# Patient Record
Sex: Female | Born: 1935 | Race: Black or African American | Hispanic: No | Smoking: Never smoker
Health system: Southern US, Community
[De-identification: ages and names within clinical notes are randomized; demographics above are authoritative.]

## PROBLEM LIST (undated history)

## (undated) DIAGNOSIS — K219 Gastro-esophageal reflux disease without esophagitis: Secondary | ICD-10-CM

## (undated) DIAGNOSIS — M6281 Muscle weakness (generalized): Secondary | ICD-10-CM

## (undated) DIAGNOSIS — I10 Essential (primary) hypertension: Secondary | ICD-10-CM

## (undated) DIAGNOSIS — F419 Anxiety disorder, unspecified: Secondary | ICD-10-CM

## (undated) DIAGNOSIS — M1990 Unspecified osteoarthritis, unspecified site: Secondary | ICD-10-CM

## (undated) DIAGNOSIS — R55 Syncope and collapse: Secondary | ICD-10-CM

## (undated) DIAGNOSIS — F0390 Unspecified dementia without behavioral disturbance: Secondary | ICD-10-CM

## (undated) DIAGNOSIS — I219 Acute myocardial infarction, unspecified: Secondary | ICD-10-CM

## (undated) DIAGNOSIS — L0390 Cellulitis, unspecified: Secondary | ICD-10-CM

## (undated) DIAGNOSIS — E785 Hyperlipidemia, unspecified: Secondary | ICD-10-CM

## (undated) DIAGNOSIS — H409 Unspecified glaucoma: Secondary | ICD-10-CM

## (undated) DIAGNOSIS — M109 Gout, unspecified: Secondary | ICD-10-CM

## (undated) DIAGNOSIS — R269 Unspecified abnormalities of gait and mobility: Secondary | ICD-10-CM

## (undated) DIAGNOSIS — I509 Heart failure, unspecified: Secondary | ICD-10-CM

## (undated) DIAGNOSIS — R1310 Dysphagia, unspecified: Secondary | ICD-10-CM

## (undated) DIAGNOSIS — N189 Chronic kidney disease, unspecified: Secondary | ICD-10-CM

## (undated) HISTORY — PX: APPENDECTOMY: SHX54

## (undated) HISTORY — DX: Unspecified abnormalities of gait and mobility: R26.9

## (undated) HISTORY — DX: Chronic kidney disease, unspecified: N18.9

## (undated) HISTORY — DX: Cellulitis, unspecified: L03.90

## (undated) HISTORY — DX: Dysphagia, unspecified: R13.10

## (undated) HISTORY — DX: Heart failure, unspecified: I50.9

## (undated) HISTORY — PX: TONSILLECTOMY: SUR1361

## (undated) HISTORY — DX: Unspecified glaucoma: H40.9

## (undated) HISTORY — DX: Gastro-esophageal reflux disease without esophagitis: K21.9

## (undated) HISTORY — DX: Hyperlipidemia, unspecified: E78.5

## (undated) HISTORY — DX: Gout, unspecified: M10.9

## (undated) HISTORY — DX: Muscle weakness (generalized): M62.81

## (undated) HISTORY — DX: Anxiety disorder, unspecified: F41.9

## (undated) HISTORY — DX: Unspecified osteoarthritis, unspecified site: M19.90

## (undated) HISTORY — DX: Syncope and collapse: R55

## (undated) HISTORY — DX: Essential (primary) hypertension: I10

## (undated) HISTORY — DX: Unspecified dementia, unspecified severity, without behavioral disturbance, psychotic disturbance, mood disturbance, and anxiety: F03.90

---

## 2001-01-12 ENCOUNTER — Ambulatory Visit (HOSPITAL_COMMUNITY): Admission: RE | Admit: 2001-01-12 | Discharge: 2001-01-12 | Payer: Self-pay | Admitting: Family Medicine

## 2001-01-12 ENCOUNTER — Encounter: Payer: Self-pay | Admitting: Family Medicine

## 2001-03-13 ENCOUNTER — Ambulatory Visit (HOSPITAL_COMMUNITY): Admission: RE | Admit: 2001-03-13 | Discharge: 2001-03-13 | Payer: Self-pay | Admitting: Family Medicine

## 2001-03-13 ENCOUNTER — Encounter: Payer: Self-pay | Admitting: Family Medicine

## 2002-01-22 ENCOUNTER — Ambulatory Visit (HOSPITAL_COMMUNITY): Admission: RE | Admit: 2002-01-22 | Discharge: 2002-01-22 | Payer: Self-pay | Admitting: Family Medicine

## 2002-01-22 ENCOUNTER — Encounter: Payer: Self-pay | Admitting: Family Medicine

## 2002-11-19 ENCOUNTER — Ambulatory Visit (HOSPITAL_COMMUNITY): Admission: RE | Admit: 2002-11-19 | Discharge: 2002-11-19 | Payer: Self-pay | Admitting: Family Medicine

## 2002-11-19 ENCOUNTER — Encounter: Payer: Self-pay | Admitting: Family Medicine

## 2002-12-20 ENCOUNTER — Encounter: Payer: Self-pay | Admitting: Internal Medicine

## 2002-12-21 ENCOUNTER — Inpatient Hospital Stay (HOSPITAL_COMMUNITY): Admission: EM | Admit: 2002-12-21 | Discharge: 2002-12-26 | Payer: Self-pay | Admitting: Emergency Medicine

## 2003-03-12 ENCOUNTER — Inpatient Hospital Stay (HOSPITAL_COMMUNITY): Admission: AD | Admit: 2003-03-12 | Discharge: 2003-03-14 | Payer: Self-pay | Admitting: Internal Medicine

## 2003-03-12 ENCOUNTER — Encounter: Payer: Self-pay | Admitting: Internal Medicine

## 2003-07-14 ENCOUNTER — Ambulatory Visit (HOSPITAL_COMMUNITY): Admission: RE | Admit: 2003-07-14 | Discharge: 2003-07-14 | Payer: Self-pay | Admitting: Family Medicine

## 2003-12-03 ENCOUNTER — Ambulatory Visit (HOSPITAL_COMMUNITY): Admission: RE | Admit: 2003-12-03 | Discharge: 2003-12-03 | Payer: Self-pay | Admitting: Family Medicine

## 2003-12-30 ENCOUNTER — Ambulatory Visit (HOSPITAL_COMMUNITY): Admission: RE | Admit: 2003-12-30 | Discharge: 2003-12-30 | Payer: Self-pay | Admitting: Neurology

## 2004-02-24 ENCOUNTER — Inpatient Hospital Stay (HOSPITAL_COMMUNITY): Admission: EM | Admit: 2004-02-24 | Discharge: 2004-02-27 | Payer: Self-pay | Admitting: Emergency Medicine

## 2004-07-08 ENCOUNTER — Ambulatory Visit: Payer: Self-pay | Admitting: Family Medicine

## 2004-07-15 ENCOUNTER — Ambulatory Visit (HOSPITAL_COMMUNITY): Admission: RE | Admit: 2004-07-15 | Discharge: 2004-07-15 | Payer: Self-pay | Admitting: Family Medicine

## 2004-09-20 ENCOUNTER — Ambulatory Visit: Payer: Self-pay | Admitting: Family Medicine

## 2005-01-10 ENCOUNTER — Ambulatory Visit: Payer: Self-pay | Admitting: Family Medicine

## 2005-01-25 ENCOUNTER — Emergency Department (HOSPITAL_COMMUNITY): Admission: EM | Admit: 2005-01-25 | Discharge: 2005-01-25 | Payer: Self-pay | Admitting: Emergency Medicine

## 2005-02-14 ENCOUNTER — Ambulatory Visit: Payer: Self-pay | Admitting: Family Medicine

## 2005-02-14 ENCOUNTER — Ambulatory Visit (HOSPITAL_COMMUNITY): Admission: RE | Admit: 2005-02-14 | Discharge: 2005-02-14 | Payer: Self-pay | Admitting: Family Medicine

## 2005-02-23 ENCOUNTER — Ambulatory Visit: Payer: Self-pay | Admitting: *Deleted

## 2005-03-02 ENCOUNTER — Ambulatory Visit (HOSPITAL_COMMUNITY): Admission: RE | Admit: 2005-03-02 | Discharge: 2005-03-02 | Payer: Self-pay | Admitting: *Deleted

## 2005-03-02 ENCOUNTER — Ambulatory Visit: Payer: Self-pay | Admitting: Cardiology

## 2005-03-03 ENCOUNTER — Ambulatory Visit: Payer: Self-pay | Admitting: *Deleted

## 2005-03-10 ENCOUNTER — Ambulatory Visit: Payer: Self-pay | Admitting: Family Medicine

## 2005-03-21 ENCOUNTER — Ambulatory Visit: Payer: Self-pay | Admitting: Family Medicine

## 2005-03-28 ENCOUNTER — Ambulatory Visit: Payer: Self-pay | Admitting: *Deleted

## 2005-03-30 ENCOUNTER — Ambulatory Visit: Payer: Self-pay | Admitting: *Deleted

## 2005-03-30 ENCOUNTER — Ambulatory Visit (HOSPITAL_COMMUNITY): Admission: RE | Admit: 2005-03-30 | Discharge: 2005-03-30 | Payer: Self-pay | Admitting: *Deleted

## 2005-04-07 ENCOUNTER — Ambulatory Visit: Payer: Self-pay | Admitting: *Deleted

## 2005-05-20 ENCOUNTER — Ambulatory Visit: Payer: Self-pay | Admitting: Family Medicine

## 2005-06-20 ENCOUNTER — Observation Stay (HOSPITAL_COMMUNITY): Admission: EM | Admit: 2005-06-20 | Discharge: 2005-06-22 | Payer: Self-pay | Admitting: Emergency Medicine

## 2005-06-27 ENCOUNTER — Ambulatory Visit: Payer: Self-pay | Admitting: Family Medicine

## 2005-06-29 ENCOUNTER — Ambulatory Visit: Payer: Self-pay | Admitting: Family Medicine

## 2005-07-04 ENCOUNTER — Ambulatory Visit: Payer: Self-pay | Admitting: Family Medicine

## 2005-07-11 ENCOUNTER — Ambulatory Visit: Payer: Self-pay | Admitting: Family Medicine

## 2005-07-11 ENCOUNTER — Observation Stay (HOSPITAL_COMMUNITY): Admission: EM | Admit: 2005-07-11 | Discharge: 2005-07-13 | Payer: Self-pay | Admitting: Emergency Medicine

## 2005-07-29 ENCOUNTER — Ambulatory Visit: Payer: Self-pay | Admitting: Family Medicine

## 2005-08-30 ENCOUNTER — Ambulatory Visit: Payer: Self-pay | Admitting: Family Medicine

## 2005-09-05 ENCOUNTER — Ambulatory Visit (HOSPITAL_COMMUNITY): Admission: RE | Admit: 2005-09-05 | Discharge: 2005-09-05 | Payer: Self-pay | Admitting: Family Medicine

## 2005-10-06 ENCOUNTER — Encounter (INDEPENDENT_AMBULATORY_CARE_PROVIDER_SITE_OTHER): Payer: Self-pay | Admitting: General Surgery

## 2005-10-06 ENCOUNTER — Ambulatory Visit (HOSPITAL_COMMUNITY): Admission: RE | Admit: 2005-10-06 | Discharge: 2005-10-06 | Payer: Self-pay | Admitting: General Surgery

## 2005-10-22 ENCOUNTER — Ambulatory Visit: Payer: Self-pay | Admitting: *Deleted

## 2005-10-22 ENCOUNTER — Inpatient Hospital Stay (HOSPITAL_COMMUNITY): Admission: EM | Admit: 2005-10-22 | Discharge: 2005-10-25 | Payer: Self-pay | Admitting: Emergency Medicine

## 2005-11-10 ENCOUNTER — Ambulatory Visit: Payer: Self-pay | Admitting: Family Medicine

## 2005-11-14 ENCOUNTER — Ambulatory Visit: Payer: Self-pay | Admitting: Family Medicine

## 2005-11-16 ENCOUNTER — Ambulatory Visit: Payer: Self-pay | Admitting: Family Medicine

## 2005-11-18 ENCOUNTER — Ambulatory Visit: Payer: Self-pay | Admitting: Family Medicine

## 2005-11-21 ENCOUNTER — Ambulatory Visit: Payer: Self-pay | Admitting: Family Medicine

## 2005-11-23 ENCOUNTER — Ambulatory Visit: Payer: Self-pay | Admitting: Family Medicine

## 2005-11-25 ENCOUNTER — Ambulatory Visit: Payer: Self-pay | Admitting: Family Medicine

## 2005-12-05 ENCOUNTER — Ambulatory Visit (HOSPITAL_COMMUNITY): Admission: RE | Admit: 2005-12-05 | Discharge: 2005-12-05 | Payer: Self-pay | Admitting: Nephrology

## 2005-12-13 ENCOUNTER — Ambulatory Visit: Payer: Self-pay | Admitting: Family Medicine

## 2006-01-03 ENCOUNTER — Inpatient Hospital Stay (HOSPITAL_COMMUNITY): Admission: EM | Admit: 2006-01-03 | Discharge: 2006-01-06 | Payer: Self-pay | Admitting: Emergency Medicine

## 2006-01-03 ENCOUNTER — Ambulatory Visit: Payer: Self-pay | Admitting: *Deleted

## 2006-01-24 ENCOUNTER — Ambulatory Visit: Payer: Self-pay | Admitting: Family Medicine

## 2006-03-08 ENCOUNTER — Ambulatory Visit: Payer: Self-pay | Admitting: Family Medicine

## 2006-03-08 ENCOUNTER — Other Ambulatory Visit: Admission: RE | Admit: 2006-03-08 | Discharge: 2006-03-08 | Payer: Self-pay | Admitting: Family Medicine

## 2006-04-26 ENCOUNTER — Ambulatory Visit: Payer: Self-pay | Admitting: Family Medicine

## 2006-06-06 ENCOUNTER — Ambulatory Visit: Payer: Self-pay | Admitting: Family Medicine

## 2006-06-14 ENCOUNTER — Ambulatory Visit: Payer: Self-pay | Admitting: Family Medicine

## 2006-06-28 ENCOUNTER — Ambulatory Visit: Payer: Self-pay | Admitting: Family Medicine

## 2006-09-01 ENCOUNTER — Ambulatory Visit: Payer: Self-pay | Admitting: Family Medicine

## 2006-09-12 ENCOUNTER — Ambulatory Visit: Payer: Self-pay | Admitting: Family Medicine

## 2006-10-06 ENCOUNTER — Emergency Department (HOSPITAL_COMMUNITY): Admission: EM | Admit: 2006-10-06 | Discharge: 2006-10-06 | Payer: Self-pay | Admitting: Emergency Medicine

## 2006-10-18 ENCOUNTER — Ambulatory Visit: Payer: Self-pay | Admitting: Family Medicine

## 2006-11-30 ENCOUNTER — Ambulatory Visit: Payer: Self-pay | Admitting: Family Medicine

## 2006-12-05 ENCOUNTER — Ambulatory Visit (HOSPITAL_COMMUNITY): Admission: RE | Admit: 2006-12-05 | Discharge: 2006-12-05 | Payer: Self-pay | Admitting: Family Medicine

## 2006-12-19 ENCOUNTER — Ambulatory Visit: Payer: Self-pay | Admitting: Family Medicine

## 2007-01-02 ENCOUNTER — Ambulatory Visit: Payer: Self-pay | Admitting: Family Medicine

## 2007-01-30 ENCOUNTER — Ambulatory Visit: Payer: Self-pay | Admitting: Family Medicine

## 2007-01-30 LAB — HM MAMMOGRAPHY

## 2007-03-06 ENCOUNTER — Ambulatory Visit: Payer: Self-pay | Admitting: Family Medicine

## 2007-05-01 ENCOUNTER — Ambulatory Visit: Payer: Self-pay | Admitting: Family Medicine

## 2007-05-07 ENCOUNTER — Ambulatory Visit: Payer: Self-pay | Admitting: Family Medicine

## 2007-05-21 ENCOUNTER — Ambulatory Visit: Payer: Self-pay | Admitting: Family Medicine

## 2007-06-11 ENCOUNTER — Ambulatory Visit: Payer: Self-pay | Admitting: Family Medicine

## 2007-06-27 ENCOUNTER — Ambulatory Visit: Payer: Self-pay | Admitting: Family Medicine

## 2007-08-06 ENCOUNTER — Ambulatory Visit: Payer: Self-pay | Admitting: Family Medicine

## 2007-08-08 ENCOUNTER — Ambulatory Visit: Payer: Self-pay | Admitting: Family Medicine

## 2007-08-08 LAB — CONVERTED CEMR LAB
CO2: 33 meq/L — ABNORMAL HIGH (ref 19–32)
Calcium: 10.1 mg/dL (ref 8.4–10.5)
Creatinine, Ser: 1.03 mg/dL (ref 0.40–1.20)
Glucose, Bld: 80 mg/dL (ref 70–99)

## 2007-08-13 ENCOUNTER — Ambulatory Visit: Payer: Self-pay | Admitting: Cardiology

## 2007-08-15 ENCOUNTER — Emergency Department (HOSPITAL_COMMUNITY): Admission: EM | Admit: 2007-08-15 | Discharge: 2007-08-15 | Payer: Self-pay | Admitting: Emergency Medicine

## 2007-08-29 ENCOUNTER — Ambulatory Visit: Payer: Self-pay | Admitting: Family Medicine

## 2007-08-30 ENCOUNTER — Encounter: Payer: Self-pay | Admitting: Family Medicine

## 2007-10-09 ENCOUNTER — Emergency Department (HOSPITAL_COMMUNITY): Admission: EM | Admit: 2007-10-09 | Discharge: 2007-10-09 | Payer: Self-pay | Admitting: Emergency Medicine

## 2007-10-17 ENCOUNTER — Ambulatory Visit: Payer: Self-pay | Admitting: Family Medicine

## 2007-10-26 ENCOUNTER — Ambulatory Visit: Payer: Self-pay | Admitting: Family Medicine

## 2007-10-26 LAB — CONVERTED CEMR LAB
ALT: 11 units/L (ref 0–35)
Albumin: 4.1 g/dL (ref 3.5–5.2)
Alkaline Phosphatase: 113 units/L (ref 39–117)
Basophils Absolute: 0 10*3/uL (ref 0.0–0.1)
Basophils Relative: 0 % (ref 0–1)
Chloride: 96 meq/L (ref 96–112)
Cholesterol: 207 mg/dL — ABNORMAL HIGH (ref 0–200)
Creatinine, Ser: 0.97 mg/dL (ref 0.40–1.20)
Eosinophils Absolute: 0.2 10*3/uL (ref 0.0–0.7)
HDL: 99 mg/dL (ref >39)
Indirect Bilirubin: 0.2 mg/dL (ref 0.0–0.9)
LDL Cholesterol: 72 mg/dL (ref 0–99)
MCHC: 33.5 g/dL (ref 30.0–36.0)
MCV: 91.8 fL (ref 78.0–100.0)
Monocytes Absolute: 0.7 10*3/uL (ref 0.1–1.0)
Neutro Abs: 2.5 10*3/uL (ref 1.7–7.7)
Neutrophils Relative %: 48 % (ref 43–77)
Potassium: 3.5 meq/L (ref 3.5–5.3)
RDW: 14.6 % (ref 11.5–15.5)
Total Protein: 7 g/dL (ref 6.0–8.3)
Triglycerides: 179 mg/dL — ABNORMAL HIGH (ref <150)

## 2007-11-07 ENCOUNTER — Encounter: Payer: Self-pay | Admitting: Family Medicine

## 2007-11-07 DIAGNOSIS — F03918 Unspecified dementia, unspecified severity, with other behavioral disturbance: Secondary | ICD-10-CM | POA: Insufficient documentation

## 2007-11-07 DIAGNOSIS — F411 Generalized anxiety disorder: Secondary | ICD-10-CM | POA: Insufficient documentation

## 2007-11-07 DIAGNOSIS — I10 Essential (primary) hypertension: Secondary | ICD-10-CM | POA: Insufficient documentation

## 2007-11-07 DIAGNOSIS — M1990 Unspecified osteoarthritis, unspecified site: Secondary | ICD-10-CM | POA: Insufficient documentation

## 2007-11-07 DIAGNOSIS — F0391 Unspecified dementia with behavioral disturbance: Secondary | ICD-10-CM | POA: Insufficient documentation

## 2007-11-07 DIAGNOSIS — E785 Hyperlipidemia, unspecified: Secondary | ICD-10-CM | POA: Insufficient documentation

## 2007-11-09 ENCOUNTER — Ambulatory Visit: Payer: Self-pay | Admitting: Family Medicine

## 2007-11-29 ENCOUNTER — Ambulatory Visit: Payer: Self-pay | Admitting: Family Medicine

## 2007-12-31 ENCOUNTER — Ambulatory Visit: Payer: Self-pay | Admitting: Family Medicine

## 2008-01-30 ENCOUNTER — Ambulatory Visit: Payer: Self-pay | Admitting: Family Medicine

## 2008-02-04 ENCOUNTER — Ambulatory Visit: Payer: Self-pay | Admitting: Family Medicine

## 2008-02-12 ENCOUNTER — Ambulatory Visit: Payer: Self-pay | Admitting: Family Medicine

## 2008-02-12 ENCOUNTER — Ambulatory Visit (HOSPITAL_COMMUNITY): Admission: RE | Admit: 2008-02-12 | Discharge: 2008-02-12 | Payer: Self-pay | Admitting: Family Medicine

## 2008-02-12 LAB — CONVERTED CEMR LAB
Basophils Absolute: 0 10*3/uL (ref 0.0–0.1)
Basophils Relative: 0 % (ref 0–1)
Eosinophils Absolute: 0.2 10*3/uL (ref 0.0–0.7)
Eosinophils Relative: 3 % (ref 0–5)
HCT: 37.9 % (ref 36.0–46.0)
Lymphs Abs: 2.9 10*3/uL (ref 0.7–4.0)
MCHC: 32.2 g/dL (ref 30.0–36.0)
MCV: 89.4 fL (ref 78.0–100.0)
Neutrophils Relative %: 42 % — ABNORMAL LOW (ref 43–77)
Platelets: 244 10*3/uL (ref 150–400)
RDW: 15.7 % — ABNORMAL HIGH (ref 11.5–15.5)
TSH: 1.098 microintl units/mL (ref 0.350–5.50)
WBC: 6.5 10*3/uL (ref 4.0–10.5)

## 2008-03-13 ENCOUNTER — Ambulatory Visit: Payer: Self-pay | Admitting: Family Medicine

## 2008-03-13 LAB — CONVERTED CEMR LAB
ALT: 26 units/L (ref 0–35)
AST: 22 units/L (ref 0–37)
Albumin: 4.4 g/dL (ref 3.5–5.2)
Alkaline Phosphatase: 113 units/L (ref 39–117)
CO2: 29 meq/L (ref 19–32)
Calcium: 9.7 mg/dL (ref 8.4–10.5)
Cholesterol: 238 mg/dL — ABNORMAL HIGH (ref 0–200)
Creatinine, Ser: 1.1 mg/dL (ref 0.40–1.20)
HDL: 122 mg/dL (ref >39)
Sodium: 143 meq/L (ref 135–145)
Total Bilirubin: 0.4 mg/dL (ref 0.3–1.2)
Total CHOL/HDL Ratio: 2
Triglycerides: 119 mg/dL (ref <150)

## 2008-03-27 ENCOUNTER — Telehealth: Payer: Self-pay | Admitting: Family Medicine

## 2008-04-08 ENCOUNTER — Ambulatory Visit: Payer: Self-pay | Admitting: Family Medicine

## 2008-04-21 ENCOUNTER — Encounter: Payer: Self-pay | Admitting: Family Medicine

## 2008-05-13 ENCOUNTER — Encounter: Payer: Self-pay | Admitting: Family Medicine

## 2008-05-14 ENCOUNTER — Encounter: Payer: Self-pay | Admitting: Family Medicine

## 2008-05-15 ENCOUNTER — Ambulatory Visit: Payer: Self-pay | Admitting: Family Medicine

## 2008-05-29 ENCOUNTER — Ambulatory Visit: Payer: Self-pay | Admitting: Family Medicine

## 2008-06-10 ENCOUNTER — Encounter: Payer: Self-pay | Admitting: Family Medicine

## 2008-06-17 ENCOUNTER — Encounter: Payer: Self-pay | Admitting: Family Medicine

## 2008-07-17 ENCOUNTER — Ambulatory Visit: Payer: Self-pay | Admitting: Family Medicine

## 2008-07-17 LAB — CONVERTED CEMR LAB
ALT: 15 units/L (ref 0–35)
AST: 16 units/L (ref 0–37)
Alkaline Phosphatase: 106 units/L (ref 39–117)
Bilirubin, Direct: 0.1 mg/dL (ref 0.0–0.3)
CO2: 22 meq/L (ref 19–32)
Calcium: 9.5 mg/dL (ref 8.4–10.5)
Cholesterol: 237 mg/dL — ABNORMAL HIGH (ref 0–200)
Creatinine, Ser: 1.08 mg/dL (ref 0.40–1.20)
Glucose, Bld: 72 mg/dL (ref 70–99)
HDL: 105 mg/dL (ref >39)
Sodium: 141 meq/L (ref 135–145)
Total Bilirubin: 0.4 mg/dL (ref 0.3–1.2)
Total CHOL/HDL Ratio: 2.3

## 2008-07-22 ENCOUNTER — Encounter: Payer: Self-pay | Admitting: Family Medicine

## 2008-07-31 ENCOUNTER — Ambulatory Visit: Payer: Self-pay | Admitting: Family Medicine

## 2008-08-01 ENCOUNTER — Encounter: Payer: Self-pay | Admitting: Family Medicine

## 2008-08-04 ENCOUNTER — Encounter: Payer: Self-pay | Admitting: Family Medicine

## 2008-08-15 ENCOUNTER — Encounter: Payer: Self-pay | Admitting: Family Medicine

## 2008-08-18 ENCOUNTER — Encounter: Payer: Self-pay | Admitting: Family Medicine

## 2008-08-26 ENCOUNTER — Encounter: Payer: Self-pay | Admitting: Family Medicine

## 2008-09-08 ENCOUNTER — Encounter: Payer: Self-pay | Admitting: Family Medicine

## 2008-09-09 ENCOUNTER — Ambulatory Visit: Payer: Self-pay | Admitting: Family Medicine

## 2008-09-17 ENCOUNTER — Ambulatory Visit: Payer: Self-pay | Admitting: Family Medicine

## 2008-09-17 DIAGNOSIS — M549 Dorsalgia, unspecified: Secondary | ICD-10-CM | POA: Insufficient documentation

## 2008-09-17 LAB — CONVERTED CEMR LAB
ALT: 15 units/L (ref 0–35)
AST: 18 units/L (ref 0–37)
Alkaline Phosphatase: 105 units/L (ref 39–117)
Bilirubin, Direct: 0.1 mg/dL (ref 0.0–0.3)
CO2: 29 meq/L (ref 19–32)
Calcium: 9.2 mg/dL (ref 8.4–10.5)
Cholesterol: 203 mg/dL — ABNORMAL HIGH (ref 0–200)
Creatinine, Ser: 1.66 mg/dL — ABNORMAL HIGH (ref 0.40–1.20)
Glucose, Bld: 76 mg/dL (ref 70–99)
Indirect Bilirubin: 0.3 mg/dL (ref 0.0–0.9)
Total Bilirubin: 0.4 mg/dL (ref 0.3–1.2)

## 2008-10-01 ENCOUNTER — Encounter: Payer: Self-pay | Admitting: Family Medicine

## 2008-10-10 ENCOUNTER — Encounter: Payer: Self-pay | Admitting: Family Medicine

## 2008-10-13 ENCOUNTER — Ambulatory Visit: Payer: Self-pay | Admitting: Family Medicine

## 2008-10-14 ENCOUNTER — Encounter: Payer: Self-pay | Admitting: Family Medicine

## 2008-10-27 ENCOUNTER — Encounter: Payer: Self-pay | Admitting: Family Medicine

## 2008-11-10 ENCOUNTER — Encounter: Payer: Self-pay | Admitting: Family Medicine

## 2008-11-18 ENCOUNTER — Encounter: Payer: Self-pay | Admitting: Family Medicine

## 2008-12-15 ENCOUNTER — Ambulatory Visit: Payer: Self-pay | Admitting: Family Medicine

## 2008-12-16 ENCOUNTER — Encounter: Payer: Self-pay | Admitting: Family Medicine

## 2008-12-17 ENCOUNTER — Ambulatory Visit: Payer: Self-pay | Admitting: Family Medicine

## 2008-12-17 DIAGNOSIS — R5383 Other fatigue: Secondary | ICD-10-CM | POA: Insufficient documentation

## 2008-12-17 DIAGNOSIS — R5381 Other malaise: Secondary | ICD-10-CM

## 2008-12-21 DIAGNOSIS — J3089 Other allergic rhinitis: Secondary | ICD-10-CM | POA: Insufficient documentation

## 2008-12-31 ENCOUNTER — Encounter: Payer: Self-pay | Admitting: Family Medicine

## 2009-01-27 ENCOUNTER — Encounter: Payer: Self-pay | Admitting: Family Medicine

## 2009-01-28 ENCOUNTER — Encounter: Payer: Self-pay | Admitting: Family Medicine

## 2009-02-05 ENCOUNTER — Ambulatory Visit: Payer: Self-pay | Admitting: Family Medicine

## 2009-02-17 ENCOUNTER — Encounter: Payer: Self-pay | Admitting: Family Medicine

## 2009-02-18 ENCOUNTER — Encounter: Payer: Self-pay | Admitting: Family Medicine

## 2009-02-19 ENCOUNTER — Ambulatory Visit: Payer: Self-pay | Admitting: Family Medicine

## 2009-03-12 ENCOUNTER — Ambulatory Visit (HOSPITAL_COMMUNITY): Admission: RE | Admit: 2009-03-12 | Discharge: 2009-03-12 | Payer: Self-pay | Admitting: Family Medicine

## 2009-03-12 ENCOUNTER — Encounter: Payer: Self-pay | Admitting: Family Medicine

## 2009-03-12 ENCOUNTER — Ambulatory Visit: Payer: Self-pay | Admitting: Family Medicine

## 2009-03-25 ENCOUNTER — Ambulatory Visit: Payer: Self-pay | Admitting: Family Medicine

## 2009-04-23 ENCOUNTER — Encounter: Payer: Self-pay | Admitting: Family Medicine

## 2009-04-29 ENCOUNTER — Encounter: Payer: Self-pay | Admitting: Family Medicine

## 2009-05-21 ENCOUNTER — Ambulatory Visit (HOSPITAL_COMMUNITY): Admission: RE | Admit: 2009-05-21 | Discharge: 2009-05-21 | Payer: Self-pay | Admitting: Family Medicine

## 2009-05-21 ENCOUNTER — Ambulatory Visit: Payer: Self-pay | Admitting: Family Medicine

## 2009-05-21 DIAGNOSIS — L84 Corns and callosities: Secondary | ICD-10-CM | POA: Insufficient documentation

## 2009-05-21 LAB — CONVERTED CEMR LAB
Specific Gravity, Urine: 1.028 (ref 1.005–1.030)
pH: 7.5 (ref 5.0–8.0)

## 2009-05-22 LAB — CONVERTED CEMR LAB
Alkaline Phosphatase: 120 units/L — ABNORMAL HIGH (ref 39–117)
BUN: 16 mg/dL (ref 6–23)
Basophils Absolute: 0 10*3/uL (ref 0.0–0.1)
Basophils Relative: 0 % (ref 0–1)
Calcium: 8.9 mg/dL (ref 8.4–10.5)
Chloride: 101 meq/L (ref 96–112)
Creatinine, Ser: 1.2 mg/dL (ref 0.40–1.20)
Eosinophils Absolute: 0.2 10*3/uL (ref 0.0–0.7)
Eosinophils Relative: 3 % (ref 0–5)
HCT: 36.7 % (ref 36.0–46.0)
Indirect Bilirubin: 0.3 mg/dL (ref 0.0–0.9)
LDL Cholesterol: 109 mg/dL — ABNORMAL HIGH (ref 0–99)
Lymphs Abs: 2 10*3/uL (ref 0.7–4.0)
MCHC: 32.2 g/dL (ref 30.0–36.0)
MCV: 92 fL (ref 78.0–100.0)
Platelets: 247 10*3/uL (ref 150–400)
RDW: 14.8 % (ref 11.5–15.5)
Total Bilirubin: 0.4 mg/dL (ref 0.3–1.2)
Total Protein: 7 g/dL (ref 6.0–8.3)
Triglycerides: 132 mg/dL (ref <150)

## 2009-05-27 ENCOUNTER — Telehealth: Payer: Self-pay | Admitting: Family Medicine

## 2009-06-03 ENCOUNTER — Emergency Department (HOSPITAL_COMMUNITY): Admission: EM | Admit: 2009-06-03 | Discharge: 2009-06-03 | Payer: Self-pay | Admitting: Emergency Medicine

## 2009-06-04 ENCOUNTER — Telehealth: Payer: Self-pay | Admitting: Family Medicine

## 2009-06-11 ENCOUNTER — Ambulatory Visit: Payer: Self-pay | Admitting: Family Medicine

## 2009-06-12 ENCOUNTER — Encounter: Payer: Self-pay | Admitting: Family Medicine

## 2009-06-24 ENCOUNTER — Telehealth: Payer: Self-pay | Admitting: Family Medicine

## 2009-06-29 ENCOUNTER — Ambulatory Visit: Payer: Self-pay | Admitting: Family Medicine

## 2009-06-30 ENCOUNTER — Telehealth: Payer: Self-pay | Admitting: Family Medicine

## 2009-07-05 DIAGNOSIS — F418 Other specified anxiety disorders: Secondary | ICD-10-CM | POA: Insufficient documentation

## 2009-07-29 ENCOUNTER — Ambulatory Visit: Payer: Self-pay | Admitting: Family Medicine

## 2009-08-04 ENCOUNTER — Telehealth: Payer: Self-pay | Admitting: Family Medicine

## 2009-08-17 ENCOUNTER — Ambulatory Visit: Payer: Self-pay | Admitting: Family Medicine

## 2009-08-17 DIAGNOSIS — R609 Edema, unspecified: Secondary | ICD-10-CM | POA: Insufficient documentation

## 2009-08-27 ENCOUNTER — Encounter: Payer: Self-pay | Admitting: Family Medicine

## 2009-09-01 LAB — CONVERTED CEMR LAB
ALT: 12 units/L (ref 0–35)
AST: 14 units/L (ref 0–37)
Albumin: 3.9 g/dL (ref 3.5–5.2)
Calcium: 9.7 mg/dL (ref 8.4–10.5)
Cholesterol: 284 mg/dL — ABNORMAL HIGH (ref 0–200)
HDL: 114 mg/dL (ref >39)
Sodium: 143 meq/L (ref 135–145)
Total CHOL/HDL Ratio: 2.5

## 2009-09-11 ENCOUNTER — Telehealth: Payer: Self-pay | Admitting: Family Medicine

## 2009-09-29 ENCOUNTER — Encounter: Payer: Self-pay | Admitting: Family Medicine

## 2009-10-02 ENCOUNTER — Encounter: Payer: Self-pay | Admitting: Family Medicine

## 2009-10-08 ENCOUNTER — Ambulatory Visit: Payer: Self-pay | Admitting: Family Medicine

## 2009-10-12 ENCOUNTER — Encounter: Payer: Self-pay | Admitting: Family Medicine

## 2009-10-15 ENCOUNTER — Telehealth: Payer: Self-pay | Admitting: Physician Assistant

## 2009-10-19 ENCOUNTER — Ambulatory Visit: Payer: Self-pay | Admitting: Family Medicine

## 2009-10-23 ENCOUNTER — Encounter: Payer: Self-pay | Admitting: Family Medicine

## 2009-10-27 ENCOUNTER — Telehealth: Payer: Self-pay | Admitting: Family Medicine

## 2009-11-05 ENCOUNTER — Encounter: Payer: Self-pay | Admitting: Family Medicine

## 2009-11-09 ENCOUNTER — Ambulatory Visit: Payer: Self-pay | Admitting: Family Medicine

## 2009-11-10 ENCOUNTER — Telehealth: Payer: Self-pay | Admitting: Family Medicine

## 2009-11-10 ENCOUNTER — Encounter: Payer: Self-pay | Admitting: Family Medicine

## 2009-11-12 ENCOUNTER — Telehealth: Payer: Self-pay | Admitting: Family Medicine

## 2009-11-26 ENCOUNTER — Inpatient Hospital Stay (HOSPITAL_COMMUNITY)
Admission: EM | Admit: 2009-11-26 | Discharge: 2009-12-01 | Payer: Self-pay | Source: Home / Self Care | Admitting: Emergency Medicine

## 2009-11-26 ENCOUNTER — Telehealth: Payer: Self-pay | Admitting: Family Medicine

## 2009-11-26 ENCOUNTER — Ambulatory Visit: Payer: Self-pay | Admitting: Cardiology

## 2009-11-27 ENCOUNTER — Encounter (INDEPENDENT_AMBULATORY_CARE_PROVIDER_SITE_OTHER): Payer: Self-pay | Admitting: Internal Medicine

## 2009-12-21 ENCOUNTER — Ambulatory Visit: Payer: Self-pay | Admitting: Family Medicine

## 2009-12-22 ENCOUNTER — Encounter: Payer: Self-pay | Admitting: Family Medicine

## 2009-12-29 ENCOUNTER — Encounter: Payer: Self-pay | Admitting: Family Medicine

## 2009-12-31 ENCOUNTER — Telehealth: Payer: Self-pay | Admitting: Family Medicine

## 2010-01-11 ENCOUNTER — Telehealth: Payer: Self-pay | Admitting: Family Medicine

## 2010-01-12 ENCOUNTER — Ambulatory Visit: Payer: Self-pay | Admitting: Family Medicine

## 2010-01-12 DIAGNOSIS — M109 Gout, unspecified: Secondary | ICD-10-CM | POA: Insufficient documentation

## 2010-01-12 HISTORY — DX: Gout, unspecified: M10.9

## 2010-02-02 ENCOUNTER — Encounter: Payer: Self-pay | Admitting: Family Medicine

## 2010-02-11 ENCOUNTER — Encounter: Payer: Self-pay | Admitting: Family Medicine

## 2010-02-25 ENCOUNTER — Encounter: Payer: Self-pay | Admitting: Family Medicine

## 2010-02-26 ENCOUNTER — Telehealth: Payer: Self-pay | Admitting: Family Medicine

## 2010-03-04 ENCOUNTER — Ambulatory Visit: Payer: Self-pay | Admitting: Family Medicine

## 2010-03-04 LAB — CONVERTED CEMR LAB
Calcium: 9.8 mg/dL (ref 8.4–10.5)
Glucose, Bld: 92 mg/dL (ref 70–99)
Sodium: 140 meq/L (ref 135–145)
Uric Acid, Serum: 11.5 mg/dL — ABNORMAL HIGH (ref 2.4–7.0)

## 2010-03-08 ENCOUNTER — Ambulatory Visit: Payer: Self-pay | Admitting: Family Medicine

## 2010-03-10 ENCOUNTER — Ambulatory Visit: Payer: Self-pay | Admitting: Family Medicine

## 2010-03-10 LAB — CONVERTED CEMR LAB
Albumin: 3.9 g/dL (ref 3.5–5.2)
Basophils Relative: 0 % (ref 0–1)
Chloride: 96 meq/L (ref 96–112)
Creatinine, Ser: 2.03 mg/dL — ABNORMAL HIGH (ref 0.40–1.20)
HDL: 70 mg/dL (ref >39)
Hemoglobin: 11.2 g/dL — ABNORMAL LOW (ref 12.0–15.0)
LDL Cholesterol: 108 mg/dL — ABNORMAL HIGH (ref 0–99)
Lymphs Abs: 2 10*3/uL (ref 0.7–4.0)
MCHC: 32.4 g/dL (ref 30.0–36.0)
Monocytes Absolute: 0.5 10*3/uL (ref 0.1–1.0)
Monocytes Relative: 12 % (ref 3–12)
Neutro Abs: 1.2 10*3/uL — ABNORMAL LOW (ref 1.7–7.7)
Potassium: 3.8 meq/L (ref 3.5–5.3)
RBC: 3.95 M/uL (ref 3.87–5.11)
Total Bilirubin: 0.4 mg/dL (ref 0.3–1.2)
Total CHOL/HDL Ratio: 2.9
Total Protein: 6.9 g/dL (ref 6.0–8.3)
VLDL: 24 mg/dL (ref 0–40)
WBC: 4 10*3/uL (ref 4.0–10.5)

## 2010-04-14 LAB — CONVERTED CEMR LAB
CO2: 31 meq/L (ref 19–32)
Chloride: 100 meq/L (ref 96–112)
Potassium: 3.6 meq/L (ref 3.5–5.3)
Sodium: 141 meq/L (ref 135–145)

## 2010-04-22 ENCOUNTER — Ambulatory Visit: Payer: Self-pay | Admitting: Family Medicine

## 2010-05-04 DIAGNOSIS — K219 Gastro-esophageal reflux disease without esophagitis: Secondary | ICD-10-CM | POA: Insufficient documentation

## 2010-05-05 ENCOUNTER — Ambulatory Visit: Payer: Self-pay | Admitting: Family Medicine

## 2010-05-11 ENCOUNTER — Ambulatory Visit: Payer: Self-pay | Admitting: Family Medicine

## 2010-06-11 ENCOUNTER — Ambulatory Visit: Payer: Self-pay | Admitting: Family Medicine

## 2010-06-28 ENCOUNTER — Encounter: Payer: Self-pay | Admitting: Family Medicine

## 2010-07-06 ENCOUNTER — Telehealth: Payer: Self-pay | Admitting: Family Medicine

## 2010-07-06 LAB — CONVERTED CEMR LAB
Albumin: 4.1 g/dL (ref 3.5–5.2)
Alkaline Phosphatase: 109 units/L (ref 39–117)
BUN: 14 mg/dL (ref 6–23)
CO2: 32 meq/L (ref 19–32)
Chloride: 99 meq/L (ref 96–112)
Creatinine, Ser: 1.22 mg/dL — ABNORMAL HIGH (ref 0.40–1.20)
Indirect Bilirubin: 0.4 mg/dL (ref 0.0–0.9)
LDL Cholesterol: 83 mg/dL (ref 0–99)
Potassium: 3.3 meq/L — ABNORMAL LOW (ref 3.5–5.3)
Total Bilirubin: 0.5 mg/dL (ref 0.3–1.2)
VLDL: 17 mg/dL (ref 0–40)

## 2010-07-08 ENCOUNTER — Ambulatory Visit: Payer: Self-pay | Admitting: Family Medicine

## 2010-07-08 DIAGNOSIS — R1310 Dysphagia, unspecified: Secondary | ICD-10-CM | POA: Insufficient documentation

## 2010-07-09 ENCOUNTER — Telehealth (INDEPENDENT_AMBULATORY_CARE_PROVIDER_SITE_OTHER): Payer: Self-pay | Admitting: *Deleted

## 2010-08-03 ENCOUNTER — Ambulatory Visit: Payer: Self-pay | Admitting: Internal Medicine

## 2010-08-04 ENCOUNTER — Encounter: Payer: Self-pay | Admitting: Family Medicine

## 2010-08-19 ENCOUNTER — Ambulatory Visit: Payer: Self-pay | Admitting: Family Medicine

## 2010-09-01 ENCOUNTER — Ambulatory Visit (HOSPITAL_COMMUNITY)
Admission: RE | Admit: 2010-09-01 | Discharge: 2010-09-01 | Payer: Self-pay | Source: Home / Self Care | Attending: Internal Medicine | Admitting: Internal Medicine

## 2010-09-01 ENCOUNTER — Ambulatory Visit: Admit: 2010-09-01 | Payer: Self-pay | Admitting: Internal Medicine

## 2010-09-06 ENCOUNTER — Ambulatory Visit
Admission: RE | Admit: 2010-09-06 | Discharge: 2010-09-06 | Payer: Self-pay | Source: Home / Self Care | Attending: Family Medicine | Admitting: Family Medicine

## 2010-09-07 ENCOUNTER — Encounter: Payer: Self-pay | Admitting: Family Medicine

## 2010-09-08 ENCOUNTER — Encounter: Payer: Self-pay | Admitting: Family Medicine

## 2010-09-19 ENCOUNTER — Encounter: Payer: Self-pay | Admitting: Family Medicine

## 2010-09-23 ENCOUNTER — Encounter: Payer: Self-pay | Admitting: Family Medicine

## 2010-09-27 ENCOUNTER — Encounter: Payer: Self-pay | Admitting: Family Medicine

## 2010-09-27 ENCOUNTER — Telehealth: Payer: Self-pay | Admitting: Family Medicine

## 2010-09-28 NOTE — Assessment & Plan Note (Signed)
Summary: RECERT   Allergies: 1)  ! Asa   Complete Medication List: 1)  Aricept 10 Mg Tabs (Donepezil hcl) .... Take 1 tablet by mouth once a day 2)  Plavix 75 Mg Tabs (Clopidogrel bisulfate) .... Take 1 tablet by mouth once a day 3)  Zetia 10 Mg Tabs (Ezetimibe) .... Take 1 tablet by mouth once a day 4)  Oyster Shell Calcium + D 500-200 Mg-unit Tabs (Calcium carbonate-vitamin d) .... Take 1 tablet by mouth three times a day 5)  Nitroquick 0.4 Mg Subl (Nitroglycerin) .... Uad 6)  Omeprazole 20 Mg Tbec (Omeprazole) .... Take 1 tablet by mouth two times a day 7)  Diazepam 5 Mg Tabs (Diazepam) .... Take 1 tablet by mouth three times a day 8)  Benazepril-hydrochlorothiazide 20-25 Mg Tabs (Benazepril-hydrochlorothiazide) .... Take one tab daily by mouth once daily for bp 9)  Citalopram Hydrobromide 20 Mg Tabs (Citalopram hydrobromide) .... One tab by mouth qd 10)  Lovastatin 40 Mg Tabs (Lovastatin) .... Two tabs po  by mouth at bedtime 11)  Flonase 50 Mcg/act Susp (Fluticasone propionate) .Marland Kitchen.. 1 puff per nostril daily 12)  Lactulose 10 Gm/57ml Soln (Lactulose) .... One tablespoon every four days as needed for constipation 13)  Tessalon Perles 100 Mg Caps (Benzonatate) .... Take 1 tablet by mouth three times a day 14)  Tramadol Hcl 50 Mg Tabs (Tramadol hcl) .... Take 1 tablet by mouth three times a day 15)  Furosemide 20 Mg Tabs (Furosemide) .... One in the morning and aone at 2 pm 16)  Silvadene 1 % Crea (Silver sulfadiazine) .... Apply once daily as directed 17)  Doxycycline Hyclate 100 Mg Tabs (Doxycycline hyclate) .... Take 1 capsule by mouth two times a day 18)  Duoderm  .... Apply 1 patch every 3 days until healed for stage 2 ulcer to buttocks recertification form reviewed and signed

## 2010-09-28 NOTE — Progress Notes (Signed)
Summary: BOTTOM BROKEN OUT  Phone Note Call from Patient   Summary of Call: [name]  CALLED AND SAID THAT [name] HAS A BROKEN PLACE ON HER BOTTOM  IS THERE ANYTHING SHE CAN GET OTC OR SOMETHING THTA COULD BE CALLE IN AT [facility] BACK AT [phone] Initial call taken by: Lind Guest,  October 15, 2009 9:55 AM  Follow-up for Phone Call        ms Resler says it isnt a rash, it is a sore that had a scab Follow-up by: Adella Hare LPN,  October 15, 2009 5:16 PM  Additional Follow-up for Phone Call Additional follow up Details #1::        call pharm & see if they carry Calmoseptine.  It is an OTC cream that should help.   Additional Follow-up by: Esperanza Sheets PA,  October 16, 2009 8:11 AM    Additional Follow-up for Phone Call Additional follow up Details #2::    pharmacy does not carry this any other suggestions? Follow-up by: Adella Hare LPN,  October 16, 2009 10:27 AM  Additional Follow-up for Phone Call Additional follow up Details #3:: Details for Additional Follow-up Action Taken: Need to wash the area daily with mild soap & water.  Then allow the area to dry completely.  Frequent change of positions to avoid pressure on this area.  Apply Silvadene cream & a dressing/bandage to cover after wash the area.  Appt next wk if not improving, & definitely if worsening.  I already E Rx'd the Silvadene & sent it to the pharmacy. Additional Follow-up by: Esperanza Sheets PA,  October 16, 2009 11:10 AM  New/Updated Medications: SILVADENE 1 % CREA (SILVER SULFADIAZINE) apply once daily as directed Prescriptions: SILVADENE 1 % CREA (SILVER SULFADIAZINE) apply once daily as directed  #20 gm x 0   Entered and Authorized by:   Esperanza Sheets PA   Signed by:   Esperanza Sheets PA on 10/16/2009   Method used:   Electronically to        [facility] (retail)       [address]       [place], [place]  [address]       Ph: [phone]   Fax: [phone]   RxID:   [id number]  patient aware and will have aide call for direction

## 2010-09-28 NOTE — Progress Notes (Signed)
Summary: talk with nurse  Phone Note Call from Patient   Summary of Call: please give her a call.  about a oxygen tank.  [phone] Initial call taken by: Rudene Anda,  September 11, 2009 11:55 AM  Follow-up for Phone Call        returned call, left message Follow-up by: Worthy Keeler LPN,  September 14, 2009 3:05 PM  Additional Follow-up for Phone Call Additional follow up Details #1::        caretaker was calling to advise they had gotten everything squared away with the oxygen tank Additional Follow-up by: Worthy Keeler LPN,  September 15, 2009 1:11 PM

## 2010-09-28 NOTE — Medication Information (Signed)
Summary: Tax adviser   Imported By: Lind Guest 02/02/2010 14:45:44  _____________________________________________________________________  External Attachment:    Type:   Image     Comment:   External Document

## 2010-09-28 NOTE — Progress Notes (Signed)
Summary: portable oxygen  Phone Note Call from Patient   Summary of Call: needs a rx for a portable oxygen tank  because when she is walking to phy. ther. she gives out of breath and [facility] said that she would need a rx from you Initial call taken by: Lind Guest,  Dec 31, 2009 1:51 PM  Follow-up for Phone Call        have [facility] send over paperwork that justifies the need tell her thhis is what I have to get done first pls Follow-up by: Syliva Overman MD,  Dec 31, 2009 4:05 PM  Additional Follow-up for Phone Call Additional follow up Details #1::        paulette aware Additional Follow-up by: Adella Hare LPN,  Dec 31, 452 4:23 PM

## 2010-09-28 NOTE — Assessment & Plan Note (Signed)
Summary: office visit   Vital Signs:  Patient profile:   77 year old female Menstrual status:  postmenopausal O2 Sat:      96 % on Room air Pulse rate:   66 / minute Pulse rhythm:   regular BP sitting:   80 / 50  (left arm)  Vitals Entered By: Adella Hare LPN (March 04, 7828 9:33 AM)  O2 Flow:  Room air CC: follow-up visit Is Patient Diabetic? No Pain Assessment Patient in pain? no        CC:  follow-up visit.  History of Present Illness: Reports  that she has been fair, biut is aware that she is getting weaker. Denies recent fever or chills. Denies sinus pressure, nasal congestion , ear pain or sore throat. Denies chest congestion, or cough productive of sputum.she does have a chronic dry cough Denies chest pain, palpitations, PND, orthopnea or leg swelling. Denies abdominal pain, nausea, vomitting, diarrhea or constipation. Denies change in bowel movements or bloody stool. Denies dysuria , frequency, incontinence or hesitancy.  Denies headaches, vertigo, seizures.  Denies  rash, lesions, or itch.     Current Medications (verified): 1)  Aricept 10 Mg  Tabs (Donepezil Hcl) .... Take 1 Tablet By Mouth Once A Day 2)  Plavix 75 Mg  Tabs (Clopidogrel Bisulfate) .... Take 1 Tablet By Mouth Once A Day 3)  Zetia 10 Mg  Tabs (Ezetimibe) .... Take 1 Tablet By Mouth Once A Day 4)  Oyster Shell Calcium + D 500-200 Mg-Unit  Tabs (Calcium Carbonate-Vitamin D) .... Take 1 Tablet By Mouth Three Times A Day 5)  Nitroquick 0.4 Mg  Subl (Nitroglycerin) .... Uad 6)  Omeprazole 20 Mg  Tbec (Omeprazole) .... Take 1 Tablet By Mouth Two Times A Day 7)  Diazepam 5 Mg  Tabs (Diazepam) .... Take 1 Tablet By Mouth Three Times A Day 8)  Benazepril-Hydrochlorothiazide 20-25 Mg Tabs (Benazepril-Hydrochlorothiazide) .... Take One Tab Daily By Mouth Once Daily For Bp 9)  Citalopram Hydrobromide 20 Mg Tabs (Citalopram Hydrobromide) .... One Tab By Mouth Qd 10)  Lovastatin 40 Mg Tabs (Lovastatin)  .... Two Tabs Po  By Mouth At Bedtime 11)  Lactulose 10 Gm/19ml Soln (Lactulose) .... One Tablespoon Every Four Days As Needed For Constipation 12)  Tramadol Hcl 50 Mg Tabs (Tramadol Hcl) .... Take 1 Tablet By Mouth Three Times A Day 13)  Furosemide 40 Mg Tabs (Furosemide) .... One Tab Every Morning 14)  Silvadene 1 % Crea (Silver Sulfadiazine) .... Apply Once Daily As Directed 15)  Duoderm .... Apply 1 Patch Every 3 Days Until Healed For Stage 2 Ulcer To Buttocks 16)  Colcrys 0.6 Mg Tabs (Colchicine) .... Take 1 Tablet By Mouth Once A Day 17)  Potassium Chloride 40 Meq/49ml (20%) Liqd (Potassium Chloride) .Marland Kitchen.. 15 Ml Every Morning  Allergies (verified): 1)  ! Asa  Review of Systems      See HPI General:  Complains of fatigue. Eyes:  Complains of vision loss-both eyes. ENT:  Complains of decreased hearing. MS:  Complains of joint pain, low back pain, mid back pain, muscle weakness, and stiffness; wheelchair dependent. Derm:  Denies itching, lesion(s), and rash. Psych:  Complains of anxiety and depression; denies mental problems, suicidal thoughts/plans, thoughts of violence, and unusual visions or sounds. Endo:  Denies excessive thirst and excessive urination. Heme:  Denies abnormal bruising and bleeding. Allergy:  Complains of seasonal allergies.  Physical Exam  General:  Well-developed,well-nourished,in no acute distress; alert,appropriate and cooperative throughout examination HEENT:  No facial asymmetry,  EOMI, No sinus tenderness, TM's Clear, oropharynx  pink and moist.   Chest: Clear to auscultation bilaterally.  CVS: S1, S2, No murmurs, No S3.   Abd: Soft, Nontender.  ZO:[id number]  ROM spine, hips, shoulders and knees. Pt ambulates with a walker Ext: no edema CNS: CN 2-12 intact, power tone and sensation normal throughout.   Skin:no breakdown noted Psych: Good eye contact, normal affect.  Memory loss, not anxious or depressed appearing.    Impression &  Recommendations:  Problem # 1:  PRESSURE ULCER STAGE II (ICD-707.22) Assessment Deteriorated marked hyperuricemia pt to continue colcrys, not symptomatic   Problem # 2:  DEPRESSION (ICD-311) Assessment: Improved  Her updated medication list for this problem includes:    Diazepam 5 Mg Tabs (Diazepam) .Marland Kitchen... Take 1 tablet by mouth three times a day    Citalopram Hydrobromide 20 Mg Tabs (Citalopram hydrobromide) ..... One tab by mouth qd  Problem # 3:  EDEMA LEG (ICD-782.3) Assessment: Improved  The following medications were removed from the medication list:    Benazepril-hydrochlorothiazide 20-25 Mg Tabs (Benazepril-hydrochlorothiazide) .Marland Kitchen... Take one tab daily by mouth once daily for bp Her updated medication list for this problem includes:    Furosemide 40 Mg Tabs (Furosemide) ..... One tab every morning  Problem # 4:  ANXIETY (ICD-300.00) Assessment: Improved  Her updated medication list for this problem includes:    Diazepam 5 Mg Tabs (Diazepam) .Marland Kitchen... Take 1 tablet by mouth three times a day    Citalopram Hydrobromide 20 Mg Tabs (Citalopram hydrobromide) ..... One tab by mouth qd  Problem # 5:  HYPERTENSION (ICD-401.9) Assessment: Comment Only  The following medications were removed from the medication list:    Benazepril-hydrochlorothiazide 20-25 Mg Tabs (Benazepril-hydrochlorothiazide) .Marland Kitchen... Take one tab daily by mouth once daily for bp Her updated medication list for this problem includes:    Furosemide 40 Mg Tabs (Furosemide) ..... One tab every morning  Orders: T-Basic Metabolic Panel [phone])  BP today: 80/50, overcorrected, pt to stop the antihypertensive , she also c/o intermittent lightheadedness Prior BP: 128/78 (01/12/2010)  Labs Reviewed: K+: 4.0 (08/17/2009) Creat: : 1.11 (08/17/2009)   Chol: 284 (08/17/2009)   HDL: 114 (08/17/2009)   LDL: 134 (08/17/2009)   TG: 179 (08/17/2009)  Problem # 6:  OSTEOARTHRITIS (ICD-715.90) Assessment:  Deteriorated  Her updated medication list for this problem includes:    Tramadol Hcl 50 Mg Tabs (Tramadol hcl) .Marland Kitchen... Take 1 tablet by mouth three times a day pt is wheelchair dependent  Complete Medication List: 1)  Aricept 10 Mg Tabs (Donepezil hcl) .... Take 1 tablet by mouth once a day 2)  Plavix 75 Mg Tabs (Clopidogrel bisulfate) .... Take 1 tablet by mouth once a day 3)  Zetia 10 Mg Tabs (Ezetimibe) .... Take 1 tablet by mouth once a day 4)  Oyster Shell Calcium + D 500-200 Mg-unit Tabs (Calcium carbonate-vitamin d) .... Take 1 tablet by mouth three times a day 5)  Nitroquick 0.4 Mg Subl (Nitroglycerin) .... Uad 6)  Omeprazole 20 Mg Tbec (Omeprazole) .... Take 1 tablet by mouth two times a day 7)  Diazepam 5 Mg Tabs (Diazepam) .... Take 1 tablet by mouth three times a day 8)  Citalopram Hydrobromide 20 Mg Tabs (Citalopram hydrobromide) .... One tab by mouth qd 9)  Lovastatin 40 Mg Tabs (Lovastatin) .... Two tabs po  by mouth at bedtime 10)  Lactulose 10 Gm/61ml Soln (Lactulose) .... One tablespoon every four days as needed for constipation  11)  Tramadol Hcl 50 Mg Tabs (Tramadol hcl) .... Take 1 tablet by mouth three times a day 12)  Furosemide 40 Mg Tabs (Furosemide) .... One tab every morning 13)  Silvadene 1 % Crea (Silver sulfadiazine) .... Apply once daily as directed 14)  Duoderm  .... Apply 1 patch every 3 days until healed for stage 2 ulcer to buttocks 15)  Colcrys 0.6 Mg Tabs (Colchicine) .... Take 1 tablet by mouth once a day 16)  Potassium Chloride 40 Meq/4ml (20%) Liqd (Potassium chloride) .Marland Kitchen.. 15 ml every morning  Other Orders: T-CBC w/Diff ([id number]) T-Lipid Profile [phone]) T-Hepatic Function [phone])  Patient Instructions: 1)  F/u nexrt Wednesday 2)  chem 77  stat next wednesday,lipids and hepatic  and cbc  next wed.Marland Kitchen 3)  Stop the bP med , your pressure is low

## 2010-09-28 NOTE — Letter (Signed)
Summary: consults  consults   Imported By: Lind Guest 01/29/2010 15:53:31  _____________________________________________________________________  External Attachment:    Type:   Image     Comment:   External Document

## 2010-09-28 NOTE — Miscellaneous (Signed)
Summary: Home Care Report  Home Care Report   Imported By: Lind Guest 03/08/2010 14:14:43  _____________________________________________________________________  External Attachment:    Type:   Image     Comment:   External Document

## 2010-09-28 NOTE — Miscellaneous (Signed)
Summary: Home Care Report  Home Care Report   Imported By: Lind Guest 10/12/2009 16:10:47  _____________________________________________________________________  External Attachment:    Type:   Image     Comment:   External Document

## 2010-09-28 NOTE — Letter (Signed)
Summary: misc  misc   Imported By: Lind Guest 01/29/2010 15:56:10  _____________________________________________________________________  External Attachment:    Type:   Image     Comment:   External Document

## 2010-09-28 NOTE — Assessment & Plan Note (Signed)
Summary: office visit   Vital Signs:  Patient profile:   77 year old female Menstrual status:  postmenopausal Height:      56 inches O2 Sat:      97 % Pulse rate:   61 / minute Pulse rhythm:   regular Resp:     16 per minute BP sitting:   128 / 78  (left arm) Cuff size:   large  Vitals Entered By: Everitt Amber LPN (Jan 12, 2010 11:01 AM) CC: hospital follow up   CC:  hospital follow up.  History of Present Illness: Pt recently d/cd from the nursing home following hospitalisation for generalised weakness. She reports having a wonderful experience She particularly seems to have both enjoyed and benefitted from the physical therapy , as well being among people she is familiar with , with increased interaction. She reports a good apetitie, and regular bowel movements. She denies any current head or chest congestion, nasal drainage , or cough productive of sputum. She has chronic urinary incontinence which is unchanged, she denies dysuria ormalodoros urine.     Current Medications (verified): 1)  Aricept 10 Mg  Tabs (Donepezil Hcl) .... Take 1 Tablet By Mouth Once A Day 2)  Plavix 75 Mg  Tabs (Clopidogrel Bisulfate) .... Take 1 Tablet By Mouth Once A Day 3)  Zetia 10 Mg  Tabs (Ezetimibe) .... Take 1 Tablet By Mouth Once A Day 4)  Oyster Shell Calcium + D 500-200 Mg-Unit  Tabs (Calcium Carbonate-Vitamin D) .... Take 1 Tablet By Mouth Three Times A Day 5)  Nitroquick 0.4 Mg  Subl (Nitroglycerin) .... Uad 6)  Omeprazole 20 Mg  Tbec (Omeprazole) .... Take 1 Tablet By Mouth Two Times A Day 7)  Diazepam 5 Mg  Tabs (Diazepam) .... Take 1 Tablet By Mouth Three Times A Day 8)  Benazepril-Hydrochlorothiazide 20-25 Mg Tabs (Benazepril-Hydrochlorothiazide) .... Take One Tab Daily By Mouth Once Daily For Bp 9)  Citalopram Hydrobromide 20 Mg Tabs (Citalopram Hydrobromide) .... One Tab By Mouth Qd 10)  Lovastatin 40 Mg Tabs (Lovastatin) .... Two Tabs Po  By Mouth At Bedtime 11)  Lactulose 10  Gm/72ml Soln (Lactulose) .... One Tablespoon Every Four Days As Needed For Constipation 12)  Tramadol Hcl 50 Mg Tabs (Tramadol Hcl) .... Take 1 Tablet By Mouth Three Times A Day 13)  Furosemide 40 Mg Tabs (Furosemide) .... One Tab Every Morning 14)  Silvadene 1 % Crea (Silver Sulfadiazine) .... Apply Once Daily As Directed 15)  Duoderm .... Apply 1 Patch Every 3 Days Until Healed For Stage 2 Ulcer To Buttocks 16)  Potassium Chloride 40 Meq/3ml (20%) Liqd (Potassium Chloride) 17)  Colcrys 0.6 Mg Tabs (Colchicine) .... Take 1 Tablet By Mouth Once A Day  Allergies (verified): 1)  ! Asa  Review of Systems      See HPI General:  Complains of fatigue and weakness. Eyes:  Complains of blurring and discharge; denies eye pain and red eye; poor vision with chronic excessive tearing. ENT:  Denies nasal congestion, postnasal drainage, sinus pressure, and sore throat. CV:  Denies chest pain or discomfort, palpitations, swelling of feet, and swelling of hands. Resp:  Denies cough and shortness of breath. MS:  Complains of joint pain, low back pain, mid back pain, muscle weakness, and stiffness. Derm:  Denies itching, lesion(s), and rash. Psych:  Complains of anxiety; denies depression. Endo:  Denies excessive thirst and excessive urination. Heme:  Denies abnormal bruising and bleeding. Allergy:  Complains of seasonal allergies; denies  hives or rash and itching eyes.  Physical Exam  General:  Well-developed,well-nourished,in no acute distress; alert,appropriate and cooperative throughout examination HEENT: No facial asymmetry,  EOMI, No sinus tenderness, TM's Clear, oropharynx  pink and moist.   Chest: Clear to auscultation bilaterally.  CVS: S1, S2, No murmurs, No S3.   Abd: Soft, Nontender.  ZO:[id number]  ROM spine, hips, shoulders and knees. Pt ambulates with a walker Ext: no edema CNS: CN 2-12 intact, power tone and sensation normal throughout.   Skin:no breakdown noted Psych: Good eye  contact, normal affect.  Memory loss, not anxious or depressed appearing.    Impression & Recommendations:  Problem # 1:  GOUT, UNSPECIFIED (ICD-274.9) Assessment Comment Only  Her updated medication list for this problem includes:    Colcrys 0.6 Mg Tabs (Colchicine) .Marland Kitchen... Take 1 tablet by mouth once a day, new med and dx astarted in the nursing home  Orders: T-Uric Acid (Blood) ([id number])  Problem # 2:  DEPRESSION (ICD-311) Assessment: Improved  Her updated medication list for this problem includes:    Diazepam 5 Mg Tabs (Diazepam) .Marland Kitchen... Take 1 tablet by mouth three times a day    Citalopram Hydrobromide 20 Mg Tabs (Citalopram hydrobromide) ..... One tab by mouth qd  Problem # 3:  HYPERTENSION (ICD-401.9) Assessment: Unchanged  Her updated medication list for this problem includes:    Benazepril-hydrochlorothiazide 20-25 Mg Tabs (Benazepril-hydrochlorothiazide) .Marland Kitchen... Take one tab daily by mouth once daily for bp    Furosemide 40 Mg Tabs (Furosemide) ..... One tab every morning  BP today: 128/78 Prior BP: 112/64 (10/19/2009)  Labs Reviewed: K+: 4.0 (08/17/2009) Creat: : 1.11 (08/17/2009)   Chol: 284 (08/17/2009)   HDL: 114 (08/17/2009)   LDL: 134 (08/17/2009)   TG: 179 (08/17/2009)  Problem # 4:  ANXIETY (ICD-300.00) Assessment: Improved  Her updated medication list for this problem includes:    Diazepam 5 Mg Tabs (Diazepam) .Marland Kitchen... Take 1 tablet by mouth three times a day    Citalopram Hydrobromide 20 Mg Tabs (Citalopram hydrobromide) ..... One tab by mouth qd  Problem # 5:  BACK PAIN (ICD-724.5) Assessment: Unchanged  Her updated medication list for this problem includes:    Tramadol Hcl 50 Mg Tabs (Tramadol hcl) .Marland Kitchen... Take 1 tablet by mouth three times a day  Complete Medication List: 1)  Aricept 10 Mg Tabs (Donepezil hcl) .... Take 1 tablet by mouth once a day 2)  Plavix 75 Mg Tabs (Clopidogrel bisulfate) .... Take 1 tablet by mouth once a day 3)  Zetia 10 Mg  Tabs (Ezetimibe) .... Take 1 tablet by mouth once a day 4)  Oyster Shell Calcium + D 500-200 Mg-unit Tabs (Calcium carbonate-vitamin d) .... Take 1 tablet by mouth three times a day 5)  Nitroquick 0.4 Mg Subl (Nitroglycerin) .... Uad 6)  Omeprazole 20 Mg Tbec (Omeprazole) .... Take 1 tablet by mouth two times a day 7)  Diazepam 5 Mg Tabs (Diazepam) .... Take 1 tablet by mouth three times a day 8)  Benazepril-hydrochlorothiazide 20-25 Mg Tabs (Benazepril-hydrochlorothiazide) .... Take one tab daily by mouth once daily for bp 9)  Citalopram Hydrobromide 20 Mg Tabs (Citalopram hydrobromide) .... One tab by mouth qd 10)  Lovastatin 40 Mg Tabs (Lovastatin) .... Two tabs po  by mouth at bedtime 11)  Lactulose 10 Gm/51ml Soln (Lactulose) .... One tablespoon every four days as needed for constipation 12)  Tramadol Hcl 50 Mg Tabs (Tramadol hcl) .... Take 1 tablet by mouth three times a day 13)  Furosemide 40 Mg Tabs (Furosemide) .... One tab every morning 14)  Silvadene 1 % Crea (Silver sulfadiazine) .... Apply once daily as directed 15)  Duoderm  .... Apply 1 patch every 3 days until healed for stage 2 ulcer to buttocks 16)  Potassium Chloride 40 Meq/48ml (20%) Liqd (Potassium chloride) 17)  Colcrys 0.6 Mg Tabs (Colchicine) .... Take 1 tablet by mouth once a day 18)  Potassium Chloride 40 Meq/49ml (20%) Liqd (Potassium chloride) .Marland Kitchen.. 15 ml every morning  Other Orders: T-Basic Metabolic Panel [id number])  Patient Instructions: 1)  F/U in 6 weeks. 2)  BMP prior to visit, ICD-9:   fatsing  in 6 weeks 3)  uric acid level 4)  Happy birthday in the next 4 days Prescriptions: FUROSEMIDE 40 MG TABS (FUROSEMIDE) one tab every morning  #30 x 4   Entered by:   Everitt Amber LPN   Authorized by:   Syliva Overman MD   Signed by:   Everitt Amber LPN on 09/81/1914   Method used:   Printed then faxed to ...       [facility]* (retail)       [address]       [place], [place]  [address]       Ph: [phone]       Fax: [phone]   RxID:   [id number] LACTULOSE 10 GM/15ML SOLN (LACTULOSE) one tablespoon every four days as needed for constipation  #62month x 2   Entered by:   Everitt Amber LPN   Authorized by:   Syliva Overman MD   Signed by:   Everitt Amber LPN on 40/34/7425   Method used:   Printed then faxed to ...       [facility]* (retail)       [address]       [place], [place]  [address]       Ph: [phone]       Fax: [phone]   RxID:   [id number] DIAZEPAM 5 MG  TABS (DIAZEPAM) Take 1 tablet by mouth three times a day  #90 x 4   Entered by:   Everitt Amber LPN   Authorized by:   Syliva Overman MD   Signed by:   Everitt Amber LPN on 25/42/7062   Method used:   Printed then faxed to ...       [facility]* (retail)       [address]       [place], [place]  [address]       Ph: [phone]       Fax: [phone]   RxID:   [id number] OMEPRAZOLE 20 MG  TBEC (OMEPRAZOLE) Take 1 tablet by mouth two times a day  #60 Each x 5   Entered by:   Everitt Amber LPN   Authorized by:   Syliva Overman MD   Signed by:   Everitt Amber LPN on 99/37/1696   Method used:   Printed then faxed to ...       [facility]* (retail)       [address]       [place], [place]  [address]       Ph: [phone]       Fax: [phone]   RxID:   [id number]  PLAVIX 75 MG  TABS (CLOPIDOGREL BISULFATE) Take 1 tablet by mouth once a day  #30 Each x 5   Entered by:   Everitt Amber LPN   Authorized by:   Syliva Overman MD   Signed by:   Everitt Amber LPN on 16/05/9603   Method used:   Printed then faxed to ...       [facility]* (retail)       [address]       [place], [place]  [address]       Ph: [phone]       Fax: [phone]   RxID:   [id number] ZETIA 10 MG  TABS (EZETIMIBE) Take 1 tablet by  mouth once a day  #30 x 5   Entered by:   Everitt Amber LPN   Authorized by:   Syliva Overman MD   Signed by:   Everitt Amber LPN on 06/25/2535   Method used:   Printed then faxed to ...       [facility]* (retail)       [address]       [place], [place]  [address]       Ph: [phone]       Fax: [phone]   RxID:   [id number] ARICEPT 10 MG  TABS (DONEPEZIL HCL) Take 1 tablet by mouth once a day  #30 Each x 5   Entered by:   Everitt Amber LPN   Authorized by:   Syliva Overman MD   Signed by:   Everitt Amber LPN on 32/35/5732   Method used:   Printed then faxed to ...       [facility]* (retail)       [address]       [place], [place]  [address]       Ph: [phone]       Fax: [phone]   RxID:   [id number] CITALOPRAM HYDROBROMIDE 20 MG TABS (CITALOPRAM HYDROBROMIDE) one tab by mouth qd  #30 Each x 5   Entered by:   Everitt Amber LPN   Authorized by:   Syliva Overman MD   Signed by:   Everitt Amber LPN on 35/00/9381   Method used:   Printed then faxed to ...       [facility]* (retail)       [address]       [place], [place]  [address]       Ph: [phone]       Fax: [phone]   RxID:   [id number] LOVASTATIN 40 MG TABS (LOVASTATIN) two tabs po  by mouth at bedtime  #60 x 5   Entered by:   Everitt Amber LPN   Authorized by:   Syliva Overman MD   Signed by:   Everitt Amber LPN on 15/40/0867   Method used:   Printed then faxed to ...       [facility]* (retail)       [address]       [place], [place]  [address]       Ph: [phone]       Fax: [phone]   RxID:   [id number] NITROQUICK 0.4 MG  SUBL (NITROGLYCERIN) uad  #  30 x 5   Entered by:   Everitt Amber LPN   Authorized by:   Syliva Overman MD   Signed by:   Everitt Amber LPN on 57/84/6962   Method used:   Printed then faxed to ...       [facility]* (retail)       [address]       [place], [place]  [address]       Ph: [phone]       Fax: [phone]   RxID:   [id number] BENAZEPRIL-HYDROCHLOROTHIAZIDE 20-25 MG TABS (BENAZEPRIL-HYDROCHLOROTHIAZIDE) Take one tab daily by mouth once daily for BP  #30 Each x 5   Entered by:   Everitt Amber LPN   Authorized by:   Syliva Overman MD   Signed by:   Everitt Amber LPN on 95/18/8416   Method used:   Printed then faxed to ...       [facility]* (retail)       [address]       [place], [place]  [address]       Ph: [phone]       Fax: [phone]   RxID:   [id number] POTASSIUM CHLORIDE 40 MEQ/15ML (20%) LIQD (POTASSIUM CHLORIDE) 15 ml every morning  #450 ml x 4   Entered and Authorized by:   Syliva Overman MD   Signed by:   Syliva Overman MD on 01/12/2010   Method used:   Electronically to        [facility]* (retail)       [address]       [place], [place]  [address]       Ph: [phone]       Fax: [phone]   RxID:   [id number]

## 2010-09-28 NOTE — Letter (Signed)
Summary: xray   xray   Imported By: Curtis Sites 05/17/2010 15:01:09  _____________________________________________________________________  External Attachment:    Type:   Image     Comment:   External Document

## 2010-09-28 NOTE — Progress Notes (Signed)
Summary: Dr Patty Sermons appt.  Phone Note From Other Clinic   Summary of Call: patient has an appt. on Dec. 6, 2011 at 10 am, with Dr Karilyn Cota.  Left msg with daughter about appt. Initial call taken by: Curtis Sites,  July 09, 2010 11:46 AM

## 2010-09-28 NOTE — Assessment & Plan Note (Signed)
Summary: office visit   Vital Signs:  Patient profile:   77 year old female Menstrual status:  postmenopausal Height:      56 inches O2 Sat:      98 % Pulse rate:   67 / minute Pulse rhythm:   regular Resp:     16 per minute BP sitting:   120 / 70  (left arm) Cuff size:   large  Vitals Entered By: Everitt Amber LPN (March 10, 2010 9:49 AM) CC: Follow up visit from pressure being too low last week    CC:  Follow up visit from pressure being too low last week .  History of Present Illness: pot is in primarily for re-eval of hypotension and abn labs.She reports improved energy since discontinuing the meds and less lightheadedness. She ddeenies leg swelling or cramps.She has increased her water intake.  Current Medications (verified): 1)  Aricept 10 Mg  Tabs (Donepezil Hcl) .... Take 1 Tablet By Mouth Once A Day 2)  Plavix 75 Mg  Tabs (Clopidogrel Bisulfate) .... Take 1 Tablet By Mouth Once A Day 3)  Zetia 10 Mg  Tabs (Ezetimibe) .... Take 1 Tablet By Mouth Once A Day 4)  Oyster Shell Calcium + D 500-200 Mg-Unit  Tabs (Calcium Carbonate-Vitamin D) .... Take 1 Tablet By Mouth Three Times A Day 5)  Nitroquick 0.4 Mg  Subl (Nitroglycerin) .... Uad 6)  Omeprazole 20 Mg  Tbec (Omeprazole) .... Take 1 Tablet By Mouth Two Times A Day 7)  Diazepam 5 Mg  Tabs (Diazepam) .... Take 1 Tablet By Mouth Three Times A Day 8)  Citalopram Hydrobromide 20 Mg Tabs (Citalopram Hydrobromide) .... One Tab By Mouth Qd 9)  Lovastatin 40 Mg Tabs (Lovastatin) .... Two Tabs Po  By Mouth At Bedtime 10)  Lactulose 10 Gm/71ml Soln (Lactulose) .... One Tablespoon Every Four Days As Needed For Constipation 11)  Tramadol Hcl 50 Mg Tabs (Tramadol Hcl) .... Take 1 Tablet By Mouth Three Times A Day 12)  Furosemide 40 Mg Tabs (Furosemide) .... One Tab Every Morning 13)  Silvadene 1 % Crea (Silver Sulfadiazine) .... Apply Once Daily As Directed 14)  Duoderm .... Apply 1 Patch Every 3 Days Until Healed For Stage 2 Ulcer To  Buttocks 15)  Colcrys 0.6 Mg Tabs (Colchicine) .... Take 1 Tablet By Mouth Once A Day 16)  Potassium Chloride 40 Meq/76ml (20%) Liqd (Potassium Chloride) .Marland Kitchen.. 15 Ml Every Morning  Allergies (verified): 1)  ! Asa  Review of Systems      See HPI Eyes:  Complains of vision loss-both eyes. ENT:  Denies hoarseness, nasal congestion, sinus pressure, and sore throat. CV:  Denies chest pain or discomfort, difficulty breathing while lying down, palpitations, and swelling of feet. Resp:  Denies cough and sputum productive. GI:  Denies abdominal pain, constipation, diarrhea, nausea, and vomiting. GU:  Denies dysuria and urinary frequency. MS:  Complains of joint pain and stiffness. Heme:  Denies abnormal bruising and bleeding. Allergy:  Complains of seasonal allergies.  Physical Exam  General:  Well-developed,well-nourished,in no acute distress; alert,appropriate and cooperative throughout examination HEENT: No facial asymmetry,  EOMI, No sinus tenderness, TM's Clear, oropharynx  pink and moist.   Chest: Clear to auscultation bilaterally.  CVS: S1, S2, No murmurs, No S3.   Abd: Soft, Nontender.  [id number]  ROM spine, hips, shoulders and knees. Pt ambulates with a walker Ext: no edema CNS: CN 2-12 intact, power tone and sensation normal throughout.   Skin:no breakdown noted Psych:  Good eye contact, normal affect.  Memory loss, not anxious or depressed appearing.    Impression & Recommendations:  Problem # 1:  DEPRESSION (ICD-311) Assessment Improved  Her updated medication list for this problem includes:    Diazepam 5 Mg Tabs (Diazepam) .Marland Kitchen... Take 1 tablet by mouth three times a day    Citalopram Hydrobromide 20 Mg Tabs (Citalopram hydrobromide) ..... One tab by mouth qd  Problem # 2:  LEG EDEMA (ICD-782.3) Assessment: Improved  Her updated medication list for this problem includes:    Furosemide 40 Mg Tabs (Furosemide) ..... One tab every morning  Problem # 3:  HYPERTENSION  (ICD-401.9) Assessment: Improved  Her updated medication list for this problem includes:    Furosemide 40 Mg Tabs (Furosemide) ..... One tab every morning  Orders: T-Basic Metabolic Panel [id number])  BP today: 120/70 Prior BP: 80/50 (03/04/2010)  Labs Reviewed: K+: 3.9 (02/25/2010) Creat: : 3.90 (02/25/2010)   Chol: 284 (08/17/2009)   HDL: 114 (08/17/2009)   LDL: 134 (08/17/2009)   TG: 179 (08/17/2009)  Problem # 4:  HYPERLIPIDEMIA (ICD-272.4) Assessment: Comment Only  Her updated medication list for this problem includes:    Zetia 10 Mg Tabs (Ezetimibe) .Marland Kitchen... Take 1 tablet by mouth once a day    Lovastatin 40 Mg Tabs (Lovastatin) .Marland Kitchen..Marland Kitchen Two tabs po  by mouth at bedtime  Labs Reviewed: SGOT: 14 (08/17/2009)   SGPT: 12 (08/17/2009)   HDL:114 (08/17/2009), 88 (34/74/2595)  LDL:134 (08/17/2009), 109 (05/21/2009)  Chol:284 (08/17/2009), 223 (05/21/2009)  Trig:179 (08/17/2009), 132 (05/21/2009)  Complete Medication List: 1)  Aricept 10 Mg Tabs (Donepezil hcl) .... Take 1 tablet by mouth once a day 2)  Plavix 75 Mg Tabs (Clopidogrel bisulfate) .... Take 1 tablet by mouth once a day 3)  Zetia 10 Mg Tabs (Ezetimibe) .... Take 1 tablet by mouth once a day 4)  Oyster Shell Calcium + D 500-200 Mg-unit Tabs (Calcium carbonate-vitamin d) .... Take 1 tablet by mouth three times a day 5)  Nitroquick 0.4 Mg Subl (Nitroglycerin) .... Uad 6)  Omeprazole 20 Mg Tbec (Omeprazole) .... Take 1 tablet by mouth two times a day 7)  Diazepam 5 Mg Tabs (Diazepam) .... Take 1 tablet by mouth three times a day 8)  Citalopram Hydrobromide 20 Mg Tabs (Citalopram hydrobromide) .... One tab by mouth qd 9)  Lovastatin 40 Mg Tabs (Lovastatin) .... Two tabs po  by mouth at bedtime 10)  Lactulose 10 Gm/49ml Soln (Lactulose) .... One tablespoon every four days as needed for constipation 11)  Tramadol Hcl 50 Mg Tabs (Tramadol hcl) .... Take 1 tablet by mouth three times a day 12)  Furosemide 40 Mg Tabs (Furosemide)  .... One tab every morning 13)  Silvadene 1 % Crea (Silver sulfadiazine) .... Apply once daily as directed 14)  Duoderm  .... Apply 1 patch every 3 days until healed for stage 2 ulcer to buttocks 15)  Colcrys 0.6 Mg Tabs (Colchicine) .... Take 1 tablet by mouth once a day 16)  Potassium Chloride 40 Meq/32ml (20%) Liqd (Potassium chloride) .Marland Kitchen.. 15 ml every morning  Patient Instructions: 1)  F/U in 6 weeks 2)  No med changes at this time . 3)  Your blood pressure is great. 4)  BMP prior to visit, ICD-9:  stat today

## 2010-09-28 NOTE — Letter (Signed)
Summary: consults chart 1  consults chart 1   Imported By: Curtis Sites 05/17/2010 14:58:56  _____________________________________________________________________  External Attachment:    Type:   Image     Comment:   External Document

## 2010-09-28 NOTE — Miscellaneous (Signed)
Summary: Home Care Report  Home Care Report   Imported By: Lind Guest 06/15/2010 08:15:14  _____________________________________________________________________  External Attachment:    Type:   Image     Comment:   External Document

## 2010-09-28 NOTE — Miscellaneous (Signed)
Summary: Home Care Report  Home Care Report   Imported By: Lind Guest 12/22/2009 14:52:03  _____________________________________________________________________  External Attachment:    Type:   Image     Comment:   External Document

## 2010-09-28 NOTE — Assessment & Plan Note (Signed)
Summary: office visit   Vital Signs:  Patient profile:   77 year old female Menstrual status:  postmenopausal Height:      56 inches O2 Sat:      94 % Pulse rate:   68 / minute Resp:     156 per minute BP sitting:   112 / 64 Cuff size:   large  Vitals Entered By: Everitt Amber LPN (October 19, 2009 3:01 PM) CC: Follow up chronic problems Is Patient Diabetic? No   CC:  Follow up chronic problems.  History of Present Illness: c/o uncontrolled anxiety. States she gets full easily, denies food sticking, reports bloating and early satiety x 1 month.Family member and caregiver support this also. She is primamrily complaing also of skin breakdown on her buttocks in the past several weeks. She ambulates only with a walker and is in a chair for most of her awke time  Current Medications (verified): 1)  Aricept 10 Mg  Tabs (Donepezil Hcl) .... Take 1 Tablet By Mouth Once A Day 2)  Plavix 75 Mg  Tabs (Clopidogrel Bisulfate) .... Take 1 Tablet By Mouth Once A Day 3)  Zetia 10 Mg  Tabs (Ezetimibe) .... Take 1 Tablet By Mouth Once A Day 4)  Oyster Shell Calcium + D 500-200 Mg-Unit  Tabs (Calcium Carbonate-Vitamin D) .... Take 1 Tablet By Mouth Three Times A Day 5)  Nitroquick 0.4 Mg  Subl (Nitroglycerin) .... Uad 6)  Omeprazole 20 Mg  Tbec (Omeprazole) .... Take 1 Tablet By Mouth Two Times A Day 7)  Diazepam 5 Mg  Tabs (Diazepam) .... Take 1 Tablet By Mouth Three Times A Day 8)  Benazepril-Hydrochlorothiazide 20-25 Mg Tabs (Benazepril-Hydrochlorothiazide) .... Take One Tab Daily By Mouth Once Daily For Bp 9)  Citalopram Hydrobromide 20 Mg Tabs (Citalopram Hydrobromide) .... One Tab By Mouth Qd 10)  Lovastatin 40 Mg Tabs (Lovastatin) .... Two Tabs Po  By Mouth At Bedtime 11)  Flonase 50 Mcg/act Susp (Fluticasone Propionate) .Marland Kitchen.. 1 Puff Per Nostril Daily 12)  Lactulose 10 Gm/55ml Soln (Lactulose) .... One Tablespoon Every Four Days As Needed For Constipation 13)  Tessalon Perles 100 Mg Caps  (Benzonatate) .... Take 1 Tablet By Mouth Three Times A Day 14)  Tramadol Hcl 50 Mg Tabs (Tramadol Hcl) .... Take 1 Tablet By Mouth Three Times A Day 15)  Furosemide 20 Mg Tabs (Furosemide) .... One in The Morning and Aone At 2 Pm 16)  Silvadene 1 % Crea (Silver Sulfadiazine) .... Apply Once Daily As Directed  Allergies (verified): 1)  ! Asa  Review of Systems General:  Complains of fatigue and weakness; denies chills and fever. Eyes:  Complains of vision loss-both eyes; denies eye pain and red eye. ENT:  Complains of postnasal drainage; denies nasal congestion, sinus pressure, and sore throat. CV:  Complains of difficulty breathing while lying down, fainting, shortness of breath with exertion, and swelling of feet; denies chest pain or discomfort. Resp:  Denies cough and sputum productive. GI:  Denies abdominal pain, constipation, and diarrhea. GU:  Complains of incontinence; denies dysuria and urinary frequency. MS:  Complains of joint pain, joint redness, joint swelling, low back pain, mid back pain, muscle weakness, and stiffness. Derm:  Complains of lesion(s); skin broken down on the buttocks. Neuro:  Denies headaches, seizures, and sensation of room spinning. Psych:  Complains of anxiety, depression, and sense of great danger; denies suicidal thoughts/plans, thoughts of violence, and thoughts /plans of harming others; anxiety is increasing with age .  Endo:  Denies cold intolerance, excessive hunger, excessive thirst, excessive urination, heat intolerance, polyuria, and weight change. Heme:  Denies abnormal bruising and bleeding. Allergy:  Complains of itching eyes, seasonal allergies, and sneezing.  Physical Exam  General:  Well-developed,well-nourished,in no acute distress; alert,appropriate and cooperative throughout examination HEENT: No facial asymmetry,  EOMI, No sinus tenderness, TM's Clear, oropharynx  pink and moist.   Chest: Clear to auscultation bilaterally.  CVS: S1,  S2, No murmurs, No S3.   Abd: Soft, Nontender.  ZO:[id number]  ROM spine, hips, shoulders and knees. Pt ambulates with a walker Ext: no edema CNS: CN 2-12 intact, power tone and sensation normal throughout.   Skin:stage 2 ulcers on buttocks  Psych: Good eye contact, normal affect.  Memory loss, not anxious or depressed appearing.    Impression & Recommendations:  Problem # 1:  PRESSURE ULCER STAGE II (ICD-707.22) Assessment New duoderm to be applied every 3 days until healed and attempts to be made torelieve direct presure to the area, ulcers are on the buttocks  Problem # 2:  DEPRESSION (ICD-311) Assessment: Improved  Her updated medication list for this problem includes:    Diazepam 5 Mg Tabs (Diazepam) .Marland Kitchen... Take 1 tablet by mouth three times a day    Citalopram Hydrobromide 20 Mg Tabs (Citalopram hydrobromide) ..... One tab by mouth qd  Problem # 3:  DYSPNEA (ICD-786.05) Assessment: Comment Only pt needs her oxygen supply to be from a local company  Problem # 4:  DEMENTIA (ICD-294.8) Assessment: Unchanged continue aricept 10mg  one daily  Problem # 5:  ANXIETY (ICD-300.00) Assessment: Deteriorated  Her updated medication list for this problem includes:    Diazepam 5 Mg Tabs (Diazepam) .Marland Kitchen... Take 1 tablet by mouth three times a day    Citalopram Hydrobromide 20 Mg Tabs (Citalopram hydrobromide) ..... One tab by mouth qd  Problem # 6:  HYPERTENSION (ICD-401.9) Assessment: Improved  Her updated medication list for this problem includes:    Benazepril-hydrochlorothiazide 20-25 Mg Tabs (Benazepril-hydrochlorothiazide) .Marland Kitchen... Take one tab daily by mouth once daily for bp    Furosemide 20 Mg Tabs (Furosemide) ..... One in the morning and aone at 2 pm  BP today: 112/64 Prior BP: 140/80 (08/17/2009)  Labs Reviewed: K+: 4.0 (08/17/2009) Creat: : 1.11 (08/17/2009)   Chol: 284 (08/17/2009)   HDL: 114 (08/17/2009)   LDL: 134 (08/17/2009)   TG: 179 (08/17/2009)  Problem # 7:   HYPERLIPIDEMIA (ICD-272.4) Assessment: Unchanged  Her updated medication list for this problem includes:    Zetia 10 Mg Tabs (Ezetimibe) .Marland Kitchen... Take 1 tablet by mouth once a day    Lovastatin 40 Mg Tabs (Lovastatin) .Marland Kitchen..Marland Kitchen Two tabs po  by mouth at bedtime  Labs Reviewed: SGOT: 14 (08/17/2009)   SGPT: 12 (08/17/2009)   HDL:114 (08/17/2009), 88 (98/06/9146)  LDL:134 (08/17/2009), 109 (05/21/2009)  Chol:284 (08/17/2009), 223 (05/21/2009)  Trig:179 (08/17/2009), 132 (05/21/2009)  Complete Medication List: 1)  Aricept 10 Mg Tabs (Donepezil hcl) .... Take 1 tablet by mouth once a day 2)  Plavix 75 Mg Tabs (Clopidogrel bisulfate) .... Take 1 tablet by mouth once a day 3)  Zetia 10 Mg Tabs (Ezetimibe) .... Take 1 tablet by mouth once a day 4)  Oyster Shell Calcium + D 500-200 Mg-unit Tabs (Calcium carbonate-vitamin d) .... Take 1 tablet by mouth three times a day 5)  Nitroquick 0.4 Mg Subl (Nitroglycerin) .... Uad 6)  Omeprazole 20 Mg Tbec (Omeprazole) .... Take 1 tablet by mouth two times a day 7)  Diazepam 5 Mg Tabs (Diazepam) .... Take 1 tablet by mouth three times a day 8)  Benazepril-hydrochlorothiazide 20-25 Mg Tabs (Benazepril-hydrochlorothiazide) .... Take one tab daily by mouth once daily for bp 9)  Citalopram Hydrobromide 20 Mg Tabs (Citalopram hydrobromide) .... One tab by mouth qd 10)  Lovastatin 40 Mg Tabs (Lovastatin) .... Two tabs po  by mouth at bedtime 11)  Flonase 50 Mcg/act Susp (Fluticasone propionate) .Marland Kitchen.. 1 puff per nostril daily 12)  Lactulose 10 Gm/27ml Soln (Lactulose) .... One tablespoon every four days as needed for constipation 13)  Tessalon Perles 100 Mg Caps (Benzonatate) .... Take 1 tablet by mouth three times a day 14)  Tramadol Hcl 50 Mg Tabs (Tramadol hcl) .... Take 1 tablet by mouth three times a day 15)  Furosemide 20 Mg Tabs (Furosemide) .... One in the morning and aone at 2 pm 16)  Silvadene 1 % Crea (Silver sulfadiazine) .... Apply once daily as directed 17)   Doxycycline Hyclate 100 Mg Tabs (Doxycycline hyclate) .... Take 1 capsule by mouth two times a day 18)  Duoderm  .... Apply 1 patch every 3 days until healed for stage 2 ulcer to buttocks  Patient Instructions: 1)  F/u in 3 to 4 weeks. 2)  You will start medication for the pressure sore on your buttock and will need dressings applied every 3 days till healed. 3)  You will be referred for tests on your gallbladder. 4)  PLS eat inc amts of food regularly. 5)  we will wensure that your oxygen is supplied from [facility] [employer] and not from any other company per your request. Prescriptions: DUODERM Apply 1 patch every 3 days until healed for stage 2 ulcer to buttocks  #12 x 0   Entered and Authorized by:   Syliva Overman MD   Signed by:   Syliva Overman MD on 10/23/2009   Method used:   Handwritten   RxID:   [id number] DOXYCYCLINE HYCLATE 100 MG TABS (DOXYCYCLINE HYCLATE) Take 1 capsule by mouth two times a day  #14 x 0   Entered and Authorized by:   Syliva Overman MD   Signed by:   Syliva Overman MD on 10/19/2009   Method used:   Electronically to        [facility] (retail)       [address]       [place], [place]  [address]       Ph: [phone]       Fax: [phone]   RxID:   [id number]

## 2010-09-28 NOTE — Progress Notes (Signed)
  Phone Note Other Incoming   Caller: dr sim[pson Summary of Call: pls call the pt and the pharmacy, advise of increased dose of potassium since her potassium is low, stamp and fax in script entered historically Initial call taken by: Syliva Overman MD,  July 06, 2010 7:31 AM  Follow-up for Phone Call        rx sent  Follow-up by: Adella Hare LPN,  July 06, 2010 10:11 AM    New/Updated Medications: POTASSIUM CHLORIDE 20 MEQ/15ML (10%) LIQD (POTASSIUM CHLORIDE) 30 ml. once daily POTASSIUM CHLORIDE 20 MEQ/15ML (10%) LIQD (POTASSIUM CHLORIDE) 30 ml. once daily (dose increase) Prescriptions: POTASSIUM CHLORIDE 20 MEQ/15ML (10%) LIQD (POTASSIUM CHLORIDE) 30 ml. once daily (dose increase)  #9085ml x 4   Entered by:   Adella Hare LPN   Authorized by:   Syliva Overman MD   Signed by:   Adella Hare LPN on 16/05/9603   Method used:   Electronically to        [facility] (retail)       [address]       [place], [place]  [address]       Ph: [phone]       Fax: [phone]   RxID:   [id number] POTASSIUM CHLORIDE 20 MEQ/15ML (10%) LIQD (POTASSIUM CHLORIDE) 30 ml. once daily  #9000 ml x 4   Entered and Authorized by:   Syliva Overman MD   Signed by:   Syliva Overman MD on 07/06/2010   Method used:   Historical   RxID:   [id number]

## 2010-09-28 NOTE — Progress Notes (Signed)
Summary: medicine  Phone Note Call from Patient   Summary of Call: needs her bed sore medicine sent ot [facility] Initial call taken by: Lind Guest,  November 10, 2009 11:22 AM    Prescriptions: SILVADENE 1 % CREA (SILVER SULFADIAZINE) apply once daily as directed  #20 gm x 0   Entered by:   Everitt Amber LPN   Authorized by:   Syliva Overman MD   Signed by:   Everitt Amber LPN on 16/05/9603   Method used:   Electronically to        [facility] (retail)       [address] [place], [place]  [address]       Ph: [phone]       Fax: [phone]   RxID:   [id number]

## 2010-09-28 NOTE — Miscellaneous (Signed)
Summary: Home Care Report  Home Care Report   Imported By: Lind Guest 05/11/2010 10:03:15  _____________________________________________________________________  External Attachment:    Type:   Image     Comment:   External Document

## 2010-09-28 NOTE — Assessment & Plan Note (Signed)
Summary: recert   Allergies: 1)  ! Asa   Complete Medication List: 1)  Aricept 10 Mg Tabs (Donepezil hcl) .... Take 1 tablet by mouth once a day 2)  Plavix 75 Mg Tabs (Clopidogrel bisulfate) .... Take 1 tablet by mouth once a day 3)  Zetia 10 Mg Tabs (Ezetimibe) .... Take 1 tablet by mouth once a day 4)  Oyster Shell Calcium + D 500-200 Mg-unit Tabs (Calcium carbonate-vitamin d) .... Take 1 tablet by mouth three times a day 5)  Nitroquick 0.4 Mg Subl (Nitroglycerin) .... Uad 6)  Omeprazole 20 Mg Tbec (Omeprazole) .... Take 1 tablet by mouth two times a day 7)  Diazepam 5 Mg Tabs (Diazepam) .... Take 1 tablet by mouth three times a day 8)  Benazepril-hydrochlorothiazide 20-25 Mg Tabs (Benazepril-hydrochlorothiazide) .... Take one tab daily by mouth once daily for bp 9)  Citalopram Hydrobromide 20 Mg Tabs (Citalopram hydrobromide) .... One tab by mouth qd 10)  Lovastatin 40 Mg Tabs (Lovastatin) .... Two tabs po  by mouth at bedtime 11)  Flonase 50 Mcg/act Susp (Fluticasone propionate) .Marland Kitchen.. 1 puff per nostril daily 12)  Lactulose 10 Gm/53ml Soln (Lactulose) .... One tablespoon every four days as needed for constipation 13)  Tramadol Hcl 50 Mg Tabs (Tramadol hcl) .... Take 1 tablet by mouth three times a day 14)  Furosemide 40 Mg Tabs (Furosemide) .... One tab every morning 15)  Silvadene 1 % Crea (Silver sulfadiazine) .... Apply once daily as directed 16)  Duoderm  .... Apply 1 patch every 3 days until healed for stage 2 ulcer to buttocks 17)  Potassium Chloride 40 Meq/21ml (20%) Liqd (Potassium chloride) form reviewed and signed off

## 2010-09-28 NOTE — Miscellaneous (Signed)
Summary: Home Care Report  Home Care Report   Imported By: Lind Guest 01/13/2010 08:05:34  _____________________________________________________________________  External Attachment:    Type:   Image     Comment:   External Document

## 2010-09-28 NOTE — Letter (Signed)
Summary: bedside commode  bedside commode   Imported By: Lind Guest 02/11/2010 08:13:07  _____________________________________________________________________  External Attachment:    Type:   Image     Comment:   External Document

## 2010-09-28 NOTE — Medication Information (Signed)
Summary: Tax adviser   Imported By: Lind Guest 11/05/2009 13:28:03  _____________________________________________________________________  External Attachment:    Type:   Image     Comment:   External Document

## 2010-09-28 NOTE — Medication Information (Signed)
Summary: Tax adviser   Imported By: Lind Guest 10/23/2009 13:54:01  _____________________________________________________________________  External Attachment:    Type:   Image     Comment:   External Document

## 2010-09-28 NOTE — Miscellaneous (Signed)
Summary: Home Care Report  Home Care Report   Imported By: Lind Guest 10/02/2009 15:54:34  _____________________________________________________________________  External Attachment:    Type:   Image     Comment:   External Document

## 2010-09-28 NOTE — Letter (Signed)
Summary: phone notes  phone notes   Imported By: Lind Guest 01/29/2010 15:57:04  _____________________________________________________________________  External Attachment:    Type:   Image     Comment:   External Document

## 2010-09-28 NOTE — Letter (Signed)
Summary: labs  labs   Imported By: Lind Guest 01/29/2010 15:55:29  _____________________________________________________________________  External Attachment:    Type:   Image     Comment:   External Document

## 2010-09-28 NOTE — Assessment & Plan Note (Signed)
Summary: flu shot  Nurse Visit   Allergies: 1)  ! Asa  Immunizations Administered:  Influenza Vaccine # 1:    Vaccine Type: Fluvax MCR    Site: right deltoid    Mfr: novartis    Dose: 0.5 ml    Route: IM    Given by: Adella Hare LPN    Exp. Date: 12/2010    Lot #: [id number]    VIS given: 03/23/10 version given May 05, 2010.  Orders Added: 1)  Influenza Vaccine MCR [00025]

## 2010-09-28 NOTE — Miscellaneous (Signed)
Summary: Home Care Report  Home Care Report   Imported By: Lind Guest 12/22/2009 14:51:44  _____________________________________________________________________  External Attachment:    Type:   Image     Comment:   External Document

## 2010-09-28 NOTE — Progress Notes (Signed)
Summary: med  Phone Note Call from Patient   Summary of Call: she is not taking the pot. everyday  and low because of not taking the potas. and does she really need to fill the new potas. pill call back at [phone] Initial call taken by: Lind Guest,  July 06, 2010 11:59 AM  Follow-up for Phone Call        returned call, left message Follow-up by: Adella Hare LPN,  July 06, 2010 1:35 PM  Additional Follow-up for Phone Call Additional follow up Details #1::        ADVISE SHE NEEDS TO TAKE DAILY AS PRESCRIBED, YES SHE NEEDS THE SUPPLEMENT  SINCE HER POTASSIUM IS LOW, AND THIS CAN CAUSE DISTURBANCE IN HEART RATE IF NOT TREATED. Pls CANCEL THE INCREASED DOSE IF ALREADY SENT ,AND ADJUST MED LIST BACK TO OLD DOSE  Additional Follow-up by: Syliva Overman MD,  July 06, 2010 1:53 PM    Additional Follow-up for Phone Call Additional follow up Details #2::    patient aware Follow-up by: Mauricia Area CMA,  July 07, 2010 9:16 AM

## 2010-09-28 NOTE — Letter (Signed)
Summary: office notes  office notes   Imported By: Lind Guest 01/29/2010 15:58:41  _____________________________________________________________________  External Attachment:    Type:   Image     Comment:   External Document

## 2010-09-28 NOTE — Assessment & Plan Note (Signed)
Summary: recert   Allergies: 1)  ! Asa   Complete Medication List: 1)  Aricept 10 Mg Tabs (Donepezil hcl) .... Take 1 tablet by mouth once a day 2)  Plavix 75 Mg Tabs (Clopidogrel bisulfate) .... Take 1 tablet by mouth once a day 3)  Zetia 10 Mg Tabs (Ezetimibe) .... Take 1 tablet by mouth once a day 4)  Oyster Shell Calcium + D 500-200 Mg-unit Tabs (Calcium carbonate-vitamin d) .... Take 1 tablet by mouth three times a day 5)  Nitroquick 0.4 Mg Subl (Nitroglycerin) .... Uad 6)  Omeprazole 20 Mg Tbec (Omeprazole) .... Take 1 tablet by mouth two times a day 7)  Diazepam 5 Mg Tabs (Diazepam) .... Take 1 tablet by mouth three times a day 8)  Citalopram Hydrobromide 20 Mg Tabs (Citalopram hydrobromide) .... One tab by mouth qd 9)  Lovastatin 40 Mg Tabs (Lovastatin) .... Two tabs po  by mouth at bedtime 10)  Lactulose 10 Gm/46ml Soln (Lactulose) .... One tablespoon every four days as needed for constipation 11)  Tramadol Hcl 50 Mg Tabs (Tramadol hcl) .... Take 1 tablet by mouth three times a day 12)  Furosemide 40 Mg Tabs (Furosemide) .... One tab every morning 13)  Silvadene 1 % Crea (Silver sulfadiazine) .... Apply once daily as directed 14)  Duoderm  .... Apply 1 patch every 3 days until healed for stage 2 ulcer to buttocks 15)  Colcrys 0.6 Mg Tabs (Colchicine) .... Take 1 tablet by mouth once a day 16)  Potassium Chloride 40 Meq/52ml (20%) Liqd (Potassium chloride) .Marland Kitchen.. 15 ml every morning  Other Orders: Re-certification of Home Health [phone])

## 2010-09-28 NOTE — Letter (Signed)
Summary: progress notes chart 1  progress notes chart 1   Imported By: Curtis Sites 05/17/2010 15:00:50  _____________________________________________________________________  External Attachment:    Type:   Image     Comment:   External Document

## 2010-09-28 NOTE — Progress Notes (Signed)
  Phone Note Other Incoming   Caller: dr Tycho Cheramie Summary of Call: pls sched ov with me before the end of next week , she also needs to bring the meds she is on i need to sign off a form Initial call taken by: Syliva Overman MD,  Jan 11, 2010 11:19 PM  Follow-up for Phone Call        coming today Follow-up by: Lind Guest,  Jan 12, 2010 9:26 AM

## 2010-09-28 NOTE — Assessment & Plan Note (Signed)
Summary: recert   Allergies: 1)  ! Asa   Complete Medication List: 1)  Aricept 10 Mg Tabs (Donepezil hcl) .... Take 1 tablet by mouth once a day 2)  Plavix 75 Mg Tabs (Clopidogrel bisulfate) .... Take 1 tablet by mouth once a day 3)  Zetia 10 Mg Tabs (Ezetimibe) .... Take 1 tablet by mouth once a day 4)  Oyster Shell Calcium + D 500-200 Mg-unit Tabs (Calcium carbonate-vitamin d) .... Take 1 tablet by mouth three times a day 5)  Nitroquick 0.4 Mg Subl (Nitroglycerin) .... Uad 6)  Omeprazole 20 Mg Tbec (Omeprazole) .... Take 1 tablet by mouth two times a day 7)  Diazepam 5 Mg Tabs (Diazepam) .... Take 1 tablet by mouth three times a day 8)  Benazepril-hydrochlorothiazide 20-25 Mg Tabs (Benazepril-hydrochlorothiazide) .... Take one tab daily by mouth once daily for bp 9)  Citalopram Hydrobromide 20 Mg Tabs (Citalopram hydrobromide) .... One tab by mouth qd 10)  Lovastatin 40 Mg Tabs (Lovastatin) .... Two tabs po  by mouth at bedtime 11)  Flonase 50 Mcg/act Susp (Fluticasone propionate) .Marland Kitchen.. 1 puff per nostril daily 12)  Lactulose 10 Gm/38ml Soln (Lactulose) .... One tablespoon every four days as needed for constipation 13)  Tessalon Perles 100 Mg Caps (Benzonatate) .... Take 1 tablet by mouth three times a day 14)  Tramadol Hcl 50 Mg Tabs (Tramadol hcl) .... Take 1 tablet by mouth three times a day 15)  Furosemide 20 Mg Tabs (Furosemide) .... One in the morning and aone at 2 pm recertification reviewed and signed

## 2010-09-28 NOTE — Assessment & Plan Note (Signed)
Summary: OFFICE VISIT   Vital Signs:  Patient profile:   77 year old female Menstrual status:  postmenopausal O2 Sat:      98 % on Room air Pulse rate:   65 / minute Pulse rhythm:   regular Resp:     16 per minute BP sitting:   116 / 70  (left arm)  Vitals Entered By: Adella Hare LPN (April 22, 2010 9:51 AM)  O2 Flow:  Room air CC: follow-up visit Is Patient Diabetic? No Pain Assessment Patient in pain? no        CC:  follow-up visit.  History of Present Illness: Reports  that she has been  doing well. she feels less fatigued and light headed, and is here for re-evaluation  of her blood pressure on current significantly reduced meds. Denies recent fever or chills. Denies sinus pressure, nasal congestion , ear pain or sore throat. Denies chest congestion, or cough productive of sputum. Denies chest pain, palpitations, PND, orthopnea or leg swelling. Denies abdominal pain, nausea, vomitting, diarrhea or constipation. Denies change in bowel movements or bloody stool. Denies dysuria , frequency, incontinence or hesitancy.  Denies headaches, vertigo, seizures. Denies depression, anxiety or insomnia.Controlled on meds. Denies  rash, lesions, or itch.     Current Medications (verified): 1)  Aricept 10 Mg  Tabs (Donepezil Hcl) .... Take 1 Tablet By Mouth Once A Day 2)  Plavix 75 Mg  Tabs (Clopidogrel Bisulfate) .... Take 1 Tablet By Mouth Once A Day 3)  Zetia 10 Mg  Tabs (Ezetimibe) .... Take 1 Tablet By Mouth Once A Day 4)  Oyster Shell Calcium + D 500-200 Mg-Unit  Tabs (Calcium Carbonate-Vitamin D) .... Take 1 Tablet By Mouth Three Times A Day 5)  Nitroquick 0.4 Mg  Subl (Nitroglycerin) .... Uad 6)  Omeprazole 20 Mg  Tbec (Omeprazole) .... Take 1 Tablet By Mouth Two Times A Day 7)  Diazepam 5 Mg  Tabs (Diazepam) .... Take 1 Tablet By Mouth Three Times A Day 8)  Citalopram Hydrobromide 20 Mg Tabs (Citalopram Hydrobromide) .... One Tab By Mouth Qd 9)  Lovastatin 40 Mg  Tabs (Lovastatin) .... Two Tabs Po  By Mouth At Bedtime 10)  Lactulose 10 Gm/37ml Soln (Lactulose) .... One Tablespoon Every Four Days As Needed For Constipation 11)  Tramadol Hcl 50 Mg Tabs (Tramadol Hcl) .... Take 1 Tablet By Mouth Three Times A Day 12)  Furosemide 40 Mg Tabs (Furosemide) .... One Tab Every Morning 13)  Silvadene 1 % Crea (Silver Sulfadiazine) .... Apply Once Daily As Directed 14)  Duoderm .... Apply 1 Patch Every 3 Days Until Healed For Stage 2 Ulcer To Buttocks 15)  Colcrys 0.6 Mg Tabs (Colchicine) .... Take 1 Tablet By Mouth Once A Day 16)  Potassium Chloride 40 Meq/57ml (20%) Liqd (Potassium Chloride) .Marland Kitchen.. 15 Ml Every Morning  Allergies (verified): 1)  ! Asa  Review of Systems      See HPI Eyes:  Complains of vision loss-both eyes. MS:  Complains of joint pain, low back pain, mid back pain, muscle weakness, and stiffness. Endo:  Denies cold intolerance, excessive thirst, excessive urination, and polyuria. Heme:  Denies abnormal bruising and bleeding. Allergy:  Complains of seasonal allergies and sneezing.  Physical Exam  General:  Well-developed,well-nourished,in no acute distress; alert,appropriate and cooperative throughout examinationPt is wheelchair bound. HEENT: No facial asymmetry,  EOMI, No sinus tenderness, TM's Clear, oropharynx  pink and moist.   Chest: Clear to auscultation bilaterally.  CVS: S1, S2, No  murmurs, No S3.   Abd: Soft, Nontender.  ZO:[id number]  ROM spine, hips, shoulders and knees. Pt ambulates with a walker Ext: no edema CNS: CN 2-12 intact, power tone and sensation normal throughout.   Skin:no breakdown noted Psych: Good eye contact, normal affect.  Memory loss, not anxious or depressed appearing.    Impression & Recommendations:  Problem # 1:  EDEMA LEG (ICD-782.3) Assessment Improved  Her updated medication list for this problem includes:    Furosemide 40 Mg Tabs (Furosemide) ..... One tab every morning  Problem # 2:   DEMENTIA (ICD-294.8) Assessment: Unchanged continue aricept  Problem # 3:  HYPERTENSION (ICD-401.9) Assessment: Unchanged  Her updated medication list for this problem includes:    Furosemide 40 Mg Tabs (Furosemide) ..... One tab every morning  Orders: T-Basic Metabolic Panel [phone])  BP today: 116/70 Prior BP: 120/70 (03/10/2010)  Labs Reviewed: K+: 3.6 (04/13/2010) Creat: : 1.41 (04/13/2010)   Chol: 202 (03/10/2010)   HDL: 70 (03/10/2010)   LDL: 108 (03/10/2010)   TG: 122 (03/10/2010)  Problem # 4:  HYPERLIPIDEMIA (ICD-272.4) Assessment: Improved  Her updated medication list for this problem includes:    Zetia 10 Mg Tabs (Ezetimibe) .Marland Kitchen... Take 1 tablet by mouth once a day    Lovastatin 40 Mg Tabs (Lovastatin) .Marland Kitchen..Marland Kitchen Two tabs po  by mouth at bedtime  Orders: T-Hepatic Function [id number]) T-Lipid Profile [phone])  Labs Reviewed: SGOT: 16 (03/10/2010)   SGPT: 10 (03/10/2010)   HDL:70 (03/10/2010), 114 (84/13/2440)  LDL:108 (03/10/2010), 134 (08/17/2009)  Chol:202 (03/10/2010), 284 (08/17/2009)  Trig:122 (03/10/2010), 179 (08/17/2009)  Problem # 5:  ANXIETY (ICD-300.00) Assessment: Improved  Her updated medication list for this problem includes:    Diazepam 5 Mg Tabs (Diazepam) .Marland Kitchen... Take 1 tablet by mouth three times a day    Citalopram Hydrobromide 20 Mg Tabs (Citalopram hydrobromide) ..... One tab by mouth qd  Problem # 6:  GERD (ICD-530.81) Assessment: Improved  Her updated medication list for this problem includes:    Omeprazole 20 Mg Tbec (Omeprazole) .Marland Kitchen... Take 1 tablet by mouth two times a day  Complete Medication List: 1)  Aricept 10 Mg Tabs (Donepezil hcl) .... Take 1 tablet by mouth once a day 2)  Plavix 75 Mg Tabs (Clopidogrel bisulfate) .... Take 1 tablet by mouth once a day 3)  Zetia 10 Mg Tabs (Ezetimibe) .... Take 1 tablet by mouth once a day 4)  Oyster Shell Calcium + D 500-200 Mg-unit Tabs (Calcium carbonate-vitamin d) .... Take 1  tablet by mouth three times a day 5)  Nitroquick 0.4 Mg Subl (Nitroglycerin) .... Uad 6)  Omeprazole 20 Mg Tbec (Omeprazole) .... Take 1 tablet by mouth two times a day 7)  Diazepam 5 Mg Tabs (Diazepam) .... Take 1 tablet by mouth three times a day 8)  Citalopram Hydrobromide 20 Mg Tabs (Citalopram hydrobromide) .... One tab by mouth qd 9)  Lovastatin 40 Mg Tabs (Lovastatin) .... Two tabs po  by mouth at bedtime 10)  Lactulose 10 Gm/29ml Soln (Lactulose) .... One tablespoon every four days as needed for constipation 11)  Tramadol Hcl 50 Mg Tabs (Tramadol hcl) .... Take 1 tablet by mouth three times a day 12)  Furosemide 40 Mg Tabs (Furosemide) .... One tab every morning 13)  Silvadene 1 % Crea (Silver sulfadiazine) .... Apply once daily as directed 14)  Duoderm  .... Apply 1 patch every 3 days until healed for stage 2 ulcer to buttocks 15)  Colcrys 0.6 Mg Tabs (Colchicine) .Marland KitchenMarland KitchenMarland Kitchen  Take 1 tablet by mouth once a day 16)  Potassium Chloride 40 Meq/93ml (20%) Liqd (Potassium chloride) .Marland Kitchen.. 15 ml every morning  Other Orders: T-Uric Acid (Blood) ([id number])  Patient Instructions: 1)  Please schedule a follow-up appointment in 2 to 2.5 months. 2)  Flu vaccine available mid September 3)  BMP prior to visit, ICD-9: 4)  Hepatic Panel prior to visit, ICD-9: fasting in 3 months 5)  Lipid Panel prior to visit, ICD-9: 6)  Uric acid Prescriptions: COLCRYS 0.6 MG TABS (COLCHICINE) Take 1 tablet by mouth once a day  #30 Each x 3   Entered by:   Adella Hare LPN   Authorized by:   Syliva Overman MD   Signed by:   Adella Hare LPN on 09/81/1914   Method used:   Electronically to        [facility]* (retail)       [address]       [place], [place]  [address]       Ph: [phone]       Fax: [phone]   RxID:   [id number] OYSTER SHELL CALCIUM + D 500-200 MG-UNIT  TABS (CALCIUM CARBONATE-VITAMIN D) Take 1 tablet by mouth three times a day  #90 x 3    Entered by:   Adella Hare LPN   Authorized by:   Syliva Overman MD   Signed by:   Adella Hare LPN on 40/34/7425   Method used:   Electronically to        [facility] (retail)       [address] [place], [place]  [address]       Ph: [phone]       Fax: [phone]   RxID:   [id number]

## 2010-09-28 NOTE — Letter (Signed)
Summary: demographic chart 1  demographic chart 1   Imported By: Curtis Sites 05/17/2010 14:59:28  _____________________________________________________________________  External Attachment:    Type:   Image     Comment:   External Document

## 2010-09-28 NOTE — Letter (Signed)
Summary: requet to stop med for procedure  requet to stop med for procedure   Imported By: Lind Guest 08/05/2010 12:43:26  _____________________________________________________________________  External Attachment:    Type:   Image     Comment:   External Document

## 2010-09-28 NOTE — Progress Notes (Signed)
Summary: [facility]  Phone Note Call from Patient   Summary of Call: jenelle at [facility] about her oxygen  call back at [phone] Initial call taken by: Lind Guest,  October 27, 2009 3:56 PM  Follow-up for Phone Call        called, no answer Follow-up by: Everitt Amber LPN,  October 27, 2009 4:05 PM  Additional Follow-up for Phone Call Additional follow up Details #1::        spoke to them at [facility] and it was already handled. It was regarding the o2 which the patient already has through [facility] Additional Follow-up by: Everitt Amber LPN,  October 28, 2009 1:59 PM

## 2010-09-28 NOTE — Miscellaneous (Signed)
Summary: Home Care Report  Home Care Report   Imported By: Lind Guest 09/29/2009 15:41:37  _____________________________________________________________________  External Attachment:    Type:   Image     Comment:   External Document

## 2010-09-28 NOTE — Miscellaneous (Signed)
Summary: Home Care Report  Home Care Report   Imported By: Lind Guest 06/28/2010 10:20:40  _____________________________________________________________________  External Attachment:    Type:   Image     Comment:   External Document

## 2010-09-28 NOTE — Letter (Signed)
Summary: misc chart 1  misc chart 1   Imported By: Curtis Sites 05/17/2010 15:00:09  _____________________________________________________________________  External Attachment:    Type:   Image     Comment:   External Document

## 2010-09-28 NOTE — Letter (Signed)
Summary: lab chart 1  lab chart 1   Imported By: Curtis Sites 05/17/2010 14:59:49  _____________________________________________________________________  External Attachment:    Type:   Image     Comment:   External Document

## 2010-09-28 NOTE — Letter (Signed)
Summary: phone notes chart 1  phone notes chart 1   Imported By: Curtis Sites 05/17/2010 15:00:30  _____________________________________________________________________  External Attachment:    Type:   Image     Comment:   External Document

## 2010-09-28 NOTE — Miscellaneous (Signed)
Summary: Home Care Report  Home Care Report   Imported By: Lind Guest 11/10/2009 09:25:13  _____________________________________________________________________  External Attachment:    Type:   Image     Comment:   External Document

## 2010-09-28 NOTE — Progress Notes (Signed)
Summary: bed sore  Phone Note Call from Patient   Summary of Call: her bed sore isn't better in crack area needs more doxyicilla called into [facility]  [phone] Initial call taken by: Rudene Anda,  November 12, 2009 10:14 AM  Follow-up for Phone Call        ok to refill doxy per dr Jamicheal Heard Follow-up by: Adella Hare LPN,  November 12, 2009 10:58 AM

## 2010-09-28 NOTE — Assessment & Plan Note (Signed)
Summary: RECERT   Allergies: 1)  ! Asa   Complete Medication List: 1)  Aricept 10 Mg Tabs (Donepezil hcl) .... Take 1 tablet by mouth once a day 2)  Plavix 75 Mg Tabs (Clopidogrel bisulfate) .... Take 1 tablet by mouth once a day 3)  Zetia 10 Mg Tabs (Ezetimibe) .... Take 1 tablet by mouth once a day 4)  Oyster Shell Calcium + D 500-200 Mg-unit Tabs (Calcium carbonate-vitamin d) .... Take 1 tablet by mouth three times a day 5)  Nitroquick 0.4 Mg Subl (Nitroglycerin) .... Uad 6)  Omeprazole 20 Mg Tbec (Omeprazole) .... Take 1 tablet by mouth two times a day 7)  Diazepam 5 Mg Tabs (Diazepam) .... Take 1 tablet by mouth three times a day 8)  Citalopram Hydrobromide 20 Mg Tabs (Citalopram hydrobromide) .... One tab by mouth qd 9)  Lovastatin 40 Mg Tabs (Lovastatin) .... Two tabs po  by mouth at bedtime 10)  Lactulose 10 Gm/89ml Soln (Lactulose) .... One tablespoon every four days as needed for constipation 11)  Tramadol Hcl 50 Mg Tabs (Tramadol hcl) .... Take 1 tablet by mouth three times a day 12)  Furosemide 40 Mg Tabs (Furosemide) .... One tab every morning 13)  Silvadene 1 % Crea (Silver sulfadiazine) .... Apply once daily as directed 14)  Duoderm  .... Apply 1 patch every 3 days until healed for stage 2 ulcer to buttocks 15)  Colcrys 0.6 Mg Tabs (Colchicine) .... Take 1 tablet by mouth once a day 16)  Potassium Chloride 40 Meq/71ml (20%) Liqd (Potassium chloride) .Marland Kitchen.. 15 ml every morning  Other Orders: Re-certification of Home Health [phone])

## 2010-09-28 NOTE — Progress Notes (Signed)
Summary: ED  Phone Note Call from Patient   Summary of Call: just wanted to let dr. Lodema Hong know that pt is at ED.  Initial call taken by: Rudene Anda,  November 26, 2009 10:39 AM  Follow-up for Phone Call        noted Follow-up by: Syliva Overman MD,  November 26, 2009 1:11 PM

## 2010-09-28 NOTE — Assessment & Plan Note (Signed)
Summary: RECERT   Allergies: 1)  ! Asa   Complete Medication List: 1)  Aricept 10 Mg Tabs (Donepezil hcl) .... Take 1 tablet by mouth once a day 2)  Plavix 75 Mg Tabs (Clopidogrel bisulfate) .... Take 1 tablet by mouth once a day 3)  Zetia 10 Mg Tabs (Ezetimibe) .... Take 1 tablet by mouth once a day 4)  Oyster Shell Calcium + D 500-200 Mg-unit Tabs (Calcium carbonate-vitamin d) .... Take 1 tablet by mouth three times a day 5)  Nitroquick 0.4 Mg Subl (Nitroglycerin) .... Uad 6)  Omeprazole 20 Mg Tbec (Omeprazole) .... Take 1 tablet by mouth two times a day 7)  Diazepam 5 Mg Tabs (Diazepam) .... Take 1 tablet by mouth three times a day 8)  Citalopram Hydrobromide 20 Mg Tabs (Citalopram hydrobromide) .... One tab by mouth qd 9)  Lovastatin 40 Mg Tabs (Lovastatin) .... Two tabs po  by mouth at bedtime 10)  Lactulose 10 Gm/15ml Soln (Lactulose) .... One tablespoon every four days as needed for constipation 11)  Tramadol Hcl 50 Mg Tabs (Tramadol hcl) .... Take 1 tablet by mouth three times a day 12)  Furosemide 40 Mg Tabs (Furosemide) .... One tab every morning 13)  Silvadene 1 % Crea (Silver sulfadiazine) .... Apply once daily as directed 14)  Duoderm  .... Apply 1 patch every 3 days until healed for stage 2 ulcer to buttocks 15)  Colcrys 0.6 Mg Tabs (Colchicine) .... Take 1 tablet by mouth once a day 16)  Potassium Chloride 40 Meq/15ml (20%) Liqd (Potassium chloride) .... 15 ml every morning  Other Orders: Re-certification of Home Health ([id number]) 

## 2010-09-28 NOTE — Assessment & Plan Note (Signed)
Summary: office visit   Vital Signs:  Patient profile:   77 year old female Menstrual status:  postmenopausal O2 Sat:      97 % on Room air Pulse rate:   67 / minute Pulse rhythm:   regular BP sitting:   168 / 80  (right arm)  Vitals Entered By: Mauricia Area CMA (July 08, 2010 9:48 AM)  O2 Flow:  Room air CC: Dizzy, nausea and vomiting   CC:  Dizzy and nausea and vomiting.  History of Present Illness: Pt reports that sshe has not been doing very well as far as her swallowing is concerned. She c/o food sticking while trying to swallow ,and increasing difficulty with this.Shealso c/o nausea, but denies any actual emesis. Her bowel movements are for the most partunchanged in the past 6 months. Pt c/odysponea, with minimal activity and at rest, clinically , there is npo evidence of this, and her mob ility is limited due to extrewme arthritis, she is primarily wheelchair bound. She denies any recent febvr or chills. She has perrenial allergies with post nasal drainage and cough. She denies dysira or frequency, she has incontinence.   Current Medications (verified): 1)  Aricept 10 Mg  Tabs (Donepezil Hcl) .... Take 1 Tablet By Mouth Once A Day 2)  Plavix 75 Mg  Tabs (Clopidogrel Bisulfate) .... Take 1 Tablet By Mouth Once A Day 3)  Zetia 10 Mg  Tabs (Ezetimibe) .... Take 1 Tablet By Mouth Once A Day 4)  Oyster Shell Calcium + D 500-200 Mg-Unit  Tabs (Calcium Carbonate-Vitamin D) .... Take 1 Tablet By Mouth Three Times A Day 5)  Nitroquick 0.4 Mg  Subl (Nitroglycerin) .... Uad 6)  Omeprazole 20 Mg  Tbec (Omeprazole) .... Take 1 Tablet By Mouth Two Times A Day 7)  Diazepam 5 Mg  Tabs (Diazepam) .... Take 1 Tablet By Mouth Three Times A Day 8)  Citalopram Hydrobromide 20 Mg Tabs (Citalopram Hydrobromide) .... One Tab By Mouth Qd 9)  Lovastatin 40 Mg Tabs (Lovastatin) .... Two Tabs Po  By Mouth At Bedtime 10)  Lactulose 10 Gm/21ml Soln (Lactulose) .... One Tablespoon Every Four Days As  Needed For Constipation 11)  Tramadol Hcl 50 Mg Tabs (Tramadol Hcl) .... Take 1 Tablet By Mouth Three Times A Day 12)  Furosemide 40 Mg Tabs (Furosemide) .... One Tab Every Morning 13)  Silvadene 1 % Crea (Silver Sulfadiazine) .... Apply Once Daily As Directed 14)  Duoderm .... Apply 1 Patch Every 3 Days Until Healed For Stage 2 Ulcer To Buttocks 15)  Colcrys 0.6 Mg Tabs (Colchicine) .... Take 1 Tablet By Mouth Once A Day 16)  Potassium Chloride 20 Meq/2ml (10%) Liqd (Potassium Chloride) .... 30 Ml. Once Daily (Dose Increase)  Allergies (verified): 1)  ! Asa  Review of Systems      See HPI General:  Complains of fatigue. Eyes:  Denies discharge and red eye. ENT:  Complains of decreased hearing, difficulty swallowing, nasal congestion, and postnasal drainage; denies earache, hoarseness, and ringing in ears. CV:  Complains of chest pain or discomfort, fatigue, shortness of breath with exertion, and swelling of feet; reproducible chest wall pain , in the past 2 weeks, she . Resp:  Complains of cough and shortness of breath; denies sputum productive; 2 week history.Marland Kitchen GI:  Complains of nausea; denies change in bowel habits, constipation, diarrhea, and vomiting. MS:  Complains of joint pain, low back pain, mid back pain, muscle weakness, and stiffness; increased bilaterl knee pain and  back pain. Derm:  Denies itching, lesion(s), and rash. Neuro:  Complains of memory loss, poor balance, and visual disturbances. Psych:  Complains of anxiety; denies easily angered, mental problems, suicidal thoughts/plans, thoughts of violence, and unusual visions or sounds. Endo:  Denies cold intolerance, excessive thirst, excessive urination, and heat intolerance. Heme:  Denies abnormal bruising and bleeding. Allergy:  Complains of itching eyes.  Physical Exam  General:  Well-developed,well-nourished,in no acute distress; alert,appropriate and cooperative throughout examinationPt is wheelchair bound. HEENT:  No facial asymmetry,  EOMI, No sinus tenderness, TM's Clear, oropharynx  pink and moist. Poor dentition  Chest: Clear to auscultation bilaterally.  CVS: S1, S2, No murmurs, No S3.   Abd: Soft, Nontender.  UJ:[id number]  ROM spine, hips, shoulders and knees. Pt ambulates with a walker, but is primarily wheelchair bound Ext: no edema CNS: CN 2-12 intact, power tone and sensation normal throughout.   Skin:no breakdown noted Psych: Good eye contact, normal affect.  Memory loss, not anxious or depressed appearing.    Impression & Recommendations:  Problem # 1:  DYSPHAGIA UNSPECIFIED (ICD-787.20) Assessment Deteriorated  Orders: Gastroenterology Referral (GI)  Problem # 2:  GERD (ICD-530.81) Assessment: Deteriorated  Her updated medication list for this problem includes:    Omeprazole 20 Mg Tbec (Omeprazole) .Marland Kitchen... Take 1 tablet by mouth two times a day  Orders: Medicare Electronic Prescription [phone])  Problem # 3:  ANXIETY (ICD-300.00) Assessment: Improved  Her updated medication list for this problem includes:    Diazepam 5 Mg Tabs (Diazepam) .Marland Kitchen... Take 1 tablet by mouth three times a day    Citalopram Hydrobromide 20 Mg Tabs (Citalopram hydrobromide) ..... One tab by mouth qd  Problem # 4:  HYPERTENSION (ICD-401.9) Assessment: Deteriorated  Her updated medication list for this problem includes:    Furosemide 40 Mg Tabs (Furosemide) ..... One tab every morning    Benazepril-hydrochlorothiazide 20-25 Mg Tabs (Benazepril-hydrochlorothiazide) ..... One half ttablet once daily  Orders: Medicare Electronic Prescription [phone])  BP today: 168/80 Prior BP: 116/70 (04/22/2010)  Labs Reviewed: K+: 3.3 (07/05/2010) Creat: : 1.22 (07/05/2010)   Chol: 198 (07/05/2010)   HDL: 98 (07/05/2010)   LDL: 83 (07/05/2010)   TG: 85 (07/05/2010)  Problem # 5:  HYPERLIPIDEMIA (ICD-272.4) Assessment: Comment Only  Her updated medication list for this problem includes:    Zetia 10 Mg Tabs  (Ezetimibe) .Marland Kitchen... Take 1 tablet by mouth once a day    Lovastatin 40 Mg Tabs (Lovastatin) .Marland Kitchen..Marland Kitchen Two tabs po  by mouth at bedtime Low fat dietdiscussed and encouraged  Labs Reviewed: SGOT: 17 (07/05/2010)   SGPT: 8 (07/05/2010)   HDL:98 (07/05/2010), 70 (03/10/2010)  LDL:83 (07/05/2010), 108 (78/46/9629)  Chol:198 (07/05/2010), 202 (03/10/2010)  Trig:85 (07/05/2010), 122 (03/10/2010)  Problem # 6:  BACK PAIN (ICD-724.5) Assessment: Deteriorated  Her updated medication list for this problem includes:    Tramadol Hcl 50 Mg Tabs (Tramadol hcl) .Marland Kitchen... Take 1 tablet by mouth three times a day  Orders: Ketorolac-Toradol 15mg  (B2841) Depo- Medrol 40mg  (J1030) Admin of Therapeutic Inj  intramuscular or subcutaneous (32440) Admin of Therapeutic Inj  intramuscular or subcutaneous (10272)  Complete Medication List: 1)  Aricept 10 Mg Tabs (Donepezil hcl) .... Take 1 tablet by mouth once a day 2)  Plavix 75 Mg Tabs (Clopidogrel bisulfate) .... Take 1 tablet by mouth once a day 3)  Zetia 10 Mg Tabs (Ezetimibe) .... Take 1 tablet by mouth once a day 4)  Oyster Shell Calcium + D 500-200 Mg-unit Tabs (Calcium carbonate-vitamin d) .Marland KitchenMarland KitchenMarland Kitchen  Take 1 tablet by mouth three times a day 5)  Nitroquick 0.4 Mg Subl (Nitroglycerin) .... Uad 6)  Omeprazole 20 Mg Tbec (Omeprazole) .... Take 1 tablet by mouth two times a day 7)  Diazepam 5 Mg Tabs (Diazepam) .... Take 1 tablet by mouth three times a day 8)  Citalopram Hydrobromide 20 Mg Tabs (Citalopram hydrobromide) .... One tab by mouth qd 9)  Lovastatin 40 Mg Tabs (Lovastatin) .... Two tabs po  by mouth at bedtime 10)  Lactulose 10 Gm/94ml Soln (Lactulose) .... One tablespoon every four days as needed for constipation 11)  Tramadol Hcl 50 Mg Tabs (Tramadol hcl) .... Take 1 tablet by mouth three times a day 12)  Furosemide 40 Mg Tabs (Furosemide) .... One tab every morning 13)  Silvadene 1 % Crea (Silver sulfadiazine) .... Apply once daily as directed 14)  Duoderm   .... Apply 1 patch every 3 days until healed for stage 2 ulcer to buttocks 15)  Colcrys 0.6 Mg Tabs (Colchicine) .... Take 1 tablet by mouth once a day 16)  Potassium Chloride 20 Meq/53ml (10%) Liqd (Potassium chloride) .... 30 ml. once daily (dose increase) 17)  Benazepril-hydrochlorothiazide 20-25 Mg Tabs (Benazepril-hydrochlorothiazide) .... One halfttablet once daily  Patient Instructions: 1)  f/u in 5 to 6 weeks 2)  labs are excellent. 3)  injection today for arthritic pains.  4)  BP is high, pLS sTART benazepril/hctz 20/25 oNE hALF once daily 5)  you will be referred to Dr Renae Fickle about your swallowing. 6)  happy thanksgiving!!!   Medication Administration  Injection # 1:    Medication: Ketorolac-Toradol 15mg     Diagnosis: BACK PAIN (ICD-724.5)    Route: IM    Site: RUOQ gluteus    Exp Date: 06/07/2010    Lot #: [id number]    Mfr: [email or web address]    Comments: 30 mg given    Patient tolerated injection without complications    Given by: Mauricia Area CMA (July 08, 2010 10:56 AM)  Injection # 2:    Medication: Depo- Medrol 40mg     Diagnosis: BACK PAIN (ICD-724.5)    Route: IM    Site: LUOQ gluteus    Exp Date: 08/2011    Lot #: [id number]    Mfr: Pharmacia    Comments: 40 mg gvien    Patient tolerated injection without complications    Given by: Mauricia Area CMA (July 08, 2010 10:57 AM)  Orders Added: 1)  Est. Patient Level IV [60454] 2)  Medicare Electronic Prescription [G8553] 3)  Gastroenterology Referral [GI] 4)  Ketorolac-Toradol 15mg  [J1885] 5)  Depo- Medrol 40mg  [J1030] 6)  Admin of Therapeutic Inj  intramuscular or subcutaneous [96372] 7)  Admin of Therapeutic Inj  intramuscular or subcutaneous [09811]

## 2010-09-28 NOTE — Miscellaneous (Signed)
Summary: Home Care Report  Home Care Report   Imported By: Lind Guest 01/13/2010 08:05:06  _____________________________________________________________________  External Attachment:    Type:   Image     Comment:   External Document

## 2010-09-28 NOTE — Letter (Signed)
Summary: history and physical  history and physical   Imported By: Lind Guest 01/29/2010 15:54:59  _____________________________________________________________________  External Attachment:    Type:   Image     Comment:   External Document

## 2010-09-28 NOTE — Letter (Signed)
Summary: x rays  x rays   Imported By: Lind Guest 01/29/2010 15:57:30  _____________________________________________________________________  External Attachment:    Type:   Image     Comment:   External Document

## 2010-09-28 NOTE — Progress Notes (Signed)
[facility]: [facility]  Phone Note Call from Patient   Summary of Call: kim k[facility] called and said they did her recert  and everything is ok Initial call taken by: Lind Guest,  February 26, 2010 1:29 PM  Follow-up for Phone Call        noted Follow-up by: Syliva Overman MD,  March 02, 2010 8:45 AM

## 2010-09-30 NOTE — Assessment & Plan Note (Signed)
Summary: recert for 1.5.11   Allergies: 1)  ! Asa   Complete Medication List: 1)  Aricept 10 Mg Tabs (Donepezil hcl) .... Take 1 tablet by mouth once a day 2)  Plavix 75 Mg Tabs (Clopidogrel bisulfate) .... Take 1 tablet by mouth once a day 3)  Zetia 10 Mg Tabs (Ezetimibe) .... Take 1 tablet by mouth once a day 4)  Oyster Shell Calcium + D 500-200 Mg-unit Tabs (Calcium carbonate-vitamin d) .... Take 1 tablet by mouth three times a day 5)  Nitroquick 0.4 Mg Subl (Nitroglycerin) .... Uad 6)  Omeprazole 20 Mg Tbec (Omeprazole) .... Take 1 tablet by mouth two times a day 7)  Diazepam 5 Mg Tabs (Diazepam) .... Take 1 tablet by mouth three times a day 8)  Citalopram Hydrobromide 20 Mg Tabs (Citalopram hydrobromide) .... One tab by mouth qd 9)  Lovastatin 40 Mg Tabs (Lovastatin) .... Two tabs po  by mouth at bedtime 10)  Lactulose 10 Gm/42ml Soln (Lactulose) .... One tablespoon every four days as needed for constipation 11)  Tramadol Hcl 50 Mg Tabs (Tramadol hcl) .... Take 1 tablet by mouth three times a day 12)  Furosemide 40 Mg Tabs (Furosemide) .... One tab every morning 13)  Silvadene 1 % Crea (Silver sulfadiazine) .... Apply once daily as directed 14)  Duoderm  .... Apply 1 patch every 3 days until healed for stage 2 ulcer to buttocks 15)  Colcrys 0.6 Mg Tabs (Colchicine) .... Take 1 tablet by mouth once a day 16)  Potassium Chloride 20 Meq/30ml (10%) Liqd (Potassium chloride) .... 30 ml. once daily (dose increase) 17)  Benazepril-hydrochlorothiazide 20-25 Mg Tabs (Benazepril-hydrochlorothiazide) .... One halfttablet once daily  Other Orders: Re-certification of Home Health [phone])   Orders Added: 1)  Re-certification of Home Health [G0179]

## 2010-09-30 NOTE — Letter (Signed)
Summary: fl 2 paper  fl 2 paper   Imported By: Lind Guest 09/08/2010 09:16:10  _____________________________________________________________________  External Attachment:    Type:   Image     Comment:   External Document

## 2010-09-30 NOTE — Letter (Signed)
Summary: CAPP  CAPP   Imported By: Lind Guest 09/23/2010 11:50:10  _____________________________________________________________________  External Attachment:    Type:   Image     Comment:   External Document

## 2010-09-30 NOTE — Letter (Signed)
Summary: recert  recert   Imported By: Lind Guest 09/10/2010 13:28:26  _____________________________________________________________________  External Attachment:    Type:   Image     Comment:   External Document

## 2010-09-30 NOTE — Assessment & Plan Note (Signed)
Summary: F UP   Vital Signs:  Patient profile:   77 year old female Menstrual status:  postmenopausal O2 Sat:      95 % Pulse rate:   54 / minute Pulse rhythm:   regular BP sitting:   120 / 80  Vitals Entered By: Adella Hare LPN (August 19, 2010 10:58 AM) CC: follow-up visit Is Patient Diabetic? No Comments did not bring meds to ov   CC:  follow-up visit.  History of Present Illness: Reports  that she is doing fairly well exce[pt for recent leg swelling. She is here for review of her blood pressure. She denies any adverse side effects from meds currently taking, specifically light headedness, cough  or palpitations. Denies recent fever or chills. Denies sinus pressure, nasal congestion , ear pain or sore throat. Denies chest congestion, or cough productive of sputum. Denies chest pain, palpitations, PND, orthopnea or leg swelling. Denies abdominal pain, nausea, vomitting, diarrhea or constipation. Denies change in bowel movements or bloody stool. Denies dysuria , frequency, incontinence or hesitancy. Denies  joint pain, swelling, or reduced mobility. Denies headaches, vertigo, seizures. Denies depression, anxiety or insomnia.controlled on meds. Denies  rash, lesions, or itch.     Allergies: 1)  ! Asa  Review of Systems      See HPI General:  Complains of fatigue. Eyes:  Complains of vision loss-both eyes. MS:  Complains of joint pain, low back pain, mid back pain, muscle weakness, and stiffness. Neuro:  Complains of poor balance, visual disturbances, and weakness. Endo:  Denies excessive thirst and excessive urination. Heme:  Denies abnormal bruising and bleeding. Allergy:  Denies hives or rash and itching eyes.  Physical Exam  General:  Well-developed,well-nourished,in no acute distress; alert,appropriate and cooperative throughout examination HEENT: No facial asymmetry,  EOMI, No sinus tenderness, TM's Clear, oropharynx  pink and moist.   Chest: Clear to  auscultation bilaterally.  CVS: S1, S2, No murmurs, No S3.   Abd: Soft, Nontender.  WN:[id number]  ROM spine, hips, shoulders and knees.  Ext:2 plus edema.   CNS: CN 2-12 intact, decreased powe in lower ext, tone  and sensation normal throughout.   Skin: Intact, no visible lesions or rashes.  Psych: Good eye contact, normal affect.  Memory loss, not anxious or depressed appearing.    Impression & Recommendations:  Problem # 1:  EDEMA LEG (ICD-782.3) Assessment Deteriorated  Her updated medication list for this problem includes:    Furosemide 40 Mg Tabs (Furosemide) ..... One tab every morning    Benazepril-hydrochlorothiazide 20-25 Mg Tabs (Benazepril-hydrochlorothiazide) ..... One halfttablet once daily  Orders: Furosemide- Lasix Injection (J1940) Admin of Therapeutic Inj  intramuscular or subcutaneous (40347)  Problem # 2:  DEPRESSION (ICD-311) Assessment: Improved  Her updated medication list for this problem includes:    Diazepam 5 Mg Tabs (Diazepam) .Marland Kitchen... Take 1 tablet by mouth three times a day    Citalopram Hydrobromide 20 Mg Tabs (Citalopram hydrobromide) ..... One tab by mouth qd  Problem # 3:  HYPERTENSION (ICD-401.9) Assessment: Improved  Her updated medication list for this problem includes:    Furosemide 40 Mg Tabs (Furosemide) ..... One tab every morning    Benazepril-hydrochlorothiazide 20-25 Mg Tabs (Benazepril-hydrochlorothiazide) ..... One halfttablet once daily  Orders: T-Basic Metabolic Panel ([id number])  BP today: 120/80 Prior BP: 168/80 (07/08/2010)  Labs Reviewed: K+: 3.3 (07/05/2010) Creat: : 1.22 (07/05/2010)   Chol: 198 (07/05/2010)   HDL: 98 (07/05/2010)   LDL: 83 (07/05/2010)   TG: 85 (07/05/2010)  Problem # 4:  HYPERLIPIDEMIA (ICD-272.4) Assessment: Unchanged  Her updated medication list for this problem includes:    Zetia 10 Mg Tabs (Ezetimibe) .Marland Kitchen... Take 1 tablet by mouth once a day    Lovastatin 40 Mg Tabs (Lovastatin) .Marland Kitchen..Marland Kitchen Two  tabs po  by mouth at bedtime  Orders: T-Lipid Profile [id number]) T-Hepatic Function [phone])  Labs Reviewed: SGOT: 17 (07/05/2010)   SGPT: 8 (07/05/2010)   HDL:98 (07/05/2010), 70 (03/10/2010)  LDL:83 (07/05/2010), 108 (29/56/2130)  Chol:198 (07/05/2010), 202 (03/10/2010)  Trig:85 (07/05/2010), 122 (03/10/2010)  Problem # 5:  ANXIETY (ICD-300.00) Assessment: Improved  Her updated medication list for this problem includes:    Diazepam 5 Mg Tabs (Diazepam) .Marland Kitchen... Take 1 tablet by mouth three times a day    Citalopram Hydrobromide 20 Mg Tabs (Citalopram hydrobromide) ..... One tab by mouth qd  Complete Medication List: 1)  Aricept 10 Mg Tabs (Donepezil hcl) .... Take 1 tablet by mouth once a day 2)  Plavix 75 Mg Tabs (Clopidogrel bisulfate) .... Take 1 tablet by mouth once a day 3)  Zetia 10 Mg Tabs (Ezetimibe) .... Take 1 tablet by mouth once a day 4)  Oyster Shell Calcium + D 500-200 Mg-unit Tabs (Calcium carbonate-vitamin d) .... Take 1 tablet by mouth three times a day 5)  Nitroquick 0.4 Mg Subl (Nitroglycerin) .... Uad 6)  Omeprazole 20 Mg Tbec (Omeprazole) .... Take 1 tablet by mouth two times a day 7)  Diazepam 5 Mg Tabs (Diazepam) .... Take 1 tablet by mouth three times a day 8)  Citalopram Hydrobromide 20 Mg Tabs (Citalopram hydrobromide) .... One tab by mouth qd 9)  Lovastatin 40 Mg Tabs (Lovastatin) .... Two tabs po  by mouth at bedtime 10)  Lactulose 10 Gm/52ml Soln (Lactulose) .... One tablespoon every four days as needed for constipation 11)  Tramadol Hcl 50 Mg Tabs (Tramadol hcl) .... Take 1 tablet by mouth three times a day 12)  Furosemide 40 Mg Tabs (Furosemide) .... One tab every morning 13)  Silvadene 1 % Crea (Silver sulfadiazine) .... Apply once daily as directed 14)  Duoderm  .... Apply 1 patch every 3 days until healed for stage 2 ulcer to buttocks 15)  Colcrys 0.6 Mg Tabs (Colchicine) .... Take 1 tablet by mouth once a day 16)  Potassium Chloride 20  Meq/24ml (10%) Liqd (Potassium chloride) .... 30 ml. once daily (dose increase) 17)  Benazepril-hydrochlorothiazide 20-25 Mg Tabs (Benazepril-hydrochlorothiazide) .... One halfttablet once daily  Other Orders: T-TSH ([id number])  Patient Instructions: 1)  f/u in 3.5 months. 2)  bP is 120/80...excellent 3)  Continue current meds 4)  BMP prior to visit, ICD-9: 5)  Hepatic Panel prior to visit, ICD-9: 6)  Lipid Panel prior to visit, ICD-9: 7)  TSH prior to visit, ICD-9:  fastin in 3.5 months 8)  injection today for leg swelling   Medication Administration  Injection # 1:    Medication: Furosemide- Lasix Injection    Diagnosis: EDEMA LEG (ICD-782.3)    Route: IM    Site: RUOQ gluteus    Exp Date: 10/28/2010    Lot #: [id number]    Mfr: hospira    Comments: lasix 10mg  given    Patient tolerated injection without complications    Given by: Adella Hare LPN (August 19, 2010 11:33 AM)  Orders Added: 1)  Est. Patient Level IV [99214] 2)  T-Basic Metabolic Panel [[id number]] 3)  T-Lipid Profile [80061-22930] 4)  T-Hepatic Function [80076-22960] 5)  T-TSH [[id number]] 6)  Furosemide- Lasix  Injection [J1940] 7)  Admin of Therapeutic Inj  intramuscular or subcutaneous [96372]     Medication Administration  Injection # 1:    Medication: Furosemide- Lasix Injection    Diagnosis: EDEMA LEG (ICD-782.3)    Route: IM    Site: RUOQ gluteus    Exp Date: 10/28/2010    Lot #: [id number]    Mfr: hospira    Comments: lasix 10mg  given    Patient tolerated injection without complications    Given by: Adella Hare LPN (August 19, 2010 11:33 AM)  Orders Added: 1)  Est. Patient Level IV [99214] 2)  T-Basic Metabolic Panel [[id number]] 3)  T-Lipid Profile [80061-22930] 4)  T-Hepatic Function [80076-22960] 5)  T-TSH [[id number]] 6)  Furosemide- Lasix Injection [J1940] 7)  Admin of Therapeutic Inj  intramuscular or subcutaneous [08657]

## 2010-10-06 NOTE — Progress Notes (Signed)
Summary: new rx  Phone Note Call from Patient   Summary of Call: pt needs a new rx for blood pressure medicine. [facility] [phone] needs a cough pill needs a new rx for that to Initial call taken by: Rudene Anda,  September 27, 2010 11:19 AM  Follow-up for Phone Call        blood pressure medication sent, patient is also requesting cough med pills Follow-up by: Adella Hare LPN,  September 27, 2010 1:29 PM  Additional Follow-up for Phone Call Additional follow up Details #1::        will send in med Additional Follow-up by: Syliva Overman MD,  September 27, 2010 5:16 PM    Additional Follow-up for Phone Call Additional follow up Details #2::    nmed sent in for cough Follow-up by: Syliva Overman MD,  September 27, 2010 5:19 PM  New/Updated Medications: TESSALON PERLES 100 MG CAPS (BENZONATATE) Take 1 capsule by mouth two times a day as needed for cough Prescriptions: TESSALON PERLES 100 MG CAPS (BENZONATATE) Take 1 capsule by mouth two times a day as needed for cough  #60 x 1   Entered and Authorized by:   Syliva Overman MD   Signed by:   Syliva Overman MD on 09/27/2010   Method used:   Electronically to        [facility]* (retail)       [address] [place], [place]  [address]       Ph: [phone]       Fax: [phone]   RxID:   [id number] BENAZEPRIL-HYDROCHLOROTHIAZIDE 20-25 MG TABS (BENAZEPRIL-HYDROCHLOROTHIAZIDE) ONE hALFTtABLET ONCE dAILy  #15 x 2   Entered by:   Adella Hare LPN   Authorized by:   Syliva Overman MD   Signed by:   Adella Hare LPN on 08/31/7251   Method used:   Electronically to        [facility] (retail)       [address] [place], [place]  [address]       Ph: [phone]       Fax: [phone]   RxID:   [id number]  patient aware

## 2010-10-06 NOTE — Letter (Signed)
Summary: [facility]  [facility] [employer]   Imported By: Lind Guest 09/27/2010 10:11:35  _____________________________________________________________________  External Attachment:    Type:   Image     Comment:   External Document

## 2010-10-13 ENCOUNTER — Encounter: Payer: Self-pay | Admitting: Family Medicine

## 2010-10-18 ENCOUNTER — Telehealth: Payer: Self-pay | Admitting: Family Medicine

## 2010-10-20 NOTE — Letter (Signed)
Summary: [employer]  [facility] [employer]   Imported By: Lind Guest 10/13/2010 09:25:31  _____________________________________________________________________  External Attachment:    Type:   Image     Comment:   External Document

## 2010-10-26 NOTE — Progress Notes (Signed)
Summary: nurse  Phone Note Call from Patient   Summary of Call: pt needs to know if papers are filled out and made copies of [phone] Initial call taken by: Rudene Anda,  October 18, 2010 12:02 PM  Follow-up for Phone Call        I have not seen any papers on this patient Follow-up by: Adella Hare LPN,  October 18, 2010 1:26 PM  Additional Follow-up for Phone Call Additional follow up Details #1::        forms already completed Additional Follow-up by: Syliva Overman MD,  October 20, 2010 12:33 PM

## 2010-10-28 ENCOUNTER — Telehealth: Payer: Self-pay | Admitting: Family Medicine

## 2010-11-01 ENCOUNTER — Telehealth: Payer: Self-pay | Admitting: Family Medicine

## 2010-11-02 ENCOUNTER — Ambulatory Visit: Payer: Self-pay

## 2010-11-04 NOTE — Progress Notes (Signed)
Summary: medicine  Phone Note Call from Patient   Summary of Call: needs tramodol and lovestin send [place] apot Initial call taken by: Lind Guest,  October 28, 2010 1:41 PM    Prescriptions: TRAMADOL HCL 50 MG TABS (TRAMADOL HCL) Take 1 tablet by mouth three times a day  #90 Each x 1   Entered by:   Adella Hare LPN   Authorized by:   Syliva Overman MD   Signed by:   Adella Hare LPN on 04/54/0981   Method used:   Electronically to        [facility] (retail)       [address] [place], [place]  [address]       Ph: [phone]       Fax: [phone]   RxID:   [id number] LOVASTATIN 40 MG TABS (LOVASTATIN) two tabs po  by mouth at bedtime  #60 Each x 1   Entered by:   Adella Hare LPN   Authorized by:   Syliva Overman MD   Signed by:   Adella Hare LPN on 36/64/4034   Method used:   Electronically to        [facility] (retail)       [address] [place], [place]  [address]       Ph: [phone]       Fax: [phone]   RxID:   [id number]

## 2010-11-08 ENCOUNTER — Ambulatory Visit: Payer: Medicare Other

## 2010-11-08 ENCOUNTER — Encounter: Payer: Self-pay | Admitting: Family Medicine

## 2010-11-08 ENCOUNTER — Ambulatory Visit (INDEPENDENT_AMBULATORY_CARE_PROVIDER_SITE_OTHER): Payer: Medicare Other

## 2010-11-08 DIAGNOSIS — M1990 Unspecified osteoarthritis, unspecified site: Secondary | ICD-10-CM

## 2010-11-08 DIAGNOSIS — F411 Generalized anxiety disorder: Secondary | ICD-10-CM

## 2010-11-08 DIAGNOSIS — F068 Other specified mental disorders due to known physiological condition: Secondary | ICD-10-CM

## 2010-11-09 ENCOUNTER — Encounter: Payer: Self-pay | Admitting: Family Medicine

## 2010-11-09 ENCOUNTER — Ambulatory Visit (INDEPENDENT_AMBULATORY_CARE_PROVIDER_SITE_OTHER): Payer: Medicare Other

## 2010-11-09 DIAGNOSIS — R609 Edema, unspecified: Secondary | ICD-10-CM

## 2010-11-09 NOTE — Progress Notes (Addendum)
Summary: legs swollen and cramps in stomach  Phone Note Call from Patient   Summary of Call: both legs are swollen and cramps in stomach wants to come in this no ava. please advise call back at [phone] Initial call taken by: Lind Guest,  November 01, 2010 1:04 PM  Follow-up for Phone Call        nurse visit for lasix  to be admioinistered and chem 7 to be checked Follow-up by: Syliva Overman MD,  November 01, 2010 3:44 PM  Additional Follow-up for Phone Call Additional follow up Details #1::        ms Carico and paulette aware Additional Follow-up by: Adella Hare LPN,  November 01, 2010 3:51 PM     Appended Document: Orders Update    Clinical Lists Changes  Orders: Added new Test order of T-Basic Metabolic Panel [phone]) - Signed

## 2010-11-10 LAB — CONVERTED CEMR LAB
BUN: 35 mg/dL — ABNORMAL HIGH (ref 6–23)
CO2: 29 meq/L (ref 19–32)
Calcium: 9.9 mg/dL (ref 8.4–10.5)
Chloride: 96 meq/L (ref 96–112)
Creatinine, Ser: 1.92 mg/dL — ABNORMAL HIGH (ref 0.40–1.20)
Glucose, Bld: 84 mg/dL (ref 70–99)
Potassium: 3.7 meq/L (ref 3.5–5.3)
Sodium: 140 meq/L (ref 135–145)

## 2010-11-16 NOTE — Miscellaneous (Signed)
Summary: Home Care Report  Home Care Report   Imported By: Lind Guest 11/09/2010 09:12:25  _____________________________________________________________________  External Attachment:    Type:   Image     Comment:   External Document

## 2010-11-16 NOTE — Assessment & Plan Note (Signed)
Summary: recert   Allergies: 1)  ! Asa   Complete Medication List: 1)  Aricept 10 Mg Tabs (Donepezil hcl) .... Take 1 tablet by mouth once a day 2)  Plavix 75 Mg Tabs (Clopidogrel bisulfate) .... Take 1 tablet by mouth once a day 3)  Zetia 10 Mg Tabs (Ezetimibe) .... Take 1 tablet by mouth once a day 4)  Oyster Shell Calcium + D 500-200 Mg-unit Tabs (Calcium carbonate-vitamin d) .... Take 1 tablet by mouth three times a day 5)  Nitroquick 0.4 Mg Subl (Nitroglycerin) .... Uad 6)  Omeprazole 20 Mg Tbec (Omeprazole) .... Take 1 tablet by mouth two times a day 7)  Diazepam 5 Mg Tabs (Diazepam) .... Take 1 tablet by mouth three times a day 8)  Citalopram Hydrobromide 20 Mg Tabs (Citalopram hydrobromide) .... One tab by mouth qd 9)  Lovastatin 40 Mg Tabs (Lovastatin) .... Two tabs po  by mouth at bedtime 10)  Lactulose 10 Gm/51ml Soln (Lactulose) .... One tablespoon every four days as needed for constipation 11)  Tramadol Hcl 50 Mg Tabs (Tramadol hcl) .... Take 1 tablet by mouth three times a day 12)  Furosemide 40 Mg Tabs (Furosemide) .... One tab every morning 13)  Silvadene 1 % Crea (Silver sulfadiazine) .... Apply once daily as directed 14)  Duoderm  .... Apply 1 patch every 3 days until healed for stage 2 ulcer to buttocks 15)  Colcrys 0.6 Mg Tabs (Colchicine) .... Take 1 tablet by mouth once a day 16)  Potassium Chloride 20 Meq/9ml (10%) Liqd (Potassium chloride) .... 30 ml. once daily (dose increase) 17)  Benazepril-hydrochlorothiazide 20-25 Mg Tabs (Benazepril-hydrochlorothiazide) .... One halfttablet once daily 18)  Flonase 50 Mcg/act Susp (Fluticasone propionate) .... One spray each nostril daily 19)  Tessalon Perles 100 Mg Caps (Benzonatate) .... Take 1 capsule by mouth two times a day as needed for cough  Other Orders: Re-certification of Home Health [phone])   Orders Added: 1)  Re-certification of Home Health [G0179]

## 2010-11-16 NOTE — Assessment & Plan Note (Signed)
Summary: innjection  Nurse Visit   Vital Signs:  Patient profile:   77 year old female Menstrual status:  postmenopausal BP sitting:   120 / 68  Vitals Entered By: Everitt Amber LPN (November 09, 2010 10:52 AM) Comments Patient in to get lasix per dr Lodema Hong for leg swelling. 10mg  given    Allergies: 1)  ! Asa  Medication Administration  Injection # 1:    Medication: Furosemide- Lasix Injection    Diagnosis: EDEMA LEG (ICD-782.3)    Route: IM    Site: RUOQ gluteus    Exp Date: 07/2011    Lot #: [id number]     Mfr: hospira    Comments: 10mg  given     Patient tolerated injection without complications    Given by: Everitt Amber LPN (November 09, 2010 10:59 AM)  Orders Added: 1)  Furosemide- Lasix Injection [J1940] 2)  Admin of Therapeutic Inj  intramuscular or subcutaneous [96372]   Medication Administration  Injection # 1:    Medication: Furosemide- Lasix Injection    Diagnosis: EDEMA LEG (ICD-782.3)    Route: IM    Site: RUOQ gluteus    Exp Date: 07/2011    Lot #: [id number]     Mfr: hospira    Comments: 10mg  given     Patient tolerated injection without complications    Given by: Everitt Amber LPN (November 09, 2010 10:59 AM)  Orders Added: 1)  Furosemide- Lasix Injection [J1940] 2)  Admin of Therapeutic Inj  intramuscular or subcutaneous [96372]  Pt examioned, she tolerated injection well 2 plus edema on dorsum of feet

## 2010-11-17 LAB — DIFFERENTIAL
Basophils Absolute: 0 10*3/uL (ref 0.0–0.1)
Basophils Relative: 0 % (ref 0–1)
Basophils Relative: 1 % (ref 0–1)
Basophils Relative: 1 % (ref 0–1)
Eosinophils Absolute: 0.2 10*3/uL (ref 0.0–0.7)
Eosinophils Relative: 3 % (ref 0–5)
Lymphocytes Relative: 34 % (ref 12–46)
Lymphs Abs: 1.8 10*3/uL (ref 0.7–4.0)
Lymphs Abs: 2.6 10*3/uL (ref 0.7–4.0)
Monocytes Absolute: 0.6 10*3/uL (ref 0.1–1.0)
Monocytes Absolute: 0.6 10*3/uL (ref 0.1–1.0)
Monocytes Absolute: 0.8 10*3/uL (ref 0.1–1.0)
Monocytes Relative: 10 % (ref 3–12)
Monocytes Relative: 10 % (ref 3–12)
Neutro Abs: 3.5 10*3/uL (ref 1.7–7.7)
Neutro Abs: 4 10*3/uL (ref 1.7–7.7)
Neutrophils Relative %: 52 % (ref 43–77)
Neutrophils Relative %: 57 % (ref 43–77)

## 2010-11-17 LAB — BASIC METABOLIC PANEL
BUN: 20 mg/dL (ref 6–23)
CO2: 37 mEq/L — ABNORMAL HIGH (ref 19–32)
GFR calc non Af Amer: 32 mL/min — ABNORMAL LOW (ref >60)
Glucose, Bld: 132 mg/dL — ABNORMAL HIGH (ref 70–99)
Potassium: 4 mEq/L (ref 3.5–5.1)
Sodium: 138 mEq/L (ref 135–145)

## 2010-11-17 LAB — CBC
HCT: 31.5 % — ABNORMAL LOW (ref 36.0–46.0)
HCT: 34.8 % — ABNORMAL LOW (ref 36.0–46.0)
Hemoglobin: 10.9 g/dL — ABNORMAL LOW (ref 12.0–15.0)
MCHC: 34.6 g/dL (ref 30.0–36.0)
MCV: 87.9 fL (ref 78.0–100.0)
MCV: 88.6 fL (ref 78.0–100.0)
Platelets: 185 10*3/uL (ref 150–400)
Platelets: 195 10*3/uL (ref 150–400)
Platelets: 205 10*3/uL (ref 150–400)
RBC: 3.67 MIL/uL — ABNORMAL LOW (ref 3.87–5.11)
RDW: 13.8 % (ref 11.5–15.5)
RDW: 13.8 % (ref 11.5–15.5)
WBC: 5.7 10*3/uL (ref 4.0–10.5)
WBC: 6 10*3/uL (ref 4.0–10.5)

## 2010-11-17 LAB — COMPREHENSIVE METABOLIC PANEL
ALT: 16 U/L (ref 0–35)
AST: 27 U/L (ref 0–37)
AST: 32 U/L (ref 0–37)
Albumin: 2.9 g/dL — ABNORMAL LOW (ref 3.5–5.2)
Albumin: 3 g/dL — ABNORMAL LOW (ref 3.5–5.2)
Albumin: 3 g/dL — ABNORMAL LOW (ref 3.5–5.2)
Alkaline Phosphatase: 64 U/L (ref 39–117)
Alkaline Phosphatase: 66 U/L (ref 39–117)
Alkaline Phosphatase: 67 U/L (ref 39–117)
BUN: 17 mg/dL (ref 6–23)
BUN: 21 mg/dL (ref 6–23)
CO2: 36 mEq/L — ABNORMAL HIGH (ref 19–32)
Calcium: 9.2 mg/dL (ref 8.4–10.5)
Chloride: 98 mEq/L (ref 96–112)
Creatinine, Ser: 1.45 mg/dL — ABNORMAL HIGH (ref 0.4–1.2)
Creatinine, Ser: 1.57 mg/dL — ABNORMAL HIGH (ref 0.4–1.2)
Creatinine, Ser: 1.79 mg/dL — ABNORMAL HIGH (ref 0.4–1.2)
GFR calc Af Amer: 38 mL/min — ABNORMAL LOW (ref >60)
GFR calc Af Amer: 42 mL/min — ABNORMAL LOW (ref >60)
GFR calc non Af Amer: 31 mL/min — ABNORMAL LOW (ref >60)
Glucose, Bld: 105 mg/dL — ABNORMAL HIGH (ref 70–99)
Potassium: 3.1 mEq/L — ABNORMAL LOW (ref 3.5–5.1)
Potassium: 3.8 mEq/L (ref 3.5–5.1)
Total Bilirubin: 0.7 mg/dL (ref 0.3–1.2)
Total Protein: 6 g/dL (ref 6.0–8.3)
Total Protein: 6.1 g/dL (ref 6.0–8.3)

## 2010-11-17 LAB — CARDIAC PANEL(CRET KIN+CKTOT+MB+TROPI)
Relative Index: 0.8 (ref 0.0–2.5)
Relative Index: 0.8 (ref 0.0–2.5)
Relative Index: 0.9 (ref 0.0–2.5)
Total CK: 432 U/L — ABNORMAL HIGH (ref 7–177)
Total CK: 537 U/L — ABNORMAL HIGH (ref 7–177)
Troponin I: 0.04 ng/mL (ref 0.00–0.06)
Troponin I: 0.05 ng/mL (ref 0.00–0.06)
Troponin I: 0.05 ng/mL (ref 0.00–0.06)

## 2010-11-17 LAB — T4, FREE: Free T4: 1.19 ng/dL (ref 0.80–1.80)

## 2010-11-19 LAB — COMPREHENSIVE METABOLIC PANEL
ALT: 20 U/L (ref 0–35)
AST: 42 U/L — ABNORMAL HIGH (ref 0–37)
Alkaline Phosphatase: 85 U/L (ref 39–117)
CO2: 32 mEq/L (ref 19–32)
Calcium: 9.3 mg/dL (ref 8.4–10.5)
GFR calc Af Amer: 34 mL/min — ABNORMAL LOW (ref >60)
Potassium: 3.1 mEq/L — ABNORMAL LOW (ref 3.5–5.1)
Sodium: 140 mEq/L (ref 135–145)
Total Protein: 6.9 g/dL (ref 6.0–8.3)

## 2010-11-19 LAB — CBC
MCHC: 34 g/dL (ref 30.0–36.0)
RBC: 3.88 MIL/uL (ref 3.87–5.11)

## 2010-11-19 LAB — T3: T3, Total: 91 ng/dl (ref 80.0–204.0)

## 2010-11-19 LAB — POCT CARDIAC MARKERS
CKMB, poc: 13 ng/mL (ref 1.0–8.0)
Myoglobin, poc: >500 ng/mL (ref 12–200)
Troponin i, poc: <0.05 ng/mL (ref 0.00–0.09)
Troponin i, poc: <0.05 ng/mL (ref 0.00–0.09)

## 2010-11-19 LAB — T4: T4, Total: 10.6 ug/dL (ref 5.0–12.5)

## 2010-11-19 LAB — DIFFERENTIAL
Eosinophils Absolute: 0.1 10*3/uL (ref 0.0–0.7)
Eosinophils Relative: 2 % (ref 0–5)
Lymphs Abs: 1.1 10*3/uL (ref 0.7–4.0)
Monocytes Relative: 9 % (ref 3–12)

## 2010-11-19 LAB — TSH: TSH: 0.44 u[IU]/mL (ref 0.350–4.500)

## 2010-11-29 ENCOUNTER — Other Ambulatory Visit: Payer: Self-pay | Admitting: Family Medicine

## 2010-11-30 ENCOUNTER — Encounter: Payer: Self-pay | Admitting: Family Medicine

## 2010-12-01 ENCOUNTER — Encounter: Payer: Self-pay | Admitting: Family Medicine

## 2010-12-02 ENCOUNTER — Ambulatory Visit: Payer: Self-pay | Admitting: Family Medicine

## 2010-12-02 LAB — CBC
HCT: 36.5 % (ref 36.0–46.0)
MCV: 92.1 fL (ref 78.0–100.0)
Platelets: 191 10*3/uL (ref 150–400)
RBC: 3.97 MIL/uL (ref 3.87–5.11)
WBC: 6.2 10*3/uL (ref 4.0–10.5)

## 2010-12-02 LAB — DIFFERENTIAL
Band Neutrophils: 0 % (ref 0–10)
Blasts: 0 %
Eosinophils Absolute: 0.1 10*3/uL (ref 0.0–0.7)
Eosinophils Relative: 1 % (ref 0–5)
Lymphocytes Relative: 27 % (ref 12–46)
Lymphs Abs: 1.7 10*3/uL (ref 0.7–4.0)
Metamyelocytes Relative: 0 %
Monocytes Absolute: 0.5 10*3/uL (ref 0.1–1.0)
Monocytes Relative: 8 % (ref 3–12)
nRBC: 0 /100 WBC

## 2010-12-02 LAB — POCT CARDIAC MARKERS: Troponin i, poc: <0.05 ng/mL (ref 0.00–0.09)

## 2010-12-02 LAB — BASIC METABOLIC PANEL
Chloride: 98 mEq/L (ref 96–112)
GFR calc Af Amer: 38 mL/min — ABNORMAL LOW (ref >60)
GFR calc non Af Amer: 32 mL/min — ABNORMAL LOW (ref >60)
Potassium: 4.3 mEq/L (ref 3.5–5.1)
Sodium: 138 mEq/L (ref 135–145)

## 2010-12-07 ENCOUNTER — Telehealth: Payer: Self-pay | Admitting: Family Medicine

## 2010-12-07 DIAGNOSIS — I10 Essential (primary) hypertension: Secondary | ICD-10-CM

## 2010-12-07 DIAGNOSIS — R5383 Other fatigue: Secondary | ICD-10-CM

## 2010-12-07 DIAGNOSIS — E785 Hyperlipidemia, unspecified: Secondary | ICD-10-CM

## 2010-12-07 NOTE — Telephone Encounter (Signed)
Tsh, fasting chem 7, lipid and hepatic

## 2010-12-07 NOTE — Telephone Encounter (Signed)
What labs need to be ordered for this patient? 

## 2010-12-08 LAB — HEPATIC FUNCTION PANEL
Bilirubin, Direct: 0.1 mg/dL (ref 0.0–0.3)
Indirect Bilirubin: 0.2 mg/dL (ref 0.0–0.9)
Total Bilirubin: 0.3 mg/dL (ref 0.3–1.2)

## 2010-12-08 LAB — LIPID PANEL
HDL: 82 mg/dL (ref >39)
LDL Cholesterol: 117 mg/dL — ABNORMAL HIGH (ref 0–99)
Total CHOL/HDL Ratio: 2.8 Ratio

## 2010-12-08 LAB — BASIC METABOLIC PANEL
CO2: 31 mEq/L (ref 19–32)
Chloride: 98 mEq/L (ref 96–112)
Glucose, Bld: 86 mg/dL (ref 70–99)
Sodium: 141 mEq/L (ref 135–145)

## 2010-12-08 LAB — TSH: TSH: 0.708 u[IU]/mL (ref 0.350–4.500)

## 2010-12-08 NOTE — Telephone Encounter (Signed)
Lab order sent

## 2010-12-14 ENCOUNTER — Encounter: Payer: Self-pay | Admitting: Family Medicine

## 2010-12-15 ENCOUNTER — Ambulatory Visit (INDEPENDENT_AMBULATORY_CARE_PROVIDER_SITE_OTHER): Payer: Medicare Other | Admitting: Family Medicine

## 2010-12-15 ENCOUNTER — Encounter: Payer: Self-pay | Admitting: Family Medicine

## 2010-12-15 VITALS — BP 140/80 | HR 59 | Resp 16

## 2010-12-15 DIAGNOSIS — I10 Essential (primary) hypertension: Secondary | ICD-10-CM

## 2010-12-15 DIAGNOSIS — E785 Hyperlipidemia, unspecified: Secondary | ICD-10-CM

## 2010-12-15 MED ORDER — LOVASTATIN 40 MG PO TABS: 40.0000 mg | ORAL_TABLET | Freq: Every day | ORAL | Status: DC

## 2010-12-15 NOTE — Patient Instructions (Signed)
F/u in 3 months.  Labs look great. PLS start lovastatin 40mg  one every morning.  Fasting lipids in 3 months and chem7.  pls drink 64 ounces water daily

## 2010-12-19 ENCOUNTER — Encounter: Payer: Self-pay | Admitting: Family Medicine

## 2010-12-19 NOTE — Progress Notes (Signed)
  Subjective:    Patient ID: [name], female    DOB: 1922/07/15, 77 y.o.   MRN: [id number]  HPI The PT is here for follow up and re-evaluation of chronic medical conditions, medication management and review of recent lab and radiology data.  Preventive health is updated, specifically  Cancer screening, Osteoporosis screening and Immunization.   Questions or concerns regarding consultations or procedures which the PT has had in the interim are  addressed. The PT denies any adverse reactions to current medications since the last visit.  There are no new concerns.  She has chronic arthritic pain.    Review of Systems Denies recent fever or chills. Denies sinus pressure, nasal congestion, ear pain or sore throat. Denies chest congestion, productive cough or wheezing. Denies chest pains, palpitations, paroxysmal nocturnal dyspnea, orthopnea and leg swelling Denies abdominal pain, nausea, vomiting,diarrhea or constipation.  Denies rectal bleeding or change in bowel movement. Denies dysuria, frequency, hesitancy or incontinence.  Denies headaches, seizure, numbness, or tingling. Denies uncontrolled depression, anxiety or insomnia. Denies skin break down or rash.        Objective:   Physical Exam Patient alert and oriented and in no Cardiopulmonary distress.  HEENT: No facial asymmetry, EOMI, no sinus tenderness, TM's clear, Oropharynx pink and moist.  Neck supple no adenopathy.  Chest: Clear to auscultation bilaterally.  CVS: S1, S2 no murmurs, no S3.  ABD: Soft non tender. Bowel sounds normal.  Ext: No edema  MS: Decreased  ROM spine, shoulders, hips and knees.  Skin: Intact, no ulcerations or rash noted.  Psych: Good eye contact, normal affect. Memory loss, not anxious or depressed appearing.  CNS: CN 2-12 intact, power, tone and sensation normal throughout.        Assessment & Plan:  1.Hypertension:Controlled, no changes in  medication.  2.Osteoarthritis:deteriorated 3.Hyperlipidemia:Hyperlipidemia:Low fat diet discussed and encouraged. Needs to take medication regularly as prescribed, not at goal 4. Depression and anxiety: stable. 5. Dementia; stable, continue meds as before

## 2011-01-11 ENCOUNTER — Other Ambulatory Visit: Payer: Self-pay | Admitting: Family Medicine

## 2011-01-11 NOTE — Letter (Signed)
August 13, 2007    Milus Mallick. Lodema Hong, M.D.  [address]  [place], [place] [address]   RE:  DONNAH, LEVERT  MRN:  [id number]  /  DOB:  [birth year]   Dear Claris Che:   It was my pleasure seeing Ms. Slappey in the office today at your  request.  As you know, she was previously followed by [facility] Has. Dorethea Clan,  M.D. for a variety of issues.  She experienced chest discomfort on a  number of occasions.  Cardiac catheterization was perfectly normal in  2004.  She had a negative stress nuclear study approximately two years  ago.  Nonetheless, she does have multiple cardiovascular risk factors  most notably hypertension and hyperlipidemia.  She was last seen in the  hospital in February of this year when she was evaluated and discharged  for chest discomfort.  She reports more frequent episodes of late.  She  is quite diffuse as a historian.  She describes a sharp epigastric  discomfort whose temporal course, intensity, and associated aspects are  difficult to devine.  She notes lightheadedness and may be experiencing  falls or syncope with spells.  She has associated dyspnea, but has  pretty much anything associated that one asks of her.   CURRENT CARDIAC MEDICATIONS:  1. KCL 20 mEq daily.  2. Lotrel 5/10 mg daily.  3. Lescol 80 mg daily.  4. Acetamide 10 mg daily.  5. Furosemide 20 mg daily.  6. Clopidogrel 75 mg daily.  7. She previously took Aciphex, but does not know what happened to it.   PHYSICAL EXAMINATION:  GENERAL:  Pleasant, overweight woman seated in a  wheelchair in no acute distress.  VITAL SIGNS:  Weight 183, 7 pounds less than in August of 2006.  Blood  pressure 100/60, heart rate 72 and regular, respirations 18.  NECK:  No jugular venous distention; normal carotid upstrokes without  bruits.  LUNGS:  Clear.  HEART:  Normal first and second heart sounds.  Fourth heart sound  present.  ABDOMEN:  Soft and nontender.  No masses, no organomegaly.  EXTREMITIES:  1+ ankle edema with tenderness.  Distal pulses intact.   IMPRESSION:  The patient is doing fairly well.  Her chest discomfort is  atypical.  Rather than proceed with repeating expensive previous  testing, I would like to try to treat her empirically with Aciphex and  sublingual nitroglycerin.  She will keep track of symptoms and return  for reassessment in one month.  She is advised to minimize salt intake  and to maintain leg elevation when possible.  Vaccinations are up to  date.  Thanks so much for sending this nice lady for reassessment by our  practice.    Sincerely,      Gerrit Friends. Dietrich Pates, MD, [facility]  Electronically Signed    RMR/MedQ  DD: 08/13/2007  DT: 08/14/2007  Job #: [id number]

## 2011-01-14 NOTE — Discharge Summary (Signed)
[name], [name]                         ACCOUNT NO.:  [id number]   MEDICAL RECORD NO.:  [id number]                   PATIENT TYPE:  INP   LOCATION:  3710                                 FACILITY:  [facility]   PHYSICIAN:  Charlton Haws, M.D.                  DATE OF BIRTH:  [birth year]   DATE OF ADMISSION:  12/24/2002  DATE OF DISCHARGE:  12/26/2002                           DISCHARGE SUMMARY - REFERRING   DISCHARGE DIAGNOSES:  1. Chest pain, etiology unclear.     a. Cardiac catheterization at this admission revealing normal left        ventricle and normal coronaries.  2. Urinary tract infection.  3. Treated hypertension.  4. Gastritis.  5. History of colon polyps.  6. History of diverticulosis.  7. Status post appendectomy.  8. Senile dementia.  9. Unclear history of myocardial infarction x2 in the 1980s complicated by     sudden cardiac death.  Subsequent workup with cardiac catheterization in     1988 with normal coronaries and normal left ventricular function.     Further workup with evaluation of chest pain in January 2002 with an     echocardiogram revealed inferior/posterior hypokinesis and an ejection     fraction of 50%.  Records of myocardial infarction x2 in the 1980s     unavailable.   HOSPITAL COURSE:  Please see the consultation note by Dr. Vida Roller  from [facility] on December 23, 2002 for complete details.  Briefly,  this 77 year old female with a history of possible myocardial infarction x2  in the 1980s as noted above and dementia was seen at [facility] for  complaints of chest pain.  She had also had symptoms associated with heart  failure that included bilateral lower extremity edema, palpitations,  shortness of breath; however, these symptoms abated.  She continued to have  symptoms of chest pain that were worrisome for angina.  Of note, upon  admission by Dr. Josefine Class to [facility] on December 20, 2002 she had  multiple  complaints.  It was decided by our team that she should be  transferred to [facility] for further evaluation to include cardiac  catheterization.  Of note, her cardiac enzymes did remain negative for a  myocardial infarction.  She was transferred to [facility] on December 24, 2002.  She underwent cardiac catheterization on April 28 by Dr. Charlies Constable.  The angiogram revealed normal coronaries, normal LV with an EF of  60%.  He reassured the patient and called her primary care physician, Dr.  Lodema Hong.  Dr. Dietrich Pates followed up on the patient on April 29 and felt she  was ready for discharge to home.  He did note that she had a mild anemia  with a hemoglobin of 11 and hematocrit of 31.5.  The patient had apparently  been complaining of dysuria.  A urinalysis  was mildly abnormal with positive  leukocyte esterase.  Her urine culture grew out 70,000 colony-forming units  of E. coli.  She was placed on Bactrim DS for seven days.  At the time of  this dictation, the patient is being readied for discharge to home.  Case  management will be asked to see the patient to help with her discharge  planning.  It is notable that Dr. Josefine Class on the patient's admission note  mentioned that she would be unable to manage independently at home.  Because  of this, case management has been asked to see the patient.   LABORATORY AND ANCILLARY DATA:  On December 26, 2002, white blood cell count  was 7700, hemoglobin 10.8, hematocrit 31.5, MCV 90.9, platelet count  207,000.  INR on admission to [facility] 1.1.  D-dimer on April 23  was 0.43.  On April 29, sodium 139, potassium 4.1, CO2 28, glucose 88, BUN  19, creatinine 1.1.  LFT's unremarkable.  Cardiac enzymes as noted above.  TSH 0.583.  Urinalysis as noted above.  Urine culture as noted above.   Chest x-ray on April 23 from [facility]:  No evidence of acute  disease.   DISCHARGE MEDICATIONS:  1. Protonix 40 mg daily (this  is new this admission and was started at [facility].  This medicine is continued at discharge.  The patient     does have a history of gastritis).  2. Aricept 5 mg q.h.s.  3. Lotrel 5/10 mg daily.  4. Diovan 320 mg daily.  5. Bactrim DS b.i.d. for a total of seven days.  6. Clarinex 5 mg daily.  7. Xalatan 0.005% OD q.h.s.  8. Os-Cal D 500 mg daily.  9. Darvocet p.r.n. pain.  10.      Zetia 10 mg q.h.s.  11.      Valium p.r.n. pain.  12.      Lescol 80 mg q.h.s.   The patient advised to stop taking her aspirin, Lasix, and potassium  supplementation.  Her aspirin was discontinued at discharge from [facility].  Her Lasix and potassium were stopped at [facility].   PAIN MANAGEMENT:  The patient can use Tylenol as needed for groin pain.   WOUND CARE:  She should call our office in [place] for any concerns over  groin swelling, bleeding, or bruising.   ACTIVITY:  No heavy lifting or exertion.  No driving for three days.  She is  to slowly advance as tolerated.   DIET:  Low fat, low sodium.   FOLLOW UP:  Followup is with Dr. Lodema Hong in 1-2 weeks.  She should call for  an appointment.  She will to follow up on her chest pain from this  admission, as well as mild anemia which was normocytic, and to make sure  that her urinary tract infection has resolved with Bactrim therapy.     [name], P.A.                        Charlton Haws, M.D.    SW/MEDQ  D:  12/26/2002  T:  12/26/2002  Job:  [id number]   cc:   Milus Mallick. Lodema Hong, M.D.  [address]  [place], [place] [address]  Fax: [phone]   Vida Roller, M.D.  Fax: [phone]

## 2011-01-14 NOTE — Discharge Summary (Signed)
NAME:  [name], [name]                       ACCOUNT NO.:  [id number]   MEDICAL RECORD NO.:  [id number]                   PATIENT TYPE:  INP   LOCATION:  A218                                 FACILITY:  [facility]   PHYSICIAN:  Vania Rea, M.D.              DATE OF BIRTH:  07-15-1922   DATE OF ADMISSION:  02/24/2004  DATE OF DISCHARGE:  02/27/2004                                 DISCHARGE SUMMARY   PRIMARY CARE PHYSICIAN:  Milus Mallick. Lodema Hong, M.D.   DISCHARGE DIAGNOSES:  1. Congestive heart failure, compensated.  2. Hypertension, controlled.  3. Severe dementia.  4. Chronic obstructive pulmonary disease.  5. Rheumatoid arthritis, quiescent.  6. Anxiety disorder, benzodiazepine dependence.   DISPOSITION:  Discharge to home.   DISCHARGE CONDITION:  Stable.   DISCHARGE MEDICATIONS:  1. Lotrel 5/10 daily.  2. Diazepam 5 mg 3 times daily.  3. Lasix 40 mg daily.  4. Diovan 320 mg daily.  5. Combivent inhaler every 4 hours when necessary.   HOSPITAL COURSE:  Please refer to the history and physical of June 28th.  This is an 77 year old lady who is pleasantly demented and is cared for at  home by her children and sitters, who was brought to the emergency room  because of increasing shortness of breath, agitation, and found to be in  congestive heart failure.  Patient was treated with diaphoresis and had the  benefit of a cardiology consult.  The patient's admission weight was 192.3  pounds.  Her discharge weight was 183.5 pounds.  At the time of discharge,  the patient is no longer in respiratory distress.  She does have some mild  respiratory wheeze, and it seems to be more bronchospastic, for which she  has been Combivent.  Her daughter, who is here at the time of discharge,  considers her to be back at her baseline.  Her needs are very simple.  She  is quiet, demented, and out of touch with what we call reality but very  pleasant to relate to.   PHYSICAL EXAMINATION:  VITAL  SIGNS:  At the time of discharge, her vitals  are temperature 97.3, pulse 62, respirations 20, blood pressure 101/44.  CHEST:  She is not tachypneic, but she does have mild wheezing on her chest.  EXTREMITIES:  Trace of edema.   Her serum chemistry this morning showed a BUN of 34 and a creatinine of 1.6.  Sodium 141, potassium 4.4.  Her CHF is assessed as being now compensated.  She is discharged home in the care of her daughter.  She is to follow up  with her primary care physician, Dr. Syliva Overman.  She has been advised  via her daughter to get a chem-7 on July 6th.  She has been advised to weigh  herself daily and to take an extra dose of Lasix if her weight goes above  185 pounds.  Home healthcare and physical therapy  as an outpatient.     ___________________________________________                                         Vania Rea, M.D.   LC/MEDQ  D:  03/04/2004  T:  03/04/2004  Job:  [id number]

## 2011-01-14 NOTE — H&P (Signed)
NAME:  [name], [name]                       ACCOUNT NO.:  [id number]   MEDICAL RECORD NO.:  [id number]                   PATIENT TYPE:  INP   LOCATION:  A227                                 FACILITY:  [facility]   PHYSICIAN:  Marden Noble, M.D.                DATE OF BIRTH:  1922-03-16   DATE OF ADMISSION:  03/12/2003  DATE OF DISCHARGE:                                HISTORY & PHYSICAL   HISTORY OF PRESENT ILLNESS:  [name] is a 77 year old African-American  female with a history of hypertension, hyperlipidemia, senile dementia who  presents from her primary care physician's office, Dr. Lodema Hong, because of  lower extremity swelling which has been resistant to outpatient management.  The patient has been tried on several diuretics including Lasix and  Zaroxolyn, however without much improvement of her lower extremity swelling,  and resulting in prerenal azotemia.  The patient has had this lower  extremity swelling for approximately two months.  The patient states that it  is a tight feeling in her legs.  She has had a productive cough for the last  3-4 days with pleuritic chest pain.  The patient has had subjective chills,  no nausea, vomiting, regular bowel habits, good appetite, chronic urinary  incontinence which has not changed.   PAST SURGICAL HISTORY:  1. Tonsillectomy.  2. Appendectomy.  3. Catheterization done 11/2002 which showed normal coronary angiography and     a normal left ventricular function of 60-70.   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   PAST MEDICAL HISTORY:  1. Urinary incontinence.  2. Gastritis.  3. Colon polyps.  4. Diverticulosis.  5. Senile dementia.  6. Hypertension.  7. Previous urinary tract infection.   ALLERGIES:  ASPIRIN.   MEDICATIONS:  1. Protonix 40 mg p.o. daily.  2. Aricept 5 mg p.o. daily.  3. Lotrel 5/10 p.o. daily.  4. Diovan 320  mg p.o. daily.  5. Clarinex 5 mg p.o. daily.  6. Xalatan 0.005% in right eye at bedtime.  7. Os-Cal D 500 p.o. daily.  8. Darvocet as needed.  9. Zetia 10 mg p.o. q.h.s.  10.      Valium as needed.  11.      Lescol 80 p.o. daily.  The patient was advised upon discharge from [facility] to stop  taking her aspirin, Lasix and potassium, however because of her chronic  lower extremity swelling, the patient has been placed back on her Lasix by  her primary care physician.   REVIEW OF SYSTEMS:  The patient denies any headache, increase ni blurry  vision, epistaxis, rhinorrhea, odynophagia, dysphagia.  She has a very good  appetite.  No abdominal pain.  No constipation or diarrhea.  All other  systems were reviewed and were negative.   PHYSICAL EXAMINATION:  VITAL SIGNS:  Temperature 96.9, pulse 48,  respirations 20, blood pressure 151/72, weight is 168.  GENERAL:  She is in no acute distress.  Well-developed, well-nourished.  HEENT:  Atraumatic and normocephalic.  Extraocular muscles are intact.  Sclerae anicteric.  Conjunctivae is moist.  Eyelids without abnormalities.  Nares had no discharge or lesions.  Oropharynx was moist.  She is  edentulous.  No lesions in her mouth.  No gross abnormalities of her tongue.  NECK:  Supple, no lymphadenopathy, JVD, thyromegaly.  No carotid bruits.  HEART:  Regular rate and rhythm without murmurs, rubs or gallops.  LUNGS:  Clear to auscultation bilaterally without any wheezes, rales or  rhonchi.  ABDOMEN:  Soft, nontender, nondistended, no rebound or guarding.  EXTREMITIES:  2+ pitting edema bilaterally to her knees, palpable dorsalis  pedis pulse.  MUSCULOSKELETAL:  The patient had some pain with adduction of her shoulders  bilaterally.  She had difficulty lifting them above her shoulder line.  There was no effusion in any of her joints.  The patient had some pain with  palpation of her lower extremities.  There are no cords present.  No  calf  tenderness.  No erythema nor increased warmth.  NEUROLOGIC:  Nonfocal exam.   LABORATORY DATA:  BMP was all within normal limits.  BUN 21, creatinine 1.1.  Sodium 139, potassium 4.2.   IMPRESSION AND PLAN:  1. Bilateral lower extremity swelling with a negative catheterization done a     couple of months ago.  Probably secondary to venous insufficiency.  We     will give the patient intravenous Lasix, do daily weights and follow her     BUN and creatinine.  2. Hypertension.  Continue her outpatient medications except for the Norvasc     that is in the Lotrel because calcium channel blockers can occasionally     cause some fluid retention.  3. Gastritis.  We will continue her Protonix.  4. Senile dementia.  We will continue her Aricept.  5. Hyperlipidemia.  We will continue her Lescol.                                               Marden Noble, M.D.    JD/MEDQ  D:  03/12/2003  T:  03/12/2003  Job:  [id number]

## 2011-01-14 NOTE — Cardiovascular Report (Signed)
[name], [name]                         ACCOUNT NO.:  [id number]   MEDICAL RECORD NO.:  [id number]                   PATIENT TYPE:  INP   LOCATION:  3710                                 FACILITY:  [facility]   PHYSICIAN:  Charlies Constable, M.D.                  DATE OF BIRTH:  1922/01/08   DATE OF PROCEDURE:  12/25/2002  DATE OF DISCHARGE:  12/26/2002                              CARDIAC CATHETERIZATION   CLINICAL HISTORY:  The patient is 77 years old and has a history of a normal  catheterization in 1988.  She also has had hypertension, hyperlipidemia, and  mild dementia.  She recently developed symptoms of chest pain concerning for  ischemia and was seen in consultation by Dr. Dorethea Clan who arranged for her to  come here for evaluation with angiography.   DESCRIPTION OF PROCEDURE:  The procedure was performed via the right femoral  artery using an arterial sheath and 6-French preformed coronary catheters.  A front wall arterial puncture was performed, and Omnipaque contrast was  used.  The patient tolerated the procedure well and left the laboratory in  satisfactory condition.   RESULTS:  1. Left main:  The left main coronary artery was free of significant     disease.  2. Left anterior descending artery:  The left anterior descending artery     gave rise to 2 diagonal branches and 2 septal perforators.  These and the     LAD proper were free of significant disease.  3. Left circumflex artery:  The left circumflex artery gave rise to a     marginal branch, posterolateral, and a posterior descending branch.     These vessels were free of significant disease.  4. Right coronary artery:  The right coronary artery was a nondominant     vessel that supplied 2 right ventricular branches.  These were free of     significant disease.   LEFT VENTRICULOGRAPHY:  The left ventriculogram, performed in the RAO  projection, showed good wall motion with no areas of hypokinesis.  The  estimated  ejection fraction was 60% to 70.   PRESSURES:  1. Aortic pressure was 168/57 with a mean of 107.  2. Left ventricular pressure was 168/19.   CONCLUSIONS:  Normal coronary angiography and left ventricular function.    RECOMMENDATIONS:  Reassurance.  Will plan to keep the patient tonight and  discharge the patient tomorrow with followup with Dr. Lodema Hong in 1 week.                                               Charlies Constable, M.D.    BB/MEDQ  D:  12/25/2002  T:  12/26/2002  Job:  [id number]   cc:   Milus Mallick. Lodema Hong, M.D.  [address]  [address]  [place], [place] [address]  Fax: [phone]   Vida Roller, M.D.  Fax: [phone]   Cardiac Catheterization Lab

## 2011-01-14 NOTE — Discharge Summary (Signed)
[name], [name]             ACCOUNT NO.:  [id number]   MEDICAL RECORD NO.:  [id number]          PATIENT TYPE:  INP   LOCATION:  A217                          FACILITY:  [facility]   PHYSICIAN:  Hanley Hays. Dechurch, M.D.DATE OF BIRTH:  10-26-1921   DATE OF ADMISSION:  10/22/2005  DATE OF DISCHARGE:  02/27/2007LH                                 DISCHARGE SUMMARY   DIAGNOSES:  1.  Left-sided weakness, possible dystonic reaction from Reglan.  2.  History of chest pain.  3.  History of hypertension.  4.  History of congestive heart failure.  5.  History of asthma.  6.  History of gout.  7.  Chronic venous stasis.  8.  Helicobacter pylori gastritis.  9.  Gastroesophageal reflux with recent EGD status post dilatation.   MEDICATIONS:  The patient will resume her usual medications including Lotrel  5/10 daily, Lexapro 10 daily, Aciphex 20 daily, Aricept 10 daily, Os-Cal D  t.i.d., Darvocet-N 100 p.r.n. pain, Plavix 75 mg daily and glaucoma drops.  She will also complete a course of Biaxin and Amoxil for Helicobacter pylori  and was instructed to stop the Reglan.   FOLLOW UP:  She will follow up with Dr. Lodema Hong in 2 weeks and Dr. Katrinka Blazing as  scheduled   HOSPITAL COURSE:  The patient is a pleasant 77 year old African American  female followed by Dr. Syliva Overman with multiple medical problems who  is managed at home with full-time caregivers. She was in her usual state of  health until the day of admission when she apparently awakened and stated  she could not talk and could not walk secondary to left-sided weakness. She  states it seemed like it had been coming on over the course of the last day.  She normally walked short distances in the house using a cane or walker. Her  initial exam revealed 4/5 strength on the left side. She was fluent verbally  and otherwise unremarkable. She had complained of some chest pain as well  and underwent a CT scan of the chest which revealed no  evidence of pulmonary  embolus or other significant findings. Initial CT of the brain revealed  chronic white matter disease and no acute findings. An ultrasound of the  abdomen was completed as well because of complaints of abdominal pain and  chest pain which revealed no significant findings. Because of the concern  regarding whether this represented a stroke she also underwent MRI/MRA which  revealed chronic small vessel ischemic changes in the white matter and pons  and no acute abnormalities. The MRA revealed mild to moderate stenosis of  the right MCA but no evidence of acute stroke, her vertebral arteries were  widely patent as was the carotid bifurcation. The patient had been started  on Reglan several days prior to these symptoms developing and it was thought  that this may represent a dystonic type reaction. The Reglan had been held  and her symptoms resolved totally. She is walking 200 feet with a walker  with physical  therapy, eating her diet as usual and otherwise clinically stable. Her  potassium  did decrease to 3.3 with hydration and was supplemented and is 4.5  on the day of discharge. She is being discharged to home with her usual care  arrangements and the medication regimen as noted above in stable condition.      Hanley Hays Josefine Class, M.D.  Electronically Signed     FED/MEDQ  D:  10/25/2005  T:  10/25/2005  Job:  [id number]   cc:   Milus Mallick. Lodema Hong, M.D.  Fax: [phone]   Dr. Katrinka Blazing

## 2011-01-14 NOTE — Discharge Summary (Signed)
[name], [name]             ACCOUNT NO.:  [id number]   MEDICAL RECORD NO.:  [id number]          PATIENT TYPE:  OBV   LOCATION:  A225                          FACILITY:  [facility]   PHYSICIAN:  Osvaldo Shipper, MD     DATE OF BIRTH:  01/11/1922   DATE OF ADMISSION:  06/20/2005  DATE OF DISCHARGE:  10/25/2006LH                                 DISCHARGE SUMMARY   DISCHARGE DIAGNOSES:  1.  Fall, query secondary to syncopal episode, query secondary to gait      problem.  2.  Lower extremity edema.  3.  Hypertension.  4.  Dementia.   HISTORY:  Please refer to H&P dictated at the time of admission for details  regarding the patient's presenting illness.   BRIEF HOSPITAL COURSE:  Briefly, this is an 77 year old African American  female with a past medical history of dementia, hypertension, lower  extremity edema, who was doing well until last Wednesday, when she  apparently had a fall.  The patient was unable to give me a good history,  but she tells me that she might have passed out.  Since Wednesday, she has  not had any more episodes; however, she was having significant pain in both  her legs and as a result of which the patient had not been ambulating at  home.   In the  ER, the patient appeared to be very depressed and was in tears.  She  was barely able to get up for me from the bed and she was complaining of  significant pain in both her legs.   We obtained x-rays of her lower extremities, knees, legs as well as ankles,  all of which have not shown any kind of fractures or trauma.  We also  obtained venous Doppler which did not reveal any DVTs.  The patient was put  back on Lasix with which her edema has improved and today the patient was  able to ambulate about 200 feet with a walker.  The patient has been feeling  better and better since admission.  Today, she is completely asymptomatic.   CT of head was done which was negative for any acute stroke.  Neurologic  examination was nonfocal.  Cardiac enzymes were checked which were negative.  Telemetry was without any events of arrhythmia.  Her blood pressure has been  stable in the hospital.  Carotid dopplers did not show any significant  disease.   Today, her creatinine has gone up slightly to 1.3.  Review of labs reveal  her creatinine has been as high as 1.6 the previous year.  The patient has  maintained her urine output.  Her other lab parameters are all within normal  limits.  Based on the above, I think the patient is stable to go home.  We  will recommend the patient to have a BMP checked this Friday to make sure  her creatinine and her electrolytes are staying within normal limits.  One  reason for elevated creatinine is probably a higher dose of Lasix that the  patient was started on in the hospital.  DISCHARGE MEDICATIONS:  Lasix 20 mg p.o. once daily. ***Please note changed  dose***.   Otherwise, I am not making any other changes to any of her other medications  at this time.   FOLLOWUP:  1.  Follow up with Dr. Syliva Overman on either June 27, 2005 or the      31st, appointment to be made from here.  2.  A BMP to be checked on this Friday, that is June 24, 2005, by home      health and results to be forwarded to Dr. Anthony Sar office.  3.  Home health visit by RN to be arranged by case manager starting      tomorrow.   DIET:  Low salt.   PHYSICAL ACTIVITY:  Patient to use either a cane or walker and has been  cleared by PT over here.   IMAGING STUDIES:  The patient had multiple imaging studies including x-ray  of the chest, tibiae, fibulae, right and left knees, right and left ankles,  all of which did not show any acute fractures; they did show some  degenerative disease.   The patient also had x-ray of her lumbosacral spine which also did not show  any acute abnormality; however, moderate-to-severe degenerative disk disease  and spondylosis were noted throughout  the lumbar spine.  Degenerative  changes were also seen in bilateral hips.   The patient also had a CT of head which was negative for any acute event.   Chest x-ray did not also show any acute abnormality; it did show some  cardiomegaly.   Echo was not ordered as the patient had a recent echo back in July of this  year.   CONSULTATIONS:  No consultations were done during this hospital stay.   COMMENT:  Her other issues including dementia, high blood pressure, etc,  remained stable.      Osvaldo Shipper, MD  Electronically Signed     GK/MEDQ  D:  06/22/2005  T:  06/22/2005  Job:  [id number]   cc:   Milus Mallick. Lodema Hong, M.D.  Fax: [phone]

## 2011-01-14 NOTE — H&P (Signed)
[name], [name]                         ACCOUNT NO.:  [id number]   MEDICAL RECORD NO.:  [id number]                   PATIENT TYPE:  INP   LOCATION:  A210                                 FACILITY:  [facility]   PHYSICIAN:  Hanley Hays. Dechurch, M.D.           DATE OF BIRTH:  01-14-1922   DATE OF ADMISSION:  12/20/2002  DATE OF DISCHARGE:                                HISTORY & PHYSICAL   HISTORY OF PRESENT ILLNESS:  The patient is a 76 year old African American  female followed by Dr. Josefine Class with a history of coronary artery disease,  reportedly with an MI x 2, followed by [facility] who lives alone  with three hours of nursing per day.  She notes that she stays in the bed  all day.  She is able to get from bed to chair and sits in a wheelchair all  night.  Initially she was complaining of urinary frequency then began to  complain of chest pain which apparently was relieved with one sublingual  nitroglycerin.  She then proceeded to complain of abdominal pain and back  pain and now complaining that she is paralyzed in her right side and  occasionally in her left side.  Overall she says she is unable to walk or  care for herself.  She states she fell today and now is also having back  pain to the point that she can not move or walk though she was able to walk  to the rest room earlier with minimal assistance.  She is also complaining  of constipation apparently now with urinary incontinence.  In addition she  notes left lower extremity  pain that is so severe that she can not walk  unassisted.  In any event it is very clear this patient can not manage  independently at home.  She has a history of dementia.  She is on Aricept  though she is able to provide some information, it is very difficult to  follow.   MEDICATIONS:  1. Aricept 5 daily.  2. Lotrel 5/10 daily.  3. KCl 20 daily.  4. Aspirin 81 daily.  5. Diovan 320 daily.  6. Clarinex 5 daily.  7. Xalatan 0.005% in  the right eye q.h.s.  8. Os-Cal-D 500 daily.  9. Darvocet-N 100 p.r.n.  10.      Zetia 10 mg q.h.s.  11.      Valium 5 mg t.i.d. p.r.n.  12.      Lescol XL 80 mg q.h.s.   ALLERGIES:  She denies.   SOCIAL HISTORY:  She is divorced x 2.  She has one daughter who apparently  lives in [place].  No alcohol or tobacco abuse.   PAST MEDICAL HISTORY:  1. Coronary artery disease.  She had an echo in 2002 which revealed inferior     posterior hypokinesis, EF 50%.  2. Hypertension.  3. Gastritis.  4. Colon polyps.  5. Diverticulosis.  6. Question of renal insufficiency with a BUN of 29, creatinine 1.1 in 2002.  7. She states she has had a cerebrovascular accident though she had  normal     magnetic resonance imaging and no focal deficits.  8. She has a history of appendectomy.   PHYSICAL EXAMINATION:  GENERAL:  The patient keeps her eyes closed.  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  She is quite anxious.  She becomes  tearful at times and very agitated.  She does follow commands.  She is  actually moving about on the bed unassisted but when asked to move in the  bed she states she is unable to because of her paralysis.  VITAL SIGNS:  Her blood pressure is 126/63, temperature 97.1, pulse 60.  NECK:  Supple.  There is no JVD or adenopathy.  HEENT:  The patient is edentulous.  LUNGS:  Diminished but clear bilaterally.  HEART:  Regular, rate and rhythm.  No murmur, gallop or rub.  ABDOMEN:  Obese, soft.  Under the paniculus, there actually is a surgical  scar in the midline.  She has very marked tenderness to palpation of the  right lower quadrant though no mass.  She is very tender to touch and  becomes agitated and pushes way.  EXTREMITIES:  Chronic stasis changes.  She has an asymmetric femur the left  being slightly greater then the right with trace edema at best.  SKIN:  Without rash, lesion or breakdown.  NEUROLOGIC:  She withdraws all extremities.  She moves them all again  when  not being observed.  She can lift her legs off the bed as well as arms.  Her  grasps are equal bilaterally.  Pupils are reactive to light.  Gag and palate  seem normal .  Tongue protrudes midline.   ASSESSMENT/PLAN:  1. Chest pain relieved with nitroglycerin though very atypical in nature.     She states it is in her left and right chest and it is right moving on a     regular basis again helped with nitroglycerin.  Again her history is     somewhat limited secondary to her mental status but certainly she     deserves some further evaluation.  2. Right lower quadrant pain with known diverticulosis, question     diverticulitis.  She states she has been constipated for the last several     days.  Will monitor and consider further treatment evaluation if     indicated.  The belly is soft at this time with bowel sounds.  3. Failure to thrive with limited support system at home.  This patient is     going to need much more support then has been mustered.  She states she     needs help and is willing to proceed with placement.  We will have social     services evaluate and monitor the patient.  4. Hypertension reasonably controlled with her current regimen.                                                 Hanley Hays Josefine Class, M.D.    FED/MEDQ  D:  12/21/2002  T:  12/21/2002  Job:  [id number]

## 2011-01-14 NOTE — Procedures (Signed)
NAME:  [name], [name]                       ACCOUNT NO.:  [id number]   MEDICAL RECORD NO.:  [id number]                   PATIENT TYPE:  INP   LOCATION:  A218                                 FACILITY:  [facility]   PHYSICIAN:  Tensas Bing, M.D.               DATE OF BIRTH:  Oct 15, 1921   DATE OF PROCEDURE:  02/25/2004  DATE OF DISCHARGE:                                  ECHOCARDIOGRAM   CONSULTING PHYSICIAN:  [place] Bing, M.D.   REFERRING PHYSICIAN:  Hanley Hays. Josefine Class, M.D.   CLINICAL DATA:  An 77 year old woman with congestive heart failure, TIA, and  hypertension, possible prior myocardial infarction.   M-mode:  Aorta 2.7, left atrium 3.9, septum 1.4, posterior wall 1.4, LV  diastole 3.2, LV systole 2.1.   1. Technically adequate echocardiographic study.  2. Normal left and right atrial size; normal right ventricular size and     function.  3. Mild aortic valvular sclerosis; mild calcification of the proximal     ascending aorta.  4. Normal mitral valve; mild annular calcification.  5. Normal tricuspid valve; trivial regurgitation.  6. Normal pulmonic valve.  7. Normal left ventricular size; borderline LVH; normal to hyperdynamic     regional and global LV systolic function.  8. Normal IVC.      ___________________________________________                                            [place] Bing, M.D.   RR/MEDQ  D:  02/25/2004  T:  02/25/2004  Job:  [id number]

## 2011-01-14 NOTE — H&P (Signed)
[name], [name] NO.:  [id number]   MEDICAL RECORD NO.:  [id number]          PATIENT TYPE:  EMS   LOCATION:  ED                            FACILITY:  [facility]   PHYSICIAN:  Osvaldo Shipper, MD     DATE OF BIRTH:  [birth year]   DATE OF ADMISSION:  10/22/2005  DATE OF DISCHARGE:  LH                                HISTORY & PHYSICAL   PRIVATE MEDICAL DOCTOR:  Milus Mallick. Lodema Hong, M.D.   ADMITTING DIAGNOSES:  1.  Transient aphasia, inability to move.  2.  Rule out cerebrovascular accident.  3.  Chest pain, rule out pulmonary embolism and acute coronary syndrome.  4.  Possible dystonic reaction to Reglan.  5.  History of hypertension.  6.  History of congestive heart failure.  7.  History of asthma.  8.  History of gout.  9.  History of chronic lower extremity edema.  10. Recent Helicobacter pylori gastritis.   CHIEF COMPLAINT:  Inability to talk, chest pain, shortness of breath since  last night.   HISTORY OF PRESENT ILLNESS:  The patient is an 77 year old African-American  female who lives alone and who has medical problems as outlined above who  was doing well until last night when she started having some cough with  clear expectoration.  This was along with some chest pain which was  described as sharp chest pain, 10/10 intensity in the left side of her chest  with no radiation.  The patient slept through the night through her  symptoms.  This morning when she tried to get up she realized she could not  talk for a few minutes.  She could also not move any of her extremities for  a brief period of time.  Subsequently, she regained some of her strength and  she could make a phone call to her daughter who sent an aide in who got the  patient into the hospital.  The patient currently is denying any chest pain.  She denies any syncopal episodes.  She did mention some history of  lightheadedness.  No visual symptoms described.  No nausea, vomiting.  The  patient  has been having some abdominal pain for some time.  Denies any  urinary complaints.  Denies any urinary incontinence or bowel incontinence.  Denies any seizure-type activity.  In all, the patient is a very poor  historian, unable to give me a good history.   MEDICATIONS AT HOME:  The patient does not know the names of her  medications.  She was recently put on metoclopramide, Robaxin, and  amoxicillin by Dr. Katrinka Blazing for an infection of her stomach, presumably the H.  pylori.  This was started just two or three days ago.   She also takes some eye drops for her glaucoma, possibly Xalatan.   She also takes Lasix 20 mg p.o. daily.   There is a list from another previous summary which suggests that the  patient is on Os-Cal plus D three times a day, Darvocet-N 100 q.4h. p.r.n.,  potassium chloride 40 mEq daily, Diovan 325 mg daily, however, she tells  me  that this has been discontinued, Lotrel 5 or 10 daily, citalopram 10 mg  daily, AcipHex 30 mg daily, Aricept 10 mg daily, Lamisil p.r.n., Clarinex.   ALLERGIES:  The patient's chart support an allergy to ASPIRIN, unknown  reaction.   PAST MEDICAL HISTORY:  History of CHF, however, based on a stress test which  was done back in August of 2006 her EF at that test was recorded at 66%.  She also carries a diagnosis of TIAs in the past, hypertension, asthma,  arthritis, gout, glaucoma.  She denies any surgeries in the past.  She also  has a history of EGD which was done actually on October 06, 2005 by Dr.  Katrinka Blazing and this study showed acute gastritis of the antrum and body of  stomach.  Biopsies were taken and biopsy was reported as positive for H.  pylori.  The patient also has been having some dysphagia for the past month  or so for which she has also been under evaluation by her PMD.   SOCIAL HISTORY:  The patient lives in [place], lives alone.  She gets an  aide on weekdays but no one on weekends.  Her daughter lives close by and  checks  on her periodically.  No smoking history, no alcohol history, no  illicit drug use.  She uses a cane, walker, and wheelchair for ambulation  purposes.   FAMILY HISTORY:  Noncontributory.   REVIEW OF SYSTEMS:  A 10 point review of systems was done which was  unremarkable except as mentioned in the HPI.   PHYSICAL EXAMINATION:  VITAL SIGNS: Temperature 97.7, blood pressure 138/60,  heart rate 63, respiratory rate 16, saturating 100%.  GENERAL: This is an elderly female, anxious but in no distress.  HEENT: There is no pallor or icterus.  Mucous membranes are moist.  No  lesions are seen.  NECK: Soft, supple.  No thyromegaly is appreciated.  CARDIOVASCULAR: S1, S2 is normal, regular.  There is a systolic murmur at  the apex.  No S3, S4 is heard.  No carotid bruits are heard.  LUNGS: Clear to auscultation bilaterally.  ABDOMEN: Nondistended abdomen.  Bowel sounds are present.  There is diffuse,  vague tenderness all over the abdomen, most on the epigastric region.  No  mass or organomegaly appreciated.  EXTREMITIES: Chronic edema.  There is tenderness over the lower extremities.  No clear cut erythema is appreciated.  Pulses are palpable.  NEUROLOGIC: The patient is alert and oriented x3.  No cranial nerve deficits  appreciated at this time.  Motor strength of upper extremities, left side is  at least 4/5 in strength.  Right side is 5/5.  Right lower extremity again  is at least 4-5/5.  Left lower extremity is 4/5.  Plantars are equivocal.  Cerebellar signs could not be attempted because the patient is not  cooperating well.   LABORATORY STUDIES:  Her CBC is completely unremarkable.  CMP shows alkaline  phosphatase 140, other parameters are unremarkable.  Initial set of cardiac  enzymes negative.  BNP is less than 30.   UA shows trace blood, a few squamous epithelial, many bacteria, 7-10 wbc's,  negative for nitrite, leukocytes.   EKG shows sinus rhythm at the rate of 63.  Left axis  deviation is present.  Some nonspecific T-wave changes are noted compared to previous EKG.  No  other new abnormalities appreciated.   Imaging studies: The patient had a chest x-ray done which was unremarkable  for  any acute findings.  The patient also had a CAT scan of the head which  showed no acute intracranial findings and stable compared to prior exams  with chronic microvascular white matter disease.   IMPRESSION:  This is an 77 year old African-American female with medical  problems as outlined before who presents with transient aphasia and  inability to move her upper and lower extremities.  Differential diagnoses  at this time are varied and include possible transient ischemic attack.  Other differential includes acute dystonic reaction because of reglan.  The  patient was noted to have some tremors of her lips as well.  Cerebrovascular  accident is also a possibility.  The patient also gives a history of sharp  pleuritic chest pain of the left side last night.  Possibility for that  includes pulmonary embolus and bronchitis.  Acute coronary syndrome is less  likely.   PLAN:  1.  Neurologically: We will admit the patient to telemetry, get an MRI/MRA.      The patient recently had an MR brain back in January of 2007.  These did      not show any acute findings, chronic small vessel changes were noted.      She also had a carotid Doppler on January 8 which also did not show any      significant stenosis.  We will repeat an MRI and MRA.  We will not      repeat a carotid Doppler at this time.  Since the patient is intolerant      of aspirin, we will put her on Plavix.  Neurologic consultation will be      considered.  Physical therapy will be involved in this patient's care.      Telemetry will be initiated.  2.  Chest pain.  D-dimer will be checked initially to screen for PE.  The      patient's cardiac enzymes will be obtained to rule out for acute      coronary syndrome.   Though she did have a catheterization done back in      2004 which showed normal coronaries.  3.  Hypertension.  We will continue her Lotrel at this time.  We will have      the family bring in her complete medication list.  4.  The patient was recently diagnosed with H. pylori gastritis.  We will      initiate treatment when the patient is ready for discharge.  I will      obviously hold her metoclopramide at this time.   Further management decisions will be based on results of initial testing and  patient's response to treatment.      Osvaldo Shipper, MD  Electronically Signed     GK/MEDQ  D:  10/22/2005  T:  10/22/2005  Job:  [id number]   cc:   Milus Mallick. Lodema Hong, M.D.  Fax: [phone]

## 2011-01-14 NOTE — Procedures (Signed)
NAMERENISHA, COCKRUM NO.:  [id number]   MEDICAL RECORD NO.:  [id number]          PATIENT TYPE:  INP   LOCATION:  A217                          FACILITY:  [facility]   PHYSICIAN:  Vida Roller, M.D.   DATE OF BIRTH:  12-06-1921   DATE OF PROCEDURE:  10/24/2005  DATE OF DISCHARGE:                                  ECHOCARDIOGRAM   REFERRING PHYSICIAN:  Osvaldo Shipper, M.D.   TAPE NUMBER:  [id number].  Tape count [id number].   INDICATIONS:  This is an evaluation for chest pain in a patient with a CVA,  hypertension and congestive heart failure.  Last echocardiogram was done in  July 2006, showing normal LV systolic function.  Today's study is  technically difficult.   M-MODE TRACINGS:  The aorta is 31 mm.   The left atrium is 39 mm.   Septum is 12 mm.   Posterior wall 11 mm.   Left ventricular systolic dimension 27 mm.   Left ventricular diastolic dimension 39 mm.   Two-dimensional echocardiogram and Doppler imaging:  The left ventricle is  normal size.  There is preserved LV systolic function with an estimated  ejection fraction of 60-65%.  There is mild left ventricular hypertrophy  seen.  There is no obvious wall motion abnormalities.   The right ventricle appears to be dilated.  There is normal systolic  function.   Both atria appear to be top normal in size.   The aortic valve is sclerotic and there is no stenosis.  Trivial  insufficiency is seen.   The mitral valve has mild regurgitation.   Tricuspid valve has mild regurgitation.   There is no pericardial effusion.      Vida Roller, M.D.  Electronically Signed     JH/MEDQ  D:  10/24/2005  T:  10/25/2005  Job:  [id number]

## 2011-01-14 NOTE — Discharge Summary (Signed)
NAME:  [name]                       ACCOUNT NO.:  [id number]   MEDICAL RECORD NO.:  [id number]                   PATIENT TYPE:  INP   LOCATION:  A227                                 FACILITY:  [facility]   PHYSICIAN:  Sarita Bottom, M.D.                  DATE OF BIRTH:  Feb 13, 1922   DATE OF ADMISSION:  03/12/2003  DATE OF DISCHARGE:  03/14/2003                                 DISCHARGE SUMMARY   DISCHARGE DIAGNOSES:  1. Bilateral pedal edema, probably secondary to venous insufficiency.  2. Hypertension.  3. Other chronic problems include gastritis with hyperlipidemia and     dementia.   DISCHARGE MEDICATIONS:  Patient is to continue with preadmission  medications:  1. Aricept.  2. Diovan.  3. Protonix.  4. Lasix.  5. Patient will be discharged home on K-Dur 10 mEq daily.  6. Patient advised to hold Lotrel until further reading by her primary M.D.  7. Other discharge medications include Darvocet-N.  8. Zetia.  9. __________ as needed.  10.      Os-Cal.  11.      Xalatan eye drops.  12.      Clarinex 5 mg p.o. daily.   RECOMMENDED FOLLOW UP:  1. Monitor patient's BUN and creatinine on Lasix.  2. Also to monitor patient's serum potassium level on Lasix.   DISCHARGE CONDITION:  Stable, satisfactory.   HISTORY OF PRESENT ILLNESS:  [name] is a 77 year old lady with  a history of hypertension, hyperlipidemia who was admitted on March 12, 2003,  from her primary doctor's office after she presented with persistent pedal  edema, which was not responding to oral diuretics.  Pertinent findings on  admission showed a BP of 151/72, temperature was 96.9, heart rate of 48.  Generally, she was an elderly lady not in any distress.  Chest was clear to  auscultation.  Heart sounds 1 and 2 were normal.  Rhythm was regular.  Extremities had 2+ pedal edema.  Her admission labs showed a BUN of 21 and  creatinine of 1.1, sodium was 139, potassium was 4.2.  Patient was  admitted  with an impression of bilateral pedal edema and hypertension.   HOSPITAL COURSE:  She was treated with IV Lasix.  Her BUN and creatinine  were monitored on this admission.  Her clinical course was remarkable for  reduction of the pedal edema.  Her edema was attributed to possible venous  insufficiency on account of a negative cardiac catheterization, and ejection  fraction was 60%.  Patient had been seen on rounds today; she has no new  complaints, denies any chest pain or palpitations, and wants to be  discharged home.   PHYSICAL EXAMINATION:  VITAL SIGNS:  This morning, BP of 106/61, heart rate  of 59, temperature of 97.0.  CHEST:  Pertinent findings show a clear chest auscultation.  Heart sounds 1  and 2 are normal.  Rhythm is regular.  EXTREMITIES:  Pedal edema is resolved.   LABORATORY DATA:  Her latest labs show a sodium of 138, potassium of 3.7,  BUN is 33, creatinine is 1.4, glucose is 103, her calcium is 9.1.   DISPOSITION:  Patient is stable for discharge and will be discharged home  today.                                               Sarita Bottom, M.D.    DW/MEDQ  D:  03/14/2003  T:  03/15/2003  Job:  [id number]   cc:   Milus Mallick. Lodema Hong, M.D.  [address]  [place], [place] [address]  Fax: [phone]

## 2011-01-14 NOTE — H&P (Signed)
[name], [name]             ACCOUNT NO.:  [id number]   MEDICAL RECORD NO.:  [id number]          PATIENT TYPE:  OBV   LOCATION:  A225                          FACILITY:  [facility]   PHYSICIAN:  Osvaldo Shipper, MD     DATE OF BIRTH:  Apr 23, 1922   DATE OF ADMISSION:  06/20/2005  DATE OF DISCHARGE:  LH                                HISTORY & PHYSICAL   PRIMARY CARE PHYSICIAN:  Milus Mallick. Lodema Hong, M.D.   ADMISSION DIAGNOSES:  1.  ?Syncopal episode with fall.  2.  Dementia.  3.  Hypertension.  4.  Congestive heart failure, slightly decompensated.   CHIEF COMPLAINT:  Fall and pain in both legs.   HISTORY OF PRESENT ILLNESS:  The patient is an 77 year old African-American  female with a questionable past medical history of congestive heart failure  with a recent EF of about 55% done in July of 2006. The patient also has  history of lower extremity edema which is thought to be secondary to CHF,  though this history is not fully clear. She also has a history of high blood  pressure, depression, dementia. Because of her mental problems and her  dementia, her history is not entirely reliable. The patient lives alone at  home. She does have a nursing aid who is there only about three hours a day.   The patient gives history of having a fall on Wednesday. She mentioned that  she passed out and she fell backwards. She does not fully remember what  exactly happened. When she woke up, she was sitting on the floor. She could  not get up. She had to crawl to the door and call for help. As per the  patient ever since that day, the patient has not walked, ambulated, or even  gotten out of her chair. She says that she has been sleeping in her  wheelchair. The patient is very concerned because she lives alone. The  patient did not give any history of lightheadedness, chest pain,  palpitations before or after the episode. She also does not give history of  suggestive of any seizure episodes. The  patient does give history of urinary  incontinence recently but no bowel incontinence.   Currently, the patient is occasionally in tears because of the pain. Apart  from pain in her legs she also gives history of back pain as well. The  patient is unable to fully characterize this pain. She is unable to tell me  if the pain radiates anywhere or not.  She also gives a history of an inner  ear problem.   MEDICATIONS:  1.  Darvocet-N 100 p.r.n.  2.  Os-Cal.  3.  Valium 5 mg t.i.d.  4.  Potassium chloride 40 mEq daily.  5.  Diovan 320 mg daily.  6.  Lotrel 5/10 mg daily.  7.  Citalopram 10 mg daily.  8.  AcipHex daily.  9.  Aricept 10 mg daily.  10. Lamisil.  11. Clarinex.  12. Lasix 40 mg daily.   ALLERGIES:  ASPIRIN unknown reaction.   PAST MEDICAL HISTORY:  1.  Mostly  obtained from previous summaries and it includes rheumatoid      arthritis.  2.  Hypertension.  3.  Congestive heart failure.  4.  History of TIA.  5.  Dyslipidemia.  6.  ?History of MI x2. The patient mentions that she had a MI about two      years ago at [facility], though the e-chart does not suggest      this.  7.  History of anxiety disorder.   PAST SURGICAL HISTORY:  1.  Tonsillectomy.  2.  Appendectomy.   The patient had a recent stress test back in August 2006. The stress test  showed a low risk scan in a patient with no coronary artery disease.  Perfusion was uniformed throughout all myocardial segments. There was no  evidence for ischemia or a scar. EF on this scan was noted to be 66%.   There is a report of a cardiac catheterization from April 2004 which showed  normal coronary angiogram and normal left ventricular function.   SOCIAL HISTORY:  The patient lives mostly by herself. She does have a  nursing aid that visits her once in a while. Before this fall, the patient  was able to ambulate with a cane; however, since the fall, she has not been  able to do so. The patient denies  any smoking history, any alcohol intake or  any illicit drug use.   FAMILY HISTORY:  Noncontributory at this time.   PHYSICAL EXAMINATION:  VITAL SIGNS:  Temperature is normal. Blood pressure  153/82 sitting, heart rate is 62, respiratory rate 18. Orthostatics were  attempted. There was no significant change from lying to sitting; however,  the patient could not stand up because of pain.  GENERAL:  This is a very anxious-appearing elderly woman in mild distress  because of pain.  HEENT:  There is no pallor and no icterus. Moist mucus membranes. No oral  lesions are seen.  LUNGS:  Clear to auscultation bilaterally. No rales, rhonchi, or wheezing  appreciated.  CARDIOVASCULAR:  S1 and S2, regular. No murmurs appreciated.  ABDOMEN:  Soft. There is slight tenderness in the epigastric region. No  organomegaly or mass present. Bowel sounds are present.  EXTREMITIES:  Reveal 2+ edema bilateral lower extremities up to the mid-  thigh. There is tenderness to palpation over her legs just above her ankle  especially on the left side but there is some tenderness bilaterally as  well. There is no restriction in the range of motion of her ankles. Her  range of motion on the knee is somewhat restricted but her joint seems to be  intact. Straight leg raising was negative for any sharp pain. The patient  did have 5/5 strength in her feet. She did have about 4-5/5 strength  proximally in her legs as well.  BACK:  Revealed slight tenderness to palpation in the midline over her  lumbosacral region but no real point of tenderness.  RECTAL: Yellow stool with good rectal tone.   LABORATORY DATA:  Includes CBC which shows white count of 5.7, hemoglobin is  11.3, platelet count 258,000. MCV 90, differential is normal. BNP is  completely unremarkable. LFT's are not available. BNP is less than 30. UA  does not suggest any infection. There is no protein or blood.   IMAGING STUDIES:  CAT scan of the head was  done which showed no evidence for  any acute CVA but did show atrophy and nonspecific white mater changes.  IMPRESSION:  This is an 77 year old African-American female with a past  medical history of hypertension, chronic lower extremity edema, dementia who  presents to the emergency department with complaints of pain in both her  legs after sustaining a fall with a syncopal episode about five days ago.  Differential diagnosis includes trauma to both her legs. Musculoskeletal  pain. Differential for the syncope includes cardiac etiology.  Cerebrovascular accident is unlikely as there are no focal deficits. Cardiac  arrhythmias is also a possibility. Acute coronary syndrome needs also to be  considered. The patient mentioned that she has not been taking her  medication every since the fall, especially the Lasix. The patient lives  alone and her safety is also a big concern at this time.   PLAN:  1.  Syncopal episode:  We will observe the patient in the hospital. Rule her      out for acute coronary syndrome. Carotid Doppler's will be ordered. She      did have a recent echocardiogram. We will have physical therapy evaluate      the patient.   1.  Musculoskeletal:  I will obtain x-rays of both of her legs, knees, as      well as her ankles to rule out any kind of traumatic injury. I will also      get an x-ray of her lumbosacral spine. I do not think at this time that      there is any spinal cord involvement at this time considering the      patient's good strength bilaterally. The reflexes also are equal and not      hyperreflexic. Straight leg raising also does not produce any symptoms.   1.  I will continue her other medications at this time.   Further management and decision will be based on results of initial testing  and the patient's response to treatment.      Osvaldo Shipper, MD  Electronically Signed     GK/MEDQ  D:  06/20/2005  T:  06/20/2005  Job:  [id number]   cc:    Milus Mallick. Lodema Hong, M.D.  Fax: [phone]

## 2011-01-14 NOTE — Procedures (Signed)
[name], NEASE NO.:  [id number]   MEDICAL RECORD NO.:  [id number]          PATIENT TYPE:  REC   LOCATION:  RAD                           FACILITY:  [facility]   PHYSICIAN:  Vida Roller, M.D.   DATE OF BIRTH:  10-30-1921   DATE OF PROCEDURE:  DATE OF DISCHARGE:                                    STRESS TEST   Ms. Bunyan is an 77 year old female with no known coronary disease with  complaints of dyspnea, exertional chest discomfort. She had an  echocardiogram that revealed normal LV function. She had a Holter monitor  that revealed a heart rate from 44 to 90 beats per minute in sinus rhythm  with no significant arrhythmias. Her cardiac risk factors include  hypertension, hyperlipidemia and age.   BASELINE DATA:  Electrocardiogram reveals sinus rhythm at 63 beats per  minute, nonspecific ST abnormalities. Blood pressure is 130/78.   The patient reported palpitations and chest tightness with infusion that  resolved in recovery. Electrocardiogram revealed a transient junctional  tachycardia with infusion with few PVCs noted. No ischemic changes were  noted.   Final images and results are pending M.D. review.      Jae Dire, P.A. [facility]      Vida Roller, M.D.  Electronically Signed    AB/MEDQ  D:  03/30/2005  T:  03/30/2005  Job:  [id number]

## 2011-01-14 NOTE — H&P (Signed)
[name], [name]             ACCOUNT NO.:  [id number]   MEDICAL RECORD NO.:  [id number]          PATIENT TYPE:  INP   LOCATION:  A228                          FACILITY:  [facility]   PHYSICIAN:  Renato Battles, M.D.     DATE OF BIRTH:  [birth year]   DATE OF ADMISSION:  01/03/2006  DATE OF DISCHARGE:  LH                                HISTORY & PHYSICAL   PRIMARY CARE PHYSICIAN:  Milus Mallick. Lodema Hong, M.D.   REASON FOR ADMISSION:  Generalized weakness.   HISTORY OF PRESENT ILLNESS:  The patient is an 77 year old African-American  female who is a very, very poor historian.  I honestly could not figure out  what was the main reason for her presentation, so I will summarize her  complaints through the review of systems.   CONSTITUTIONAL SYMPTOMS:  She denies fever and chills or night sweats.  She  reports lack of appetite and decreased oral intake and moderate decrease in  weight.  GASTROINTESTINAL:  She reports having constipation.  No nausea or  vomiting.  She reports also having dysphagia with solid foods.  She said she  mainly drinks Ensure and whatever soft food she can put her hands on.  She  also reports having just generalized vague abdominal pain that she points in  the sides of her abdomen and lower quadrants.  Then she states that her back  is hurting.  It is very hard to discern where is the pain and what kind of  pain she has.  GENITOURINARY:  She denies any dysuria, hematuria or  retention.  She does report having a lot of polyuria but then she comes back  and states she has been taking medication to get rid of extra fluid she has.  CARDIOPULMONARY:  No cough, no PND or orthopnea.  She does report having  shortness of breath.  She reports having midanterior chest pain with no  radiation, comes at rest, periodic, no relieving factors, and, as stated, no  shortness of breath.  OTHER COMPLAINTS:  She complained of back pain and  lightheadedness.   PAST MEDICAL HISTORY:  1.   GERD.  2.  CHF.  3.  Chronic venous stasis.  4.  Asthma.  5.  Gout.  6.  H. pylori gastritis.  7.  Hypertension.  8.  History of TIA.  9.  Glaucoma.   FAMILY HISTORY:  Noncontributory.   SOCIAL HISTORY:  She lives alone at [place].  There is a nursing aide  that comes to help her on a daily basis.  No tobacco, alcohol or drugs.   ALLERGIES:  ASPIRIN, unknown reactions.   HOME MEDICATIONS:  1.  Clarinex 5 mg p.o. daily.  2.  Zetia 10 mg p.o. q.h.s.  3.  Diazepam 5 mg p.o. t.i.d.  4.  Darvocet 100 one tablet p.o. t.i.d.  5.  Lotrel 5/20 mg daily.  6.  Potassium 20 mEq p.o. daily.  7.  Lescol XL 80 mg p.o. q.h.s.  8.  Oyster Cal-D 500 mg t.i.d.  9.  Citalopram 10 mg p.o. daily.  10. Aricept 10 mg p.o.  daily.  11. Lasix 40 mg q.a.m. and 20 mg q.p.m.  12. Rhinocort.   PHYSICAL EXAMINATION:  GENERAL:  The patient is alert and oriented x3.  VITAL SIGNS:  Temperature 98, heart rate 63, respiratory rate 20, blood  pressure 153/75, O2 saturation 100% on room air.  HEENT:  Head atraumatic, normocephalic.  Pupils equal, round, and reactive  to light.  The patient's extraocular movements intact bilaterally.  NECK:  No lymphadenopathy, no thyromegaly, no JVD.  CHEST:  Clear to auscultation bilaterally.  No wheezing, rales or rhonchi.  CARDIAC:  Regular rate and rhythm, no murmurs.  ABDOMEN:  Soft, diffusely tender with more superficial tenderness than deep  palpation.  Decreased bowel sounds.  EXTREMITIES:  No cyanosis or clubbing, 1+ lower extremity edema.  NEUROLOGIC:  Grossly intact, although the patient reported when nurses stood  her up to walk she could not move her legs at all.   STUDIES:  CBC was unremarkable.  Electrolytes showed elevated BUN at 51,  elevated creatinine at 1.8, otherwise unremarkable.  BNP less than 30.  Urinalysis negative.  Liver functions unremarkable.  Abdominal and pelvic CT  scan negative.  ESR mildly elevated at 33.  Chest x-ray showed some   congestion and left lower lobe atelectasis.   ASSESSMENT AND PLAN:  1.  Dehydration.  The patient will receive some IV fluids and very gently.  2.  Acute renal failure.  Again, this most likely is prerenal.  Will treat      with gentle hydration.  3.  Congestive heart failure.  Currently the patient is dehydrated and we      are going to give her some fluids.  Monitor daily ins and outs and daily      weights.  4.  Weakness.  I honestly cannot find the reason for her generalized      weakness except for, perhaps, being dehydrated.  Head MRI/MRA will be      ordered to rule out any ischemic events or multiple sclerosis.  5.  Chest pain.  Very atypical; however, in this 77 year old with multiple      risk factors, I am going to rule out myocardial infarction, start the      patient on Plavix and admit to telemetry.  6.  Dysphagia.  Check swallow evaluation.  Continue with soft mechanical      diet for the time being.  7.  Disposition:  I really doubt that this patient will be able to go      anywhere but a skilled nursing facility and will consult case management      for placement.      Renato Battles, M.D.  Electronically Signed     SA/MEDQ  D:  01/03/2006  T:  01/03/2006  Job:  [id number]   cc:   Milus Mallick. Lodema Hong, M.D.  Fax: [phone]

## 2011-01-14 NOTE — H&P (Signed)
[name], MORITZ             ACCOUNT NO.:  [id number]   MEDICAL RECORD NO.:  [id number]          PATIENT TYPE:  INP   LOCATION:  A309                          FACILITY:  [facility]   PHYSICIAN:  Lonia Blood, M.D.      DATE OF BIRTH:  01-07-1922   DATE OF ADMISSION:  07/11/2005  DATE OF DISCHARGE:  LH                                HISTORY & PHYSICAL   PRIMARY CARE PHYSICIAN:  Milus Mallick. Lodema Hong, M.D.   PRESENTING COMPLAINT:  Past history of lower extremity edema.   HISTORY OF PRESENT ILLNESS:  The patient is an 78 year old with baseline  history of CHF and probable diastolic dysfunction based on recent 2-D  echocardiogram in July 2006, who is here secondary to past history of lower  extremity edema.  The patient had a fall and was admitted here at the end of  October and was noted to have that lower extremity edema as well which was  presumed to be chronic.  She was sent to her primary care physician after  discharge who noted that her edema is not getting any better and decided to  send her here.  The patient denied any pain but has had some shortness of  breath with the edema.  She has been unable to bend her toes.  No fever.  She noted the edema is getting worse and worse on a daily basis.  When she  was discharged at the end of October, the dose of Lasix was decreased  secondary to increasing creatinine level.  Her recent 2-D echocardiogram was  on March 02, 2005, and showed normal LV function.  There was mild concentric  left ventricular hypertrophy present as well as tricuspid regurgitation.   RECENT MEDICATIONS:  1.  Valium 5 mg p.o. 3 times a day.  2.  Lasix 20 mg daily.  3.  Os-Cal 500 Plus D 1 tablet 3 times a day.  4.  Darvocet-N 100 q.4 h. p.r.n.  5.  KCl 40 mEq daily.  6.  Diovan 325 mg daily.  7.  Lotrel 5/10 mg daily.  8.  Citalopram 10 mg daily.  9.  Aciphex 30 mg daily.  10. Aricept 10 mg daily.  11. Lamisil p.r.n.  12. Clarinex.  13. Lasix as indicated, 20  mg daily.   ALLERGIES:  The patient is allergic to ASPIRIN but not sure what it causes.   PAST MEDICAL HISTORY:  1.  CHF.  2.  Hypertension.  3.  Dementia.  4.  Morbid obesity.  5.  History of TIA.  6.  Anxiety and depression disorder.  7.  Dyslipidemia.  8.  Status post tonsillectomy.  9.  Status post appendectomy.   SOCIAL HISTORY:  She lives alone.  There is a nursing aide who comes to the  house from time to time. She was able to ambulate with a cane.  Denies any  smoking history.  No alcohol intake or illicit drug use.   FAMILY HISTORY:  Noncontributory.   REVIEW OF SYSTEMS:  A 10-point Review of Systems is essentially as in HPI  except for pain  on and off,  mainly in the legs.   PHYSICAL EXAMINATION:  VITAL SIGNS: Temperature 96.8, blood pressure 127/68,  pulse 59, respiratory rate 22, O2 saturation 99% on room air.  GENERAL:  Alert and oriented, morbidly obese woman in no acute distress.  HEENT:  PERRLA.  EOMI.  NECK:  Supple.  No JVD, no lymphadenopathy.  RESPIRATORY: Good air entry bilaterally.  No wheezes or rhonchi.  CARDIOVASCULAR:  The patient has regular rate and rhythm.  ABDOMEN:  Soft, obese, nontender.  Positive bowel sounds.  EXTREMITIES:  Show 4+ pedal edema up to the thigh, mostly pitting.   LABORATORY DATA:  White count 6, hemoglobin 11.8, platelet count 236 with  normal differential.  Sodium 137, potassium 4.1, chloride 100, CO2 30,  glucose 81, BUN 23, creatinine 1.4, calcium 9.3.   ASSESSMENT:  This is an 77 year old female presenting with persistent lower  extremity edema.  From my impression, this may be chronic venous stasis.  The patient may be better off using some form of stocking and keeping her  feet elevated.  However, due to the concern by the family care physician,  who has tried outpatient measures, she wants [place] to try some measures in the  hospital to at least see what they are.  Also rule out other medical causes  which may include  congestive heart failure, especially since she has  diastolic dysfunction.  We will also rule out deep vein thrombosis.  Other  possibilities are elephantitis which is less likely in a patient who is not  [place].   PLAN:  1.  Persistent lower extremity edema.  We will admit the patient mainly for      basic workup as indicated above.  I will increase her dose of Lasix      while monitoring her renal function.  We will put a Foley in place and      elevate her feet.  We will start with some TED hose on the lower      extremities and graduate to more pressure yielding hose to keep pressure      on her feet.  We will elevate the feet while in the hospital.  I will      continue with her home medications.  2.  History of congestive heart failure.  The patient just recently had a 2-      D echocardiogram in July that showed normal left ventricular function.      Her lungs also were clear, so I will not repeat the 2-D echocardiogram.      I will, however, check a BNP.  3.  Hypertension.  Her blood pressure seems to be well controlled; however,      due to creeping creatinine, I will temporarily hold the Diovan.  4.  Anxiety and depression.  Will continue with her citalopram from home.  5.  Gastroesophageal reflux disease.  I will continue with PPI.  Instead of      Aciphex, we will start her on Protonix while in the hospital.  6.  Osteoarthritis.  Will continue with Os-Cal Plus D and the Darvocet      p.r.n.      Lonia Blood, M.D.  Electronically Signed     LG/MEDQ  D:  07/11/2005  T:  07/11/2005  Job:  [id number]   cc:   Milus Mallick. Lodema Hong, M.D.  Fax: [phone]

## 2011-01-14 NOTE — Discharge Summary (Signed)
   [name], [name]                         ACCOUNT NO.:  [id number]   MEDICAL RECORD NO.:  [id number]                   PATIENT TYPE:   LOCATION:                                       FACILITY:   PHYSICIAN:  Hanley Hays. Dechurch, M.D.           DATE OF BIRTH:  February 27, 1922   DATE OF ADMISSION:  12/21/2002  DATE OF DISCHARGE:  12/24/2002                                 DISCHARGE SUMMARY   DIAGNOSES:  1. Chest pain with negative enzymes and no EKG changes.  2. Coronary artery disease by history followed by [facility].  3. Hypertension.  4. Gastritis.  5. Colon polyps.  6. Diverticulosis.  7. Renal insufficiency.  8. History of cerebrovascular accident, though MRI revealed no deficits.  9. History of appendectomy.  10.      Failure-to-thrive with limited support system at home.   DISPOSITION:  The patient transferred to [facility] for cardiac  catheterization.   HOSPITAL COURSE:  Briefly, a 77 year old African-American female followed by  Dr. Lodema Hong with a history of coronary artery disease, apparently with a  history of cardiac arrest many years ago, who had been in her usual  debilitated state and presented to the emergency room with chest pain which  apparently had some relief with nitroglycerin.  The patient continued to  have ongoing chest pain in the hospital.  She was seen in consultation by  cardiology.  It was felt that her ongoing symptoms would warrant  catheterization.  She was discharged in stable condition to [facility] for further disposition.  Please see the discharge summary from  that facility.                                               Hanley Hays Josefine Class, M.D.    FED/MEDQ  D:  02/08/2003  T:  02/08/2003  Job:  [id number]

## 2011-01-14 NOTE — Procedures (Signed)
NAMEDENELDA, AKERLEY NO.:  [id number]   MEDICAL RECORD NO.:  [id number]          PATIENT TYPE:  OUT   LOCATION:  RAD                           FACILITY:  [facility]   PHYSICIAN:  Olga Millers, M.D. LHCDATE OF BIRTH:  1922-06-06   DATE OF PROCEDURE:  03/02/2005  DATE OF DISCHARGE:                                  ECHOCARDIOGRAM   The patient is an 77 year old female with a past medical history of  hypertension.  This study is performed to quantify left ventricular function  and rule out left ventricular hypertrophy.  It is technically adequate.  The  two dimensional measures are as follows:  The left ventricular end-diastolic  dimension is 3.2 cm and the left ventricular and systolic dimension is 2.2  cm.  The posterior wall was 1.1 cm and the septum is 1.4 cm.  The left  atrium is 3.9 cm and the aorta is 2.9 cm.   The overall left ventricular function is normal with an estimated ejection  fraction of 55%.  There are no focal wall motion abnormalities noted.  There  is mild concentric left ventricular hypertrophy, as well as mild proximal  septal thickening.  There is mild left atrial enlargement.  The right atrium  and right ventricle are not well visualized, but appear to be normal.  There  is no pericardial effusion.   The aortic valve is normal and mildly sclerotic.  There is no aortic  stenosis or aortic insufficiency.  There was mild mitral annular  calcification with trace mitral regurgitation.  There was trace tricuspid  regurgitation.  There was no pulmonic insufficiency.   FINAL INTERPRETATION:  1.  Normal left ventricular function.  2.  Mild concentric left ventricular hypertrophy.  3.  Trace mitral and tricuspid regurgitation.       BC/MEDQ  D:  03/02/2005  T:  03/02/2005  Job:  [id number]   cc:   Milus Mallick. Lodema Hong, M.D.  [address]  [place], [place] [address]  Fax: [phone]

## 2011-01-14 NOTE — Consult Note (Signed)
NAME:  [name] [name]                  ACCOUNT NO.:   MEDICAL RECORD NO.:                     PATIENT TYPE:   LOCATION:                                 FACILITY:   PHYSICIAN:  Lionel December, M.D.         DATE OF BIRTH:   DATE OF CONSULTATION:  1922-02-14  DATE OF DISCHARGE:                                 CONSULTATION    REASON FOR CONSULTATION:  Dysphagia.   HISTORY OF PRESENT ILLNESS:  Ms. [name] [name] is an 77 year old female  referral from Dr. Anthony Sar office.  Ms. [name] [name] is complaining of  dysphagia for the last few years.  She is unable to chew her food.  She  is edentulous.  She feels that foods are lodging in her esophagus.  She  did undergo an EGD/ED years ago by Dr. Katrinka Blazing for the same reason.  Her  appetite has remained good and there has been no weight loss.  Her  daughter grinds her food up, so foods will not lodge.  She has a bowel  movement about 3 times a week.  Sometimes she will not go a week and  then will have to take lactulose on a p.r.n. basis.  Her acid reflux is  controlled with omeprazole.   ALLERGIES:  She is allergic to ASPIRIN.   SURGICAL HISTORY:  She has had an appendectomy and a tonsillectomy.   MEDICAL HISTORY:  CHF, hypertension, gout, ulcer to her left heel, high  cholesterol, and acid reflux.   FAMILY HISTORY:  Mother is deceased from diabetes type 2 at age 7.  Her  father deceased from diabetes also.  She has 3 sisters that are deceased  from diabetes.  She has 2 brothers, 1 is alive with diabetes and  Alzheimer's, 1 is deceased (from blocked valve).  She is divorced.  She  is retired from housekeeping.  She does not smoke, drink, or do drugs.  She has one child in good health.   OBJECTIVE:  VITAL SIGNS/GENERAL:  Please note the patient was examined  from the chair.  She is unable to stand, her weight per daughter is 150  pounds:  Her blood pressure is 98/70, her pulse is 60.  Her height is 5  feet 4 inches.  EYES:  Her conjunctivae are  pink.  Her sclerae are anicteric.  Oral  mucosa is moist.  NECK:  Her thyroid is normal.  There is no cervical lymphadenopathy.  LUNGS:  Clear.  ABDOMEN:  Soft.  Bowel sounds are present.  Her abdomen is obese.  There  is no masses felt.  No tenderness.  EXTREMITIES:  There is 2+ edema to her extremities.   ASSESSMENT:  Ms. [name] [name] is 77 year old female with complaints of  dysphagia for the last couple of years.  All foods would lodge if they  are not ground.  Also part of her problems is that she does not have  teeth and has not worn them for many years.  She states her dentures  just do not fit.  RECOMMENDATIONS:  We will schedule an EGD/ED with Dr. Karilyn [name] in the near  future.  The risk and benefits were reviewed with the patient, and the  daughter and they understand the risk and benefits.  Go back to home  medications.  She is on Aricept 10 mg daily, Plavix 75 mg daily (there  is no history of a pulmonary emboli, atrophy, or stent placement. No  history.  She is on Zetia 10 mg a day, calcium with D one a day,  nitroglycerin sublingual as needed, omeprazole 20 mg twice a day,  diazepam 5 mg 3 times a day, citalopram 20 mg a day, lovastatin 40 mg at  night, lactulose 1 tablespoon q.4 days as needed for constipation,  tramadol 50 mg 3 times a day as needed, Lasix 40 mg a day, Silvadene 1%  cream as needed, DuoDERM to ulcer to buttocks, colchicine 0.6 mg daily,  potassium chloride 30 mL a day, benazepril/hydrochlorothiazide 20/25 one  a day, and she is on oxygen 2 liters at night.     ______________________________  Dorene Ar, NP    ______________________________  Lionel December, M.D.     TS/MEDQ  D:  08/03/2010  T:  08/04/2010  Job:  [id number]   cc:   Dr. Lodema Hong

## 2011-01-14 NOTE — Discharge Summary (Signed)
[name], [name]             ACCOUNT NO.:  [id number]   MEDICAL RECORD NO.:  [id number]          PATIENT TYPE:  INP   LOCATION:  A228                          FACILITY:  [facility]   PHYSICIAN:  Mobolaji B. Bakare, M.D.DATE OF BIRTH:  [birth year]   DATE OF ADMISSION:  01/03/2006  DATE OF DISCHARGE:  01/06/2006                                 DISCHARGE SUMMARY   PRIMARY CARE PHYSICIAN:  Milus Mallick. Lodema Hong, M.D.   FINAL DIAGNOSES:  1.  Acute renal failure.  2.  Weakness and deconditioned secondary to #1.  3.  Sinus bradycardia.  4.  Congestive heart failure.  5.  Sigmoid diverticulosis.   PROCEDURES:  1.  A 2-D echocardiogram  done on Jan 05, 2006, showed no abnormality and      was no significant change from echocardiogram , estimated ejection      fraction was 60 to 65%.  2.  Chest x-ray done on Jan 03, 2006, showed cardiomegaly with minimal      vascular congestion, subsegmental lower lobe atelectasis.  3.  CT scan of abdomen and pelvis:  No acute upper abdominal abnormalities,      sigmoid diverticulosis without sigmoid diverticulitis.  4.  MRI brain showed no acute infarct.  There is atrophy with moderate small      vessel disease type changes.  5.  MRA head showed no significant stenosis.  There is approximately 50%      stenosis of the right mid cerebral artery distal to the region of the      anterior temporal branch which is small.  6.  Chest x-ray done on Jan 05, 2006, showed no acute abnormalities.   BRIEF HISTORY:  Please refer to the admission H&P.  In brief, [name] is  a pleasant 77 year old African American female who presented with chief  complaint of generalized weakness.  There was no vascular system that was  attributable to this weakness.  She was noted to have had decreased appetite  and poor p.o. intake, constipation, dysphagia, but no weight loss, back pain  and generalized abdominal pain.  She was also noted to have been dehydrated.  Vitals on  initial admission were temperature 98, heart rate 63, blood  pressure 153/75, O2 saturation 100% on room air.  Lungs and cardiac  examination were unremarkable.  She had diffuse tenderness on deep palpation  in the abdomen without rebound or guarding.   Initial laboratory data showed BUN 51, creatinine 1.8.  BNP was 30.   Initial radiology imaging as noted above.   She was admitted for hydration and further evaluation.   HOSPITAL COURSE:  GENERALIZED WEAKNESS:  This is felt to be secondary to  dehydration and acute renal failure with slightly elevated bilirubin.  She  was started on gentle hydration given her history of CHF.  She indeed  developed mild vascular congestion during the course of hospitalization  secondary to IV fluid. This was discontinued.  She received one dose of IV  Lasix.  Patient responded to this and breathing improved. A 2-D  echocardiogram  showed normal left ventricular function with good  ejection  fraction.  She probably has diastolic dysfunction.  No significant valvular  abnormality.  TSH was 0.754.   She had complained of dysphagia.  Patient was further seen by the speech  therapist for swallowing evaluation.  It was noted that she had reduced oral  intake.  On swallowing evaluation, had soft clinical signs and symptoms of  dysphagia at times with puree.  She otherwise tolerated consistency,  presented without signs and symptoms of distress.  Dysphagia II diet with  thins was recommended with aspiration precautions.   DECONDITIONED:  She was seen by physical therapist.  She as able to perform  independent activity and was independent in all bed mobility.  She only  required minimal assist to stand from sitting position.  She was set up to  have home health PT and OT.  Family was agreeable to having her go back  home.   SINUS BRADYCARDIA:  She was noted to have sinus bradycardia on telemetry.  She was asymptomatic.  This was secondary to Lopressor which  was initiated  secondary to ruling out myocardial infarction.  This was discontinued and  heart rate was in the range of 60 to 65 prior to discharge.   ATYPICAL CHEST PAIN:  She had complained of chest pain on admission. There  was no shortness of breath or orthopnea.  Pain was atypical and felt to be  related to gastroesophageal reflux disease.  She ruled out for myocardial  infarction with negative sets of enzymes.  EKG was normal.  A 2-D  echocardiogram  showed no wall motion abnormalities.   DISCHARGE MEDICATIONS:  1.  Clarinex 5 mg p.o. daily.  2.  Zetia 10 mg p.o. nightly.  3.  Diazepam 5 mg p.o. b.i.d.  4.  Darvocet 100 mg p.o. t.i.d.  5.  Lotrel 5/20 p.o. daily.  6.  Potassium 20 mEq daily.  7.  Lescol SL 80 mg p.o. nightly.  8.  Oyster calcium 500 mg p.o. t.i.d.  9.  Citalopram 10 mg p.o. daily.  10. Aricept 10 mg p.o. daily.  11. Lasix 40 mg p.o. q.a.m. and q.p.m.  12. Lanacort as before.   DISCHARGE LABORATORY DATA:  Sodium 140, potassium 3.7, chloride 108, CO2 27,  glucose 96, BUN 23, creatinine 1.4, calcium 8.4.  Hemoglobin A1c 5.6.  TSH  0.754.  Magnesium 2.5, phosphorus 3.5.  Sed rate 33.  White count 5.9,  hemoglobin 11.9, hematocrit 6.3, platelets 243.      Mobolaji B. Corky Downs, M.D.  Electronically Signed     MBB/MEDQ  D:  02/14/2006  T:  02/14/2006  Job:  [id number]   cc:   Milus Mallick. Lodema Hong, M.D.  Fax: [phone]

## 2011-01-14 NOTE — H&P (Signed)
NAME:  RICK, WARNICK                       ACCOUNT NO.:  [id number]   MEDICAL RECORD NO.:  [id number]                   PATIENT TYPE:  INP   LOCATION:  A218                                 FACILITY:  [facility]   PHYSICIAN:  Melvyn Novas, MD        DATE OF BIRTH:  24-Apr-1922   DATE OF ADMISSION:  02/24/2004  DATE OF DISCHARGE:                                HISTORY & PHYSICAL   HISTORY OF PRESENT ILLNESS:  The patient is an 77 year old black female  patient of Dr. Syliva Overman who lives alone and has 2 daily sitters, who  has been complaining of increasing anxiety and tachypnea today and  tremulousness although the patient specifically denies any increasing  dyspnea. She was noted to have 2 to 3+ pedal edema and ruminating concerning  taking her nerve pill, which she had missed since 3:00 o'clock this  afternoon. She appears somewhat agitated when questioned by me and calms  down when not verbally being asked questions by myself. She denies any  anginal pain. She denies any nausea, vomiting, cough, sputum or hemoptysis.  She is admitted for control of progressive volume overload, possible  ventricular failure and chronic pain.   PAST MEDICAL HISTORY:  Significant for rheumatoid arthritis, hypertension,  congestive heart failure, transischemic attack, hyperlipidemia, chronic  pain, status post myocardial infarction x2 with ejection fraction of 50%,  colonic polyps as well as anxiety.   PAST SURGICAL HISTORY:  Remarkable for appendectomy and tonsillectomy.   SOCIAL HISTORY:  She does not drink. She does not smoke. She lives alone  with sitters.   CURRENT MEDICATIONS:  Darvocet 1 t.i.d., Valium 5 t.i.d., K-Dur 20 meq q.d.,  Diovan 320 mg per day, Lotrel 5/10 mg p.o. q.d.   PHYSICAL EXAMINATION:  VITAL SIGNS:  Temperature 98.8, pulse 80 and regular,  respiratory rate 20. O2 saturation is 98%. Blood pressure is 125/71.  HEENT:  Eyes, pupils are equal, round, and  reactive to light and  accommodation. Extraocular muscles intact. Sclera clear. Conjunctiva pink.  LUNGS:  Diminished breath sounds at the bases. No rales, wheezes, rhonchi  appreciable.  NECK:  No jugular venous distention. No carotid bruits. No thyromegaly or  thyroid bruits.  ABDOMEN:  Protuberant, soft. Bowel sounds are normal active. No guarding,  rebound, masses, or organomegaly.  EXTREMITIES:  2 to 3+ pedal edema. No clubbing or cyanosis.  NEUROLOGIC:  Cranial nerves 2-12 are grossly intact. The patient feels very  anxious when interacting with doctor. Sensory and motor 5 out of 5.  Plantar's are downgoing. The patient has chronic knee and hip pain.   IMPRESSION:  1. Volume overload, progressive, with an ejection fraction of 50%.  2. Anxiety on Valium 5 mg t.i.d.  3. Diminished activity and bedridden at home.  4. Hypertension.  5. Hyperlipidemia.  6. Status post myocardial infarction.  7. Rheumatoid arthritis.   PLAN:  Admit. Fluid restricted. A 2-D echocardiogram to assist right and  left ventricular  function. Control anxiety and possible Valium habituation.  The patient states the desire to go home, therefore, NO CODE and no CPR.  Lasix 40 mg intravenous b.i.d. Serial basic metabolic profile and magnesium  level.     ___________________________________________                                         Melvyn Novas, MD   RMD/MEDQ  D:  02/24/2004  T:  02/24/2004  Job:  [id number]

## 2011-01-14 NOTE — Procedures (Signed)
[name], SCALICI             ACCOUNT NO.:  [id number]   MEDICAL RECORD NO.:  [id number]          PATIENT TYPE:  INP   LOCATION:  A228                          FACILITY:  [facility]   PHYSICIAN:  Vida Roller, M.D.   DATE OF BIRTH:  04-21-1922   DATE OF PROCEDURE:  01/05/2006  DATE OF DISCHARGE:                                  ECHOCARDIOGRAM   REFERRING PHYSICIAN:  Mobolaji B. Corky Downs, M.D.   TAPE NUMBER:  [id number].  Tape count [id number].   INDICATIONS:  This is an 77 year old woman with chest pain and shortness of  breath.  Previous echocardiogram from February of this year shows preserved  LV systolic function, mild LVH and no significant valvular heart disease.  Today's study is technically difficult.   M-MODE TRACINGS:  The aorta is 28 mm.   The left atrium is 42 mm.   The septum is 15 mm.   The posterior wall is 14 mm.   Left ventricular diastolic dimension is 34 mm.  Left ventricular systolic  dimension 24 mm.   Two-dimensional echocardiogram and Doppler imaging:  The left ventricle is  normal size.  There is preserved LV systolic function.  Estimated ejection  fraction 60-65%.  There is moderate concentric left ventricular hypertrophy,  no wall motion abnormalities are seen.   The right ventricle is enlarged with preserved RV systolic function.   There is biatrial enlargement.   The aortic valve is sclerotic with trivial insufficiency.  No stenosis is  seen.   The mitral valve has mild regurgitation.   The tricuspid valve has mild regurgitation.   There is no pericardial effusion.   ASSESSMENT:  No significant change from previous echocardiogram.      Vida Roller, M.D.  Electronically Signed     JH/MEDQ  D:  01/05/2006  T:  01/06/2006  Job:  [id number]

## 2011-01-14 NOTE — H&P (Signed)
[name], [name]             ACCOUNT NO.:  [id number]   MEDICAL RECORD NO.:  [id number]          PATIENT TYPE:  AMB   LOCATION:  DAY                           FACILITY:  [facility]   PHYSICIAN:  Jerolyn Shin C. Katrinka Blazing, M.D.   DATE OF BIRTH:  26-Aug-1922   DATE OF ADMISSION:  DATE OF DISCHARGE:  LH                                HISTORY & PHYSICAL   An 77 year old female with a history of gastroesophageal reflux disease.  The patient is having difficulty with increasing dysphagia.  She has  dysphagia with food and with her medications.  She is symptomatic each time  she eats.  She has had increasing episodes of heartburn.  The patient is  scheduled for upper endoscopy and possible dilatation.   PAST HISTORY:  1.  She has hypertension.  2.  Chronic renal insufficiency.  3.  Congestive heart failure.  4.  Severe degenerative joint disease.  5.  Mild dementia and depression.   MEDICATIONS:  1.  Lexapro 10 mg, one half tablet daily.  2.  Aricept 5 mg daily.  3.  Clarinex 5 mg daily.  4.  Potassium 20 mEq two tablets daily.  5.  Calcium with vitamin D 500 mg three times daily.  6.  Zetia 10 mg daily.  7.  Lescol XL 80 mg daily.  8.  Diazepam 5 mg three times daily.  9.  Lasix 20 mg two daily.  10. Lotrel 5/20 one daily.  11. Protonix 40 mg daily.  12. Darvocet-N 100 one four times daily.   Dictation ended at this point.      Dirk Dress. Katrinka Blazing, M.D.  Electronically Signed     LCS/MEDQ  D:  10/05/2005  T:  10/05/2005  Job:  [id number]

## 2011-01-14 NOTE — Group Therapy Note (Signed)
   NAMEKENEDIE, DIROCCO                         ACCOUNT NO.:  [id number]   MEDICAL RECORD NO.:  [id number]                   PATIENT TYPE:  INP   LOCATION:  A210                                 FACILITY:  [facility]   PHYSICIAN:  Edward L. Juanetta Gosling, M.D.             DATE OF BIRTH:  01-12-1922   DATE OF PROCEDURE:  DATE OF DISCHARGE:                                   PROGRESS NOTE   The rhythm is sinus rhythm with bradycardia grade 50.  There is atrial  enlargement.  Left axis deviation.  Abnormal electrocardiogram.                                               Oneal Deputy. Juanetta Gosling, M.D.    ELH/MEDQ  D:  12/23/2002  T:  12/23/2002  Job:  [id number]

## 2011-01-14 NOTE — Procedures (Signed)
NAMERAYME, BUI             ACCOUNT NO.:  [id number]   MEDICAL RECORD NO.:  [id number]          PATIENT TYPE:  INP   LOCATION:  A217                          FACILITY:  [facility]   PHYSICIAN:  Edward L. Juanetta Gosling, M.D.DATE OF BIRTH:  10/03/1921   DATE OF PROCEDURE:  10/22/2005  DATE OF DISCHARGE:                                EKG INTERPRETATION   The computer read this as sinus rhythm, but there are areas that look like  atrial flutter/fibrillation, and I do not fear that I can definitely rule  out atrial flutter/fibrillation.  So, clinical correlation is suggested.  There is left axis deviation.  There is nonspecific ST-T wave changes.      Edward L. Juanetta Gosling, M.D.  Electronically Signed     ELH/MEDQ  D:  10/24/2005  T:  10/25/2005  Job:  [id number]

## 2011-01-14 NOTE — Consult Note (Signed)
NAMEANALISA, SLEDD NO.:  [id number]   MEDICAL RECORD NO.:  [id number]                   PATIENT TYPE:  INP   LOCATION:  A210                                 FACILITY:  [facility]   PHYSICIAN:  Vida Roller, M.D.                DATE OF BIRTH:  05-Jun-1922   DATE OF CONSULTATION:  12/23/2002  DATE OF DISCHARGE:                                   CONSULTATION   CARDIOLOGY CONSULTATION:   PRIMARY CARE [name] Krupka:  Milus Mallick. Lodema Hong, M.D.   PRIMARY CARDIOLOGIST:  [place] Bing, M.D.   HISTORY OF PRESENT ILLNESS:  [name] is a 77 year old African-American  woman with a history of myocardial infarction x2 one of which was  complicated by cardiac sudden death a number of years ago in the 59s while  in [place], records are not available of this.  She states that she began  having discomfort in her chest which is right-sided, associated with  weakness, and an inability to walk.  There are symptoms associated with  congestive heart failure including bilateral lower extremity edema,  palpitations, shortness of breath; however, over the course of the week  these improved.  She complained of significant severe chest pain Friday  afternoon and described it as sharp, intermittent and presented to the  emergency department and was admitted to the hospital.  [name] is an  extremely difficult historian displaying considerable hyperreligiosity  attributing many of her symptoms to [place] intervention and very hard to pin  down as to specifics regarding her history.  Her past medical history is  complicated and poorly documented, but she appears to have had 2 myocardial  infarctions; however, had a heart catheterization in 1988 in which she has  normal coronaries and normal left ventricular function.  Subsequently she  was evaluated for an episode of chest pain and had an echocardiogram in  January 2002 which revealed inferior/posterior hypokinesis and an  ejection  fraction of about 50%.  She has a history of hypertension, hyperlipidemia,  mild renal insufficiency, diverticulosis, and colonic polyps as well as  gastritis.   SOCIAL HISTORY:  She lives in [place], alone.  She has nursing support at  home.  She has refused placement on numerous occasions.  She is divorced.  She has a daughter who live in [place]; and is quite involved in her  heath care, but does not live in the area.   FAMILY HISTORY:  Her father died in his 54s of coronary disease.  Her mother  died in her 46s of diabetes.  She had 3 siblings who died of complications  of diabetes.  One brother is alive at 73, who has diabetes.  There are 12  total siblings, but those are the only 4 that she knows of the medical  history.   MEDICATIONS ON ADMISSION:  1. Aricept 5 mg a day  2. Lotrel 5 and  10 once a day.  3. Potassium chloride 20 mEq a day.  4. Aspirin 81 mg a day.  5. Diovan 320 mg a day.  6. Clarinex 5 mg a day.  7. Xalatan 0.005% to the right eye.  8. OS-Cal 500 mg a day.  9. Darvocet-N 100 if she needs them.  10.      She takes Zetia 10 mg a day.  11.      Valium 5 mg t.i.d. p.r.n.  12.      Lescol XL 80 mg a day.  13.      Lasix 20 mg a day.   MEDICATIONS IN THE HOSPITAL:  1. She is on Norvasc 5 mg a day.  2. Aspirin 81 mg a day.  3. Lotensin 10 mg a day.  4. Colace 100 mg twice a day.  5. Aricept 5 mg q.h.s.  6. Avapro 300 mg a day.  7. Protonix 40 mg a day.   REVIEW OF SYSTEMS:  Her review of systems is detailed, difficult, and  tangential, but appears to not contribute to the history of present illness.   PHYSICAL EXAMINATION:  GENERAL:  On physical exam she is a well-developed,  well-nourished, African-American woman in no apparent distress.  MENTAL STATUS:  Alert and oriented x4.  VITAL SIGNS:  Her temperature is 98.2, pulse is 64, respirations are 20.  Her blood pressure is 109/44.  She is 156 pounds.  HEENT:  Examination is  unremarkable.  NECK:  Her neck is supple.  There is no jugular venous distention.  There  are no bruits noted.  CHEST:  Hear chest is clear to auscultation.  CARDIAC:  Her heart has a nondisplaced point of maximal impulse with no  lifts or thrills.  First and second heart sounds are normal.  There is no  third or fourth heart sound.  There is a 2/6 diamond-shaped systolic murmur  that radiates towards her carotids.  There is no diastolic murmur noted.  ABDOMEN:  Her abdomen is soft, nontender, normoactive bowel sounds.  GENITOURINARY AND RECTAL:  Exams were deferred.  EXTREMITIES:  Her lower extremities are without clubbing, cyanosis, or  edema.  NEUROLOGIC:  Her neurologic exam is nonfocal.   X-RAY AND LABORATORY DATA:  Her chest x-ray shows no acute disease.  Electrocardiogram reveals sinus rhythm at a rate of 62 with a left axis,  mildly prolonged PR interval at 200 milliseconds, mildly prolonged QRS  duration at 100 milliseconds.  Normal PT duration.  She has inverted T waves  across her anterior precordium with no old EKGs to compare.  Laboratories:  White blood cell count is 5.8 with an H&H of 12 and 36; platelet count of  241.  Sodium 139, potassium 3.7, chloride 103, CO2 29, BUN 20 and creatinine  1.1, her glucose is 88.  Three sets of cardiac enzymes are inconsistent with  acute myocardial infarction.  Her TSH is normal.  D-dimer is normal.  The  urinalysis is unremarkable and the PT and PTT are 30 and 13 with an INR of  1.0.   ASSESSMENT/PLAN:  This is a woman with known coronary disease which has not  been adequately assessed with multiple episodes of chest pain and EKG  changes which are concerning for ischemia. I recommended to her that she  consider a left heart catheterization.  She is uncertain that she wants to  pursue that. She would like to speak with her daughter and pray on the issue and  she will move forward tomorrow morning to make a decision one way or the   other.  Hypertension is reasonably well controlled on the current  medications.   At this point, I am going to defer on the need for anticoagulation therapy.  Will move with aspirin and beta blockers at this point.                                               Vida Roller, M.D.    JH/MEDQ  D:  12/23/2002  T:  12/23/2002  Job:  [id number]

## 2011-01-19 DIAGNOSIS — M109 Gout, unspecified: Secondary | ICD-10-CM

## 2011-01-19 DIAGNOSIS — I509 Heart failure, unspecified: Secondary | ICD-10-CM

## 2011-01-19 DIAGNOSIS — I129 Hypertensive chronic kidney disease with stage 1 through stage 4 chronic kidney disease, or unspecified chronic kidney disease: Secondary | ICD-10-CM

## 2011-01-19 DIAGNOSIS — N189 Chronic kidney disease, unspecified: Secondary | ICD-10-CM

## 2011-01-24 ENCOUNTER — Inpatient Hospital Stay (HOSPITAL_COMMUNITY): Payer: Medicare Other

## 2011-01-24 ENCOUNTER — Inpatient Hospital Stay (HOSPITAL_COMMUNITY)
Admission: EM | Admit: 2011-01-24 | Discharge: 2011-01-27 | DRG: 312 | Disposition: A | Discharge status: Skilled Nursing Facility | Payer: Medicare Other | Attending: Internal Medicine | Admitting: Internal Medicine

## 2011-01-24 ENCOUNTER — Emergency Department (HOSPITAL_COMMUNITY): Payer: Medicare Other

## 2011-01-24 DIAGNOSIS — M109 Gout, unspecified: Secondary | ICD-10-CM | POA: Diagnosis present

## 2011-01-24 DIAGNOSIS — E860 Dehydration: Secondary | ICD-10-CM | POA: Diagnosis present

## 2011-01-24 DIAGNOSIS — R627 Adult failure to thrive: Secondary | ICD-10-CM | POA: Diagnosis present

## 2011-01-24 DIAGNOSIS — R55 Syncope and collapse: Principal | ICD-10-CM | POA: Diagnosis present

## 2011-01-24 DIAGNOSIS — W19XXXA Unspecified fall, initial encounter: Secondary | ICD-10-CM | POA: Diagnosis present

## 2011-01-24 DIAGNOSIS — L89109 Pressure ulcer of unspecified part of back, unspecified stage: Secondary | ICD-10-CM | POA: Diagnosis present

## 2011-01-24 DIAGNOSIS — Q391 Atresia of esophagus with tracheo-esophageal fistula: Secondary | ICD-10-CM

## 2011-01-24 DIAGNOSIS — Z66 Do not resuscitate: Secondary | ICD-10-CM | POA: Diagnosis present

## 2011-01-24 DIAGNOSIS — R5381 Other malaise: Secondary | ICD-10-CM | POA: Diagnosis present

## 2011-01-24 DIAGNOSIS — F0390 Unspecified dementia without behavioral disturbance: Secondary | ICD-10-CM | POA: Diagnosis present

## 2011-01-24 DIAGNOSIS — Q393 Congenital stenosis and stricture of esophagus: Secondary | ICD-10-CM

## 2011-01-24 DIAGNOSIS — L8993 Pressure ulcer of unspecified site, stage 3: Secondary | ICD-10-CM | POA: Diagnosis present

## 2011-01-24 DIAGNOSIS — IMO0002 Reserved for concepts with insufficient information to code with codable children: Secondary | ICD-10-CM | POA: Diagnosis present

## 2011-01-24 DIAGNOSIS — R1310 Dysphagia, unspecified: Secondary | ICD-10-CM | POA: Diagnosis present

## 2011-01-24 DIAGNOSIS — F341 Dysthymic disorder: Secondary | ICD-10-CM | POA: Diagnosis present

## 2011-01-24 LAB — URINE MICROSCOPIC-ADD ON

## 2011-01-24 LAB — CARDIAC PANEL(CRET KIN+CKTOT+MB+TROPI): Total CK: 205 U/L — ABNORMAL HIGH (ref 7–177)

## 2011-01-24 LAB — DIFFERENTIAL
Basophils Absolute: 0 10*3/uL (ref 0.0–0.1)
Eosinophils Relative: 4 % (ref 0–5)
Lymphocytes Relative: 42 % (ref 12–46)
Lymphs Abs: 2.3 10*3/uL (ref 0.7–4.0)
Monocytes Absolute: 0.6 10*3/uL (ref 0.1–1.0)
Monocytes Relative: 10 % (ref 3–12)
Neutro Abs: 2.3 10*3/uL (ref 1.7–7.7)

## 2011-01-24 LAB — CBC
HCT: 35.3 % — ABNORMAL LOW (ref 36.0–46.0)
Hemoglobin: 11.4 g/dL — ABNORMAL LOW (ref 12.0–15.0)
MCH: 28.5 pg (ref 26.0–34.0)
MCHC: 32.3 g/dL (ref 30.0–36.0)
MCV: 88.3 fL (ref 78.0–100.0)

## 2011-01-24 LAB — BASIC METABOLIC PANEL
CO2: 31 mEq/L (ref 19–32)
Calcium: 9.7 mg/dL (ref 8.4–10.5)
Creatinine, Ser: 1.4 mg/dL — ABNORMAL HIGH (ref 0.4–1.2)
GFR calc Af Amer: 43 mL/min — ABNORMAL LOW (ref >60)
GFR calc non Af Amer: 36 mL/min — ABNORMAL LOW (ref >60)

## 2011-01-24 LAB — CK TOTAL AND CKMB (NOT AT ARMC): CK, MB: 2.9 ng/mL (ref 0.3–4.0)

## 2011-01-25 DIAGNOSIS — S62609A Fracture of unspecified phalanx of unspecified finger, initial encounter for closed fracture: Secondary | ICD-10-CM

## 2011-01-25 LAB — BASIC METABOLIC PANEL
Chloride: 101 mEq/L (ref 96–112)
GFR calc Af Amer: 41 mL/min — ABNORMAL LOW (ref >60)
Potassium: 3.6 mEq/L (ref 3.5–5.1)
Sodium: 141 mEq/L (ref 135–145)

## 2011-01-25 LAB — DIFFERENTIAL
Lymphocytes Relative: 48 % — ABNORMAL HIGH (ref 12–46)
Monocytes Absolute: 0.4 10*3/uL (ref 0.1–1.0)
Monocytes Relative: 10 % (ref 3–12)
Neutro Abs: 1.6 10*3/uL — ABNORMAL LOW (ref 1.7–7.7)

## 2011-01-25 LAB — CBC
HCT: 32.5 % — ABNORMAL LOW (ref 36.0–46.0)
Hemoglobin: 10.6 g/dL — ABNORMAL LOW (ref 12.0–15.0)
MCH: 29 pg (ref 26.0–34.0)
MCHC: 32.6 g/dL (ref 30.0–36.0)

## 2011-01-25 LAB — CARDIAC PANEL(CRET KIN+CKTOT+MB+TROPI)
CK, MB: 2.7 ng/mL (ref 0.3–4.0)
Total CK: 170 U/L (ref 7–177)
Troponin I: <0.3 ng/mL (ref <0.30)

## 2011-01-25 LAB — PRO B NATRIURETIC PEPTIDE: Pro B Natriuretic peptide (BNP): 503.4 pg/mL — ABNORMAL HIGH (ref 0–450)

## 2011-01-25 LAB — URINE CULTURE
Colony Count: NO GROWTH
Culture  Setup Time: 201205290054

## 2011-01-25 NOTE — H&P (Signed)
[name], [name]             ACCOUNT NO.:  [id number]  MEDICAL RECORD NO.:  [id number]           PATIENT TYPE:  I  LOCATION:  A335                          FACILITY:  [facility]  PHYSICIAN:  Jeoffrey Massed, MD    DATE OF BIRTH:  Apr 28, 1922  DATE OF ADMISSION:  01/24/2011 DATE OF DISCHARGE:  LH                             HISTORY & PHYSICAL   PRIMARY CARE PRACTITIONER:  Milus Mallick. Lodema Hong, MD  CHIEF COMPLAINT:  Syncope and left hand pain that occurred 3 days ago.  HISTORY OF PRESENT ILLNESS:  The patient is a very pleasant 77 year old female who has a past medical history of CHF with diastolic dysfunction, depression, gout, hypertension, dyslipidemia, who unfortunately has very limited mobility and is just about able to walk from her bed to the bathroom, but otherwise gets around with use of a wheelchair, who lives alone but her daughter checks on her almost on a daily basis, was brought into the hospital today for the above-noted complaints. Apparently last Friday evening the patient sustained a fall following a syncopal episode.  Per the patient this happened late in the evening when it was already dark, the patient felt dizzy and then the last seen she remember was falling down and she apparently tried to be fall at the left hand.  She then passed out.  The down time is unknown.  When she came, she found herself on the floor and was not able to get up as she could not put pressure on a hand.  She lay on the floor the whole night and then was found by her daughter Saturday morning, who called for help and finally put her and cleaned her and put her to bed.  The patient subsequently then started to have pain mostly in the left hand area. The pain has persisted over the past 2 or 3 days.  She also continues to have pain in the hip region as well.  She was then also started to have very poor p.o. intake.  Since the patient lives alone and could not care for herself anymore.  She  then presented to the emergency room for further evaluation.  The patient does not have headaches.  She did not have any chest pain or palpitations prior to passing out.  There is no history of chest pain.  There is no history shortness of breath.  There is no history of nausea, vomiting, or diarrhea as well.  The patient continues to have chronic lower extremity edema bilaterally.  Upon evaluation in the ED, the patient's left hand was x-rayed which is showing fractures involving the fourth and fifth phalanges on the left side.  She is now being admitted to the Hospitalist Service.  ALLERGIES:  ASPIRIN.  PAST MEDICAL HISTORY: 1. Esophageal ring. 2. Chronic peripheral edema. 3. History of gout. 4. History of CHF with diastolic dysfunction. 5. Decondition and debility secondary to chronic arthritis, gout,     advanced age.  Essentially bed-to-wheelchair bound.  Only able to     barely walk from her bed to bathroom. 6. Chronic kidney disease, likely stage II or III. 7. History  of chronic intermittent chronic vertigo. 8. History of chronic dysphagia. 9. Osteoarthritis. 10.Dyslipidemia. 11.Sacral decubitus ulcer, likely stage 2 or 3.  PAST SURGICAL HISTORY:  Tonsillectomy and appendectomy.  MEDICATIONS AT HOME: 1. Benazepril/hydrochlorothiazide 20/25 1 tablet p.o. daily. 2. Tessalon 100 mg 1 capsule p.o. b.i.d. p.r.n. 3. Calcium carbonate with vitamin D 500 - 200 mg unit tablets 1 tablet     three times daily. 4. Celexa 20 mg p.o. daily. 5. Plavix of 75 mg p.o. daily. 6. Colchicine 0.6 mg 1 tablet daily. 7. DuoDerm apply one patch for 3 days to the sacral area. 8. Valium 5 mg two times a day. 9. Aricept 10 mg p.r.n. 10.Zetia 10 mg 1 tablet daily. 11.Fluticasone 50 mcg nasally daily. 12.Lasix 40 mg daily. 13.Lactulose of 15 p.o. every 4 days p.r.n. 14.Mevacor 40 mg 1 tablet p.o. daily. 15.Nitroglycerin 0.4 mg under the tongue p.r.n. 16.Omeprazole 20 mg p.o. twice  daily. 17.Potassium chloride 20 mEq p.o. daily. 18.Ultram 50 mg p.o. three times a day.  FAMILY HISTORY:  Her brothers and sisters had diabetes and hypertension.  SOCIAL HISTORY:  Lives by itself, but daughter checks on her daily.  She is as noted above almost bedridden and barely able to walk from her bed to the bathroom.  To get out of the house she needs a wheelchair.  No toxic habits.  REVIEW OF SYSTEMS:  A detailed review of 12 systems were done and these are negative except for the ones mentioned in the HPI.  PHYSICAL EXAMINATION:  VITAL SIGNS:  Afebrile, heart rate of 72, blood pressure of 116/58, and pulse ox of 97% on room air. GENERAL:  Lying in bed, does not appear to be in distress. HEENT: Atraumatic, normocephalic.  Pupils equally reactive to light and accommodation.  Uvula is midline, NECK:  Supple. CHEST:  Bilaterally clear to auscultation. CARDIOVASCULAR:  Heart sounds are regular.  No murmurs heard. ABDOMEN:  Soft, slightly on the obese side.  It is nontender, nondistended. EXTREMITIES:  Two plus pitting edema bilaterally. NEUROLOGIC:  The patient is awake and alert and has no focal neurological deficit, although she has generalized weakness all over and she is at her baseline. MUSCULOSKELETAL:  Her left hand is currently in a splint.  She has significant tenderness on the dorsum of her fourth and fifth digits with some swelling.  She has intact sensation and she has good capillary refill.  She has pain with both flexion and extension of the involved digits.  She was able to move her wrist without major issues.  LABORATORY DATA: 1. CBC shows a WBC of 5.3, hemoglobin of 11.4, hematocrit of 35.3, and     a platelet count of 206. 2. Troponin is less than 0.30. 3. Urinalysis is negative and a microscopic analysis shows around 3-6     wbcs with few squamous cells and very few bacteria. 4. Chemistry shows sodium of 140, potassium 3.5, chloride of 400,     bicarb of  31, glucose of 85, BUN of 24, creatinine of 1.40, and     calcium of 9.7.  RADIOLOGICAL STUDIES: 1. CT of the head without contrast showed no skull fracture,     intracranial hemorrhage. 2. Portable chest x-ray showed mild cardiomegaly without edema. 3. Tortuous thoracic aorta.  Suspect bilateral chronic rotator cuff     tears based on acromiohumeral distance.  Irregular right humeral     head, probably from spurring, although if the patient has acute     right shoulder  pain then dedicated right shoulder conventional     radiograph would be recommended. 4. X-ray of the left wrist showed no acute wrist fracture. 5. X-ray of the left hand shows transverse fracture at the base of the     fourth and fifth proximal phalanges.  Osteoarthritis.  ASSESSMENT: 1. Syncopal episode. 2. Fracture of her left fourth and fifth digits. 3. Dehydration. 4. Adult failure to thrive syndrome with advanced debility and     deconditioning. 5. Hypertension, currently controlled. 6. Likely stage II to III chronic kidney disease at baseline. 7. Dyslipidemia. 8. Anxiety with depression, currently stable.  PLAN: 1. The patient will be admitted to a telemetry floor. 2. Cardiac enzymes will be cycled.  She will be started on her usual     antihypertensive medications along with Plavix. 3. Further plan will depend as the patient's clinical course evolves. 4. In regards to her fractures of the left fourth and fifth digits, an     Orthopedic consult will be placed, her left hand is currently     placed on a splint by the ED physician. 5. In regards to chronic of lower extremity edema, her Lasix will be     continued. 6. In regards to gout, her colchicine will be continued. 7. In regards to anxiety and depression, her diazepam and Valium will     be continued. 8. We will get physical therapy, speech therapy, and nutritional     service to evaluate as well. 9. Apparently she has had problems with dysphagia in  the past, we put     her on a dysphagia III diet for now and then address diet per     speech therapy. 10.Wound care consultation will be obtained. 11.Code status.  The patient is a do not resuscitate. 12.DVT prophylaxis with Lovenox.  Total time spent 65 minutes.     Jeoffrey Massed, MD     SG/MEDQ  D:  01/24/2011  T:  01/24/2011  Job:  [id number]  cc:   Milus Mallick. Lodema Hong, M.D. Fax: [phone]  Gerrit Friends. Dietrich Pates, MD, [facility] [address] [place], [place] [address]  Electronically Signed by Jeoffrey Massed  on 01/25/2011 64:33:29 PM

## 2011-01-26 LAB — BASIC METABOLIC PANEL
BUN: 34 mg/dL — ABNORMAL HIGH (ref 6–23)
Creatinine, Ser: 1.81 mg/dL — ABNORMAL HIGH (ref 0.4–1.2)
GFR calc non Af Amer: 26 mL/min — ABNORMAL LOW (ref >60)
Glucose, Bld: 91 mg/dL (ref 70–99)
Potassium: 4.2 mEq/L (ref 3.5–5.1)

## 2011-01-26 LAB — PRO B NATRIURETIC PEPTIDE: Pro B Natriuretic peptide (BNP): 246 pg/mL (ref 0–450)

## 2011-01-26 NOTE — Discharge Summary (Signed)
  [name], [name]             ACCOUNT NO.:  [id number]  MEDICAL RECORD NO.:  [id number]           PATIENT TYPE:  I  LOCATION:  A335                          FACILITY:  [facility]  PHYSICIAN:  Wilson Singer, M.D.DATE OF BIRTH:  25-Jun-1922  DATE OF ADMISSION:  01/24/2011 DATE OF DISCHARGE:  LH                         DISCHARGE SUMMARY-REFERRING   ADDENDUM:  Please remove hydrochlorothiazide (HCTZ) 25 mg daily from the discharge medications.     Wilson Singer, M.D.     NCG/MEDQ  D:  01/26/2011  T:  01/26/2011  Job:  [id number]  Electronically Signed by Lilly Cove M.D. on 01/26/2011 12:33:43 PM

## 2011-01-26 NOTE — Discharge Summary (Signed)
[name], [name]             ACCOUNT NO.:  [id number]  MEDICAL RECORD NO.:  [id number]           PATIENT TYPE:  I  LOCATION:  A335                          FACILITY:  [facility]  PHYSICIAN:  Wilson Singer, M.D.DATE OF BIRTH:  [birth year]  DATE OF ADMISSION:  01/24/2011 DATE OF DISCHARGE:  05/30/2012LH                         DISCHARGE SUMMARY-REFERRING   PRIMARY CARE PHYSICIAN:  Dr. Syliva [name]  FINAL DISCHARGE DIAGNOSES: 1. Syncope of unclear origin, cardiac ischemia ruled out, neurological     event ruled out. 2. Left hand fracture involving the bases of the fourth and fifth     proximal phalanges. 3. Mild dementia. 4. Dysphagia secondary to esophageal ring, on dysphagia II with thin     liquid diet. 5. History of diastolic dysfunction stable. 6. Chronic kidney disease stage II to III. 7. Decondition and debility secondary to chronic arthritis, gout, bed     to wheelchair bound. 8. Sacral decubitus ulcer likely stage 2 to 3.MEDICATIONS ON DISCHARGE: 1. Benazepril 20 mg daily. 2. Citalopram 20 mg daily. 3. Diazepam 5 mg t.i.d. p.r.n. 4. Fluticasone nasal spray, 1 spray p.r.n. 5. Furosemide 40 mg daily. 6. Lactulose 15 mL every 4 days p.r.n. 7. Lovastatin 40 mg  nightly.89. Nitroglycerin sublingual 0.4 mg 1 tablet p.r.n. 8.Plavix 75 mg daily. 9.Potassium chloride 20 mEq daily. 10.Tramadol 50 mg t.i.d. 11.Aricept 10 mg daily. 12.Colchicine 0.6 mg daily. 13.Calcium carbonate 500 mg 1 tablet 3 times a day. 14.Omeprazole 20 mg b.i.d.  CONDITION ON DISCHARGE:  Stable.  HISTORY:  This is somewhat pleasantly confused lady of 77 years old who was admitted having had syncopal episode and left hand pain which had occurred 3 days prior to admission.  Please see initial history and physical examination by Dr. Daphane [name].  HOSPITAL PROGRESS:  The patient was admitted and indeed radiological investigation of the left hand showed fracture, transverse fracture of the proximal  phalanges of the fourth and fifth fingers.  The patient was seen by Dr. Romeo [name], orthopedics, who recommended splinting and follow up in 4 weeks' time.  As far as a syncopal episode is concerned, there is no clear etiology.  Serial cardiac enzymes were negative.  There was no evidence of heart failure clinically.  She has been seen by speech language pathology who observed that she was coughing with ice cold drinks and they have recommended a dysphagia II diet with thin liquids. She has also been reviewed by physical therapy who is recommending skilled nursing facility at discharge.  It was felt that she needed moderate to maximal assist to transfer from bed to chair.  She has also been seen by Occupational Therapy and they have been following her.  PHYSICAL EXAMINATION:  VITAL SIGNS:  On physical examination today, temperature 97.5, blood pressure 112/67, pulse 63, saturation 96% on 2 liters oxygen. HEART:  Sounds are present normal. LUNG:  Fields are clear. NEUROLOGICAL:  She is alert and appears to be oriented but her description of her syncopal episode makes me wonder that she may have an element of mild dementia.  Investigations today showed sodium of 140, potassium 4.2, bicarbonate 37, BUN 34, creatinine 1.81.  I think this is her baseline level although she was admitted with a lower creatinine of 1.4.  She will need to be hydrated to some degree a little bit better orally.  Most recent CBC done yesterday shows a hemoglobin of 10.6, white blood cell count 4.4, and platelets 189.  Urine culture is negative.  Chest x-ray shows mild cardiomegaly, but there is no pulmonary edema, and there is no evidence of pneumonia.  CT scan of her head was also done on admission and this shows no skull fracture or intracranial hemorrhage or any other pathology.  DISPOSITION:  The patient is stable from standpoint of needing hospitalization, and I think she can be discharged to a skilled  nursing facility soon.  Obviously, she will need follow up on her renal function and oral fluids will need to be encouraged.  She will require follow up with Dr. Romeo [name] in 4 weeks' time with x-rays and the splint should be kept in place for 4 weeks.  She will need ongoing physical therapy for mobility in the skilled nursing facility.  Her swallowing will need to be reevaluated at a later date.  She has seen Dr. Karilyn [name], gastroenterologist, in the past I believe and a follow up with him might well be appropriate.  She did have a esophageal dilatation in January 2012, and I believe she most likely does have a followup appointment, but this needs to be verified.     Wilson Singer, M.D.     NCG/MEDQ  D:  01/26/2011  T:  01/26/2011  Job:  [id number]  cc:   Milus Mallick. Lodema Hong, M.D. Fax: [phone]  Electronically Signed by [name] M.D. on 01/26/2011 12:35:42 PM

## 2011-01-27 ENCOUNTER — Inpatient Hospital Stay
Admission: RE | Admit: 2011-01-27 | Discharge: 2011-08-04 | Disposition: A | Discharge status: Other Acute Inpatient Hospital | Payer: Medicaid Other | Source: Ambulatory Visit | Attending: Internal Medicine | Admitting: Internal Medicine

## 2011-01-27 DIAGNOSIS — R05 Cough: Secondary | ICD-10-CM

## 2011-01-27 DIAGNOSIS — R609 Edema, unspecified: Principal | ICD-10-CM

## 2011-01-27 DIAGNOSIS — S62609A Fracture of unspecified phalanx of unspecified finger, initial encounter for closed fracture: Secondary | ICD-10-CM

## 2011-01-27 DIAGNOSIS — R059 Cough, unspecified: Secondary | ICD-10-CM

## 2011-01-27 DIAGNOSIS — IMO0001 Reserved for inherently not codable concepts without codable children: Secondary | ICD-10-CM

## 2011-01-27 HISTORY — DX: Acute myocardial infarction, unspecified: I21.9

## 2011-01-27 LAB — BASIC METABOLIC PANEL
BUN: 35 mg/dL — ABNORMAL HIGH (ref 6–23)
CO2: 37 mEq/L — ABNORMAL HIGH (ref 19–32)
Calcium: 7.6 mg/dL — ABNORMAL LOW (ref 8.4–10.5)
Chloride: 96 mEq/L (ref 96–112)
Creatinine, Ser: 2.11 mg/dL — ABNORMAL HIGH (ref 0.4–1.2)
GFR calc Af Amer: 27 mL/min — ABNORMAL LOW (ref >60)

## 2011-01-27 NOTE — Discharge Summary (Signed)
  NAMELASHAWN, BROMWELL             ACCOUNT NO.:  [id number]  MEDICAL RECORD NO.:  [id number]           PATIENT TYPE:  I  LOCATION:  A335                          FACILITY:  [facility]  PHYSICIAN:  Wilson Singer, M.D.DATE OF BIRTH:  05-Jan-1922  DATE OF ADMISSION:  01/24/2011 DATE OF DISCHARGE:  LH                         DISCHARGE SUMMARY-REFERRING   Medications on discharge have been changed and medications on discharge are: 1. Benazepril 20 mg daily. 2. Citalopram 20 mg daily. 3. Diazepam 5 mg t.i.d. p.r.n. 4. Fluticasone nasal spray, 1 spray p.r.n. 5. Furosemide 20 mg daily. 6. Lactulose 15 mL every 4 days p.r.n. 7. Lovastatin 40 mg at bedtime. 8. Nitroglycerin sublingual 0.4 mg 1 tablet p.r.n. 9. Plavix 75 mg daily. 10.Potassium chloride 20 mEq daily. 11.Tramadol 50 mg t.i.d. 12.Aricept 10 mg daily. 13.Colchicine 0.6 mg daily. 14.Calcium carbonate 500 mg 1 tablet t.i.d. 15.Omeprazole 20 mg b.i.d.  This lady was due to be discharged yesterday but she had worsening renal failure and indeed this morning her numbers looked a little bit worse also.  This morning her sodium is 137, potassium is 4.0, bicarbonate 37, BUN 35, creatinine 2.11.  She looks well and hemodynamically she is well.  On physical examination, temperature 98.3, blood pressure 103/63, pulse 56, saturation 97% on room air.  She is eating well and she is clinically stable to be discharged.  I have reduced her furosemide in view of the worsening renal failure and I think that a close check will have to be kept on a renal function.  If the renal function deteriorates, then all diuretics and may be even ACE inhibitor will need to be discontinued and I would suggest a calcium blocker for her hypertension and diastolic heart failure. Again followup needs to be with Dr. Romeo Apple in 4 weeks' time for her fracture of the left hand, phalanges, and also Dr. Karilyn Cota to reevaluate her swallowing.    Wilson Singer,  M.D.    NCG/MEDQ  D:  01/27/2011  T:  01/27/2011  Job:  [id number]  Electronically Signed by Lilly Cove M.D. on 01/27/2011 01:31:31 PM

## 2011-01-31 NOTE — Consult Note (Signed)
  [name], KELSON NO.:  [id number]  MEDICAL RECORD NO.:  [id number]           PATIENT TYPE:  LOCATION:                                 FACILITY:  PHYSICIAN:  Vickki Hearing, M.D.DATE OF BIRTH:  [birth year]  DATE OF CONSULTATION: DATE OF DISCHARGE:                                CONSULTATION   Consult requested by Dr. Jerral Ralph.  History and physical reviewed as dictated incorporated by reference. This is an 77 year old female who came in for syncope.  Has history of peripheral edema, gout, CHF, diastolic dysfunction, arthritis, chronic kidney disease, chronic intermittent vertigo, dysphasia, osteoarthritis, dyslipidemia, sacral ulcer, previous tonsillectomy, and appendectomy. Fell and fractured her left small and ring finger at the proximal phalanx.  Based on the patient's medical condition, she is in no condition for surgery, recommend splinting.  X-rays to follow in 2-3 weeks.  IMPRESSION:  Fracture of small and ring finger, left hand.  PLAN:  For splinting and followup films until healing.     Vickki Hearing, M.D.     SEH/MEDQ  D:  01/25/2011  T:  01/26/2011  Job:  [id number]  Electronically Signed by Fuller Canada M.D. on 01/31/2011 02:41:53 PM

## 2011-02-01 ENCOUNTER — Ambulatory Visit (HOSPITAL_COMMUNITY): Payer: Medicare Other | Attending: Internal Medicine

## 2011-02-01 DIAGNOSIS — M79609 Pain in unspecified limb: Secondary | ICD-10-CM | POA: Insufficient documentation

## 2011-02-01 DIAGNOSIS — M7989 Other specified soft tissue disorders: Secondary | ICD-10-CM | POA: Insufficient documentation

## 2011-02-08 ENCOUNTER — Ambulatory Visit (HOSPITAL_COMMUNITY): Payer: Medicare Other | Attending: Internal Medicine

## 2011-02-08 DIAGNOSIS — M79609 Pain in unspecified limb: Secondary | ICD-10-CM | POA: Insufficient documentation

## 2011-02-17 ENCOUNTER — Encounter: Payer: Self-pay | Admitting: Orthopedic Surgery

## 2011-02-17 ENCOUNTER — Encounter: Payer: Self-pay | Admitting: Family Medicine

## 2011-02-17 ENCOUNTER — Ambulatory Visit (INDEPENDENT_AMBULATORY_CARE_PROVIDER_SITE_OTHER): Payer: Medicare Other | Admitting: Orthopedic Surgery

## 2011-02-17 DIAGNOSIS — S62609A Fracture of unspecified phalanx of unspecified finger, initial encounter for closed fracture: Secondary | ICD-10-CM

## 2011-02-17 NOTE — Progress Notes (Signed)
  Followup visit.  Fractures of the proximal phalanx of #4 digit and #5 digit LEFT hand.  X-rays scheduled for today.  Date of injury May 28.  Initial treatment splinting.  Today, his films 3 views, LEFT hand. There is a proximal phalanx fracture. A #5 and #4 proximal phalanges, LEFT hand. There is significant osteopenia.   minimal angulation on the lateral views.  Impression 4th and 5th digit fractures of the proximal phalanx, LEFT hand  Hand Therapy for 6 weeks  Return in 6 weeks

## 2011-03-01 ENCOUNTER — Ambulatory Visit (INDEPENDENT_AMBULATORY_CARE_PROVIDER_SITE_OTHER): Payer: Medicare Other | Admitting: Internal Medicine

## 2011-03-07 LAB — GLUCOSE, CAPILLARY: Glucose-Capillary: 155 mg/dL — ABNORMAL HIGH (ref 70–99)

## 2011-03-14 ENCOUNTER — Encounter: Payer: Self-pay | Admitting: Family Medicine

## 2011-03-16 ENCOUNTER — Ambulatory Visit (HOSPITAL_COMMUNITY): Payer: Medicare Other | Attending: Internal Medicine

## 2011-03-16 DIAGNOSIS — R0989 Other specified symptoms and signs involving the circulatory and respiratory systems: Secondary | ICD-10-CM | POA: Insufficient documentation

## 2011-03-22 ENCOUNTER — Ambulatory Visit: Payer: Medicare Other | Admitting: Family Medicine

## 2011-04-05 ENCOUNTER — Ambulatory Visit (INDEPENDENT_AMBULATORY_CARE_PROVIDER_SITE_OTHER): Payer: Medicare Other | Admitting: Orthopedic Surgery

## 2011-04-05 DIAGNOSIS — S62609A Fracture of unspecified phalanx of unspecified finger, initial encounter for closed fracture: Secondary | ICD-10-CM

## 2011-04-05 NOTE — Progress Notes (Signed)
Followup visit.  Fractures of the proximal phalanx of #4 digit and #5 digit LEFT hand.  Most of the patient's pain has resolved she can almost make a full fist and her range of motion matches the other 2 digits in the hand  She has been discharged.

## 2011-04-14 ENCOUNTER — Ambulatory Visit (HOSPITAL_COMMUNITY): Payer: Medicare Other

## 2011-04-14 ENCOUNTER — Ambulatory Visit (HOSPITAL_COMMUNITY): Payer: Medicare Other | Attending: Internal Medicine

## 2011-04-14 DIAGNOSIS — R05 Cough: Secondary | ICD-10-CM | POA: Insufficient documentation

## 2011-04-14 DIAGNOSIS — I10 Essential (primary) hypertension: Secondary | ICD-10-CM | POA: Insufficient documentation

## 2011-04-14 DIAGNOSIS — R059 Cough, unspecified: Secondary | ICD-10-CM | POA: Insufficient documentation

## 2011-04-20 LAB — GLUCOSE, CAPILLARY: Glucose-Capillary: 92 mg/dL (ref 70–99)

## 2011-04-22 LAB — GLUCOSE, CAPILLARY: Glucose-Capillary: 104 mg/dL — ABNORMAL HIGH (ref 70–99)

## 2011-04-27 LAB — GLUCOSE, CAPILLARY: Glucose-Capillary: 105 mg/dL — ABNORMAL HIGH (ref 70–99)

## 2011-04-29 LAB — GLUCOSE, CAPILLARY: Glucose-Capillary: 96 mg/dL (ref 70–99)

## 2011-05-06 LAB — GLUCOSE, CAPILLARY: Glucose-Capillary: 94 mg/dL (ref 70–99)

## 2011-05-18 LAB — GLUCOSE, CAPILLARY: Glucose-Capillary: 99 mg/dL (ref 70–99)

## 2011-05-20 LAB — COMPREHENSIVE METABOLIC PANEL
BUN: 12
CO2: 34 — ABNORMAL HIGH
Chloride: 95 — ABNORMAL LOW
Creatinine, Ser: 1.01
GFR calc non Af Amer: 52 — ABNORMAL LOW
Glucose, Bld: 95
Total Bilirubin: 0.7

## 2011-05-20 LAB — B-NATRIURETIC PEPTIDE (CONVERTED LAB): Pro B Natriuretic peptide (BNP): <30

## 2011-05-20 LAB — DIFFERENTIAL
Basophils Absolute: 0.1
Eosinophils Relative: 2
Lymphocytes Relative: 31
Neutro Abs: 3.6
Neutrophils Relative %: 56

## 2011-05-20 LAB — POCT CARDIAC MARKERS
CKMB, poc: 1.8
Myoglobin, poc: 124
Operator id: 264761

## 2011-05-20 LAB — CBC
RBC: 4.19
WBC: 6.6

## 2011-05-20 LAB — GLUCOSE, CAPILLARY: Glucose-Capillary: 92 mg/dL (ref 70–99)

## 2011-05-20 LAB — D-DIMER, QUANTITATIVE: D-Dimer, Quant: 0.26

## 2011-05-27 LAB — GLUCOSE, CAPILLARY: Glucose-Capillary: 101 mg/dL — ABNORMAL HIGH (ref 70–99)

## 2011-05-30 ENCOUNTER — Encounter (INDEPENDENT_AMBULATORY_CARE_PROVIDER_SITE_OTHER): Payer: Self-pay | Admitting: Internal Medicine

## 2011-05-30 ENCOUNTER — Ambulatory Visit (INDEPENDENT_AMBULATORY_CARE_PROVIDER_SITE_OTHER): Payer: Medicare Other | Admitting: Internal Medicine

## 2011-05-30 VITALS — BP 152/80 | HR 60 | Temp 98.5°F | Ht 59.0 in | Wt 176.0 lb

## 2011-05-30 DIAGNOSIS — R1319 Other dysphagia: Secondary | ICD-10-CM

## 2011-05-30 DIAGNOSIS — R1310 Dysphagia, unspecified: Secondary | ICD-10-CM

## 2011-05-30 DIAGNOSIS — R1314 Dysphagia, pharyngoesophageal phase: Secondary | ICD-10-CM

## 2011-05-30 LAB — GLUCOSE, CAPILLARY: Glucose-Capillary: 139 mg/dL — ABNORMAL HIGH (ref 70–99)

## 2011-05-30 NOTE — Progress Notes (Signed)
Subjective:     Patient ID: [name], female   DOB: Apr 22, 1922, 77 y.o.   MRN: [id number]  HPI [name] is a 77 yr old female here today for acid reflux and dysphagia.  She is burning in her throat.  She says her foods are pureed ath the [facility]. Foods are lodging.  She says even liquids are lodging.  She has burning in her stomach when she coughs.   She has had symptoms since May. Daughter says her symptoms are worse.  She is coughing up a clear mucous.  Her appetite is good. No weight loss. She underwent and EGD/ED for dysphagia in January. EGD/ED revealed noncritical ring at the GE junction which was dilated and disrupted with a balloon to 19mm. A 3 cmm sliding hiatal hernia.  His recommendations: If she continues to have dysphagia, would consider barium pill esoghram.    Review of Systems see hpi      No current outpatient prescriptions on file.   Past Surgical History  Procedure Date  . Appendectomy   . Tonsillectomy    Current Outpatient Prescriptions on File Prior to Visit  Medication Sig Dispense Refill  . Acetaminophen (TYLENOL 8 HOUR PO) Take by mouth.        . benzonatate (TESSALON) 100 MG capsule Take 100 mg by mouth. Take 1 capsule by mouth two times a day as needed for cough       . Calcium Carbonate-Vitamin D (OYSTER SHELL CALCIUM/D) 500-200 MG-UNIT TABS Take by mouth. Take one tablet by mouth three times a day       . CITALOPRAM HYDROBROMIDE PO Take 20 mg by mouth daily.        . clopidogrel (PLAVIX) 75 MG tablet Take 75 mg by mouth daily.        . diazepam (VALIUM) 5 MG tablet TAKE (1) TABLET BY MOUTH THREE TIMES DAILY ASNEEDED.  90 tablet  2  . fluticasone (FLONASE) 50 MCG/ACT nasal spray 1 spray by Nasal route daily.        Marland Kitchen lactulose (CHRONULAC) 10 GM/15ML solution TAKE 15 MLs BY MOUTH     EVERY 4 DAYS AS NEEDEDFOR CONSTIPATION.  120 mL  0  . lactulose (CHRONULAC) 10 GM/15ML solution Take 20 g by mouth. Take one tablespoon by mouth every four days as needed  for constipation       . LOTENSIN HCT 20-25 MG per tablet TAKE 1/2 TABLET BY MOUTH ONCE DAILY FOR BLOODPRESSURE.  15 each  2  . lovastatin (MEVACOR) 40 MG tablet Take 1 tablet (40 mg total) by mouth daily.  30 tablet  11   Past Medical History  Diagnosis Date  . Cellulitis   . Dementia   . Anxiety   . Osteoarthritis   . Hyperlipidemia   . Hypertension   . Syncope   . Muscle weakness   . Gait abnormality   . GERD (gastroesophageal reflux disease)   . Gout      Objective:   Physical Exam  Filed Vitals:   05/30/11 1052  BP: 152/80  Pulse: 60  Temp: 98.5 F (36.9 C)  Height: 4\' 11"  (1.499 m)  Weight: 176 lb (79.833 kg)     Please note, patient was examined from wheel chair. She is unable to stand.  Alert and oriented. Skin warm and dry. Oral mucosa is moist. Endentulous Sclera anicteric, conjunctivae is pink. Thyroid not enlarged. No cervical lymphadenopathy. Bilateral wheezes. Heart regular rate and rhythm.  Abdomen is  soft. Bowel sounds are positive  No abdominal masses felt. No tenderness.   Swelling to rt lower extemitiy.(Hx of gout). Patient is alert and oriented.  Daughter and patient are satisfied with her care given.  All questions were answered.    Assessment:  GERD, dysphagia, Her recent EGD/ED revealed a noncritical ring at the GE junction.   Cough: She is coughing up clear mucous. She does have bilateral wheezes. She is presently taking Pessalon Perle for her cough.  Plan:    Barium Pill esophagram.  Will start Protionix 40mg  BID.  Will stop the Zantac BID.  HOB needs to be elevated.  Once I have the Esophogram will discuss with Dr. Karilyn Cota.

## 2011-06-01 LAB — GLUCOSE, CAPILLARY: Glucose-Capillary: 101 mg/dL — ABNORMAL HIGH (ref 70–99)

## 2011-06-06 ENCOUNTER — Ambulatory Visit (HOSPITAL_COMMUNITY)
Admit: 2011-06-06 | Discharge: 2011-06-06 | Disposition: A | Discharge status: Skilled Nursing Facility | Payer: Medicare Other | Source: Skilled Nursing Facility | Attending: Internal Medicine | Admitting: Internal Medicine

## 2011-06-06 DIAGNOSIS — R1319 Other dysphagia: Secondary | ICD-10-CM

## 2011-06-06 DIAGNOSIS — R1310 Dysphagia, unspecified: Secondary | ICD-10-CM | POA: Insufficient documentation

## 2011-06-06 DIAGNOSIS — K225 Diverticulum of esophagus, acquired: Secondary | ICD-10-CM | POA: Insufficient documentation

## 2011-06-08 LAB — GLUCOSE, CAPILLARY: Glucose-Capillary: 92 mg/dL (ref 70–99)

## 2011-06-17 LAB — GLUCOSE, CAPILLARY: Glucose-Capillary: 93 mg/dL (ref 70–99)

## 2011-06-20 LAB — GLUCOSE, CAPILLARY: Glucose-Capillary: 106 mg/dL — ABNORMAL HIGH (ref 70–99)

## 2011-06-24 LAB — GLUCOSE, CAPILLARY: Glucose-Capillary: 93 mg/dL (ref 70–99)

## 2011-06-27 ENCOUNTER — Ambulatory Visit (HOSPITAL_COMMUNITY)
Admit: 2011-06-27 | Discharge: 2011-06-27 | Disposition: A | Discharge status: Skilled Nursing Facility | Payer: Medicare Other | Source: Skilled Nursing Facility | Attending: Internal Medicine | Admitting: Internal Medicine

## 2011-06-27 ENCOUNTER — Ambulatory Visit (HOSPITAL_COMMUNITY): Payer: Medicare Other

## 2011-06-27 DIAGNOSIS — M79609 Pain in unspecified limb: Secondary | ICD-10-CM | POA: Insufficient documentation

## 2011-06-27 LAB — GLUCOSE, CAPILLARY: Glucose-Capillary: 99 mg/dL (ref 70–99)

## 2011-06-30 LAB — GLUCOSE, CAPILLARY: Glucose-Capillary: 110 mg/dL — ABNORMAL HIGH (ref 70–99)

## 2011-07-04 LAB — GLUCOSE, CAPILLARY: Glucose-Capillary: 69 mg/dL — ABNORMAL LOW (ref 70–99)

## 2011-07-06 LAB — GLUCOSE, CAPILLARY: Glucose-Capillary: 88 mg/dL (ref 70–99)

## 2011-07-08 LAB — GLUCOSE, CAPILLARY: Glucose-Capillary: 85 mg/dL (ref 70–99)

## 2011-07-11 LAB — GLUCOSE, CAPILLARY: Glucose-Capillary: 99 mg/dL (ref 70–99)

## 2011-07-13 ENCOUNTER — Telehealth: Payer: Self-pay

## 2011-07-13 NOTE — Telephone Encounter (Signed)
Dr at [facility] needs to know a diagnosis for the plavix and how long she has been on it. [phone] ask for her nurse

## 2011-07-13 NOTE — Telephone Encounter (Signed)
Past h/o stroke on record in 2002, though MRI brain does not state this. Please check with the ca and see if they can give you an idea of duration of med and contact the nurse with the info, thank you

## 2011-07-14 NOTE — Telephone Encounter (Signed)
[facility] said the record shows back to 2009 but hasn't had it filled there since May 2012. Nurse will call back from [facility]

## 2011-07-14 NOTE — Telephone Encounter (Signed)
[facility] aware

## 2011-07-15 LAB — GLUCOSE, CAPILLARY: Glucose-Capillary: 115 mg/dL — ABNORMAL HIGH (ref 70–99)

## 2011-07-20 LAB — GLUCOSE, CAPILLARY

## 2011-07-22 LAB — GLUCOSE, CAPILLARY: Glucose-Capillary: 91 mg/dL (ref 70–99)

## 2011-07-25 LAB — GLUCOSE, CAPILLARY: Glucose-Capillary: 93 mg/dL (ref 70–99)

## 2011-07-27 LAB — GLUCOSE, CAPILLARY: Glucose-Capillary: 156 mg/dL — ABNORMAL HIGH (ref 70–99)

## 2011-07-28 ENCOUNTER — Encounter (HOSPITAL_COMMUNITY): Payer: Medicare Other

## 2011-07-28 ENCOUNTER — Non-Acute Institutional Stay: Payer: Self-pay

## 2011-07-28 ENCOUNTER — Encounter (HOSPITAL_COMMUNITY): Payer: Self-pay | Admitting: Pharmacy Technician

## 2011-07-28 LAB — BASIC METABOLIC PANEL
BUN: 33 mg/dL — ABNORMAL HIGH (ref 6–23)
Creatinine, Ser: 1.55 mg/dL — ABNORMAL HIGH (ref 0.50–1.10)
GFR calc Af Amer: 34 mL/min — ABNORMAL LOW (ref >90)
GFR calc non Af Amer: 29 mL/min — ABNORMAL LOW (ref >90)

## 2011-07-28 LAB — CBC
MCHC: 32.7 g/dL (ref 30.0–36.0)
Platelets: 233 10*3/uL (ref 150–400)
RDW: 14.5 % (ref 11.5–15.5)
WBC: 5.9 10*3/uL (ref 4.0–10.5)

## 2011-07-28 NOTE — Patient Instructions (Addendum)
20 PORSHEA JANOWSKI  07/28/2011   Your procedure is scheduled on:   08/04/2011  Report to [facility] at  1120 AM.  Call this number if you have problems the morning of surgery: [phone]   Remember:   Do not eat food:After Midnight.  May have clear liquids:until Midnight .  Clear liquids include soda, tea, black coffee, apple or grape juice, broth.  Take these medicines the morning of surgery with A SIP OF WATER: lotensin,aricept,protonix   Do not wear jewelry, make-up or nail polish.  Do not wear lotions, powders, or perfumes. You may wear deodorant.  Do not shave 48 hours prior to surgery.  Do not bring valuables to the hospital.  Contacts, dentures or bridgework may not be worn into surgery.  Leave suitcase in the car. After surgery it may be brought to your room.  For patients admitted to the hospital, checkout time is 11:00 AM the day of discharge.   Patients discharged the day of surgery will not be allowed to drive home.  Name and phone number of your driver: family  Special Instructions: N/A   Please read over the following fact sheets that you were given: Pain Booklet, Surgical Site Infection Prevention, Anesthesia Post-op Instructions and Care and Recovery After Surgery

## 2011-08-04 ENCOUNTER — Encounter (HOSPITAL_COMMUNITY): Payer: Self-pay | Admitting: Anesthesiology

## 2011-08-04 ENCOUNTER — Ambulatory Visit (HOSPITAL_COMMUNITY): Payer: Medicare Other | Admitting: Anesthesiology

## 2011-08-04 ENCOUNTER — Inpatient Hospital Stay
Admission: RE | Admit: 2011-08-04 | Discharge: 2011-08-25 | Disposition: A | Discharge status: Other Acute Inpatient Hospital | Payer: Medicaid Other | Source: Ambulatory Visit | Attending: Internal Medicine | Admitting: Internal Medicine

## 2011-08-04 ENCOUNTER — Encounter (HOSPITAL_COMMUNITY)
Admission: RE | Disposition: A | Discharge status: Home / Self Care | Payer: Self-pay | Source: Ambulatory Visit | Attending: Ophthalmology

## 2011-08-04 ENCOUNTER — Encounter (HOSPITAL_COMMUNITY): Payer: Self-pay | Admitting: *Deleted

## 2011-08-04 ENCOUNTER — Ambulatory Visit (HOSPITAL_COMMUNITY)
Admission: RE | Admit: 2011-08-04 | Discharge: 2011-08-04 | Disposition: A | Discharge status: Home / Self Care | Payer: Medicare Other | Source: Ambulatory Visit | Attending: Ophthalmology | Admitting: Ophthalmology

## 2011-08-04 DIAGNOSIS — H2589 Other age-related cataract: Secondary | ICD-10-CM | POA: Insufficient documentation

## 2011-08-04 DIAGNOSIS — Z01812 Encounter for preprocedural laboratory examination: Secondary | ICD-10-CM | POA: Insufficient documentation

## 2011-08-04 DIAGNOSIS — I10 Essential (primary) hypertension: Secondary | ICD-10-CM | POA: Insufficient documentation

## 2011-08-04 DIAGNOSIS — Z79899 Other long term (current) drug therapy: Secondary | ICD-10-CM | POA: Insufficient documentation

## 2011-08-04 DIAGNOSIS — E785 Hyperlipidemia, unspecified: Secondary | ICD-10-CM | POA: Insufficient documentation

## 2011-08-04 HISTORY — PX: CATARACT EXTRACTION W/PHACO: SHX586

## 2011-08-04 SURGERY — PHACOEMULSIFICATION, CATARACT, WITH IOL INSERTION
Anesthesia: Monitor Anesthesia Care | Site: Eye | Laterality: Right | Wound class: Clean

## 2011-08-04 MED ORDER — LIDOCAINE HCL (PF) 1 % IJ SOLN
INTRAMUSCULAR | Status: AC
  Filled 2011-08-04: qty 2

## 2011-08-04 MED ORDER — CYCLOPENTOLATE-PHENYLEPHRINE 0.2-1 % OP SOLN
OPHTHALMIC | Status: AC
  Filled 2011-08-04: qty 2

## 2011-08-04 MED ORDER — BSS IO SOLN
INTRAOCULAR | Status: DC | PRN
Start: 2011-08-04 — End: 2011-08-04
  Administered 2011-08-04: 15 mL via INTRAOCULAR

## 2011-08-04 MED ORDER — LIDOCAINE HCL 3.5 % OP GEL
OPHTHALMIC | Status: AC
  Filled 2011-08-04: qty 5

## 2011-08-04 MED ORDER — SODIUM HYALURONATE 16 MG/ML IO SOLN
INTRAOCULAR | Status: DC | PRN
  Administered 2011-08-04: 0.8 mL via INTRAOCULAR

## 2011-08-04 MED ORDER — MIDAZOLAM HCL 2 MG/2ML IJ SOLN
INTRAMUSCULAR | Status: AC
  Filled 2011-08-04: qty 2

## 2011-08-04 MED ORDER — NEOMYCIN-POLYMYXIN-DEXAMETH 3.5-10000-0.1 OP OINT
TOPICAL_OINTMENT | OPHTHALMIC | Status: AC
  Filled 2011-08-04: qty 3.5

## 2011-08-04 MED ORDER — PHENYLEPHRINE HCL 2.5 % OP SOLN
OPHTHALMIC | Status: AC
  Filled 2011-08-04: qty 2

## 2011-08-04 MED ORDER — TETRACAINE HCL 0.5 % OP SOLN
OPHTHALMIC | Status: AC
  Filled 2011-08-04: qty 2

## 2011-08-04 MED ORDER — LACTATED RINGERS IV SOLN
INTRAVENOUS | Status: DC
  Administered 2011-08-04: 13:00:00 via INTRAVENOUS

## 2011-08-04 MED ORDER — POVIDONE-IODINE 5 % OP SOLN
OPHTHALMIC | Status: DC | PRN
  Administered 2011-08-04: 1 via OPHTHALMIC

## 2011-08-04 MED ORDER — PHENYLEPHRINE HCL 2.5 % OP SOLN
1.0000 [drp] | OPHTHALMIC | Status: AC
  Administered 2011-08-04 (×3): 1 [drp] via OPHTHALMIC

## 2011-08-04 MED ORDER — TETRACAINE HCL 0.5 % OP SOLN
1.0000 [drp] | OPHTHALMIC | Status: AC
  Administered 2011-08-04 (×3): 1 [drp] via OPHTHALMIC

## 2011-08-04 MED ORDER — EPINEPHRINE HCL 1 MG/ML IJ SOLN
INTRAMUSCULAR | Status: AC
  Filled 2011-08-04: qty 1

## 2011-08-04 MED ORDER — MIDAZOLAM HCL 2 MG/2ML IJ SOLN
1.0000 mg | INTRAMUSCULAR | Status: DC | PRN
  Administered 2011-08-04: 2 mg via INTRAVENOUS

## 2011-08-04 MED ORDER — CYCLOPENTOLATE-PHENYLEPHRINE 0.2-1 % OP SOLN
1.0000 [drp] | OPHTHALMIC | Status: AC
  Administered 2011-08-04 (×3): 1 [drp] via OPHTHALMIC

## 2011-08-04 MED ORDER — LIDOCAINE 3.5 % OP GEL OPTIME - NO CHARGE
OPHTHALMIC | Status: DC | PRN
  Administered 2011-08-04: 1 [drp] via OPHTHALMIC

## 2011-08-04 MED ORDER — NEOMYCIN-POLYMYXIN-DEXAMETH 0.1 % OP OINT
TOPICAL_OINTMENT | OPHTHALMIC | Status: DC | PRN
  Administered 2011-08-04: 1 via OPHTHALMIC

## 2011-08-04 MED ORDER — LIDOCAINE HCL (PF) 1 % IJ SOLN
INTRAOCULAR | Status: DC | PRN
  Administered 2011-08-04: 13:00:00 via OPHTHALMIC

## 2011-08-04 MED ORDER — LIDOCAINE HCL 3.5 % OP GEL
1.0000 "application " | Freq: Once | OPHTHALMIC | Status: AC
  Administered 2011-08-04: 1 via OPHTHALMIC

## 2011-08-04 MED ORDER — EPINEPHRINE HCL 1 MG/ML IJ SOLN
INTRAOCULAR | Status: DC | PRN
  Administered 2011-08-04: 13:00:00

## 2011-08-04 SURGICAL SUPPLY — 34 items
CAPSULAR TENSION RING-AMO (OPHTHALMIC RELATED) IMPLANT
CLOTH BEACON ORANGE TIMEOUT ST (SAFETY) ×1 IMPLANT
EYE SHIELD UNIVERSAL CLEAR (GAUZE/BANDAGES/DRESSINGS) ×1 IMPLANT
GLOVE BIO SURGEON STRL SZ 6.5 (GLOVE) IMPLANT
GLOVE BIOGEL PI IND STRL 6.5 (GLOVE) IMPLANT
GLOVE BIOGEL PI IND STRL 7.0 (GLOVE) IMPLANT
GLOVE BIOGEL PI IND STRL 7.5 (GLOVE) IMPLANT
GLOVE BIOGEL PI INDICATOR 6.5 (GLOVE) ×1
GLOVE BIOGEL PI INDICATOR 7.0 (GLOVE)
GLOVE BIOGEL PI INDICATOR 7.5 (GLOVE)
GLOVE ECLIPSE 6.5 STRL STRAW (GLOVE) IMPLANT
GLOVE ECLIPSE 7.0 STRL STRAW (GLOVE) IMPLANT
GLOVE ECLIPSE 7.5 STRL STRAW (GLOVE) IMPLANT
GLOVE EXAM NITRILE LRG STRL (GLOVE) IMPLANT
GLOVE EXAM NITRILE MD LF STRL (GLOVE) ×1 IMPLANT
GLOVE SKINSENSE NS SZ6.5 (GLOVE)
GLOVE SKINSENSE NS SZ7.0 (GLOVE)
GLOVE SKINSENSE NS SZ7.5 (GLOVE) ×1
GLOVE SKINSENSE STRL SZ6.5 (GLOVE) IMPLANT
GLOVE SKINSENSE STRL SZ7.0 (GLOVE) IMPLANT
GLOVE SKINSENSE STRL SZ7.5 (GLOVE) IMPLANT
KIT VITRECTOMY (OPHTHALMIC RELATED) IMPLANT
PAD ARMBOARD 7.5X6 YLW CONV (MISCELLANEOUS) ×1 IMPLANT
PROC W NO LENS (INTRAOCULAR LENS)
PROC W SPEC LENS (INTRAOCULAR LENS)
PROCESS W NO LENS (INTRAOCULAR LENS) IMPLANT
PROCESS W SPEC LENS (INTRAOCULAR LENS) IMPLANT
RING MALYGIN (MISCELLANEOUS) IMPLANT
SIGHTPATH CAT PROC W REG LENS (Ophthalmic Related) ×2 IMPLANT
SYR TB 1ML LL NO SAFETY (SYRINGE) ×1 IMPLANT
TAPE SURG TRANSPORE 1 IN (GAUZE/BANDAGES/DRESSINGS) IMPLANT
TAPE SURGICAL TRANSPORE 1 IN (GAUZE/BANDAGES/DRESSINGS) ×1
VISCOELASTIC ADDITIONAL (OPHTHALMIC RELATED) IMPLANT
WATER STERILE IRR 250ML POUR (IV SOLUTION) ×1 IMPLANT

## 2011-08-04 NOTE — Brief Op Note (Signed)
Pre-Op Dx: Cataract OD Post-Op Dx: Cataract OD Surgeon: Shiesha Jahn Anesthesia: Topical with MAC Implant: Lenstec, Model Softec HD Blood Loss: None Specimen: None Complications: None 

## 2011-08-04 NOTE — Preoperative (Signed)
Beta Blockers   Reason not to administer Beta Blockers:Not Applicable 

## 2011-08-04 NOTE — H&P (Signed)
I have reviewed the H&P, the patient was re-examined, and I have identified no interval changes in medical condition and plan of care since the history and physical of record  

## 2011-08-04 NOTE — Anesthesia Postprocedure Evaluation (Signed)
  Anesthesia Post-op Note  Patient: [name]  Procedure(s) Performed:  CATARACT EXTRACTION PHACO AND INTRAOCULAR LENS PLACEMENT (IOC) - CDE=18.53  Patient Location: PACU and Short Stay  Anesthesia Type: MAC  Level of Consciousness: awake, alert  and oriented  Airway and Oxygen Therapy: Patient Spontanous Breathing  Post-op Pain: none  Post-op Assessment: Post-op Vital signs reviewed, Patient's Cardiovascular Status Stable, Respiratory Function Stable and No signs of Nausea or vomiting  Post-op Vital Signs: Reviewed and stable  Complications: No apparent anesthesia complications

## 2011-08-04 NOTE — Anesthesia Preprocedure Evaluation (Addendum)
Anesthesia Evaluation  Patient identified by MRN, date of birth, ID band Patient awake    Reviewed: Allergy & Precautions, H&P , NPO status , Patient's Chart, lab work & pertinent test results, reviewed documented beta blocker date and time   Airway Mallampati: II      Dental  (+) Edentulous Upper and Edentulous Lower   Pulmonary neg pulmonary ROS,    Pulmonary exam normal       Cardiovascular hypertension, + Past MI Regular Normal    Neuro/Psych PSYCHIATRIC DISORDERS Anxiety Depression    GI/Hepatic GERD-  ,  Endo/Other    Renal/GU      Musculoskeletal   Abdominal   Peds  Hematology   Anesthesia Other Findings   Reproductive/Obstetrics                           Anesthesia Physical Anesthesia Plan  ASA: III  Anesthesia Plan: MAC   Post-op Pain Management:    Induction:   Airway Management Planned: Nasal Cannula  Additional Equipment:   Intra-op Plan:   Post-operative Plan:   Informed Consent: I have reviewed the patients History and Physical, chart, labs and discussed the procedure including the risks, benefits and alternatives for the proposed anesthesia with the patient or authorized representative who has indicated his/her understanding and acceptance.     Plan Discussed with:   Anesthesia Plan Comments:        Anesthesia Quick Evaluation

## 2011-08-04 NOTE — Transfer of Care (Signed)
Immediate Anesthesia Transfer of Care Note  Patient: [name]  Procedure(s) Performed:  CATARACT EXTRACTION PHACO AND INTRAOCULAR LENS PLACEMENT (IOC) - CDE=18.53  Patient Location: PACU and Short Stay  Anesthesia Type: MAC  Level of Consciousness: awake, alert  and oriented  Airway & Oxygen Therapy: Patient Spontanous Breathing  Post-op Assessment: Report given to PACU RN  Post vital signs: Reviewed and stable  Complications: No apparent anesthesia complications

## 2011-08-05 NOTE — Op Note (Signed)
NAMEKAMORA, VOSSLER             ACCOUNT NO.:  [id number]  MEDICAL RECORD NO.:  [id number]  LOCATION:  APPO                          FACILITY:  [facility]  PHYSICIAN:  Susanne Greenhouse, MD       DATE OF BIRTH:  1922/06/05  DATE OF PROCEDURE:  08/04/2011 DATE OF DISCHARGE:  08/04/2011                              OPERATIVE REPORT   PREOPERATIVE DIAGNOSIS:  Combined cataract, right eye, diagnosis code 366.19.  POSTOPERATIVE DIAGNOSIS:  Combined cataract, right eye, diagnosis code 366.19.  OPERATION PERFORMED:  Phacoemulsification with posterior chamber intraocular lens implantation, right eye.  SURGEON:  Bonne Dolores. Chantrice Hagg, MD.  ANESTHESIA:  General endotracheal anesthesia.  OPERATIVE SUMMARY:  In the preoperative area, dilating drops were placed into the right eye.  The patient was then brought into the operating room where she was placed under general anesthesia.  The eye was then prepped and draped.  Beginning with a 75 blade, a paracentesis port was made at the surgeon's 2 o'clock position.  The anterior chamber was then filled with a 1% nonpreserved lidocaine solution with epinephrine.  This was followed by Viscoat to deepen the chamber.  A small fornix-based peritomy was performed superiorly.  Next, a single iris hook was placed through the limbus superiorly.  A 2.4-mm keratome blade was then used to make a clear corneal incision over the iris hook.  A bent cystotome needle and Utrata forceps were used to create a continuous tear capsulotomy.  Hydrodissection was performed using balanced salt solution on a fine cannula.  The lens nucleus was then removed using phacoemulsification in a quadrant cracking technique.  The cortical material was then removed with irrigation and aspiration.  The capsular bag and anterior chamber were refilled with Provisc.  The wound was widened to approximately 3 mm and a posterior chamber intraocular lens was placed into the capsular bag without difficulty  using an [employer] lens injecting system.  A single 10-0 nylon suture was then used to close the incision as well as stromal hydration.  The Provisc was removed from the anterior chamber and capsular bag with irrigation and aspiration.  At this point, the wounds were tested for leak, which were negative.  The anterior chamber remained deep and stable.  The patient tolerated the procedure well.  There were no operative complications, and she awoke from general anesthesia without problem.  No surgical specimens.  Prosthetic device used is a Lenstec posterior chamber lens, model Softec HD, power of 17.5, serial number is [id number].          ______________________________ Susanne Greenhouse, MD     KEH/MEDQ  D:  08/04/2011  T:  08/05/2011  Job:  [id number]

## 2011-08-08 LAB — GLUCOSE, CAPILLARY: Glucose-Capillary: 142 mg/dL — ABNORMAL HIGH (ref 70–99)

## 2011-08-10 LAB — GLUCOSE, CAPILLARY: Glucose-Capillary: 90 mg/dL (ref 70–99)

## 2011-08-11 ENCOUNTER — Encounter (HOSPITAL_COMMUNITY): Payer: Self-pay | Admitting: Ophthalmology

## 2011-08-12 LAB — GLUCOSE, CAPILLARY: Glucose-Capillary: 141 mg/dL — ABNORMAL HIGH (ref 70–99)

## 2011-08-17 LAB — GLUCOSE, CAPILLARY: Glucose-Capillary: 98 mg/dL (ref 70–99)

## 2011-08-19 ENCOUNTER — Encounter (HOSPITAL_COMMUNITY): Payer: Medicare Other

## 2011-08-19 MED ORDER — SODIUM CHLORIDE 0.9 % IV SOLN: 4.0000 mg | Freq: Once | INTRAVENOUS | Status: DC | PRN

## 2011-08-19 MED ORDER — FENTANYL CITRATE 0.05 MG/ML IJ SOLN: 25.0000 ug | INTRAMUSCULAR | Status: DC | PRN

## 2011-08-22 LAB — GLUCOSE, CAPILLARY: Glucose-Capillary: 89 mg/dL (ref 70–99)

## 2011-08-24 LAB — GLUCOSE, CAPILLARY: Glucose-Capillary: 103 mg/dL — ABNORMAL HIGH (ref 70–99)

## 2011-08-25 ENCOUNTER — Encounter (HOSPITAL_COMMUNITY): Payer: Self-pay | Admitting: Anesthesiology

## 2011-08-25 ENCOUNTER — Ambulatory Visit (HOSPITAL_COMMUNITY): Payer: Medicare Other | Admitting: Anesthesiology

## 2011-08-25 ENCOUNTER — Inpatient Hospital Stay
Admission: RE | Admit: 2011-08-25 | Discharge: 2011-11-15 | Disposition: A | Discharge status: Nursing Home (Includes ICF) | Payer: Medicaid Other | Source: Ambulatory Visit | Attending: Internal Medicine | Admitting: Internal Medicine

## 2011-08-25 ENCOUNTER — Other Ambulatory Visit (HOSPITAL_COMMUNITY): Payer: Self-pay | Admitting: Ophthalmology

## 2011-08-25 ENCOUNTER — Encounter (HOSPITAL_COMMUNITY)
Admission: RE | Disposition: A | Discharge status: Home / Self Care | Payer: Self-pay | Source: Ambulatory Visit | Attending: Ophthalmology

## 2011-08-25 ENCOUNTER — Encounter (HOSPITAL_COMMUNITY): Payer: Self-pay | Admitting: Ophthalmology

## 2011-08-25 ENCOUNTER — Encounter (HOSPITAL_COMMUNITY): Payer: Self-pay | Admitting: *Deleted

## 2011-08-25 ENCOUNTER — Ambulatory Visit (HOSPITAL_COMMUNITY)
Admission: RE | Admit: 2011-08-25 | Discharge: 2011-08-25 | Disposition: A | Discharge status: Home / Self Care | Payer: Medicare Other | Source: Ambulatory Visit | Attending: Ophthalmology | Admitting: Ophthalmology

## 2011-08-25 DIAGNOSIS — H2589 Other age-related cataract: Secondary | ICD-10-CM | POA: Insufficient documentation

## 2011-08-25 DIAGNOSIS — H4011X0 Primary open-angle glaucoma, stage unspecified: Secondary | ICD-10-CM | POA: Insufficient documentation

## 2011-08-25 DIAGNOSIS — Z01812 Encounter for preprocedural laboratory examination: Secondary | ICD-10-CM | POA: Insufficient documentation

## 2011-08-25 DIAGNOSIS — Z79899 Other long term (current) drug therapy: Secondary | ICD-10-CM | POA: Insufficient documentation

## 2011-08-25 DIAGNOSIS — I10 Essential (primary) hypertension: Secondary | ICD-10-CM | POA: Insufficient documentation

## 2011-08-25 HISTORY — PX: CATARACT EXTRACTION W/PHACO: SHX586

## 2011-08-25 LAB — GLUCOSE, CAPILLARY: Glucose-Capillary: 83 mg/dL (ref 70–99)

## 2011-08-25 SURGERY — PHACOEMULSIFICATION, CATARACT, WITH IOL INSERTION
Anesthesia: Monitor Anesthesia Care | Site: Eye | Laterality: Left | Wound class: Clean

## 2011-08-25 MED ORDER — TETRACAINE HCL 0.5 % OP SOLN
OPHTHALMIC | Status: AC
  Administered 2011-08-25: 1 [drp] via OPHTHALMIC
  Filled 2011-08-25: qty 2

## 2011-08-25 MED ORDER — POVIDONE-IODINE 5 % OP SOLN
OPHTHALMIC | Status: DC | PRN
  Administered 2011-08-25: 1 via OPHTHALMIC

## 2011-08-25 MED ORDER — EPINEPHRINE HCL 1 MG/ML IJ SOLN
INTRAOCULAR | Status: DC | PRN
  Administered 2011-08-25: 14:00:00

## 2011-08-25 MED ORDER — MIDAZOLAM HCL 2 MG/2ML IJ SOLN
INTRAMUSCULAR | Status: AC
  Administered 2011-08-25: 2 mg via INTRAVENOUS
  Filled 2011-08-25: qty 2

## 2011-08-25 MED ORDER — NEOMYCIN-POLYMYXIN-DEXAMETH 0.1 % OP OINT
TOPICAL_OINTMENT | OPHTHALMIC | Status: DC | PRN
  Administered 2011-08-25: 1 via OPHTHALMIC

## 2011-08-25 MED ORDER — FENTANYL CITRATE 0.05 MG/ML IJ SOLN: 25.0000 ug | INTRAMUSCULAR | Status: DC | PRN

## 2011-08-25 MED ORDER — BSS IO SOLN
INTRAOCULAR | Status: DC | PRN
  Administered 2011-08-25: 15 mL via INTRAOCULAR

## 2011-08-25 MED ORDER — LACTATED RINGERS IV SOLN
INTRAVENOUS | Status: DC | PRN
  Administered 2011-08-25: 13:00:00 via INTRAVENOUS

## 2011-08-25 MED ORDER — PHENYLEPHRINE HCL 2.5 % OP SOLN
OPHTHALMIC | Status: AC
  Administered 2011-08-25: 1 [drp] via OPHTHALMIC
  Filled 2011-08-25: qty 2

## 2011-08-25 MED ORDER — ACETAZOLAMIDE ER 500 MG PO CP12: 500.0000 mg | ORAL_CAPSULE | Freq: Once | ORAL | Status: AC

## 2011-08-25 MED ORDER — CYCLOPENTOLATE-PHENYLEPHRINE 0.2-1 % OP SOLN
OPHTHALMIC | Status: AC
  Administered 2011-08-25: 1 [drp] via OPHTHALMIC
  Filled 2011-08-25: qty 2

## 2011-08-25 MED ORDER — LIDOCAINE HCL 3.5 % OP GEL
OPHTHALMIC | Status: AC
  Administered 2011-08-25: 13:00:00
  Filled 2011-08-25: qty 5

## 2011-08-25 MED ORDER — ACETAZOLAMIDE 250 MG PO TABS
500.0000 mg | ORAL_TABLET | Freq: Two times a day (BID) | ORAL | Status: DC
  Administered 2011-08-25: 250 mg via ORAL

## 2011-08-25 MED ORDER — PHENYLEPHRINE HCL 2.5 % OP SOLN
1.0000 [drp] | OPHTHALMIC | Status: AC
  Administered 2011-08-25 (×3): 1 [drp] via OPHTHALMIC

## 2011-08-25 MED ORDER — MIDAZOLAM HCL 2 MG/2ML IJ SOLN
1.0000 mg | INTRAMUSCULAR | Status: DC | PRN
  Administered 2011-08-25: 2 mg via INTRAVENOUS

## 2011-08-25 MED ORDER — LIDOCAINE 3.5 % OP GEL OPTIME - NO CHARGE
OPHTHALMIC | Status: DC | PRN
  Administered 2011-08-25: 1 [drp] via OPHTHALMIC

## 2011-08-25 MED ORDER — CYCLOPENTOLATE-PHENYLEPHRINE 0.2-1 % OP SOLN
1.0000 [drp] | OPHTHALMIC | Status: AC
  Administered 2011-08-25 (×3): 1 [drp] via OPHTHALMIC

## 2011-08-25 MED ORDER — LACTATED RINGERS IV SOLN
INTRAVENOUS | Status: DC
  Administered 2011-08-25: 1000 mL via INTRAVENOUS

## 2011-08-25 MED ORDER — TETRACAINE HCL 0.5 % OP SOLN
1.0000 [drp] | OPHTHALMIC | Status: AC
  Administered 2011-08-25 (×3): 1 [drp] via OPHTHALMIC

## 2011-08-25 MED ORDER — EPINEPHRINE HCL 1 MG/ML IJ SOLN
INTRAMUSCULAR | Status: AC
  Filled 2011-08-25: qty 1

## 2011-08-25 MED ORDER — ONDANSETRON HCL 4 MG/2ML IJ SOLN: 4.0000 mg | Freq: Once | INTRAMUSCULAR | Status: DC | PRN

## 2011-08-25 MED ORDER — ACETAZOLAMIDE 250 MG PO TABS
ORAL_TABLET | ORAL | Status: AC
  Administered 2011-08-25: 250 mg via ORAL
  Filled 2011-08-25: qty 1

## 2011-08-25 MED ORDER — NEOMYCIN-POLYMYXIN-DEXAMETH 3.5-10000-0.1 OP OINT
TOPICAL_OINTMENT | OPHTHALMIC | Status: AC
  Filled 2011-08-25: qty 3.5

## 2011-08-25 MED ORDER — PROVISC 10 MG/ML IO SOLN
INTRAOCULAR | Status: DC | PRN
  Administered 2011-08-25: 8.5 mg via INTRAOCULAR

## 2011-08-25 MED ORDER — LIDOCAINE HCL (PF) 1 % IJ SOLN
INTRAMUSCULAR | Status: DC | PRN
  Administered 2011-08-25: .6 mL

## 2011-08-25 MED ORDER — LIDOCAINE HCL (PF) 1 % IJ SOLN
INTRAMUSCULAR | Status: AC
  Filled 2011-08-25: qty 2

## 2011-08-25 SURGICAL SUPPLY — 32 items
CAPSULAR TENSION RING-AMO (OPHTHALMIC RELATED) IMPLANT
CLOTH BEACON ORANGE TIMEOUT ST (SAFETY) ×2 IMPLANT
EYE SHIELD UNIVERSAL CLEAR (GAUZE/BANDAGES/DRESSINGS) ×1 IMPLANT
GLOVE BIO SURGEON STRL SZ 6.5 (GLOVE) IMPLANT
GLOVE BIOGEL PI IND STRL 6.5 (GLOVE) IMPLANT
GLOVE BIOGEL PI IND STRL 7.0 (GLOVE) IMPLANT
GLOVE BIOGEL PI IND STRL 7.5 (GLOVE) IMPLANT
GLOVE BIOGEL PI INDICATOR 6.5 (GLOVE)
GLOVE BIOGEL PI INDICATOR 7.0 (GLOVE) ×1
GLOVE BIOGEL PI INDICATOR 7.5 (GLOVE)
GLOVE ECLIPSE 6.5 STRL STRAW (GLOVE) IMPLANT
GLOVE ECLIPSE 7.0 STRL STRAW (GLOVE) IMPLANT
GLOVE ECLIPSE 7.5 STRL STRAW (GLOVE) IMPLANT
GLOVE EXAM NITRILE LRG STRL (GLOVE) IMPLANT
GLOVE EXAM NITRILE MD LF STRL (GLOVE) ×1 IMPLANT
GLOVE SKINSENSE NS SZ6.5 (GLOVE)
GLOVE SKINSENSE NS SZ7.0 (GLOVE)
GLOVE SKINSENSE STRL SZ6.5 (GLOVE) IMPLANT
GLOVE SKINSENSE STRL SZ7.0 (GLOVE) IMPLANT
KIT VITRECTOMY (OPHTHALMIC RELATED) IMPLANT
PAD ARMBOARD 7.5X6 YLW CONV (MISCELLANEOUS) ×1 IMPLANT
PROC W NO LENS (INTRAOCULAR LENS)
PROC W SPEC LENS (INTRAOCULAR LENS)
PROCESS W NO LENS (INTRAOCULAR LENS) IMPLANT
PROCESS W SPEC LENS (INTRAOCULAR LENS) IMPLANT
RING MALYGIN (MISCELLANEOUS) IMPLANT
SIGHTPATH CAT PROC W REG LENS (Ophthalmic Related) ×2 IMPLANT
SYR TB 1ML LL NO SAFETY (SYRINGE) ×2 IMPLANT
TAPE SURG TRANSPORE 1 IN (GAUZE/BANDAGES/DRESSINGS) IMPLANT
TAPE SURGICAL TRANSPORE 1 IN (GAUZE/BANDAGES/DRESSINGS) ×1
VISCOELASTIC ADDITIONAL (OPHTHALMIC RELATED) IMPLANT
WATER STERILE IRR 250ML POUR (IV SOLUTION) ×1 IMPLANT

## 2011-08-25 NOTE — Transfer of Care (Signed)
Immediate Anesthesia Transfer of Care Note  Patient: [name]  Procedure(s) Performed:  CATARACT EXTRACTION PHACO AND INTRAOCULAR LENS PLACEMENT (IOC) - CDE:14.76  Patient Location: short stay  Anesthesia Type: MAC  Level of Consciousness: awake, alert , oriented and patient cooperative  Airway & Oxygen Therapy: Patient Spontanous Breathing  Post-op Assessment: Report given to PACU RN and Post -op Vital signs reviewed and stable  Post vital signs: Reviewed and stable  Complications: No apparent anesthesia complications

## 2011-08-25 NOTE — Preoperative (Signed)
Beta Blockers   Reason not to administer Beta Blockers:Not Applicable 

## 2011-08-25 NOTE — Anesthesia Postprocedure Evaluation (Signed)
  Anesthesia Post-op Note  Patient: [name]  Procedure(s) Performed:  CATARACT EXTRACTION PHACO AND INTRAOCULAR LENS PLACEMENT (IOC) - CDE:14.76  Patient Location: shortstay  Anesthesia Type: MAC  Level of Consciousness: awake, alert  and oriented  Airway and Oxygen Therapy: Patient Spontanous Breathing  Post-op Pain: none  Post-op Assessment: Post-op Vital signs reviewed, Patient's Cardiovascular Status Stable, Respiratory Function Stable, Patent Airway and No signs of Nausea or vomiting  Post-op Vital Signs: Reviewed and stable  Complications: No apparent anesthesia complications

## 2011-08-25 NOTE — Anesthesia Preprocedure Evaluation (Signed)
Anesthesia Evaluation  Patient identified by MRN, date of birth, ID band Patient awake    Reviewed: Allergy & Precautions, H&P , NPO status , Patient's Chart, lab work & pertinent test results, reviewed documented beta blocker date and time   History of Anesthesia Complications Negative for: history of anesthetic complications  Airway Mallampati: II      Dental  (+) Edentulous Upper and Edentulous Lower   Pulmonary neg pulmonary ROS,    Pulmonary exam normal       Cardiovascular hypertension, + Past MI Regular Normal    Neuro/Psych PSYCHIATRIC DISORDERS Anxiety Depression    GI/Hepatic GERD-  ,  Endo/Other    Renal/GU      Musculoskeletal   Abdominal   Peds  Hematology   Anesthesia Other Findings   Reproductive/Obstetrics                           Anesthesia Physical Anesthesia Plan  ASA: III  Anesthesia Plan: MAC   Post-op Pain Management:    Induction: Intravenous  Airway Management Planned: Nasal Cannula  Additional Equipment:   Intra-op Plan:   Post-operative Plan:   Informed Consent: I have reviewed the patients History and Physical, chart, labs and discussed the procedure including the risks, benefits and alternatives for the proposed anesthesia with the patient or authorized representative who has indicated his/her understanding and acceptance.     Plan Discussed with:   Anesthesia Plan Comments:         Anesthesia Quick Evaluation

## 2011-08-25 NOTE — H&P (Signed)
I have reviewed the H&P, the patient was re-examined, and I have identified no interval changes in medical condition and plan of care since the history and physical of record  

## 2011-08-25 NOTE — Anesthesia Procedure Notes (Addendum)
Procedure Name: MAC Date/Time: 08/25/2011 1:49 PM Performed by: Carolyne Littles, Teyla Skidgel Pre-anesthesia Checklist: Patient identified, Patient being monitored, Emergency Drugs available, Timeout performed and Suction available Oxygen Delivery Method: Nasal Cannula

## 2011-08-26 NOTE — Op Note (Signed)
NAMEGIANELLA, CHISMAR             ACCOUNT NO.:  [id number]  MEDICAL RECORD NO.:  [id number]  LOCATION:  [facility]                          FACILITY:  [facility]  PHYSICIAN:  Susanne Greenhouse, MD       DATE OF BIRTH:  1922-06-25  DATE OF PROCEDURE:  08/25/2011 DATE OF DISCHARGE:                              OPERATIVE REPORT   PREOPERATIVE DIAGNOSES:  Combined cataract, left eye, diagnosis code 366.19 and open angle glaucoma, diagnosis code 365.11.  POSTOPERATIVE DIAGNOSES:  Combined cataract, left eye, diagnosis code 366.19 and open angle glaucoma, diagnosis code 365.11.  OPERATION PERFORMED:  Phacoemulsification with posterior chamber intraocular lens implantation, left eye.  SURGEON:  Bonne Dolores. Herma Uballe, MD  ANESTHESIA:  General endotracheal anesthesia.  OPERATIVE SUMMARY:  In the preoperative area, dilating drops were placed into the left eye.  The patient was then brought into the operating room where he was placed under general anesthesia.  The eye was then prepped and draped.  Beginning with a 75 blade, a paracentesis port was made at the surgeon's 2 o'clock position.  The anterior chamber was then filled with a 1% nonpreserved lidocaine solution with epinephrine.  This was followed by Viscoat to deepen the chamber.  A small fornix-based peritomy was performed superiorly.  Next, a single iris hook was placed through the limbus superiorly.  A 2.4-mm keratome blade was then used to make a clear corneal incision over the iris hook.  A bent cystotome needle and Utrata forceps were used to create a continuous tear capsulotomy.  Hydrodissection was performed using balanced salt solution on a fine cannula.  The lens nucleus was then removed using phacoemulsification in a quadrant cracking technique.  The cortical material was then removed with irrigation and aspiration.  The capsular bag and anterior chamber were refilled with Provisc.  The wound was widened to approximately 3 mm and a posterior  chamber intraocular lens was placed into the capsular bag without difficulty using an [employer] lens injecting system.  A single 10-0 nylon suture was then used to close the incision as well as stromal hydration.  The Provisc was removed from the anterior chamber and capsular bag with irrigation and aspiration.  At this point, the wounds were tested for leak, which were negative.  The anterior chamber remained deep and stable.  The patient tolerated the procedure well.  There were no operative complications, and he awoke from general anesthesia without problem.  No surgical specimens.  Prosthetic device used is a Lenstec posterior chamber lens, model Softec HD, power of 17.5, serial number is [id number].          ______________________________ Susanne Greenhouse, MD     KEH/MEDQ  D:  08/25/2011  T:  08/26/2011  Job:  [id number]

## 2011-08-28 ENCOUNTER — Other Ambulatory Visit (HOSPITAL_COMMUNITY): Payer: Self-pay

## 2011-08-28 ENCOUNTER — Ambulatory Visit (HOSPITAL_COMMUNITY)
Admission: RE | Admit: 2011-08-28 | Discharge: 2011-08-28 | Disposition: A | Discharge status: Home / Self Care | Payer: Medicare Other | Source: Ambulatory Visit | Attending: Internal Medicine | Admitting: Internal Medicine

## 2011-08-28 DIAGNOSIS — R059 Cough, unspecified: Secondary | ICD-10-CM

## 2011-08-28 DIAGNOSIS — R05 Cough: Secondary | ICD-10-CM | POA: Insufficient documentation

## 2011-08-28 DIAGNOSIS — R0989 Other specified symptoms and signs involving the circulatory and respiratory systems: Secondary | ICD-10-CM | POA: Insufficient documentation

## 2011-08-30 DIAGNOSIS — R262 Difficulty in walking, not elsewhere classified: Secondary | ICD-10-CM | POA: Diagnosis not present

## 2011-08-30 DIAGNOSIS — M6281 Muscle weakness (generalized): Secondary | ICD-10-CM | POA: Diagnosis not present

## 2011-08-30 DIAGNOSIS — M159 Polyosteoarthritis, unspecified: Secondary | ICD-10-CM | POA: Diagnosis not present

## 2011-08-30 DIAGNOSIS — Z9181 History of falling: Secondary | ICD-10-CM | POA: Diagnosis not present

## 2011-08-31 ENCOUNTER — Encounter (HOSPITAL_COMMUNITY): Payer: Self-pay | Admitting: Ophthalmology

## 2011-08-31 DIAGNOSIS — Z9181 History of falling: Secondary | ICD-10-CM | POA: Diagnosis not present

## 2011-08-31 DIAGNOSIS — E785 Hyperlipidemia, unspecified: Secondary | ICD-10-CM | POA: Diagnosis not present

## 2011-08-31 DIAGNOSIS — M159 Polyosteoarthritis, unspecified: Secondary | ICD-10-CM | POA: Diagnosis not present

## 2011-08-31 DIAGNOSIS — I10 Essential (primary) hypertension: Secondary | ICD-10-CM | POA: Diagnosis not present

## 2011-08-31 DIAGNOSIS — M6281 Muscle weakness (generalized): Secondary | ICD-10-CM | POA: Diagnosis not present

## 2011-08-31 DIAGNOSIS — R262 Difficulty in walking, not elsewhere classified: Secondary | ICD-10-CM | POA: Diagnosis not present

## 2011-09-01 DIAGNOSIS — M159 Polyosteoarthritis, unspecified: Secondary | ICD-10-CM | POA: Diagnosis not present

## 2011-09-01 DIAGNOSIS — Z9181 History of falling: Secondary | ICD-10-CM | POA: Diagnosis not present

## 2011-09-01 DIAGNOSIS — R262 Difficulty in walking, not elsewhere classified: Secondary | ICD-10-CM | POA: Diagnosis not present

## 2011-09-01 DIAGNOSIS — M6281 Muscle weakness (generalized): Secondary | ICD-10-CM | POA: Diagnosis not present

## 2011-09-02 DIAGNOSIS — M6281 Muscle weakness (generalized): Secondary | ICD-10-CM | POA: Diagnosis not present

## 2011-09-02 DIAGNOSIS — M159 Polyosteoarthritis, unspecified: Secondary | ICD-10-CM | POA: Diagnosis not present

## 2011-09-02 DIAGNOSIS — Z9181 History of falling: Secondary | ICD-10-CM | POA: Diagnosis not present

## 2011-09-02 DIAGNOSIS — R262 Difficulty in walking, not elsewhere classified: Secondary | ICD-10-CM | POA: Diagnosis not present

## 2011-09-02 LAB — GLUCOSE, CAPILLARY: Glucose-Capillary: 103 mg/dL — ABNORMAL HIGH (ref 70–99)

## 2011-09-05 DIAGNOSIS — M6281 Muscle weakness (generalized): Secondary | ICD-10-CM | POA: Diagnosis not present

## 2011-09-05 DIAGNOSIS — R262 Difficulty in walking, not elsewhere classified: Secondary | ICD-10-CM | POA: Diagnosis not present

## 2011-09-05 DIAGNOSIS — M159 Polyosteoarthritis, unspecified: Secondary | ICD-10-CM | POA: Diagnosis not present

## 2011-09-05 DIAGNOSIS — Z9181 History of falling: Secondary | ICD-10-CM | POA: Diagnosis not present

## 2011-09-05 LAB — GLUCOSE, CAPILLARY: Glucose-Capillary: 92 mg/dL (ref 70–99)

## 2011-09-06 DIAGNOSIS — R262 Difficulty in walking, not elsewhere classified: Secondary | ICD-10-CM | POA: Diagnosis not present

## 2011-09-06 DIAGNOSIS — M6281 Muscle weakness (generalized): Secondary | ICD-10-CM | POA: Diagnosis not present

## 2011-09-06 DIAGNOSIS — M159 Polyosteoarthritis, unspecified: Secondary | ICD-10-CM | POA: Diagnosis not present

## 2011-09-06 DIAGNOSIS — Z9181 History of falling: Secondary | ICD-10-CM | POA: Diagnosis not present

## 2011-09-07 DIAGNOSIS — M6281 Muscle weakness (generalized): Secondary | ICD-10-CM | POA: Diagnosis not present

## 2011-09-07 DIAGNOSIS — M159 Polyosteoarthritis, unspecified: Secondary | ICD-10-CM | POA: Diagnosis not present

## 2011-09-07 DIAGNOSIS — R262 Difficulty in walking, not elsewhere classified: Secondary | ICD-10-CM | POA: Diagnosis not present

## 2011-09-07 DIAGNOSIS — Z9181 History of falling: Secondary | ICD-10-CM | POA: Diagnosis not present

## 2011-09-09 DIAGNOSIS — F411 Generalized anxiety disorder: Secondary | ICD-10-CM | POA: Diagnosis not present

## 2011-09-09 LAB — GLUCOSE, CAPILLARY: Glucose-Capillary: 104 mg/dL — ABNORMAL HIGH (ref 70–99)

## 2011-09-12 DIAGNOSIS — I10 Essential (primary) hypertension: Secondary | ICD-10-CM | POA: Diagnosis not present

## 2011-09-14 DIAGNOSIS — B370 Candidal stomatitis: Secondary | ICD-10-CM | POA: Diagnosis not present

## 2011-09-14 LAB — GLUCOSE, CAPILLARY

## 2011-09-16 LAB — GLUCOSE, CAPILLARY: Glucose-Capillary: 92 mg/dL (ref 70–99)

## 2011-09-19 LAB — GLUCOSE, CAPILLARY: Glucose-Capillary: 101 mg/dL — ABNORMAL HIGH (ref 70–99)

## 2011-09-23 LAB — GLUCOSE, CAPILLARY: Glucose-Capillary: 94 mg/dL (ref 70–99)

## 2011-09-26 LAB — GLUCOSE, CAPILLARY: Glucose-Capillary: 110 mg/dL — ABNORMAL HIGH (ref 70–99)

## 2011-09-28 LAB — GLUCOSE, CAPILLARY: Glucose-Capillary: 88 mg/dL (ref 70–99)

## 2011-09-30 LAB — GLUCOSE, CAPILLARY: Glucose-Capillary: 105 mg/dL — ABNORMAL HIGH (ref 70–99)

## 2011-10-05 DIAGNOSIS — F0390 Unspecified dementia without behavioral disturbance: Secondary | ICD-10-CM | POA: Diagnosis not present

## 2011-10-05 DIAGNOSIS — N2881 Hypertrophy of kidney: Secondary | ICD-10-CM | POA: Diagnosis not present

## 2011-10-07 LAB — GLUCOSE, CAPILLARY: Glucose-Capillary: 100 mg/dL — ABNORMAL HIGH (ref 70–99)

## 2011-10-17 LAB — GLUCOSE, CAPILLARY: Glucose-Capillary: 95 mg/dL (ref 70–99)

## 2011-10-18 DIAGNOSIS — F39 Unspecified mood [affective] disorder: Secondary | ICD-10-CM | POA: Diagnosis not present

## 2011-10-18 DIAGNOSIS — F411 Generalized anxiety disorder: Secondary | ICD-10-CM | POA: Diagnosis not present

## 2011-10-18 DIAGNOSIS — F03918 Unspecified dementia, unspecified severity, with other behavioral disturbance: Secondary | ICD-10-CM | POA: Diagnosis not present

## 2011-10-18 DIAGNOSIS — F329 Major depressive disorder, single episode, unspecified: Secondary | ICD-10-CM | POA: Diagnosis not present

## 2011-10-18 DIAGNOSIS — F3289 Other specified depressive episodes: Secondary | ICD-10-CM | POA: Diagnosis not present

## 2011-10-18 DIAGNOSIS — F0391 Unspecified dementia with behavioral disturbance: Secondary | ICD-10-CM | POA: Diagnosis not present

## 2011-10-24 DIAGNOSIS — Z79899 Other long term (current) drug therapy: Secondary | ICD-10-CM | POA: Diagnosis not present

## 2011-10-24 LAB — GLUCOSE, CAPILLARY: Glucose-Capillary: 114 mg/dL — ABNORMAL HIGH (ref 70–99)

## 2011-10-26 LAB — GLUCOSE, CAPILLARY: Glucose-Capillary: 95 mg/dL (ref 70–99)

## 2011-11-02 LAB — GLUCOSE, CAPILLARY: Glucose-Capillary: 87 mg/dL (ref 70–99)

## 2011-11-03 DIAGNOSIS — H409 Unspecified glaucoma: Secondary | ICD-10-CM | POA: Diagnosis not present

## 2011-11-03 DIAGNOSIS — H4011X0 Primary open-angle glaucoma, stage unspecified: Secondary | ICD-10-CM | POA: Diagnosis not present

## 2011-11-07 LAB — GLUCOSE, CAPILLARY: Glucose-Capillary: 98 mg/dL (ref 70–99)

## 2011-11-09 LAB — GLUCOSE, CAPILLARY: Glucose-Capillary: 107 mg/dL — ABNORMAL HIGH (ref 70–99)

## 2011-11-14 LAB — GLUCOSE, CAPILLARY: Glucose-Capillary: 88 mg/dL (ref 70–99)

## 2011-11-15 ENCOUNTER — Emergency Department (HOSPITAL_COMMUNITY): Payer: Medicare Other

## 2011-11-15 ENCOUNTER — Emergency Department (HOSPITAL_COMMUNITY)
Admission: EM | Admit: 2011-11-15 | Discharge: 2011-11-15 | Disposition: A | Discharge status: Skilled Nursing Facility | Payer: Medicare Other | Attending: Emergency Medicine | Admitting: Emergency Medicine

## 2011-11-15 ENCOUNTER — Inpatient Hospital Stay
Admission: RE | Admit: 2011-11-15 | Discharge: 2012-08-08 | Disposition: A | Discharge status: Nursing Home (Includes ICF) | Payer: Medicaid Other | Source: Ambulatory Visit | Attending: Internal Medicine | Admitting: Internal Medicine

## 2011-11-15 ENCOUNTER — Encounter (HOSPITAL_COMMUNITY): Payer: Self-pay | Admitting: *Deleted

## 2011-11-15 DIAGNOSIS — K219 Gastro-esophageal reflux disease without esophagitis: Secondary | ICD-10-CM | POA: Insufficient documentation

## 2011-11-15 DIAGNOSIS — R0602 Shortness of breath: Principal | ICD-10-CM

## 2011-11-15 DIAGNOSIS — M79609 Pain in unspecified limb: Secondary | ICD-10-CM | POA: Insufficient documentation

## 2011-11-15 DIAGNOSIS — I252 Old myocardial infarction: Secondary | ICD-10-CM | POA: Diagnosis not present

## 2011-11-15 DIAGNOSIS — S0083XA Contusion of other part of head, initial encounter: Secondary | ICD-10-CM | POA: Diagnosis not present

## 2011-11-15 DIAGNOSIS — T148XXA Other injury of unspecified body region, initial encounter: Secondary | ICD-10-CM

## 2011-11-15 DIAGNOSIS — H1130 Conjunctival hemorrhage, unspecified eye: Secondary | ICD-10-CM

## 2011-11-15 DIAGNOSIS — J3489 Other specified disorders of nose and nasal sinuses: Secondary | ICD-10-CM | POA: Diagnosis not present

## 2011-11-15 DIAGNOSIS — Z862 Personal history of diseases of the blood and blood-forming organs and certain disorders involving the immune mechanism: Secondary | ICD-10-CM | POA: Diagnosis not present

## 2011-11-15 DIAGNOSIS — R609 Edema, unspecified: Secondary | ICD-10-CM

## 2011-11-15 DIAGNOSIS — S4980XA Other specified injuries of shoulder and upper arm, unspecified arm, initial encounter: Secondary | ICD-10-CM | POA: Diagnosis not present

## 2011-11-15 DIAGNOSIS — R05 Cough: Secondary | ICD-10-CM | POA: Diagnosis not present

## 2011-11-15 DIAGNOSIS — M5090 Cervical disc disorder, unspecified, unspecified cervical region: Secondary | ICD-10-CM | POA: Diagnosis not present

## 2011-11-15 DIAGNOSIS — S46909A Unspecified injury of unspecified muscle, fascia and tendon at shoulder and upper arm level, unspecified arm, initial encounter: Secondary | ICD-10-CM | POA: Diagnosis not present

## 2011-11-15 DIAGNOSIS — S0003XA Contusion of scalp, initial encounter: Secondary | ICD-10-CM | POA: Insufficient documentation

## 2011-11-15 DIAGNOSIS — R059 Cough, unspecified: Secondary | ICD-10-CM | POA: Diagnosis not present

## 2011-11-15 DIAGNOSIS — E785 Hyperlipidemia, unspecified: Secondary | ICD-10-CM | POA: Insufficient documentation

## 2011-11-15 DIAGNOSIS — R51 Headache: Secondary | ICD-10-CM | POA: Diagnosis not present

## 2011-11-15 DIAGNOSIS — F068 Other specified mental disorders due to known physiological condition: Secondary | ICD-10-CM | POA: Insufficient documentation

## 2011-11-15 DIAGNOSIS — M7989 Other specified soft tissue disorders: Secondary | ICD-10-CM | POA: Diagnosis not present

## 2011-11-15 DIAGNOSIS — W19XXXA Unspecified fall, initial encounter: Secondary | ICD-10-CM

## 2011-11-15 DIAGNOSIS — S1093XA Contusion of unspecified part of neck, initial encounter: Secondary | ICD-10-CM | POA: Diagnosis not present

## 2011-11-15 DIAGNOSIS — S8990XA Unspecified injury of unspecified lower leg, initial encounter: Secondary | ICD-10-CM | POA: Diagnosis not present

## 2011-11-15 DIAGNOSIS — Z79899 Other long term (current) drug therapy: Secondary | ICD-10-CM | POA: Insufficient documentation

## 2011-11-15 DIAGNOSIS — I10 Essential (primary) hypertension: Secondary | ICD-10-CM | POA: Diagnosis not present

## 2011-11-15 DIAGNOSIS — M1990 Unspecified osteoarthritis, unspecified site: Secondary | ICD-10-CM | POA: Diagnosis not present

## 2011-11-15 DIAGNOSIS — W1809XA Striking against other object with subsequent fall, initial encounter: Secondary | ICD-10-CM | POA: Insufficient documentation

## 2011-11-15 DIAGNOSIS — Z043 Encounter for examination and observation following other accident: Secondary | ICD-10-CM | POA: Diagnosis not present

## 2011-11-15 DIAGNOSIS — S9030XA Contusion of unspecified foot, initial encounter: Secondary | ICD-10-CM | POA: Diagnosis not present

## 2011-11-15 DIAGNOSIS — Z8639 Personal history of other endocrine, nutritional and metabolic disease: Secondary | ICD-10-CM | POA: Insufficient documentation

## 2011-11-15 DIAGNOSIS — S40029A Contusion of unspecified upper arm, initial encounter: Secondary | ICD-10-CM | POA: Diagnosis not present

## 2011-11-15 NOTE — ED Provider Notes (Signed)
History   This chart was scribed for [employer]. Rubin Payor, MD by Clarita Crane. The patient was seen in room [facility]. Patient's care was started at [phone].    CSN: [id number]  Arrival date & time 11/15/11  1191   First MD Initiated Contact with Patient 11/15/11 [phone]      Chief Complaint  Patient presents with  . Fall    (Consider location/radiation/quality/duration/timing/severity/associated sxs/prior treatment) HPI [name] is a 78 y.o. female who presents to the Emergency Department complaining of pain to proximal left humerus radiating to back and pain to right toes onset this morning after sustaining a fall this morning in which patient states she was fighting a visual hallucination and struck the left side of her face and body on the ground. States pain to LUE and right toes is aggravated by palpation. Also notes she is currently experiencing a HA after striking left side of head on the ground during the fall. Denies numbness, tingling, weakness, nausea, vomiting. Additionally, patient c/o constant moderate to severe congestion and a productive cough onset several days ago and persistent since. Patient with h/o cellulitis, dementia, osteoarthritis, HLD, HTN, GERD, MI, Gout.    Past Medical History  Diagnosis Date  . Cellulitis   . Dementia   . Anxiety   . Osteoarthritis   . Hyperlipidemia   . Hypertension   . Syncope   . Muscle weakness   . Gait abnormality   . GERD (gastroesophageal reflux disease)   . Gout   . Myocardial infarction 20 yrs ago    [place]    Past Surgical History  Procedure Date  . Appendectomy   . Tonsillectomy   . Cataract extraction w/phaco 08/04/2011    Procedure: CATARACT EXTRACTION PHACO AND INTRAOCULAR LENS PLACEMENT (IOC);  Surgeon: Gemma Payor;  Location: [facility];  Service: Ophthalmology;  Laterality: Right;  CDE=18.53  . Cataract extraction w/phaco 08/25/2011    Procedure: CATARACT EXTRACTION PHACO AND INTRAOCULAR LENS PLACEMENT  (IOC);  Surgeon: Gemma Payor;  Location: [facility];  Service: Ophthalmology;  Laterality: Left;  CDE:14.76    Family History  Problem Relation Age of Onset  . Diabetes Father   . Diabetes Sister   . Diabetes Brother   . Stroke Sister   . Stroke Sister   . Anesthesia problems Neg Hx   . Hypotension Neg Hx   . Malignant hyperthermia Neg Hx   . Pseudochol deficiency Neg Hx     History  Substance Use Topics  . Smoking status: Never Smoker   . Smokeless tobacco: Not on file  . Alcohol Use: No    OB History    Grav Para Term Preterm Abortions TAB SAB Ect Mult Living                  Review of Systems 10 Systems reviewed and are negative for acute change except as noted in the HPI.  Allergies  Bee venom; Aspirin; Cabbage; and Latex  Home Medications   Current Outpatient Rx  Name Route Sig Dispense Refill  . ACETAMINOPHEN 500 MG PO TABS Oral Take 1,000 mg by mouth 2 (two) times daily.     Marland Kitchen BENAZEPRIL HCL 10 MG PO TABS Oral Take 30 mg by mouth every morning.      Marland Kitchen BENZONATATE 100 MG PO CAPS Oral Take 100 mg by mouth 2 (two) times daily as needed. for cough    . CALCIUM CARBONATE 600 MG PO TABS Oral Take 500 mg by mouth  2 (two) times daily with a meal.      . CALCIUM CARBONATE ANTACID 500 MG PO CHEW Oral Chew 1 tablet by mouth 3 (three) times daily.      . OYSTER SHELL CALCIUM/D 500-200 MG-UNIT PO TABS Oral Take by mouth. Take one tablet by mouth three times a day     . CITALOPRAM HYDROBROMIDE PO Oral Take 20 mg by mouth daily.      Marland Kitchen CLOPIDOGREL BISULFATE 75 MG PO TABS Oral Take 75 mg by mouth every morning.     . CYCLOSPORINE 0.05 % OP EMUL Both Eyes Place 1 drop into both eyes 2 (two) times daily.     . DONEPEZIL HCL 10 MG PO TABS Oral Take 10 mg by mouth at bedtime.      . ENSURE PO LIQD Oral Take 237 mLs by mouth 2 (two) times daily between meals.      Marland Kitchen PRO-STAT 64 PO LIQD Oral Take 30 mLs by mouth at bedtime. For wound care     . FLUTICASONE PROPIONATE 50 MCG/ACT NA SUSP  Nasal 1 spray by Nasal route daily.      . FUROSEMIDE 20 MG PO TABS Oral Take 20 mg by mouth 2 (two) times daily.      Marland Kitchen LACTULOSE 10 GM/15ML PO SOLN  TAKE 15 MLs BY MOUTH     EVERY 4 DAYS AS NEEDEDFOR CONSTIPATION. 120 mL 0  . LOVASTATIN 40 MG PO TABS Oral Take 40 mg by mouth at bedtime.      Marland Kitchen NITROGLYCERIN 0.4 MG SL SUBL Sublingual Place 0.4 mg under the tongue every 5 (five) minutes as needed.      Marland Kitchen PANTOPRAZOLE SODIUM 40 MG PO TBEC Oral Take 40 mg by mouth 2 (two) times daily.      Marland Kitchen POTASSIUM CHLORIDE 10 MEQ PO TBCR Oral Take 10 mEq by mouth every morning.        BP 165/55  Pulse 62  Temp(Src) 98.6 F (37 C) (Oral)  Resp 20  Wt 176 lb (79.833 kg)  SpO2 98%  Physical Exam  Nursing note and vitals reviewed. Constitutional: She is oriented to person, place, and time. She appears well-developed and well-nourished. No distress.  HENT:  Head: Normocephalic and atraumatic.  Mouth/Throat: Oropharynx is clear and moist.       Hematoma to left forehead.   Eyes: EOM are normal. Pupils are equal, round, and reactive to light.       Subconjunctival hemorrhage to left eye.   Neck: Neck supple. No tracheal deviation present.  Cardiovascular: Normal rate and regular rhythm.  Exam reveals no gallop and no friction rub.   No murmur heard. Pulmonary/Chest: Effort normal. No respiratory distress.       Harsh lung sounds bilaterally.   Abdominal: Soft. Bowel sounds are normal. She exhibits no distension and no mass. There is no tenderness.  Musculoskeletal: Normal range of motion. She exhibits tenderness. She exhibits no edema.       Tender from proximal to mid left humerus. Diffusely tender right toes.    Neurological: She is alert and oriented to person, place, and time. No sensory deficit.  Skin: Skin is warm and dry.  Psychiatric: She has a normal mood and affect. Her behavior is normal.    ED Course  Procedures (including critical care time)  DIAGNOSTIC STUDIES: Oxygen Saturation is  98% on room air, normal by my interpretation.    COORDINATION OF CARE: 7:10AM-Patient informed of current plan for  treatment and evaluation and agrees with plan at this time.     Labs Reviewed - No data to display Dg Chest 1 View  11/15/2011  *RADIOLOGY REPORT*  Clinical Data: Cough  CHEST - 1 VIEW  Comparison: 08/28/2011 and 04/14/2011  Findings: Patient is rotated to the right, and asymmetric haziness of the left chest is favored to be due to the overlying left breast. Heart size appears stable.  Pulmonary vascularity is normal.  The lungs are clear. There are degenerative changes of both shoulders and the visualized upper thoracic spine.  IMPRESSION: No acute cardiopulmonary disease.  Original Report Authenticated By: Britta Mccreedy, M.D.   Ct Head Wo Contrast  11/15/2011  *RADIOLOGY REPORT*  Clinical Data:  Larey Seat this a.m.  Hit left side of face on ground. Headache and unsteady gait.  Dimension.  CT HEAD WITHOUT CONTRAST CT CERVICAL SPINE WITHOUT CONTRAST  Technique:  Multidetector CT imaging of the head and cervical spine was performed following the standard protocol without intravenous contrast.  Multiplanar CT image reconstructions of the cervical spine were also generated.  Comparison:  Head CT 01/24/2011  CT HEAD  Findings: The calvarium is thickened, chronic and unchanged finding.  The ventricles are normal in size.  Negative for hemorrhage, hydrocephalus, mass effect, mass lesion, or evidence of acute infarction.  There is focal left frontal scalp swelling/ hematoma.  The skull is intact.  Visualized paranasal sinuses and mastoid air cells are clear.  IMPRESSION:  1.  Left frontal scalp swelling/hematoma. 2.  No evidence of acute intracranial abnormality.  CT CERVICAL SPINE  Findings: Cervical spine is aligned from the skull base to the superior endplate of T2.  Vertebral body heights are maintained. There is multilevel degenerative change, with anterior osteophyte formation, posterior osseous  spurring, and slight loss of disc height.  There is multilevel facet joint degenerative change.  No evidence of fracture.  No evidence of epidural hematoma. The prevertebral soft tissues are within normal limits.  IMPRESSION:  1.  No acute bony abnormality. 2.  Multilevel degenerative disc disease.  Original Report Authenticated By: Britta Mccreedy, M.D.   Ct Cervical Spine Wo Contrast  11/15/2011  *RADIOLOGY REPORT*  Clinical Data:  Larey Seat this a.m.  Hit left side of face on ground. Headache and unsteady gait.  Dimension.  CT HEAD WITHOUT CONTRAST CT CERVICAL SPINE WITHOUT CONTRAST  Technique:  Multidetector CT imaging of the head and cervical spine was performed following the standard protocol without intravenous contrast.  Multiplanar CT image reconstructions of the cervical spine were also generated.  Comparison:  Head CT 01/24/2011  CT HEAD  Findings: The calvarium is thickened, chronic and unchanged finding.  The ventricles are normal in size.  Negative for hemorrhage, hydrocephalus, mass effect, mass lesion, or evidence of acute infarction.  There is focal left frontal scalp swelling/ hematoma.  The skull is intact.  Visualized paranasal sinuses and mastoid air cells are clear.  IMPRESSION:  1.  Left frontal scalp swelling/hematoma. 2.  No evidence of acute intracranial abnormality.  CT CERVICAL SPINE  Findings: Cervical spine is aligned from the skull base to the superior endplate of T2.  Vertebral body heights are maintained. There is multilevel degenerative change, with anterior osteophyte formation, posterior osseous spurring, and slight loss of disc height.  There is multilevel facet joint degenerative change.  No evidence of fracture.  No evidence of epidural hematoma. The prevertebral soft tissues are within normal limits.  IMPRESSION:  1.  No acute bony abnormality. 2.  Multilevel degenerative disc disease.  Original Report Authenticated By: Britta Mccreedy, M.D.   Dg Humerus Left  11/15/2011   *RADIOLOGY REPORT*  Clinical Data: Fall  LEFT HUMERUS - 2+ VIEW  Comparison: None.  Findings: No acute fracture or focal bony abnormalities of the humerus.  Degenerative change of the acromioclavicular joint. Subacromial spurring versus calcification of the rotator cuff tendons is noted.  IMPRESSION: No acute bony abnormality.  Original Report Authenticated By: Britta Mccreedy, M.D.   Dg Foot Complete Right  11/15/2011  *RADIOLOGY REPORT*  Clinical Data: Fall  RIGHT FOOT COMPLETE - 3+ VIEW  Comparison: None.  Findings: Markedly decreased bony mineralization.  Decreased mineralization can decrease the sensitivity for detecting acute fracture.  The joints of the foot are aligned.  There are degenerative changes of the midfoot.  No acute or healing fracture is identified.  There is soft tissue swelling over the dorsum of the foot.  IMPRESSION:  1.  No acute bony abnormality identified.  The bones are markedly osteopenic. 2.  Soft tissue swelling over the dorsum of the foot. 3.  Midfoot degenerative change.  Original Report Authenticated By: Britta Mccreedy, M.D.     1. Fall   2. Traumatic hematoma of forehead   3. Subconjunctival hematoma   4. Contusion       MDM  Patient fell out of bed at the nursing home today. She states that she had a dream she is fighting with her dead sister. She's a left forehead hematoma. She is a small subconjunctival hemorrhage. She has a contusion of her left upper arm. She also has contusion of her right foot. She'll be discharged back to the nursing home     I personally performed the services described in this documentation, which was scribed in my presence. The recorded information has been reviewed and considered.   Juliet Rude. Rubin Payor, MD 11/15/11 [phone]

## 2011-11-15 NOTE — ED Notes (Signed)
Pt from [facility].  Per report, pt fell and struck left side of face.  Left eye red, small lump noted on left side of forehead. Pt denies any pain in any additional areas.

## 2011-11-15 NOTE — Discharge Instructions (Signed)
Contusion A contusion is a deep bruise. Contusions are the result of an injury that caused bleeding under the skin. The contusion may turn blue, purple, or yellow. Minor injuries will give you a painless contusion, but more severe contusions may stay painful and swollen for a few weeks.  CAUSES  A contusion is usually caused by a blow, trauma, or direct force to an area of the body. SYMPTOMS   Swelling and redness of the injured area.   Bruising of the injured area.   Tenderness and soreness of the injured area.   Pain.  DIAGNOSIS  The diagnosis can be made by taking a history and physical exam. An X-ray, CT scan, or MRI may be needed to determine if there were any associated injuries, such as fractures. TREATMENT  Specific treatment will depend on what area of the body was injured. In general, the best treatment for a contusion is resting, icing, elevating, and applying cold compresses to the injured area. Over-the-counter medicines may also be recommended for pain control. Ask your caregiver what the best treatment is for your contusion. HOME CARE INSTRUCTIONS   Put ice on the injured area.   Put ice in a plastic bag.   Place a towel between your skin and the bag.   Leave the ice on for 15 to 20 minutes, 3 to 4 times a day.   Only take over-the-counter or prescription medicines for pain, discomfort, or fever as directed by your caregiver. Your caregiver may recommend avoiding anti-inflammatory medicines (aspirin, ibuprofen, and naproxen) for 48 hours because these medicines may increase bruising.   Rest the injured area.   If possible, elevate the injured area to reduce swelling.  SEEK IMMEDIATE MEDICAL CARE IF:   You have increased bruising or swelling.   You have pain that is getting worse.   Your swelling or pain is not relieved with medicines.  MAKE SURE YOU:   Understand these instructions.   Will watch your condition.   Will get help right away if you are not  doing well or get worse.  Document Released: 05/25/2005 Document Revised: 08/04/2011 Document Reviewed: 06/20/2011 [facility] Patient Information 2012 [place], [place].Subconjunctival Hemorrhage A subconjunctival hemorrhage is a bright red patch covering a portion of the white of the eye. The white part of the eye is called the sclera, and it is covered by a thin membrane called the conjunctiva. This membrane is clear, except for tiny blood vessels that you can see with the naked eye. When your eye is irritated or inflamed and becomes red, it is because the vessels in the conjunctiva are swollen. Sometimes, a blood vessel in the conjunctiva can break and bleed. When this occurs, the blood builds up between the conjunctiva and the sclera, and spreads out to create a red area. The red spot may be very small at first. It may then spread to cover a larger part of the surface of the eye, or even all of the visible white part of the eye. In almost all cases, the blood will go away and the eye will become white again. Before completely dissolving, however, the red area may spread. It may also become brownish-yellow in color, before going away. If a lot of blood collects under the conjunctiva, it may look like a bulge on the surface of the eye. This looks scary, but it will also eventually flatten out and go away. Subconjunctival hemorrhages do not cause pain, but if swollen, may cause a feeling of irritation. There  is no effect on vision.  CAUSES   The most common cause is mild trauma (rubbing the eye, irritation).   Subconjunctival hemorrhages can happen because of coughing or straining (lifting heavy objects), vomiting, or sneezing.   In some cases, your doctor may want to check your blood pressure. High blood pressure can also cause a sunconjunctival hemorrhage.   Severe trauma or blunt injuries.   Diseases that affect blood clotting (hemophilia, leukemia).   Abnormalities of blood vessels behind the eye  (carotid cavernous sinus fistula).   Tumors behind the eye.   Certain drugs (aspirin, coumadin, heparin).   Recent eye surgery.  HOME CARE INSTRUCTIONS   Do not worry about the appearance of your eye. You may continue your usual activities.   Often, follow-up is not necessary.  SEEK MEDICAL CARE IF:   Your eye becomes painful.   The bleeding does not disappear within 3 weeks.   Bleeding occurs elsewhere, for example, under the skin, in the mouth, or in the other eye.   You have recurring subconjunctival hemorrhages.  SEEK IMMEDIATE MEDICAL CARE IF:   Your vision changes or you have difficulty seeing.   You develop severe headache, persistent vomiting, confusion, or abnormal drowsiness (lethargy).   Your eye seems to bulge or protrude from the eye socket.   You notice the sudden appearance of bruises, or have spontaneous bleeding elsewhere on your body.  Document Released: 08/15/2005 Document Revised: 08/04/2011 Document Reviewed: 07/13/2009 [facility] Patient Information 2012 [place], [place].Hematoma A hematoma is a pocket of blood that collects under the skin, in an organ, in a body space, in a joint space, or in other tissue. The blood can clot to form a lump that you can see and feel. The lump is often firm, sore, and sometimes even painful and tender. Most hematomas get better in a few days to weeks. However, some hematomas may be serious and require medical care.Hematomas can range in size from very small to very large. CAUSES  A hematoma can be caused by a blunt or penetrating injury. It can also be caused by leakage from a blood vessel under the skin. Spontaneous leakage from a blood vessel is more likely to occur in elderly people, especially those taking blood thinners. Sometimes, a hematoma can develop after certain medical procedures. SYMPTOMS  Unlike a bruise, a hematoma forms a firm lump that you can feel. This lump is the collection of blood. The collection of blood  can also cause your skin to turn a blue to dark blue color. If the hematoma is close to the surface of the skin, it often produces a yellowish color in the skin. DIAGNOSIS  Your caregiver can determine whether you have a hematoma based on your history and a physical exam. TREATMENT  Hematomas usually go away on their own over time. Rarely does the blood need to be drained out of the body. HOME CARE INSTRUCTIONS   Put ice on the injured area.   Put ice in a plastic bag.   Place a towel between your skin and the bag.   Leave the ice on for 15 to 20 minutes, 3 to 4 times a day for the first 1 to 2 days.   After the first 2 days, switch to using warm compresses on the hematoma.   Elevate the injured area to help decrease pain and swelling. Wrapping the area with an elastic bandage may also be helpful. Compression helps to reduce swelling and promotes shrinking of the hematoma.  Make sure the bandage is not wrapped too tight.   If your hematoma is on a lower extremity and is painful, crutches may be helpful for a couple days.   Only take over-the-counter or prescription medicines for pain, discomfort, or fever as directed by your caregiver. Most patients can take acetaminophen or ibuprofen for the pain.  SEEK IMMEDIATE MEDICAL CARE IF:   You have increasing pain, or your pain is not controlled with medicine.   You have a fever.   You have worsening swelling or discoloration.   Your skin over the hematoma breaks or starts bleeding.  MAKE SURE YOU:   Understand these instructions.   Will watch your condition.   Will get help right away if you are not doing well or get worse.  Document Released: 03/29/2004 Document Revised: 08/04/2011 Document Reviewed: 04/18/2011 [facility] Patient Information 2012 [place], [place].

## 2011-11-16 DIAGNOSIS — D649 Anemia, unspecified: Secondary | ICD-10-CM | POA: Diagnosis not present

## 2011-11-16 DIAGNOSIS — I739 Peripheral vascular disease, unspecified: Secondary | ICD-10-CM | POA: Diagnosis not present

## 2011-11-16 DIAGNOSIS — N390 Urinary tract infection, site not specified: Secondary | ICD-10-CM | POA: Diagnosis not present

## 2011-11-16 DIAGNOSIS — R404 Transient alteration of awareness: Secondary | ICD-10-CM | POA: Diagnosis not present

## 2011-11-16 DIAGNOSIS — I10 Essential (primary) hypertension: Secondary | ICD-10-CM | POA: Diagnosis not present

## 2011-11-16 DIAGNOSIS — Z79899 Other long term (current) drug therapy: Secondary | ICD-10-CM | POA: Diagnosis not present

## 2011-11-21 LAB — GLUCOSE, CAPILLARY: Glucose-Capillary: 105 mg/dL — ABNORMAL HIGH (ref 70–99)

## 2011-11-24 DIAGNOSIS — F0391 Unspecified dementia with behavioral disturbance: Secondary | ICD-10-CM | POA: Diagnosis not present

## 2011-11-24 DIAGNOSIS — F411 Generalized anxiety disorder: Secondary | ICD-10-CM | POA: Diagnosis not present

## 2011-11-24 DIAGNOSIS — F329 Major depressive disorder, single episode, unspecified: Secondary | ICD-10-CM | POA: Diagnosis not present

## 2011-11-24 DIAGNOSIS — F03918 Unspecified dementia, unspecified severity, with other behavioral disturbance: Secondary | ICD-10-CM | POA: Diagnosis not present

## 2011-11-24 DIAGNOSIS — F39 Unspecified mood [affective] disorder: Secondary | ICD-10-CM | POA: Diagnosis not present

## 2011-11-24 DIAGNOSIS — F3289 Other specified depressive episodes: Secondary | ICD-10-CM | POA: Diagnosis not present

## 2011-11-30 LAB — GLUCOSE, CAPILLARY: Glucose-Capillary: 87 mg/dL (ref 70–99)

## 2011-12-02 LAB — GLUCOSE, CAPILLARY
Glucose-Capillary: 86 mg/dL (ref 70–99)
Glucose-Capillary: 182 mg/dL — ABNORMAL HIGH (ref 70–99)

## 2011-12-05 LAB — GLUCOSE, CAPILLARY: Glucose-Capillary: 77 mg/dL (ref 70–99)

## 2011-12-07 LAB — GLUCOSE, CAPILLARY: Glucose-Capillary: 78 mg/dL (ref 70–99)

## 2011-12-13 DIAGNOSIS — M109 Gout, unspecified: Secondary | ICD-10-CM | POA: Diagnosis not present

## 2011-12-14 DIAGNOSIS — M109 Gout, unspecified: Secondary | ICD-10-CM | POA: Diagnosis not present

## 2011-12-14 LAB — GLUCOSE, CAPILLARY: Glucose-Capillary: 85 mg/dL (ref 70–99)

## 2011-12-15 DIAGNOSIS — M109 Gout, unspecified: Secondary | ICD-10-CM | POA: Diagnosis not present

## 2011-12-23 LAB — GLUCOSE, CAPILLARY: Glucose-Capillary: 90 mg/dL (ref 70–99)

## 2011-12-26 LAB — GLUCOSE, CAPILLARY: Glucose-Capillary: 82 mg/dL (ref 70–99)

## 2011-12-28 LAB — GLUCOSE, CAPILLARY: Glucose-Capillary: 90 mg/dL (ref 70–99)

## 2011-12-29 LAB — GLUCOSE, CAPILLARY: Glucose-Capillary: 107 mg/dL — ABNORMAL HIGH (ref 70–99)

## 2012-01-07 DIAGNOSIS — F0391 Unspecified dementia with behavioral disturbance: Secondary | ICD-10-CM | POA: Diagnosis not present

## 2012-01-07 DIAGNOSIS — F411 Generalized anxiety disorder: Secondary | ICD-10-CM | POA: Diagnosis not present

## 2012-01-07 DIAGNOSIS — F03918 Unspecified dementia, unspecified severity, with other behavioral disturbance: Secondary | ICD-10-CM | POA: Diagnosis not present

## 2012-01-07 DIAGNOSIS — F39 Unspecified mood [affective] disorder: Secondary | ICD-10-CM | POA: Diagnosis not present

## 2012-01-07 DIAGNOSIS — F329 Major depressive disorder, single episode, unspecified: Secondary | ICD-10-CM | POA: Diagnosis not present

## 2012-01-07 DIAGNOSIS — F3289 Other specified depressive episodes: Secondary | ICD-10-CM | POA: Diagnosis not present

## 2012-01-09 LAB — GLUCOSE, CAPILLARY: Glucose-Capillary: 90 mg/dL (ref 70–99)

## 2012-01-11 LAB — GLUCOSE, CAPILLARY: Glucose-Capillary: 85 mg/dL (ref 70–99)

## 2012-01-18 LAB — GLUCOSE, CAPILLARY: Glucose-Capillary: 81 mg/dL (ref 70–99)

## 2012-01-20 LAB — GLUCOSE, CAPILLARY: Glucose-Capillary: 82 mg/dL (ref 70–99)

## 2012-01-21 ENCOUNTER — Ambulatory Visit (HOSPITAL_COMMUNITY): Payer: Medicare Other | Attending: Internal Medicine

## 2012-01-21 DIAGNOSIS — M7989 Other specified soft tissue disorders: Secondary | ICD-10-CM | POA: Insufficient documentation

## 2012-01-21 DIAGNOSIS — R0602 Shortness of breath: Secondary | ICD-10-CM | POA: Diagnosis not present

## 2012-01-21 DIAGNOSIS — I10 Essential (primary) hypertension: Secondary | ICD-10-CM | POA: Diagnosis not present

## 2012-01-23 DIAGNOSIS — I10 Essential (primary) hypertension: Secondary | ICD-10-CM | POA: Diagnosis not present

## 2012-01-23 DIAGNOSIS — R0609 Other forms of dyspnea: Secondary | ICD-10-CM | POA: Diagnosis not present

## 2012-01-23 DIAGNOSIS — N2881 Hypertrophy of kidney: Secondary | ICD-10-CM | POA: Diagnosis not present

## 2012-01-23 DIAGNOSIS — R0989 Other specified symptoms and signs involving the circulatory and respiratory systems: Secondary | ICD-10-CM | POA: Diagnosis not present

## 2012-01-23 DIAGNOSIS — R609 Edema, unspecified: Secondary | ICD-10-CM | POA: Diagnosis not present

## 2012-01-24 ENCOUNTER — Ambulatory Visit (HOSPITAL_COMMUNITY)
Admit: 2012-01-24 | Discharge: 2012-01-24 | Disposition: A | Discharge status: Skilled Nursing Facility | Payer: Medicare Other | Source: Skilled Nursing Facility | Attending: Internal Medicine | Admitting: Internal Medicine

## 2012-01-24 DIAGNOSIS — M79609 Pain in unspecified limb: Secondary | ICD-10-CM | POA: Diagnosis not present

## 2012-01-24 DIAGNOSIS — M7989 Other specified soft tissue disorders: Secondary | ICD-10-CM | POA: Diagnosis not present

## 2012-01-24 DIAGNOSIS — R609 Edema, unspecified: Secondary | ICD-10-CM | POA: Diagnosis not present

## 2012-01-24 IMAGING — CR DG FOOT COMPLETE 3+V*R*
3 series · 3 of 3 positions shown · non-contrast
Comparison: None

CLINICAL DATA: Gout.  No injury

RIGHT FOOT COMPLETE - 3+ VIEW

[view not recorded (1 of 3)]
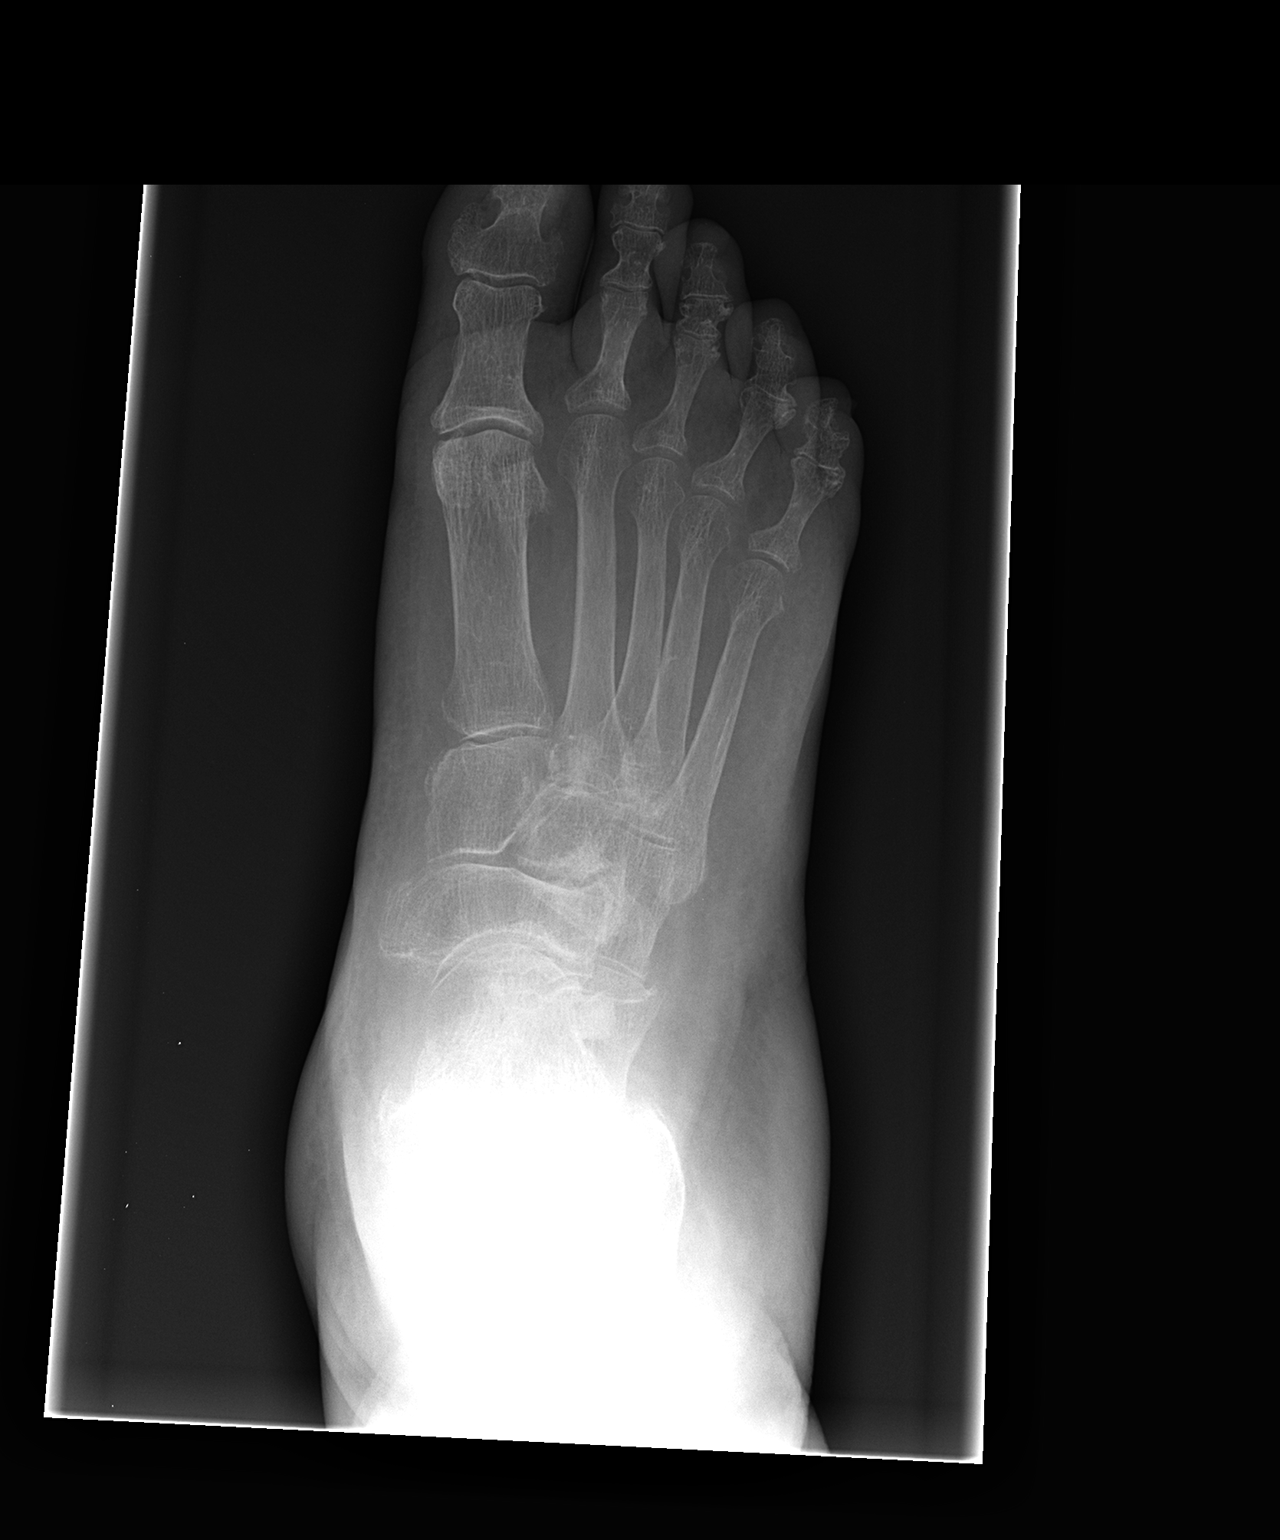

[view not recorded (2 of 3)]
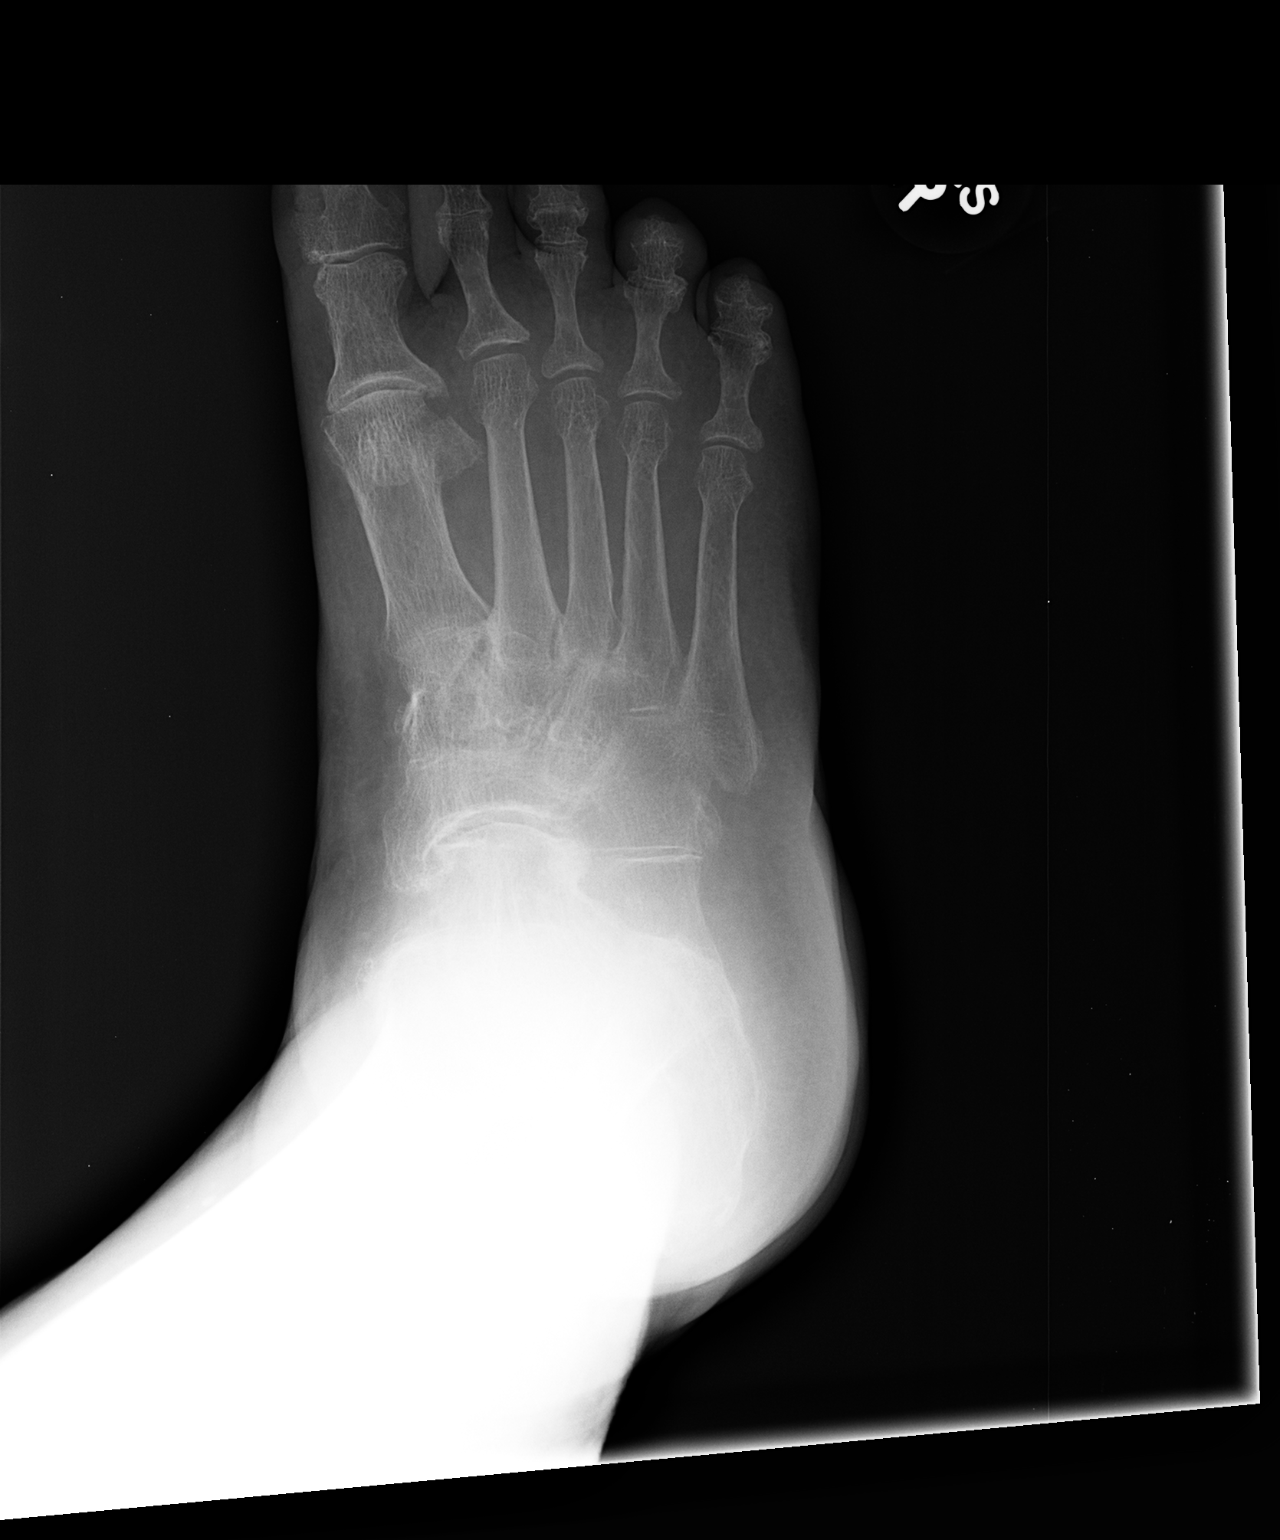

[view not recorded (3 of 3)]
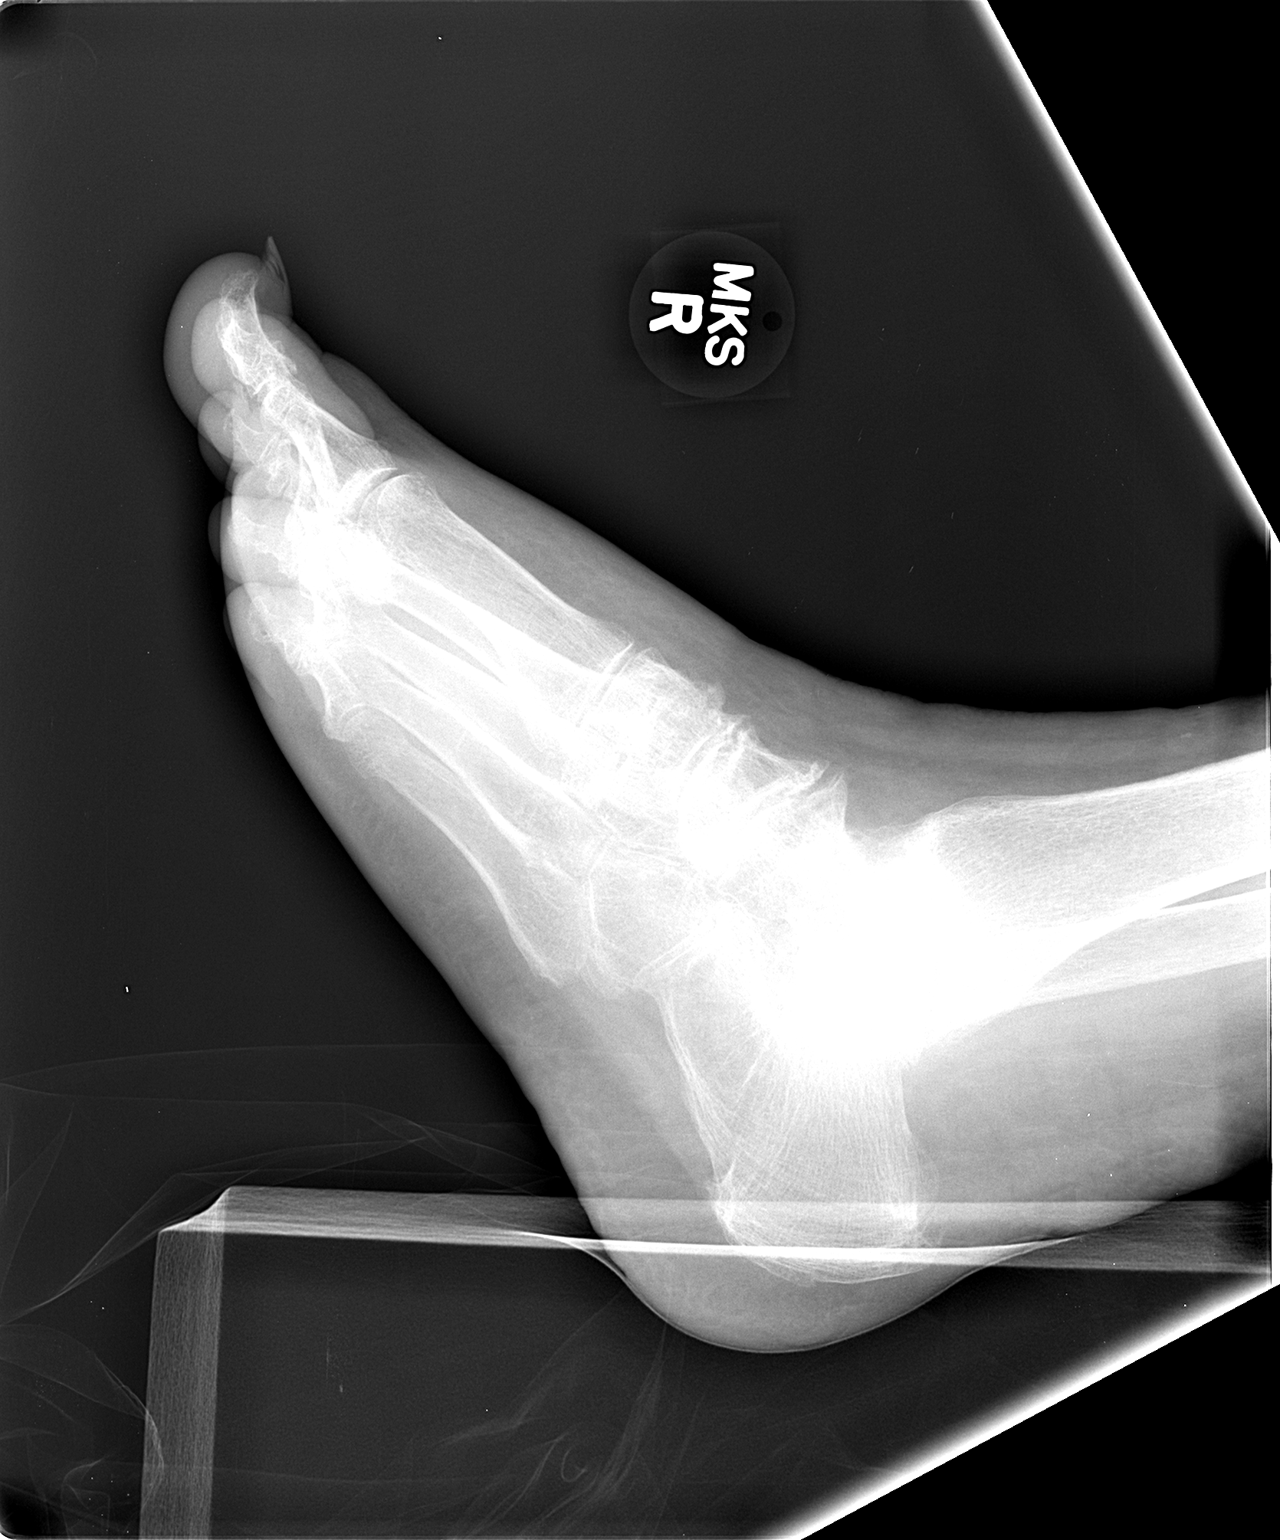

[3 of 3 positions shown; findings below may reference images not displayed]

FINDINGS: Generalized osteopenia.  No erosions are identified.
Negative for fracture.  Interphalangeal osteoarthritis is noted
involving the third, fourth, and fifth toes. Degenerative change
and spurring is present in the mid foot.
IMPRESSION: Negative for gouty arthritis.  Negative for fracture.

## 2012-01-30 DIAGNOSIS — I10 Essential (primary) hypertension: Secondary | ICD-10-CM | POA: Diagnosis not present

## 2012-01-30 LAB — GLUCOSE, CAPILLARY: Glucose-Capillary: 91 mg/dL (ref 70–99)

## 2012-01-31 DIAGNOSIS — N2881 Hypertrophy of kidney: Secondary | ICD-10-CM | POA: Diagnosis not present

## 2012-02-06 LAB — GLUCOSE, CAPILLARY
Glucose-Capillary: 85 mg/dL (ref 70–99)
Glucose-Capillary: 90 mg/dL (ref 70–99)

## 2012-02-10 DIAGNOSIS — H4011X0 Primary open-angle glaucoma, stage unspecified: Secondary | ICD-10-CM | POA: Diagnosis not present

## 2012-02-10 DIAGNOSIS — H409 Unspecified glaucoma: Secondary | ICD-10-CM | POA: Diagnosis not present

## 2012-02-10 LAB — GLUCOSE, CAPILLARY: Glucose-Capillary: 88 mg/dL (ref 70–99)

## 2012-02-13 DIAGNOSIS — I10 Essential (primary) hypertension: Secondary | ICD-10-CM | POA: Diagnosis not present

## 2012-02-13 LAB — GLUCOSE, CAPILLARY: Glucose-Capillary: 88 mg/dL (ref 70–99)

## 2012-02-14 DIAGNOSIS — R609 Edema, unspecified: Secondary | ICD-10-CM | POA: Diagnosis not present

## 2012-02-16 DIAGNOSIS — I10 Essential (primary) hypertension: Secondary | ICD-10-CM | POA: Diagnosis not present

## 2012-02-16 DIAGNOSIS — D649 Anemia, unspecified: Secondary | ICD-10-CM | POA: Diagnosis not present

## 2012-02-16 DIAGNOSIS — R0602 Shortness of breath: Secondary | ICD-10-CM | POA: Diagnosis not present

## 2012-02-22 LAB — GLUCOSE, CAPILLARY: Glucose-Capillary: 85 mg/dL (ref 70–99)

## 2012-03-02 DIAGNOSIS — R609 Edema, unspecified: Secondary | ICD-10-CM | POA: Diagnosis not present

## 2012-03-02 LAB — GLUCOSE, CAPILLARY: Glucose-Capillary: 88 mg/dL (ref 70–99)

## 2012-03-05 DIAGNOSIS — I10 Essential (primary) hypertension: Secondary | ICD-10-CM | POA: Diagnosis not present

## 2012-03-05 LAB — GLUCOSE, CAPILLARY: Glucose-Capillary: 81 mg/dL (ref 70–99)

## 2012-03-07 LAB — GLUCOSE, CAPILLARY: Glucose-Capillary: 90 mg/dL (ref 70–99)

## 2012-03-09 LAB — GLUCOSE, CAPILLARY: Glucose-Capillary: 79 mg/dL (ref 70–99)

## 2012-03-12 DIAGNOSIS — I10 Essential (primary) hypertension: Secondary | ICD-10-CM | POA: Diagnosis not present

## 2012-03-12 LAB — GLUCOSE, CAPILLARY: Glucose-Capillary: 92 mg/dL (ref 70–99)

## 2012-03-14 DIAGNOSIS — B029 Zoster without complications: Secondary | ICD-10-CM | POA: Diagnosis not present

## 2012-03-14 DIAGNOSIS — N390 Urinary tract infection, site not specified: Secondary | ICD-10-CM | POA: Diagnosis not present

## 2012-03-14 LAB — GLUCOSE, CAPILLARY

## 2012-03-15 DIAGNOSIS — L259 Unspecified contact dermatitis, unspecified cause: Secondary | ICD-10-CM | POA: Diagnosis not present

## 2012-03-19 DIAGNOSIS — F39 Unspecified mood [affective] disorder: Secondary | ICD-10-CM | POA: Diagnosis not present

## 2012-03-19 DIAGNOSIS — I10 Essential (primary) hypertension: Secondary | ICD-10-CM | POA: Diagnosis not present

## 2012-03-19 DIAGNOSIS — D649 Anemia, unspecified: Secondary | ICD-10-CM | POA: Diagnosis not present

## 2012-03-19 DIAGNOSIS — F411 Generalized anxiety disorder: Secondary | ICD-10-CM | POA: Diagnosis not present

## 2012-03-19 DIAGNOSIS — F3289 Other specified depressive episodes: Secondary | ICD-10-CM | POA: Diagnosis not present

## 2012-03-19 DIAGNOSIS — F329 Major depressive disorder, single episode, unspecified: Secondary | ICD-10-CM | POA: Diagnosis not present

## 2012-03-19 DIAGNOSIS — F0391 Unspecified dementia with behavioral disturbance: Secondary | ICD-10-CM | POA: Diagnosis not present

## 2012-03-19 DIAGNOSIS — F03918 Unspecified dementia, unspecified severity, with other behavioral disturbance: Secondary | ICD-10-CM | POA: Diagnosis not present

## 2012-03-21 LAB — GLUCOSE, CAPILLARY: Glucose-Capillary: 86 mg/dL (ref 70–99)

## 2012-03-23 LAB — GLUCOSE, CAPILLARY: Glucose-Capillary: 73 mg/dL (ref 70–99)

## 2012-03-26 LAB — GLUCOSE, CAPILLARY: Glucose-Capillary: 94 mg/dL (ref 70–99)

## 2012-03-28 LAB — GLUCOSE, CAPILLARY: Glucose-Capillary: 77 mg/dL (ref 70–99)

## 2012-03-30 LAB — GLUCOSE, CAPILLARY: Glucose-Capillary: 79 mg/dL (ref 70–99)

## 2012-04-03 DIAGNOSIS — F329 Major depressive disorder, single episode, unspecified: Secondary | ICD-10-CM | POA: Diagnosis not present

## 2012-04-03 DIAGNOSIS — F0391 Unspecified dementia with behavioral disturbance: Secondary | ICD-10-CM | POA: Diagnosis not present

## 2012-04-03 DIAGNOSIS — F03918 Unspecified dementia, unspecified severity, with other behavioral disturbance: Secondary | ICD-10-CM | POA: Diagnosis not present

## 2012-04-03 DIAGNOSIS — F39 Unspecified mood [affective] disorder: Secondary | ICD-10-CM | POA: Diagnosis not present

## 2012-04-03 DIAGNOSIS — F3289 Other specified depressive episodes: Secondary | ICD-10-CM | POA: Diagnosis not present

## 2012-04-03 DIAGNOSIS — F411 Generalized anxiety disorder: Secondary | ICD-10-CM | POA: Diagnosis not present

## 2012-04-06 LAB — GLUCOSE, CAPILLARY

## 2012-04-09 LAB — GLUCOSE, CAPILLARY: Glucose-Capillary: 92 mg/dL (ref 70–99)

## 2012-04-11 LAB — GLUCOSE, CAPILLARY: Glucose-Capillary: 90 mg/dL (ref 70–99)

## 2012-04-13 LAB — GLUCOSE, CAPILLARY: Glucose-Capillary: 95 mg/dL (ref 70–99)

## 2012-04-16 LAB — GLUCOSE, CAPILLARY: Glucose-Capillary: 80 mg/dL (ref 70–99)

## 2012-04-18 LAB — GLUCOSE, CAPILLARY: Glucose-Capillary: 77 mg/dL (ref 70–99)

## 2012-04-20 LAB — GLUCOSE, CAPILLARY: Glucose-Capillary: 98 mg/dL (ref 70–99)

## 2012-04-27 LAB — GLUCOSE, CAPILLARY: Glucose-Capillary: 87 mg/dL (ref 70–99)

## 2012-05-02 LAB — GLUCOSE, CAPILLARY: Glucose-Capillary: 84 mg/dL (ref 70–99)

## 2012-05-09 DIAGNOSIS — H4011X0 Primary open-angle glaucoma, stage unspecified: Secondary | ICD-10-CM | POA: Diagnosis not present

## 2012-05-09 DIAGNOSIS — H409 Unspecified glaucoma: Secondary | ICD-10-CM | POA: Diagnosis not present

## 2012-05-11 LAB — GLUCOSE, CAPILLARY

## 2012-05-14 LAB — GLUCOSE, CAPILLARY: Glucose-Capillary: 88 mg/dL (ref 70–99)

## 2012-05-16 LAB — GLUCOSE, CAPILLARY

## 2012-05-18 LAB — GLUCOSE, CAPILLARY: Glucose-Capillary: 90 mg/dL (ref 70–99)

## 2012-05-23 LAB — GLUCOSE, CAPILLARY

## 2012-05-24 DIAGNOSIS — F39 Unspecified mood [affective] disorder: Secondary | ICD-10-CM | POA: Diagnosis not present

## 2012-05-24 DIAGNOSIS — F03918 Unspecified dementia, unspecified severity, with other behavioral disturbance: Secondary | ICD-10-CM | POA: Diagnosis not present

## 2012-05-24 DIAGNOSIS — F3289 Other specified depressive episodes: Secondary | ICD-10-CM | POA: Diagnosis not present

## 2012-05-24 DIAGNOSIS — F411 Generalized anxiety disorder: Secondary | ICD-10-CM | POA: Diagnosis not present

## 2012-05-24 DIAGNOSIS — F0391 Unspecified dementia with behavioral disturbance: Secondary | ICD-10-CM | POA: Diagnosis not present

## 2012-05-24 DIAGNOSIS — F329 Major depressive disorder, single episode, unspecified: Secondary | ICD-10-CM | POA: Diagnosis not present

## 2012-05-28 LAB — GLUCOSE, CAPILLARY: Glucose-Capillary: 95 mg/dL (ref 70–99)

## 2012-05-30 LAB — GLUCOSE, CAPILLARY: Glucose-Capillary: 98 mg/dL (ref 70–99)

## 2012-06-01 LAB — GLUCOSE, CAPILLARY

## 2012-06-04 LAB — GLUCOSE, CAPILLARY: Glucose-Capillary: 88 mg/dL (ref 70–99)

## 2012-06-05 ENCOUNTER — Ambulatory Visit (HOSPITAL_COMMUNITY)
Admit: 2012-06-05 | Discharge: 2012-06-05 | Disposition: A | Discharge status: Skilled Nursing Facility | Payer: Medicare Other | Source: Skilled Nursing Facility | Attending: Internal Medicine | Admitting: Internal Medicine

## 2012-06-05 DIAGNOSIS — J811 Chronic pulmonary edema: Secondary | ICD-10-CM | POA: Diagnosis not present

## 2012-06-05 DIAGNOSIS — R609 Edema, unspecified: Secondary | ICD-10-CM | POA: Diagnosis not present

## 2012-07-06 DIAGNOSIS — R609 Edema, unspecified: Secondary | ICD-10-CM | POA: Diagnosis not present

## 2012-07-07 ENCOUNTER — Ambulatory Visit (HOSPITAL_COMMUNITY): Payer: Medicare Other | Attending: Internal Medicine

## 2012-07-07 DIAGNOSIS — R059 Cough, unspecified: Secondary | ICD-10-CM | POA: Insufficient documentation

## 2012-07-07 DIAGNOSIS — R5381 Other malaise: Secondary | ICD-10-CM | POA: Insufficient documentation

## 2012-07-07 DIAGNOSIS — R05 Cough: Secondary | ICD-10-CM | POA: Insufficient documentation

## 2012-07-07 DIAGNOSIS — R0989 Other specified symptoms and signs involving the circulatory and respiratory systems: Secondary | ICD-10-CM | POA: Insufficient documentation

## 2012-07-07 DIAGNOSIS — R5383 Other fatigue: Secondary | ICD-10-CM | POA: Diagnosis not present

## 2012-07-07 DIAGNOSIS — I517 Cardiomegaly: Secondary | ICD-10-CM | POA: Diagnosis not present

## 2012-07-09 DIAGNOSIS — E785 Hyperlipidemia, unspecified: Secondary | ICD-10-CM | POA: Diagnosis not present

## 2012-07-09 DIAGNOSIS — I10 Essential (primary) hypertension: Secondary | ICD-10-CM | POA: Diagnosis not present

## 2012-07-09 DIAGNOSIS — D649 Anemia, unspecified: Secondary | ICD-10-CM | POA: Diagnosis not present

## 2012-07-23 DIAGNOSIS — R0609 Other forms of dyspnea: Secondary | ICD-10-CM | POA: Diagnosis not present

## 2012-07-23 DIAGNOSIS — R0989 Other specified symptoms and signs involving the circulatory and respiratory systems: Secondary | ICD-10-CM | POA: Diagnosis not present

## 2012-07-23 DIAGNOSIS — I10 Essential (primary) hypertension: Secondary | ICD-10-CM | POA: Diagnosis not present

## 2012-08-07 DIAGNOSIS — R609 Edema, unspecified: Secondary | ICD-10-CM | POA: Diagnosis not present

## 2012-08-08 ENCOUNTER — Inpatient Hospital Stay
Admission: RE | Admit: 2012-08-08 | Discharge: 2013-11-26 | Disposition: A | Discharge status: Nursing Home (Includes ICF) | Payer: Medicaid Other | Source: Ambulatory Visit | Attending: Internal Medicine | Admitting: Internal Medicine

## 2012-08-08 ENCOUNTER — Encounter (HOSPITAL_COMMUNITY): Payer: Self-pay

## 2012-08-08 ENCOUNTER — Emergency Department (HOSPITAL_COMMUNITY)
Admission: EM | Admit: 2012-08-08 | Discharge: 2012-08-08 | Disposition: A | Discharge status: Skilled Nursing Facility | Payer: Medicare Other | Attending: Emergency Medicine | Admitting: Emergency Medicine

## 2012-08-08 DIAGNOSIS — R059 Cough, unspecified: Principal | ICD-10-CM

## 2012-08-08 DIAGNOSIS — S81009A Unspecified open wound, unspecified knee, initial encounter: Secondary | ICD-10-CM | POA: Insufficient documentation

## 2012-08-08 DIAGNOSIS — Y9229 Other specified public building as the place of occurrence of the external cause: Secondary | ICD-10-CM | POA: Insufficient documentation

## 2012-08-08 DIAGNOSIS — M6281 Muscle weakness (generalized): Secondary | ICD-10-CM | POA: Insufficient documentation

## 2012-08-08 DIAGNOSIS — K219 Gastro-esophageal reflux disease without esophagitis: Secondary | ICD-10-CM | POA: Insufficient documentation

## 2012-08-08 DIAGNOSIS — I252 Old myocardial infarction: Secondary | ICD-10-CM | POA: Insufficient documentation

## 2012-08-08 DIAGNOSIS — F411 Generalized anxiety disorder: Secondary | ICD-10-CM | POA: Insufficient documentation

## 2012-08-08 DIAGNOSIS — Y939 Activity, unspecified: Secondary | ICD-10-CM | POA: Insufficient documentation

## 2012-08-08 DIAGNOSIS — M1990 Unspecified osteoarthritis, unspecified site: Secondary | ICD-10-CM | POA: Insufficient documentation

## 2012-08-08 DIAGNOSIS — S81809A Unspecified open wound, unspecified lower leg, initial encounter: Secondary | ICD-10-CM | POA: Diagnosis not present

## 2012-08-08 DIAGNOSIS — E785 Hyperlipidemia, unspecified: Secondary | ICD-10-CM | POA: Insufficient documentation

## 2012-08-08 DIAGNOSIS — R0989 Other specified symptoms and signs involving the circulatory and respiratory systems: Secondary | ICD-10-CM

## 2012-08-08 DIAGNOSIS — I10 Essential (primary) hypertension: Secondary | ICD-10-CM | POA: Diagnosis not present

## 2012-08-08 DIAGNOSIS — F0390 Unspecified dementia without behavioral disturbance: Secondary | ICD-10-CM | POA: Diagnosis not present

## 2012-08-08 DIAGNOSIS — Z791 Long term (current) use of non-steroidal anti-inflammatories (NSAID): Secondary | ICD-10-CM | POA: Insufficient documentation

## 2012-08-08 DIAGNOSIS — Z79899 Other long term (current) drug therapy: Secondary | ICD-10-CM | POA: Insufficient documentation

## 2012-08-08 DIAGNOSIS — W19XXXA Unspecified fall, initial encounter: Secondary | ICD-10-CM

## 2012-08-08 DIAGNOSIS — S91009A Unspecified open wound, unspecified ankle, initial encounter: Secondary | ICD-10-CM | POA: Insufficient documentation

## 2012-08-08 DIAGNOSIS — R269 Unspecified abnormalities of gait and mobility: Secondary | ICD-10-CM | POA: Insufficient documentation

## 2012-08-08 DIAGNOSIS — R05 Cough: Principal | ICD-10-CM

## 2012-08-08 DIAGNOSIS — S81812A Laceration without foreign body, left lower leg, initial encounter: Secondary | ICD-10-CM

## 2012-08-08 DIAGNOSIS — W01119A Fall on same level from slipping, tripping and stumbling with subsequent striking against unspecified sharp object, initial encounter: Secondary | ICD-10-CM | POA: Insufficient documentation

## 2012-08-08 DIAGNOSIS — L259 Unspecified contact dermatitis, unspecified cause: Secondary | ICD-10-CM | POA: Diagnosis not present

## 2012-08-08 DIAGNOSIS — M109 Gout, unspecified: Secondary | ICD-10-CM | POA: Insufficient documentation

## 2012-08-08 MED ORDER — BACITRACIN ZINC 500 UNIT/GM EX OINT
TOPICAL_OINTMENT | CUTANEOUS | Status: AC
  Filled 2012-08-08: qty 1.8

## 2012-08-08 NOTE — ED Provider Notes (Signed)
History     CSN: [id number]  Arrival date & time 08/08/12  4782   First MD Initiated Contact with Patient 08/08/12 [phone]      Chief Complaint  Patient presents with  . Extremity Laceration    (Consider location/radiation/quality/duration/timing/severity/associated sxs/prior treatment) The history is provided by the nursing home. The history is limited by the condition of the patient (dementia).  She apparently fell out of bed and hit her left lower leg on a wheelchair causing a deep laceration. Blood was noted to be spurting from the wound and she was transported directly to the emergency department.  Past Medical History  Diagnosis Date  . Cellulitis   . Dementia   . Anxiety   . Osteoarthritis   . Hyperlipidemia   . Hypertension   . Syncope   . Muscle weakness   . Gait abnormality   . GERD (gastroesophageal reflux disease)   . Gout   . Myocardial infarction 20 yrs ago    [place]    Past Surgical History  Procedure Date  . Appendectomy   . Tonsillectomy   . Cataract extraction w/phaco 08/04/2011    Procedure: CATARACT EXTRACTION PHACO AND INTRAOCULAR LENS PLACEMENT (IOC);  Surgeon: Gemma Payor;  Location: [facility];  Service: Ophthalmology;  Laterality: Right;  CDE=18.53  . Cataract extraction w/phaco 08/25/2011    Procedure: CATARACT EXTRACTION PHACO AND INTRAOCULAR LENS PLACEMENT (IOC);  Surgeon: Gemma Payor;  Location: [facility];  Service: Ophthalmology;  Laterality: Left;  CDE:14.76    Family History  Problem Relation Age of Onset  . Diabetes Father   . Diabetes Sister   . Diabetes Brother   . Stroke Sister   . Stroke Sister   . Anesthesia problems Neg Hx   . Hypotension Neg Hx   . Malignant hyperthermia Neg Hx   . Pseudochol deficiency Neg Hx     History  Substance Use Topics  . Smoking status: Never Smoker   . Smokeless tobacco: Not on file  . Alcohol Use: No    OB History    Grav Para Term Preterm Abortions TAB SAB Ect Mult Living                   Review of Systems  Unable to perform ROS: Dementia    Allergies  Bee venom; Aspirin; Cabbage; and Latex  Home Medications   Current Outpatient Rx  Name  Route  Sig  Dispense  Refill  . ACETAMINOPHEN 500 MG PO TABS   Oral   Take 1,000 mg by mouth 2 (two) times daily.          Marland Kitchen BENAZEPRIL HCL 10 MG PO TABS   Oral   Take 30 mg by mouth every morning.           Marland Kitchen BENZONATATE 100 MG PO CAPS   Oral   Take 100 mg by mouth 2 (two) times daily as needed. for cough         . CALCIUM CARBONATE 600 MG PO TABS   Oral   Take 500 mg by mouth 2 (two) times daily with a meal.           . CALCIUM CARBONATE ANTACID 500 MG PO CHEW   Oral   Chew 1 tablet by mouth 3 (three) times daily.           . OYSTER SHELL CALCIUM/D 500-200 MG-UNIT PO TABS   Oral   Take by mouth. Take one tablet by mouth three  times a day          . CITALOPRAM HYDROBROMIDE PO   Oral   Take 20 mg by mouth daily.           Marland Kitchen CLOPIDOGREL BISULFATE 75 MG PO TABS   Oral   Take 75 mg by mouth every morning.          . CYCLOSPORINE 0.05 % OP EMUL   Both Eyes   Place 1 drop into both eyes 2 (two) times daily.          . DONEPEZIL HCL 10 MG PO TABS   Oral   Take 10 mg by mouth at bedtime.           . ENSURE PO LIQD   Oral   Take 237 mLs by mouth 2 (two) times daily between meals.           Marland Kitchen PRO-STAT 64 PO LIQD   Oral   Take 30 mLs by mouth at bedtime. For wound care          . FLUTICASONE PROPIONATE 50 MCG/ACT NA SUSP   Nasal   1 spray by Nasal route daily.           . FUROSEMIDE 20 MG PO TABS   Oral   Take 20 mg by mouth 2 (two) times daily.           Marland Kitchen LACTULOSE 10 GM/15ML PO SOLN      TAKE 15 MLs BY MOUTH     EVERY 4 DAYS AS NEEDEDFOR CONSTIPATION.   120 mL   0   . LOVASTATIN 40 MG PO TABS   Oral   Take 40 mg by mouth at bedtime.           Marland Kitchen NITROGLYCERIN 0.4 MG SL SUBL   Sublingual   Place 0.4 mg under the tongue every 5 (five) minutes as needed.            Marland Kitchen PANTOPRAZOLE SODIUM 40 MG PO TBEC   Oral   Take 40 mg by mouth 2 (two) times daily.           Marland Kitchen POTASSIUM CHLORIDE 10 MEQ PO TBCR   Oral   Take 10 mEq by mouth every morning.             BP 186/77  Pulse 68  Temp 98.3 F (36.8 C) (Oral)  Resp 16  SpO2 98%  Physical Exam  Nursing note and vitals reviewed. 78 year old female, resting comfortably and in no acute distress. Vital signs are significant for hypertension with blood pressure 186/77. Oxygen saturation is 98%, which is normal. Head is normocephalic and atraumatic. PERRLA, EOMI. Oropharynx is clear. Neck is nontender and supple without adenopathy or JVD. Back is nontender and there is no CVA tenderness. Lungs are clear without rales, wheezes, or rhonchi. Chest is nontender. Heart has regular rate and rhythm without murmur. Abdomen is soft, flat, nontender without masses or hepatosplenomegaly and peristalsis is normoactive. Extremities have 2+ edema with venous stasis changes present. Deep laceration is present over the anterior lateral aspect of the left lower leg with blood spurting from the wound.. Skin is warm and dry without rash. Neurologic: She is awake and alert but nonverbal, cranial nerves are intact, there are no gross motor or sensory deficits.   ED Course  Procedures (including critical care time)  LACERATION REPAIR Performed by: AVWUJ,[email or web address] Authorized by: [email or web address],FAOZH Consent: Verbal consent obtained. Risks and benefits: risks, benefits  and alternatives were discussed Consent given by: patient Patient identity confirmed: provided demographic data Prepped and Draped in normal sterile fashion Wound explored Bleeding was controlled with direct pressure prior to wound exploration.  Laceration Location: Left lower leg  Laceration Length: 17 cm  No Foreign Bodies seen or palpated  Anesthesia: local infiltration  Local anesthetic: lidocaine 1% without epinephrine  Anesthetic total: 8  ml  Amount of cleaning: standard  Skin closure: Close   Number of sutures: Single running suture   Technique: Running stitch   Patient tolerance: Patient tolerated the procedure well with no immediate complications. There was some oozing around suture sites which is to be expected and the patient on Plavix.   1. Laceration of left lower leg       MDM  Deep laceration of left lower leg with arterial bleeder present. Bleeding will be controlled with combination of direct pressure and a suturing to allow the bleeding site tampenade. Review of old records shows she had her last tetanus immunization in 2008.        Dione Booze, MD 08/08/12 [phone]

## 2012-08-08 NOTE — ED Notes (Signed)
Pt states she fell out of bed, hit left lower leg on w/c.

## 2012-08-10 LAB — GLUCOSE, CAPILLARY

## 2012-08-13 DIAGNOSIS — I10 Essential (primary) hypertension: Secondary | ICD-10-CM | POA: Diagnosis not present

## 2012-08-14 DIAGNOSIS — R05 Cough: Secondary | ICD-10-CM | POA: Diagnosis not present

## 2012-08-14 DIAGNOSIS — R059 Cough, unspecified: Secondary | ICD-10-CM | POA: Diagnosis not present

## 2012-08-17 DIAGNOSIS — L0291 Cutaneous abscess, unspecified: Secondary | ICD-10-CM | POA: Diagnosis not present

## 2012-08-17 DIAGNOSIS — L0390 Cellulitis, unspecified: Secondary | ICD-10-CM | POA: Diagnosis not present

## 2012-08-17 LAB — GLUCOSE, CAPILLARY: Glucose-Capillary: 77 mg/dL (ref 70–99)

## 2012-08-20 DIAGNOSIS — L97309 Non-pressure chronic ulcer of unspecified ankle with unspecified severity: Secondary | ICD-10-CM | POA: Diagnosis not present

## 2012-08-20 LAB — GLUCOSE, CAPILLARY: Glucose-Capillary: 78 mg/dL (ref 70–99)

## 2012-08-22 LAB — GLUCOSE, CAPILLARY: Glucose-Capillary: 78 mg/dL (ref 70–99)

## 2012-08-23 DIAGNOSIS — L0291 Cutaneous abscess, unspecified: Secondary | ICD-10-CM | POA: Diagnosis not present

## 2012-08-23 DIAGNOSIS — L0390 Cellulitis, unspecified: Secondary | ICD-10-CM | POA: Diagnosis not present

## 2012-08-24 LAB — GLUCOSE, CAPILLARY: Glucose-Capillary: 161 mg/dL — ABNORMAL HIGH (ref 70–99)

## 2012-08-27 LAB — GLUCOSE, CAPILLARY: Glucose-Capillary: 102 mg/dL — ABNORMAL HIGH (ref 70–99)

## 2012-08-29 DIAGNOSIS — S81009A Unspecified open wound, unspecified knee, initial encounter: Secondary | ICD-10-CM | POA: Diagnosis not present

## 2012-08-29 DIAGNOSIS — S81809A Unspecified open wound, unspecified lower leg, initial encounter: Secondary | ICD-10-CM | POA: Diagnosis not present

## 2012-08-30 DIAGNOSIS — H6120 Impacted cerumen, unspecified ear: Secondary | ICD-10-CM | POA: Diagnosis not present

## 2012-09-04 DIAGNOSIS — L0291 Cutaneous abscess, unspecified: Secondary | ICD-10-CM | POA: Diagnosis not present

## 2012-09-05 LAB — GLUCOSE, CAPILLARY

## 2012-09-08 ENCOUNTER — Ambulatory Visit (HOSPITAL_COMMUNITY): Payer: Medicare Other | Attending: Internal Medicine

## 2012-09-08 DIAGNOSIS — I517 Cardiomegaly: Secondary | ICD-10-CM | POA: Insufficient documentation

## 2012-09-08 DIAGNOSIS — R059 Cough, unspecified: Secondary | ICD-10-CM | POA: Diagnosis not present

## 2012-09-08 DIAGNOSIS — R0989 Other specified symptoms and signs involving the circulatory and respiratory systems: Secondary | ICD-10-CM | POA: Diagnosis not present

## 2012-09-08 DIAGNOSIS — R05 Cough: Secondary | ICD-10-CM | POA: Diagnosis not present

## 2012-09-08 LAB — GLUCOSE, CAPILLARY: Glucose-Capillary: 84 mg/dL (ref 70–99)

## 2012-09-10 LAB — GLUCOSE, CAPILLARY: Glucose-Capillary: 88 mg/dL (ref 70–99)

## 2012-09-12 LAB — GLUCOSE, CAPILLARY: Glucose-Capillary: 92 mg/dL (ref 70–99)

## 2012-09-14 ENCOUNTER — Ambulatory Visit (HOSPITAL_COMMUNITY)
Admit: 2012-09-14 | Discharge: 2012-09-14 | Disposition: A | Discharge status: Home / Self Care | Payer: Medicare Other | Source: Ambulatory Visit | Attending: Internal Medicine | Admitting: Internal Medicine

## 2012-09-14 DIAGNOSIS — R05 Cough: Secondary | ICD-10-CM | POA: Insufficient documentation

## 2012-09-14 DIAGNOSIS — R0602 Shortness of breath: Secondary | ICD-10-CM | POA: Insufficient documentation

## 2012-09-14 DIAGNOSIS — R059 Cough, unspecified: Secondary | ICD-10-CM | POA: Insufficient documentation

## 2012-09-14 LAB — GLUCOSE, CAPILLARY: Glucose-Capillary: 92 mg/dL (ref 70–99)

## 2012-09-18 DIAGNOSIS — H409 Unspecified glaucoma: Secondary | ICD-10-CM | POA: Diagnosis not present

## 2012-09-18 DIAGNOSIS — Z961 Presence of intraocular lens: Secondary | ICD-10-CM | POA: Diagnosis not present

## 2012-09-18 DIAGNOSIS — H04129 Dry eye syndrome of unspecified lacrimal gland: Secondary | ICD-10-CM | POA: Diagnosis not present

## 2012-09-20 DIAGNOSIS — F03918 Unspecified dementia, unspecified severity, with other behavioral disturbance: Secondary | ICD-10-CM | POA: Diagnosis not present

## 2012-09-20 DIAGNOSIS — F411 Generalized anxiety disorder: Secondary | ICD-10-CM | POA: Diagnosis not present

## 2012-09-20 DIAGNOSIS — F39 Unspecified mood [affective] disorder: Secondary | ICD-10-CM | POA: Diagnosis not present

## 2012-09-20 DIAGNOSIS — F0391 Unspecified dementia with behavioral disturbance: Secondary | ICD-10-CM | POA: Diagnosis not present

## 2012-09-26 LAB — GLUCOSE, CAPILLARY: Glucose-Capillary: 91 mg/dL (ref 70–99)

## 2012-09-28 LAB — GLUCOSE, CAPILLARY

## 2012-10-01 LAB — GLUCOSE, CAPILLARY: Glucose-Capillary: 80 mg/dL (ref 70–99)

## 2012-10-03 LAB — GLUCOSE, CAPILLARY: Glucose-Capillary: 94 mg/dL (ref 70–99)

## 2012-10-05 LAB — GLUCOSE, CAPILLARY: Glucose-Capillary: 91 mg/dL (ref 70–99)

## 2012-10-08 LAB — GLUCOSE, CAPILLARY: Glucose-Capillary: 81 mg/dL (ref 70–99)

## 2012-10-09 DIAGNOSIS — R262 Difficulty in walking, not elsewhere classified: Secondary | ICD-10-CM | POA: Diagnosis not present

## 2012-10-09 DIAGNOSIS — M159 Polyosteoarthritis, unspecified: Secondary | ICD-10-CM | POA: Diagnosis not present

## 2012-10-09 DIAGNOSIS — M6281 Muscle weakness (generalized): Secondary | ICD-10-CM | POA: Diagnosis not present

## 2012-10-10 DIAGNOSIS — M6281 Muscle weakness (generalized): Secondary | ICD-10-CM | POA: Diagnosis not present

## 2012-10-10 DIAGNOSIS — R262 Difficulty in walking, not elsewhere classified: Secondary | ICD-10-CM | POA: Diagnosis not present

## 2012-10-10 DIAGNOSIS — M159 Polyosteoarthritis, unspecified: Secondary | ICD-10-CM | POA: Diagnosis not present

## 2012-10-12 DIAGNOSIS — M159 Polyosteoarthritis, unspecified: Secondary | ICD-10-CM | POA: Diagnosis not present

## 2012-10-12 DIAGNOSIS — R262 Difficulty in walking, not elsewhere classified: Secondary | ICD-10-CM | POA: Diagnosis not present

## 2012-10-12 DIAGNOSIS — M6281 Muscle weakness (generalized): Secondary | ICD-10-CM | POA: Diagnosis not present

## 2012-10-15 DIAGNOSIS — M159 Polyosteoarthritis, unspecified: Secondary | ICD-10-CM | POA: Diagnosis not present

## 2012-10-15 DIAGNOSIS — M6281 Muscle weakness (generalized): Secondary | ICD-10-CM | POA: Diagnosis not present

## 2012-10-15 DIAGNOSIS — R262 Difficulty in walking, not elsewhere classified: Secondary | ICD-10-CM | POA: Diagnosis not present

## 2012-10-16 DIAGNOSIS — M159 Polyosteoarthritis, unspecified: Secondary | ICD-10-CM | POA: Diagnosis not present

## 2012-10-16 DIAGNOSIS — M6281 Muscle weakness (generalized): Secondary | ICD-10-CM | POA: Diagnosis not present

## 2012-10-16 DIAGNOSIS — R262 Difficulty in walking, not elsewhere classified: Secondary | ICD-10-CM | POA: Diagnosis not present

## 2012-10-17 DIAGNOSIS — R262 Difficulty in walking, not elsewhere classified: Secondary | ICD-10-CM | POA: Diagnosis not present

## 2012-10-17 DIAGNOSIS — M6281 Muscle weakness (generalized): Secondary | ICD-10-CM | POA: Diagnosis not present

## 2012-10-17 DIAGNOSIS — M159 Polyosteoarthritis, unspecified: Secondary | ICD-10-CM | POA: Diagnosis not present

## 2012-10-18 DIAGNOSIS — R262 Difficulty in walking, not elsewhere classified: Secondary | ICD-10-CM | POA: Diagnosis not present

## 2012-10-18 DIAGNOSIS — M6281 Muscle weakness (generalized): Secondary | ICD-10-CM | POA: Diagnosis not present

## 2012-10-18 DIAGNOSIS — M159 Polyosteoarthritis, unspecified: Secondary | ICD-10-CM | POA: Diagnosis not present

## 2012-10-19 DIAGNOSIS — R262 Difficulty in walking, not elsewhere classified: Secondary | ICD-10-CM | POA: Diagnosis not present

## 2012-10-19 DIAGNOSIS — M6281 Muscle weakness (generalized): Secondary | ICD-10-CM | POA: Diagnosis not present

## 2012-10-19 DIAGNOSIS — M159 Polyosteoarthritis, unspecified: Secondary | ICD-10-CM | POA: Diagnosis not present

## 2012-10-19 LAB — GLUCOSE, CAPILLARY: Glucose-Capillary: 73 mg/dL (ref 70–99)

## 2012-10-22 DIAGNOSIS — M6281 Muscle weakness (generalized): Secondary | ICD-10-CM | POA: Diagnosis not present

## 2012-10-22 DIAGNOSIS — M159 Polyosteoarthritis, unspecified: Secondary | ICD-10-CM | POA: Diagnosis not present

## 2012-10-22 DIAGNOSIS — R262 Difficulty in walking, not elsewhere classified: Secondary | ICD-10-CM | POA: Diagnosis not present

## 2012-10-22 LAB — GLUCOSE, CAPILLARY: Glucose-Capillary: 95 mg/dL (ref 70–99)

## 2012-10-23 DIAGNOSIS — M6281 Muscle weakness (generalized): Secondary | ICD-10-CM | POA: Diagnosis not present

## 2012-10-23 DIAGNOSIS — R262 Difficulty in walking, not elsewhere classified: Secondary | ICD-10-CM | POA: Diagnosis not present

## 2012-10-23 DIAGNOSIS — M159 Polyosteoarthritis, unspecified: Secondary | ICD-10-CM | POA: Diagnosis not present

## 2012-10-24 DIAGNOSIS — M159 Polyosteoarthritis, unspecified: Secondary | ICD-10-CM | POA: Diagnosis not present

## 2012-10-24 DIAGNOSIS — M6281 Muscle weakness (generalized): Secondary | ICD-10-CM | POA: Diagnosis not present

## 2012-10-24 DIAGNOSIS — R262 Difficulty in walking, not elsewhere classified: Secondary | ICD-10-CM | POA: Diagnosis not present

## 2012-10-24 LAB — GLUCOSE, CAPILLARY: Glucose-Capillary: 89 mg/dL (ref 70–99)

## 2012-10-25 DIAGNOSIS — R262 Difficulty in walking, not elsewhere classified: Secondary | ICD-10-CM | POA: Diagnosis not present

## 2012-10-25 DIAGNOSIS — M159 Polyosteoarthritis, unspecified: Secondary | ICD-10-CM | POA: Diagnosis not present

## 2012-10-25 DIAGNOSIS — M6281 Muscle weakness (generalized): Secondary | ICD-10-CM | POA: Diagnosis not present

## 2012-10-26 DIAGNOSIS — M6281 Muscle weakness (generalized): Secondary | ICD-10-CM | POA: Diagnosis not present

## 2012-10-26 DIAGNOSIS — M159 Polyosteoarthritis, unspecified: Secondary | ICD-10-CM | POA: Diagnosis not present

## 2012-10-26 DIAGNOSIS — R262 Difficulty in walking, not elsewhere classified: Secondary | ICD-10-CM | POA: Diagnosis not present

## 2012-10-26 LAB — GLUCOSE, CAPILLARY: Glucose-Capillary: 87 mg/dL (ref 70–99)

## 2012-10-29 DIAGNOSIS — R262 Difficulty in walking, not elsewhere classified: Secondary | ICD-10-CM | POA: Diagnosis not present

## 2012-10-29 DIAGNOSIS — M159 Polyosteoarthritis, unspecified: Secondary | ICD-10-CM | POA: Diagnosis not present

## 2012-10-29 DIAGNOSIS — M6281 Muscle weakness (generalized): Secondary | ICD-10-CM | POA: Diagnosis not present

## 2012-10-30 DIAGNOSIS — M6281 Muscle weakness (generalized): Secondary | ICD-10-CM | POA: Diagnosis not present

## 2012-10-30 DIAGNOSIS — M159 Polyosteoarthritis, unspecified: Secondary | ICD-10-CM | POA: Diagnosis not present

## 2012-10-30 DIAGNOSIS — R262 Difficulty in walking, not elsewhere classified: Secondary | ICD-10-CM | POA: Diagnosis not present

## 2012-10-31 DIAGNOSIS — M6281 Muscle weakness (generalized): Secondary | ICD-10-CM | POA: Diagnosis not present

## 2012-10-31 DIAGNOSIS — M159 Polyosteoarthritis, unspecified: Secondary | ICD-10-CM | POA: Diagnosis not present

## 2012-10-31 DIAGNOSIS — R262 Difficulty in walking, not elsewhere classified: Secondary | ICD-10-CM | POA: Diagnosis not present

## 2012-11-01 DIAGNOSIS — R262 Difficulty in walking, not elsewhere classified: Secondary | ICD-10-CM | POA: Diagnosis not present

## 2012-11-01 DIAGNOSIS — M6281 Muscle weakness (generalized): Secondary | ICD-10-CM | POA: Diagnosis not present

## 2012-11-01 DIAGNOSIS — R609 Edema, unspecified: Secondary | ICD-10-CM | POA: Diagnosis not present

## 2012-11-01 DIAGNOSIS — M159 Polyosteoarthritis, unspecified: Secondary | ICD-10-CM | POA: Diagnosis not present

## 2012-11-02 DIAGNOSIS — R0602 Shortness of breath: Secondary | ICD-10-CM | POA: Diagnosis not present

## 2012-11-02 DIAGNOSIS — I10 Essential (primary) hypertension: Secondary | ICD-10-CM | POA: Diagnosis not present

## 2012-11-02 LAB — GLUCOSE, CAPILLARY

## 2012-11-05 DIAGNOSIS — M159 Polyosteoarthritis, unspecified: Secondary | ICD-10-CM | POA: Diagnosis not present

## 2012-11-05 DIAGNOSIS — M6281 Muscle weakness (generalized): Secondary | ICD-10-CM | POA: Diagnosis not present

## 2012-11-05 DIAGNOSIS — R262 Difficulty in walking, not elsewhere classified: Secondary | ICD-10-CM | POA: Diagnosis not present

## 2012-11-05 LAB — GLUCOSE, CAPILLARY: Glucose-Capillary: 86 mg/dL (ref 70–99)

## 2012-11-06 DIAGNOSIS — R262 Difficulty in walking, not elsewhere classified: Secondary | ICD-10-CM | POA: Diagnosis not present

## 2012-11-06 DIAGNOSIS — M6281 Muscle weakness (generalized): Secondary | ICD-10-CM | POA: Diagnosis not present

## 2012-11-06 DIAGNOSIS — M159 Polyosteoarthritis, unspecified: Secondary | ICD-10-CM | POA: Diagnosis not present

## 2012-11-07 DIAGNOSIS — M6281 Muscle weakness (generalized): Secondary | ICD-10-CM | POA: Diagnosis not present

## 2012-11-07 DIAGNOSIS — I10 Essential (primary) hypertension: Secondary | ICD-10-CM | POA: Diagnosis not present

## 2012-11-07 DIAGNOSIS — M159 Polyosteoarthritis, unspecified: Secondary | ICD-10-CM | POA: Diagnosis not present

## 2012-11-07 DIAGNOSIS — R262 Difficulty in walking, not elsewhere classified: Secondary | ICD-10-CM | POA: Diagnosis not present

## 2012-11-07 LAB — GLUCOSE, CAPILLARY: Glucose-Capillary: 90 mg/dL (ref 70–99)

## 2012-11-08 DIAGNOSIS — M159 Polyosteoarthritis, unspecified: Secondary | ICD-10-CM | POA: Diagnosis not present

## 2012-11-08 DIAGNOSIS — R262 Difficulty in walking, not elsewhere classified: Secondary | ICD-10-CM | POA: Diagnosis not present

## 2012-11-08 DIAGNOSIS — M6281 Muscle weakness (generalized): Secondary | ICD-10-CM | POA: Diagnosis not present

## 2012-11-08 DIAGNOSIS — R609 Edema, unspecified: Secondary | ICD-10-CM | POA: Diagnosis not present

## 2012-11-09 DIAGNOSIS — M159 Polyosteoarthritis, unspecified: Secondary | ICD-10-CM | POA: Diagnosis not present

## 2012-11-09 DIAGNOSIS — D649 Anemia, unspecified: Secondary | ICD-10-CM | POA: Diagnosis not present

## 2012-11-09 DIAGNOSIS — M6281 Muscle weakness (generalized): Secondary | ICD-10-CM | POA: Diagnosis not present

## 2012-11-09 DIAGNOSIS — R262 Difficulty in walking, not elsewhere classified: Secondary | ICD-10-CM | POA: Diagnosis not present

## 2012-11-09 LAB — GLUCOSE, CAPILLARY: Glucose-Capillary: 91 mg/dL (ref 70–99)

## 2012-11-12 DIAGNOSIS — M159 Polyosteoarthritis, unspecified: Secondary | ICD-10-CM | POA: Diagnosis not present

## 2012-11-12 DIAGNOSIS — M6281 Muscle weakness (generalized): Secondary | ICD-10-CM | POA: Diagnosis not present

## 2012-11-12 DIAGNOSIS — R609 Edema, unspecified: Secondary | ICD-10-CM | POA: Diagnosis not present

## 2012-11-12 DIAGNOSIS — R262 Difficulty in walking, not elsewhere classified: Secondary | ICD-10-CM | POA: Diagnosis not present

## 2012-11-13 DIAGNOSIS — R262 Difficulty in walking, not elsewhere classified: Secondary | ICD-10-CM | POA: Diagnosis not present

## 2012-11-13 DIAGNOSIS — M159 Polyosteoarthritis, unspecified: Secondary | ICD-10-CM | POA: Diagnosis not present

## 2012-11-13 DIAGNOSIS — M6281 Muscle weakness (generalized): Secondary | ICD-10-CM | POA: Diagnosis not present

## 2012-11-14 DIAGNOSIS — M6281 Muscle weakness (generalized): Secondary | ICD-10-CM | POA: Diagnosis not present

## 2012-11-14 DIAGNOSIS — R262 Difficulty in walking, not elsewhere classified: Secondary | ICD-10-CM | POA: Diagnosis not present

## 2012-11-14 DIAGNOSIS — M159 Polyosteoarthritis, unspecified: Secondary | ICD-10-CM | POA: Diagnosis not present

## 2012-11-14 LAB — GLUCOSE, CAPILLARY

## 2012-11-19 DIAGNOSIS — I10 Essential (primary) hypertension: Secondary | ICD-10-CM | POA: Diagnosis not present

## 2012-11-19 LAB — GLUCOSE, CAPILLARY: Glucose-Capillary: 121 mg/dL — ABNORMAL HIGH (ref 70–99)

## 2012-11-21 LAB — GLUCOSE, CAPILLARY: Glucose-Capillary: 90 mg/dL (ref 70–99)

## 2012-11-28 LAB — GLUCOSE, CAPILLARY

## 2012-11-30 LAB — GLUCOSE, CAPILLARY: Glucose-Capillary: 120 mg/dL — ABNORMAL HIGH (ref 70–99)

## 2012-12-03 LAB — GLUCOSE, CAPILLARY: Glucose-Capillary: 88 mg/dL (ref 70–99)

## 2012-12-05 LAB — GLUCOSE, CAPILLARY: Glucose-Capillary: 74 mg/dL (ref 70–99)

## 2012-12-07 LAB — GLUCOSE, CAPILLARY: Glucose-Capillary: 81 mg/dL (ref 70–99)

## 2012-12-10 LAB — GLUCOSE, CAPILLARY: Glucose-Capillary: 82 mg/dL (ref 70–99)

## 2012-12-14 DIAGNOSIS — F39 Unspecified mood [affective] disorder: Secondary | ICD-10-CM | POA: Diagnosis not present

## 2012-12-14 DIAGNOSIS — F3289 Other specified depressive episodes: Secondary | ICD-10-CM | POA: Diagnosis not present

## 2012-12-14 DIAGNOSIS — F329 Major depressive disorder, single episode, unspecified: Secondary | ICD-10-CM | POA: Diagnosis not present

## 2012-12-14 DIAGNOSIS — F0390 Unspecified dementia without behavioral disturbance: Secondary | ICD-10-CM | POA: Diagnosis not present

## 2012-12-14 DIAGNOSIS — F411 Generalized anxiety disorder: Secondary | ICD-10-CM | POA: Diagnosis not present

## 2012-12-16 ENCOUNTER — Ambulatory Visit (HOSPITAL_COMMUNITY): Payer: Medicare Other

## 2012-12-16 ENCOUNTER — Ambulatory Visit (HOSPITAL_COMMUNITY): Payer: Medicare Other | Attending: Internal Medicine

## 2012-12-16 DIAGNOSIS — S0083XA Contusion of other part of head, initial encounter: Secondary | ICD-10-CM | POA: Diagnosis not present

## 2012-12-16 DIAGNOSIS — S4980XA Other specified injuries of shoulder and upper arm, unspecified arm, initial encounter: Secondary | ICD-10-CM | POA: Diagnosis not present

## 2012-12-16 DIAGNOSIS — S1093XA Contusion of unspecified part of neck, initial encounter: Secondary | ICD-10-CM | POA: Diagnosis not present

## 2012-12-16 DIAGNOSIS — S46909A Unspecified injury of unspecified muscle, fascia and tendon at shoulder and upper arm level, unspecified arm, initial encounter: Secondary | ICD-10-CM | POA: Diagnosis not present

## 2012-12-16 DIAGNOSIS — M25519 Pain in unspecified shoulder: Secondary | ICD-10-CM | POA: Insufficient documentation

## 2012-12-16 DIAGNOSIS — W19XXXA Unspecified fall, initial encounter: Secondary | ICD-10-CM | POA: Insufficient documentation

## 2012-12-16 DIAGNOSIS — S0003XA Contusion of scalp, initial encounter: Secondary | ICD-10-CM | POA: Insufficient documentation

## 2012-12-19 LAB — GLUCOSE, CAPILLARY

## 2012-12-21 LAB — GLUCOSE, CAPILLARY: Glucose-Capillary: 88 mg/dL (ref 70–99)

## 2012-12-24 LAB — GLUCOSE, CAPILLARY: Glucose-Capillary: 71 mg/dL (ref 70–99)

## 2012-12-26 LAB — GLUCOSE, CAPILLARY: Glucose-Capillary: 78 mg/dL (ref 70–99)

## 2013-01-04 LAB — GLUCOSE, CAPILLARY: Glucose-Capillary: 84 mg/dL (ref 70–99)

## 2013-01-05 IMAGING — CR DG CHEST 1V
1 series · 1 of 1 positions shown · non-contrast
Comparison: 11/15/2011

CLINICAL DATA: Shortness of breath.  Leg swelling.

CHEST - 1 VIEW

[view not recorded]
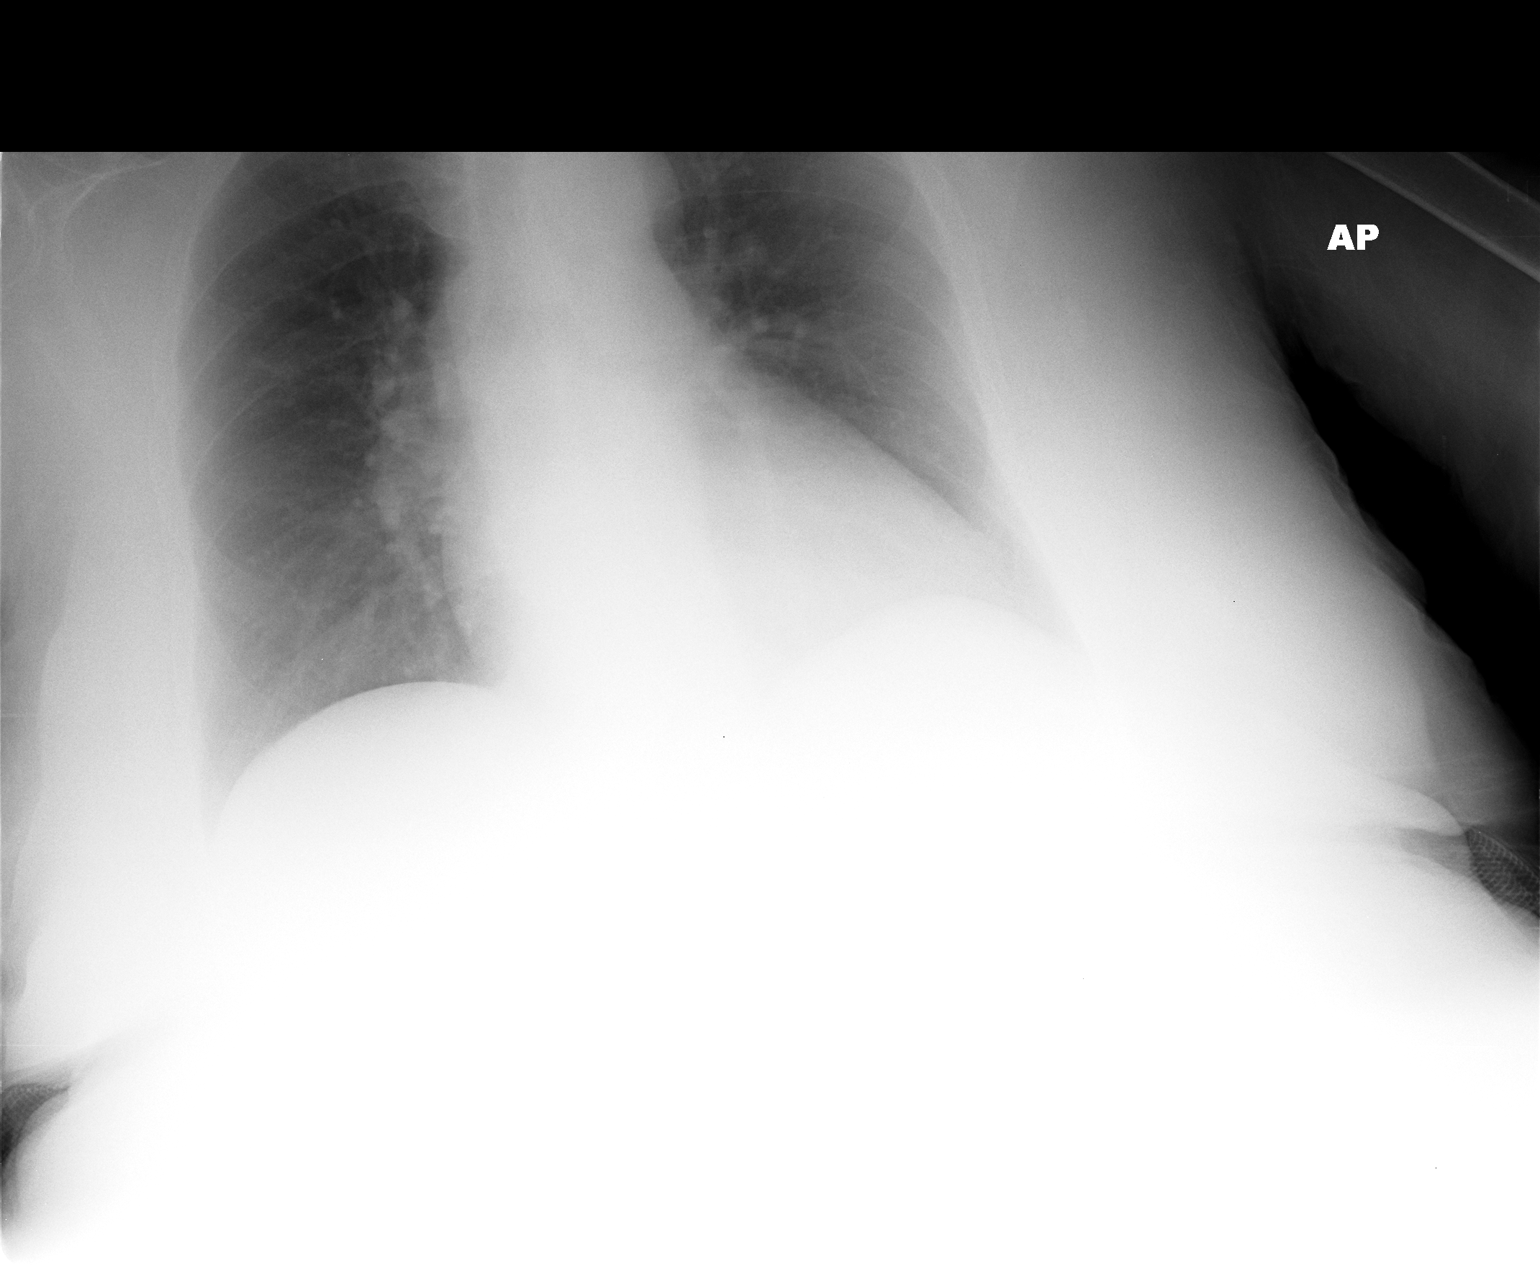

[1 of 1 positions shown; findings below may reference images not displayed]

FINDINGS: Heart size and pulmonary vascularity are normal and the
lungs are clear.  No visible osseous abnormality.
IMPRESSION: No acute disease in the chest.

## 2013-01-07 LAB — GLUCOSE, CAPILLARY: Glucose-Capillary: 80 mg/dL (ref 70–99)

## 2013-01-08 ENCOUNTER — Non-Acute Institutional Stay (SKILLED_NURSING_FACILITY): Payer: Medicare Other | Admitting: Internal Medicine

## 2013-01-08 ENCOUNTER — Ambulatory Visit (HOSPITAL_COMMUNITY)
Admit: 2013-01-08 | Discharge: 2013-01-08 | Disposition: A | Discharge status: Home / Self Care | Payer: Medicare Other | Attending: Internal Medicine | Admitting: Internal Medicine

## 2013-01-08 DIAGNOSIS — R609 Edema, unspecified: Secondary | ICD-10-CM

## 2013-01-08 DIAGNOSIS — K219 Gastro-esophageal reflux disease without esophagitis: Secondary | ICD-10-CM | POA: Diagnosis not present

## 2013-01-08 DIAGNOSIS — R059 Cough, unspecified: Secondary | ICD-10-CM

## 2013-01-08 DIAGNOSIS — R0989 Other specified symptoms and signs involving the circulatory and respiratory systems: Secondary | ICD-10-CM | POA: Diagnosis not present

## 2013-01-08 DIAGNOSIS — R05 Cough: Secondary | ICD-10-CM | POA: Insufficient documentation

## 2013-01-08 DIAGNOSIS — I509 Heart failure, unspecified: Secondary | ICD-10-CM | POA: Diagnosis not present

## 2013-01-08 DIAGNOSIS — R062 Wheezing: Secondary | ICD-10-CM | POA: Insufficient documentation

## 2013-01-08 DIAGNOSIS — J029 Acute pharyngitis, unspecified: Secondary | ICD-10-CM

## 2013-01-08 DIAGNOSIS — E785 Hyperlipidemia, unspecified: Secondary | ICD-10-CM

## 2013-01-08 DIAGNOSIS — M1990 Unspecified osteoarthritis, unspecified site: Secondary | ICD-10-CM

## 2013-01-08 DIAGNOSIS — N289 Disorder of kidney and ureter, unspecified: Secondary | ICD-10-CM

## 2013-01-08 DIAGNOSIS — J3489 Other specified disorders of nose and nasal sinuses: Secondary | ICD-10-CM | POA: Diagnosis not present

## 2013-01-08 DIAGNOSIS — I10 Essential (primary) hypertension: Secondary | ICD-10-CM

## 2013-01-08 DIAGNOSIS — F068 Other specified mental disorders due to known physiological condition: Secondary | ICD-10-CM | POA: Diagnosis not present

## 2013-01-08 NOTE — Progress Notes (Signed)
Patient ID: [name], female   DOB: February 09, 1922, 79 y.o.   MRN: [id number] Chief complaint-medical management of dementia renal insufficiency dysphagia hyperlipidemia depression-acute visit secondary to cough congestion.  History of present illness.  Patient is a pleasant elderly resident with the above diagnoses.  She's been relatively stable.  She did sustain a fall recently and hit her head-CT scan did show a hematoma above her frontal bone-this is slowly resolving.  She also had increased nasal and chest congestion yesterday-a chest x-ray did not really show any acute process she has been started on Mucinex and duo nebs have been made routine as well-.  Her weight continues to be stable she does have a history of edema to her legs this has been relatively stable recently she is on Lasix.  Anxiety also appears to be relatively stable as well as depressive symptoms-she is on Celexa.--Also was on Depakote as a mood stabilizer which appears to help.  She continues to have variable blood pressures but I do not see consistent systolic elevations most recent blood pressures systolics in the 130s diastolics in the 70s.  . Family medical social history has been reviewed her history and physical on 02/07/2011.  Medications reviewed.  Review of systems.  In general denies any fever or chills.  Skin denies tissue she does have a healing area on her lower left leg this appeared to be dried and crusted.  Head ears eyes nose mouth and throat-does complain of some nasal congestion nasal drainage as well as a sore throat denies any visual changes.  Respiratory-complains of bit of shortness of breath at times with the cough.  Cardiac-has some chronic lower extremity edema denies any chest pain or palpitations.  GI-no complaints of nausea vomiting diarrhea or constipation.  Musculoskeletal-does have a history of back pain and osteoarthritis but this is controlled apparently on  Tylenol.  Neurologic-does not complaining of headache or dizziness.  Psych-does have a history of anxiety depression and mood disorder.  This has been stable recently.  Physical exam.  Temperature is 97.6 pulse 70 respirations 18 blood pressures as stated in history of present illness weight is stable at 193.8.  In general this is a pleasant elderly female in no distress sitting comfortably in her wheelchair.  Her skin is warm and dry.  She does have some crusting of the left lower leg wound.  Eyes-pupils appear equal round reactive light sclerae and conjunctivae were clear.  Nose she does have some mucoid discharge relatively clear.  Oropharynx is clear mucous membranes moist I do not really note any increased erythema.  Chest she does have some diffuse coarse breath sounds no labored breathing.  Heart is regular rate and rhythm she has her baseline chronic lower extremity edema I say 2+.  Abdomen obese soft nontender with positive bowel sounds.  Muscle skeletal moves all extremities x4 at baseline does ambulate in a wheelchair I do not note any deformities.  Neurologic-appears grossly intact I do not see a focal weaknesses or lateralizing findings.  Labs.  11/19/2012 Sodium 139 potassium 4.1 BUN 32 creatinine 1.35.  Liver function tests within normal limits except alkaline phosphatase of 129 an albumin of 3 point.  11/02/2012.  BnP-254.4.  09/10/2012.  W BC 5.0 hemoglobin 11.7 platelets 151.  Valproic acid level-54.4.  07/09/2012.  Cholesterol 226 triglycerides 72 HDL 118 LDL 94  Assessment and plan.  Number 1-- cough/congestion-chest x-ray was benign-will make duo nebs routine for 2 additional days secondary to the chest congestion-also  will extend Mucinex to a seven-day course-in regards to sore throat will prescribe Chloraseptic lozenges  #2-dementia-this appears to be relatively mild she continues on Aricept with Depakote as a mood stabilizer.  #3  CHF-this appears to be stable is on Lasix -- edema appears to be at baseline.  #4-GERD this appears to be stable proptonix twice a day.  #5 renal insufficiency-this appears to be relatively stable we'll update CMP.  #6 hyperlipidemia-this appears stable Mevacor-we'll update lipid panel and liver function tests.  #7 hypertension continues on Lotensin as well as Lasix somewhat variable systolics but I do not see consistent elevations continue to monitor.  #8-depression this appears stable on Celexa.  #9-anemia-will update CBC suspect there is an element of chronic disease here.  [id number] note greater than 30 minutes spent assessing patient-reviewing her concerns with patient and nursing staff-and formulating plan of care for numerous diagnoses

## 2013-01-09 DIAGNOSIS — D649 Anemia, unspecified: Secondary | ICD-10-CM | POA: Diagnosis not present

## 2013-01-09 DIAGNOSIS — Z79899 Other long term (current) drug therapy: Secondary | ICD-10-CM | POA: Diagnosis not present

## 2013-01-09 LAB — GLUCOSE, CAPILLARY: Glucose-Capillary: 87 mg/dL (ref 70–99)

## 2013-01-16 LAB — GLUCOSE, CAPILLARY: Glucose-Capillary: 113 mg/dL — ABNORMAL HIGH (ref 70–99)

## 2013-01-18 LAB — GLUCOSE, CAPILLARY

## 2013-01-21 LAB — GLUCOSE, CAPILLARY: Glucose-Capillary: 81 mg/dL (ref 70–99)

## 2013-01-23 LAB — GLUCOSE, CAPILLARY

## 2013-01-28 LAB — GLUCOSE, CAPILLARY

## 2013-01-30 LAB — GLUCOSE, CAPILLARY: Glucose-Capillary: 96 mg/dL (ref 70–99)

## 2013-02-01 LAB — GLUCOSE, CAPILLARY

## 2013-02-04 LAB — GLUCOSE, CAPILLARY: Glucose-Capillary: 86 mg/dL (ref 70–99)

## 2013-02-13 LAB — GLUCOSE, CAPILLARY: Glucose-Capillary: 93 mg/dL (ref 70–99)

## 2013-02-18 LAB — GLUCOSE, CAPILLARY: Glucose-Capillary: 97 mg/dL (ref 70–99)

## 2013-02-20 LAB — GLUCOSE, CAPILLARY: Glucose-Capillary: 116 mg/dL — ABNORMAL HIGH (ref 70–99)

## 2013-02-27 LAB — GLUCOSE, CAPILLARY

## 2013-03-01 LAB — GLUCOSE, CAPILLARY: Glucose-Capillary: 83 mg/dL (ref 70–99)

## 2013-03-04 DIAGNOSIS — B351 Tinea unguium: Secondary | ICD-10-CM | POA: Diagnosis not present

## 2013-03-04 DIAGNOSIS — R609 Edema, unspecified: Secondary | ICD-10-CM | POA: Diagnosis not present

## 2013-03-04 LAB — GLUCOSE, CAPILLARY: Glucose-Capillary: 85 mg/dL (ref 70–99)

## 2013-03-06 LAB — GLUCOSE, CAPILLARY: Glucose-Capillary: 93 mg/dL (ref 70–99)

## 2013-03-11 LAB — GLUCOSE, CAPILLARY: Glucose-Capillary: 88 mg/dL (ref 70–99)

## 2013-03-12 DIAGNOSIS — F0390 Unspecified dementia without behavioral disturbance: Secondary | ICD-10-CM | POA: Diagnosis not present

## 2013-03-13 LAB — GLUCOSE, CAPILLARY

## 2013-03-15 LAB — GLUCOSE, CAPILLARY: Glucose-Capillary: 95 mg/dL (ref 70–99)

## 2013-03-18 LAB — GLUCOSE, CAPILLARY: Glucose-Capillary: 89 mg/dL (ref 70–99)

## 2013-03-20 LAB — GLUCOSE, CAPILLARY: Glucose-Capillary: 87 mg/dL (ref 70–99)

## 2013-03-26 ENCOUNTER — Non-Acute Institutional Stay (SKILLED_NURSING_FACILITY): Payer: Medicare Other | Admitting: Internal Medicine

## 2013-03-26 DIAGNOSIS — R609 Edema, unspecified: Secondary | ICD-10-CM

## 2013-03-26 DIAGNOSIS — K219 Gastro-esophageal reflux disease without esophagitis: Secondary | ICD-10-CM

## 2013-03-26 DIAGNOSIS — M109 Gout, unspecified: Secondary | ICD-10-CM

## 2013-03-26 DIAGNOSIS — E785 Hyperlipidemia, unspecified: Secondary | ICD-10-CM

## 2013-03-26 DIAGNOSIS — R059 Cough, unspecified: Secondary | ICD-10-CM

## 2013-03-26 DIAGNOSIS — N289 Disorder of kidney and ureter, unspecified: Secondary | ICD-10-CM | POA: Diagnosis not present

## 2013-03-26 DIAGNOSIS — R05 Cough: Secondary | ICD-10-CM

## 2013-03-26 DIAGNOSIS — F411 Generalized anxiety disorder: Secondary | ICD-10-CM

## 2013-03-26 DIAGNOSIS — F068 Other specified mental disorders due to known physiological condition: Secondary | ICD-10-CM | POA: Diagnosis not present

## 2013-03-26 DIAGNOSIS — M1990 Unspecified osteoarthritis, unspecified site: Secondary | ICD-10-CM

## 2013-03-26 DIAGNOSIS — F329 Major depressive disorder, single episode, unspecified: Secondary | ICD-10-CM

## 2013-03-26 DIAGNOSIS — F3289 Other specified depressive episodes: Secondary | ICD-10-CM

## 2013-03-26 DIAGNOSIS — I10 Essential (primary) hypertension: Secondary | ICD-10-CM

## 2013-03-26 NOTE — Progress Notes (Signed)
Patient ID: [name], female   DOB: [birth year], 79 y.o.   MRN: [id number] This is a routine visit.  LOC-skilled.  Facility [facility]    Chief complaint-medical management of dementia renal insufficiency dysphagia hyperlipidemia depression-   History of present illness.  Patient is a pleasant elderly resident with the above diagnoses.  She's been relatively stable.  She did sustain a fall earlier this year and hit her head-CT scan did show a hematoma above her frontal bone--this has resolved.   She does have an occasional cough she has benefited in the past from Tessalon perles these have been discontinued-she would like be started when necessary again she said she did benefit from them .  Her weight continues to be relatively stable she does have a history of edema to her legs this has been relatively stable recently she is on Lasix .  Anxiety also appears to be relatively stable as well as depressive symptoms-she is on Celexa.--Also was on Depakote as a mood stabilizer which appears to help.  She continues to have variable blood pressure--ranging from 128/86-to systolics in the 150s I see one 181/83 but this is very rare-- .  Marland Kitchen Family medical social history has been reviewed per history and physical on 02/07/2011 .  Medications reviewed per MAR .  Review of systems.  In general denies any fever or chills.   .  Head ears eyes nose mouth and throat-is not complaining of sore throat rash or visual changes or nasal discharge does have some history of allergies .  Respiratory. --Has occasional cough but this has not worsened-denies any shortness of breath today Cardiac-has some chronic lower extremity edema denies any chest pain or palpitations.  GI-no complaints of nausea vomiting diarrhea or constipation. --She thinks her umbilicus is protruding some-- Musculoskeletal-does have a history of back pain and osteoarthritis but this is controlled apparently on Tylenol.  Neurologic-does  not complaining of headache or dizziness.  Psych-does have a history of anxiety depression and mood disorder.  This has been stable recently.   Physical exam Temperature 97.5 pulse 69 respirations 19 blood pressure is stated in history of present illness-weight is 197.4 this is been relatively stable the past month looks like she's gained about 4 pounds over the past several months.     In general this is a pleasant elderly female in no distress sitting comfortably in her wheelchair.  Her skin is warm and dry.  He has a small left leg wound  currently covered- this is stable per nursing staff.  Eyes-pupils appear equal round reactive light sclerae and conjunctivae were clear.  Nose I do not note any drainage.  Oropharynx is clear mucous membranes moist I do not really note any increased erythema.  Chest --clear to auscultation-somewhat shallow air entry-- no labored breathing.  Heart is regular rate and rhythm she has her baseline chronic lower extremity edema I say 2+.  Abdomen obese soft nontender with positive bowel sounds== naval area appears unremarkable I do not note any significant protrusion or pain or firmness around it.  Muscle skeletal moves all extremities x4 at baseline does ambulate in a wheelchair I do not note any deformities.  Neurologic-appears grossly intact I do not see a focal weaknesses or lateralizing findings .  Labs 01/09/2013.  WBC 5.4 hemoglobin 11.4 platelets 194.  Sodium 140 potassium 4 BUN 31 creatinine 1.42.  Bilirubin 0.2 alkaline phosphatase 129 albumin 3.2 otherwise liver function tests within normal limits.  Marland Kitchen   11/19/2012  Sodium 139 potassium 4.1 BUN 32 creatinine 1.35.  Liver function tests within normal limits except alkaline phosphatase of 129 an albumin of 3 point.  11/02/2012.  BnP-254.4.  09/10/2012.  W BC 5.0 hemoglobin 11.7 platelets 151.  Valproic acid level-54.4.  07/09/2012.  Cholesterol 226 triglycerides 72 HDL 118 LDL 94    Assessment and plan.  Number 1-- cough/--we'll restart Tessalon pearls when necessary-I do not see any significant increasing cough or chest congestion on exam patient denies any increased shortness of breath  #2-dementia-this appears to be relatively mild she continues on Aricept with Depakote as a mood stabilizer. We'll update Depakote level--  #3 CHF--edema---this appears to be stable is on Lasix -- edema appears to be at baseline.--We'll update metabolic  panel  #8-IONG this appears to be stable proptonix twice a day.  #5 renal insufficiency-this appears to be relatively stable we'll update CMP.  #6 hyperlipidemia-this appears stable Mevacor-we'll update lipid panel and liver function tests.  #7 hypertension continues on Lotensin as well as Lasix somewhat variable systolics but I do not  see consistent elevations continue to monitor--.  #8-depression this appears stable on Celexa.  #9-anemia-will update CBC suspect there is an element of chronic disease here #10-umbilicus issues-I cannot really note anything on exam however continue to monitor this .  [id number] note greater than 30 minutes spent assessing patient-reviewing her concerns with patient and nursing staff-and formulating plan of care for numerous diagnoses

## 2013-03-27 DIAGNOSIS — E785 Hyperlipidemia, unspecified: Secondary | ICD-10-CM | POA: Diagnosis not present

## 2013-03-27 LAB — GLUCOSE, CAPILLARY

## 2013-04-03 LAB — GLUCOSE, CAPILLARY

## 2013-04-05 LAB — GLUCOSE, CAPILLARY: Glucose-Capillary: 95 mg/dL (ref 70–99)

## 2013-04-10 DIAGNOSIS — F0390 Unspecified dementia without behavioral disturbance: Secondary | ICD-10-CM | POA: Diagnosis not present

## 2013-04-12 LAB — GLUCOSE, CAPILLARY

## 2013-04-15 LAB — GLUCOSE, CAPILLARY: Glucose-Capillary: 85 mg/dL (ref 70–99)

## 2013-04-17 LAB — GLUCOSE, CAPILLARY: Glucose-Capillary: 97 mg/dL (ref 70–99)

## 2013-04-26 LAB — GLUCOSE, CAPILLARY: Glucose-Capillary: 101 mg/dL — ABNORMAL HIGH (ref 70–99)

## 2013-04-29 LAB — GLUCOSE, CAPILLARY

## 2013-05-01 LAB — GLUCOSE, CAPILLARY: Glucose-Capillary: 83 mg/dL (ref 70–99)

## 2013-05-08 LAB — GLUCOSE, CAPILLARY

## 2013-05-10 DIAGNOSIS — F0390 Unspecified dementia without behavioral disturbance: Secondary | ICD-10-CM | POA: Diagnosis not present

## 2013-05-10 LAB — GLUCOSE, CAPILLARY: Glucose-Capillary: 80 mg/dL (ref 70–99)

## 2013-05-13 DIAGNOSIS — B351 Tinea unguium: Secondary | ICD-10-CM | POA: Diagnosis not present

## 2013-05-13 LAB — GLUCOSE, CAPILLARY: Glucose-Capillary: 85 mg/dL (ref 70–99)

## 2013-05-15 LAB — GLUCOSE, CAPILLARY: Glucose-Capillary: 98 mg/dL (ref 70–99)

## 2013-05-16 ENCOUNTER — Non-Acute Institutional Stay (SKILLED_NURSING_FACILITY): Payer: Medicare Other | Admitting: Internal Medicine

## 2013-05-16 DIAGNOSIS — N289 Disorder of kidney and ureter, unspecified: Secondary | ICD-10-CM | POA: Diagnosis not present

## 2013-05-16 DIAGNOSIS — F068 Other specified mental disorders due to known physiological condition: Secondary | ICD-10-CM

## 2013-05-16 DIAGNOSIS — J309 Allergic rhinitis, unspecified: Secondary | ICD-10-CM | POA: Diagnosis not present

## 2013-05-16 DIAGNOSIS — R609 Edema, unspecified: Secondary | ICD-10-CM

## 2013-05-16 DIAGNOSIS — E785 Hyperlipidemia, unspecified: Secondary | ICD-10-CM

## 2013-05-16 DIAGNOSIS — F411 Generalized anxiety disorder: Secondary | ICD-10-CM

## 2013-05-16 DIAGNOSIS — K219 Gastro-esophageal reflux disease without esophagitis: Secondary | ICD-10-CM

## 2013-05-16 DIAGNOSIS — I10 Essential (primary) hypertension: Secondary | ICD-10-CM | POA: Diagnosis not present

## 2013-05-16 NOTE — Progress Notes (Signed)
Patient ID: [name], female   DOB: Sep 21, 1921, 79 y.o.   MRN: [id number] This is a routine visit.  LOC-skilled.  Facility [facility]   Chief complaint-medical management of dementia renal insufficiency dysphagia hyperlipidemia depression- acute visit secondary to allergy symptoms  History of present illness.  Patient is a pleasant elderly resident with the above diagnoses.  She's been relatively stable Today she is having some nasal congestion and clear drainage this appears to be allergies she does have a history of this.  She also apparently has some increased edema which she has at times as well initially responds to an increased dose of Lasix.      [name]  Anxiety also appears to be relatively stable as well as depressive symptoms-she is on Celexa.--Also was on Depakote as a mood stabilizer which appears to help.  She continues to have variable blood pressure--ranging from 114/59-180/82-I do not see consistent elevations but we will have to keep an eye on this --she does get anxious at times and I think this may be contributing to the higher readings at times-  .  [name] Family medical social history has been reviewed per history and physical on 02/07/2011  .  Medications reviewed per MAR  .  Review of systems.  In general denies any fever or chills.  .  Head ears eyes nose mouth and throat-is having some eye and nasal drainage is not really complaining of sore throat although apparently is having some drainage here i   .  Respiratory. --Has occasional cough but this has not worsened-sometimes will complain of shortness of breath but says this has not really changed  Cardiac-has some chronic lower extremity edema--appears to be mildly increased denies any chest pain or palpitations.  GI-no complaints of nausea vomiting diarrhea or constipation. --  Musculoskeletal-does have a history of back pain and osteoarthritis but this is controlled apparently on Tylenol.  Neurologic-does not  complaining of headache or dizziness.  Psych-does have a history of anxiety depression and mood disorder.  This has been stable recently.   Physical exam   Temperature is 98.2 pulse 65 respirations 20 blood pressure as noted in history of present illness weight is stable at 199.4.  In general this is a pleasant elderly female in no distress sitting comfortably in her wheelchair.  Her skin is warm and dry.  The wound that was on her left lower leg appears to be resolved Eyes-pupils appear equal round reactive light --she has some clear drainage from her eyes as well as her nose.  Oropharynx is clear mucous membranes moist I do note some clear drainage  Chest --clear to auscultation-somewhat shallow air entry-- no labored breathing.  Heart is regular rate and rhythm she has her baseline chronic lower extremity edema I say 2+. Slightly increased from previous exam  Abdomen obese soft nontender with positive bowel sounds==.  Muscle skeletal moves all extremities x4 at baseline does ambulate in a wheelchair I do not note any deformities.  Neurologic-appears grossly intact I do not see a focal weaknesses or lateralizing findings  .  Labs 03/27/2013.  Cholesterol 254 triglycerides 94-HDL 120-LDL 1:15 3  WBC 4.5 hemoglobin 11.9 platelets 175.  Sodium 140 potassium 4.2 BUN 42 creatinine 1.30.  Liver function tests within normal limits except alkaline phosphatase 156 and albumin of 3.4.  Depakote level was 44.2.    01/09/2013.  WBC 5.4 hemoglobin 11.4 platelets 194.  Sodium 140 potassium 4 BUN 31 creatinine 1.42.  Bilirubin 0.2 alkaline phosphatase  129 albumin 3.2 otherwise liver function tests within normal limits.  [name]  11/19/2012  Sodium 139 potassium 4.1 BUN 32 creatinine 1.35.  Liver function tests within normal limits except alkaline phosphatase of 129 an albumin of 3 point.  11/02/2012.  BnP-254.4.  09/10/2012.  W BC 5.0 hemoglobin 11.7 platelets 151.  Valproic acid level-54.4.   07/09/2012.  Cholesterol 226 triglycerides 72 HDL 118 LDL 94   Assessment and plan.  Number 1-- allergy symptoms-will treat with Flonase one spray each nostril twice a day for 7 days and monitor--vital signs twice a day for 3 days with pulse ox   #2-dementia-this appears to be relatively mild she continues on Aricept with Depakote as a mood stabilizer. W-  #3 CHF--edema---this appears to be slightly increased-will increase her Lasix to 40 mg a day for 3 days and increase the potassium to 20 mEq a day for 3 additional days and then reduce back to baseline of 20 mg Lasix and 10 mEq of potassium will update a metabolic panel as well  #4-GERD this appears to be stable proptonix twice a day.  #5 renal insufficiency-this appears to be relatively stable we'll update BMP.  #6 hyperlipidemia-this appears stable  On   Mevacor-.  #7 hypertension continues on Lotensin as well as Lasix somewhat variable systolics but I do not  see consistent elevations continue to monitor--. Have ordered vital signs twice a day for 3 days we'll see what this tells [place]  #8-depression this appears stable on Celexa.  #9-anemia-this appears stable per recent CBC suspect there is an element of chronic disease here     .  [id number] note greater than 30 minutes spent assessing patient-reviewing  concerns with patient and nursing staff-and formulating plan of care for numerous diagnoses

## 2013-05-17 ENCOUNTER — Non-Acute Institutional Stay (SKILLED_NURSING_FACILITY): Payer: Medicare Other | Admitting: Internal Medicine

## 2013-05-17 DIAGNOSIS — R609 Edema, unspecified: Secondary | ICD-10-CM

## 2013-05-17 DIAGNOSIS — J309 Allergic rhinitis, unspecified: Secondary | ICD-10-CM

## 2013-05-17 NOTE — Progress Notes (Signed)
Patient ID: [name], female   DOB: 01-21-1922, 79 y.o.   MRN: [id number] This is an acute visit.  Level of care is ill.  Facility [facility].  Chief complaint-acute visit followup allergic rhinitis leg edema.  History of present illness.  Studies a pleasant elderly resident who I saw yesterday for what appear to be allergic rhinitis and nasal congestion as well as some mild increased leg edema I am following up on this.  We did start Flonase as well as increased her Lasix slightly for 3 days from 20 mg to 40 mg.  She says she feels much better today the edema appears to be stable says her breathing and congestion has improved her vital signs are stable she has been afebrile.  Physical exam.  Temperature is 97.9 pulse 70 respirations 20.--Weight is 199.6  In general this is a somewhat obese ill female in no distress appears more comfortable today sitting in her wheelchair.  Her skin is warm and dry.  Eyes has minimal drainage this is improved from yesterday.  Nose-did not note any drainage today.  Chest is clear to auscultation without any labored breathing.  Edema appears to be relatively baseline compared to yesterday certainly not increased.   Labs.  Update labs are pending  Life 32,014.  WBC 4.5 hemoglobin 11.9 platelets 175.  Sodium 140 potassium 4.2 BUN 42 creatinine 1.30.  Assessment and plan.  #1-allergic rhinitis-this appears to be improving she is on Flonase date 77.  #2-history of edema-Lasix again has just been increased-weight appears to be relatively stable update labs are pending she does have some history of renal insufficiency but clinically appears stable .  [id number]

## 2013-05-20 LAB — GLUCOSE, CAPILLARY: Glucose-Capillary: 85 mg/dL (ref 70–99)

## 2013-05-22 LAB — GLUCOSE, CAPILLARY: Glucose-Capillary: 119 mg/dL — ABNORMAL HIGH (ref 70–99)

## 2013-05-23 DIAGNOSIS — I10 Essential (primary) hypertension: Secondary | ICD-10-CM | POA: Diagnosis not present

## 2013-05-24 LAB — GLUCOSE, CAPILLARY: Glucose-Capillary: 97 mg/dL (ref 70–99)

## 2013-05-29 LAB — GLUCOSE, CAPILLARY: Glucose-Capillary: 84 mg/dL (ref 70–99)

## 2013-05-31 ENCOUNTER — Other Ambulatory Visit: Payer: Self-pay

## 2013-05-31 MED ORDER — BENZONATATE 100 MG PO CAPS: 100.0000 mg | ORAL_CAPSULE | Freq: Two times a day (BID) | ORAL | Status: DC | PRN

## 2013-05-31 NOTE — Telephone Encounter (Signed)
Verified dose and instructions reflect manual request received by nursing home.   

## 2013-06-04 ENCOUNTER — Non-Acute Institutional Stay (SKILLED_NURSING_FACILITY): Payer: Medicare Other | Admitting: Internal Medicine

## 2013-06-04 DIAGNOSIS — N289 Disorder of kidney and ureter, unspecified: Secondary | ICD-10-CM | POA: Diagnosis not present

## 2013-06-04 DIAGNOSIS — R609 Edema, unspecified: Secondary | ICD-10-CM | POA: Diagnosis not present

## 2013-06-04 NOTE — Progress Notes (Signed)
Patient ID: [name], female   DOB: July 29, 1922, 79 y.o.   MRN: [id number] this is an acute Acute visit.  Level care skilled.  Facility [facility].  The date is 04/04/2013.  Chief complaint-acute visit secondary weight gain.  History of present illness.  Patient is a pleasant elderly resident does have some history of leg edema in the past-she is on Lasix 20 mg a day we have increased his sporadically to 40 mg a day for some increased edema at times and this appears to help.  We have done BNP is in the past which have not been very remarkable.  Apparently today she has some weight gain she did recently receive a few days of increased Lasix and apparently this had a beneficial effect.  Weight today is 201.2 on October 4 post Lasix  increase she was 199-appears before the Lasix was increased on September 22 her weight was 202    family medical social history reviewed per history and physical on 02/07/1999 well.  Medications have been reviewed per MAR.  Review of systems.  In general denies fever or chills.  Respiratory he does not really complain of any increased shortness of breath or cough.  Cardiac no complaints of chest pain does have some chronic lower extremity edema this appears possibly slightly increased although not grossly so.  GI-does not complaining of any abdominal pain nausea or vomiting  .  Physical exam.  Temperature is 98.3 pulse 80 respirations 20 blood pressure 137/55 her weight is 201.2 this appears to be relatively stable.  In general this is an elderly female in no distress sitting comfortably in her wheelchair  Her skin is warm and dry.  Chest is clear to auscultation without any labored breathing.   I did not hear any wheezing today.  Heart is regular rate and rhythm without murmur gallop or rub.  Abdomen obese soft nontender positive bowel sounds.  Extremities edema appears to be roughly baseline possibly slightly increased from  her baseline  but not grossly so I would say 2+.     .  Labs.  05/23/2013.  Sodium 141 potassium 4.2 BUN 36 creatinine 1.33.  Assessment and plan.  #1-edema-at appears  In the past  this has improved with the increased Lasix dose we have been trying to be judicious in this with her history of renal insufficiency will increase her Lasix for somewhat longer course of 5 days here and increase her potassium correspondingly-update labs later in the week and continue to monitor-clinically she appears to be stable.--Of note also will update a BNP when we get the BMP later this week--also will update BMP next week--continue to monitor weights  CPT code 78295

## 2013-06-05 DIAGNOSIS — I10 Essential (primary) hypertension: Secondary | ICD-10-CM | POA: Diagnosis not present

## 2013-06-10 DIAGNOSIS — I10 Essential (primary) hypertension: Secondary | ICD-10-CM | POA: Diagnosis not present

## 2013-07-01 DIAGNOSIS — F3289 Other specified depressive episodes: Secondary | ICD-10-CM | POA: Diagnosis not present

## 2013-07-01 DIAGNOSIS — F411 Generalized anxiety disorder: Secondary | ICD-10-CM | POA: Diagnosis not present

## 2013-07-01 DIAGNOSIS — F0390 Unspecified dementia without behavioral disturbance: Secondary | ICD-10-CM | POA: Diagnosis not present

## 2013-07-01 DIAGNOSIS — F39 Unspecified mood [affective] disorder: Secondary | ICD-10-CM | POA: Diagnosis not present

## 2013-07-01 DIAGNOSIS — F329 Major depressive disorder, single episode, unspecified: Secondary | ICD-10-CM | POA: Diagnosis not present

## 2013-07-03 ENCOUNTER — Other Ambulatory Visit: Payer: Self-pay | Admitting: *Deleted

## 2013-07-03 MED ORDER — BENZONATATE 100 MG PO CAPS: 100.0000 mg | ORAL_CAPSULE | Freq: Two times a day (BID) | ORAL | Status: DC | PRN

## 2013-07-18 LAB — GLUCOSE, CAPILLARY

## 2013-07-24 DIAGNOSIS — B351 Tinea unguium: Secondary | ICD-10-CM | POA: Diagnosis not present

## 2013-07-24 DIAGNOSIS — D689 Coagulation defect, unspecified: Secondary | ICD-10-CM | POA: Diagnosis not present

## 2013-08-30 ENCOUNTER — Other Ambulatory Visit: Payer: Self-pay | Admitting: *Deleted

## 2013-08-30 MED ORDER — BENZONATATE 100 MG PO CAPS: 100.0000 mg | ORAL_CAPSULE | Freq: Two times a day (BID) | ORAL | Status: DC | PRN

## 2013-09-17 ENCOUNTER — Non-Acute Institutional Stay (SKILLED_NURSING_FACILITY): Payer: Medicare Other | Admitting: Internal Medicine

## 2013-09-17 DIAGNOSIS — K219 Gastro-esophageal reflux disease without esophagitis: Secondary | ICD-10-CM | POA: Diagnosis not present

## 2013-09-17 DIAGNOSIS — I10 Essential (primary) hypertension: Secondary | ICD-10-CM

## 2013-09-17 DIAGNOSIS — F068 Other specified mental disorders due to known physiological condition: Secondary | ICD-10-CM

## 2013-09-17 DIAGNOSIS — J309 Allergic rhinitis, unspecified: Secondary | ICD-10-CM

## 2013-09-17 DIAGNOSIS — N289 Disorder of kidney and ureter, unspecified: Secondary | ICD-10-CM

## 2013-09-17 DIAGNOSIS — F329 Major depressive disorder, single episode, unspecified: Secondary | ICD-10-CM

## 2013-09-17 DIAGNOSIS — E785 Hyperlipidemia, unspecified: Secondary | ICD-10-CM

## 2013-09-17 DIAGNOSIS — R609 Edema, unspecified: Secondary | ICD-10-CM

## 2013-09-17 DIAGNOSIS — F3289 Other specified depressive episodes: Secondary | ICD-10-CM

## 2013-09-17 NOTE — Progress Notes (Signed)
Patient ID: [name], female   DOB: 20-Jun-1922, 80 y.o.   MRN: [id number]         This is a routine visit.   LOC-skilled.   Facility [facility]     Chief complaint-medical management of dementia renal insufficiency dysphagia hyperlipidemia depression- acute visit secondary to allergy symptoms   History of present illness.   Patient is a pleasant elderly resident with the above diagnoses.   She's been relatively stable     She also apparently has some increased edema  at times as whichy responds to an  Short  duration increased dose of Lasix. --Weights have been relatively stable hovering around 200   .   Anxiety also appears to be relatively stable as well as depressive symptoms-she is on Celexa.--Also was on Depakote as a mood stabilizer which appears to help.   She continues to have variable blood pressure----she does get anxious at times and I think this may be contributing to the higher readings at times-  see listed ranging from systolics of 202R to 427C diastolics appears to be more 70s 60s  Her main concern today is she has a left great toenail as well as thumbnail on the left they both appear to have fungal changes and are loose-however they do not appear to be bleeding infected or tender .   Marland Kitchen Family medical social history has been reviewed per history and physical on 02/07/2011   .   Medications reviewed per MAR   .   Review of systems.   In general denies any fever or chills.   .   Head ears eyes nose mouth and throat Had some history of allergic rhinitis but this has been stable for some time as she does not complain of any nasal stuffiness visual changes or congestion    .   Respiratory. --Has occasional cough but this has not worsened-sometimes will complain of shortness of breath but says this has not really changed   Cardiac-has some chronic lower extremity edeme denies any chest pain or palpitations.   GI-no complaints of nausea vomiting diarrhea or  constipation. --   Musculoskeletal-does have a history of back pain and osteoarthritis but this is controlled apparently on Tylenol.   Neurologic-does not complaining of headache or dizziness.   Psych-does have a history of anxiety depression and mood disorder.   This has been stable recently.    Physical exam  Temperature 98.7 pulse 72 respirations 18 blood pressure taken manually was 162/90 .   In general this is a pleasant elderly female in no distress lying in bedr.   Her skin is warm and dry.   The wound that was on her left lower leg appears to be resolved I do note her toenail on the left great toe as well as fingernail on the left thumb appears to be somewhat loose although I do not see any sign of drainage bleeding edema or tenderness to palpation around the nailbed this does not look like an infection-she does have fungal changes which are not new Eyes-pupils appear equal round reactive light --.   Oropharynx is clear mucous membranes moist I do note some clear drainage   Chest --clear to auscultation-somewhat shallow air entry-- no labored breathing.   Heart is regular rate and rhythm she has her baseline chronic lower extremity edema I say 2+.   Abdomen obese soft nontender with positive bowel sounds==.   Muscle skeletal moves all extremities x4 at baseline does ambulate in  a wheelchair I do not note any deformities.   Neurologic-appears grossly intact I do not see a focal weaknesses or lateralizing findings  Psych she is oriented to self is conversant and cognizant although does have some confusion  .   Labs 06/10/2013.  Sodium 142 potassium 4.2 BUN 40 creatinine 1.2 and.  05/23/2013.  Sodium 141 potassium 4.2 BUN 36 creatinine 1.33.  Marland Kitchen   03/27/2013.   Cholesterol 254 triglycerides 94-HDL 120-LDL 1:15 3   WBC 4.5 hemoglobin 11.9 platelets 175.   Sodium 140 potassium 4.2 BUN 42 creatinine 1.30.   Liver function tests within normal limits except alkaline  phosphatase 156 and albumin of 3.4.   Depakote level was 44.2.      01/09/2013.   WBC 5.4 hemoglobin 11.4 platelets 194.   Sodium 140 potassium 4 BUN 31 creatinine 1.42.   Bilirubin 0.2 alkaline phosphatase 129 albumin 3.2 otherwise liver function tests within normal limits.   Marland Kitchen   11/19/2012   Sodium 139 potassium 4.1 BUN 32 creatinine 1.35.   Liver function tests within normal limits except alkaline phosphatase of 129 an albumin of 3 point.   11/02/2012.   BnP-254.4.   09/10/2012.   W BC 5.0 hemoglobin 11.7 platelets 151.   Valproic acid level-54.4.   07/09/2012.   Cholesterol 226 triglycerides 72 HDL 118 LDL 94    Assessment and plan.   Number 1--HTN--- systolics do appear to be somewhat elevated although there is variability- Will increase Lotensin to 40 mg day.checkBP Qshift...with a log for review next week....  - t x    #2-dementia-this appears to be relatively mild she continues on Aricept with Depakote as a mood stabilizer.--Will update Depakote level-   #3 CHF--edema--- this appears to be fairly stable she is on low-dose Lasix and low-dose potassium will update a metabolic panel    #7-WGNF this appears to be stable proptonix twice a day.   #5 renal insufficiency-this appears to be relatively stable we'll update BMP.   #6 hyperlipidemia-this appears stable  On   Mevacor--will update a lipid panel-as well as liver function tests.       #7-depression this appears stable on Celexa.   #8-anemia- suspect there is an element of chronic disease here --we'll need to update CBC.  #9-history of allergic rhinitis this has been asymptomatic for a while.  #  10-nail issues-will keep an eye on this this but I do not see any sign of infection currently we have discussed treatments for fungus in the past but i would be hesitant to try anything too aggressive secondary to medication side effects and patient has expressed understanding in the past--I did discuss this with her today  -- and she expressed understanding again of the challenges here      .   CPT-99310--of note greater than 35 minutes spent assessing patient-discussing her concerns at bedside as well as her status with nursing staff-and coordinating and formulating a plan of care for numerous diagnoses-of note greater than 50% of time spent coordinating plan of care                                                                              t

## 2013-09-18 DIAGNOSIS — F0390 Unspecified dementia without behavioral disturbance: Secondary | ICD-10-CM | POA: Diagnosis not present

## 2013-09-18 DIAGNOSIS — F39 Unspecified mood [affective] disorder: Secondary | ICD-10-CM | POA: Diagnosis not present

## 2013-09-18 DIAGNOSIS — I10 Essential (primary) hypertension: Secondary | ICD-10-CM | POA: Diagnosis not present

## 2013-09-18 DIAGNOSIS — D649 Anemia, unspecified: Secondary | ICD-10-CM | POA: Diagnosis not present

## 2013-09-18 DIAGNOSIS — F3289 Other specified depressive episodes: Secondary | ICD-10-CM | POA: Diagnosis not present

## 2013-09-18 DIAGNOSIS — Z79899 Other long term (current) drug therapy: Secondary | ICD-10-CM | POA: Diagnosis not present

## 2013-09-18 DIAGNOSIS — F329 Major depressive disorder, single episode, unspecified: Secondary | ICD-10-CM | POA: Diagnosis not present

## 2013-09-23 DIAGNOSIS — N189 Chronic kidney disease, unspecified: Secondary | ICD-10-CM | POA: Diagnosis not present

## 2013-11-11 ENCOUNTER — Encounter: Payer: Self-pay | Admitting: *Deleted

## 2013-11-15 DIAGNOSIS — F0390 Unspecified dementia without behavioral disturbance: Secondary | ICD-10-CM | POA: Diagnosis not present

## 2013-11-15 DIAGNOSIS — F39 Unspecified mood [affective] disorder: Secondary | ICD-10-CM | POA: Diagnosis not present

## 2013-11-15 DIAGNOSIS — F329 Major depressive disorder, single episode, unspecified: Secondary | ICD-10-CM | POA: Diagnosis not present

## 2013-11-15 DIAGNOSIS — F3289 Other specified depressive episodes: Secondary | ICD-10-CM | POA: Diagnosis not present

## 2013-11-26 ENCOUNTER — Emergency Department (HOSPITAL_COMMUNITY): Payer: Medicare Other

## 2013-11-26 ENCOUNTER — Inpatient Hospital Stay
Admission: RE | Admit: 2013-11-26 | Discharge: 2018-03-03 | Disposition: A | Discharge status: Nursing Home (Includes ICF) | Payer: Medicaid Other | Source: Ambulatory Visit | Attending: Internal Medicine | Admitting: Internal Medicine

## 2013-11-26 ENCOUNTER — Emergency Department (HOSPITAL_COMMUNITY)
Admission: EM | Admit: 2013-11-26 | Discharge: 2013-11-26 | Disposition: A | Discharge status: Home / Self Care | Payer: Medicare Other | Attending: Emergency Medicine | Admitting: Emergency Medicine

## 2013-11-26 ENCOUNTER — Encounter (HOSPITAL_COMMUNITY): Payer: Self-pay | Admitting: Emergency Medicine

## 2013-11-26 DIAGNOSIS — Z7902 Long term (current) use of antithrombotics/antiplatelets: Secondary | ICD-10-CM | POA: Diagnosis not present

## 2013-11-26 DIAGNOSIS — R05 Cough: Secondary | ICD-10-CM

## 2013-11-26 DIAGNOSIS — S0990XA Unspecified injury of head, initial encounter: Secondary | ICD-10-CM | POA: Diagnosis not present

## 2013-11-26 DIAGNOSIS — S0083XA Contusion of other part of head, initial encounter: Secondary | ICD-10-CM

## 2013-11-26 DIAGNOSIS — J690 Pneumonitis due to inhalation of food and vomit: Secondary | ICD-10-CM

## 2013-11-26 DIAGNOSIS — I252 Old myocardial infarction: Secondary | ICD-10-CM | POA: Insufficient documentation

## 2013-11-26 DIAGNOSIS — I509 Heart failure, unspecified: Secondary | ICD-10-CM | POA: Diagnosis not present

## 2013-11-26 DIAGNOSIS — Z79899 Other long term (current) drug therapy: Secondary | ICD-10-CM | POA: Insufficient documentation

## 2013-11-26 DIAGNOSIS — R062 Wheezing: Secondary | ICD-10-CM

## 2013-11-26 DIAGNOSIS — Z86718 Personal history of other venous thrombosis and embolism: Secondary | ICD-10-CM

## 2013-11-26 DIAGNOSIS — R609 Edema, unspecified: Principal | ICD-10-CM

## 2013-11-26 DIAGNOSIS — W1809XA Striking against other object with subsequent fall, initial encounter: Secondary | ICD-10-CM | POA: Insufficient documentation

## 2013-11-26 DIAGNOSIS — T148XXA Other injury of unspecified body region, initial encounter: Secondary | ICD-10-CM

## 2013-11-26 DIAGNOSIS — Z872 Personal history of diseases of the skin and subcutaneous tissue: Secondary | ICD-10-CM | POA: Diagnosis not present

## 2013-11-26 DIAGNOSIS — F411 Generalized anxiety disorder: Secondary | ICD-10-CM | POA: Diagnosis not present

## 2013-11-26 DIAGNOSIS — Z91040 Latex allergy status: Secondary | ICD-10-CM | POA: Insufficient documentation

## 2013-11-26 DIAGNOSIS — F0390 Unspecified dementia without behavioral disturbance: Secondary | ICD-10-CM | POA: Insufficient documentation

## 2013-11-26 DIAGNOSIS — R059 Cough, unspecified: Secondary | ICD-10-CM

## 2013-11-26 DIAGNOSIS — K219 Gastro-esophageal reflux disease without esophagitis: Secondary | ICD-10-CM | POA: Insufficient documentation

## 2013-11-26 DIAGNOSIS — S1093XA Contusion of unspecified part of neck, initial encounter: Principal | ICD-10-CM

## 2013-11-26 DIAGNOSIS — S0003XA Contusion of scalp, initial encounter: Secondary | ICD-10-CM | POA: Diagnosis not present

## 2013-11-26 DIAGNOSIS — R0989 Other specified symptoms and signs involving the circulatory and respiratory systems: Secondary | ICD-10-CM

## 2013-11-26 DIAGNOSIS — N189 Chronic kidney disease, unspecified: Secondary | ICD-10-CM | POA: Diagnosis not present

## 2013-11-26 DIAGNOSIS — S0993XA Unspecified injury of face, initial encounter: Secondary | ICD-10-CM | POA: Diagnosis not present

## 2013-11-26 DIAGNOSIS — M1990 Unspecified osteoarthritis, unspecified site: Secondary | ICD-10-CM | POA: Diagnosis not present

## 2013-11-26 DIAGNOSIS — W06XXXA Fall from bed, initial encounter: Secondary | ICD-10-CM | POA: Insufficient documentation

## 2013-11-26 DIAGNOSIS — R7989 Other specified abnormal findings of blood chemistry: Secondary | ICD-10-CM

## 2013-11-26 DIAGNOSIS — Y9302 Activity, running: Secondary | ICD-10-CM | POA: Insufficient documentation

## 2013-11-26 DIAGNOSIS — S139XXA Sprain of joints and ligaments of unspecified parts of neck, initial encounter: Secondary | ICD-10-CM | POA: Insufficient documentation

## 2013-11-26 DIAGNOSIS — I129 Hypertensive chronic kidney disease with stage 1 through stage 4 chronic kidney disease, or unspecified chronic kidney disease: Secondary | ICD-10-CM | POA: Insufficient documentation

## 2013-11-26 DIAGNOSIS — E785 Hyperlipidemia, unspecified: Secondary | ICD-10-CM | POA: Insufficient documentation

## 2013-11-26 DIAGNOSIS — IMO0002 Reserved for concepts with insufficient information to code with codable children: Secondary | ICD-10-CM | POA: Insufficient documentation

## 2013-11-26 DIAGNOSIS — Y9289 Other specified places as the place of occurrence of the external cause: Secondary | ICD-10-CM | POA: Insufficient documentation

## 2013-11-26 DIAGNOSIS — I10 Essential (primary) hypertension: Secondary | ICD-10-CM | POA: Diagnosis not present

## 2013-11-26 DIAGNOSIS — S161XXA Strain of muscle, fascia and tendon at neck level, initial encounter: Secondary | ICD-10-CM

## 2013-11-26 MED ORDER — ACETAMINOPHEN 325 MG PO TABS
650.0000 mg | ORAL_TABLET | Freq: Once | ORAL | Status: AC
  Administered 2013-11-26: 650 mg via ORAL
  Filled 2013-11-26: qty 2

## 2013-11-26 NOTE — Discharge Instructions (Signed)
Contusion A contusion is a deep bruise. Contusions are the result of an injury that caused bleeding under the skin. The contusion may turn blue, purple, or yellow. Minor injuries will give you a painless contusion, but more severe contusions may stay painful and swollen for a few weeks.  CAUSES  A contusion is usually caused by a blow, trauma, or direct force to an area of the body. SYMPTOMS   Swelling and redness of the injured area.  Bruising of the injured area.  Tenderness and soreness of the injured area.  Pain. DIAGNOSIS  The diagnosis can be made by taking a history and physical exam. An X-ray, CT scan, or MRI may be needed to determine if there were any associated injuries, such as fractures. TREATMENT  Specific treatment will depend on what area of the body was injured. In general, the best treatment for a contusion is resting, icing, elevating, and applying cold compresses to the injured area. Over-the-counter medicines may also be recommended for pain control. Ask your caregiver what the best treatment is for your contusion. HOME CARE INSTRUCTIONS   Put ice on the injured area.  Put ice in a plastic bag.  Place a towel between your skin and the bag.  Leave the ice on for 15-20 minutes, 03-04 times a day.  Only take over-the-counter or prescription medicines for pain, discomfort, or fever as directed by your caregiver. Your caregiver may recommend avoiding anti-inflammatory medicines (aspirin, ibuprofen, and naproxen) for 48 hours because these medicines may increase bruising.  Rest the injured area.  If possible, elevate the injured area to reduce swelling. SEEK IMMEDIATE MEDICAL CARE IF:   You have increased bruising or swelling.  You have pain that is getting worse.  Your swelling or pain is not relieved with medicines. MAKE SURE YOU:   Understand these instructions.  Will watch your condition.  Will get help right away if you are not doing well or get  worse. Document Released: 05/25/2005 Document Revised: 11/07/2011 Document Reviewed: 06/20/2011 [facility] Patient Information 2014 [place], [place].     Cervical Sprain A cervical sprain is an injury in the neck in which the strong, fibrous tissues (ligaments) that connect your neck bones stretch or tear. Cervical sprains can range from mild to severe. Severe cervical sprains can cause the neck vertebrae to be unstable. This can lead to damage of the spinal cord and can result in serious nervous system problems. The amount of time it takes for a cervical sprain to get better depends on the cause and extent of the injury. Most cervical sprains heal in 1 to 3 weeks. CAUSES  Severe cervical sprains may be caused by:   Contact sport injuries (such as from football, rugby, wrestling, hockey, auto racing, gymnastics, diving, martial arts, or boxing).   Motor vehicle collisions.   Whiplash injuries. This is an injury from a sudden forward-and backward whipping movement of the head and neck.  Falls.  Mild cervical sprains may be caused by:   Being in an awkward position, such as while cradling a telephone between your ear and shoulder.   Sitting in a chair that does not offer proper support.   Working at a poorly Landscape [employer] station.   Looking up or down for long periods of time.  SYMPTOMS   Pain, soreness, stiffness, or a burning sensation in the front, back, or sides of the neck. This discomfort may develop immediately after the injury or slowly, 24 hours or more after the injury.  Pain or tenderness directly in the middle of the back of the neck.   Shoulder or upper back pain.   Limited ability to move the neck.   Headache.   Dizziness.   Weakness, numbness, or tingling in the hands or arms.   Muscle spasms.   Difficulty swallowing or chewing.   Tenderness and swelling of the neck.  DIAGNOSIS  Most of the time your health care provider can diagnose  a cervical sprain by taking your history and doing a physical exam. Your health care provider will ask about previous neck injuries and any known neck problems, such as arthritis in the neck. X-rays may be taken to find out if there are any other problems, such as with the bones of the neck. Other tests, such as a CT scan or MRI, may also be needed.  TREATMENT  Treatment depends on the severity of the cervical sprain. Mild sprains can be treated with rest, keeping the neck in place (immobilization), and pain medicines. Severe cervical sprains are immediately immobilized. Further treatment is done to help with pain, muscle spasms, and other symptoms and may include:  Medicines, such as pain relievers, numbing medicines, or muscle relaxants.   Physical therapy. This may involve stretching exercises, strengthening exercises, and posture training. Exercises and improved posture can help stabilize the neck, strengthen muscles, and help stop symptoms from returning.  HOME CARE INSTRUCTIONS   Put ice on the injured area.   Put ice in a plastic bag.   Place a towel between your skin and the bag.   Leave the ice on for 15 20 minutes, 3 4 times a day.   If your injury was severe, you may have been given a cervical collar to wear. A cervical collar is a two-piece collar designed to keep your neck from moving while it heals.  Do not remove the collar unless instructed by your health care provider.  If you have long hair, keep it outside of the collar.  Ask your health care provider before making any adjustments to your collar. Minor adjustments may be required over time to improve comfort and reduce pressure on your chin or on the back of your head.  Ifyou are allowed to remove the collar for cleaning or bathing, follow your health care provider's instructions on how to do so safely.  Keep your collar clean by wiping it with mild soap and water and drying it completely. If the collar you have  been given includes removable pads, remove them every 1 2 days and hand wash them with soap and water. Allow them to air dry. They should be completely dry before you wear them in the collar.  If you are allowed to remove the collar for cleaning and bathing, wash and dry the skin of your neck. Check your skin for irritation or sores. If you see any, tell your health care provider.  Do not drive while wearing the collar.   Only take over-the-counter or prescription medicines for pain, discomfort, or fever as directed by your health care provider.   Keep all follow-up appointments as directed by your health care provider.   Keep all physical therapy appointments as directed by your health care provider.   Make any needed adjustments to your workstation to promote good posture.   Avoid positions and activities that make your symptoms worse.   Warm up and stretch before being active to help prevent problems.  SEEK MEDICAL CARE IF:   Your pain is not  controlled with medicine.   You are unable to decrease your pain medicine over time as planned.   Your activity level is not improving as expected.  SEEK IMMEDIATE MEDICAL CARE IF:   You develop any bleeding.  You develop stomach upset.  You have signs of an allergic reaction to your medicine.   Your symptoms get worse.   You develop new, unexplained symptoms.   You have numbness, tingling, weakness, or paralysis in any part of your body.  MAKE SURE YOU:   Understand these instructions.  Will watch your condition.  Will get help right away if you are not doing well or get worse. Document Released: 06/12/2007 Document Revised: 06/05/2013 Document Reviewed: 02/20/2013 [facility] Patient Information 2014 [place].

## 2013-11-26 NOTE — ED Notes (Signed)
C- collar applied at triage . 

## 2013-11-26 NOTE — ED Notes (Signed)
Patient from the [facility] and states "I was chasing that little boy and I fell on my face and hit the floor." Patient complaining of pain in left cheek area, nose, mouth, and neck.

## 2013-11-26 NOTE — ED Provider Notes (Addendum)
CSN: [id number]     Arrival date & time 11/26/13  0341 History   None    Chief Complaint  Patient presents with  . Fall  . Facial Injury     (Consider location/radiation/quality/duration/timing/severity/associated sxs/prior Treatment) HPI Comments: Pt apparently rolled out of bed and fell face first onto the floor from bed.  No LOC, is on plavix however.  She has some pain to her right upper lip and gum line.  She is edentulous.  She has some diffuse neck pain posteriorly.  No numbness or weakness or arms or legs.  No CP, SOB, no N/V.  She denies blurred vision or double vision.  She denies back pain, hip pain or pain to arms or legs otherwise.  Pt lives at [facility]  Pt is placed in ccollar by RN staff at arrival.    Patient is a 80 y.o. female presenting with fall and facial injury. The history is provided by the patient.  Fall This is a new problem. Associated symptoms include headaches. Pertinent negatives include no chest pain, no abdominal pain and no shortness of breath.  Facial Injury Associated symptoms: headaches and neck pain     Past Medical History  Diagnosis Date  . Cellulitis   . Dementia   . Anxiety   . Osteoarthritis   . Hyperlipidemia   . Hypertension   . Syncope   . Muscle weakness   . Gait abnormality   . GERD (gastroesophageal reflux disease)   . Gout   . Myocardial infarction 20 yrs ago    [place]  . CHF (congestive heart failure)   . Chronic kidney disease   . Glaucoma   . Glaucoma    Past Surgical History  Procedure Laterality Date  . Appendectomy    . Tonsillectomy    . Cataract extraction w/phaco  08/04/2011    Procedure: CATARACT EXTRACTION PHACO AND INTRAOCULAR LENS PLACEMENT (IOC);  Surgeon: Tonny Branch;  Location: [facility];  Service: Ophthalmology;  Laterality: Right;  CDE=18.53  . Cataract extraction w/phaco  08/25/2011    Procedure: CATARACT EXTRACTION PHACO AND INTRAOCULAR LENS PLACEMENT (IOC);  Surgeon: Tonny Branch;  Location: [facility];   Service: Ophthalmology;  Laterality: Left;  CDE:14.76   Family History  Problem Relation Age of Onset  . Diabetes Father   . Diabetes Sister   . Diabetes Brother   . Stroke Sister   . Stroke Sister   . Anesthesia problems Neg Hx   . Hypotension Neg Hx   . Malignant hyperthermia Neg Hx   . Pseudochol deficiency Neg Hx    History  Substance Use Topics  . Smoking status: Never Smoker   . Smokeless tobacco: Not on file  . Alcohol Use: No   OB History   Grav Para Term Preterm Abortions TAB SAB Ect Mult Living                 Review of Systems  Eyes: Negative for visual disturbance.  Respiratory: Negative for shortness of breath.   Cardiovascular: Negative for chest pain.  Gastrointestinal: Negative for abdominal pain.  Musculoskeletal: Positive for arthralgias and neck pain. Negative for back pain.  Skin: Negative for wound.  Neurological: Positive for headaches.      Allergies  Bee venom; Aspirin; Cabbage; and Latex  Home Medications   Current Outpatient Rx  Name  Route  Sig  Dispense  Refill  . brimonidine (ALPHAGAN) 0.2 % ophthalmic solution   Both Eyes   Place 1  drop into both eyes 2 (two) times daily.         Marland Kitchen acetaminophen (TYLENOL) 500 MG tablet   Oral   Take 1,000 mg by mouth 2 (two) times daily. For leg pain and arthritis         . benazepril (LOTENSIN) 10 MG tablet   Oral   Take 40 mg by mouth every morning.          . benzonatate (TESSALON) 100 MG capsule   Oral   Take 1 capsule (100 mg total) by mouth 2 (two) times daily as needed. for cough   30 capsule   1   . calcium carbonate (OS-CAL) 600 MG TABS   Oral   Take 500 mg by mouth 2 (two) times daily with a meal.           . calcium carbonate (TUMS - DOSED IN MG ELEMENTAL CALCIUM) 500 MG chewable tablet   Oral   Chew 1 tablet by mouth 3 (three) times daily.           . Calcium Carbonate-Vitamin D (OYSTER SHELL CALCIUM/D) 500-200 MG-UNIT TABS   Oral   Take by mouth. Take one  tablet by mouth three times a day          . CITALOPRAM HYDROBROMIDE PO   Oral   Take 10 mg by mouth daily.          . clopidogrel (PLAVIX) 75 MG tablet   Oral   Take 75 mg by mouth every morning. For thrombosis/prophylaxis         . cycloSPORINE (RESTASIS) 0.05 % ophthalmic emulsion   Both Eyes   Place 1 drop into both eyes 2 (two) times daily. For dry eyes         . divalproex (DEPAKOTE) 250 MG DR tablet   Oral   Take 250 mg by mouth every morning. Take 4 capsules(500mg ) in the evening for mood disorder.         . donepezil (ARICEPT) 10 MG tablet   Oral   Take 10 mg by mouth at bedtime. For dementia         . ENSURE (ENSURE)   Oral   Take 237 mLs by mouth 2 (two) times daily between meals.           . feeding supplement (PRO-STAT SUGAR FREE 64) LIQD   Oral   Take 30 mLs by mouth at bedtime. For wound care          . fluticasone (FLONASE) 50 MCG/ACT nasal spray   Nasal   1 spray by Nasal route daily.           Marland Kitchen EXPIRED: furosemide (LASIX) 20 MG tablet   Oral   Take 40 mg by mouth 2 (two) times daily.          Marland Kitchen ipratropium-albuterol (DUONEB) 0.5-2.5 (3) MG/3ML SOLN   Nebulization   Take 3 mLs by nebulization every 6 (six) hours as needed.         . lactulose (CHRONULAC) 10 GM/15ML solution      TAKE 15 MLs BY MOUTH     EVERY 4 DAYS AS NEEDEDFOR CONSTIPATION.   120 mL   0   . EXPIRED: lovastatin (MEVACOR) 40 MG tablet   Oral   Take 40 mg by mouth at bedtime. For hyperlipidemia         . nitroGLYCERIN (NITROSTAT) 0.4 MG SL tablet   Sublingual   Place 0.4 mg  under the tongue every 5 (five) minutes as needed for chest pain.          . pantoprazole (PROTONIX) 40 MG tablet   Oral   Take 40 mg by mouth 2 (two) times daily.           . potassium chloride (KLOR-CON) 10 MEQ CR tablet   Oral   Take 10 mEq by mouth every morning.           . promethazine (PHENERGAN) 12.5 MG tablet   Oral   Take 12.5 mg by mouth every 6 (six) hours  as needed for nausea or vomiting.         . Travoprost, BAK Free, (TRAVATAN) 0.004 % SOLN ophthalmic solution   Both Eyes   Place 1 drop into both eyes at bedtime. For glaucoma          BP 180/64  Pulse 81  Temp(Src) 97.9 F (36.6 C) (Oral)  Resp 18  SpO2 97% Physical Exam  Nursing note and vitals reviewed. Constitutional: She appears well-developed and well-nourished.  HENT:  Head: Normocephalic and atraumatic.    No bony crepitus of face, instability  Eyes: Conjunctivae and EOM are normal.  Neck: Muscular tenderness present. No spinous process tenderness present.  Cardiovascular: Normal rate.   Pulmonary/Chest: Effort normal.  Abdominal: Soft.  Musculoskeletal:       Right shoulder: She exhibits normal range of motion and no tenderness.       Left shoulder: She exhibits normal range of motion and no tenderness.       Right elbow: She exhibits normal range of motion. No tenderness found.       Left elbow: She exhibits normal range of motion. No tenderness found.       Right wrist: She exhibits normal range of motion and no tenderness.       Left wrist: She exhibits normal range of motion and no tenderness.       Right hip: She exhibits no tenderness.       Left hip: She exhibits no tenderness.       Right knee: No tenderness found.       Left knee: No tenderness found.       Cervical back: She exhibits tenderness and pain. She exhibits normal range of motion, no bony tenderness, no spasm and normal pulse.       Lumbar back: She exhibits no tenderness and no bony tenderness.    ED Course  Procedures (including critical care time) Labs Review Labs Reviewed - No data to display Imaging Review Ct Head Wo Contrast  11/26/2013   CLINICAL DATA:  Fall with facial injury.  EXAM: CT HEAD WITHOUT CONTRAST  CT MAXILLOFACIAL WITHOUT CONTRAST  CT CERVICAL SPINE WITHOUT CONTRAST  TECHNIQUE: Multidetector CT imaging of the head, cervical spine, and maxillofacial structures were  performed using the standard protocol without intravenous contrast. Multiplanar CT image reconstructions of the cervical spine and maxillofacial structures were also generated.  COMPARISON:  12/16/2012  FINDINGS: CT HEAD FINDINGS  Skull and Sinuses:No acute osseous abnormality. Hyperostosis interna.  Orbits: Bilateral cataract resection.  Brain: No evidence of acute abnormality, such as acute infarction, hemorrhage, hydrocephalus, or mass lesion/mass effect. Stable pattern of chronic small vessel disease with ischemic gliosis patchy through the bilateral deep cerebral white matter.  CT MAXILLOFACIAL FINDINGS  Mild subcutaneous contusion in the left submandibular region. Negative for facial fracture. Bilateral cataract resection and borderline proptosis. No evidence of orbital injury. There is  an incidental incisive foramen cyst.  CT CERVICAL SPINE FINDINGS  No evidence of acute fracture or traumatic subluxation. No gross cervical canal hematoma or prevertebral edema.  There is ankylosis of T1 through T5 at least, with the syndesmophytes along the vertebral bodies and bulky calcification of the ligamentum flavum and interspinous ligament. Dorsal ligamentous thickening narrows the posterior spinal canal. Diffuse degenerative disc disease and facet osteoarthritis in the cervical region. There is notable posterior ligamentous ossification at T1-2, narrowing the ventral canal.  IMPRESSION: No evidence of acute intracranial or cervical spine injury. Negative for facial fracture.   Electronically Signed   By: Jorje Guild M.D.   On: 11/26/2013 04:57   Ct Cervical Spine Wo Contrast  11/26/2013   CLINICAL DATA:  Fall with facial injury.  EXAM: CT HEAD WITHOUT CONTRAST  CT MAXILLOFACIAL WITHOUT CONTRAST  CT CERVICAL SPINE WITHOUT CONTRAST  TECHNIQUE: Multidetector CT imaging of the head, cervical spine, and maxillofacial structures were performed using the standard protocol without intravenous contrast. Multiplanar CT  image reconstructions of the cervical spine and maxillofacial structures were also generated.  COMPARISON:  12/16/2012  FINDINGS: CT HEAD FINDINGS  Skull and Sinuses:No acute osseous abnormality. Hyperostosis interna.  Orbits: Bilateral cataract resection.  Brain: No evidence of acute abnormality, such as acute infarction, hemorrhage, hydrocephalus, or mass lesion/mass effect. Stable pattern of chronic small vessel disease with ischemic gliosis patchy through the bilateral deep cerebral white matter.  CT MAXILLOFACIAL FINDINGS  Mild subcutaneous contusion in the left submandibular region. Negative for facial fracture. Bilateral cataract resection and borderline proptosis. No evidence of orbital injury. There is an incidental incisive foramen cyst.  CT CERVICAL SPINE FINDINGS  No evidence of acute fracture or traumatic subluxation. No gross cervical canal hematoma or prevertebral edema.  There is ankylosis of T1 through T5 at least, with the syndesmophytes along the vertebral bodies and bulky calcification of the ligamentum flavum and interspinous ligament. Dorsal ligamentous thickening narrows the posterior spinal canal. Diffuse degenerative disc disease and facet osteoarthritis in the cervical region. There is notable posterior ligamentous ossification at T1-2, narrowing the ventral canal.  IMPRESSION: No evidence of acute intracranial or cervical spine injury. Negative for facial fracture.   Electronically Signed   By: Jorje Guild M.D.   On: 11/26/2013 04:57   Ct Maxillofacial Wo Cm  11/26/2013   CLINICAL DATA:  Fall with facial injury.  EXAM: CT HEAD WITHOUT CONTRAST  CT MAXILLOFACIAL WITHOUT CONTRAST  CT CERVICAL SPINE WITHOUT CONTRAST  TECHNIQUE: Multidetector CT imaging of the head, cervical spine, and maxillofacial structures were performed using the standard protocol without intravenous contrast. Multiplanar CT image reconstructions of the cervical spine and maxillofacial structures were also  generated.  COMPARISON:  12/16/2012  FINDINGS: CT HEAD FINDINGS  Skull and Sinuses:No acute osseous abnormality. Hyperostosis interna.  Orbits: Bilateral cataract resection.  Brain: No evidence of acute abnormality, such as acute infarction, hemorrhage, hydrocephalus, or mass lesion/mass effect. Stable pattern of chronic small vessel disease with ischemic gliosis patchy through the bilateral deep cerebral white matter.  CT MAXILLOFACIAL FINDINGS  Mild subcutaneous contusion in the left submandibular region. Negative for facial fracture. Bilateral cataract resection and borderline proptosis. No evidence of orbital injury. There is an incidental incisive foramen cyst.  CT CERVICAL SPINE FINDINGS  No evidence of acute fracture or traumatic subluxation. No gross cervical canal hematoma or prevertebral edema.  There is ankylosis of T1 through T5 at least, with the syndesmophytes along the vertebral bodies and bulky calcification of the  ligamentum flavum and interspinous ligament. Dorsal ligamentous thickening narrows the posterior spinal canal. Diffuse degenerative disc disease and facet osteoarthritis in the cervical region. There is notable posterior ligamentous ossification at T1-2, narrowing the ventral canal.  IMPRESSION: No evidence of acute intracranial or cervical spine injury. Negative for facial fracture.   Electronically Signed   By: Jorje Guild M.D.   On: 11/26/2013 04:57     EKG Interpretation None     RA sat is 97% and I interpret to be adequate  5:03 AM CT's neg for sig acute injury.  Can remove c collar.  Will d/c home and pt can follow up with PMD and watch with head injury precuations at facility  MDM   Final diagnoses:  Facial contusion  Cervical strain    Pt with no sig injuries or abn's.  No focal neuro deficit.  Due to being on plavix and age, will get CT of head, face, c spine.  Low suspicion for fracture.      Saddie Benders. Dorna Mai, MD 11/26/13 [id number]  Saddie Benders. Dorna Mai,  MD 11/26/13 [phone]

## 2013-11-26 NOTE — ED Notes (Signed)
Removed c-collar after normal ct scans.

## 2013-12-05 DIAGNOSIS — F3289 Other specified depressive episodes: Secondary | ICD-10-CM | POA: Diagnosis not present

## 2013-12-05 DIAGNOSIS — F329 Major depressive disorder, single episode, unspecified: Secondary | ICD-10-CM | POA: Diagnosis not present

## 2013-12-05 DIAGNOSIS — F39 Unspecified mood [affective] disorder: Secondary | ICD-10-CM | POA: Diagnosis not present

## 2013-12-05 DIAGNOSIS — F0390 Unspecified dementia without behavioral disturbance: Secondary | ICD-10-CM | POA: Diagnosis not present

## 2013-12-17 ENCOUNTER — Non-Acute Institutional Stay (SKILLED_NURSING_FACILITY): Payer: Medicare Other | Admitting: Internal Medicine

## 2013-12-17 ENCOUNTER — Encounter: Payer: Self-pay | Admitting: Internal Medicine

## 2013-12-17 DIAGNOSIS — E785 Hyperlipidemia, unspecified: Secondary | ICD-10-CM

## 2013-12-17 DIAGNOSIS — F411 Generalized anxiety disorder: Secondary | ICD-10-CM

## 2013-12-17 DIAGNOSIS — F068 Other specified mental disorders due to known physiological condition: Secondary | ICD-10-CM | POA: Diagnosis not present

## 2013-12-17 DIAGNOSIS — I10 Essential (primary) hypertension: Secondary | ICD-10-CM | POA: Diagnosis not present

## 2013-12-17 DIAGNOSIS — R609 Edema, unspecified: Secondary | ICD-10-CM

## 2013-12-17 DIAGNOSIS — L0390 Cellulitis, unspecified: Secondary | ICD-10-CM

## 2013-12-17 DIAGNOSIS — F3289 Other specified depressive episodes: Secondary | ICD-10-CM

## 2013-12-17 DIAGNOSIS — K219 Gastro-esophageal reflux disease without esophagitis: Secondary | ICD-10-CM

## 2013-12-17 DIAGNOSIS — F329 Major depressive disorder, single episode, unspecified: Secondary | ICD-10-CM

## 2013-12-17 DIAGNOSIS — M1990 Unspecified osteoarthritis, unspecified site: Secondary | ICD-10-CM

## 2013-12-17 DIAGNOSIS — L0291 Cutaneous abscess, unspecified: Secondary | ICD-10-CM

## 2013-12-17 DIAGNOSIS — N289 Disorder of kidney and ureter, unspecified: Secondary | ICD-10-CM

## 2013-12-17 NOTE — Progress Notes (Signed)
Patient ID: [name], female   DOB: [birth year], 80 y.o.   MRN: [id number]   is a routine visit.  LOC-skilled.  Facility [facility]   Chief complaint-medical management of dementia renal insufficiency dysphagia hyperlipidemia depression- acute visit secondary to leg edema-erythema   History of present illness.  Patient is a pleasant elderly resident with the above diagnoses.  She's been relatively stable-- Several weeks ago she did fall and sustained a contusion to her face however this appears to have resolved unremarkably  She also apparently has some increased edema at times as whichy responds to an Short duration increased dose of Lasix. Appears to be the case again most recent weight is 204.6 which is up about 4 pounds since the beginning of the month.  She also appears to have some erythema of her shin areas bilaterally that is somewhat warm to touch- .  Anxiety also appears to be relatively stable as well as depressive symptoms-she is on Celexa.--Also was on Depakote as a mood stabilizer which appears to help.  She continues to have variable blood pressure----she does get anxious at times and I think this may be contributing to the higher readings at times- see listed ranging from systolics of 557D to 220U diastolics appears to be more 70s-manually this evening blood pressure was 140/60    She is not complaining of any shortness of breath or chest pain today however she is concerned about the edema and erythema of her legs  .  Marland Kitchen Family medical social history has been reviewed per history and physical on 02/07/2011  .  Medications reviewed per MAR  .  Review of systems.  In general denies any fever or chills.  .  Head ears eyes nose mouth and throat  Had some history of allergic rhinitis but this has been stable for some time as she does not complain of any nasal stuffiness visual changes or congestion  .  Respiratory. --Has occasional cough but this has not worsened-sometimes  will complain of shortness of breath but says this has not really changed --and does not complaining of shortness of breath this evening Cardiac-has some chronic lower extremity edema--this appears somewhat increased from baseline denies any chest pain or palpitations.  GI-no complaints of nausea vomiting diarrhea or constipation. --  Musculoskeletal-does have a history of back pain and osteoarthritis but this is controlled apparently on Tylenol.  Neurologic-does not complaining of headache or dizziness.  Psych-does have a history of anxiety depression and mood disorder.  This has been stable recently .  Physical exam  Temperature 97.1 pulse 88 respirations 19 blood pressure taken manually 140/60 weight is 204.6 this appears up about 4 pounds in the past 3 weeks .  In general this is a pleasant elderly female in no distress lying in bed.  Her skin is warm and dry.  The wound that was on her left lower leg appears to be resolved--however she does have some erythema of her shins bilaterally that is warm to touch  I   Eyes-pupils appear equal round reactive light --.  Oropharynx is clear mucous membranes moist  Chest --clear to auscultation-somewhat shallow air entry-- no labored breathing.  Heart is regular rate and rhythm she has somewhat increased edema of her legs bilaterally .  Abdomen obese soft nontender with positive bowel sounds==.  Muscle skeletal moves all extremities x4 at baseline does ambulate in a wheelchair I do not note any deformities.  Neurologic-appears grossly intact I do not see a focal  weaknesses or lateralizing findings  Psych she is oriented to self is conversant and cognizant although does have some confusion  .  Labs  09/18/2013.  Sodium 142 potassium 4.0 BUN 28 creatinine 1.25.  Liver function tests within normal limits except alkaline phosphatase of 146.  WBC 4.6 hemoglobin 13.6 platelets 173.  Depakote level 54.3.    06/10/2013.  Sodium 142 potassium  4.2 BUN 40 creatinine 1.2 .  05/23/2013.  Sodium 141 potassium 4.2 BUN 36 creatinine 1.33.  Marland Kitchen  03/27/2013.  Cholesterol 254 triglycerides 94-HDL 120-LDL 1:15 3  WBC 4.5 hemoglobin 11.9 platelets 175.  Sodium 140 potassium 4.2 BUN 42 creatinine 1.30.  Liver function tests within normal limits except alkaline phosphatase 156 and albumin of 3.4.  Depakote level was 44.2.   01/09/2013.  WBC 5.4 hemoglobin 11.4 platelets 194.  Sodium 140 potassium 4 BUN 31 creatinine 1.42.  Bilirubin 0.2 alkaline phosphatase 129 albumin 3.2 otherwise liver function tests within normal limits.  Marland Kitchen  11/19/2012  Sodium 139 potassium 4.1 BUN 32 creatinine 1.35.  Liver function tests within normal limits except alkaline phosphatase of 129 an albumin of 3 point.  11/02/2012.  BnP-254.4.  09/10/2012.  W BC 5.0 hemoglobin 11.7 platelets 151.  Valproic acid level-54.4.  07/09/2012.  Cholesterol 226 triglycerides 72 HDL 118 LDL 94    Assessment and plan.  Number 1--HTN--- continues to have some variability I do not see consistent elevations and manual tonight appeared to be fairly stable at systolic 701 Will continue to monitor again  with the patient's anxiety this fluctuates quite a bit--she continues on Lotensin which was increased back in January...  -  t  x  #2-dementia-this appears to be relatively mild she continues on Aricept with Depakote as a mood stabilizer.--Will update Depakote level-  #3 CHF--edema--- despite her edema her BNPs have been quite unremarkable in the past-we'll increase her Lasix to 40 mg a day as well as potassium to 20 mEq a day as well  Need to  keep a close eye on her renal function will decrease the Lasix after 3 days as well as potassium #4-GERD this appears to be stable proptonix twice a day.  #5 renal insufficiency-this appears to be relatively stable we'll update BMP. And also will check this next week secondary to the increase dose of Lasix  #6 hyperlipidemia-this appears  stable On Mevacor--will update a lipid panel-as well as liver function tests--of note she has consistently high HDLs.  #7-depression this appears stable on Celexa.  #8-anemia- suspect there is an element of chronic disease here --we'll need to update CBC.  #9-history of allergic rhinitis this has been asymptomatic for a while. #10-erythema of her shins that she does have some history of infection here Will treat with Keflex 500 mg twice a day for 7 days and monitor-certainly venous stasis may be contributing somewhat to this as well     .  CPT-99310--of note greater than 35 minutes spent assessing patient-discussing her concerns at bedside as well as her status with nursing staff-and coordinating and formulating a plan of care for numerous diagnoses-of note greater than 50% of time spent coordinating plan of care

## 2013-12-18 DIAGNOSIS — Z79899 Other long term (current) drug therapy: Secondary | ICD-10-CM | POA: Diagnosis not present

## 2013-12-18 DIAGNOSIS — E785 Hyperlipidemia, unspecified: Secondary | ICD-10-CM | POA: Diagnosis not present

## 2013-12-23 ENCOUNTER — Other Ambulatory Visit: Payer: Self-pay | Admitting: *Deleted

## 2013-12-23 DIAGNOSIS — Z79899 Other long term (current) drug therapy: Secondary | ICD-10-CM | POA: Diagnosis not present

## 2013-12-23 MED ORDER — BENZONATATE 100 MG PO CAPS: 100.0000 mg | ORAL_CAPSULE | Freq: Two times a day (BID) | ORAL | Status: DC | PRN

## 2013-12-23 NOTE — Telephone Encounter (Signed)
[facility] 

## 2014-01-04 ENCOUNTER — Non-Acute Institutional Stay (SKILLED_NURSING_FACILITY): Payer: Medicare Other | Admitting: Internal Medicine

## 2014-01-04 DIAGNOSIS — R609 Edema, unspecified: Secondary | ICD-10-CM

## 2014-01-04 DIAGNOSIS — I10 Essential (primary) hypertension: Secondary | ICD-10-CM

## 2014-01-04 DIAGNOSIS — N289 Disorder of kidney and ureter, unspecified: Secondary | ICD-10-CM | POA: Diagnosis not present

## 2014-01-04 NOTE — Progress Notes (Signed)
Patient ID: [name], female   DOB: 07-26-1922, 80 y.o.   MRN: [id number]   is a routine visit.  LOC-skilled.  Facility [facility]  Chief complaint-acute visit secondary to increased leg edema--followup hypertension  History of present illness.  Patient is a pleasant elderly resident has some increased edema at times as which responds to an Short duration increased dose of Lasix. Appears to be the case again-- most recent weight is 203 two days ago--of note we increased her Lasix about 3 weeks ago for a few days her weight  then was 204.   does not complaining of any increased shortness of breath however she is concerned about the edema--  Regards to her blood pressure issues-   She is on Lotensin 40 mg a day She continues to have variable blood pressure----she does get anxious at times and I think this may be contributing to the higher readings at times- see listed ranging from systolics of 720N to 470J diastolics appears to be more 60-70's-manually this evening blood pressure was 136/58 She is not complaining of any shortness of breath or chest pain today however she  againis concerned about the edema  .  Marland Kitchen Family medical social history has been reviewed per history and physical on 02/07/2011  .  Medications reviewed per [place]--  .  Review of systems.  In general denies any fever or chills.  .  Head ears eyes nose mouth and throat  Had some history of allergic rhinitis but this has been stable for some time as she does not complain of any nasal stuffiness visual changes or congestion  .  Respiratory. -  does not complaining of shortness of breath this evening  Cardiac-has some chronic lower extremity edema--this appears somewhat increased from baseline denies any chest pain or palpitations.  GI-no complaints of nausea vomiting diarrhea or constipation. --  Musculoskeletal-does have a history of back pain and osteoarthritis but this is controlled apparently on Tylenol.  is not  complaining of pain this evening-- .  Psych-does have a history of anxiety depression and mood disorder.  This has been stable recently  .  Physical exam   Temperature 97.4 pulse 73 respirations 20 blood pressure 136/58 weight is 203  .  In general this is a pleasant elderly female in no distress lying in bed.  Her skin is warm and dry.  T she does have some erythema of her shins bilaterally this appears to be somewhat chronic is not acutely warm to touch and actually  Quite a bit less tender than on previous exams  I     Chest --slight wheeze expiratory  On left side that cleared with cough--somewhat shallow air entry-- no labored breathing--fairly baseline exam.  Heart is regular rate and rhythm she has somewhat increased edema of her legs bilaterally-- I would say 2-3+  .  Abdomen obese soft nontender with positive bowel sounds==.     Psych she is oriented to self is conversant and cognizant although does have some confusion  .  Labs  12/23/2013.  Sodium 142 potassium 4.3 BUN 35 creatinine 1.16.  12/18/2013.  WBC 5.0 hemoglobin 11.3 platelets 151.  Sodium 144 potassium 3.8 BUN 29 creatinine 1.08.    09/18/2013.  Sodium 142 potassium 4.0 BUN 28 creatinine 1.25.  Liver function tests within normal limits except alkaline phosphatase of 146.  WBC 4.6 hemoglobin 13.6 platelets 173.  Depakote level 54.3.  06/10/2013.  Sodium 142 potassium 4.2 BUN 40 creatinine 1.2 .  05/23/2013.  Sodium 141 potassium 4.2 BUN 36 creatinine 1.33.  Marland Kitchen  03/27/2013.  Cholesterol 254 triglycerides 94-HDL 120-LDL 1:15 3  WBC 4.5 hemoglobin 11.9 platelets 175.  Sodium 140 potassium 4.2 BUN 42 creatinine 1.30.  Liver function tests within normal limits except alkaline phosphatase 156 and albumin of 3.4.  Depakote level was 44.2.  01/09/2013.  WBC 5.4 hemoglobin 11.4 platelets 194.  Sodium 140 potassium 4 BUN 31 creatinine 1.42.  Bilirubin 0.2 alkaline phosphatase 129 albumin 3.2 otherwise  liver function tests within normal limits.  Marland Kitchen  11/19/2012  Sodium 139 potassium 4.1 BUN 32 creatinine 1.35.  Liver function tests within normal limits except alkaline phosphatase of 129 an albumin of 3 point.  11/02/2012.  BnP-254.4.  09/10/2012.  W BC 5.0 hemoglobin 11.7 platelets 151.  Valproic acid level-54.4.  07/09/2012.  Cholesterol 226 triglycerides 72 HDL 118 LDL 94  Assessment and plan.   ..   #1 CHF--edema--- despite her edema her BNPs have been quite unremarkable in the past-we'll increase her Lasix to 40 mg a day as well as potassium to 20 mEq a day will do this for 4 days and then decrease back to previous doses she appears to respond fairly well to this-somewhat challenging with her history of renal insufficiency---will update a BMP on Wednesday of next week Monitor weights 2 times a week notify provider gain greater than 3 pounds   .  #2 renal insufficiency-actually this appears to be somewhat improved on recent labs-again will update this next week  #3-hypertension-I do not see consistent elevations continue to monitor this appears relatively stable    .  #4-erythema of her shins--at timestreated this with antibiotics but it still seems to persist to some extent--has not increased I suspect this is venous stasis related-we'll continue to monitor for now--actually this area appears quite a bit less tender than it has in previous exams--but will have to be watched   .  [id number]

## 2014-01-05 ENCOUNTER — Encounter: Payer: Self-pay | Admitting: Internal Medicine

## 2014-01-07 DIAGNOSIS — F39 Unspecified mood [affective] disorder: Secondary | ICD-10-CM | POA: Diagnosis not present

## 2014-01-07 DIAGNOSIS — F329 Major depressive disorder, single episode, unspecified: Secondary | ICD-10-CM | POA: Diagnosis not present

## 2014-01-07 DIAGNOSIS — F0390 Unspecified dementia without behavioral disturbance: Secondary | ICD-10-CM | POA: Diagnosis not present

## 2014-01-07 DIAGNOSIS — F3289 Other specified depressive episodes: Secondary | ICD-10-CM | POA: Diagnosis not present

## 2014-01-08 DIAGNOSIS — I10 Essential (primary) hypertension: Secondary | ICD-10-CM | POA: Diagnosis not present

## 2014-01-15 DIAGNOSIS — B351 Tinea unguium: Secondary | ICD-10-CM | POA: Diagnosis not present

## 2014-01-15 DIAGNOSIS — M79609 Pain in unspecified limb: Secondary | ICD-10-CM | POA: Diagnosis not present

## 2014-01-15 DIAGNOSIS — L738 Other specified follicular disorders: Secondary | ICD-10-CM | POA: Diagnosis not present

## 2014-01-15 DIAGNOSIS — R609 Edema, unspecified: Secondary | ICD-10-CM | POA: Diagnosis not present

## 2014-01-21 ENCOUNTER — Encounter: Payer: Self-pay | Admitting: Internal Medicine

## 2014-01-21 ENCOUNTER — Non-Acute Institutional Stay (SKILLED_NURSING_FACILITY): Payer: Medicare Other | Admitting: Internal Medicine

## 2014-01-21 DIAGNOSIS — F068 Other specified mental disorders due to known physiological condition: Secondary | ICD-10-CM | POA: Diagnosis not present

## 2014-01-21 DIAGNOSIS — K219 Gastro-esophageal reflux disease without esophagitis: Secondary | ICD-10-CM

## 2014-01-21 DIAGNOSIS — F3289 Other specified depressive episodes: Secondary | ICD-10-CM

## 2014-01-21 DIAGNOSIS — R609 Edema, unspecified: Secondary | ICD-10-CM

## 2014-01-21 DIAGNOSIS — M1990 Unspecified osteoarthritis, unspecified site: Secondary | ICD-10-CM

## 2014-01-21 DIAGNOSIS — F411 Generalized anxiety disorder: Secondary | ICD-10-CM

## 2014-01-21 DIAGNOSIS — E785 Hyperlipidemia, unspecified: Secondary | ICD-10-CM

## 2014-01-21 DIAGNOSIS — I10 Essential (primary) hypertension: Secondary | ICD-10-CM | POA: Diagnosis not present

## 2014-01-21 DIAGNOSIS — H6120 Impacted cerumen, unspecified ear: Secondary | ICD-10-CM

## 2014-01-21 DIAGNOSIS — F329 Major depressive disorder, single episode, unspecified: Secondary | ICD-10-CM

## 2014-01-21 NOTE — Progress Notes (Signed)
Patient ID: [name], female   DOB: 1921-11-29, 80 y.o.   MRN: [id number]   routine visit.  LOC-skilled.  Facility [facility]   Chief complaint-medical management of dementia renal insufficiency dysphagia hyperlipidemia depression- acute visit secondary to leg edema-erythema   History of present illness.  Patient is a pleasant elderly resident with the above diagnoses.  She's been relatively stable--  She also apparently has some increased edema at times as whichy responds to an Short duration increased dose of Lasix.--However this has been so persistent recently that we have increased her Lasix to 40 mg a day routinely --we will need to update her metabolic panel to assess her renal function  .  Anxiety also appears to be relatively stable as well as depressive symptoms-she is on Celexa.--Also was on Depakote as a mood stabilizer which appears to help.    She continues to have variable blood pressure----she does get anxious at times and I think this may be contributing to the higher readings at times-recent readings 116/92-134/62-158/72  She is not complaining of any shortness of breath or chest pain today and edema appears improved.  Her main complaint is she feels her ears are plugged up bilaterally she does have a history I bellieve wax buildup  .  Marland Kitchen Family medical social history has been reviewed per history and physical on 02/07/2011  .  Medications reviewed per MAR  .  Review of systems.  In general denies any fever or chills.  .  Head ears eyes nose mouth and throat  Had some history of allergic rhinitis but this has been stable for some time as she does not complain of any nasal stuffiness visual changes or congestion Does complain of her ears being plugged up  .  Respiratory. --Has occasional cough but this has not worsened-sometimes will complain of shortness of breath but says this has not really changed --and does not complaining of shortness of breath this afternoon    Cardiac-has some chronic lower extremity edema--this appears somewhat decreased from previous exam--- denies any chest pain or palpitations.  GI-no complaints of nausea vomiting diarrhea or constipation. --  Musculoskeletal-does have a history of back pain and osteoarthritis but this is controlled apparently on Tylenol.  Neurologic-does not complaining of headache or dizziness.  Psych-does have a history of anxiety depression and mood disorder.  This has been stable recently  .  Physical exam   Temperature 97.9 pulse 72 respirations 20 blood pressure 116/92-weight is 202 this is down a couple pounds since Lasix was increased .  In general this is a pleasant elderly female in no distress lying in bed.  Her skin is warm and dry.  The wound that was on her left lower leg appears to be resolved-- has chronic venous stasis changes lower legs bilaterally this has not really increased warm or tender from baseline  I  Eyes-pupils appear equal round reactive light -- Ears-minimal visualization of tympanic membranes bilaterally secondary to wax buildup I do not see any drainage bleeding erythema in either ear but there is significant wax obscuring the view.  Oropharynx is clear mucous membranes moist  Chest --clear to auscultation-somewhat shallow air entry-- no labored breathing.  Heart is regular rate and rhythm she has somewhat decreased edema of her legs bilaterally  .  Abdomen obese soft nontender with positive bowel sounds==.  Muscle skeletal moves all extremities x4 at baseline does ambulate in a wheelchair I do not note any deformities.  Neurologic-appears grossly intact I  do not see a focal weaknesses or lateralizing findings  Psych she is oriented to self is conversant and cognizant although does have some confusion  .  Labs 01/08/2014.  Sodium 145 potassium 4.3 BUN 39 creatinine 1.39.  12/23/2013.  Sodium 142 potassium 4.3 BUN 35 creatinine 1.16.  12/18/2013.  WBC 5.0  hemoglobin 11.3 platelets 151.  Sodium 144 potassium 3.8 BUN 29 creatinine 1.08.  Liver function tests within normal limits except alkaline phosphatase minimally elevated at 120 bilirubin 0.2 albumin 3.1.  Val acid-40.3.    09/18/2013.  Sodium 142 potassium 4.0 BUN 28 creatinine 1.25.  Liver function tests within normal limits except alkaline phosphatase of 146.  WBC 4.6 hemoglobin 13.6 platelets 173.  Depakote level 54.3.  06/10/2013.  Sodium 142 potassium 4.2 BUN 40 creatinine 1.2 .  05/23/2013.  Sodium 141 potassium 4.2 BUN 36 creatinine 1.33.  Marland Kitchen  03/27/2013.  Cholesterol 254 triglycerides 94-HDL 120-LDL 1:15 3  WBC 4.5 hemoglobin 11.9 platelets 175.  Sodium 140 potassium 4.2 BUN 42 creatinine 1.30.  Liver function tests within normal limits except alkaline phosphatase 156 and albumin of 3.4.  Depakote level was 44.2.  01/09/2013.  WBC 5.4 hemoglobin 11.4 platelets 194.  Sodium 140 potassium 4 BUN 31 creatinine 1.42.  Bilirubin 0.2 alkaline phosphatase 129 albumin 3.2 otherwise liver function tests within normal limits.  Marland Kitchen  11/19/2012  Sodium 139 potassium 4.1 BUN 32 creatinine 1.35.  Liver function tests within normal limits except alkaline phosphatase of 129 an albumin of 3 point.  11/02/2012.  BnP-254.4.  09/10/2012.  W BC 5.0 hemoglobin 11.7 platelets 151.  Valproic acid level-54.4.  07/09/2012.  Cholesterol 226 triglycerides 72 HDL 118 LDL 94    Assessment and plan .  Number 1--HTN--- continues to have some variability I do not see consistent elevations  -she continues on Lotensin which was increased back in January...  -  t  x  #2-dementia-this appears to be relatively mild she continues on Aricept with Depakote as a mood stabilizer.--  #3 CHF--edema--- despite her edema her BNPs have been quite unremarkable in the past-Lasix has been increased to 40 mg a day routinely she is on potassium 10 mEq a day Will order lab work tomorrow make sure this is all  stable  #4-GERD this appears to be stable proptonix twice a day.  #5 renal insufficiency-this appears to be relatively stable we'll update  Metabolic panel.   #6 hyperlipidemia-this appears stable On Mevacor--will update a lipid panel-as well as liver function tests--of note she has consistently high HDLs--and mildly elevated alkaline phosphatase.  #7-depression this appears stable on Celexa.  #8-anemia- suspect there is an element of chronic disease here --we'll need to update CBC.  #9-history of allergic rhinitis this has been asymptomatic for a while.  #10-erythema of her shins--this appears to be stable I do not see signs of infection increased warmth or tenderness #11-cerumen buildup-will treat with Debrox 10 drops twice a day x3 days flushed warm water on day 4-we'll do this for both ears  [phone]

## 2014-01-22 DIAGNOSIS — E785 Hyperlipidemia, unspecified: Secondary | ICD-10-CM | POA: Diagnosis not present

## 2014-01-22 DIAGNOSIS — I10 Essential (primary) hypertension: Secondary | ICD-10-CM | POA: Diagnosis not present

## 2014-01-22 DIAGNOSIS — D649 Anemia, unspecified: Secondary | ICD-10-CM | POA: Diagnosis not present

## 2014-01-24 DIAGNOSIS — F0390 Unspecified dementia without behavioral disturbance: Secondary | ICD-10-CM | POA: Diagnosis not present

## 2014-01-24 DIAGNOSIS — F39 Unspecified mood [affective] disorder: Secondary | ICD-10-CM | POA: Diagnosis not present

## 2014-01-24 DIAGNOSIS — F3289 Other specified depressive episodes: Secondary | ICD-10-CM | POA: Diagnosis not present

## 2014-01-24 DIAGNOSIS — F329 Major depressive disorder, single episode, unspecified: Secondary | ICD-10-CM | POA: Diagnosis not present

## 2014-02-06 DIAGNOSIS — N390 Urinary tract infection, site not specified: Secondary | ICD-10-CM | POA: Diagnosis not present

## 2014-02-11 ENCOUNTER — Encounter: Payer: Self-pay | Admitting: Internal Medicine

## 2014-02-11 ENCOUNTER — Non-Acute Institutional Stay (SKILLED_NURSING_FACILITY): Payer: Medicare Other | Admitting: Internal Medicine

## 2014-02-11 DIAGNOSIS — N390 Urinary tract infection, site not specified: Secondary | ICD-10-CM

## 2014-02-11 DIAGNOSIS — R609 Edema, unspecified: Secondary | ICD-10-CM

## 2014-02-11 NOTE — Progress Notes (Signed)
Patient ID: [name], female   DOB: 06-02-1922, 80 y.o.   MRN: [id number]   This is an acute visit.  Level of care skilled.  Facility [facility].  Chief complaint-acute visit followup urine culture-edema.  History of present illness.  Patient is a pleasant 80 year old female who apparently several days ago complaining of some burning with urination-urine culture was obtained which came back showing 60,000 colonies of Klebsiella pneumonia.  I did discuss patient's symptoms today and she is actually denying any dysuria or back pain or discomfort her vital signs have been stable she is afebrile appears certainly be at her baseline.  We also approximately a month ago increased her Lasix secondary to some persistent edema-this appears to be stable as well she's actually lost it appears about 3 pounds in the past 3 weeks I suspect this is fluid related.  She does not complaining of any shortness of breath or chest pain appears to be doing relatively well.  She does have a history of diastolic apparently CHF BNP's have been quite unremarkable in the past.  Family medical social history as been reviewed per history and physical on 02/07/2011.  Medications have been reviewed per MAR.  Review of systems.  General no complaints of fever chills.  Respiratory does not complaining of shortness of breath or cough.  Cardiac no chest pain has some baseline to slightly improve lower extremity edema.  GI does not complaining of abdominal discomfort nausea or vomiting.  Physical exam.  Temperature is 98.0 pulse 80 respirations 20 blood pressure 122/78 taken manually.  In general this is a pleasant elderly female in no distress sitting comfortably in her wheelchair.  Her skin is warm and dry.  Chest is clear to auscultation no labored breathing.  Heart is regular rate and rhythm without murmur gallop or rub she appears to have abaseline 1-2 plus lower extremity edema  slightly decreased  from previous exam.  Abdomen - obese soft nontender with positive bowel sounds   GU could not really appreciate significant suprapubic tenderness or any discharge or drainage  CV tenderness difficult to assess secondary to pt being quite reactive to any palpation to legs ..back.--but appeared to be negative and not changed from baseline   Labs.  01/22/2014.  WBC 4.5 hemoglobin 12.2 platelets 178.  Sodium 142 potassium 4.3 BUN 36 creatinine 1.18.  Assessment and plan.  Number -- question UTI-at this point patient appears essentially asymptomatic with no complaints of burning with urination no dysuria no discernible CV tenderness--no fever -- at this point continue to monitor  #2 lower extremity edema-this appears to be stable to slightly improved at her weight has been stable to slightly decreased I suspect this is fluid related-since Lasix was increased --will updatef a metabolic panel to make sure her renal insufficiency remains at baseline as well as electrolytes  [id number]

## 2014-02-12 DIAGNOSIS — I10 Essential (primary) hypertension: Secondary | ICD-10-CM | POA: Diagnosis not present

## 2014-02-25 DIAGNOSIS — N390 Urinary tract infection, site not specified: Secondary | ICD-10-CM | POA: Diagnosis not present

## 2014-03-07 ENCOUNTER — Non-Acute Institutional Stay (SKILLED_NURSING_FACILITY): Payer: Medicare Other | Admitting: Internal Medicine

## 2014-03-07 ENCOUNTER — Ambulatory Visit (HOSPITAL_COMMUNITY): Payer: Medicare Other | Attending: Internal Medicine

## 2014-03-07 DIAGNOSIS — K219 Gastro-esophageal reflux disease without esophagitis: Secondary | ICD-10-CM | POA: Diagnosis not present

## 2014-03-07 DIAGNOSIS — I10 Essential (primary) hypertension: Secondary | ICD-10-CM | POA: Diagnosis not present

## 2014-03-07 DIAGNOSIS — R5383 Other fatigue: Secondary | ICD-10-CM | POA: Diagnosis not present

## 2014-03-07 DIAGNOSIS — R0609 Other forms of dyspnea: Secondary | ICD-10-CM | POA: Diagnosis not present

## 2014-03-07 DIAGNOSIS — R109 Unspecified abdominal pain: Secondary | ICD-10-CM | POA: Insufficient documentation

## 2014-03-07 DIAGNOSIS — N289 Disorder of kidney and ureter, unspecified: Secondary | ICD-10-CM

## 2014-03-07 DIAGNOSIS — R635 Abnormal weight gain: Secondary | ICD-10-CM

## 2014-03-07 DIAGNOSIS — F068 Other specified mental disorders due to known physiological condition: Secondary | ICD-10-CM | POA: Diagnosis not present

## 2014-03-07 DIAGNOSIS — M1990 Unspecified osteoarthritis, unspecified site: Secondary | ICD-10-CM

## 2014-03-07 DIAGNOSIS — D649 Anemia, unspecified: Secondary | ICD-10-CM | POA: Diagnosis not present

## 2014-03-07 DIAGNOSIS — R609 Edema, unspecified: Secondary | ICD-10-CM

## 2014-03-07 DIAGNOSIS — R5381 Other malaise: Secondary | ICD-10-CM

## 2014-03-07 DIAGNOSIS — N10 Acute tubulo-interstitial nephritis: Secondary | ICD-10-CM

## 2014-03-07 DIAGNOSIS — E785 Hyperlipidemia, unspecified: Secondary | ICD-10-CM

## 2014-03-07 DIAGNOSIS — R0989 Other specified symptoms and signs involving the circulatory and respiratory systems: Secondary | ICD-10-CM | POA: Diagnosis not present

## 2014-03-07 DIAGNOSIS — R918 Other nonspecific abnormal finding of lung field: Secondary | ICD-10-CM | POA: Diagnosis not present

## 2014-03-07 NOTE — Progress Notes (Signed)
Patient ID: [name], female   DOB: April 30, 1922, 80 y.o.   MRN: [id number]   routine visit.  LOC-skilled.   Facility [facility]   Chief complaint-medical management of dementia renal insufficiency dysphagia hyperlipidemia depression- acute visit secondary to not feeling well   History of present illness.  Patient is a pleasant elderly resident with the above diagnoses.  She's been relatively stable--says she just does not feel well today with several complaints-rather nonspecific. She complains of somewhat overall malaise-saying she just does not feel well as this has gone on for several days-says occasionally she would have shortness of breath although this does not appear to be consistent-also at times some abdominal discomfort-eyes watering-has been no nausea and vomiting apparently she is still eating fairly well.  Her vital signs are stable O2 saturations in the 90s on room air  She recently did complain of some dysuria and a urine culture grew out Klebsiella pneumonia greater than 100,000 colonies she is completing a course of ciprofloxacin-however she says she still does not feel well  She has a history   of lower extremity edema we recently increased her Lasix to 40 mg a day secondary to somewhat persistent edema-her weights have been relatively stable around 200 pounds however past week it appears she's gained about 4 pounds.  --  .  Anxiety  appears to be relatively stable as well as depressive symptoms-she is on Celexa.--Also was on Depakote as a mood stabilizer which appears to help.   She also has a history of hypertension although this appears to have stabilized recently with blood pressures 121/63-135/83-108/54      .  Marland Kitchen Family medical social history has been reviewed per history and physical on 02/07/2011  .  Medications reviewed per MAR  .  Review of systems.  In general denies any fever or chills.  .  Head ears eyes nose mouth and throat  Had some history of  allergic rhinitis does complain of some eyes watering today    .  Respiratory. Does not really complain of increased cough from baseline has some intermittent shortness of breath this has not really  new  Cardiac-has some chronic lower extremity edema---- denies any chest pain or palpitations.  GI-no complaints of nausea vomiting diarrhea or constipation. -Sometimes does complain of some abdominal discomfort although this is rather nonspecific-  Musculoskeletal-does have a history of back pain and osteoarthritis but this is controlled apparently on Tylenol.  Neurologic-does not complaining of headache or dizziness.  Psych-does have a history of anxiety depression and mood disorder.   .  Physical exam   Temperature is 97.0 pulse 82 respirations 18 blood pressure 121/63 O2 saturation 94% on room air weight is 205.6 this is about a 4 pound weight gain over the past week  .  In general this is a pleasant elderly female in no distress sitting in her wheelchair  Her skin is warm and dry. --Has chronic stasis changes of her lower legs bilaterally   I  Eyes-pupils appear equal round reactive light --there is some clear discharge bilaterally   t O. pharynx is clear mucous membranes moist Chest --clear to auscultation-somewhat shallow air entry-- no labored breathing.  Heart is regular rate and rhythm she has I would say 2-3+ edema with venous stasis changes of her legs bilaterally this does not appear grossly changed from baseline  .  Abdomen obese soft nontender with positive bowel sounds==.  Muscle skeletal moves all extremities x4 at baseline does ambulate  in a wheelchair I do not note any deformities.  Neurologic-appears grossly intact I do not see a focal weaknesses or lateralizing findings  Psych she is oriented to self is conversant and cognizant although does have some confusion at times.    .  Labs 02/12/2014.  Sodium 145 potassium 4.4 BUN 45 creatinine  1.32  01/22/2014.  Cholesterol 240 triglycerides 100 HDL 112 LDL 108.  WBC 4.5 hemoglobin 12.2 platelets 178.  Sodium 142 potassium 4.3 BUN 36 creatinine 1.18.  Liver function tests within normal limits except alkaline phosphatase minimally elevated at 138.    01/08/2014.  Sodium 145 potassium 4.3 BUN 39 creatinine 1.39.  12/23/2013.  Sodium 142 potassium 4.3 BUN 35 creatinine 1.16.  12/18/2013.  WBC 5.0 hemoglobin 11.3 platelets 151.  Sodium 144 potassium 3.8 BUN 29 creatinine 1.08.  Liver function tests within normal limits except alkaline phosphatase minimally elevated at 120 bilirubin 0.2 albumin 3.1.  Val acid-40.3.  09/18/2013.  Sodium 142 potassium 4.0 BUN 28 creatinine 1.25.  Liver function tests within normal limits except alkaline phosphatase of 146.  WBC 4.6 hemoglobin 13.6 platelets 173.  Depakote level 54.3.  06/10/2013.  Sodium 142 potassium 4.2 BUN 40 creatinine 1.2 .  05/23/2013.  Sodium 141 potassium 4.2 BUN 36 creatinine 1.33.  Marland Kitchen  03/27/2013.  Cholesterol 254 triglycerides 94-HDL 120-LDL 1:15 3  WBC 4.5 hemoglobin 11.9 platelets 175.  Sodium 140 potassium 4.2 BUN 42 creatinine 1.30.  Liver function tests within normal limits except alkaline phosphatase 156 and albumin of 3.4.  Depakote level was 44.2.  01/09/2013.  WBC 5.4 hemoglobin 11.4 platelets 194.  Sodium 140 potassium 4 BUN 31 creatinine 1.42.  Bilirubin 0.2 alkaline phosphatase 129 albumin 3.2 otherwise liver function tests within normal limits.  Marland Kitchen  11/19/2012  Sodium 139 potassium 4.1 BUN 32 creatinine 1.35.  Liver function tests within normal limits except alkaline phosphatase of 129 an albumin of 3 point.  11/02/2012.  BnP-254.4.  09/10/2012.  W BC 5.0 hemoglobin 11.7 platelets 151.  Valproic acid level-54.4.  07/09/2012.  Cholesterol 226 triglycerides 72 HDL 118 LDL 94    Assessment and plan  .  Number 1--HTN--- this appears to be stable Lotensin was recently increased this  appears to have a beneficial effect ...  -   x  #2-dementia-this appears to be relatively mild she continues on Aricept with Depakote as a mood stabilizer.--  #3 CHF--edema--- Currently on Lasix 40 mg a day with 10 mEq of potassium supplementation --with recent weight gain would like to obtain an updated BNP as well as a chest x-ray also weight tomorrow notify provider if gain greater than 3 pounds  #4-GERD this appears to be stable proptonix twice a day.  #5 renal insufficiency-this appears to be relatively stable we'll update Metabolic panel.  #6 hyperlipidemia-this appears stable On Mevacor-- t--of note she has consistently high HDLs--and mildly elevated alkaline phosphatase.  #7-depression this appears stable on Celexa.  #8-anemia- suspect there is an element of chronic disease here --we'll need to update CBC.  #9-history of allergic rhinitis this has been asymptomatic for a while. 10- general malaise with somewhat nonspecific complaints of abdominal discomfort intermittent shortness of breath eyes watering-Will monitor vital signs and pulse ox every shift for 72 hours-also will obtain a chest and abdominal x-rays to rule out any acute etiology or pulmonary edema.  Also will order a CBC with differential CMP as well as TSH and a BNP  #11 UTI-she has finished a course of Cipro Will  obtain an updated urine culture.  #12-history allergic rhinitis-Will give a short course of Flonase one spray each nostril twice a day x5 days  [id number]-of note greater than 40 minutes spent assessing patient-reveiwing her medical records-discussing her status with nursing staff as well as with patient-and coordinating a plan of care for numerous diagnoses-of note greater than 50% of time spent coordinating plan of care

## 2014-03-09 ENCOUNTER — Encounter: Payer: Self-pay | Admitting: Internal Medicine

## 2014-03-10 DIAGNOSIS — I10 Essential (primary) hypertension: Secondary | ICD-10-CM | POA: Diagnosis not present

## 2014-03-11 DIAGNOSIS — N390 Urinary tract infection, site not specified: Secondary | ICD-10-CM | POA: Diagnosis not present

## 2014-03-12 DIAGNOSIS — I10 Essential (primary) hypertension: Secondary | ICD-10-CM | POA: Diagnosis not present

## 2014-03-13 DIAGNOSIS — F0390 Unspecified dementia without behavioral disturbance: Secondary | ICD-10-CM | POA: Diagnosis not present

## 2014-03-13 DIAGNOSIS — F39 Unspecified mood [affective] disorder: Secondary | ICD-10-CM | POA: Diagnosis not present

## 2014-03-13 DIAGNOSIS — F329 Major depressive disorder, single episode, unspecified: Secondary | ICD-10-CM | POA: Diagnosis not present

## 2014-03-13 DIAGNOSIS — F3289 Other specified depressive episodes: Secondary | ICD-10-CM | POA: Diagnosis not present

## 2014-03-17 DIAGNOSIS — Z961 Presence of intraocular lens: Secondary | ICD-10-CM | POA: Diagnosis not present

## 2014-03-17 DIAGNOSIS — H409 Unspecified glaucoma: Secondary | ICD-10-CM | POA: Diagnosis not present

## 2014-03-17 DIAGNOSIS — H04129 Dry eye syndrome of unspecified lacrimal gland: Secondary | ICD-10-CM | POA: Diagnosis not present

## 2014-03-17 DIAGNOSIS — IMO0002 Reserved for concepts with insufficient information to code with codable children: Secondary | ICD-10-CM | POA: Diagnosis not present

## 2014-03-21 ENCOUNTER — Non-Acute Institutional Stay (SKILLED_NURSING_FACILITY): Payer: Medicare Other | Admitting: Internal Medicine

## 2014-03-21 DIAGNOSIS — M109 Gout, unspecified: Secondary | ICD-10-CM | POA: Diagnosis not present

## 2014-03-21 DIAGNOSIS — R609 Edema, unspecified: Secondary | ICD-10-CM

## 2014-03-21 DIAGNOSIS — N289 Disorder of kidney and ureter, unspecified: Secondary | ICD-10-CM | POA: Diagnosis not present

## 2014-03-21 DIAGNOSIS — I10 Essential (primary) hypertension: Secondary | ICD-10-CM | POA: Diagnosis not present

## 2014-03-21 NOTE — Progress Notes (Signed)
Patient ID: KAHLIYAH DICK, female   DOB: [birth year], 80 y.o.   MRN: [id number]   This is an 17 visit.  Level of care skilled.  Facility [facility].  Chief complaint-acute visit secondary to patient concerns of lower extremity edema  History of present illness.  Patient is a pleasant 80 year old female who has a history of fairly persistent lower extremity edema with venous stasis.  She has had some mild weight gain in increased edema recently and her Lasix was increased to 40 mg a day routinely-we've been somewhat cautious with this especially in light of some renal insufficiency.  Her creatinine daily has run around 1.3 which is stable for her.  She has complained of nursing of some increased edema the last few days-she has been encouraged to elevate her legs there has been a recommendation for compression hose however apparently she is resistant to wearing this saying they are uncomfortable.  She does not complaining of any increased shortness of breath or chest pain her vital signs continued to be stable.  Prilosec her weights been stable here as well current weight 204 appears she fluctuates between 200-205 the past month.  Family medical history as been reviewed per history and physical on 02/07/2011-also previous progress note 03/07/2014.  Medications have been reviewed per MAR.  Review of systems.  Gen. no complaints of fever or chills.  Head ears eyes nose mouth and throat-does not complaining of any visual changes or sore throat.  Respiratory-does not complaining of increased cough has somewhat intermittent cough which is chronic gas is not complaining of increased shortness of breath. New  Cardiac no complaints of chest pain has chronic lower extremity edema venous stasis changes.  GI denies complaint of abdominal discomfort nausea or vomiting diarrhea constipation.  Muscle skeletal does have somewhat chronic tenderness to palpation of her legs this is not new otherwise  no complaints.  Neurologic does not complaining of dizziness or headache.  Psych does have some history of anxiety and depression but this appears to be stable currently.  Physical exam.  Temperature is 90.6 pulse 62 respirations 18 blood pressure taken manually 140/70.  Her weight is 204 this appears relatively stable recently.  In general this is a pleasant elderly female in no distress lying comfortably in bed.  Her skin is warm and dry she has venous stasis changes some chronic erythema of her lower legs bilaterally this is cool to touch and appears unchanged from baseline.  Eyes pupils appear reactive to light sclera and conjunctiva are clear visual acuity appears grossly intact.  Neck cannot really appreciate any JVD.  Chest is clear to auscultation no labored breathing.  Heart is regular rate and rhythm without murmur gallop or somewhat reduced pedal pulses I. suspect secondary to edema-she has chronic 2+ edema venous stasis changes of her legs bilaterally again these are not increasingly warm to touch there is some cool erythema as noted above.  Abdomen obese soft nontender with positive bowel sounds.  Muscle skeletal did not appreciate any joint inflammation-- patient does complain of gout-like symptoms at times however there does not appear to be an acute swelling or tenderness of her toes or joints of her feet bilaterally-  Neurologic appears grossly intact her speech is clear no lateralizing findings.  Labs  03/12/2014.  Sodium 143 potassium 4.2 BUN 36 creatinine 1.3.  03/08/2014.  WBC 5.2 -hemoglobin 11.6.  White count 2015.  Liver function tests within normal limits except alkaline phosphatase 123 this is somewhat chronic.  Assessment and plan.  #1 lower extremity edema-she is now on increased dose of Lasix at 40 mg a day her weight appears to be stable-at this point did encourage leg elevation-also will obtain a metabolic panel Next laboratory date to  assess her renal function-clinically she appears stable and appears since she's had her legs elevated somewhat this is more at baseline edema -also will update BNP-these have been fairly unremarkable in the past most recently in the 200s but will update-apparently there is some history of mild diastolic CHF.   #2 gout-at this point really do not see any overt evidence of this on exam-but will order a uric acid level.  #3-history of renal insufficiency again this remains at baseline-will update  #4 hypertension-I did review her blood pressures they're somewhat variable with systolics ranging from 846N-GE 160s-I did increase her Lotensin recently this appears to have helped some-at this point will monitor consider possibly adding hydralazine if systolics are consistently elevated  CPT-99309  1.  -  .

## 2014-03-22 ENCOUNTER — Encounter: Payer: Self-pay | Admitting: Internal Medicine

## 2014-03-24 DIAGNOSIS — R0609 Other forms of dyspnea: Secondary | ICD-10-CM | POA: Diagnosis not present

## 2014-03-24 DIAGNOSIS — I10 Essential (primary) hypertension: Secondary | ICD-10-CM | POA: Diagnosis not present

## 2014-03-24 DIAGNOSIS — R0989 Other specified symptoms and signs involving the circulatory and respiratory systems: Secondary | ICD-10-CM | POA: Diagnosis not present

## 2014-03-24 DIAGNOSIS — M109 Gout, unspecified: Secondary | ICD-10-CM | POA: Diagnosis not present

## 2014-03-25 ENCOUNTER — Non-Acute Institutional Stay (SKILLED_NURSING_FACILITY): Payer: Medicare Other | Admitting: Internal Medicine

## 2014-03-25 ENCOUNTER — Encounter: Payer: Self-pay | Admitting: Internal Medicine

## 2014-03-25 DIAGNOSIS — R609 Edema, unspecified: Secondary | ICD-10-CM

## 2014-03-25 DIAGNOSIS — N289 Disorder of kidney and ureter, unspecified: Secondary | ICD-10-CM

## 2014-03-25 DIAGNOSIS — E790 Hyperuricemia without signs of inflammatory arthritis and tophaceous disease: Secondary | ICD-10-CM

## 2014-03-25 DIAGNOSIS — I10 Essential (primary) hypertension: Secondary | ICD-10-CM

## 2014-03-25 DIAGNOSIS — R7989 Other specified abnormal findings of blood chemistry: Secondary | ICD-10-CM | POA: Diagnosis not present

## 2014-03-25 NOTE — Progress Notes (Signed)
Patient ID: [name], female   DOB: 14-Sep-1921, 80 y.o.   MRN: [id number]   This is an acute visit.  Level of care skilled.  Facility [facility].  Chief complaint-acute visit followup edema that renal insufficiency.-- question gout  History of present illness.  Patient is a pleasant 80 year old female who I saw last week for concerns about some increased lower extremity edema-this was chronic and her Lasix was recently increased to 40 mg a day.  She also complained of some foot pain think he had gout-we did do a uric acid which was mildly elevated 8.3-however she says the foot pain is better today.  The edema also appears to be somewhat improved which would lead to believe possibly the discomfort was caused by some mildly increased edema.  She does have a history of renal insufficiencyWith recent creatinines running in the 1.3 1.4 range-however creatinine are most recent lab is up to 1.69 with a BUN of 52.  Her vital signs appear to be stable she still had some elevated blood pressures at times I got 150/72 manual today-previously 149/57-159/78 this was done by machine-also see 184/91 although it usually does not run back high systolically.  She is on Lotensin this has been increased to 40 mg a day.  He did not complain of any headache shortness of breath or dizziness.  Of note we did obtain a BNP secondary to the edema this has chronically been unremarkable-and that is the case, lab done yesterday where it was only 134.  Family medical social history has been reviewed her history and physical on 02/07/2011 in previous progress note most recently 03/21/2014.  Medications have been reviewed per MAR.  Review of systems.  In general no complaints of fever chills appears she's lost about 4 pounds since last week.  Her skin i asked venous stasis changes lower extremities but does not complaining of any itching or rashes.  Respiratory does not complain of shortness of breath or  cough.  Cardiac does not complaining of chest pain thinks the edema has gotten somewhat better since last week.  GI does not complaining of any abdominal pain nausea or vomiting diarrhea or constipation.  Muscle skeletal has somewhat chronic leg pain but this appears to be better today according to patient.  Neurologic does not complaining of any dizziness headache or numbness.  Physical exam.  Temperature is 97.3 pulse 64 respirations 18 blood pressure 150/72-weight is 201 this is compared to 204 last week.  In general this is a pleasant elderly female in no distress she appears more comfortable and less anxious than last week.  Her skin is warm and dry with venous stasis changes lower extremities bilaterally baseline.  Chest is clear to auscultation no labored breathing.  Heart is regular rate and rhythm without murmur gallop or rub-she appears to have chronic edema slightly less than last week I would say with venous stasis changes at baseline.  Her abdomen is obese soft nontender with positive bowel sounds.  Muscle skeletal moves her extremities at baseline ambulates in a wheelchair palpation of her fingers and toes really did not elicit any sign of discomfort-I don't really see any signs of gout presently.  Neurologic is grossly intact her speech is clear no lateralizing findings.  Labs.  03/24/2014.  Sodium 143 potassium 4.5 BUN 52 creatinine 1.69.  Uric acid-8.3.  BNP-134  Assessment and plan.  #1-edema-this appears to be slightly improved-however there is concern with her creatinine trending up-Will decrease Lasix to 40  mg on Monday Wednesday and Friday and 20 mg all other days and recheck a metabolic panel first lab day next --her BNP continues to be unremarkable  #2-hypertension as she appears to have somewhat consistent elevated systolic readings-Will start low-dose Cardizem 120 mg a day--monitor blood pressure every shift with the log for review next week--this  was discussed with Dr. Dellia Nims via phone - hold for pulse less than 60.  #3-question gout-uric acid is mildly elevated however at this point appears asymptomatic certainly with her kidney function would be hesitant to add another medication unless she was overtly symptomatic at this point we'll continue to monitor  [phone]

## 2014-03-31 DIAGNOSIS — D649 Anemia, unspecified: Secondary | ICD-10-CM | POA: Diagnosis not present

## 2014-04-07 DIAGNOSIS — F329 Major depressive disorder, single episode, unspecified: Secondary | ICD-10-CM | POA: Diagnosis not present

## 2014-04-07 DIAGNOSIS — F0390 Unspecified dementia without behavioral disturbance: Secondary | ICD-10-CM | POA: Diagnosis not present

## 2014-04-07 DIAGNOSIS — F3289 Other specified depressive episodes: Secondary | ICD-10-CM | POA: Diagnosis not present

## 2014-04-09 ENCOUNTER — Non-Acute Institutional Stay (SKILLED_NURSING_FACILITY): Payer: Medicare Other | Admitting: Internal Medicine

## 2014-04-09 DIAGNOSIS — J309 Allergic rhinitis, unspecified: Secondary | ICD-10-CM | POA: Diagnosis not present

## 2014-04-09 DIAGNOSIS — K219 Gastro-esophageal reflux disease without esophagitis: Secondary | ICD-10-CM | POA: Diagnosis not present

## 2014-04-10 NOTE — Progress Notes (Signed)
Patient ID: [name], female   DOB: [birth year], 80 y.o.   MRN: [id number]           PROGRESS NOTE  DATE: 04/09/2014         FACILITY:  [facility]  LEVEL OF CARE: SNF (31)  Acute Visit  CHIEF COMPLAINT:  Manage allergic rhinitis and GERD.    HISTORY OF PRESENT ILLNESS: I was requested by the staff to assess the patient regarding above problem(s):  ALLERGIC RHINITIS:  I was requested by the ophthalmologist to assess the patient for possible dose reduction of Flonase due to increased risk of glaucoma.  Allergic rhinitis remains stable. Patient denies ongoing symptoms such as runny nose, sneezing or tearing. No complications reported from the current medication(s) being used.      GERD: pt's GERD is unstable.  Patient is having heartburn and indigestion.  Symptoms not controlled on Tums.  Denies abd. Pain, nausea or vomiting.  Currently on a PPI & tolerates it without any adverse reactions.     PAST MEDICAL HISTORY : Reviewed.  No changes/see problem list  CURRENT MEDICATIONS: Reviewed per MAR/see medication list  REVIEW OF SYSTEMS:  GENERAL: no change in appetite, no fatigue, no weight changes, no fever, chills or weakness RESPIRATORY: no cough, SOB, DOE,, wheezing, hemoptysis CARDIAC: no chest pain or palpitations; lower extremity swelling       GI: no abdominal pain, diarrhea, constipation, nausea or vomiting  PHYSICAL EXAMINATION  VS: see VS section  GENERAL: no acute distress, moderately obese body habitus NECK: supple, trachea midline, no neck masses, no thyroid tenderness, no thyromegaly RESPIRATORY: breathing is even & unlabored, BS CTAB CARDIAC: RRR, no murmur,no extra heart sounds, +3 bilateral lower extremity edema      GI: abdomen soft, normal BS, no masses, no tenderness, no hepatomegaly, no splenomegaly PSYCHIATRIC: the patient is alert & oriented to person, affect & behavior appropriate  ASSESSMENT/PLAN:  GERD.  Uncontrolled problem.  Start  Prilosec 20 mg q.d.      Allergic rhinitis.  Stable.  Decrease Flonase to q.o.d.      CPT CODE: 63893            Gayani Y Dasanayaka, [place] [phone]

## 2014-04-11 DIAGNOSIS — M109 Gout, unspecified: Secondary | ICD-10-CM | POA: Diagnosis not present

## 2014-04-11 DIAGNOSIS — M129 Arthropathy, unspecified: Secondary | ICD-10-CM | POA: Diagnosis not present

## 2014-04-11 DIAGNOSIS — M79609 Pain in unspecified limb: Secondary | ICD-10-CM | POA: Diagnosis not present

## 2014-04-11 DIAGNOSIS — B351 Tinea unguium: Secondary | ICD-10-CM | POA: Diagnosis not present

## 2014-04-17 ENCOUNTER — Non-Acute Institutional Stay (SKILLED_NURSING_FACILITY): Payer: Medicare Other | Admitting: Internal Medicine

## 2014-04-17 DIAGNOSIS — F068 Other specified mental disorders due to known physiological condition: Secondary | ICD-10-CM | POA: Diagnosis not present

## 2014-04-17 DIAGNOSIS — K219 Gastro-esophageal reflux disease without esophagitis: Secondary | ICD-10-CM | POA: Diagnosis not present

## 2014-04-17 DIAGNOSIS — E785 Hyperlipidemia, unspecified: Secondary | ICD-10-CM | POA: Diagnosis not present

## 2014-04-17 DIAGNOSIS — I10 Essential (primary) hypertension: Secondary | ICD-10-CM

## 2014-04-17 DIAGNOSIS — N289 Disorder of kidney and ureter, unspecified: Secondary | ICD-10-CM

## 2014-04-17 DIAGNOSIS — R609 Edema, unspecified: Secondary | ICD-10-CM

## 2014-04-20 ENCOUNTER — Encounter: Payer: Self-pay | Admitting: Internal Medicine

## 2014-04-20 NOTE — Progress Notes (Signed)
Patient ID: [name], female   DOB: 12-28-1921, 80 y.o.   MRN: [id number]   routine visit.  LOC-skilled.  Facility [facility]   Chief complaint-medical management of dementia renal insufficiency dysphagia hyperlipidemia depression- acute visit and are to persistent GERD  symptoms  History of present illness.  Patient is a pleasant elderly resident with the above diagnoses.  She's been relatively stable--her main complaint today is some consistent burning and dysphagia after eating-she is on Protonix twice a day-but says this is no longer helping much.  .    She has a history  of lower extremity edema we recently increased her Lasix to 40 mg a day secondary to somewhat persistent edema-however we cut this back down to alternating doses of 40 and 20 mg a day secondary to her creatinine rising-nonetheless her weight appears to have stabilized at her baseline at about 200-edema appears to be baseline this evening.  --  .  Anxiety appears to be relatively stable as well as depressive symptoms-she is on Celexa.--Also was on Depakote as a mood stabilizer which appears to help.  She also has a history of hypertension -she has had some variable systolics-she is on Lotensin we did add Cardizem recently  this appears to have helped most recent blood pressures are more consistent 135/60-134/54 .  Marland Kitchen Family medical social history has been reviewed per history and physical on 02/07/2011  .  Medications reviewed per MAR  .  Review of systems.  In general denies any fever or chills.  .  Head ears eyes nose mouth and throat  Had some history of allergic rhinitis does complain of some eyes watering today  .  Respiratory. Does not really complain of increased cough from baseline has some intermittent shortness of breath this has not really new  Cardiac-has some chronic lower extremity edema---- denies any chest pain or palpitations.  GI-is complaining of what appears to be GERD-like symptoms burning  and dysphagia after eating-does not really complain of abdominal pain nausea or vomiting diarrhea or constipation -  Musculoskeletal-does have a history of back pain and osteoarthritis but this is controlled apparently on Tylenol.  Neurologic-does not complaining of headache or dizziness.  Psych-does have a history of anxiety depression and mood disorder.  .  Physical exam   She is afebrile pulse 67 respirations 20 blood pressure 135/60 this appears to be baseline weight his baseline at 200.8 this is down from about 204 several weeks ago .  In general this is a pleasant elderly female in no distress sitting in her wheelchair  Her skin is warm and dry. --Has chronic stasis changes of her lower legs bilaterally  I  Eyes-pupils appear equal round reactive light --duples appear reactive to light  t O. pharynx is clear mucous membranes moist  Chest --clear to auscultation-somewhat shallow air entry-- no labored breathing.  Heart is regular rate and rhythm she has I would say 2-+ edema with venous stasis changes of her legs bilaterally this does not appear grossly changed from baseline  .  Abdomen obese soft nontender with positive bowel sounds==.  Muscle skeletal moves all extremities x4 at baseline does ambulate in a wheelchair I do not note any deformities.  Neurologic-appears grossly intact I do not see a focal weaknesses or lateralizing findings  Psych she is oriented to self is conversant and cognizant although does have some confusion at times.  .  Labs  03/31/2014.  Sodium 143 potassium 4.3 BUN 32 creatinine 1.27.  03/07/2014.  WBC 5.2 hemoglobin 11.6 platelets 193.    02/12/2014.  Sodium 145 potassium 4.4 BUN 45 creatinine 1.32  01/22/2014.  Cholesterol 240 triglycerides 100 HDL 112 LDL 108.  WBC 4.5 hemoglobin 12.2 platelets 178.  Sodium 142 potassium 4.3 BUN 36 creatinine 1.18.  Liver function tests within normal limits except alkaline phosphatase minimally elevated at  138.  01/08/2014.  Sodium 145 potassium 4.3 BUN 39 creatinine 1.39.  12/23/2013.  Sodium 142 potassium 4.3 BUN 35 creatinine 1.16.  12/18/2013.  WBC 5.0 hemoglobin 11.3 platelets 151.  Sodium 144 potassium 3.8 BUN 29 creatinine 1.08.  Liver function tests within normal limits except alkaline phosphatase minimally elevated at 120 bilirubin 0.2 albumin 3.1.  Val acid-40.3.  09/18/2013.  Sodium 142 potassium 4.0 BUN 28 creatinine 1.25.  Liver function tests within normal limits except alkaline phosphatase of 146.  WBC 4.6 hemoglobin 13.6 platelets 173.  Depakote level 54.3.  06/10/2013.  Sodium 142 potassium 4.2 BUN 40 creatinine 1.2 .  05/23/2013.  Sodium 141 potassium 4.2 BUN 36 creatinine 1.33.  Marland Kitchen  03/27/2013.  Cholesterol 254 triglycerides 94-HDL 120-LDL 1:15 3  WBC 4.5 hemoglobin 11.9 platelets 175.  Sodium 140 potassium 4.2 BUN 42 creatinine 1.30.  Liver function tests within normal limits except alkaline phosphatase 156 and albumin of 3.4.  Depakote level was 44.2.  01/09/2013.  WBC 5.4 hemoglobin 11.4 platelets 194.  Sodium 140 potassium 4 BUN 31 creatinine 1.42.  Bilirubin 0.2 alkaline phosphatase 129 albumin 3.2 otherwise liver function tests within normal limits.  Marland Kitchen  11/19/2012  Sodium 139 potassium 4.1 BUN 32 creatinine 1.35.  Liver function tests within normal limits except alkaline phosphatase of 129 an albumin of 3 point.  11/02/2012.  BnP-254.4.  09/10/2012.  W BC 5.0 hemoglobin 11.7 platelets 151.  Valproic acid level-54.4.  07/09/2012.  Cholesterol 226 triglycerides 72 HDL 118 LDL 94    Assessment and plan  .  Number 1--HTN--- this appears to be stable on Lotensin as well as Cardizem  ...  -  x  #2-dementia-this appears to be relatively mild she continues on Aricept with Depakote as a mood stabilizer.--  #3 CHF--edema---  Currently on Lasix 40-alternating with 20 mg a day with 10 mEq of potassium supplementation -this has been stable recently weights  have been stable as well as edema  #4-GERD--per patient she his having increased symptoms-she is already on Protonix twice a day-did discuss possibility of a GI consult if that is okay with her daughter-will write an order to that effect--also will check liver function tests and a metabolic panel .  #5 renal insufficiency-this appears to be relatively stable we'll update Metabolic panel.--Lasix was recently reduced secondary to creatinine rising   #6 hyperlipidemia-this appears stable according to recent labs On Mevacor-- t--of note she has consistently high HDLs--and mildly elevated alkaline phosphatase .  #7-depression this appears stable on Celexa .  #8-anemia- suspect there is an element of chronic disease here --we'll need to update CBC .  #9-history of allergic rhinitis this has been asymptomatic for a while.  [id number]

## 2014-04-21 DIAGNOSIS — D649 Anemia, unspecified: Secondary | ICD-10-CM | POA: Diagnosis not present

## 2014-05-01 ENCOUNTER — Encounter (INDEPENDENT_AMBULATORY_CARE_PROVIDER_SITE_OTHER): Payer: Self-pay | Admitting: Internal Medicine

## 2014-05-01 ENCOUNTER — Ambulatory Visit (INDEPENDENT_AMBULATORY_CARE_PROVIDER_SITE_OTHER): Payer: Medicare Other | Admitting: Internal Medicine

## 2014-05-01 VITALS — BP 112/62 | HR 76 | Temp 98.0°F | Ht 59.0 in | Wt 198.3 lb

## 2014-05-01 DIAGNOSIS — R109 Unspecified abdominal pain: Secondary | ICD-10-CM | POA: Insufficient documentation

## 2014-05-01 LAB — COMPREHENSIVE METABOLIC PANEL
ALK PHOS: 120 U/L — AB (ref 39–117)
ALT: 9 U/L (ref 0–35)
AST: 12 U/L (ref 0–37)
Albumin: 4 g/dL (ref 3.5–5.2)
BUN: 34 mg/dL — ABNORMAL HIGH (ref 6–23)
CALCIUM: 9.1 mg/dL (ref 8.4–10.5)
CHLORIDE: 106 meq/L (ref 96–112)
CO2: 29 mEq/L (ref 19–32)
Creat: 1.12 mg/dL — ABNORMAL HIGH (ref 0.50–1.10)
GLUCOSE: 81 mg/dL (ref 70–99)
POTASSIUM: 4.4 meq/L (ref 3.5–5.3)
SODIUM: 141 meq/L (ref 135–145)
TOTAL PROTEIN: 6.9 g/dL (ref 6.0–8.3)
Total Bilirubin: 0.3 mg/dL (ref 0.2–1.2)

## 2014-05-01 LAB — CBC WITH DIFFERENTIAL/PLATELET
BASOS PCT: 0 % (ref 0–1)
Basophils Absolute: 0 10*3/uL (ref 0.0–0.1)
Eosinophils Absolute: 0.1 10*3/uL (ref 0.0–0.7)
Eosinophils Relative: 1 % (ref 0–5)
HEMATOCRIT: 38.3 % (ref 36.0–46.0)
HEMOGLOBIN: 12.4 g/dL (ref 12.0–15.0)
LYMPHS ABS: 1.9 10*3/uL (ref 0.7–4.0)
Lymphocytes Relative: 33 % (ref 12–46)
MCH: 28.2 pg (ref 26.0–34.0)
MCHC: 32.4 g/dL (ref 30.0–36.0)
MCV: 87 fL (ref 78.0–100.0)
MONO ABS: 0.6 10*3/uL (ref 0.1–1.0)
Monocytes Relative: 10 % (ref 3–12)
NEUTROS ABS: 3.2 10*3/uL (ref 1.7–7.7)
Neutrophils Relative %: 56 % (ref 43–77)
Platelets: 209 10*3/uL (ref 150–400)
RBC: 4.4 MIL/uL (ref 3.87–5.11)
RDW: 15.2 % (ref 11.5–15.5)
WBC: 5.7 10*3/uL (ref 4.0–10.5)

## 2014-05-01 NOTE — Patient Instructions (Signed)
CBC, Cmet, CT abdomen/pelvis with CM, Further recommendations once CT back.

## 2014-05-01 NOTE — Progress Notes (Signed)
Subjective:    Patient ID: [name], female    DOB: 1921-09-20, 80 y.o.   MRN: [id number]  HPI Here today for f/u of her GERD. She was lat seen in 2012 for GERD and dysphagia. She presents with c/o lower abdominal pain. She has had the pain "for a long time". The pain occurs after she eats. She says she does have acid reflux.  She takes Protonix BID for acid reflux which she says helps some.  The pain occurs at random. She says the pain occurs a lot of times when she coughs and at night while lying down.  Appetite is good. No weight loss. She has gained 20 pounds since her last visit in 2012.  Patient is w/c bound.  She says she can eat about anything she wants. She does have trouble beef.  She says she cannot swallow any heavy meats. She says she is swollen in her lower abdomen. She usually has a BM x 3 a day. No melena or BRRB.  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 A 3 cmm sliding hiatal hernia. His recommendations: If she continues to have dysphagia, would consider barium pill esoghram.      06/06/2011 DG Esophagram: IMPRESSION:  Moderate diffuse age-related impairment of esophageal motility.  Tiny epiphrenic diverticulum.  No other esophageal abnormalities identified. Findings:  Normal esophageal distention.  No esophageal mass or stricture.  12.5 mm diameter barium tablet passes from oral cavity to stomach  without obstruction.     Review of Systems    Past Medical History  Diagnosis Date  . Cellulitis   . Dementia   . Anxiety   . Osteoarthritis   . Hyperlipidemia   . Hypertension   . Syncope   . Muscle weakness   . Gait abnormality   . GERD (gastroesophageal reflux disease)   . Gout   . Myocardial infarction 20 yrs ago    [facility]  . CHF (congestive heart failure)   . Chronic kidney disease   . Glaucoma   . Glaucoma   . Dysphagia     Past Surgical History  Procedure  Laterality Date  . Appendectomy    . Tonsillectomy    . Cataract extraction w/phaco  08/04/2011    Procedure: CATARACT EXTRACTION PHACO AND INTRAOCULAR LENS PLACEMENT (IOC);  Surgeon: Tonny Branch;  Location: [facility];  Service: Ophthalmology;  Laterality: Right;  CDE=18.53  . Cataract extraction w/phaco  08/25/2011    Procedure: CATARACT EXTRACTION PHACO AND INTRAOCULAR LENS PLACEMENT (IOC);  Surgeon: Tonny Branch;  Location: [facility];  Service: Ophthalmology;  Laterality: Left;  CDE:14.76    Allergies  Allergen Reactions  . Bee Venom Anaphylaxis  . Aspirin     Directed by physician  . Cabbage Other (See Comments)    Certain foods due to gout flares  . Latex Rash    Current Outpatient Prescriptions on File Prior to Visit  Medication Sig Dispense Refill  . acetaminophen (TYLENOL) 500 MG tablet Take 1,000 mg by mouth 2 (two) times daily. For leg pain and arthritis      . benazepril (LOTENSIN) 10 MG tablet Take 40 mg by mouth every morning.       . benzonatate (TESSALON) 100 MG capsule Take 1 capsule (100 mg total) by mouth 2 (two) times daily as needed. for cough  60 capsule  1  . brimonidine (ALPHAGAN) 0.2 % ophthalmic solution  Place 1 drop into both eyes 2 (two) times daily.      . calcium carbonate (OS-CAL) 600 MG TABS Take 500 mg by mouth 2 (two) times daily with a meal.        . calcium carbonate (TUMS - DOSED IN MG ELEMENTAL CALCIUM) 500 MG chewable tablet Chew 1 tablet by mouth 3 (three) times daily.        Marland Kitchen CITALOPRAM HYDROBROMIDE PO Take 10 mg by mouth daily.       . clopidogrel (PLAVIX) 75 MG tablet Take 75 mg by mouth every morning. For thrombosis/prophylaxis      . cycloSPORINE (RESTASIS) 0.05 % ophthalmic emulsion Place 1 drop into both eyes 2 (two) times daily. For dry eyes      . donepezil (ARICEPT) 10 MG tablet Take 10 mg by mouth at bedtime. For dementia      . feeding supplement (PRO-STAT SUGAR FREE 64) LIQD Take 30 mLs by mouth at bedtime. For wound care       . fluticasone  (FLONASE) 50 MCG/ACT nasal spray 1 spray by Nasal route daily.        Marland Kitchen ipratropium-albuterol (DUONEB) 0.5-2.5 (3) MG/3ML SOLN Take 3 mLs by nebulization every 6 (six) hours as needed.      . nitroGLYCERIN (NITROSTAT) 0.4 MG SL tablet Place 0.4 mg under the tongue every 5 (five) minutes as needed for chest pain.       . pantoprazole (PROTONIX) 40 MG tablet Take 40 mg by mouth 2 (two) times daily.        . potassium chloride (KLOR-CON) 10 MEQ CR tablet Take 10 mEq by mouth every morning.       . promethazine (PHENERGAN) 12.5 MG tablet Take 12.5 mg by mouth every 6 (six) hours as needed for nausea or vomiting.      . Travoprost, BAK Free, (TRAVATAN) 0.004 % SOLN ophthalmic solution Place 1 drop into both eyes at bedtime. For glaucoma      . divalproex (DEPAKOTE) 250 MG DR tablet Take 250 mg by mouth every morning. Take 4 capsules(500mg ) in the evening for mood disorder.      . furosemide (LASIX) 20 MG tablet Take 40 mg by mouth daily. 40mg  MWF 20mg  all other days      . lovastatin (MEVACOR) 40 MG tablet Take 40 mg by mouth at bedtime. For hyperlipidemia       No current facility-administered medications on file prior to visit.        Objective:   Physical Exam  Filed Vitals:   05/01/14 0940  BP: 112/62  Pulse: 76  Temp: 98 F (36.7 C)  Height: 4\' 11"  (1.499 m)  Weight: 198 lb 4.8 oz (89.948 kg)    Alert and oriented. Skin warm and dry. Oral mucosa is moist.   . Sclera anicteric, conjunctivae is pink. Thyroid not enlarged. No cervical lymphadenopathy. Bilateral expiratory wheezes. Heart regular rate and rhythm.  Abdomen is soft. Bowel sounds are positive. No hepatomegaly. No abdominal masses felt.Tenderness lower abdomen (which is superficial).  2-3 + edema to lower extremities. Hx of gout lower extremities.     Assessment & Plan:  Lower abdominal pain. None today during visit. Daughter in room. On exam tenderness lower abdominal (Sq fat). CT abdomen/pelvis with CM. CBC, CMET.

## 2014-05-02 DIAGNOSIS — D649 Anemia, unspecified: Secondary | ICD-10-CM | POA: Diagnosis not present

## 2014-05-03 ENCOUNTER — Ambulatory Visit (HOSPITAL_COMMUNITY): Payer: Medicare Other | Attending: Internal Medicine

## 2014-05-03 DIAGNOSIS — J4489 Other specified chronic obstructive pulmonary disease: Secondary | ICD-10-CM | POA: Diagnosis not present

## 2014-05-03 DIAGNOSIS — R05 Cough: Secondary | ICD-10-CM | POA: Diagnosis not present

## 2014-05-03 DIAGNOSIS — M109 Gout, unspecified: Secondary | ICD-10-CM | POA: Diagnosis not present

## 2014-05-03 DIAGNOSIS — R0602 Shortness of breath: Secondary | ICD-10-CM | POA: Insufficient documentation

## 2014-05-03 DIAGNOSIS — J449 Chronic obstructive pulmonary disease, unspecified: Secondary | ICD-10-CM | POA: Insufficient documentation

## 2014-05-03 DIAGNOSIS — R059 Cough, unspecified: Secondary | ICD-10-CM | POA: Diagnosis not present

## 2014-05-04 ENCOUNTER — Non-Acute Institutional Stay (SKILLED_NURSING_FACILITY): Payer: Medicare Other | Admitting: Internal Medicine

## 2014-05-04 ENCOUNTER — Encounter: Payer: Self-pay | Admitting: Internal Medicine

## 2014-05-04 DIAGNOSIS — R05 Cough: Secondary | ICD-10-CM

## 2014-05-04 DIAGNOSIS — R059 Cough, unspecified: Secondary | ICD-10-CM | POA: Insufficient documentation

## 2014-05-04 DIAGNOSIS — K219 Gastro-esophageal reflux disease without esophagitis: Secondary | ICD-10-CM | POA: Diagnosis not present

## 2014-05-04 DIAGNOSIS — R109 Unspecified abdominal pain: Secondary | ICD-10-CM | POA: Diagnosis not present

## 2014-05-04 DIAGNOSIS — M109 Gout, unspecified: Secondary | ICD-10-CM | POA: Diagnosis not present

## 2014-05-04 NOTE — Progress Notes (Signed)
Patient ID: [name], female   DOB: 1922-08-02, 80 y.o.   MRN: [id number]   This is an acute visit.  We will care skilled.  Facility [facility].  Chief complaint-acute visit followup abdominal discomfort-history of gout-history of cough.  History of present illness.  Patient is a pleasant 80 year old female with a fairly complex medical history.  She recently saw gastroenterology for complaints of somewhat chronic lower abdominal discomfort-she does have a CT of the abdomen pending per their recommendation they are following this.  She does have a history of hiatal hernia and significant GERD she is on Protonix twice a day this is helping.  She does complain of a chronic cough she is on nebulizers when necessary as well as Tessalon Perles when necessary-she remains concerned about a week did do a chest x-ray which did not show any acute process it was a limited exam because of patient's body habitus-it did show improvedaeration  of lungs with what appeared to be persistent likely atelectasis  I do note she is on Lotensin which can lead to a dry cough-although according nursing staff this is not really increased and has been somewhat persistent -- certainly history of GERD can contribute to this  As well--  She also has a history of gout had been on colchicine I think this was discontinued secondary to renal issues with a history of renal insufficiency-she at times will complain of leg pain although she is not complaining specifically of that today-- we did order a uric acid which was minimally elevated at 7.1   Family medical social history as been reviewed per history and physical on 02/07/2011.  Medications have been reviewed from ER.  Review this.  General no complaints of fever or chills.  Skin does not complaining of rashes or itching.  Respiratory is not complaining of increased shortness of breath-continues to complain of cough at times she describes this more as a throaty  cough with secretions-.  Cardiac is not complaining of chest pain has chronic venous stasis lower extremity edema which appears baseline.  GI does not complaining of abdominal pain today and has complained of this and has seen GI also with a history of GERD-does not complaining of nausea vomiting diarrhea or constipation.  Muscle skeletal does have somewhat chronic lower leg pain as noted above although she says it's a bit better today.  Neurologic does not complaining of dizziness or headache currently or syncopal type episodes.  Psych she does have a history of anxiety.  Physical exam.  She's afebrile pulse 76 respirations 20 blood pressure 140/63 O2 saturation 95% on room.  In general this is a pleasant elderly female in no distress sitting in her wheelchair.  Her skin is warm and dry she has chronic stasis changes lower legs bilaterally.  Oropharynx is clear mucous membranes appear moist.  Her chest is clear to auscultation with shallow air entry there is no labored breathing.  Heart is regular rate and rhythm with an occasional irregular beat continues I say with 2+ edema with venous stasis changes of her lower extremities-these are not really acutely Tender Touch today sometime she will be reactive with any palpation of her leg stance but not today-I do not note any active joints at this time  Muscle skeletal as noted above I do not really know if any acute tenderness palpation of her lower extremities or joints.  Neurologic grossly intact I don't see a focal weaknesses or lateralizing findings her speech is clear.  Psych she is oriented to self is pleasant and appropriate.  Labs.  05/03/2014.  Uric acid at 7.1.  August 24th 2015.  WBC 4.7-hemoglobin 12.2-platelets 182.  Sodium 143-potassium 4.4-BUN 30-creatinine 1.14.  Liver function tests within normal limits except alkaline phosphatase 137 which is baseline.  Assessment plan.  #1-history of GI discomfort-again  this is followed by GI they are going to do a CT scan-medically she appears stable today she continues on Protonix twice a day at this point will monitor.  Recent labs were fairly unremarkable alkaline phosphatase minimally elevated which has been her baseline.  #2-history of gout-uric acid level is minimally elevated-she is not overtly symptomatic today although we'll have to keep a eye on this-certainly with any evidence of flare consider restarting colchicine we have been somewhat conservative with this secondary to her renal function.  #3-history of cough-this appears chronic-she is on numerous agents including Tessalon when necessary-will try a short course of Mucinex 600 mg twice a day for 5 days to see if this will get some relief-I also note she is on an ACE inhibitor Will discuss with Dr. Dellia Nims possibly altering this iff cough persists  Note she is also on Lasix for significant edema at times this appears to be helping-again will have to monitor all this in regards to her gout symptoms    [id number]

## 2014-05-06 ENCOUNTER — Ambulatory Visit (HOSPITAL_COMMUNITY): Payer: Medicare Other

## 2014-05-08 ENCOUNTER — Ambulatory Visit (HOSPITAL_COMMUNITY)
Admit: 2014-05-08 | Discharge: 2014-05-08 | Disposition: A | Discharge status: Home / Self Care | Payer: Medicare Other | Source: Ambulatory Visit | Attending: Internal Medicine | Admitting: Internal Medicine

## 2014-05-08 DIAGNOSIS — R937 Abnormal findings on diagnostic imaging of other parts of musculoskeletal system: Secondary | ICD-10-CM | POA: Insufficient documentation

## 2014-05-08 DIAGNOSIS — R109 Unspecified abdominal pain: Secondary | ICD-10-CM

## 2014-05-08 DIAGNOSIS — K5730 Diverticulosis of large intestine without perforation or abscess without bleeding: Secondary | ICD-10-CM | POA: Diagnosis not present

## 2014-05-08 DIAGNOSIS — D259 Leiomyoma of uterus, unspecified: Secondary | ICD-10-CM | POA: Insufficient documentation

## 2014-05-08 MED ORDER — IOHEXOL 300 MG/ML  SOLN
100.0000 mL | Freq: Once | INTRAMUSCULAR | Status: AC | PRN
  Administered 2014-05-08: 100 mL via INTRAVENOUS

## 2014-05-28 DIAGNOSIS — D649 Anemia, unspecified: Secondary | ICD-10-CM | POA: Diagnosis not present

## 2014-06-04 DIAGNOSIS — M6281 Muscle weakness (generalized): Secondary | ICD-10-CM | POA: Diagnosis not present

## 2014-06-04 DIAGNOSIS — I509 Heart failure, unspecified: Secondary | ICD-10-CM | POA: Diagnosis not present

## 2014-06-04 DIAGNOSIS — M109 Gout, unspecified: Secondary | ICD-10-CM | POA: Diagnosis not present

## 2014-06-04 DIAGNOSIS — R279 Unspecified lack of coordination: Secondary | ICD-10-CM | POA: Diagnosis not present

## 2014-06-06 ENCOUNTER — Ambulatory Visit (HOSPITAL_COMMUNITY): Payer: Medicare Other | Attending: Internal Medicine

## 2014-06-06 DIAGNOSIS — R05 Cough: Secondary | ICD-10-CM | POA: Diagnosis not present

## 2014-06-06 DIAGNOSIS — R0989 Other specified symptoms and signs involving the circulatory and respiratory systems: Secondary | ICD-10-CM | POA: Insufficient documentation

## 2014-06-06 DIAGNOSIS — J302 Other seasonal allergic rhinitis: Secondary | ICD-10-CM | POA: Diagnosis not present

## 2014-06-09 DIAGNOSIS — F329 Major depressive disorder, single episode, unspecified: Secondary | ICD-10-CM | POA: Diagnosis not present

## 2014-06-09 DIAGNOSIS — F0390 Unspecified dementia without behavioral disturbance: Secondary | ICD-10-CM | POA: Diagnosis not present

## 2014-06-18 DIAGNOSIS — M79675 Pain in left toe(s): Secondary | ICD-10-CM | POA: Diagnosis not present

## 2014-06-18 DIAGNOSIS — M79674 Pain in right toe(s): Secondary | ICD-10-CM | POA: Diagnosis not present

## 2014-06-18 DIAGNOSIS — M1030 Gout due to renal impairment, unspecified site: Secondary | ICD-10-CM | POA: Diagnosis not present

## 2014-06-18 DIAGNOSIS — B351 Tinea unguium: Secondary | ICD-10-CM | POA: Diagnosis not present

## 2014-06-24 DIAGNOSIS — F329 Major depressive disorder, single episode, unspecified: Secondary | ICD-10-CM | POA: Diagnosis not present

## 2014-06-24 DIAGNOSIS — F0390 Unspecified dementia without behavioral disturbance: Secondary | ICD-10-CM | POA: Diagnosis not present

## 2014-06-30 DIAGNOSIS — M109 Gout, unspecified: Secondary | ICD-10-CM | POA: Diagnosis not present

## 2014-06-30 DIAGNOSIS — M6281 Muscle weakness (generalized): Secondary | ICD-10-CM | POA: Diagnosis not present

## 2014-06-30 DIAGNOSIS — R279 Unspecified lack of coordination: Secondary | ICD-10-CM | POA: Diagnosis not present

## 2014-06-30 DIAGNOSIS — I509 Heart failure, unspecified: Secondary | ICD-10-CM | POA: Diagnosis not present

## 2014-07-01 DIAGNOSIS — M109 Gout, unspecified: Secondary | ICD-10-CM | POA: Diagnosis not present

## 2014-07-01 DIAGNOSIS — M6281 Muscle weakness (generalized): Secondary | ICD-10-CM | POA: Diagnosis not present

## 2014-07-01 DIAGNOSIS — R279 Unspecified lack of coordination: Secondary | ICD-10-CM | POA: Diagnosis not present

## 2014-07-01 DIAGNOSIS — I509 Heart failure, unspecified: Secondary | ICD-10-CM | POA: Diagnosis not present

## 2014-07-02 DIAGNOSIS — I509 Heart failure, unspecified: Secondary | ICD-10-CM | POA: Diagnosis not present

## 2014-07-02 DIAGNOSIS — M6281 Muscle weakness (generalized): Secondary | ICD-10-CM | POA: Diagnosis not present

## 2014-07-02 DIAGNOSIS — M109 Gout, unspecified: Secondary | ICD-10-CM | POA: Diagnosis not present

## 2014-07-02 DIAGNOSIS — R279 Unspecified lack of coordination: Secondary | ICD-10-CM | POA: Diagnosis not present

## 2014-07-03 DIAGNOSIS — M6281 Muscle weakness (generalized): Secondary | ICD-10-CM | POA: Diagnosis not present

## 2014-07-03 DIAGNOSIS — M109 Gout, unspecified: Secondary | ICD-10-CM | POA: Diagnosis not present

## 2014-07-03 DIAGNOSIS — R279 Unspecified lack of coordination: Secondary | ICD-10-CM | POA: Diagnosis not present

## 2014-07-03 DIAGNOSIS — I509 Heart failure, unspecified: Secondary | ICD-10-CM | POA: Diagnosis not present

## 2014-07-04 DIAGNOSIS — M109 Gout, unspecified: Secondary | ICD-10-CM | POA: Diagnosis not present

## 2014-07-04 DIAGNOSIS — R279 Unspecified lack of coordination: Secondary | ICD-10-CM | POA: Diagnosis not present

## 2014-07-04 DIAGNOSIS — I509 Heart failure, unspecified: Secondary | ICD-10-CM | POA: Diagnosis not present

## 2014-07-04 DIAGNOSIS — M6281 Muscle weakness (generalized): Secondary | ICD-10-CM | POA: Diagnosis not present

## 2014-07-07 DIAGNOSIS — M109 Gout, unspecified: Secondary | ICD-10-CM | POA: Diagnosis not present

## 2014-07-07 DIAGNOSIS — M6281 Muscle weakness (generalized): Secondary | ICD-10-CM | POA: Diagnosis not present

## 2014-07-07 DIAGNOSIS — R279 Unspecified lack of coordination: Secondary | ICD-10-CM | POA: Diagnosis not present

## 2014-07-07 DIAGNOSIS — I509 Heart failure, unspecified: Secondary | ICD-10-CM | POA: Diagnosis not present

## 2014-07-08 DIAGNOSIS — M6281 Muscle weakness (generalized): Secondary | ICD-10-CM | POA: Diagnosis not present

## 2014-07-08 DIAGNOSIS — I509 Heart failure, unspecified: Secondary | ICD-10-CM | POA: Diagnosis not present

## 2014-07-08 DIAGNOSIS — M109 Gout, unspecified: Secondary | ICD-10-CM | POA: Diagnosis not present

## 2014-07-08 DIAGNOSIS — R279 Unspecified lack of coordination: Secondary | ICD-10-CM | POA: Diagnosis not present

## 2014-07-10 DIAGNOSIS — M6281 Muscle weakness (generalized): Secondary | ICD-10-CM | POA: Diagnosis not present

## 2014-07-10 DIAGNOSIS — I509 Heart failure, unspecified: Secondary | ICD-10-CM | POA: Diagnosis not present

## 2014-07-10 DIAGNOSIS — R279 Unspecified lack of coordination: Secondary | ICD-10-CM | POA: Diagnosis not present

## 2014-07-10 DIAGNOSIS — M109 Gout, unspecified: Secondary | ICD-10-CM | POA: Diagnosis not present

## 2014-07-14 DIAGNOSIS — I509 Heart failure, unspecified: Secondary | ICD-10-CM | POA: Diagnosis not present

## 2014-07-14 DIAGNOSIS — M6281 Muscle weakness (generalized): Secondary | ICD-10-CM | POA: Diagnosis not present

## 2014-07-14 DIAGNOSIS — R279 Unspecified lack of coordination: Secondary | ICD-10-CM | POA: Diagnosis not present

## 2014-07-14 DIAGNOSIS — M109 Gout, unspecified: Secondary | ICD-10-CM | POA: Diagnosis not present

## 2014-07-15 DIAGNOSIS — I509 Heart failure, unspecified: Secondary | ICD-10-CM | POA: Diagnosis not present

## 2014-07-15 DIAGNOSIS — R279 Unspecified lack of coordination: Secondary | ICD-10-CM | POA: Diagnosis not present

## 2014-07-15 DIAGNOSIS — M6281 Muscle weakness (generalized): Secondary | ICD-10-CM | POA: Diagnosis not present

## 2014-07-15 DIAGNOSIS — M109 Gout, unspecified: Secondary | ICD-10-CM | POA: Diagnosis not present

## 2014-07-16 DIAGNOSIS — M109 Gout, unspecified: Secondary | ICD-10-CM | POA: Diagnosis not present

## 2014-07-16 DIAGNOSIS — I509 Heart failure, unspecified: Secondary | ICD-10-CM | POA: Diagnosis not present

## 2014-07-16 DIAGNOSIS — M6281 Muscle weakness (generalized): Secondary | ICD-10-CM | POA: Diagnosis not present

## 2014-07-16 DIAGNOSIS — R279 Unspecified lack of coordination: Secondary | ICD-10-CM | POA: Diagnosis not present

## 2014-07-17 DIAGNOSIS — I509 Heart failure, unspecified: Secondary | ICD-10-CM | POA: Diagnosis not present

## 2014-07-17 DIAGNOSIS — R279 Unspecified lack of coordination: Secondary | ICD-10-CM | POA: Diagnosis not present

## 2014-07-17 DIAGNOSIS — M6281 Muscle weakness (generalized): Secondary | ICD-10-CM | POA: Diagnosis not present

## 2014-07-17 DIAGNOSIS — M109 Gout, unspecified: Secondary | ICD-10-CM | POA: Diagnosis not present

## 2014-07-18 DIAGNOSIS — M6281 Muscle weakness (generalized): Secondary | ICD-10-CM | POA: Diagnosis not present

## 2014-07-18 DIAGNOSIS — M109 Gout, unspecified: Secondary | ICD-10-CM | POA: Diagnosis not present

## 2014-07-18 DIAGNOSIS — I509 Heart failure, unspecified: Secondary | ICD-10-CM | POA: Diagnosis not present

## 2014-07-18 DIAGNOSIS — R279 Unspecified lack of coordination: Secondary | ICD-10-CM | POA: Diagnosis not present

## 2014-07-22 DIAGNOSIS — M109 Gout, unspecified: Secondary | ICD-10-CM | POA: Diagnosis not present

## 2014-07-22 DIAGNOSIS — R279 Unspecified lack of coordination: Secondary | ICD-10-CM | POA: Diagnosis not present

## 2014-07-22 DIAGNOSIS — M6281 Muscle weakness (generalized): Secondary | ICD-10-CM | POA: Diagnosis not present

## 2014-07-22 DIAGNOSIS — I509 Heart failure, unspecified: Secondary | ICD-10-CM | POA: Diagnosis not present

## 2014-07-23 DIAGNOSIS — R279 Unspecified lack of coordination: Secondary | ICD-10-CM | POA: Diagnosis not present

## 2014-07-23 DIAGNOSIS — M109 Gout, unspecified: Secondary | ICD-10-CM | POA: Diagnosis not present

## 2014-07-23 DIAGNOSIS — I509 Heart failure, unspecified: Secondary | ICD-10-CM | POA: Diagnosis not present

## 2014-07-23 DIAGNOSIS — M6281 Muscle weakness (generalized): Secondary | ICD-10-CM | POA: Diagnosis not present

## 2014-08-20 ENCOUNTER — Non-Acute Institutional Stay (SKILLED_NURSING_FACILITY): Payer: Medicare Other | Admitting: Internal Medicine

## 2014-08-20 DIAGNOSIS — J309 Allergic rhinitis, unspecified: Secondary | ICD-10-CM | POA: Diagnosis not present

## 2014-08-20 DIAGNOSIS — N289 Disorder of kidney and ureter, unspecified: Secondary | ICD-10-CM

## 2014-08-20 DIAGNOSIS — F03918 Unspecified dementia, unspecified severity, with other behavioral disturbance: Secondary | ICD-10-CM

## 2014-08-20 DIAGNOSIS — R609 Edema, unspecified: Secondary | ICD-10-CM | POA: Diagnosis not present

## 2014-08-20 DIAGNOSIS — K219 Gastro-esophageal reflux disease without esophagitis: Secondary | ICD-10-CM | POA: Diagnosis not present

## 2014-08-20 DIAGNOSIS — I10 Essential (primary) hypertension: Secondary | ICD-10-CM | POA: Diagnosis not present

## 2014-08-20 DIAGNOSIS — F0391 Unspecified dementia with behavioral disturbance: Secondary | ICD-10-CM | POA: Diagnosis not present

## 2014-08-20 NOTE — Progress Notes (Signed)
Patient ID: [name], female   DOB: 16-Mar-1922, 80 y.o.   MRN: [id number]   outine visit.  LOC-skilled.  Facility [facility]   Chief complaint-medical management of dementia renal insufficiency dysphagia hyperlipidemia depression- GERD Acute visit secondary to elevated blood pressure    History of present illness .  Patient is a pleasant elderly resident with the above diagnoses .  She's been relatively stable-now for for fairly extended period time-however nursing staff noted today she had an elevated blood pressure of 210/98--just was taken apparently just as a routine reading-she does have a history of variable blood pressures but did not the past appear to be consistently elevated she is on Lotensin 40 mg a day as well as Lasix 40 mg a day 3 times a week and 20 mg all other days-she is also on low dose Norvasc 2.5 mg a day.  Reviewing recent blood pressure log it does appear she has some elevated readings 158/85-I note he 181/72 earlier this week.  I also see 129/74 121/64  continues to be quite a bit of variability here.  It appears that she had been started on Cardizem at one point in the summer but this was DC'd I suspect secondary to some drug interaction-and as well she was started on low dose Norvasc I suspect we will go up on Norvasc  She was given clonidine 0.1 mg-her blood pressure did go down to 160/68 and about an hour-I did check her later and it was down to 144/84  Patient is not complaining of any headache dizziness syncopal-type feelings chest pain or increased shortness of breath from baseline  We did do an EKG which showed sinus bradycardia-I did compare this with a previous EKG which also showed sinus bradycardia--did not show any acute changes   .  She also has a history of chronic lower extremity edema again she is on the Lasix-weight appears to be stable at around 200 pounds.  She does have a history of GERD is now on protonic's twice a day-she has  complained of somewhat persistent abdominal discomfort-she has had a GI consult-they ordered a CT scan of the abdomen which did not show any acute process and should showed colonic diverticulosis without evidence of acute diverticulitis-also a fibroid uterus  --  .  Anxiety appears to be relatively stable as well as depressive symptoms-she is on Celexa.--Also was on Depakote as a mood stabilizer which appears to help.    .  . Family medical social history has been reviewed per history and physical on 02/07/2011  .  Medications reviewed per MAR  .  Review of systems.  In general denies any fever or chills.  .  Head ears eyes nose mouth and throat  Had some history of allergic rhinitis -does not complain of any visual changes  .  Respiratory. Does not really complain of increased cough from baseline has some intermittent shortness of breath this has not really new  Cardiac-has some chronic lower extremity edema---- denies any chest pain or palpitations.currently  GI-complains of occasional abdominal discomfort-this is chronic again and has been evaluated by GI does not complaining of acute abdominal discomfort  nausea or vomiting diarrhea or constipation -  Musculoskeletal-does have a history of back pain and osteoarthritis but this is controlled apparently on Tylenol.  Neurologic-does not complaining of headache or dizziness. Or syncopal-type feelings  Psych-does have a history of anxiety depression and mood disorder.  .  Physical exam  She is afebrile pulse of  58 respirations 18 blood pressure taken manually 144/84 weight is stable at 199.8   .  In general this is a pleasant elderly female in no distress sitting in her wheelchair  Her skin is warm and dry. --Has chronic stasis changes of her lower legs bilaterally  I  Eyes-pupils appear equal round reactive light --duples appear reactive to light  t O. pharynx is clear mucous membranes moist  Chest --clear to  auscultation-somewhat shallow air entry-- no labored breathing.  Heart is regular rate and rhythm she has I would say 2-+ edema with venous stasis changes of her legs bilaterally this does not appear grossly changed from baseline  .  Abdomen obese soft nontender with positive bowel sounds==.  Muscle skeletal moves all extremities x4 at baseline does ambulate in a wheelchair I do not note any deformities.  Neurologic-appears grossly intact I do not see a focal weaknesses or lateralizing findings strength appears to be intact on 4 extremities cranial nerves intact her speech is clear  Psych she is oriented to self is conversant and cognizant although does have some confusion at times.  .  Labs  05/28/2014.  WBC 4.6 hemoglobin 12.8 platelets 195.  Sodium 141 potassium 4.2 BUN 43 creatinine 1.23.  Liver function tests within normal limits except alkaline phosphatase 131    03/31/2014.  Sodium 143 potassium 4.3 BUN 32 creatinine 1.27.  03/07/2014.  WBC 5.2 hemoglobin 11.6 platelets 193.    02/12/2014.  Sodium 145 potassium 4.4 BUN 45 creatinine 1.32  01/22/2014.  Cholesterol 240 triglycerides 100 HDL 112 LDL 108.  WBC 4.5 hemoglobin 12.2 platelets 178.  Sodium 142 potassium 4.3 BUN 36 creatinine 1.18.  Liver function tests within normal limits except alkaline phosphatase minimally elevated at 138.  01/08/2014.  Sodium 145 potassium 4.3 BUN 39 creatinine 1.39.  12/23/2013.  Sodium 142 potassium 4.3 BUN 35 creatinine 1.16.  12/18/2013.  WBC 5.0 hemoglobin 11.3 platelets 151.  Sodium 144 potassium 3.8 BUN 29 creatinine 1.08.  Liver function tests within normal limits except alkaline phosphatase minimally elevated at 120 bilirubin 0.2 albumin 3.1.  Val acid-40.3.  09/18/2013.  Sodium 142 potassium 4.0 BUN 28 creatinine 1.25.  Liver function tests within normal limits except alkaline phosphatase of 146.  WBC 4.6 hemoglobin 13.6 platelets 173.  Depakote level 54.3.  06/10/2013.   Sodium 142 potassium 4.2 BUN 40 creatinine 1.2 .  05/23/2013.  Sodium 141 potassium 4.2 BUN 36 creatinine 1.33.  Marland Kitchen  03/27/2013.  Cholesterol 254 triglycerides 94-HDL 120-LDL 1:15 3  WBC 4.5 hemoglobin 11.9 platelets 175.  Sodium 140 potassium 4.2 BUN 42 creatinine 1.30.  Liver function tests within normal limits except alkaline phosphatase 156 and albumin of 3.4.  Depakote level was 44.2.  01/09/2013.  WBC 5.4 hemoglobin 11.4 platelets 194.  Sodium 140 potassium 4 BUN 31 creatinine 1.42.  Bilirubin 0.2 alkaline phosphatase 129 albumin 3.2 otherwise liver function tests within normal limits.  Marland Kitchen  11/19/2012  Sodium 139 potassium 4.1 BUN 32 creatinine 1.35.  Liver function tests within normal limits except alkaline phosphatase of 129 an albumin of 3 point.  11/02/2012.  BnP-254.4.  09/10/2012.  W BC 5.0 hemoglobin 11.7 platelets 151.  Valproic acid level-54.4.  07/09/2012.  Cholesterol 226 triglycerides 72 HDL 118 LDL 94    Assessment and plan  .  Number 1--HTN--- currently on Lotensin and Norvasc--I have reviewed recent blood pressures and there appears to be certainly times when the systolic is elevated-will increase the Norvasc to 5 mg and keep  a close eye on this with blood pressure checks every shift-clinically she appears stable--notify provider if systolic greater than 993 or diastolic greater than 95   EKG did not show any changes from her baseline--mild bradycardia ...  -  x  #2-dementia-this appears to be relatively mild she continues on Aricept with Depakote as a mood stabilizer.--Update Depakote level   #3 CHF--edema---  Currently on Lasix 40-alternating with 20 mg a day with 10 mEq of potassium supplementation -this has been stable recently weights have been stable as well as edema--update metabolic panel--BNP's have been unremarkable in the past  #4-GERD--continues on Protonix twice a day-this has been followed by GI-CT scan of the abdomen again appear to be  relatively benign-at this point monitor  .  #5 renal insufficiency-this appears to be relatively stable we'll update Metabolic panel.-Earlier this year--Lasix was  reduced secondary to creatinine rising   #6 hyperlipidemia-t On Mevacor-- t--of note she has consistently high HDLs--and mildly elevated alkaline phosphatase--update lipid panel and liver function tests .  #7-depression this appears stable on Celexa .  #8-anemia- suspect there is an element of chronic disease here --we'll need to update CBC .  #9-history of allergic rhinitis this has been asymptomatic for a while.   10-gout-this is been asymptomatic for a while continue to monitor  [id number]--note greater than 35 minutes spent assessing patient-discussing her status with nursing staff-reviewing her chart-and coordinating and formulating a plan of care for numerous diagnoses-of note greater than 50% of time spent coordinating plan of care

## 2014-08-29 DIAGNOSIS — L0390 Cellulitis, unspecified: Secondary | ICD-10-CM

## 2014-08-29 HISTORY — DX: Cellulitis, unspecified: L03.90

## 2014-09-26 ENCOUNTER — Non-Acute Institutional Stay (SKILLED_NURSING_FACILITY): Payer: Medicare Other | Admitting: Internal Medicine

## 2014-09-26 DIAGNOSIS — N289 Disorder of kidney and ureter, unspecified: Secondary | ICD-10-CM | POA: Diagnosis not present

## 2014-09-26 DIAGNOSIS — F0391 Unspecified dementia with behavioral disturbance: Secondary | ICD-10-CM

## 2014-09-26 DIAGNOSIS — F03918 Unspecified dementia, unspecified severity, with other behavioral disturbance: Secondary | ICD-10-CM

## 2014-09-26 DIAGNOSIS — R609 Edema, unspecified: Secondary | ICD-10-CM | POA: Diagnosis not present

## 2014-09-26 DIAGNOSIS — I10 Essential (primary) hypertension: Secondary | ICD-10-CM | POA: Diagnosis not present

## 2014-09-26 NOTE — Progress Notes (Signed)
Patient ID: [name], female   DOB: 08-03-1922, 81 y.o.   MRN: [id number]   Routine visit.  LOC-skilled.  Facility [facility]   Chief complaint-medical management of dementia renal insufficiency dysphagia hyperlipidemia depression- GERD Acute visit secondary to leg edema  History of present illness .  Patient is a pleasant elderly resident with the above diagnoses .  She has been quite stable-occasionally will develop some increased lower extremity edema on her Lasix will be increased for short period she is currently on Lasix 40 mg a day 3 days a week and 20 mg on other days this is complicated with some history of renal insufficiency although her most recent creatinine was 1.16 which is good for her.  Nursing staff does note some increased lower extremity edema today-I suspect we will increase her Lasix once again for. Up to 40 mg a day for several days and then bring it back to her previous level.  She does not complain of any increased shortness of breath-she does have a history of grade 1 diastolic dysfunction although her BNP  in the past has been quite unremarkable.  She does have a history of hypertension  she is on Lotensin 40 mg a day -she is also on low dose Norvasc 5 mg a day--which was increased about a month ago-her blood pressures appear to be relatively stable although variable ranging from 145/61-123/69 recently although her systolic elevations do not appear to be consistent .  It appears that she had been started on Cardizem at one point in the summer but this was DC'd I suspect secondary to some drug interaction-and as well she was started on low dose Norvasc I    Patient is not complaining of any headache dizziness syncopal-type feelings chest pain or increased shortness of breath from baseline   .  She does have a history of GERD is now on protonic's twice a day-she has complained of somewhat persistent abdominal discomfort-she has had a GI consult-they ordered a  CT scan of the abdomen which did not show any acute process and should showed colonic diverticulosis without evidence of acute diverticulitis-also a fibroid uterus  --  .  Anxiety appears to be relatively stable as well as depressive symptoms-she is on Celexa.--Also was on Depakote as a mood stabilizer which appears to help.    .  . Family medical social history has been reviewed per history and physical on 02/07/2011  .  Medications reviewed per MAR  .  Review of systems.  In general denies any fever or chills. --weight has been stable at 197.6 this is baseline was last month's value .  Head ears eyes nose mouth and throat  Had some history of allergic rhinitis -does not complain of any visual changes  .  Respiratory. Does not really complain of increased cough from baseline has some intermittent shortness of breath this has not really new  Cardiac-has some chronic lower extremity edema lightly increased from baseline today ---- denies any chest pain or palpitations.currently  GI-complains of occasional abdominal discomfort-this is chronic again and has been evaluated by GI does not complaining of acute abdominal discomfort  nausea or vomiting diarrhea or constipation -  Musculoskeletal-does have a history of back pain and osteoarthritis but this is controlled apparently on Tylenol.  Neurologic-does not complaining of headache or dizziness. Or syncopal-type feelings  Psych-does have a history of anxiety depression and mood disorder.  .  Physical exam   Dr. 97.1 pulse 62 respirations 20 recent  blood pressures as noted in history of present illness systolics range from [id number] is stable at 197.6   .  In general this is a pleasant elderly female in no distress si Her skin is warm and dry. --Has chronic stasis changes of her lower legs bilaterally  I  Eyes-pupils appear equal round reactive light --duples appear reactive to light  t O. pharynx is clear mucous membranes  moist  Chest --clear to auscultation-somewhat shallow air entry-- no labored breathing.  Heart is regular rate and rhythm she has I would say 2-3-+ edema with venous stasis changes of her legs bilaterally -this appears slightly increased from baseline .  Abdomen obese soft nontender with positive bowel sounds==.  Muscle skeletal moves all extremities x4 at baseline does ambulate in a wheelchair I do not note any deformities.  Neurologic-appears grossly intact I do not see a focal weaknesses or lateralizing findings strength appears to be intact on 4 extremities cranial nerves intact her speech is clear  Psych she is oriented to self is conversant and cognizant although does have some confusion at times.  .  Labs  08/21/2014.  WBC 5.0 hemoglobin 12.2 platelets 2:15.  Sodium 140 potassium 4.2 BUN 28 creatinine 1.16-liver function tests within normal limits.  Cholesterol 229-triglycerides 97-HDL 97-LDL 113  05/28/2014.  WBC 4.6 hemoglobin 12.8 platelets 195.  Sodium 141 potassium 4.2 BUN 43 creatinine 1.23.  Liver function tests within normal limits except alkaline phosphatase 131    03/31/2014.  Sodium 143 potassium 4.3 BUN 32 creatinine 1.27.  03/07/2014.  WBC 5.2 hemoglobin 11.6 platelets 193.    02/12/2014.  Sodium 145 potassium 4.4 BUN 45 creatinine 1.32  01/22/2014.  Cholesterol 240 triglycerides 100 HDL 112 LDL 108.  WBC 4.5 hemoglobin 12.2 platelets 178.  Sodium 142 potassium 4.3 BUN 36 creatinine 1.18.  Liver function tests within normal limits except alkaline phosphatase minimally elevated at 138.  01/08/2014.  Sodium 145 potassium 4.3 BUN 39 creatinine 1.39.  12/23/2013.  Sodium 142 potassium 4.3 BUN 35 creatinine 1.16.  12/18/2013.  WBC 5.0 hemoglobin 11.3 platelets 151.  Sodium 144 potassium 3.8 BUN 29 creatinine 1.08.  Liver function tests within normal limits except alkaline phosphatase minimally elevated at 120 bilirubin 0.2 albumin 3.1.  Val  acid-40.3.  09/18/2013.  Sodium 142 potassium 4.0 BUN 28 creatinine 1.25.  Liver function tests within normal limits except alkaline phosphatase of 146.  WBC 4.6 hemoglobin 13.6 platelets 173.  Depakote level 54.3.  06/10/2013.  Sodium 142 potassium 4.2 BUN 40 creatinine 1.2 .  05/23/2013.  Sodium 141 potassium 4.2 BUN 36 creatinine 1.33.  Marland Kitchen  03/27/2013.  Cholesterol 254 triglycerides 94-HDL 120-LDL 1:15 3  WBC 4.5 hemoglobin 11.9 platelets 175.  Sodium 140 potassium 4.2 BUN 42 creatinine 1.30.  Liver function tests within normal limits except alkaline phosphatase 156 and albumin of 3.4.  Depakote level was 44.2.  01/09/2013.  WBC 5.4 hemoglobin 11.4 platelets 194.  Sodium 140 potassium 4 BUN 31 creatinine 1.42.  Bilirubin 0.2 alkaline phosphatase 129 albumin 3.2 otherwise liver function tests within normal limits.  Marland Kitchen  11/19/2012  Sodium 139 potassium 4.1 BUN 32 creatinine 1.35.  Liver function tests within normal limits except alkaline phosphatase of 129 an albumin of 3 point.  11/02/2012.  BnP-254.4.  09/10/2012.  W BC 5.0 hemoglobin 11.7 platelets 151.  Valproic acid level-54.4.  07/09/2012.  Cholesterol 226 triglycerides 72 HDL 118 LDL 94    Assessment and plan  .  Number 1--HTN--- currently on Lotensin and  Norvasc-this appears improved somewhat although systolics are still variable at this point will continue to monitor   ...  -  x  #2-dementia-this appears to be relatively mild she continues on Aricept with Depakote as a mood stabilizer.-   #3 CHF--edema---  Currently on Lasix 40-alternating with 20 mg a day with 10 mEq of potassium supplementation -edema appears to be slightly increased today Will increase Lasix to 40 mg for Saturday and Sunday and then resume current dosing-also will supplement with additional potassium for 3 days-update a basic metabolic panel on Monday, February 1.  Also weigh patient will notify provider of gain greater than 3  pounds    #4-GERD--continues on Protonix twice a day-this has been followed by GI-CT scan of the abdomen again appear to be relatively benign-at this point monitor  .  #5 renal insufficiency-this appears to be relatively stable we'll update Metabolic panel.-Earlier this year--Lasix was  reduced secondary to creatinine rising   #6 hyperlipidemia-t On Mevacor-- t--of note she has consistently high HDLs--and mildly elevated alkaline phosphatase-recent liver function tests however were within normal limits --r HDL is quite high at 97--LDL 113-with patient's advanced age will not be real aggressive with this .  #7-depression this appears stable on Celexa .  #8-anemia- suspect there is an element of chronic disease here --she has been stable per recent lab .  #9-history of allergic rhinitis this has been asymptomatic for a while.   10-gout-this is been asymptomatic for a while continue to monitor  [phone]

## 2014-09-29 DIAGNOSIS — I10 Essential (primary) hypertension: Secondary | ICD-10-CM | POA: Diagnosis not present

## 2014-09-30 DIAGNOSIS — F329 Major depressive disorder, single episode, unspecified: Secondary | ICD-10-CM | POA: Diagnosis not present

## 2014-09-30 DIAGNOSIS — F0390 Unspecified dementia without behavioral disturbance: Secondary | ICD-10-CM | POA: Diagnosis not present

## 2014-10-03 ENCOUNTER — Other Ambulatory Visit (HOSPITAL_COMMUNITY)
Admission: RE | Admit: 2014-10-03 | Discharge: 2014-10-03 | Disposition: A | Discharge status: Home / Self Care | Payer: Medicare Other | Source: Ambulatory Visit | Attending: Internal Medicine | Admitting: Internal Medicine

## 2014-10-03 LAB — BASIC METABOLIC PANEL
Anion gap: 5 (ref 5–15)
BUN: 46 mg/dL — ABNORMAL HIGH (ref 6–23)
CALCIUM: 9.2 mg/dL (ref 8.4–10.5)
CO2: 31 mmol/L (ref 19–32)
Chloride: 106 mmol/L (ref 96–112)
Creatinine, Ser: 1.21 mg/dL — ABNORMAL HIGH (ref 0.50–1.10)
GFR, EST AFRICAN AMERICAN: 44 mL/min — AB (ref >90)
GFR, EST NON AFRICAN AMERICAN: 38 mL/min — AB (ref >90)
GLUCOSE: 108 mg/dL — AB (ref 70–99)
Potassium: 4.2 mmol/L (ref 3.5–5.1)
Sodium: 142 mmol/L (ref 135–145)

## 2014-10-09 DIAGNOSIS — B351 Tinea unguium: Secondary | ICD-10-CM | POA: Diagnosis not present

## 2014-10-09 DIAGNOSIS — R609 Edema, unspecified: Secondary | ICD-10-CM | POA: Diagnosis not present

## 2014-10-09 DIAGNOSIS — M79675 Pain in left toe(s): Secondary | ICD-10-CM | POA: Diagnosis not present

## 2014-10-09 DIAGNOSIS — M79674 Pain in right toe(s): Secondary | ICD-10-CM | POA: Diagnosis not present

## 2014-10-09 DIAGNOSIS — M1990 Unspecified osteoarthritis, unspecified site: Secondary | ICD-10-CM | POA: Diagnosis not present

## 2014-10-17 ENCOUNTER — Non-Acute Institutional Stay (SKILLED_NURSING_FACILITY): Payer: Medicare Other | Admitting: Internal Medicine

## 2014-10-17 ENCOUNTER — Ambulatory Visit (HOSPITAL_COMMUNITY): Payer: Medicare Other | Attending: Internal Medicine

## 2014-10-17 ENCOUNTER — Encounter: Payer: Self-pay | Admitting: Internal Medicine

## 2014-10-17 DIAGNOSIS — F0391 Unspecified dementia with behavioral disturbance: Secondary | ICD-10-CM

## 2014-10-17 DIAGNOSIS — R05 Cough: Secondary | ICD-10-CM | POA: Insufficient documentation

## 2014-10-17 DIAGNOSIS — F411 Generalized anxiety disorder: Secondary | ICD-10-CM

## 2014-10-17 DIAGNOSIS — R609 Edema, unspecified: Secondary | ICD-10-CM | POA: Insufficient documentation

## 2014-10-17 DIAGNOSIS — R0989 Other specified symptoms and signs involving the circulatory and respiratory systems: Secondary | ICD-10-CM | POA: Insufficient documentation

## 2014-10-17 DIAGNOSIS — J3089 Other allergic rhinitis: Secondary | ICD-10-CM

## 2014-10-17 DIAGNOSIS — I10 Essential (primary) hypertension: Secondary | ICD-10-CM | POA: Diagnosis not present

## 2014-10-17 DIAGNOSIS — K219 Gastro-esophageal reflux disease without esophagitis: Secondary | ICD-10-CM | POA: Diagnosis not present

## 2014-10-17 DIAGNOSIS — N289 Disorder of kidney and ureter, unspecified: Secondary | ICD-10-CM | POA: Diagnosis not present

## 2014-10-17 DIAGNOSIS — F03918 Unspecified dementia, unspecified severity, with other behavioral disturbance: Secondary | ICD-10-CM

## 2014-10-17 DIAGNOSIS — R059 Cough, unspecified: Secondary | ICD-10-CM

## 2014-10-17 NOTE — Progress Notes (Signed)
Patient ID: [name], female   DOB: October 04, 1921, 81 y.o.   MRN: [id number]   Routine visit.  LOC-skilled.  Facility [facility]   Chief complaint-medical management of dementia renal insufficiency dysphagia hyperlipidemia depression- GERD Acute visit secondary to cough-edema  History of present illness .  Patient is a pleasant elderly resident with the above diagnoses .  She has been quite stable-occasionally will develop some increased lower extremity edema on her Lasix will be increase she is currently on Lasix 40 mg a day 3 days a week and 20 mg on other days this is complicated with some history of renal insufficiency although her most recent creatinine was 1.21 which is good for her.  Nursing staff does note some increased lower extremity edema today as well as some increased coughing and congestion- .  She does not  really complain of any increased shortness of breatfrom baseline---she does have a history of grade 1 diastolic dysfunction although her BNP  in the past has been quite unremarkable.  She does have a history of hypertension  she is on Lotensin 40 mg a day -she is also on low dose Norvasc 5 mg a day--which was increased abouttwo months ago-her blood pressures appear to be relatively stable although variable ranging from 149/79-131/63 recently although her systolic elevations do not appear to be consistent .  It appears that she had been started on Cardizem at one point in the summer but this was DC'd I suspect secondary to some drug interaction-and as well she was started on low dose Norvasc I    Patient is not complaining of any headache dizziness syncopal-type feelings chest pain or increased shortness of breath from baseline She is somewhat anxious and she does have periods of this which is suspect also can increase her blood pressure at times   .  She does have a history of GERD is now on protonicx twice a day-she has complained of somewhat persistent abdominal  discomfort-she has had a GI consult-they ordered a CT scan of the abdomen which did not show any acute process and should showed colonic diverticulosis without evidence of acute diverticulitis-also a fibroid uterus  --  In addition to anxiety she has a history of depressive symptoms-she is on Celexa.--Also was on Depakote as a mood stabilizer which appears to help.    .  . Family medical social history has been reviewed per history and physical on 02/07/2011  .  Medications reviewed per MAR  .  Review of systems.  In general denies any fever or chills. --weight has been stable at 198.6 this is baseline was last month's value--although this was done approximately 2 weeks ago and will need updating .  Head ears eyes nose mouth and throat  Had some history of allergic rhinitis -does not complain of any visual changes  .  Respiratory. Complains of cough productive of clear phlegm-- has some intermittent shortness of breath this has not really new  Cardiac-has some chronic lower extremity edema  increased from baseline today ---- denies any chest pain or palpitations.currently  GI-complains of occasional abdominal discomfort-this is chronic again and has been evaluated by GI does not complaining of acute abdominal discomfort  nausea or vomiting diarrhea or constipation -  Musculoskeletal-does have a history of back pain and osteoarthritis but this is controlled apparently on Tylenol.  Neurologic-does not complaining of headache or dizziness. Or syncopal-type feelings  Psych-does have a history of anxiety depression and mood disorder.  .  Physical exam  Temperature 97.1 pulse 63 respirations 20 blood pressure 149/79 SATURATION 95% on room air weight is 198.7 this will need updating     .  In general this is a pleasant elderly female in no distress  But appears somewhat anxious uncomfortable Her skin is warm and dry. --Has chronic stasis changes of her lower legs bilaterally  I    Eyes-pupils appear equal round reactive light --pu[pils appear reactive to light  t O. pharynx is clear mucous membranes moist  Chest --some  slight diffuse congestion especially when patient takes a deep breath she coughs- no labored breathing.  Heart is regular rate and rhythm she has I would say -3-+ edema with venous stasis changes of her legs bilaterally -this appears  increased from baseline .  Abdomen obese soft nontender with positive bowel sounds==.  Muscle skeletal moves all extremities x4 at baseline does ambulate in a wheelchair I do not note any deformities.  Neurologic-appears grossly intact I do not see a focal weaknesses or lateralizing findings strength appears to be intact on 4 extremities cranial nerves intact her speech is clear  Psych e is oriented to self is conversant and cognizant although does have some confusion at times--he is somewhat anxious.  .  Labs  10/03/2014.  Sodium 142 potassium 4.2 BUN 46 creatinine 1.21  08/21/2014.  WBC 5.0 hemoglobin 12.2 platelets 2:15.  Sodium 140 potassium 4.2 BUN 28 creatinine 1.16-liver function tests within normal limits.  Cholesterol 229-triglycerides 97-HDL 97-LDL 113  05/28/2014.  WBC 4.6 hemoglobin 12.8 platelets 195.  Sodium 141 potassium 4.2 BUN 43 creatinine 1.23.  Liver function tests within normal limits except alkaline phosphatase 131    03/31/2014.  Sodium 143 potassium 4.3 BUN 32 creatinine 1.27.  03/07/2014.  WBC 5.2 hemoglobin 11.6 platelets 193.    02/12/2014.  Sodium 145 potassium 4.4 BUN 45 creatinine 1.32  01/22/2014.  Cholesterol 240 triglycerides 100 HDL 112 LDL 108.  WBC 4.5 hemoglobin 12.2 platelets 178.  Sodium 142 potassium 4.3 BUN 36 creatinine 1.18.  Liver function tests within normal limits except alkaline phosphatase minimally elevated at 138.  01/08/2014.  Sodium 145 potassium 4.3 BUN 39 creatinine 1.39.  12/23/2013.  Sodium 142 potassium 4.3 BUN 35 creatinine 1.16.   12/18/2013.  WBC 5.0 hemoglobin 11.3 platelets 151.  Sodium 144 potassium 3.8 BUN 29 creatinine 1.08.  Liver function tests within normal limits except alkaline phosphatase minimally elevated at 120 bilirubin 0.2 albumin 3.1.  Val acid-40.3.  09/18/2013.  Sodium 142 potassium 4.0 BUN 28 creatinine 1.25.  Liver function tests within normal limits except alkaline phosphatase of 146.  WBC 4.6 hemoglobin 13.6 platelets 173.  Depakote level 54.3.  06/10/2013.  Sodium 142 potassium 4.2 BUN 40 creatinine 1.2 .  05/23/2013.  Sodium 141 potassium 4.2 BUN 36 creatinine 1.33.  Marland Kitchen  03/27/2013.  Cholesterol 254 triglycerides 94-HDL 120-LDL 1:15 3  WBC 4.5 hemoglobin 11.9 platelets 175.  Sodium 140 potassium 4.2 BUN 42 creatinine 1.30.  Liver function tests within normal limits except alkaline phosphatase 156 and albumin of 3.4.  Depakote level was 44.2.  01/09/2013.  WBC 5.4 hemoglobin 11.4 platelets 194.  Sodium 140 potassium 4 BUN 31 creatinine 1.42.  Bilirubin 0.2 alkaline phosphatase 129 albumin 3.2 otherwise liver function tests within normal limits.  Marland Kitchen  11/19/2012  Sodium 139 potassium 4.1 BUN 32 creatinine 1.35.  Liver function tests within normal limits except alkaline phosphatase of 129 an albumin of 3 point.  11/02/2012.  BnP-254.4.  09/10/2012.  W BC 5.0  hemoglobin 11.7 platelets 151.  Valproic acid level-54.4.  07/09/2012.  Cholesterol 226 triglycerides 72 HDL 118 LDL 94    Assessment and plan  .  Number 1--HTN--- currently on Lotensin and Norvasc systolics are still variable at this point will continue to monitor--suspect anxiety today is contributing to this   ...  -  x  #2-dementia-this appears to be relatively mild she continues on Aricept with Depakote as a mood stabilizer.-   #3 CHF--edema---  Currently on Lasix 40-alternating with 20 mg a day with 10 mEq of potassium supplementation -edema appears to be  increased today Will increase Lasix to 40 mg a day also  will increase potassium to 20 mEq a day-her renal function will have to be monitored closely will check a basic metabolic panel tomorrow.  Also will check a BNP with the increased edema-as well as a CBC with differential .  Also weigh patienttomorrow notify provider of gain greater than 3 pounds    #4-GERD--continues on Protonix twice a day-this has been followed by GI-CT scan of the abdomen again appear to be relatively benign-at this point monitor  .  #5 renal insufficiency-this appears to be relatively stable we'll update Metabolic panel. Especially since Lasix was just increased  #6 hyperlipidemia-t On Mevacor-- t--of note she has consistently high HDLs--and mildly elevated alkaline phosphatase-recent liver function tests however were within normal limits --r HDL is quite high at 97--LDL 113-with patient's advanced age will not be real aggressive with this .  #7-depression this appears stable on Celexa .  #8-anemia- suspect there is an element of chronic disease here -will update CBC .  #9-history of allergic rhinitis this has been asymptomatic for a while.   10-gout-this is been asymptomatic for a while continue to monitor   #11-cough with congestion will make duo nebs routine every 6 hours for 48 hours and then when necessary-also will add Mucinex 600 mg when necessary twice a day for 7 days.  Also will obtain a chest x-ray rule out possibly any pulmonary edema or pneumonia   [id number]-of note greater than 35 minutes spent assessing patient-discussing her concerns at bedside AS well as with nursing staff and her daughter who is in the facility--and coordinating plan of care-of note greater than 50% of time spent coordinating plan of ca

## 2014-10-18 ENCOUNTER — Encounter (HOSPITAL_COMMUNITY)
Admission: RE | Admit: 2014-10-18 | Discharge: 2014-10-18 | Disposition: A | Discharge status: Home / Self Care | Payer: Medicare Other | Source: Skilled Nursing Facility | Attending: Internal Medicine | Admitting: Internal Medicine

## 2014-10-18 DIAGNOSIS — F419 Anxiety disorder, unspecified: Secondary | ICD-10-CM | POA: Insufficient documentation

## 2014-10-18 DIAGNOSIS — E785 Hyperlipidemia, unspecified: Secondary | ICD-10-CM | POA: Diagnosis not present

## 2014-10-18 DIAGNOSIS — K219 Gastro-esophageal reflux disease without esophagitis: Secondary | ICD-10-CM | POA: Diagnosis not present

## 2014-10-18 DIAGNOSIS — N289 Disorder of kidney and ureter, unspecified: Secondary | ICD-10-CM | POA: Diagnosis not present

## 2014-10-18 DIAGNOSIS — I10 Essential (primary) hypertension: Secondary | ICD-10-CM | POA: Diagnosis not present

## 2014-10-18 LAB — CBC WITH DIFFERENTIAL/PLATELET
BASOS ABS: 0 10*3/uL (ref 0.0–0.1)
Basophils Relative: 0 % (ref 0–1)
Eosinophils Absolute: 0.1 10*3/uL (ref 0.0–0.7)
Eosinophils Relative: 2 % (ref 0–5)
HEMATOCRIT: 35.7 % — AB (ref 36.0–46.0)
Hemoglobin: 11.3 g/dL — ABNORMAL LOW (ref 12.0–15.0)
Lymphocytes Relative: 38 % (ref 12–46)
Lymphs Abs: 1.9 10*3/uL (ref 0.7–4.0)
MCH: 28.8 pg (ref 26.0–34.0)
MCHC: 31.7 g/dL (ref 30.0–36.0)
MCV: 90.8 fL (ref 78.0–100.0)
Monocytes Absolute: 0.6 10*3/uL (ref 0.1–1.0)
Monocytes Relative: 12 % (ref 3–12)
NEUTROS PCT: 48 % (ref 43–77)
Neutro Abs: 2.4 10*3/uL (ref 1.7–7.7)
Platelets: 178 10*3/uL (ref 150–400)
RBC: 3.93 MIL/uL (ref 3.87–5.11)
RDW: 14.9 % (ref 11.5–15.5)
WBC: 5.1 10*3/uL (ref 4.0–10.5)

## 2014-10-18 LAB — BASIC METABOLIC PANEL
Anion gap: 7 (ref 5–15)
BUN: 31 mg/dL — ABNORMAL HIGH (ref 6–23)
CALCIUM: 8.9 mg/dL (ref 8.4–10.5)
CO2: 30 mmol/L (ref 19–32)
CREATININE: 1.21 mg/dL — AB (ref 0.50–1.10)
Chloride: 105 mmol/L (ref 96–112)
GFR calc non Af Amer: 38 mL/min — ABNORMAL LOW (ref >90)
GFR, EST AFRICAN AMERICAN: 44 mL/min — AB (ref >90)
Glucose, Bld: 87 mg/dL (ref 70–99)
POTASSIUM: 3.9 mmol/L (ref 3.5–5.1)
Sodium: 142 mmol/L (ref 135–145)

## 2014-10-18 LAB — BRAIN NATRIURETIC PEPTIDE: B Natriuretic Peptide: 17 pg/mL (ref 0.0–100.0)

## 2014-10-23 ENCOUNTER — Encounter (HOSPITAL_COMMUNITY)
Admission: RE | Admit: 2014-10-23 | Discharge: 2014-10-23 | Disposition: A | Discharge status: Home / Self Care | Payer: Medicare Other | Source: Skilled Nursing Facility | Attending: Internal Medicine | Admitting: Internal Medicine

## 2014-10-23 DIAGNOSIS — E785 Hyperlipidemia, unspecified: Secondary | ICD-10-CM | POA: Diagnosis not present

## 2014-10-23 LAB — BASIC METABOLIC PANEL
BUN: 40 mg/dL — AB (ref 6–23)
CHLORIDE: 106 mmol/L (ref 96–112)
CO2: 31 mmol/L (ref 19–32)
Calcium: 8.9 mg/dL (ref 8.4–10.5)
Creatinine, Ser: 1.21 mg/dL — ABNORMAL HIGH (ref 0.50–1.10)
GFR calc Af Amer: 44 mL/min — ABNORMAL LOW (ref >90)
GFR calc non Af Amer: 38 mL/min — ABNORMAL LOW (ref >90)
GLUCOSE: 103 mg/dL — AB (ref 70–99)
POTASSIUM: 3.7 mmol/L (ref 3.5–5.1)
SODIUM: 139 mmol/L (ref 135–145)

## 2014-11-03 ENCOUNTER — Encounter (HOSPITAL_COMMUNITY)
Admission: RE | Admit: 2014-11-03 | Discharge: 2014-11-03 | Disposition: A | Discharge status: Home / Self Care | Payer: Medicare Other | Source: Skilled Nursing Facility | Attending: Internal Medicine | Admitting: Internal Medicine

## 2014-11-03 DIAGNOSIS — I10 Essential (primary) hypertension: Secondary | ICD-10-CM | POA: Diagnosis not present

## 2014-11-03 DIAGNOSIS — E785 Hyperlipidemia, unspecified: Secondary | ICD-10-CM | POA: Diagnosis not present

## 2014-11-03 LAB — BASIC METABOLIC PANEL
Anion gap: 6 (ref 5–15)
BUN: 44 mg/dL — AB (ref 6–23)
CO2: 31 mmol/L (ref 19–32)
CREATININE: 1.22 mg/dL — AB (ref 0.50–1.10)
Calcium: 9.3 mg/dL (ref 8.4–10.5)
Chloride: 106 mmol/L (ref 96–112)
GFR calc Af Amer: 44 mL/min — ABNORMAL LOW (ref >90)
GFR calc non Af Amer: 38 mL/min — ABNORMAL LOW (ref >90)
Glucose, Bld: 105 mg/dL — ABNORMAL HIGH (ref 70–99)
Potassium: 4.3 mmol/L (ref 3.5–5.1)
Sodium: 143 mmol/L (ref 135–145)

## 2014-11-04 ENCOUNTER — Non-Acute Institutional Stay (SKILLED_NURSING_FACILITY): Payer: Medicare Other | Admitting: Internal Medicine

## 2014-11-04 DIAGNOSIS — R05 Cough: Secondary | ICD-10-CM

## 2014-11-04 DIAGNOSIS — N289 Disorder of kidney and ureter, unspecified: Secondary | ICD-10-CM

## 2014-11-04 DIAGNOSIS — J3089 Other allergic rhinitis: Secondary | ICD-10-CM

## 2014-11-04 DIAGNOSIS — R059 Cough, unspecified: Secondary | ICD-10-CM

## 2014-11-04 NOTE — Progress Notes (Signed)
Patient ID: [name], female   DOB: [birth year], 81 y.o.   MRN: [id number]  acute  visit.  LOC-skilled.  [facility]   Chief complaint- Acute visit secondary to cough  History of present illness .  Patient is a pleasant elderly resident who has a history of cough and also nasal congestion and allergy symptoms.  According to nursing she has developed a productive cough of relatively clear phlegm.  She also has a history of chronic lower extremity edema which increases at times we recently increased her Lasix to 40 mg a day for short period and reduce this recently to 40 mg every other day all other days 20 mg her weight appears to be stable.  She does not complain of any shortness of breath but the cough does bother her.  It appears she also has a history of chronic allergic rhinitis according in nursing staff the Mucinex has helped in the past-she  has had times nasal and throat drainage  We did do an x-ray recently   which was negative for any acute process.      .  . Family medical social history has been reviewed per history and physical on 02/07/2011  .  Medications reviewed per MAR  .  Review of systems.  In general denies any fever or chills. --weight has gone gone slightly at 193.6-first of the month it was 195.8 last month it had been as high as 198   .  Head ears eyes nose mouth and throat  Had some history of allergic rhinitis -does not complain of any visual changes  .  Respiratory. Complains of cough productive of clear phlegm-- has some intermittent shortness of breath this has not really new  Cardiac-has some chronic lower extremity edema  somewhat reduced from baseline today ---- denies any chest pain or palpitations.currently  GI-complains of occasional abdominal discomfort-this is chronic again and has been evaluated by GI does not complaining of acute abdominal discomfort  nausea or vomiting diarrhea or constipation -    Musculoskeletal-does have a history of back pain and osteoarthritis but this is controlled apparently on Tylenol.  Neurologic-does not complaining of headache or dizziness. Or syncopal-type feelings  Psych-does have a history of anxiety depression and mood disorder.  .   Temperature 98.3 pulse 60 respirations 18 blood pressure 130/81 O2 saturation is 97% on room air weight is 193.8    .  In general this is a pleasant elderly female in no distress  Her skin is warm and dry. --Has chronic stasis changes of her lower legs bilaterally  I  Eyes-pupils appear equal round reactive light --pu[pils appear reactive to light  t O. pharynx is clear mucous membranes moist  Chest --no congestion noted no labored breathing somewhat shallow air entry  Heart is regular rate and rhythm she has I would say -2-+ edema with venous stasis changes of her legs bilaterally -this appears stable to slightly decreased from baseline .  Abdomen obese soft nontender with positive bowel sounds==.  Muscle skeletal moves all extremities x4 at baseline does ambulate in a wheelchair I do not note any deformities.  Neurologic-appears grossly intact I do not see a focal weaknesses or lateralizing findings strength appears to be intact on 4 extremities cranial nerves intact her speech is clear  Psych e is oriented to self is conversant and cognizant although does have some confusion at times-- is somewhat anxious.  .  Labs  11/03/2014.  Sodium 143 potassium  4.3 BUN 44 creatinine 1.22.    10/23/2014.  Sodium 139 potassium 3.7 BUN 40 creatinine 1.2  10/18/2014.  BNP 17.  WBC 5.1 hemoglobin 11.3 platelets 178.    10/03/2014.  Sodium 142 potassium 4.2 BUN 46 creatinine 1.21  08/21/2014.  WBC 5.0 hemoglobin 12.2 platelets 2:15.  Sodium 140 potassium 4.2 BUN 28 creatinine 1.16-liver function tests within normal limits.  Cholesterol 229-triglycerides 97-HDL 97-LDL 113  05/28/2014.  WBC 4.6 hemoglobin  12.8 platelets 195.  Sodium 141 potassium 4.2 BUN 43 creatinine 1.23.  Liver function tests within normal limits except alkaline phosphatase 131    03/31/2014.  Sodium 143 potassium 4.3 BUN 32 creatinine 1.27.  03/07/2014.  WBC 5.2 hemoglobin 11.6 platelets 193.    02/12/2014.  Sodium 145 potassium 4.4 BUN 45 creatinine 1.32  01/22/2014.  Cholesterol 240 triglycerides 100 HDL 112 LDL 108.  WBC 4.5 hemoglobin 12.2 platelets 178.  Sodium 142 potassium 4.3 BUN 36 creatinine 1.18.  Liver function tests within normal limits except alkaline phosphatase minimally elevated at 138.  01/08/2014.  Sodium 145 potassium 4.3 BUN 39 creatinine 1.39.  12/23/2013.  Sodium 142 potassium 4.3 BUN 35 creatinine 1.16.  12/18/2013.  WBC 5.0 hemoglobin 11.3 platelets 151.  Sodium 144 potassium 3.8 BUN 29 creatinine 1.08.  Liver function tests within normal limits except alkaline phosphatase minimally elevated at 120 bilirubin 0.2 albumin 3.1.  Val acid-40.3.  09/18/2013.  Sodium 142 potassium 4.0 BUN 28 creatinine 1.25.  Liver function tests within normal limits except alkaline phosphatase of 146.  WBC 4.6 hemoglobin 13.6 platelets 173.  Depakote level 54.3.  06/10/2013.  Sodium 142 potassium 4.2 BUN 40 creatinine 1.2 .  05/23/2013.  Sodium 141 potassium 4.2 BUN 36 creatinine 1.33.  Marland Kitchen  03/27/2013.  Cholesterol 254 triglycerides 94-HDL 120-LDL 1:15 3  WBC 4.5 hemoglobin 11.9 platelets 175.  Sodium 140 potassium 4.2 BUN 42 creatinine 1.30.  Liver function tests within normal limits except alkaline phosphatase 156 and albumin of 3.4.  Depakote level was 44.2.  01/09/2013.  WBC 5.4 hemoglobin 11.4 platelets 194.  Sodium 140 potassium 4 BUN 31 creatinine 1.42.  Bilirubin 0.2 alkaline phosphatase 129 albumin 3.2 otherwise liver function tests within normal limits.  Marland Kitchen  11/19/2012  Sodium 139 potassium 4.1 BUN 32 creatinine 1.35.  Liver function tests within normal limits except alkaline  phosphatase of 129 an albumin of 3 point.  11/02/2012.  BnP-254.4.  09/10/2012.  W BC 5.0 hemoglobin 11.7 platelets 151.  Valproic acid level-54.4.  07/09/2012.  Cholesterol 226 triglycerides 72 HDL 118 LDL 94    Assessment and plan  .   #1 cough-this could be due to allergic rhinitis she has done well with Mucinex in the past will restart this 600 mg twice a day for 7 days-after that we'll start Claritin to see if this will be a bit more stable on the long-term-also monitor vital signs pulse ox every shift for 48 hours to keep benign on her   ...  -  x  #2-dementia-this appears to be relatively mild she continues on Aricept with Depakote as a mood stabilizer.-   #3 CHF--edema---  Currently on Lasix 40-alternating with 20 mg a day with 10 mEq of potassium supplementation -edema appears to be improved today-weight has gone down a bit BNP was unremarkable as it usually is  \Will update a basic metabolic panel next week secondary to some history of renal insufficiency  [id number]

## 2014-11-08 ENCOUNTER — Encounter: Payer: Self-pay | Admitting: Internal Medicine

## 2014-11-11 ENCOUNTER — Other Ambulatory Visit (HOSPITAL_COMMUNITY)
Admission: AD | Admit: 2014-11-11 | Discharge: 2014-11-11 | Disposition: A | Discharge status: Home / Self Care | Payer: Medicare Other | Source: Skilled Nursing Facility | Attending: Internal Medicine | Admitting: Internal Medicine

## 2014-11-11 DIAGNOSIS — R69 Illness, unspecified: Secondary | ICD-10-CM | POA: Diagnosis not present

## 2014-11-11 LAB — BASIC METABOLIC PANEL
Anion gap: 6 (ref 5–15)
BUN: 31 mg/dL — ABNORMAL HIGH (ref 6–23)
CO2: 30 mmol/L (ref 19–32)
Calcium: 9.3 mg/dL (ref 8.4–10.5)
Chloride: 108 mmol/L (ref 96–112)
Creatinine, Ser: 1.09 mg/dL (ref 0.50–1.10)
GFR calc Af Amer: 50 mL/min — ABNORMAL LOW (ref >90)
GFR calc non Af Amer: 43 mL/min — ABNORMAL LOW (ref >90)
Glucose, Bld: 96 mg/dL (ref 70–99)
Potassium: 3.8 mmol/L (ref 3.5–5.1)
Sodium: 144 mmol/L (ref 135–145)

## 2014-11-12 DIAGNOSIS — F0390 Unspecified dementia without behavioral disturbance: Secondary | ICD-10-CM | POA: Diagnosis not present

## 2014-11-12 DIAGNOSIS — F329 Major depressive disorder, single episode, unspecified: Secondary | ICD-10-CM | POA: Diagnosis not present

## 2014-11-27 DIAGNOSIS — H04123 Dry eye syndrome of bilateral lacrimal glands: Secondary | ICD-10-CM | POA: Diagnosis not present

## 2014-11-27 DIAGNOSIS — Z961 Presence of intraocular lens: Secondary | ICD-10-CM | POA: Diagnosis not present

## 2014-11-27 DIAGNOSIS — H4011X3 Primary open-angle glaucoma, severe stage: Secondary | ICD-10-CM | POA: Diagnosis not present

## 2014-11-27 DIAGNOSIS — H40043 Steroid responder, bilateral: Secondary | ICD-10-CM | POA: Diagnosis not present

## 2014-12-03 ENCOUNTER — Non-Acute Institutional Stay (SKILLED_NURSING_FACILITY): Payer: Medicare Other | Admitting: Internal Medicine

## 2014-12-03 DIAGNOSIS — F0391 Unspecified dementia with behavioral disturbance: Secondary | ICD-10-CM

## 2014-12-03 DIAGNOSIS — R1310 Dysphagia, unspecified: Secondary | ICD-10-CM

## 2014-12-03 DIAGNOSIS — R609 Edema, unspecified: Secondary | ICD-10-CM | POA: Diagnosis not present

## 2014-12-03 DIAGNOSIS — I10 Essential (primary) hypertension: Secondary | ICD-10-CM

## 2014-12-03 DIAGNOSIS — E785 Hyperlipidemia, unspecified: Secondary | ICD-10-CM

## 2014-12-03 DIAGNOSIS — F03918 Unspecified dementia, unspecified severity, with other behavioral disturbance: Secondary | ICD-10-CM

## 2014-12-03 NOTE — Progress Notes (Signed)
Patient ID: [name], female   DOB: 03/22/1922, 81 y.o.   MRN: [id number]       Routine visit.  LOC-skilled.  Facility [facility]   Chief complaint-medical management of dementia renal insufficiency dysphagia hyperlipidemia depression- GERD Acute visit secondary to dysphagia   History of present illness .  Patient is a pleasant elderly resident with the above diagnoses .  She has been relatively stable-occasionally will develop some increased lower extremity edema o she is currently on Lasix 40 mg a day 3 days a week and 20 mg on other days this is complicated with some history of renal insufficiency although her most recent creatinine was 1.09 which is good for her.     She does have a history of hypertension  she is on Lotensin 40 mg a day -she is also on low dose Norvasc 5 mg a day--which was increased about 3  months ago-her blood pressures appear to be relatively stable --129/62-124/64 most recently .  It appears that she had been started on Cardizem at one point in the summer but this was DC'd I suspect secondary to some drug interaction-and as well she was started on low dose Norvasc I    Patient is not complaining of any headache dizziness syncopal-type feelings chest pain or increased shortness of breath from baseline She is somewhat anxious and she does have periods of this which is suspect also can increase her blood pressure at times   .  She does have a history of GERD is now on protonicx twice a day---.  Today her main complaint is dysphagia-she says she has trouble swallowing at times----- in the past-she has had a GI consult-they ordered a CT scan of the abdomen which did not show any acute process and  showed colonic diverticulosis without evidence of acute diverticulitis-also a fibroid uterus In 2012 she did have an EGD which basically showed age-related esophageal motility decline but no acute issue Per nursing staff she ate okay today--they have not really  noted any choking or increased coughing episodes    Regards to her other issues--  she has a history of depressive symptoms-she is on Celexa.--Also was on Depakote as a mood stabilizer which appears to help.    .  . Family medical social history has been reviewed per history and physical on 02/07/2011  .  Medications reviewed per MAR  .  Review of systems.  In general denies any fever or chills. --weight has been stable at 195.8--  .  Head ears eyes nose mouth and throat  Had some history of allergic rhinitis -does not complain of any visual changes  .  Respiratory. Occasional cough- has some intermittent shortness of breath this has not really new  Cardiac----- denies any chest pain or palpitations.currently  GI-complains of occasional abdominal discomfort-this is chronic again and has been evaluated by GI does not complaining of acute abdominal discomfort  She is complaining of some dysphagia o--trouble swallowingat times-- -  Musculoskeletal-does have a history of back pain and osteoarthritis but this is controlled apparently on Tylenol.  Neurologic-does not complaining of headache or dizziness. Or syncopal-type feelings  Psych-does have a history of anxiety depression and mood disorder--this has been relatively stable.  Marland Kitchen  Physical exam    Temperature 97.1 pulse 63 respirations 20 blood pressure 149/79 SATURATION 95% on room air weight is 198.7 this will need updating     .  In general this is a pleasant elderly female in no distress  Her skin is warm and dry. --Has chronic stasis changes of her lower legs bilaterally  I  Eyes-pupils appear equal round reactive light --pu[pils appear reactive to light t Oro. pharynx is clear mucous membranes moist Oropharynx is clear --mucous membranes moist tongue is midline  Chest --clear to auscultation with somewhat shallow air entry- no labored breathing.  Heart is regular rate and rhythm she has I would say -2--+ edema with venous  stasis changes of her legs bilaterally -this appears improved from last exam .  Abdomen obese soft nontender with positive bowel sounds==.  Muscle skeletal moves all extremities x4 at baseline does ambulate in a wheelchair I do not note any deformities.  Neurologic-appears grossly intact I do not see a focal weaknesses or lateralizing findings strength appears to be intact on 4 extremities cranial nerves intact her speech is clear  Psych  is oriented to self is conversant and cognizant although does have some confusion at times--he is somewhat anxious.  .  Labs  11/11/2014.  Sodium 144 potassium 3.8 BUN 31 creatinine 1.09.  10/18/2014.  BNP 17.  WBC 5.1 hemoglobin 11.3 platelets 178    10/03/2014.  Sodium 142 potassium 4.2 BUN 46 creatinine 1.21  08/21/2014.  WBC 5.0 hemoglobin 12.2 platelets 2:15.  Sodium 140 potassium 4.2 BUN 28 creatinine 1.16-liver function tests within normal limits.  Cholesterol 229-triglycerides 97-HDL 97-LDL 113  05/28/2014.  WBC 4.6 hemoglobin 12.8 platelets 195.  Sodium 141 potassium 4.2 BUN 43 creatinine 1.23.  Liver function tests within normal limits except alkaline phosphatase 131    03/31/2014.  Sodium 143 potassium 4.3 BUN 32 creatinine 1.27.  03/07/2014.  WBC 5.2 hemoglobin 11.6 platelets 193.    02/12/2014.  Sodium 145 potassium 4.4 BUN 45 creatinine 1.32  01/22/2014.  Cholesterol 240 triglycerides 100 HDL 112 LDL 108.  WBC 4.5 hemoglobin 12.2 platelets 178.  Sodium 142 potassium 4.3 BUN 36 creatinine 1.18.  Liver function tests within normal limits except alkaline phosphatase minimally elevated at 138.  01/08/2014.  Sodium 145 potassium 4.3 BUN 39 creatinine 1.39.  12/23/2013.  Sodium 142 potassium 4.3 BUN 35 creatinine 1.16.  12/18/2013.  WBC 5.0 hemoglobin 11.3 platelets 151.  Sodium 144 potassium 3.8 BUN 29 creatinine 1.08.  Liver function tests within normal limits except alkaline phosphatase minimally elevated  at 120 bilirubin 0.2 albumin 3.1.  Val acid-40.3.  09/18/2013.  Sodium 142 potassium 4.0 BUN 28 creatinine 1.25.  Liver function tests within normal limits except alkaline phosphatase of 146.  WBC 4.6 hemoglobin 13.6 platelets 173.  Depakote level 54.3.  06/10/2013.  Sodium 142 potassium 4.2 BUN 40 creatinine 1.2 .  05/23/2013.  Sodium 141 potassium 4.2 BUN 36 creatinine 1.33.  Marland Kitchen  03/27/2013.  Cholesterol 254 triglycerides 94-HDL 120-LDL 1:15 3  WBC 4.5 hemoglobin 11.9 platelets 175.  Sodium 140 potassium 4.2 BUN 42 creatinine 1.30.  Liver function tests within normal limits except alkaline phosphatase 156 and albumin of 3.4.  Depakote level was 44.2.  01/09/2013.  WBC 5.4 hemoglobin 11.4 platelets 194.  Sodium 140 potassium 4 BUN 31 creatinine 1.42.  Bilirubin 0.2 alkaline phosphatase 129 albumin 3.2 otherwise liver function tests within normal limits.  Marland Kitchen  11/19/2012  Sodium 139 potassium 4.1 BUN 32 creatinine 1.35.  Liver function tests within normal limits except alkaline phosphatase of 129 an albumin of 3 point.  11/02/2012.  BnP-254.4.  09/10/2012.  W BC 5.0 hemoglobin 11.7 platelets 151.  Valproic acid level-54.4.  07/09/2012.  Cholesterol 226 triglycerides 72 HDL 118 LDL  94    Assessment and plan  .  Number 1--HTN--- currently on Lotensin and Norvasc -appears to be stable   ...  -  x  #2-dementia-this appears to be relatively mild she continues on Aricept with Depakote as a mood stabilizer.-   #3 CHF--edema---  Currently on Lasix 40-alternating with 20 mg a day with 10 mEq of potassium supplementation -edema appears to be stable--her BNP continues to be low-at this point will monitor will update a metabolic panel .    #4-dysphagia-GERD--continues on Protonix twice a day-this has been followed by GI-CT scan of the abdomen again appear to be relatively benign- She is complaining of some difficulty swallowing-I don't believe this is totally new-will order a  speech therapy consult per nursing she stable and eating well-she may need an updated GI consult-- .  #5 renal insufficiency-this appears to be relatively stable we'll update Metabolic panel.   #6 hyperlipidemia-t On Mevacor-- t--of note she has consistently high HDLs--and mildly elevated alkaline phosphatase-recent liver function tests however were within normal limits --r HDL is quite high at 97--LDL 113-with patient's advanced age will not be real aggressive with this-- will update liver function tests .  #7-depression this appears stable on Celexa .  #8-anemia- suspect there is an element of chronic disease here -will update CBC .  #9-history of allergic rhinitis this has been asymptomatic for a while.   10-gout-this is been asymptomatic for a while continue to monitor      [id number]-of note greater than 35 minutes spent assessing patient-discussing her concerns at bedside--reviewing her chart-- discussing her status with nursing staff --and coordinating plan of care-of note greater than 50% of time spent coordinating plan of care

## 2014-12-04 ENCOUNTER — Other Ambulatory Visit (HOSPITAL_COMMUNITY)
Admission: AD | Admit: 2014-12-04 | Discharge: 2014-12-04 | Disposition: A | Discharge status: Home / Self Care | Payer: Medicare Other | Source: Skilled Nursing Facility | Attending: Internal Medicine | Admitting: Internal Medicine

## 2014-12-04 DIAGNOSIS — R69 Illness, unspecified: Secondary | ICD-10-CM | POA: Insufficient documentation

## 2014-12-04 DIAGNOSIS — E785 Hyperlipidemia, unspecified: Secondary | ICD-10-CM | POA: Diagnosis not present

## 2014-12-04 LAB — BASIC METABOLIC PANEL
Anion gap: 7 (ref 5–15)
BUN: 37 mg/dL — ABNORMAL HIGH (ref 6–23)
CO2: 29 mmol/L (ref 19–32)
Calcium: 8.9 mg/dL (ref 8.4–10.5)
Chloride: 104 mmol/L (ref 96–112)
Creatinine, Ser: 1.2 mg/dL — ABNORMAL HIGH (ref 0.50–1.10)
GFR calc Af Amer: 45 mL/min — ABNORMAL LOW (ref >90)
GFR calc non Af Amer: 39 mL/min — ABNORMAL LOW (ref >90)
Glucose, Bld: 89 mg/dL (ref 70–99)
Potassium: 4 mmol/L (ref 3.5–5.1)
Sodium: 140 mmol/L (ref 135–145)

## 2014-12-04 LAB — CBC
HEMATOCRIT: 36.7 % (ref 36.0–46.0)
Hemoglobin: 11.8 g/dL — ABNORMAL LOW (ref 12.0–15.0)
MCH: 29.1 pg (ref 26.0–34.0)
MCHC: 32.2 g/dL (ref 30.0–36.0)
MCV: 90.6 fL (ref 78.0–100.0)
PLATELETS: 197 10*3/uL (ref 150–400)
RBC: 4.05 MIL/uL (ref 3.87–5.11)
RDW: 14.7 % (ref 11.5–15.5)
WBC: 4.4 10*3/uL (ref 4.0–10.5)

## 2014-12-09 ENCOUNTER — Non-Acute Institutional Stay (SKILLED_NURSING_FACILITY): Payer: Medicare Other | Admitting: Internal Medicine

## 2014-12-09 ENCOUNTER — Encounter: Payer: Self-pay | Admitting: Internal Medicine

## 2014-12-09 DIAGNOSIS — N289 Disorder of kidney and ureter, unspecified: Secondary | ICD-10-CM

## 2014-12-09 DIAGNOSIS — R609 Edema, unspecified: Secondary | ICD-10-CM

## 2014-12-09 DIAGNOSIS — L03119 Cellulitis of unspecified part of limb: Secondary | ICD-10-CM | POA: Diagnosis not present

## 2014-12-09 NOTE — Progress Notes (Signed)
Patient ID: [name], female   DOB: 1922/02/06, 81 y.o.   MRN: [id number]  This encounter was created in error - please disregard.

## 2014-12-09 NOTE — Progress Notes (Signed)
Patient ID: [name], female   DOB: 05-12-1922, 81 y.o.   MRN: [id number]  :  ,      This is an acute visit  LOC-skilled.  Facility [facility]   Chief complaint-acute visit secondary to lower extremity edema which appears to be increased-also erythema left lower leg    History of present illness .  Patient is a pleasant elderly resident w -occasionally will develop some increased lower extremity edema o she is currently on Lasix 40 mg a day 3 days a week and 20 mg on other days this is complicated with some history of renal insufficiency although her most recent creatinine was 1.2 which is quite stable for her.  Nursing staff feels she's has some increased edema the last several days weight is 197.6 this appears to be again possibly of about 3-4 pounds over the past couple weeks.  She does not really complain of any increased shortness of breath from baseline.  Nursing staff is also noted some increased erythema of her left lower leg she does have venous stasis changes occasionally this is somewhat complicated with what appears to be mild cellulitis     She does have a history of hypertension  she is on Lotensin 40 mg a day -she is also on low dose Norvasc 5 mg a day--which was increased about 3  months ago-her blood pressures appear to be relatively variable systolics ranging from [id number] generally I do note 156/79 today although this could be anxiety related .  It appears that she had been started on Cardizem at one point in the summer but this was DC'd I suspect secondary to some drug interaction-and as well she was started on low dose Norvasc I        .  . Family medical social history has been reviewed per history and physical on 02/07/2011  .  Medications reviewed per MAR  .  Review of systems.  In general denies any fever or chills. --weight is slightly increased at 197.6-  . Skin is warm and dry do note left lower leg appears to have some mildly increased  erythema from baseline  Head ears eyes nose mouth and throat  Had some history of allergic rhinitis -does not complain of any visual changes  .  Respiratory. Occasional cough- has some intermittent shortness of breath this has not really new  Cardiac----- denies any chest pain or palpitations.currently--nursing staff has noted some increased edema  GI-complains of occasional abdominal discomfort-this is chronic again and has been evaluated by GI does not complaining of acute abdominal discomfort  She continues to complain of occasional dysphagia-although apparently she eats quite well and nursing staff has not noted significant difficulty swallowing-speech therapy consult this pending-- -  Musculoskeletal-does have a history of back pain and osteoarthritis but this is controlled apparently on Tylenol.  Neurologic-does not complaining of headache or dizziness. Or syncopal-type feelings  Psych-does have a history of anxiety depression and mood disorder--this has been relatively stable.  Marland Kitchen  Physical exam    She is afebrile pulse 70 respirations 20 blood pressure 156/79-110/47-in this range     .  In general this is a pleasant elderly female in no distress   Her skin is warm and dry. --Has chronic stasis changes of her lower legs bilaterally--although it appears the left lower leg does have some mildly increased erythema and warmth beyond baseline there's also tenderness to palpation although this is a somewhat chronic  I  Eyes-pupils appear equal  round reactive light --pu[pils appear reactive to light t Oro. pharynx is clear mucous membranes moist Oropharynx is clear --mucous membranes moist tongue is midline  Chest --clear to auscultation with somewhat shallow air entry- no labored breathing.  Heart is regular rate and rhythm she has I would say -2-3--+ edema with venous stasis changes of her legs bilaterally -this appears slightly increased  from last exam .  Abdomen obese soft nontender  with positive bowel sounds==.  Muscle skeletal moves all extremities x4 at baseline does ambulate in a wheelchair I do not note any deformities.  Neurologic-appears grossly intact I do not see a focal weaknesses or lateralizing findings strength appears to be intact on 4 extremities cranial nerves intact her speech is clear  Psych  is oriented to self is conversant and cognizant although does have some confusion at times--he is somewhat anxious.  .  Labs  12/04/2014.  Sodium 140 potassium 4 BUN 37 creatinine 1.2.  WBC 4.4 hemoglobin 11.8 platelets 197  11/11/2014.  Sodium 144 potassium 3.8 BUN 31 creatinine 1.09.  10/18/2014.  BNP 17.  WBC 5.1 hemoglobin 11.3 platelets 178    10/03/2014.  Sodium 142 potassium 4.2 BUN 46 creatinine 1.21  08/21/2014.  WBC 5.0 hemoglobin 12.2 platelets 2:15.  Sodium 140 potassium 4.2 BUN 28 creatinine 1.16-liver function tests within normal limits.  Cholesterol 229-triglycerides 97-HDL 97-LDL 113  05/28/2014.  WBC 4.6 hemoglobin 12.8 platelets 195.  Sodium 141 potassium 4.2 BUN 43 creatinine 1.23.  Liver function tests within normal limits except alkaline phosphatase 131    03/31/2014.  Sodium 143 potassium 4.3 BUN 32 creatinine 1.27.  03/07/2014.  WBC 5.2 hemoglobin 11.6 platelets 193.    02/12/2014.  Sodium 145 potassium 4.4 BUN 45 creatinine 1.32  01/22/2014.  Cholesterol 240 triglycerides 100 HDL 112 LDL 108.  WBC 4.5 hemoglobin 12.2 platelets 178.  Sodium 142 potassium 4.3 BUN 36 creatinine 1.18.  Liver function tests within normal limits except alkaline phosphatase minimally elevated at 138.  01/08/2014.  Sodium 145 potassium 4.3 BUN 39 creatinine 1.39.  12/23/2013.  Sodium 142 potassium 4.3 BUN 35 creatinine 1.16.  12/18/2013.  WBC 5.0 hemoglobin 11.3 platelets 151.  Sodium 144 potassium 3.8 BUN 29 creatinine 1.08.  Liver function tests within normal limits except alkaline phosphatase minimally elevated at 120  bilirubin 0.2 albumin 3.1.  Val acid-40.3.  09/18/2013.  Sodium 142 potassium 4.0 BUN 28 creatinine 1.25.  Liver function tests within normal limits except alkaline phosphatase of 146.  WBC 4.6 hemoglobin 13.6 platelets 173.  Depakote level 54.3.  06/10/2013.  Sodium 142 potassium 4.2 BUN 40 creatinine 1.2 .  05/23/2013.  Sodium 141 potassium 4.2 BUN 36 creatinine 1.33.  Marland Kitchen  03/27/2013.  Cholesterol 254 triglycerides 94-HDL 120-LDL 1:15 3  WBC 4.5 hemoglobin 11.9 platelets 175.  Sodium 140 potassium 4.2 BUN 42 creatinine 1.30.  Liver function tests within normal limits except alkaline phosphatase 156 and albumin of 3.4.  Depakote level was 44.2.  01/09/2013.  WBC 5.4 hemoglobin 11.4 platelets 194.  Sodium 140 potassium 4 BUN 31 creatinine 1.42.  Bilirubin 0.2 alkaline phosphatase 129 albumin 3.2 otherwise liver function tests within normal limits.  Marland Kitchen  11/19/2012  Sodium 139 potassium 4.1 BUN 32 creatinine 1.35.  Liver function tests within normal limits except alkaline phosphatase of 129 an albumin of 3 point.  11/02/2012.  BnP-254.4.  09/10/2012.  W BC 5.0 hemoglobin 11.7 platelets 151.  Valproic acid level-54.4.  07/09/2012.  Cholesterol 226 triglycerides 72 HDL 118 LDL 94  Assessment and plan      #1 CHF--edema---  Currently on Lasix 40-alternating with 20 mg a day with 10 mEq of potassium supplementation -edema appears to be slightly increased along with slight weight gain-will increase her Lasix to 40 mg a day for 3 days and then back to alternating doses-also will change potassium to 20 mEq for 3 days and then back to 10 mEq a day-recheck a BMP on Thursday, April 14 as well as one next week to ensure stability.  Also monitor weights Monday Wednesday and Friday notify provider of gain greater than 3 pounds.  Her BNPs in the past have been consistently low  #2-cellulitis left lower leg will treat with doxycycline 100 mg twice a day for 7 days and  monitor .    #3-dysphagia-GERD--continues on Protonix twice a day-this has been followed by GI-CT scan of the abdomen again appear to be relatively benign- She is complaining of some difficulty swallowing- Other nursing staff feels she is at baseline is eating well-speech therapy consult is pending consider GI consult pending speech therapy results  #4 hypertension she is on Lotensin as well as  5 mgNorvasc she has variable systolics at this point will monitor .  #5 renal insufficiency-this appears to be relatively stable we'll update Metabolic panel.  [id number]

## 2014-12-11 ENCOUNTER — Encounter (HOSPITAL_COMMUNITY)
Admission: AD | Admit: 2014-12-11 | Discharge: 2014-12-11 | Disposition: A | Discharge status: Home / Self Care | Payer: Medicare Other | Source: Skilled Nursing Facility | Attending: Internal Medicine | Admitting: Internal Medicine

## 2014-12-11 DIAGNOSIS — R69 Illness, unspecified: Secondary | ICD-10-CM | POA: Diagnosis not present

## 2014-12-11 LAB — BASIC METABOLIC PANEL
ANION GAP: 8 (ref 5–15)
BUN: 36 mg/dL — ABNORMAL HIGH (ref 6–23)
CO2: 30 mmol/L (ref 19–32)
Calcium: 9 mg/dL (ref 8.4–10.5)
Chloride: 104 mmol/L (ref 96–112)
Creatinine, Ser: 1.31 mg/dL — ABNORMAL HIGH (ref 0.50–1.10)
GFR calc Af Amer: 40 mL/min — ABNORMAL LOW (ref >90)
GFR, EST NON AFRICAN AMERICAN: 35 mL/min — AB (ref >90)
Glucose, Bld: 87 mg/dL (ref 70–99)
Potassium: 4.1 mmol/L (ref 3.5–5.1)
SODIUM: 142 mmol/L (ref 135–145)

## 2014-12-12 DIAGNOSIS — R609 Edema, unspecified: Secondary | ICD-10-CM | POA: Diagnosis not present

## 2014-12-12 DIAGNOSIS — F0390 Unspecified dementia without behavioral disturbance: Secondary | ICD-10-CM | POA: Diagnosis not present

## 2014-12-12 DIAGNOSIS — R1312 Dysphagia, oropharyngeal phase: Secondary | ICD-10-CM | POA: Diagnosis not present

## 2014-12-14 ENCOUNTER — Encounter: Payer: Self-pay | Admitting: Internal Medicine

## 2014-12-14 DIAGNOSIS — L0390 Cellulitis, unspecified: Secondary | ICD-10-CM | POA: Insufficient documentation

## 2014-12-15 ENCOUNTER — Encounter (HOSPITAL_COMMUNITY)
Admission: RE | Admit: 2014-12-15 | Discharge: 2014-12-15 | Disposition: A | Discharge status: Home / Self Care | Payer: Medicare Other | Source: Skilled Nursing Facility | Attending: Internal Medicine | Admitting: Internal Medicine

## 2014-12-15 DIAGNOSIS — R609 Edema, unspecified: Secondary | ICD-10-CM | POA: Diagnosis not present

## 2014-12-15 DIAGNOSIS — R1312 Dysphagia, oropharyngeal phase: Secondary | ICD-10-CM | POA: Diagnosis not present

## 2014-12-15 DIAGNOSIS — F0390 Unspecified dementia without behavioral disturbance: Secondary | ICD-10-CM | POA: Diagnosis not present

## 2014-12-15 DIAGNOSIS — R69 Illness, unspecified: Secondary | ICD-10-CM | POA: Diagnosis not present

## 2014-12-15 LAB — BASIC METABOLIC PANEL
Anion gap: 9 (ref 5–15)
BUN: 49 mg/dL — ABNORMAL HIGH (ref 6–23)
CHLORIDE: 108 mmol/L (ref 96–112)
CO2: 26 mmol/L (ref 19–32)
CREATININE: 1.29 mg/dL — AB (ref 0.50–1.10)
Calcium: 9.3 mg/dL (ref 8.4–10.5)
GFR calc Af Amer: 41 mL/min — ABNORMAL LOW (ref >90)
GFR calc non Af Amer: 35 mL/min — ABNORMAL LOW (ref >90)
GLUCOSE: 93 mg/dL (ref 70–99)
Potassium: 3.9 mmol/L (ref 3.5–5.1)
Sodium: 143 mmol/L (ref 135–145)

## 2014-12-16 DIAGNOSIS — R1312 Dysphagia, oropharyngeal phase: Secondary | ICD-10-CM | POA: Diagnosis not present

## 2014-12-16 DIAGNOSIS — R609 Edema, unspecified: Secondary | ICD-10-CM | POA: Diagnosis not present

## 2014-12-16 DIAGNOSIS — F0390 Unspecified dementia without behavioral disturbance: Secondary | ICD-10-CM | POA: Diagnosis not present

## 2014-12-17 DIAGNOSIS — F0390 Unspecified dementia without behavioral disturbance: Secondary | ICD-10-CM | POA: Diagnosis not present

## 2014-12-17 DIAGNOSIS — R1312 Dysphagia, oropharyngeal phase: Secondary | ICD-10-CM | POA: Diagnosis not present

## 2014-12-17 DIAGNOSIS — R609 Edema, unspecified: Secondary | ICD-10-CM | POA: Diagnosis not present

## 2014-12-18 ENCOUNTER — Encounter (HOSPITAL_COMMUNITY)
Admission: AD | Admit: 2014-12-18 | Discharge: 2014-12-18 | Disposition: A | Discharge status: Home / Self Care | Payer: Medicare Other | Source: Skilled Nursing Facility | Attending: Internal Medicine | Admitting: Internal Medicine

## 2014-12-18 DIAGNOSIS — F0390 Unspecified dementia without behavioral disturbance: Secondary | ICD-10-CM | POA: Diagnosis not present

## 2014-12-18 DIAGNOSIS — R609 Edema, unspecified: Secondary | ICD-10-CM | POA: Diagnosis not present

## 2014-12-18 DIAGNOSIS — R1312 Dysphagia, oropharyngeal phase: Secondary | ICD-10-CM | POA: Diagnosis not present

## 2014-12-18 LAB — BASIC METABOLIC PANEL
ANION GAP: 8 (ref 5–15)
BUN: 57 mg/dL — ABNORMAL HIGH (ref 6–23)
CALCIUM: 9.3 mg/dL (ref 8.4–10.5)
CO2: 26 mmol/L (ref 19–32)
Chloride: 108 mmol/L (ref 96–112)
Creatinine, Ser: 1.32 mg/dL — ABNORMAL HIGH (ref 0.50–1.10)
GFR calc Af Amer: 40 mL/min — ABNORMAL LOW (ref >90)
GFR, EST NON AFRICAN AMERICAN: 34 mL/min — AB (ref >90)
Glucose, Bld: 95 mg/dL (ref 70–99)
POTASSIUM: 4 mmol/L (ref 3.5–5.1)
SODIUM: 142 mmol/L (ref 135–145)

## 2014-12-19 ENCOUNTER — Other Ambulatory Visit (HOSPITAL_COMMUNITY)
Admission: AD | Admit: 2014-12-19 | Discharge: 2014-12-19 | Disposition: A | Discharge status: Home / Self Care | Payer: Medicare Other | Source: Skilled Nursing Facility | Attending: Internal Medicine | Admitting: Internal Medicine

## 2014-12-19 DIAGNOSIS — F0390 Unspecified dementia without behavioral disturbance: Secondary | ICD-10-CM | POA: Diagnosis not present

## 2014-12-19 DIAGNOSIS — R1312 Dysphagia, oropharyngeal phase: Secondary | ICD-10-CM | POA: Diagnosis not present

## 2014-12-19 DIAGNOSIS — R609 Edema, unspecified: Secondary | ICD-10-CM | POA: Diagnosis not present

## 2014-12-20 ENCOUNTER — Encounter (HOSPITAL_COMMUNITY)
Admission: RE | Admit: 2014-12-20 | Discharge: 2014-12-20 | Disposition: A | Discharge status: Home / Self Care | Payer: Medicare Other | Attending: Internal Medicine | Admitting: Internal Medicine

## 2014-12-20 DIAGNOSIS — R69 Illness, unspecified: Secondary | ICD-10-CM | POA: Diagnosis not present

## 2014-12-20 LAB — BASIC METABOLIC PANEL
ANION GAP: 7 (ref 5–15)
BUN: 56 mg/dL — ABNORMAL HIGH (ref 6–23)
CALCIUM: 9.2 mg/dL (ref 8.4–10.5)
CHLORIDE: 107 mmol/L (ref 96–112)
CO2: 28 mmol/L (ref 19–32)
Creatinine, Ser: 1.33 mg/dL — ABNORMAL HIGH (ref 0.50–1.10)
GFR calc Af Amer: 39 mL/min — ABNORMAL LOW (ref >90)
GFR calc non Af Amer: 34 mL/min — ABNORMAL LOW (ref >90)
Glucose, Bld: 89 mg/dL (ref 70–99)
Potassium: 4.1 mmol/L (ref 3.5–5.1)
SODIUM: 142 mmol/L (ref 135–145)

## 2014-12-22 DIAGNOSIS — R1312 Dysphagia, oropharyngeal phase: Secondary | ICD-10-CM | POA: Diagnosis not present

## 2014-12-22 DIAGNOSIS — F0390 Unspecified dementia without behavioral disturbance: Secondary | ICD-10-CM | POA: Diagnosis not present

## 2014-12-22 DIAGNOSIS — R609 Edema, unspecified: Secondary | ICD-10-CM | POA: Diagnosis not present

## 2014-12-23 DIAGNOSIS — F0390 Unspecified dementia without behavioral disturbance: Secondary | ICD-10-CM | POA: Diagnosis not present

## 2014-12-23 DIAGNOSIS — R609 Edema, unspecified: Secondary | ICD-10-CM | POA: Diagnosis not present

## 2014-12-23 DIAGNOSIS — R1312 Dysphagia, oropharyngeal phase: Secondary | ICD-10-CM | POA: Diagnosis not present

## 2014-12-24 DIAGNOSIS — F0390 Unspecified dementia without behavioral disturbance: Secondary | ICD-10-CM | POA: Diagnosis not present

## 2014-12-24 DIAGNOSIS — R1312 Dysphagia, oropharyngeal phase: Secondary | ICD-10-CM | POA: Diagnosis not present

## 2014-12-24 DIAGNOSIS — R609 Edema, unspecified: Secondary | ICD-10-CM | POA: Diagnosis not present

## 2014-12-25 DIAGNOSIS — F0390 Unspecified dementia without behavioral disturbance: Secondary | ICD-10-CM | POA: Diagnosis not present

## 2014-12-25 DIAGNOSIS — F329 Major depressive disorder, single episode, unspecified: Secondary | ICD-10-CM | POA: Diagnosis not present

## 2014-12-29 ENCOUNTER — Encounter (HOSPITAL_COMMUNITY)
Admission: RE | Admit: 2014-12-29 | Discharge: 2014-12-29 | Disposition: A | Discharge status: Home / Self Care | Payer: Medicare Other | Source: Skilled Nursing Facility | Attending: Internal Medicine | Admitting: Internal Medicine

## 2014-12-29 DIAGNOSIS — I509 Heart failure, unspecified: Secondary | ICD-10-CM | POA: Insufficient documentation

## 2014-12-29 DIAGNOSIS — M6281 Muscle weakness (generalized): Secondary | ICD-10-CM | POA: Diagnosis not present

## 2014-12-29 DIAGNOSIS — N189 Chronic kidney disease, unspecified: Secondary | ICD-10-CM | POA: Insufficient documentation

## 2014-12-29 LAB — BASIC METABOLIC PANEL
Anion gap: 7 (ref 5–15)
BUN: 40 mg/dL — ABNORMAL HIGH (ref 6–20)
CHLORIDE: 104 mmol/L (ref 101–111)
CO2: 29 mmol/L (ref 22–32)
CREATININE: 1.28 mg/dL — AB (ref 0.44–1.00)
Calcium: 9 mg/dL (ref 8.9–10.3)
GFR calc Af Amer: 41 mL/min — ABNORMAL LOW (ref >60)
GFR calc non Af Amer: 36 mL/min — ABNORMAL LOW (ref >60)
GLUCOSE: 95 mg/dL (ref 70–99)
POTASSIUM: 4 mmol/L (ref 3.5–5.1)
Sodium: 140 mmol/L (ref 135–145)

## 2015-01-02 ENCOUNTER — Non-Acute Institutional Stay (SKILLED_NURSING_FACILITY): Payer: Medicare Other | Admitting: Internal Medicine

## 2015-01-02 DIAGNOSIS — L03116 Cellulitis of left lower limb: Secondary | ICD-10-CM | POA: Diagnosis not present

## 2015-01-02 DIAGNOSIS — I10 Essential (primary) hypertension: Secondary | ICD-10-CM | POA: Diagnosis not present

## 2015-01-02 DIAGNOSIS — R609 Edema, unspecified: Secondary | ICD-10-CM | POA: Diagnosis not present

## 2015-01-02 NOTE — Progress Notes (Signed)
Patient ID: [name], female   DOB: 1922-02-23, 81 y.o.   MRN: [id number]         This is an acute visit  LOC-skilled.  Facility [facility]   Chief complaint-acute visit secondary to lower extremity edema-also erythema left lower leg    History of present illness .  Patient is a pleasant elderly resident w -occasionally will develop some increased lower extremity edema o she is currently on Lasix 40 mg a day 3 days a week and 20 mg on other days this is complicated with some history of renal insufficiency although her most recent creatinine  on May 2 was 1.28 which is quite stable for her.   The overnight nurse left a note about some increased possibly lower extremity edema and erythema of her lower extremities    she does have venous stasis changes occasionally this is somewhat complicated with what appears to be mild cellulitis  Her most recent weight is 197.8 this is relatively stable on the higher end of her baseline-last time she weighed at this level he did increase her Lasix to 40 mg every day and this appeared to have stabilized.  Again we have been tempering this secondary to her renal insufficiency.       She does have a history of hypertension  she is on Lotensin 40 mg a day -she is also on low dose Norvasc 5 mg a day--which was increased about 4  months ago-her blood pressures appear to be relatively variable systolics ranging from 081-448 generally --I actually got 130/58 tonight-I suspect some of these readings are takenwith a smallercuff, which could lead to higher readings- .  It appears that she had been started on Cardizem at one point in the summer but this was DC'd I suspect secondary to some drug interaction-and as well she was started on low dose Norvasc I        .  . Family medical social history has been reviewed per history and physical on 02/07/2011  .  Medications reviewed per MAR  .  Review of systems.  In general denies any fever or  chills. --weight is slightly increased  again at 197.6-  . Skin is warm and dry do note left lower leg appears to have some mildly increased erythema from baseline  Head ears eyes nose mouth and throat  Had some history of allergic rhinitis -does not complain of any visual changes  .  Respiratory. Occasional cough- has some intermittent shortness of breath this has not really new  Cardiac----- denies any chest pain or palpitations.currently--nursing staff has noted possibly some increased edema  GI-complains of occasional abdominal discomfort-this is chronic again and has been evaluated by GI does not complaining of acute abdominal discomfort   - -  Musculoskeletal-does have a history of back pain and osteoarthritis but this is controlled apparently on Tylenol.  Neurologic-does not complaining of headache or dizziness. Or syncopal-type feelings  Psych-does have a history of anxiety depression and mood disorder--this has been relatively stable.  Marland Kitchen  Physical exam    He is afebrile pulse is 64 respirations 19 blood pressure taken manually 130/58 weight is 197.8 baseline appears to be more in the mid 190s     .  In general this is a pleasant elderly female in no distress   Her skin is warm and dry. --Has chronic stasis changes of her lower legs bilaterally--although it appears the left lower leg does have some mildly increased erythema and warmth  beyond baseline there's also tenderness to palpation although this is a somewhat chronic--this is similar to the last time she presented with what appeared to be some mild cellulitis  I  Eyes-pupils appear equal round reactive light --pu[pils appear reactive to light t Oro. pharynx is clear mucous membranes moist Oropharynx is clear --mucous membranes moist tongue is midline  Chest --clear to auscultation with somewhat shallow air entry- no labored breathing.  Heart is regular rate and rhythm she has I would say -2-3--+ edema with venous stasis  changes of her legs bilaterally -again possibly slightly increased from her baseline .  Abdomen obese soft nontender with positive bowel sounds==.  Muscle skeletal moves all extremities x4 at baseline does ambulate in a wheelchair I do not note any deformities.  Neurologic-appears grossly intact I do not see a focal weaknesses or lateralizing findings strength appears to be intact on 4 extremities cranial nerves intact her speech is clear  Psych  is oriented to self is conversant and cognizant although does have some confusion at times--he is somewhat anxious.  .  Labs  12/29/2014.  Sodium 140 potassium 4.0 BUN 40 creatinine 1.28.    12/04/2014.  Sodium 140 potassium 4 BUN 37 creatinine 1.2.  WBC 4.4 hemoglobin 11.8 platelets 197  11/11/2014.  Sodium 144 potassium 3.8 BUN 31 creatinine 1.09.  10/18/2014.  BNP 17.  WBC 5.1 hemoglobin 11.3 platelets 178    10/03/2014.  Sodium 142 potassium 4.2 BUN 46 creatinine 1.21  08/21/2014.  WBC 5.0 hemoglobin 12.2 platelets 2:15.  Sodium 140 potassium 4.2 BUN 28 creatinine 1.16-liver function tests within normal limits.  Cholesterol 229-triglycerides 97-HDL 97-LDL 113  05/28/2014.  WBC 4.6 hemoglobin 12.8 platelets 195.  Sodium 141 potassium 4.2 BUN 43 creatinine 1.23.  Liver function tests within normal limits except alkaline phosphatase 131    03/31/2014.  Sodium 143 potassium 4.3 BUN 32 creatinine 1.27.  03/07/2014.  WBC 5.2 hemoglobin 11.6 platelets 193.       Assessment and plan      #1 CHF--edema---  Currently on Lasix 40-alternating with 20 mg a day with 10 mEq of potassium supplementation -edema appears to be slightly increased along with slight weight gain-will increase her Lasix to 40 mg a day  And increase potassium 20 meq--in-the past we have done this for short course and then return to baseline but it appears frequently we end up increasing her Lasix again-so far her renal function appears to  be relatively stable so will leave the Lasix at 40 mg a day potassium 20 mEq and updated metabolic panel next week if creatinine tends to rise I suspect we will back off on the Lasix  Her BNPs in the past have been consistently low  #2-cellulitis left lower leg will treat with doxycycline 100 mg twice a day for 7 days and monitor--this appears to have been effective in the past .    #3 hypertension she is on Lotensin as well as  5 mgNorvasc she has variable systolics at this point will monitor .  #4 renal insufficiency-this appears to be relatively stable we'll update Metabolic panel next week in light of the increase in Lasix with potassium.--Also will update BMP in 10 days  [phone]

## 2015-01-15 ENCOUNTER — Encounter (HOSPITAL_COMMUNITY)
Admission: AD | Admit: 2015-01-15 | Discharge: 2015-01-15 | Disposition: A | Discharge status: Home / Self Care | Payer: Medicare Other | Source: Skilled Nursing Facility | Attending: Internal Medicine | Admitting: Internal Medicine

## 2015-01-15 DIAGNOSIS — N189 Chronic kidney disease, unspecified: Secondary | ICD-10-CM | POA: Diagnosis not present

## 2015-01-15 DIAGNOSIS — M6281 Muscle weakness (generalized): Secondary | ICD-10-CM | POA: Diagnosis not present

## 2015-01-15 DIAGNOSIS — I509 Heart failure, unspecified: Secondary | ICD-10-CM | POA: Diagnosis not present

## 2015-01-15 LAB — BASIC METABOLIC PANEL
ANION GAP: 9 (ref 5–15)
BUN: 52 mg/dL — ABNORMAL HIGH (ref 6–20)
CALCIUM: 9.3 mg/dL (ref 8.9–10.3)
CHLORIDE: 106 mmol/L (ref 101–111)
CO2: 27 mmol/L (ref 22–32)
Creatinine, Ser: 1.23 mg/dL — ABNORMAL HIGH (ref 0.44–1.00)
GFR calc Af Amer: 43 mL/min — ABNORMAL LOW (ref >60)
GFR, EST NON AFRICAN AMERICAN: 37 mL/min — AB (ref >60)
Glucose, Bld: 89 mg/dL (ref 65–99)
Potassium: 4.3 mmol/L (ref 3.5–5.1)
Sodium: 142 mmol/L (ref 135–145)

## 2015-01-21 DIAGNOSIS — M79676 Pain in unspecified toe(s): Secondary | ICD-10-CM | POA: Diagnosis not present

## 2015-01-21 DIAGNOSIS — B351 Tinea unguium: Secondary | ICD-10-CM | POA: Diagnosis not present

## 2015-01-21 DIAGNOSIS — G629 Polyneuropathy, unspecified: Secondary | ICD-10-CM | POA: Diagnosis not present

## 2015-01-21 DIAGNOSIS — I739 Peripheral vascular disease, unspecified: Secondary | ICD-10-CM | POA: Diagnosis not present

## 2015-02-05 ENCOUNTER — Encounter (HOSPITAL_COMMUNITY)
Admission: AD | Admit: 2015-02-05 | Discharge: 2015-02-05 | Disposition: A | Discharge status: Home / Self Care | Payer: Medicare Other | Source: Skilled Nursing Facility | Attending: Internal Medicine | Admitting: Internal Medicine

## 2015-02-05 DIAGNOSIS — N189 Chronic kidney disease, unspecified: Secondary | ICD-10-CM | POA: Diagnosis not present

## 2015-02-05 DIAGNOSIS — R609 Edema, unspecified: Secondary | ICD-10-CM | POA: Insufficient documentation

## 2015-02-05 DIAGNOSIS — I509 Heart failure, unspecified: Secondary | ICD-10-CM | POA: Diagnosis not present

## 2015-02-05 LAB — BASIC METABOLIC PANEL
Anion gap: 7 (ref 5–15)
BUN: 29 mg/dL — ABNORMAL HIGH (ref 6–20)
CHLORIDE: 107 mmol/L (ref 101–111)
CO2: 29 mmol/L (ref 22–32)
Calcium: 9.1 mg/dL (ref 8.9–10.3)
Creatinine, Ser: 1.19 mg/dL — ABNORMAL HIGH (ref 0.44–1.00)
GFR, EST AFRICAN AMERICAN: 45 mL/min — AB (ref >60)
GFR, EST NON AFRICAN AMERICAN: 39 mL/min — AB (ref >60)
Glucose, Bld: 87 mg/dL (ref 65–99)
Potassium: 3.8 mmol/L (ref 3.5–5.1)
SODIUM: 143 mmol/L (ref 135–145)

## 2015-02-17 ENCOUNTER — Encounter: Payer: Self-pay | Admitting: Internal Medicine

## 2015-02-17 ENCOUNTER — Non-Acute Institutional Stay (SKILLED_NURSING_FACILITY): Payer: Medicare Other | Admitting: Internal Medicine

## 2015-02-17 DIAGNOSIS — K219 Gastro-esophageal reflux disease without esophagitis: Secondary | ICD-10-CM | POA: Diagnosis not present

## 2015-02-17 DIAGNOSIS — I10 Essential (primary) hypertension: Secondary | ICD-10-CM

## 2015-02-17 DIAGNOSIS — R609 Edema, unspecified: Secondary | ICD-10-CM

## 2015-02-17 DIAGNOSIS — L03116 Cellulitis of left lower limb: Secondary | ICD-10-CM | POA: Diagnosis not present

## 2015-02-17 NOTE — Progress Notes (Signed)
Patient ID: [name], female   DOB: 1922/05/04, 81 y.o.   MRN: [id number]        Routine visit.  LOC-skilled.  Facility [facility]   Chief complaint-medical management of dementia renal insufficiency dysphagia hyperlipidemia depression- GERD Acute visit secondary to increased edema--right leg erythema   History of present illness .  Patient is a pleasant elderly resident with the above diagnoses .  She has been relatively stable- She does have persistent lower extremity edema-last month her Lasix was increased to 40 mg a day routinely and previously of been on 20 mg with occasional short duration of 40 mg a day.  Her kidney function appears to have tolerated this dose increase however lab on 02/05/2015 showed creatinine stable at 1.19 BUN of 29 potassium of 3.8 she is on potassium supplementation as well.  Nursing staff and family has noted what they think is some increased edema the last few days-patient states this is well.  Her weights are taken frequently it appears most recent weight is 203.8 this appears to be on the higher end of her baseline I see a weight a week ago was 202.6-back on June 10 was 199.4-there is some fluctuation here beginning of the month her weight was 200.  Back in April for sure weight was in the high 190s  She does not really complain of any increased shortness of breath from baseline.  Nursing staff also feels possibly she's had some mildly increased erythema and warmth of her left lower leg she has had this in the past and appeared to respond to a short course of anabiotic usually doxycycline.  Her vital signs appear to be stable she has variable blood pressures 144/61-168/70-130/50-I do not see consistent elevations this has often been the case she is anxious which I feel probably contributed somewhat to her elevated readings at times.  She is on Norvasc 5 mg a day as well as Lotensin 40 mg a day.    .  It appears that she had been started on  Cardizem at one point in the summer but this was DC'd I suspect secondary to some drug interaction-and as well she was started on low dose Norvasc I       .  She does have a history of GERD is now on protonicx twice a day---.   staes she has trouble swallowing at times----- in the past-she has had a GI consult-they ordered a CT scan of the abdomen which did not show any acute process and  showed colonic diverticulosis without evidence of acute diverticulitis-also a fibroid uterus In 2012 she did have an EGD which basically showed age-related esophageal motility decline but no acute issue Per nursing staff she is not complaining of dysphagia recently     Regards to her other issues--  she has a history of depressive symptoms-she is on Celexa.--Also was on Depakote as a mood stabilizer which appears to help.    .  . Family medical social history has been reviewed per history and physical on 02/07/2011  .  Medications reviewed per MAR  .  Review of systems.  In general denies any fever or chills. --Appears to have had some mild weight gain-  . Skin does not complain of rashes or itching again appears to have some possibly increased erythema left lower leg  Head ears eyes nose mouth and throat  Had some history of allergic rhinitis -does not complain of any visual changes  .  Respiratory. has some  intermittent shortness of breath this has not really new  Cardiac----- denies any chest pain or palpitations.currently--chronic lower extremity edema possibly as  GI-complains of occasional abdominal discomfort-this is chronic again and has been evaluated by GI does not complaining of acute abdominal discomfort  -- -  Musculoskeletal-does have a history of back pain and osteoarthritis but this is controlled apparently on Tylenol.  Neurologic-does not complaining of headache or dizziness. Or syncopal-type feelings  Psych-does have a history of anxiety depression and mood disorder--this has  been relatively stable.  Marland Kitchen  Physical exam   She has been afebrile pulses 70 respirations 18 blood pressure 144/61 weight is 203.8     .  In general this is a pleasant elderly female in no distress   Her skin is warm and dry. --Has chronic stasis changes of her lower legs bilaterally however lower left leg shin area appears to have somewhat increased erythema and warmth from what I saw previously there is some tenderness to palpation although her lower extremity tenderness is a chronic complaint She has some smaller venous stasis changes to the right shin area but this is more significant on the left  I  Eyes-pupils appear equal round reactive light --pu[pils appear reactive to light t   Oropharynx is clear --mucous membranes moist tongue is midline  Chest --clear to auscultation with somewhat shallow air entry- no labored breathing.  Heart is regular rate and rhythm she has I would say --3--+ edema with venous stasis changes of her legs bilaterally - .  Abdomen obese soft nontender with positive bowel sounds==.  Muscle skeletal moves all extremities x4 at baseline does ambulate in a wheelchair I do not note any deformities.  Neurologic-appears grossly intact I do not see a focal weaknesses or lateralizing findings strength appears to be intact on 4 extremities cranial nerves intact her speech is clear  Psych  is oriented to self is conversant and cognizant although does have some confusion at times-- is somewhat anxious.--But this is not really increased from baseline  .  Labs  02/05/2015.  Sodium 143 potassium 3.8 BUN 29 creatinine 1.19 CO2 29.  12/04/2014.  WBC 4.4 hemoglobin 11.8 platelets 197  11/11/2014.  Sodium 144 potassium 3.8 BUN 31 creatinine 1.09.  10/18/2014.  BNP 17.  WBC 5.1 hemoglobin 11.3 platelets 178    10/03/2014.  Sodium 142 potassium 4.2 BUN 46 creatinine 1.21  08/21/2014.  WBC 5.0 hemoglobin 12.2 platelets 2:15.  Sodium 140 potassium 4.2 BUN  28 creatinine 1.16-liver function tests within normal limits.  Cholesterol 229-triglycerides 97-HDL 97-LDL 113  05/28/2014.  WBC 4.6 hemoglobin 12.8 platelets 195.  Sodium 141 potassium 4.2 BUN 43 creatinine 1.23.  Liver function tests within normal limits except alkaline phosphatase 131    03/31/2014.  Sodium 143 potassium 4.3 BUN 32 creatinine 1.27.  03/07/2014.  WBC 5.2 hemoglobin 11.6 platelets 193.    02/12/2014.  Sodium 145 potassium 4.4 BUN 45 creatinine 1.32  01/22/2014.  Cholesterol 240 triglycerides 100 HDL 112 LDL 108.  WBC 4.5 hemoglobin 12.2 platelets 178.  Sodium 142 potassium 4.3 BUN 36 creatinine 1.18.  Liver function tests within normal limits except alkaline phosphatase minimally elevated at 138.  01/08/2014.  Sodium 145 potassium 4.3 BUN 39 creatinine 1.39.  12/23/2013.  Sodium 142 potassium 4.3 BUN 35 creatinine 1.16.  12/18/2013.  WBC 5.0 hemoglobin 11.3 platelets 151.  Sodium 144 potassium 3.8 BUN 29 creatinine 1.08.  Liver function tests within normal limits except alkaline phosphatase minimally elevated at 120  bilirubin 0.2 albumin 3.1.  Val acid-40.3.  09/18/2013.  Sodium 142 potassium 4.0 BUN 28 creatinine 1.25.  Liver function tests within normal limits except alkaline phosphatase of 146.  WBC 4.6 hemoglobin 13.6 platelets 173.  Depakote level 54.3.  06/10/2013.  Sodium 142 potassium 4.2 BUN 40 creatinine 1.2 .  05/23/2013.  Sodium 141 potassium 4.2 BUN 36 creatinine 1.33.  Marland Kitchen  03/27/2013.  Cholesterol 254 triglycerides 94-HDL 120-LDL 1:15 3  WBC 4.5 hemoglobin 11.9 platelets 175.  Sodium 140 potassium 4.2 BUN 42 creatinine 1.30.  Liver function tests within normal limits except alkaline phosphatase 156 and albumin of 3.4.  Depakote level was 44.2.  01/09/2013.  WBC 5.4 hemoglobin 11.4 platelets 194.  Sodium 140 potassium 4 BUN 31 creatinine 1.42.  Bilirubin 0.2 alkaline phosphatase 129 albumin 3.2 otherwise liver function tests  within normal limits.  Marland Kitchen  11/19/2012  Sodium 139 potassium 4.1 BUN 32 creatinine 1.35.  Liver function tests within normal limits except alkaline phosphatase of 129 an albumin of 3 point.  11/02/2012.  BnP-254.4.  09/10/2012.  W BC 5.0 hemoglobin 11.7 platelets 151.  Valproic acid level-54.4.  07/09/2012.  Cholesterol 226 triglycerides 72 HDL 118 LDL 94    Assessment and plan  .  Number 1--HTN--- currently on Lotensin and Norvasc -that continues to be some variability at this point will monitor-   .Marland Kitchen.  -  x  #2-dementia-this appears to be relatively mild she continues on Aricept with Depakote as a mood stabilizer.-   #3 CHF--edema---  Currently on Lasix 40-milligrams a day-she appears to have had some mild weight gain in increase in edema-Will give her an extra 20 mg a day every afternoon for 3 days-monitor her weights daily notify provider of any weight gain-also will supplement with potassium 30 mEq a day while she is on the higher dose of Lasix.  Also will order a metabolic panel as well as a BMP tomorrow-of note her BNPs in the past have been quite unremarkable-she does have history of diastolic CHF  .    #4-dysphagia-GERD--continues on Protonix twice a day-this has been followed by GI-CT scan of the abdomen again appear to be relatively benign-  She is not really complaining of dysphasia today- .  #5 renal insufficiency-this appears to be relatively stable we'll update Metabolic panel.   #6 hyperlipidemia-t On Mevacor-- t--of note she has consistently high HDLs--and mildly elevated alkaline phosphatase-recent liver function tests however were within normal limits --r HDL is quite high at 97--LDL 113-with patient's advanced age will not be real aggressive with this-- will update liver function tests as well as cholesterol panel .  #7-depression this appears stable on Celexa .  #8-anemia- suspect there is an element of chronic disease here -will update CBC .  #9-history  of allergic rhinitis this has been asymptomatic for a while.--Claritin appears to be effective she is also on Flonase   10-gout-this is been asymptomatic for a while continue to monitor  11-suspected cellulitis left lower leg-will treat with doxycycline 100 mg twice a day for 7 days-have nursing monitor for any increased erythema--she apparently has responded well the past to this.  Also will add a probiotic twice a day for 10 days      [id number]-of note greater than 40 minutes spent assessing patient-discussing her concerns at bedside--reviewing her chart-- discussing her status with nursing staff --and coordinating plan of care-of note greater than 50% of time spent coordinating plan of care

## 2015-02-18 DIAGNOSIS — R609 Edema, unspecified: Secondary | ICD-10-CM | POA: Diagnosis not present

## 2015-02-18 DIAGNOSIS — N189 Chronic kidney disease, unspecified: Secondary | ICD-10-CM | POA: Diagnosis not present

## 2015-02-18 DIAGNOSIS — I509 Heart failure, unspecified: Secondary | ICD-10-CM | POA: Diagnosis not present

## 2015-02-18 LAB — COMPREHENSIVE METABOLIC PANEL
ALK PHOS: 116 U/L (ref 38–126)
ALT: 12 U/L — ABNORMAL LOW (ref 14–54)
AST: 14 U/L — AB (ref 15–41)
Albumin: 4 g/dL (ref 3.5–5.0)
Anion gap: 9 (ref 5–15)
BILIRUBIN TOTAL: 0.6 mg/dL (ref 0.3–1.2)
BUN: 38 mg/dL — ABNORMAL HIGH (ref 6–20)
CALCIUM: 9.3 mg/dL (ref 8.9–10.3)
CO2: 27 mmol/L (ref 22–32)
Chloride: 106 mmol/L (ref 101–111)
Creatinine, Ser: 1.45 mg/dL — ABNORMAL HIGH (ref 0.44–1.00)
GFR calc Af Amer: 35 mL/min — ABNORMAL LOW (ref >60)
GFR, EST NON AFRICAN AMERICAN: 30 mL/min — AB (ref >60)
Glucose, Bld: 88 mg/dL (ref 65–99)
POTASSIUM: 4.3 mmol/L (ref 3.5–5.1)
SODIUM: 142 mmol/L (ref 135–145)
Total Protein: 7.3 g/dL (ref 6.5–8.1)

## 2015-02-18 LAB — CBC WITH DIFFERENTIAL/PLATELET
BASOS PCT: 0 % (ref 0–1)
Basophils Absolute: 0 10*3/uL (ref 0.0–0.1)
Eosinophils Absolute: 0.1 10*3/uL (ref 0.0–0.7)
Eosinophils Relative: 3 % (ref 0–5)
HCT: 38.9 % (ref 36.0–46.0)
Hemoglobin: 12.3 g/dL (ref 12.0–15.0)
Lymphocytes Relative: 42 % (ref 12–46)
Lymphs Abs: 2 10*3/uL (ref 0.7–4.0)
MCH: 28.9 pg (ref 26.0–34.0)
MCHC: 31.6 g/dL (ref 30.0–36.0)
MCV: 91.3 fL (ref 78.0–100.0)
MONOS PCT: 12 % (ref 3–12)
Monocytes Absolute: 0.6 10*3/uL (ref 0.1–1.0)
NEUTROS ABS: 2.1 10*3/uL (ref 1.7–7.7)
Neutrophils Relative %: 43 % (ref 43–77)
PLATELETS: 174 10*3/uL (ref 150–400)
RBC: 4.26 MIL/uL (ref 3.87–5.11)
RDW: 15.2 % (ref 11.5–15.5)
WBC: 4.8 10*3/uL (ref 4.0–10.5)

## 2015-02-18 LAB — BRAIN NATRIURETIC PEPTIDE: B Natriuretic Peptide: 15 pg/mL (ref 0.0–100.0)

## 2015-02-18 LAB — LIPID PANEL
CHOL/HDL RATIO: 2.1 ratio
Cholesterol: 232 mg/dL — ABNORMAL HIGH (ref 0–200)
HDL: 112 mg/dL (ref >40)
LDL Cholesterol: 106 mg/dL — ABNORMAL HIGH (ref 0–99)
Triglycerides: 71 mg/dL (ref <150)
VLDL: 14 mg/dL (ref 0–40)

## 2015-02-24 ENCOUNTER — Non-Acute Institutional Stay (SKILLED_NURSING_FACILITY): Payer: Medicare Other | Admitting: Internal Medicine

## 2015-02-24 DIAGNOSIS — I10 Essential (primary) hypertension: Secondary | ICD-10-CM

## 2015-02-24 DIAGNOSIS — I509 Heart failure, unspecified: Secondary | ICD-10-CM | POA: Diagnosis not present

## 2015-02-24 DIAGNOSIS — N289 Disorder of kidney and ureter, unspecified: Secondary | ICD-10-CM | POA: Diagnosis not present

## 2015-02-24 DIAGNOSIS — L03119 Cellulitis of unspecified part of limb: Secondary | ICD-10-CM

## 2015-02-24 DIAGNOSIS — R609 Edema, unspecified: Secondary | ICD-10-CM | POA: Diagnosis not present

## 2015-02-24 DIAGNOSIS — N189 Chronic kidney disease, unspecified: Secondary | ICD-10-CM | POA: Diagnosis not present

## 2015-02-24 LAB — BASIC METABOLIC PANEL
ANION GAP: 8 (ref 5–15)
BUN: 50 mg/dL — ABNORMAL HIGH (ref 6–20)
CALCIUM: 9.2 mg/dL (ref 8.9–10.3)
CO2: 27 mmol/L (ref 22–32)
Chloride: 109 mmol/L (ref 101–111)
Creatinine, Ser: 1.59 mg/dL — ABNORMAL HIGH (ref 0.44–1.00)
GFR, EST AFRICAN AMERICAN: 32 mL/min — AB (ref >60)
GFR, EST NON AFRICAN AMERICAN: 27 mL/min — AB (ref >60)
Glucose, Bld: 93 mg/dL (ref 65–99)
POTASSIUM: 3.9 mmol/L (ref 3.5–5.1)
SODIUM: 144 mmol/L (ref 135–145)

## 2015-02-24 NOTE — Progress Notes (Signed)
Patient ID: [name], female   DOB: 04-05-1922, 81 y.o.   MRN: [id number]         Routine visit.  LOC-skilled.  Facility [facility]   Chief complaint- Acute visit follow-up edema-possible right lower extremity cellulitis   History of present illness .  Patient is a pleasant elderly resident with the above diagnoses .  She has been relatively stable- She does have persistent lower extremity edema-last month her Lasix was increased to 40 mg a day routinely and previously of been on 20 mg with occasional short duration of 40 mg a day.  Her kidney function appears to have tolerated the dose increase-- lab on 02/05/2015 showed creatinine stable at 1.19 BUN of 29 potassium of 3.8 she is on potassium supplementation as well.   Last week nursing staff and family thought she was having some increased edema-appears she had some mild weight gain up to 203.8 her baseline in the higher 190s to 100 range.  Her Lasix was increased up to 40 mg in the morning 20 mg in the afternoon for 3 days-she is completing a course of this and will be back on Lasix 40 mg a day after 3 days.  Her weight appears to have stabilized today it is 201.4-she does not complain of any increased shortness of breath.  Edema appears to be slightly better although this waxes and wanes.  She was also treated with a short course of doxycycline for possible cellulitis left lower leg-she is finishing a course today-per review this appears to be somewhat improved-I cannot rule out that this is largely venous stasis related will have to be monitored   We did do updated labs shows her creatinine is up a bit from her baseline at 1.59 BUN of 50 she does have a history of renal insufficiency which has been stable for some time-appears baseline creatinine is about 1.3 with some fluctuations.           .  . Family medical social history has been reviewed per history and physical on 02/07/2011  .  Medications reviewed per  MAR  .  Review of systems.  In general denies any fever or chills. --Weight gain appears to have moderated she is now close to her baseline  . Skin does not complain of rashes or itching --erythema left lower leg appears to be somewhat improved  Head ears eyes nose mouth and throat  Had some history of allergic rhinitis -does not complain of any visual changes  .  Respiratory. has some intermittent shortness of breath this has not really new  Cardiac----- denies any chest pain or palpitations.currently--chronic lower extremity edema  GI-complains of occasional abdominal discomfort-this is chronic again and has been evaluated by GI does not complaining of acute abdominal discomfort  -- -  Musculoskeletal-does have a history of back pain and osteoarthritis but this is controlled apparently on Tylenol.  Neurologic-does not complaining of headache or dizziness. Or syncopal-type feelings  Psych-does have a history of anxiety depression and mood disorder--this has been relatively stable.  Marland Kitchen  Physical exam   She is afebrile pulse is 65 respirations 19 blood pressure 129/57-141/61-120/52 most recently-weight is 201.4     .  In general this is a pleasant elderly female in no distress   Her skin is warm and dry. --Has chronic stasis changes of her lower legs bilaterally however lower left leg shin area appears to have somewhat decreased erythema and warmth from what I saw previously there is  some tenderness to palpation although her lower extremity tenderness is a chronic complaint She has some smaller venous stasis changes to the right shin  Tonight the erythema on the left lower extremity appear somewhat similar to that on the right less dramatic than it was last week--I suspect this is residual venous stasis but will have to be monitored  I  Eyes-pupils appear equal round reactive light --pu[pils appear reactive to light t   Oropharynx is clear --mucous membranes moist tongue is midline    Chest --clear to auscultation with somewhat shallow air entry- no labored breathing.  Heart is regular rate and rhythm she has I would say --2-+ edema with venous stasis changes of her legs bilaterally - .  Abdomen obese soft nontender with positive bowel sounds==.  Muscle skeletal moves all extremities x4 at baseline does ambulate in a wheelchair I do not note any deformities.  Neurologic-appears grossly intact I do not see a focal weaknesses or lateralizing findings strength appears to be intact on 4 extremities cranial nerves intact her speech is clear  Psych  is oriented to self is conversant and cognizant although does have some confusion at times-- is somewhat anxious.--But this is not really increased from baseline -infectious appears somewhat less anxious today than she did last week .  Labs  02/24/2015.  Sodium 144 potassium 3.9 BUN 50 creatinine 1.59.  02/18/2015.  Sodium 142 potassium 4.3 BUN 38 creatinine 1.45.  WBC 4.8 hemoglobin 12.3 platelets 174.  BNP only 15  02/05/2015.  Sodium 143 potassium 3.8 BUN 29 creatinine 1.19 CO2 29.  12/04/2014.  WBC 4.4 hemoglobin 11.8 platelets 197  11/11/2014.  Sodium 144 potassium 3.8 BUN 31 creatinine 1.09.  10/18/2014.  BNP 17.  WBC 5.1 hemoglobin 11.3 platelets 178    10/03/2014.  Sodium 142 potassium 4.2 BUN 46 creatinine 1.21  08/21/2014.  WBC 5.0 hemoglobin 12.2 platelets 2:15.  Sodium 140 potassium 4.2 BUN 28 creatinine 1.16-liver function tests within normal limits.  Cholesterol 229-triglycerides 97-HDL 97-LDL 113  05/28/2014.  WBC 4.6 hemoglobin 12.8 platelets 195.  Sodium 141 potassium 4.2 BUN 43 creatinine 1.23.  Liver function tests within normal limits except alkaline phosphatase 131    03/31/2014.  Sodium 143 potassium 4.3 BUN 32 creatinine 1.27.  03/07/2014.  WBC 5.2 hemoglobin 11.6 platelets 193.    02/12/2014.  Sodium 145 potassium 4.4 BUN 45 creatinine 1.32  01/22/2014.   Cholesterol 240 triglycerides 100 HDL 112 LDL 108.  WBC 4.5 hemoglobin 12.2 platelets 178.  Sodium 142 potassium 4.3 BUN 36 creatinine 1.18.  Liver function tests within normal limits except alkaline phosphatase minimally elevated at 138.  01/08/2014.  Sodium 145 potassium 4.3 BUN 39 creatinine 1.39.  12/23/2013.  Sodium 142 potassium 4.3 BUN 35 creatinine 1.16.  12/18/2013.  WBC 5.0 hemoglobin 11.3 platelets 151.  Sodium 144 potassium 3.8 BUN 29 creatinine 1.08.  Liver function tests within normal limits except alkaline phosphatase minimally elevated at 120 bilirubin 0.2 albumin 3.1.  Val acid-40.3.  09/18/2013.  Sodium 142 potassium 4.0 BUN 28 creatinine 1.25.  Liver function tests within normal limits except alkaline phosphatase of 146.  WBC 4.6 hemoglobin 13.6 platelets 173.  Depakote level 54.3.  06/10/2013.  Sodium 142 potassium 4.2 BUN 40 creatinine 1.2 .  05/23/2013.  Sodium 141 potassium 4.2 BUN 36 creatinine 1.33.  Marland Kitchen  03/27/2013.  Cholesterol 254 triglycerides 94-HDL 120-LDL 1:15 3  WBC 4.5 hemoglobin 11.9 platelets 175.  Sodium 140 potassium 4.2 BUN 42 creatinine 1.30.  Liver function  tests within normal limits except alkaline phosphatase 156 and albumin of 3.4.  Depakote level was 44.2.  01/09/2013.  WBC 5.4 hemoglobin 11.4 platelets 194.  Sodium 140 potassium 4 BUN 31 creatinine 1.42.  Bilirubin 0.2 alkaline phosphatase 129 albumin 3.2 otherwise liver function tests within normal limits.  Marland Kitchen  11/19/2012  Sodium 139 potassium 4.1 BUN 32 creatinine 1.35.  Liver function tests within normal limits except alkaline phosphatase of 129 an albumin of 3 point.  11/02/2012.  BnP-254.4.  09/10/2012.  W BC 5.0 hemoglobin 11.7 platelets 151.  Valproic acid level-54.4.  07/09/2012.  Cholesterol 226 triglycerides 72 HDL 118 LDL 94    Assessment and plan  .  Number 1--HTN--- currently on Lotensin and Norvasc -that continues to be some variability at this point will  monitor-actually appears to be somewhat improved recent days at 129/57 141/61-128/52      #2 CHF--edema---  Weights and edema appears to have stabilize slightly improved-she has completed an increased dose of Lasix of 40 mg the morning 20 mg a afternoon-she is back to her baseline 40 mg a day with potassium 20 mEq a day.  At this point will monitor this is a balancing act with her renal insufficiency.  Creatinine has gone up a bit-this probably is due to the Lasix increased.  We will update this next week if continues to rise will reevaluate.  Her BNP continues to be quite unremarkable only 15 on lab done 02/18/2015    #3-history of possible left lower  cellulitis-this appears improved with the doxycycline again cannot rule out there is a venous stasis etiology contributing to the erythema-nonetheless this appears to be more at baseline again we will continue to    [id number] -

## 2015-03-02 ENCOUNTER — Encounter (HOSPITAL_COMMUNITY)
Admission: AD | Admit: 2015-03-02 | Discharge: 2015-03-02 | Disposition: A | Discharge status: Home / Self Care | Payer: Medicare Other | Source: Skilled Nursing Facility | Attending: Internal Medicine | Admitting: Internal Medicine

## 2015-03-02 DIAGNOSIS — R609 Edema, unspecified: Secondary | ICD-10-CM | POA: Insufficient documentation

## 2015-03-02 DIAGNOSIS — N189 Chronic kidney disease, unspecified: Secondary | ICD-10-CM | POA: Diagnosis not present

## 2015-03-02 DIAGNOSIS — I509 Heart failure, unspecified: Secondary | ICD-10-CM | POA: Diagnosis not present

## 2015-03-02 LAB — BASIC METABOLIC PANEL
Anion gap: 8 (ref 5–15)
BUN: 38 mg/dL — AB (ref 6–20)
CO2: 30 mmol/L (ref 22–32)
Calcium: 9.3 mg/dL (ref 8.9–10.3)
Chloride: 105 mmol/L (ref 101–111)
Creatinine, Ser: 1.27 mg/dL — ABNORMAL HIGH (ref 0.44–1.00)
GFR calc Af Amer: 41 mL/min — ABNORMAL LOW (ref >60)
GFR, EST NON AFRICAN AMERICAN: 36 mL/min — AB (ref >60)
Glucose, Bld: 76 mg/dL (ref 65–99)
Potassium: 4 mmol/L (ref 3.5–5.1)
Sodium: 143 mmol/L (ref 135–145)

## 2015-03-18 ENCOUNTER — Non-Acute Institutional Stay (SKILLED_NURSING_FACILITY): Payer: Medicare Other | Admitting: Internal Medicine

## 2015-03-18 DIAGNOSIS — I10 Essential (primary) hypertension: Secondary | ICD-10-CM

## 2015-03-18 DIAGNOSIS — F0391 Unspecified dementia with behavioral disturbance: Secondary | ICD-10-CM

## 2015-03-18 DIAGNOSIS — F03918 Unspecified dementia, unspecified severity, with other behavioral disturbance: Secondary | ICD-10-CM

## 2015-03-18 DIAGNOSIS — F411 Generalized anxiety disorder: Secondary | ICD-10-CM

## 2015-03-18 DIAGNOSIS — K219 Gastro-esophageal reflux disease without esophagitis: Secondary | ICD-10-CM | POA: Diagnosis not present

## 2015-03-18 DIAGNOSIS — R609 Edema, unspecified: Secondary | ICD-10-CM | POA: Diagnosis not present

## 2015-03-18 DIAGNOSIS — N289 Disorder of kidney and ureter, unspecified: Secondary | ICD-10-CM | POA: Diagnosis not present

## 2015-03-18 NOTE — Progress Notes (Signed)
Patient ID: [name], female   DOB: [birth year], 81 y.o.   MRN: [id number]         Routine visit.  LOC-skilled.  Facility [facility]   Chief complaint-medical management of dementia renal insufficiency dysphagia hyperlipidemia depression- GERD    History of present illness .  Patient is a pleasant elderly resident with the above diagnoses .  She has been relatively stable- She does have persistent lower extremity edema- Her Lasix now is routinely at 40 mg a day this was increased from 20 mg alternating with 40 mg-her weight appears to be relatively stable at 203.4 and appears over the past several weeks her weight has very from 200-203.    Her kidney function appears to have tolerated this dose i --She has some history of renal insufficiency but most recent creatinine on 03/02/2015 was 1.27 BUN of 38 which actually is somewhat improved from her baseline  Nursing staff and family has noted  Some increased erythema possibly of her left leg several weeks ago I did give her a short course of doxycycline and this appeared to have improved although cannot totally rule out that this was possibly more  venous stasis etiology.      Her vital signs appear to be stable she continues to have variable blood pressures-158/67-129/58 151/62-138/71-I do not see consistent elevations this has often been the case she is anxious which I feel probably contributed somewhat to her elevated readings at times.  She is on Norvasc 5 mg a day as well as Lotensin 40 mg a day     It a  She does have a history of GERD is now on protonicx twice a day---.   staes she has trouble swallowing at times----- in the past-she has had a GI consult-they ordered a CT scan of the abdomen which did not show any acute process and  showed colonic diverticulosis without evidence of acute diverticulitis-also a fibroid uterus In 2012 she did have an EGD which basically showed age-related esophageal motility decline  but no acute issue Per nursing staff she is not complaining of dysphagia recently     Regards to her other issues--  she has a history of depressive symptoms-she is on Celexa.--Also was on Depakote as a mood stabilizer which appears to help. Apparently family is noticed some increased anxiety at night lately-apparently she had been on Ativan when necessary  but this was discontinued secondary to non- use.      .  . Family medical social history has been reviewed per history and physical on 02/07/2011  .  Medications reviewed per [facility] Aricept 10 mg daily.  Calcium, 500 mg 3 times a day.  Celexa 10 mg daily.  Claritin 10 mg daily.  DuoNeb nebulizers every 6 hours when necessary.  Lasix 40 mg daily.  Lotensin 40 mg daily.  Lovastatin 40 mg daily at bedtime.  Mylanta when necessary.  Vital stat when necessary.  Norvasc 5 mg daily. Plavix 75 mg daily.  Potassium 20 mEq daily.  Protonic 40 mg twice a day.  Restasis eyedrops twice a day.  Robitussin every 6 hours when necessary.  Simbrinza eyedrops 3 times a day.  Tessalon pearls 100 mg twice a day when necessary cough a statin eyedrops 1 drop each eye daily at bedtime.  Tylenol Extra Strength 500 mg 2 tabs twice a day. And once a day when necessary.    .  Review of systems.  In general denies any fever or chills. -Weight appears  to be relatively stable-  . Skin does not complain of rashes or itching  Has venous stasis changes Head ears eyes nose mouth and throat  Had some history of allergic rhinitis -does not complain of any visual changes  .  Respiratory. has some intermittent shortness of breath this has not really new  Cardiac----- denies any chest pain or palpitations.currently--chronic lower extremity edema s  GI-complains of occasional abdominal discomfort-this is chronic again and has been evaluated by GI does not complaining of acute abdominal discomfort  -- -  Musculoskeletal-does have a history  of back pain and osteoarthritis but this is controlled apparently on Tylenol.  Neurologic-does not complaining of headache or dizziness. Or syncopal-type feelings  Psych-does have a history of anxiety depression and mood disorder--this has been relatively stable but has complained of some increased anxiety recently .  Marland Kitchen  Physical exam    She is afebrile pulse 64 respirations 20 blood pressures variable 158/67-129/58 most recently weight is relatively stable at 203.4     .  In general this is a pleasant elderly female in no distress   Her skin is warm and dry. --Has chronic stasis changes of her lower legs bilaterally This is somewhat more on the left versus the right and does not really appear to be changed from baseline I  Eyes-pupils appear equal round reactive light --pu[pils appear reactive to light t   Oropharynx is clear --mucous membranes moist tongue is midline  Chest --clear to auscultation with somewhat shallow air entry- no labored breathing.  Heart is regular rate and rhythm she has I would say -2--3--+ edema with venous stasis changes of her legs bilaterally - .  Abdomen obese soft nontender with positive bowel sounds==.  Muscle skeletal moves all extremities x4 at baseline does ambulate in a wheelchair I do not note any deformities.  Neurologic-appears grossly intact I do not see a focal weaknesses or lateralizing findings strength appears to be intact on 4 extremities cranial nerves intact her speech is clear--touch sensation of her lower extremities and feet appears to be intact  Psych  is oriented to self is conversant and cognizant although does have some confusion at times-- is somewhat anxious.--Per family nursing staff this is somewhat increased more recently-  .  Labs  03/02/2015.  Sodium 143 potassium 4 BUN 38 creatinine 1.27 CO2 30.  02/18/2015.  BNP 15.0.  Sodium 142 potassium 4.3 BUN 38 creatinine 1.45.  AST 14-ALT 12-albumin 4.0.  Cholesterol 232  triglyceride 71 HDL 112 LDL 106.  WBC 4.8 hemoglobin 12.3 platelets 174  02/05/2015.  Sodium 143 potassium 3.8 BUN 29 creatinine 1.19 CO2 29.  12/04/2014.  WBC 4.4 hemoglobin 11.8 platelets 197  11/11/2014.  Sodium 144 potassium 3.8 BUN 31 creatinine 1.09.  10/18/2014.  BNP 17.  WBC 5.1 hemoglobin 11.3 platelets 178    10/03/2014.  Sodium 142 potassium 4.2 BUN 46 creatinine 1.21  08/21/2014.  WBC 5.0 hemoglobin 12.2 platelets 2:15.  Sodium 140 potassium 4.2 BUN 28 creatinine 1.16-liver function tests within normal limits.  Cholesterol 229-triglycerides 97-HDL 97-LDL 113  05/28/2014.  WBC 4.6 hemoglobin 12.8 platelets 195.  Sodium 141 potassium 4.2 BUN 43 creatinine 1.23.  Liver function tests within normal limits except alkaline phosphatase 131    03/31/2014.  Sodium 143 potassium 4.3 BUN 32 creatinine 1.27.  03/07/2014.  WBC 5.2 hemoglobin 11.6 platelets 193.    02/12/2014.  Sodium 145 potassium 4.4 BUN 45 creatinine 1.32  01/22/2014.  Cholesterol 240 triglycerides 100 HDL 112  LDL 108.  WBC 4.5 hemoglobin 12.2 platelets 178.  Sodium 142 potassium 4.3 BUN 36 creatinine 1.18.  Liver function tests within normal limits except alkaline phosphatase minimally elevated at 138.  01/08/2014.  Sodium 145 potassium 4.3 BUN 39 creatinine 1.39.  12/23/2013.  Sodium 142 potassium 4.3 BUN 35 creatinine 1.16.  12/18/2013.  WBC 5.0 hemoglobin 11.3 platelets 151.  Sodium 144 potassium 3.8 BUN 29 creatinine 1.08.  Liver function tests within normal limits except alkaline phosphatase minimally elevated at 120 bilirubin 0.2 albumin 3.1.  Val acid-40.3.  09/18/2013.  Sodium 142 potassium 4.0 BUN 28 creatinine 1.25.  Liver function tests within normal limits except alkaline phosphatase of 146.  WBC 4.6 hemoglobin 13.6 platelets 173.  Depakote level 54.3.  06/10/2013.  Sodium 142 potassium 4.2 BUN 40 creatinine 1.2 .  05/23/2013.  Sodium 141 potassium 4.2 BUN  36 creatinine 1.33.  Marland Kitchen  03/27/2013.  Cholesterol 254 triglycerides 94-HDL 120-LDL 1:15 3  WBC 4.5 hemoglobin 11.9 platelets 175.  Sodium 140 potassium 4.2 BUN 42 creatinine 1.30.  Liver function tests within normal limits except alkaline phosphatase 156 and albumin of 3.4.  Depakote level was 44.2.  01/09/2013.  WBC 5.4 hemoglobin 11.4 platelets 194.  Sodium 140 potassium 4 BUN 31 creatinine 1.42.  Bilirubin 0.2 alkaline phosphatase 129 albumin 3.2 otherwise liver function tests within normal limits.  Marland Kitchen  11/19/2012  Sodium 139 potassium 4.1 BUN 32 creatinine 1.35.  Liver function tests within normal limits except alkaline phosphatase of 129 an albumin of 3 point.  11/02/2012.  BnP-254.4.  09/10/2012.  W BC 5.0 hemoglobin 11.7 platelets 151.  Valproic acid level-54.4.  07/09/2012.  Cholesterol 226 triglycerides 72 HDL 118 LDL 94    Assessment and plan  .  Number 1--HTN--- currently on Lotensin and Norvasc -that continues to be some variability at this point will monitor-   .Marland Kitchen.  -  x  #2-dementia-this appears to be relatively mild she continues on Aricept with Depakote as a mood stabilizer.- Apparently she does have some increased anxiety at night-apparently hearing other residents in the hall makes her somewhat more nervous-will restart her when necessary Ativan  0.25 mg Q 6 hours and monitor   #3 CHF--edema---  Currently on Lasix 40-milligrams a day- As well as potassium 20 mEq day-weight appears to be relatively stable clinically appears to be stable her BNP continues to be quite unremarkable-she does have a history of renal insufficiency Will have this rechecked next lab day  .    #4-dysphagia-GERD--continues on Protonix twice a day-this has been followed by GI-CT scan of the abdomen again appear to be relatively benign-  She is not really complaining of dysphasia today- .  #5 renal insufficiency-this appears to be relatively stable we'll update Metabolic panel.    #6 hyperlipidemia-t On Mevacor-- t--of note she has consistently high HDLs--and mildly elevated alkaline phosphatase-recent liver function tests however were within normal limits --r HDL is quite high at 112--LDL 106-with patient's advanced age will not be real aggressive with this-- Liver function tests last month were fairly unremarkable .  #7-depression this appears stable on Celexa .  #8-anemia- suspect there is an element of chronic disease here -this has been stable to slightly improved with a hemoglobin of 12.3 on lab 02/18/2015 .  #9-history of allergic rhinitis this has been asymptomatic for a while.--Claritin appears to be effective she is also on Flonase   10-gout-this is been asymptomatic for a while continue to monitor  [phone]

## 2015-03-19 DIAGNOSIS — N189 Chronic kidney disease, unspecified: Secondary | ICD-10-CM | POA: Diagnosis not present

## 2015-03-19 DIAGNOSIS — I509 Heart failure, unspecified: Secondary | ICD-10-CM | POA: Diagnosis not present

## 2015-03-19 DIAGNOSIS — R609 Edema, unspecified: Secondary | ICD-10-CM | POA: Diagnosis not present

## 2015-03-19 LAB — BASIC METABOLIC PANEL
ANION GAP: 3 — AB (ref 5–15)
BUN: 41 mg/dL — AB (ref 6–20)
CO2: 26 mmol/L (ref 22–32)
Calcium: 8.7 mg/dL — ABNORMAL LOW (ref 8.9–10.3)
Chloride: 111 mmol/L (ref 101–111)
Creatinine, Ser: 1.54 mg/dL — ABNORMAL HIGH (ref 0.44–1.00)
GFR calc non Af Amer: 28 mL/min — ABNORMAL LOW (ref >60)
GFR, EST AFRICAN AMERICAN: 33 mL/min — AB (ref >60)
GLUCOSE: 100 mg/dL — AB (ref 65–99)
Potassium: 4.4 mmol/L (ref 3.5–5.1)
SODIUM: 140 mmol/L (ref 135–145)

## 2015-03-23 DIAGNOSIS — N189 Chronic kidney disease, unspecified: Secondary | ICD-10-CM | POA: Diagnosis not present

## 2015-03-23 DIAGNOSIS — R609 Edema, unspecified: Secondary | ICD-10-CM | POA: Diagnosis not present

## 2015-03-23 DIAGNOSIS — I509 Heart failure, unspecified: Secondary | ICD-10-CM | POA: Diagnosis not present

## 2015-03-23 LAB — BASIC METABOLIC PANEL
Anion gap: 8 (ref 5–15)
BUN: 44 mg/dL — ABNORMAL HIGH (ref 6–20)
CO2: 26 mmol/L (ref 22–32)
Calcium: 8.9 mg/dL (ref 8.9–10.3)
Chloride: 108 mmol/L (ref 101–111)
Creatinine, Ser: 1.35 mg/dL — ABNORMAL HIGH (ref 0.44–1.00)
GFR calc Af Amer: 39 mL/min — ABNORMAL LOW (ref >60)
GFR calc non Af Amer: 33 mL/min — ABNORMAL LOW (ref >60)
GLUCOSE: 91 mg/dL (ref 65–99)
Potassium: 4 mmol/L (ref 3.5–5.1)
Sodium: 142 mmol/L (ref 135–145)

## 2015-04-01 ENCOUNTER — Other Ambulatory Visit: Payer: Self-pay | Admitting: *Deleted

## 2015-04-01 MED ORDER — LORAZEPAM 0.5 MG PO TABS: ORAL_TABLET | ORAL | Status: DC

## 2015-04-09 DIAGNOSIS — F329 Major depressive disorder, single episode, unspecified: Secondary | ICD-10-CM | POA: Diagnosis not present

## 2015-04-09 DIAGNOSIS — F0390 Unspecified dementia without behavioral disturbance: Secondary | ICD-10-CM | POA: Diagnosis not present

## 2015-04-10 ENCOUNTER — Encounter (HOSPITAL_COMMUNITY)
Admission: AD | Admit: 2015-04-10 | Discharge: 2015-04-10 | Disposition: A | Discharge status: Home / Self Care | Payer: Medicare Other | Source: Skilled Nursing Facility | Attending: Internal Medicine | Admitting: Internal Medicine

## 2015-04-10 DIAGNOSIS — R5383 Other fatigue: Secondary | ICD-10-CM | POA: Insufficient documentation

## 2015-04-10 LAB — TSH: TSH: 1.454 u[IU]/mL (ref 0.350–4.500)

## 2015-04-16 ENCOUNTER — Other Ambulatory Visit: Payer: Self-pay | Admitting: *Deleted

## 2015-04-16 MED ORDER — BENZONATATE 100 MG PO CAPS: 100.0000 mg | ORAL_CAPSULE | Freq: Two times a day (BID) | ORAL | Status: DC | PRN

## 2015-04-16 NOTE — Telephone Encounter (Signed)
[facility] 

## 2015-05-05 ENCOUNTER — Other Ambulatory Visit: Payer: Self-pay | Admitting: *Deleted

## 2015-05-05 MED ORDER — LORAZEPAM 0.5 MG PO TABS: ORAL_TABLET | ORAL | Status: DC

## 2015-05-05 NOTE — Telephone Encounter (Signed)
[facility] 

## 2015-05-19 ENCOUNTER — Encounter: Payer: Self-pay | Admitting: Internal Medicine

## 2015-05-19 ENCOUNTER — Encounter (HOSPITAL_COMMUNITY)
Admission: AD | Admit: 2015-05-19 | Discharge: 2015-05-19 | Disposition: A | Discharge status: Home / Self Care | Payer: Medicare Other | Source: Skilled Nursing Facility | Attending: Internal Medicine | Admitting: Internal Medicine

## 2015-05-19 ENCOUNTER — Non-Acute Institutional Stay (SKILLED_NURSING_FACILITY): Payer: Medicare Other | Admitting: Internal Medicine

## 2015-05-19 DIAGNOSIS — I10 Essential (primary) hypertension: Secondary | ICD-10-CM | POA: Diagnosis not present

## 2015-05-19 DIAGNOSIS — N289 Disorder of kidney and ureter, unspecified: Secondary | ICD-10-CM | POA: Diagnosis not present

## 2015-05-19 DIAGNOSIS — R609 Edema, unspecified: Secondary | ICD-10-CM

## 2015-05-19 DIAGNOSIS — K219 Gastro-esophageal reflux disease without esophagitis: Secondary | ICD-10-CM | POA: Diagnosis not present

## 2015-05-19 DIAGNOSIS — F0391 Unspecified dementia with behavioral disturbance: Secondary | ICD-10-CM | POA: Diagnosis not present

## 2015-05-19 DIAGNOSIS — F03918 Unspecified dementia, unspecified severity, with other behavioral disturbance: Secondary | ICD-10-CM

## 2015-05-19 DIAGNOSIS — Z79899 Other long term (current) drug therapy: Secondary | ICD-10-CM | POA: Diagnosis not present

## 2015-05-19 LAB — COMPREHENSIVE METABOLIC PANEL
ALBUMIN: 4.1 g/dL (ref 3.5–5.0)
ALK PHOS: 130 U/L — AB (ref 38–126)
ALT: 13 U/L — ABNORMAL LOW (ref 14–54)
ANION GAP: 7 (ref 5–15)
AST: 17 U/L (ref 15–41)
BILIRUBIN TOTAL: 0.4 mg/dL (ref 0.3–1.2)
BUN: 50 mg/dL — ABNORMAL HIGH (ref 6–20)
CALCIUM: 8.8 mg/dL — AB (ref 8.9–10.3)
CO2: 27 mmol/L (ref 22–32)
Chloride: 107 mmol/L (ref 101–111)
Creatinine, Ser: 1.61 mg/dL — ABNORMAL HIGH (ref 0.44–1.00)
GFR calc non Af Amer: 27 mL/min — ABNORMAL LOW (ref >60)
GFR, EST AFRICAN AMERICAN: 31 mL/min — AB (ref >60)
Glucose, Bld: 96 mg/dL (ref 65–99)
POTASSIUM: 5.2 mmol/L — AB (ref 3.5–5.1)
SODIUM: 141 mmol/L (ref 135–145)
TOTAL PROTEIN: 7.5 g/dL (ref 6.5–8.1)

## 2015-05-19 LAB — CBC WITH DIFFERENTIAL/PLATELET
BASOS ABS: 0 10*3/uL (ref 0.0–0.1)
BASOS PCT: 0 %
Eosinophils Absolute: 0.1 10*3/uL (ref 0.0–0.7)
Eosinophils Relative: 2 %
HEMATOCRIT: 35 % — AB (ref 36.0–46.0)
HEMOGLOBIN: 12.5 g/dL (ref 12.0–15.0)
LYMPHS PCT: 38 %
Lymphs Abs: 2.1 10*3/uL (ref 0.7–4.0)
MCH: 32.9 pg (ref 26.0–34.0)
MCHC: 35.7 g/dL (ref 30.0–36.0)
MCV: 92.1 fL (ref 78.0–100.0)
Monocytes Absolute: 0.7 10*3/uL (ref 0.1–1.0)
Monocytes Relative: 13 %
NEUTROS ABS: 2.6 10*3/uL (ref 1.7–7.7)
NEUTROS PCT: 47 %
Platelets: 213 10*3/uL (ref 150–400)
RBC: 3.8 MIL/uL — AB (ref 3.87–5.11)
RDW: 15.2 % (ref 11.5–15.5)
WBC: 5.6 10*3/uL (ref 4.0–10.5)

## 2015-05-19 NOTE — Progress Notes (Signed)
Patient ID: [name], female   DOB: [birth year], 81 y.o.   MRN: [id number]          Routine visit.  LOC-skilled.  Facility [facility]   Chief complaint-medical management of dementia renal insufficiency dysphagia hyperlipidemia depression- GERD  Acute visit secondary to nursing and family concerns about edema    History of present illness .  Patient is a pleasant elderly resident with the above diagnoses .  She has been relatively stable- She does have persistent lower extremity edema-and patient and nursing staff feels that this has increased somewhat recently Her Lasix now is routinely at 40 mg a day this was increased from 20 mg alternating with 40 mg Her most recent weight earlier this one shows stability at 201.8 this will need to be updated.  She does not complain of any increased shortness of breath from baseline but is concerned which she is often at times about the edema in her legs-she does have her legs in a dependent position often She does have a history of diastolic CHF however BNPs in the past have consistently been unremarkable    Her kidney function appears to have tolerated this dose most recent creatinine 1.35 BUN of 44 we will update this i --She has some history of renal insufficiency     Her vital signs appear to be stable  She has a history of hypertension but blood pressures appear stable most recently 137/64-115/48-122/42-she is on Norvasc 5 mg a day as well as Lotensin 40 mg a day     It a  She does have a history of GERD is now on protonicx twice a day---.   staes she has trouble swallowing at times----- in the past-she has had a GI consult-they ordered a CT scan of the abdomen which did not show any acute process and  showed colonic diverticulosis without evidence of acute diverticulitis-also a fibroid uterus In 2012 she did have an EGD which basically showed age-related esophageal motility decline but no acute issue Per nursing staff she  is not complaining of dysphagia recently     Regards to her other issues--  she has a history of depressive symptoms-she is on Celexa.--Also was on Depakote as a mood stabilizer which appears to help.  .      .  . Family medical social history has been reviewed per history and physical on 02/07/2011  .  Medications reviewed per [facility] Aricept 10 mg daily.  Calcium, 500 mg 3 times a day.  Celexa 10 mg daily.  Claritin 10 mg daily.  DuoNeb nebulizers every 6 hours when necessary.  Lasix 40 mg daily.  Lotensin 40 mg daily.  Lovastatin 40 mg daily at bedtime.  Mylanta when necessary.  .  Norvasc 5 mg daily  . Plavix 75 mg daily.  Potassium 20 mEq daily.  Protonix 40 mg twice a day.  Restasis eyedrops twice a day.  Robitussin every 6 hours when necessary.  Simbrinza eyedrops 3 times a day.  Tessalon pearls 100 mg twice a day when necessary cough a statin eyedrops 1 drop each eye daily at bedtime.  Tylenol Extra Strength 500 mg 2 tabs twice a day. And once a day when necessary.  Ativan 0.25 mg every 6 hours when necessary anxiety    .  Review of systems.  In general denies any fever or chills. -Weight appears to be relatively stable but this will have to be updated-  . Skin does not complain of rashes or  itching  Has venous stasis changes Head ears eyes nose mouth and throat  Had some history of allergic rhinitis -does not complain of any visual changes  .  Respiratory. has some intermittent shortness of breath this has not really new  Cardiac----- denies any chest pain or palpitations.currently--chronic lower extremity edema --somewhat increased recently according in nursing GI-complains of occasional abdominal discomfort-this is chronic again and has been evaluated by GI does not complaining of acute abdominal discomfort  -- -  Musculoskeletal-does have a history of back pain and osteoarthritis but this is controlled apparently on Tylenol.    Neurologic-does not complaining of headache or dizziness. Or syncopal-type feelings  Psych-does have a history of anxiety depression and mood disorder--this has been relatively stable but has complained of some increased anxiety recently .  Marland Kitchen  Physical exam    Temperature 97.9 pulse 76 respirations 20 blood pressure 137/64 most recent weight 201.8     .  In general this is a pleasant elderly female in no distress   Her skin is warm and dry. --Has chronic stasis changes of her lower legs bilaterally Edema appears to be somewhat increased from baseline  I  Eyes-pupils appear equal round reactive light --pu[pils appear reactive to light t   Oropharynx is clear --mucous membranes moist tongue is midline  Chest --clear to auscultation with somewhat shallow air entry- no labored breathing.  Heart is regular rate and rhythm she has I would say --3--+ edema with venous stasis changes of her legs bilaterally - .  Abdomen obese soft nontender with positive bowel sounds==.  Muscle skeletal moves all extremities x4 at baseline does ambulate in a wheelchair I do not note any deformities.  Neurologic-appears grossly intact I do not see a focal weaknesses or lateralizing findings strength appears to be intact on 4 extremities cranial nerves intact her speech is clear--touch sensation of her lower extremities and feet appears to be intact  Psych  is oriented to self is conversant and cognizant although does have some confusion at times-- is somewhat anxious.---  .  Labs  04/10/2015.  TSH-1.454.  03/23/2015.  Sodium 142 potassium 4 BUN 44 creatinine 1.35 CO2 26.  02/18/2015.  BNP 15.0.  Liver function tests within normal limits except AST 14-ALT 12.  Cholesterol 232 triglyceride 71 HDL 112 LDL 106.  WBC 4.8 hemoglobin 12.3 platelets 174  03/02/2015.  Sodium 143 potassium 4 BUN 38 creatinine 1.27 CO2 30.  02/18/2015.  BNP 15.0.  Sodium 142 potassium 4.3 BUN 38 creatinine  1.45.  AST 14-ALT 12-albumin 4.0.  Cholesterol 232 triglyceride 71 HDL 112 LDL 106.  WBC 4.8 hemoglobin 12.3 platelets 174  02/05/2015.  Sodium 143 potassium 3.8 BUN 29 creatinine 1.19 CO2 29.  12/04/2014.  WBC 4.4 hemoglobin 11.8 platelets 197  11/11/2014.  Sodium 144 potassium 3.8 BUN 31 creatinine 1.09.  10/18/2014.  BNP 17.  WBC 5.1 hemoglobin 11.3 platelets 178    10/03/2014.  Sodium 142 potassium 4.2 BUN 46 creatinine 1.21  08/21/2014.  WBC 5.0 hemoglobin 12.2 platelets 2:15.  Sodium 140 potassium 4.2 BUN 28 creatinine 1.16-liver function tests within normal limits.  Cholesterol 229-triglycerides 97-HDL 97-LDL 113  05/28/2014.  WBC 4.6 hemoglobin 12.8 platelets 195.  Sodium 141 potassium 4.2 BUN 43 creatinine 1.23.  Liver function tests within normal limits except alkaline phosphatase 131    03/31/2014.  Sodium 143 potassium 4.3 BUN 32 creatinine 1.27.  03/07/2014.  WBC 5.2 hemoglobin 11.6 platelets 193.    02/12/2014.  Sodium 145  potassium 4.4 BUN 45 creatinine 1.32  01/22/2014.  Cholesterol 240 triglycerides 100 HDL 112 LDL 108.  WBC 4.5 hemoglobin 12.2 platelets 178.  Sodium 142 potassium 4.3 BUN 36 creatinine 1.18.  Liver function tests within normal limits except alkaline phosphatase minimally elevated at 138.  01/08/2014.  Sodium 145 potassium 4.3 BUN 39 creatinine 1.39.  12/23/2013.  Sodium 142 potassium 4.3 BUN 35 creatinine 1.16.  12/18/2013.  WBC 5.0 hemoglobin 11.3 platelets 151.  Sodium 144 potassium 3.8 BUN 29 creatinine 1.08.  Liver function tests within normal limits except alkaline phosphatase minimally elevated at 120 bilirubin 0.2 albumin 3.1.  Val acid-40.3.  09/18/2013.  Sodium 142 potassium 4.0 BUN 28 creatinine 1.25.  Liver function tests within normal limits except alkaline phosphatase of 146.  WBC 4.6 hemoglobin 13.6 platelets 173.  Depakote level 54.3.  06/10/2013.  Sodium 142 potassium 4.2 BUN 40  creatinine 1.2 .  05/23/2013.  Sodium 141 potassium 4.2 BUN 36 creatinine 1.33.  Marland Kitchen       Assessment and plan  .  Number 1--HTN--- currently on Lotensin and Norvasc - appears to have stabilized recently pressures 137/64-115/48-122/42-   .Marland Kitchen.  -  x  #2-dementia-this appears to be relatively mild she continues on Aricept with Depakote as a mood stabilizer.-     #3 CHF--edema---  Currently on Lasix 40-milligrams a day- As well as potassium 20 mEq day-weight appears to be relatively stable clinically appears to be stable her BNP continues to be quite unremarkable-she does have a history of renal insufficiency   \We will check her metabolic panel stat and if stable suspect will be aggressive  giving her additional diuresis certainly will have to encourage leg elevation as well which a feel is contributing to this-clinically she appears stable  .    #4-dysphagia-GERD--continues on Protonix twice a day-this has been followed by GI-CT scan of the abdomen again appear to be relatively benign-  She is not really complaining of dysphasia today-although she will complain of this intermittently .  #5 renal insufficiency-this appears to be relatively stable we'll update Metabolic panel.   #6 hyperlipidemia-t On Mevacor-- t--of note she has consistently high HDLs--and mildly elevated alkaline phosphatase-recent liver function tests however were within normal limits --r HDL is quite high at 112--LDL 106-with patient's advanced age will not be real aggressive with this-- L .  #7-depression this appears stable on Celexa--she does have periods of anxiety but this appears to be relatively well controlled .  #8-anemia- suspect there is an element of chronic disease here -this has been stable to slightly improved with a hemoglobin of 12.3 on lab 02/18/2015--will update this .  #9-history of allergic rhinitis this has been asymptomatic for a while.--Claritin appears to be effective she is also on  Flonase   10-gout-this is been asymptomatic for a while continue to monitor--she continues to have a sensitivity when her lower extremities are palpated but this does not appear to be any change from her baseline  [id number]--of note greater than 35 minutes spent assessing patient-discussing her concerns at bedside-reviewing her chart and discussing his status with nursing staff ordering in reviewing stat labs-and coordinating and formulating a plan of care-no greater than 50% of time spent coordinating plan of care

## 2015-05-20 ENCOUNTER — Non-Acute Institutional Stay (SKILLED_NURSING_FACILITY): Payer: Medicare Other | Admitting: Internal Medicine

## 2015-05-20 ENCOUNTER — Encounter: Payer: Self-pay | Admitting: Internal Medicine

## 2015-05-20 ENCOUNTER — Ambulatory Visit (HOSPITAL_COMMUNITY)
Admission: RE | Admit: 2015-05-20 | Discharge: 2015-05-20 | Disposition: A | Discharge status: Home / Self Care | Payer: Medicare Other | Source: Ambulatory Visit | Attending: Internal Medicine | Admitting: Internal Medicine

## 2015-05-20 DIAGNOSIS — N289 Disorder of kidney and ureter, unspecified: Secondary | ICD-10-CM | POA: Diagnosis not present

## 2015-05-20 DIAGNOSIS — R609 Edema, unspecified: Secondary | ICD-10-CM

## 2015-05-20 DIAGNOSIS — I517 Cardiomegaly: Secondary | ICD-10-CM | POA: Insufficient documentation

## 2015-05-20 DIAGNOSIS — R0989 Other specified symptoms and signs involving the circulatory and respiratory systems: Secondary | ICD-10-CM | POA: Diagnosis not present

## 2015-05-20 DIAGNOSIS — J690 Pneumonitis due to inhalation of food and vomit: Secondary | ICD-10-CM | POA: Diagnosis not present

## 2015-05-20 NOTE — Progress Notes (Signed)
Patient ID: [name], female   DOB: 11/09/1921, 81 y.o.   MRN: [id number]       Acute visit.  LOC-skilled.  Facility [facility]   Chief complaint- Acute visit follow-up renal insufficiency-question chest congestion-edema-possible weight gain    History of present illness .  Patient is a pleasant elderly resident [place] Levo I saw yesterday for routine visit-she also was noted possibly have some increased edema-this iscomplicated with a history renal insufficiency-per chart review weight she appeared to be stable around 201 202 recently so I ordered weights for today.--Apparently it is 206 today  Also ordered labs-she has a history of renal insufficiency and creatinine was 1.6 with BUN of 50 which is on the upper end of her baseline-potassium also was slightly elevated at 5.2 and I discontinued her potassium last night.  She is on Lasix 40 mg a day we have titrated this up at times secondary to weight gain or edema.  Her nurse today feels she's had some slight chest congestion she did receive nebulizers apparently had relief-    She does have a history of diastolic CHF however BNPs in the past have consistently been unremarkable  When I asked her today she stated she thought her breathing was actually bit better today-           .      .  . Family medical social history has been reviewed per history and physical on 02/07/2011  .  Medications reviewed per [facility] Aricept 10 mg daily.  Calcium, 500 mg 3 times a day.  Celexa 10 mg daily.  Claritin 10 mg daily.  DuoNeb nebulizers every 6 hours when necessary.  Lasix 40 mg daily.  Lotensin 40 mg daily.  Lovastatin 40 mg daily at bedtime.  Mylanta when necessary.  .  Norvasc 5 mg daily  . Plavix 75 mg daily.  Potassium 20 mEq daily.  Protonix 40 mg twice a day.  Restasis eyedrops twice a day.  Robitussin every 6 hours when necessary.  Simbrinza eyedrops 3 times a day.  Tessalon pearls 100 mg twice a  day when necessary cough a statin eyedrops 1 drop each eye daily at bedtime.  Tylenol Extra Strength 500 mg 2 tabs twice a day. And once a day when necessary.  Ativan 0.25 mg every 6 hours when necessary anxiety    .  Review of systems.  In general denies any fever or chills. -Appears per scale rating today she's had some weight gain-  . Skin does not complain of rashes or itching  Has venous stasis changes Head ears eyes nose mouth and throat  Had some history of allergic rhinitis -does not complain of any visual changes  .  Respiratory. has some intermittent shortness of breath this has not really new -- she states she feels her breathing is better today compared to yesterday Cardiac----- denies any chest pain or palpitations.currently--chronic lower extremity edema --somewhat increased recently according in nursing GI-complains of occasional abdominal discomfort-this is chronic again and has been evaluated by GI does not complaining of acute abdominal discomfort  -- -  Musculoskeletal-does have a history of back pain and osteoarthritis but this is controlled apparently on Tylenol.  Neurologic-does not complaining of headache or dizziness. Or syncopal-type feelings  Psych-does have a history of anxiety depression and mood disorder--this has been relatively stable but has complained of some increased anxiety recently .  Marland Kitchen  Physical exam    She is afebrile pulse of 82 respirations 22 blood  pressure 140/60 O2 saturation is in the 90s on room air weight is 206 this appears to be again of possibly about 4 pounds from her baseline   .  In general this is a pleasant elderly female in no distress   Her skin is warm and dry. --Has chronic stasis changes of her lower legs bilaterally Edema appears to be somewhat increased from baseline relatively unchanged from yesterday  I  Eyes-pupils appear equal round reactive light --pu[pils appear reactive to light t   Oropharynx is clear --mucous  membranes moist tongue is midline  Chest --very small amount of expiratory wheezing diffuse with somewhat shallow air entry- no labored breathing.  Heart is regular rate and rhythm she has I would say --3--+ edema with venous stasis changes of her legs bilaterally - .  Abdomen obese soft nontender with positive bowel sounds==.  Muscle skeletal moves all extremities x4 at baseline does ambulate in a wheelchair I do not note any deformities.  Neurologic-appears grossly intact I do not see a focal weaknesses or lateralizing findings strength appears to be intact on 4 extremities cranial nerves intact her speech is clear- t  Psych  is oriented to self is conversant and cognizant although does have some confusion at times-- is somewhat anxious.---  .  Labs  05/19/2015.  Sodium 141 potassium 5.2 BUN 50 creatinine 1.6.  ALT 13 alkaline phosphatase 1:30 otherwise liver function tests within normal limits.  WBC 5.6 hemoglobin 12.5 platelets 213    04/10/2015.  TSH-1.454.  03/23/2015.  Sodium 142 potassium 4 BUN 44 creatinine 1.35 CO2 26.  02/18/2015.  BNP 15.0.  Liver function tests within normal limits except AST 14-ALT 12.  Cholesterol 232 triglyceride 71 HDL 112 LDL 106.  WBC 4.8 hemoglobin 12.3 platelets 174  03/02/2015.  Sodium 143 potassium 4 BUN 38 creatinine 1.27 CO2 30.  02/18/2015.  BNP 15.0.  Sodium 142 potassium 4.3 BUN 38 creatinine 1.45.  AST 14-ALT 12-albumin 4.0.  Cholesterol 232 triglyceride 71 HDL 112 LDL 106.  WBC 4.8 hemoglobin 12.3 platelets 174  02/05/2015.  Sodium 143 potassium 3.8 BUN 29 creatinine 1.19 CO2 29.  12/04/2014.  WBC 4.4 hemoglobin 11.8 platelets 197  11/11/2014.  Sodium 144 potassium 3.8 BUN 31 creatinine 1.09.  10/18/2014.  BNP 17.  WBC 5.1 hemoglobin 11.3 platelets 178    10/03/2014.  Sodium 142 potassium 4.2 BUN 46 creatinine 1.21  08/21/2014.  WBC 5.0 hemoglobin 12.2 platelets 2:15.  Sodium 140  potassium 4.2 BUN 28 creatinine 1.16-liver function tests within normal limits.  Cholesterol 229-triglycerides 97-HDL 97-LDL 113  05/28/2014.  WBC 4.6 hemoglobin 12.8 platelets 195.  Sodium 141 potassium 4.2 BUN 43 creatinine 1.23.  Liver function tests within normal limits except alkaline phosphatase 131    03/31/2014.  Sodium 143 potassium 4.3 BUN 32 creatinine 1.27.  03/07/2014.  WBC 5.2 hemoglobin 11.6 platelets 193.    02/12/2014.  Sodium 145 potassium 4.4 BUN 45 creatinine 1.32  01/22/2014.  Cholesterol 240 triglycerides 100 HDL 112 LDL 108.  WBC 4.5 hemoglobin 12.2 platelets 178.  Sodium 142 potassium 4.3 BUN 36 creatinine 1.18.  Liver function tests within normal limits except alkaline phosphatase minimally elevated at 138.  01/08/2014.  Sodium 145 potassium 4.3 BUN 39 creatinine 1.39.  12/23/2013.  Sodium 142 potassium 4.3 BUN 35 creatinine 1.16.  12/18/2013.  WBC 5.0 hemoglobin 11.3 platelets 151.  Sodium 144 potassium 3.8 BUN 29 creatinine 1.08.  Liver function tests within normal limits except alkaline phosphatase minimally elevated at 120  bilirubin 0.2 albumin 3.1.  Val acid-40.3.  09/18/2013.  Sodium 142 potassium 4.0 BUN 28 creatinine 1.25.  Liver function tests within normal limits except alkaline phosphatase of 146.  WBC 4.6 hemoglobin 13.6 platelets 173.  Depakote level 54.3.  06/10/2013.  Sodium 142 potassium 4.2 BUN 40 creatinine 1.2 .  05/23/2013.  Sodium 141 potassium 4.2 BUN 36 creatinine 1.33.  Marland Kitchen       Assessment and plan  .        #1 CHF--edema---  Currently on Lasix 40-milligrams a day-potassiums been DC'd secondary to mild hyperkalemia.  Appear she has gained some weight-this is complicated with her history of renal insufficiency with a creatinine 1.6 which is on the higher end of her baseline.  She does have some slight chest congestion today Will obtain a chest x-ray-to look for any possible pulmonary edema  pneumonia-suspect he will have to increase her Lasix but would like to see what the chest x-ray tells [place]  Clinically she does not appear to be unstable    [id number].

## 2015-05-22 ENCOUNTER — Encounter (HOSPITAL_COMMUNITY)
Admission: AD | Admit: 2015-05-22 | Discharge: 2015-05-22 | Disposition: A | Discharge status: Home / Self Care | Payer: Medicare Other | Source: Skilled Nursing Facility | Attending: Internal Medicine | Admitting: Internal Medicine

## 2015-05-22 ENCOUNTER — Non-Acute Institutional Stay (SKILLED_NURSING_FACILITY): Payer: Medicare Other | Admitting: Internal Medicine

## 2015-05-22 ENCOUNTER — Ambulatory Visit (HOSPITAL_COMMUNITY)
Admit: 2015-05-22 | Discharge: 2015-05-22 | Disposition: A | Discharge status: Home / Self Care | Payer: Medicare Other | Attending: Internal Medicine | Admitting: Internal Medicine

## 2015-05-22 DIAGNOSIS — N289 Disorder of kidney and ureter, unspecified: Secondary | ICD-10-CM | POA: Diagnosis not present

## 2015-05-22 DIAGNOSIS — R609 Edema, unspecified: Secondary | ICD-10-CM

## 2015-05-22 DIAGNOSIS — M791 Myalgia: Secondary | ICD-10-CM | POA: Insufficient documentation

## 2015-05-22 DIAGNOSIS — I82409 Acute embolism and thrombosis of unspecified deep veins of unspecified lower extremity: Secondary | ICD-10-CM | POA: Insufficient documentation

## 2015-05-22 DIAGNOSIS — R600 Localized edema: Secondary | ICD-10-CM | POA: Diagnosis not present

## 2015-05-22 DIAGNOSIS — I82412 Acute embolism and thrombosis of left femoral vein: Secondary | ICD-10-CM | POA: Insufficient documentation

## 2015-05-22 DIAGNOSIS — I82402 Acute embolism and thrombosis of unspecified deep veins of left lower extremity: Secondary | ICD-10-CM | POA: Diagnosis not present

## 2015-05-22 DIAGNOSIS — Z79899 Other long term (current) drug therapy: Secondary | ICD-10-CM | POA: Diagnosis not present

## 2015-05-22 LAB — BASIC METABOLIC PANEL
Anion gap: 8 (ref 5–15)
BUN: 44 mg/dL — AB (ref 6–20)
CO2: 28 mmol/L (ref 22–32)
CREATININE: 1.57 mg/dL — AB (ref 0.44–1.00)
Calcium: 8.7 mg/dL — ABNORMAL LOW (ref 8.9–10.3)
Chloride: 107 mmol/L (ref 101–111)
GFR calc Af Amer: 32 mL/min — ABNORMAL LOW (ref >60)
GFR, EST NON AFRICAN AMERICAN: 28 mL/min — AB (ref >60)
Glucose, Bld: 97 mg/dL (ref 65–99)
Potassium: 3.9 mmol/L (ref 3.5–5.1)
SODIUM: 143 mmol/L (ref 135–145)

## 2015-05-22 NOTE — Progress Notes (Signed)
Patient ID: [name], female   DOB: [birth year], 81 y.o.   MRN: [id number]        Acute visit.  LOC-skilled.  Facility [facility]   Chief complaint- Acute visit follow-up -weight gain-lower extremity edema    History of present illness .  Patient is a pleasant elderly resident [place] Levo I saw earkiuer this week for routine visit-she also was noted possibly have some increased edema-this iscomplicated with a history renal insufficiency-per chart review weight she appeared to be stable around 201 202 recently so I ordered weights and it was up to 206   Also ordered labs-she has a history of renal insufficiency and creatinine was 1.6 with BUN of 50 which is on the upper end of her baseline-potassium also was slightly elevated at 5.2 and I discontinued her potassium.  She is on Lasix 40 mg a day we have titrated this up at times secondary to weight gain or edema.      She does have a history of diastolic CHF however BNPs in the past have consistently been unremarkable  Her nurse was concerned about some intermittent chest congestion earlier this week and a chest x-ray was ordered which actually came back not showing any acute process.  At that point decision was made to monitor her weight gain and clinical status which appear to be stable.  Apparently subsequent weight has shown consistency with a weight of 206 also yesterday nursing staff thought edema with somewhat increased especially in the left lower extremity and she received an x-ray dose of Lasix.  Today the edema appears to be improved her weight also has come down a couple pounds she does not complain of any increased shortness of breath from baseline.  However a Doppler was obtained of her left lower leg and those results are pending to rule out any DVT   -           .      .  . Family medical social history has been reviewed per history and physical on 02/07/2011  .  Medications reviewed per [facility] Aricept  10 mg daily.  Calcium, 500 mg 3 times a day.  Celexa 10 mg daily.  Claritin 10 mg daily.  DuoNeb nebulizers every 6 hours when necessary.  Lasix 40 mg daily.  Lotensin 40 mg daily.  Lovastatin 40 mg daily at bedtime.  Mylanta when necessary.  .  Norvasc 5 mg daily  . Plavix 75 mg daily.  Protonix 40 mg twice a day.  Restasis eyedrops twice a day.  Robitussin every 6 hours when necessary.  Simbrinza eyedrops 3 times a day.  Tessalon pearls 100 mg twice a day when necessary cough a statin eyedrops 1 drop each eye daily at bedtime.  Tylenol Extra Strength 500 mg 2 tabs twice a day. And once a day when necessary.  Ativan 0.25 mg every 6 hours when necessary anxiety    .  Review of systems.  In general denies any fever or chills. -Appears  weight now is more towards her baseline which is in the low twos she lost couple pounds today . Skin does not complain of rashes or itching  Has venous stasis changes Head ears eyes nose mouth and throat  Had some history of allergic rhinitis -does not complain of any visual changes  .  Respiratory. has some intermittent shortness of breath this has not really new -- she states she feels her breathing is baseline today compared to yesterday Cardiac----- denies  any chest pain or palpitations.currently--chronic lower extremity edema -ears improved today compared to earlier this week  GI-complains of occasional abdominal discomfort-this is chronic again and has been evaluated by GI does not complaining of acute abdominal discomfort  -- -  Musculoskeletal-does have a history of back pain and osteoarthritis but this is controlled apparently on Tylenol.  Neurologic-does not complaining of headache or dizziness. Or syncopal-type feelings  Psych-does have a history of anxiety depression and mood disorder--this has been relatively stable .  Marland Kitchen  Physical exam    Temperature 98.6 pulse 80 respirations 19 blood pressure 145/56-140/60-I  also see 99/50-this is quite variable I do not see consistent elevations her weight is 204.8 this is down a couple pounds from earlier this week her baseline appears to be in the 201-203 area   .  In general this is a pleasant elderly female in no distress   Her skin is warm and dry. --Has chronic stasis changes of her lower legs bilaterally Edema appears to be somewhat improved from earlier this week   I  Eyes-pupils appear equal round reactive light --pu[pils appear reactive to light t   Oropharynx is clear --mucous membranes moist tongue is midline  Chest --very small amount of expiratory wheezing diffuse with somewhat shallow air entry- no labored breathing.  Heart is regular rate and rhythm she has I would say --2--+ edema with venous stasis changes of her legs bilaterally - I did not really see increased edema of the left versus the right there is no increased tenderness to palpation of either leg compared to baseline patient is very sensitive to any touching of her legs but this is not new I do not see increased warmth or erythema she does have venous stasis changes  .  Abdomen obese soft nontender with positive bowel sounds==.  Muscle skeletal moves all extremities x4 at baseline does ambulate in a wheelchair I do not note any deformities.  Neurologic-appears grossly intact I do not see a focal weaknesses or lateralizing findings strength appears to be intact on 4 extremities cranial nerves intact her speech is clear- t  Psych  is oriented to self is conversant and cognizant although does have some confusion at times-- is somewhat anxious.---  .  Labs  05/22/2015.  Sodium 143 potassium 3.9 BUN 44 creatinine 1.57.    05/19/2015.  Sodium 141 potassium 5.2 BUN 50 creatinine 1.6.  ALT 13 alkaline phosphatase 1:30 otherwise liver function tests within normal limits.  WBC 5.6 hemoglobin 12.5 platelets 213    04/10/2015.  TSH-1.454.  03/23/2015.  Sodium 142 potassium 4  BUN 44 creatinine 1.35 CO2 26.  02/18/2015.  BNP 15.0.  Liver function tests within normal limits except AST 14-ALT 12.  Cholesterol 232 triglyceride 71 HDL 112 LDL 106.  WBC 4.8 hemoglobin 12.3 platelets 174  03/02/2015.  Sodium 143 potassium 4 BUN 38 creatinine 1.27 CO2 30.  02/18/2015.  BNP 15.0.  Sodium 142 potassium 4.3 BUN 38 creatinine 1.45.  AST 14-ALT 12-albumin 4.0.  Cholesterol 232 triglyceride 71 HDL 112 LDL 106.  WBC 4.8 hemoglobin 12.3 platelets 174  02/05/2015.  Sodium 143 potassium 3.8 BUN 29 creatinine 1.19 CO2 29.  12/04/2014.  WBC 4.4 hemoglobin 11.8 platelets 197  11/11/2014.  Sodium 144 potassium 3.8 BUN 31 creatinine 1.09.  10/18/2014.  BNP 17.  WBC 5.1 hemoglobin 11.3 platelets 178    10/03/2014.  Sodium 142 potassium 4.2 BUN 46 creatinine 1.21  08/21/2014.  WBC 5.0 hemoglobin 12.2 platelets 2:15.  Sodium 140 potassium 4.2 BUN 28 creatinine 1.16-liver function tests within normal limits.  Cholesterol 229-triglycerides 97-HDL 97-LDL 113  05/28/2014.  WBC 4.6 hemoglobin 12.8 platelets 195.  Sodium 141 potassium 4.2 BUN 43 creatinine 1.23.  Liver function tests within normal limits except alkaline phosphatase 131    03/31/2014.  Sodium 143 potassium 4.3 BUN 32 creatinine 1.27.  03/07/2014.  WBC 5.2 hemoglobin 11.6 platelets 193.    02/12/2014.  Sodium 145 potassium 4.4 BUN 45 creatinine 1.32  01/22/2014.  Cholesterol 240 triglycerides 100 HDL 112 LDL 108.  WBC 4.5 hemoglobin 12.2 platelets 178.  Sodium 142 potassium 4.3 BUN 36 creatinine 1.18.  Liver function tests within normal limits except alkaline phosphatase minimally elevated at 138.  01/08/2014.  Sodium 145 potassium 4.3 BUN 39 creatinine 1.39.  12/23/2013.  Sodium 142 potassium 4.3 BUN 35 creatinine 1.16.  12/18/2013.  WBC 5.0 hemoglobin 11.3 platelets 151.  Sodium 144 potassium 3.8 BUN 29 creatinine 1.08.  Liver function tests within normal  limits except alkaline phosphatase minimally elevated at 120 bilirubin 0.2 albumin 3.1.  Val acid-40.3.  09/18/2013.  Sodium 142 potassium 4.0 BUN 28 creatinine 1.25.  Liver function tests within normal limits except alkaline phosphatase of 146.  WBC 4.6 hemoglobin 13.6 platelets 173.  Depakote level 54.3.  06/10/2013.  Sodium 142 potassium 4.2 BUN 40 creatinine 1.2 .  05/23/2013.  Sodium 141 potassium 4.2 BUN 36 creatinine 1.33.  Marland Kitchen       Assessment and plan  .        #1 CHF--edema---   This actually appears to be improved from earlier this week she did get an extra dose Lasix-again this is a challenging issue with her renal insufficiency-at this point since edema appears improving clinically appears stable will keep Lasix at 40 mg a day and monitor her weights closely-recheck her weight tomorrow notify Janicia Monterrosa of any weight gain-also update a basic metabolic panel on Monday, September 26.  I note her potassium has dropped fairly significantly since it was discontinued since she is on a significant dose of Lasix will start her potassium at a lower dose than previous will start her at 10 mEq a day and again update a BMP on Monday.  Addendum.  I have received the results of the venous Doppler which was obtained this afternoonstates minimal amount of age-indeterminate though potentially acute-nonocclusive DVT in the distal aspect of the left femoral vein-again she does not appear to be having any acute tenderness or increased erythema here her edema actually appears to be improved. I note she has a history of aspirin allergy -- did discuss this with her daughter via phone-I also reviewed previous medical notes including  by her primary care Saory Carriero-that states apparently that she's been on Plavix for an extended period of time-apparently tolerated this well-per her daughter she thinks possibly any allergy to aspirin is actually more of a GI intolerance issue  Will start her on  Xarelto total 15 mg twice a day for 21 days and then decrease to 20 mg daily-also will hold her Plavix for now while she is on Xarelto--update CBC next laboratory day as well  Clinically she appears stable vital signs stable does not complain of any increased shortness of breath beyond baseline.  [id number] note greater than 40 minutes spent assessing patient-discussing her status with nursing staff-reviewing her chart-and coordinating and formulating a plan of care-of note greater than 50% of time spent coordinating plan of care with new diagnosis of DVT--discussion with daughter about  allergy issues      [phone].

## 2015-05-25 ENCOUNTER — Encounter (HOSPITAL_COMMUNITY)
Admission: RE | Admit: 2015-05-25 | Discharge: 2015-05-25 | Disposition: A | Discharge status: Home / Self Care | Payer: Medicare Other | Source: Skilled Nursing Facility | Attending: Internal Medicine | Admitting: Internal Medicine

## 2015-05-25 DIAGNOSIS — Z79899 Other long term (current) drug therapy: Secondary | ICD-10-CM | POA: Diagnosis not present

## 2015-05-25 LAB — BASIC METABOLIC PANEL
ANION GAP: 7 (ref 5–15)
BUN: 42 mg/dL — ABNORMAL HIGH (ref 6–20)
CALCIUM: 8.8 mg/dL — AB (ref 8.9–10.3)
CO2: 28 mmol/L (ref 22–32)
CREATININE: 1.42 mg/dL — AB (ref 0.44–1.00)
Chloride: 109 mmol/L (ref 101–111)
GFR, EST AFRICAN AMERICAN: 36 mL/min — AB (ref >60)
GFR, EST NON AFRICAN AMERICAN: 31 mL/min — AB (ref >60)
Glucose, Bld: 109 mg/dL — ABNORMAL HIGH (ref 65–99)
Potassium: 4 mmol/L (ref 3.5–5.1)
SODIUM: 144 mmol/L (ref 135–145)

## 2015-05-25 LAB — CBC
HEMATOCRIT: 38.2 % (ref 36.0–46.0)
Hemoglobin: 12.2 g/dL (ref 12.0–15.0)
MCH: 28.9 pg (ref 26.0–34.0)
MCHC: 31.9 g/dL (ref 30.0–36.0)
MCV: 90.5 fL (ref 78.0–100.0)
Platelets: 194 10*3/uL (ref 150–400)
RBC: 4.22 MIL/uL (ref 3.87–5.11)
RDW: 14.9 % (ref 11.5–15.5)
WBC: 5.4 10*3/uL (ref 4.0–10.5)

## 2015-05-29 ENCOUNTER — Non-Acute Institutional Stay (SKILLED_NURSING_FACILITY): Payer: Medicare Other | Admitting: Internal Medicine

## 2015-05-29 ENCOUNTER — Encounter: Payer: Self-pay | Admitting: Internal Medicine

## 2015-05-29 DIAGNOSIS — Z7901 Long term (current) use of anticoagulants: Secondary | ICD-10-CM

## 2015-05-29 DIAGNOSIS — I82402 Acute embolism and thrombosis of unspecified deep veins of left lower extremity: Secondary | ICD-10-CM | POA: Diagnosis not present

## 2015-05-29 DIAGNOSIS — N289 Disorder of kidney and ureter, unspecified: Secondary | ICD-10-CM

## 2015-05-29 NOTE — Progress Notes (Signed)
Patient ID: [name], female   DOB: 1921/09/24, 81 y.o.   MRN: [id number]         Acute visit.  LOC-skilled.  Facility [facility]   Chief complaint- Acute visit follow-up anticoagulation issues--history of right leg DVT    History of present illness  Patient is a pleasant 81 year old female was been a long-term resident of this facility-I saw her recently for some increased edema which appears to have stabilized-also nursing staff noted a left leg they felt was possibly some slightly more increased edema last week a Doppler was ordered which actually came back showing a possibly acute nonocclusive small DVT in the distal aspect of the left femoral vein-she has been started onXarelto 15 mg twice a day ofor 21 days to be reduced to 20 mg a day after 3 weeks.  Clinically her edema appears to be stable pharmacy has left a note saying there is an interaction between the Xarelto and the Diltiazem---she had been on for blood pressure however the Diltiazem. Was discontinued some time ago.  Apparently the interaction could increase the effectiveness of the Xarelto in possibly lead to some increased bleeding risk.  Patient does have some history of renal insufficiency with a recent creatinine 1.42 BUN of 42 which is a relatively baseline-will have pharmacy to determine if this were all to no Sneads to be reduced as well secondary to her renal issues.  Clinically she appears to be tolerating this well to well no increased bruising or bleeding hemoglobin appears to be stable at 12.2 she has no complaints today appears to be feeling a bit better she is having a good day  . .  .        .  . Family medical social history has been reviewed per history and physical on 02/07/2011  .  Medications reviewed per [facility] Aricept 10 mg daily.  Calcium, 500 mg 3 times a day.  Celexa 10 mg daily.  Claritin 10 mg daily.  DuoNeb nebulizers every 6 hours when necessary.  Lasix 40 mg  daily.  Lotensin 40 mg daily.  Lovastatin 40 mg daily at bedtime.  Mylanta when necessary.  .  Norvasc 5 mg daily  . Plavix 75 mg daily.  Protonix 40 mg twice a day.  Restasis eyedrops twice a day.  Robitussin every 6 hours when necessary.  Simbrinza eyedrops 3 times a day.  Tessalon pearls 100 mg twice a day when necessary cough a statin eyedrops 1 drop each eye daily at bedtime.  Tylenol Extra Strength 500 mg 2 tabs twice a day. And once a day when necessary.  Ativan 0.25 mg every 6 hours when necessary anxiety    .  Review of systems.  In general denies any fever or chills. -What appears to be relatively stable in the lower 200s  . Skin does not complain of rashes or itching  Has venous stasis changes   .  Respiratory. has some intermittent shortness of breath this has not really new --does not complain of any increased shortness of breath from baseline   Cardiac----- denies any chest pain or palpitations.currently--chronic lower extremity edema - Again this appears stabilized does have a recent history of a what appears to be a minimal DVT on the left lower leg GI-complains of occasional abdominal discomfort-this is chronic again and has been evaluated by GI does not complaining of acute abdominal discomfort  -- -  Musculoskeletal-does have a history of back pain and osteoarthritis but this is controlled  apparently on Tylenol.  Neurologic-does not complaining of headache or dizziness. Or syncopal-type feelings  Psych-does have a history of anxiety depression and mood disorder--this has been relatively stable .  Marland Kitchen  Physical exam     She is afebrile pulse 80 respirations 18 blood pressure 126/71   .  In general this is a pleasant elderly female in no distress   Her skin is warm and dry. --Has chronic stasis changes of her lower legs bilaterally Edema appears to be somewhat improved from last week   I  Eyes-pupils appear equal round reactive light  --pu[pils appear reactive to light t   Oropharynx is clear --mucous membranes moist tongue is midline  Chest --very small amount of expiratory wheezing diffuse with somewhat shallow air entry- no labored breathing.  Heart is regular rate and rhythm she has I would say --2--+ edema with venous stasis changes of her legs bilaterally - I did not really see increased edema of the left versus the right there is no increased tenderness to palpation of either leg compared to baseline patient is very sensitive to any touching of her legs but this is not new I do not see increased warmth or erythema she does have venous stasis changes  .  Abdomen obese soft nontender with positive bowel sounds==.  Muscle skeletal moves all extremities x4 at baseline does ambulate in a wheelchair I do not note any deformities.     .  Labs  05/25/2015.  Sodium 144 potassium 4 BUN 42 creatinine 1.42.  WBC 5.4 hemoglobin 12.2 platelets 194  05/22/2015.  Sodium 143 potassium 3.9 BUN 44 creatinine 1.57.    05/19/2015.  Sodium 141 potassium 5.2 BUN 50 creatinine 1.6.  ALT 13 alkaline phosphatase 1:30 otherwise liver function tests within normal limits.  WBC 5.6 hemoglobin 12.5 platelets 213    04/10/2015.  TSH-1.454.  03/23/2015.  Sodium 142 potassium 4 BUN 44 creatinine 1.35 CO2 26.  02/18/2015.  BNP 15.0.  Liver function tests within normal limits except AST 14-ALT 12.  Cholesterol 232 triglyceride 71 HDL 112 LDL 106.  WBC 4.8 hemoglobin 12.3 platelets 174  03/02/2015.  Sodium 143 potassium 4 BUN 38 creatinine 1.27 CO2 30.  02/18/2015.  BNP 15.0.  Sodium 142 potassium 4.3 BUN 38 creatinine 1.45.  AST 14-ALT 12-albumin 4.0.  Cholesterol 232 triglyceride 71 HDL 112 LDL 106.  WBC 4.8 hemoglobin 12.3 platelets 174  02/05/2015.  Sodium 143 potassium 3.8 BUN 29 creatinine 1.19 CO2 29.  12/04/2014.  WBC 4.4 hemoglobin 11.8 platelets 197  11/11/2014.  Sodium 144 potassium 3.8  BUN 31 creatinine 1.09.  10/18/2014.  BNP 17.  WBC 5.1 hemoglobin 11.3 platelets 178    10/03/2014.  Sodium 142 potassium 4.2 BUN 46 creatinine 1.21  08/21/2014.  WBC 5.0 hemoglobin 12.2 platelets 2:15.  Sodium 140 potassium 4.2 BUN 28 creatinine 1.16-liver function tests within normal limits.  Cholesterol 229-triglycerides 97-HDL 97-LDL 113  05/28/2014.  WBC 4.6 hemoglobin 12.8 platelets 195.  Sodium 141 potassium 4.2 BUN 43 creatinine 1.23.  Liver function tests within normal limits except alkaline phosphatase 131    03/31/2014.  Sodium 143 potassium 4.3 BUN 32 creatinine 1.27.  03/07/2014.  WBC 5.2 hemoglobin 11.6 platelets 193.    02/12/2014.  Sodium 145 potassium 4.4 BUN 45 creatinine 1.32  01/22/2014.  Cholesterol 240 triglycerides 100 HDL 112 LDL 108.  WBC 4.5 hemoglobin 12.2 platelets 178.  Sodium 142 potassium 4.3 BUN 36 creatinine 1.18.  Liver function tests within normal limits except alkaline  phosphatase minimally elevated at 138.  01/08/2014.  Sodium 145 potassium 4.3 BUN 39 creatinine 1.39.  12/23/2013.  Sodium 142 potassium 4.3 BUN 35 creatinine 1.16.  12/18/2013.  WBC 5.0 hemoglobin 11.3 platelets 151.  Sodium 144 potassium 3.8 BUN 29 creatinine 1.08.  Liver function tests within normal limits except alkaline phosphatase minimally elevated at 120 bilirubin 0.2 albumin 3.1.  Val acid-40.3.  09/18/2013.  Sodium 142 potassium 4.0 BUN 28 creatinine 1.25.  Liver function tests within normal limits except alkaline phosphatase of 146.  WBC 4.6 hemoglobin 13.6 platelets 173.  Depakote level 54.3.  06/10/2013.  Sodium 142 potassium 4.2 BUN 40 creatinine 1.2 .  05/23/2013.  Sodium 141 potassium 4.2 BUN 36 creatinine 1.33.  Marland Kitchen       Assessment and plan   #1-history of small left lower leg DVT-she is on Xarelto for anticoagulation will have pharmacy assess this to see if dose needs to be reduced secondary to her renal insufficiency-there's  been no increased bruising or  apparent bleeding her hemoglobin is stable will update a CBC on Monday, October 3.  #2  edema this appears stable on Lasix 40 mg a day at this point monitor electrolytes-- will update a BMP on Monday as well.  [id number] .         -              .

## 2015-06-01 ENCOUNTER — Encounter (HOSPITAL_COMMUNITY)
Admission: RE | Admit: 2015-06-01 | Discharge: 2015-06-01 | Disposition: A | Discharge status: Home / Self Care | Payer: Medicare Other | Source: Skilled Nursing Facility | Attending: Internal Medicine | Admitting: Internal Medicine

## 2015-06-01 DIAGNOSIS — N189 Chronic kidney disease, unspecified: Secondary | ICD-10-CM | POA: Diagnosis not present

## 2015-06-01 DIAGNOSIS — R609 Edema, unspecified: Secondary | ICD-10-CM | POA: Insufficient documentation

## 2015-06-01 DIAGNOSIS — I509 Heart failure, unspecified: Secondary | ICD-10-CM | POA: Diagnosis not present

## 2015-06-01 LAB — BASIC METABOLIC PANEL
Anion gap: 6 (ref 5–15)
BUN: 46 mg/dL — AB (ref 6–20)
CALCIUM: 8.5 mg/dL — AB (ref 8.9–10.3)
CO2: 27 mmol/L (ref 22–32)
CREATININE: 1.42 mg/dL — AB (ref 0.44–1.00)
Chloride: 106 mmol/L (ref 101–111)
GFR calc non Af Amer: 31 mL/min — ABNORMAL LOW (ref >60)
GFR, EST AFRICAN AMERICAN: 36 mL/min — AB (ref >60)
Glucose, Bld: 102 mg/dL — ABNORMAL HIGH (ref 65–99)
Potassium: 4.1 mmol/L (ref 3.5–5.1)
SODIUM: 139 mmol/L (ref 135–145)

## 2015-06-01 LAB — CBC
HCT: 39.8 % (ref 36.0–46.0)
Hemoglobin: 12.7 g/dL (ref 12.0–15.0)
MCH: 29 pg (ref 26.0–34.0)
MCHC: 31.9 g/dL (ref 30.0–36.0)
MCV: 90.9 fL (ref 78.0–100.0)
PLATELETS: 199 10*3/uL (ref 150–400)
RBC: 4.38 MIL/uL (ref 3.87–5.11)
RDW: 14.7 % (ref 11.5–15.5)
WBC: 4.9 10*3/uL (ref 4.0–10.5)

## 2015-06-08 DIAGNOSIS — H401134 Primary open-angle glaucoma, bilateral, indeterminate stage: Secondary | ICD-10-CM | POA: Diagnosis not present

## 2015-06-08 DIAGNOSIS — Z7901 Long term (current) use of anticoagulants: Secondary | ICD-10-CM | POA: Diagnosis not present

## 2015-06-08 DIAGNOSIS — H04123 Dry eye syndrome of bilateral lacrimal glands: Secondary | ICD-10-CM | POA: Diagnosis not present

## 2015-06-08 DIAGNOSIS — Z961 Presence of intraocular lens: Secondary | ICD-10-CM | POA: Diagnosis not present

## 2015-06-17 ENCOUNTER — Encounter: Payer: Self-pay | Admitting: Internal Medicine

## 2015-06-17 ENCOUNTER — Non-Acute Institutional Stay (SKILLED_NURSING_FACILITY): Payer: Medicare Other | Admitting: Internal Medicine

## 2015-06-17 DIAGNOSIS — K219 Gastro-esophageal reflux disease without esophagitis: Secondary | ICD-10-CM

## 2015-06-17 DIAGNOSIS — R609 Edema, unspecified: Secondary | ICD-10-CM

## 2015-06-17 DIAGNOSIS — N289 Disorder of kidney and ureter, unspecified: Secondary | ICD-10-CM

## 2015-06-17 DIAGNOSIS — F03918 Unspecified dementia, unspecified severity, with other behavioral disturbance: Secondary | ICD-10-CM

## 2015-06-17 DIAGNOSIS — I82402 Acute embolism and thrombosis of unspecified deep veins of left lower extremity: Secondary | ICD-10-CM

## 2015-06-17 DIAGNOSIS — F0391 Unspecified dementia with behavioral disturbance: Secondary | ICD-10-CM

## 2015-06-17 NOTE — Progress Notes (Signed)
Patient ID: [name], female   DOB: 1922-04-26, 81 y.o.   MRN: [id number]           Routine visit.  LOC-skilled.  Facility [facility]   Chief complaint-medical management of dementia renal insufficiency dysphagia hyperlipidemia depression- GERD--history left leg DVT     History of present illness .  Patient is a pleasant elderly resident with the above diagnoses .   She has been relatively stable she has chronic lower extremity edema and fact last month was Doppler did show a minimal nonocclusive possibly acute DVT in the left distal femoral vein-she has been started on Xarelto-pharmacy is left a note saying with her renal function Eliquis would be a better alternative and I suspect we will switch her to this.  The edema today appears to be relatively baseline slightly more on the left versus the right She does have a history of diastolic CHF however BNPs in the past have consistently been unremarkable    She does have a history of renal insufficiency creatinine most recently 1.42 BUN of 46 is relatively baseline we will update this.  Her weight appears to fluctuate most recently 206.2 this appears to be on the higher end of her baseline    Her vital signs appear to be stable  She has a history of hypertension but blood pressures appear stable most recently 122/65-130/71-129/69-she is on Norvasc 5 mg a day as well as Lotensin 40 mg a day     It a  She does have a history of GERD is now on protonicx twice a day---.   staes she has trouble swallowing at times----- in the past-she has had a GI consult-they ordered a CT scan of the abdomen which did not show any acute process and  showed colonic diverticulosis without evidence of acute diverticulitis-also a fibroid uterus In 2012 she did have an EGD which basically showed age-related esophageal motility decline but no acute issue Per nursing staff she is not complaining of dysphagia recently     Regards to her  other issues--  she has a history of depressive symptoms-she is on Celexa.--Also was on Depakote as a mood stabilizer which appears to help.  .      .  . Family medical social history has been reviewed per history and physical on 02/07/2011  .  Medications reviewed per [facility] Aricept 10 mg daily.  Calcium, 500 mg 3 times a day.  Celexa 10 mg daily.  Claritin 10 mg daily.  DuoNeb nebulizers every 6 hours when necessary.  Lasix 40 mg daily.  Lotensin 40 mg daily.  Lovastatin 40 mg daily at bedtime.  Mylanta when necessary.  .  Norvasc 5 mg daily  . Xarelto  20 mg daily again we will switch this to Eliquistt 5 mg twice a day  Potassium 10 mEq daily.  Protonix 40 mg twice a day.  Restasis eyedrops twice a day.  Robitussin every 6 hours when necessary.  Simbrinza eyedrops 3 times a day.  Tessalon pearls 100 mg twice a day when necessary cough a statin eyedrops 1 drop each eye daily at bedtime.  Tylenol Extra Strength 500 mg 2 tabs twice a day. And once a day when necessary.  Ativan 0.25 mg every 6 hours when necessary anxiety    .  Review of systems.  In general denies any fever or chills. -Weight appears to be relatively stable-  . Skin does not complain of rashes or itching  Has venous stasis changes  Head ears eyes nose mouth and throat  Had some history of allergic rhinitis -does not complain of any visual changes  .  Respiratory. has some intermittent shortness of breath this has not really new  Cardiac----- denies any chest pain or palpitations.currently--chronic lower extremity edema  GI-complains of occasional abdominal discomfort-this is chronic again and has been evaluated by GI does not complaining of acute abdominal discomfort  -- -  Musculoskeletal-does have a history of back pain and osteoarthritis but this is controlled apparently on Tylenol.  Neurologic-does not complaining of headache or dizziness. Or syncopal-type feelings  Psych-does have  a history of anxiety depression and mood disorder--this has been relatively stable .  Marland Kitchen  Physical exam    She is afebrile pulse 64 respirations 20 blood pressure 122/65 weight is 206.2     .  In general this is a pleasant elderly female in no distress   Her skin is warm and dry. --Has chronic stasis changes of her lower legs bilaterally Proximally 2+ bilaterally slightly more on the left versus the right they are not more tender and more erythematous than baseline  I  Eyes-pupils appear equal round reactive light --pu[pils appear reactive to light t   Oropharynx is clear --mucous membranes moist tongue is midline  Chest --clear to auscultation with somewhat shallow air entry- no labored breathing.  Heart is regular rate and rhythm she has I would say --2--+ edema with venous stasis changes of her legs bilaterally - .  Abdomen obese soft nontender with positive bowel sounds==.  Muscle skeletal moves all extremities x4 at baseline does ambulate in a wheelchair I do not note any deformities.  Neurologic-appears grossly intact I do not see a focal weaknesses or lateralizing findings strength appears to be intact on 4 extremities cranial nerves intact her speech is clear--touch sensation of her lower extremities and feet appears to be intact  Psych  is oriented to self is conversant and cognizant although does have some confusion at times-- is somewhat anxious.---  .  Labs  06/01/2015.  Sodium 139 potassium 4.1 BUN 46 creatinine 1.42.  WBC 4.9 hemoglobin 12.7 platelets 199.    04/10/2015.  TSH-1.454.  03/23/2015.  Sodium 142 potassium 4 BUN 44 creatinine 1.35 CO2 26.  02/18/2015.  BNP 15.0.  Liver function tests within normal limits except AST 14-ALT 12.  Cholesterol 232 triglyceride 71 HDL 112 LDL 106.  WBC 4.8 hemoglobin 12.3 platelets 174  03/02/2015.  Sodium 143 potassium 4 BUN 38 creatinine 1.27 CO2 30.  02/18/2015.  BNP 15.0.  Sodium 142 potassium 4.3  BUN 38 creatinine 1.45.  AST 14-ALT 12-albumin 4.0.  Cholesterol 232 triglyceride 71 HDL 112 LDL 106.  WBC 4.8 hemoglobin 12.3 platelets 174  02/05/2015.  Sodium 143 potassium 3.8 BUN 29 creatinine 1.19 CO2 29.  12/04/2014.  WBC 4.4 hemoglobin 11.8 platelets 197  11/11/2014.  Sodium 144 potassium 3.8 BUN 31 creatinine 1.09.  10/18/2014.  BNP 17.  WBC 5.1 hemoglobin 11.3 platelets 178    10/03/2014.  Sodium 142 potassium 4.2 BUN 46 creatinine 1.21  08/21/2014.  WBC 5.0 hemoglobin 12.2 platelets 2:15.  Sodium 140 potassium 4.2 BUN 28 creatinine 1.16-liver function tests within normal limits.  Cholesterol 229-triglycerides 97-HDL 97-LDL 113  05/28/2014.  WBC 4.6 hemoglobin 12.8 platelets 195.  Sodium 141 potassium 4.2 BUN 43 creatinine 1.23.  Liver function tests within normal limits except alkaline phosphatase 131    03/31/2014.  Sodium 143 potassium 4.3 BUN 32 creatinine 1.27.  03/07/2014.  WBC 5.2 hemoglobin 11.6 platelets 193.    02/12/2014.  Sodium 145 potassium 4.4 BUN 45 creatinine 1.32  01/22/2014.  Cholesterol 240 triglycerides 100 HDL 112 LDL 108.  WBC 4.5 hemoglobin 12.2 platelets 178.  Sodium 142 potassium 4.3 BUN 36 creatinine 1.18.  Liver function tests within normal limits except alkaline phosphatase minimally elevated at 138.  01/08/2014.  Sodium 145 potassium 4.3 BUN 39 creatinine 1.39.  12/23/2013.  Sodium 142 potassium 4.3 BUN 35 creatinine 1.16.  12/18/2013.  WBC 5.0 hemoglobin 11.3 platelets 151.  Sodium 144 potassium 3.8 BUN 29 creatinine 1.08.  Liver function tests within normal limits except alkaline phosphatase minimally elevated at 120 bilirubin 0.2 albumin 3.1.  Val acid-40.3.  09/18/2013.  Sodium 142 potassium 4.0 BUN 28 creatinine 1.25.  Liver function tests within normal limits except alkaline phosphatase of 146.  WBC 4.6 hemoglobin 13.6 platelets 173.  Depakote level 54.3.  06/10/2013.  Sodium 142 potassium  4.2 BUN 40 creatinine 1.2 .  05/23/2013.  Sodium 141 potassium 4.2 BUN 36 creatinine 1.33.  Marland Kitchen       Assessment and plan  .  Number 1--HTN--- currently on Lotensin and Norvasc - appears to have stabilized recently pressures 122/65-130/71-127/69   ...  -  x  #2-dementia-this appears to be relatively mild she continues on Aricept with Depakote as a mood stabilizer.-     #3 CHF--edema---  Currently on Lasix 40-milligrams a day- As well as potassium 10 mEq day-weight appears to be relatively stable clinically appears to be stable her BNP continues to be quite unremarkable-she does have a history of renal insufficiency      .    #4-dysphagia-GERD--continues on Protonix twice a day-this has been followed by GI-CT scan of the abdomen again appear to be relatively benign-  She is not really complaining of dysphasia today-although she will complain of this intermittently .  #5 renal insufficiency-this appears to be relatively stable we'll update Metabolic panel.   #6 hyperlipidemia-t On Mevacor-- t--of note she has consistently high HDLs--and mildly elevated alkaline phosphatase-recent liver function tests however were within normal limits --r HDL is quite high at 112--LDL 106-with patient's advanced age will not be real aggressive with this-- L .  #7-depression this appears stable on Celexa--she does have periods of anxiety but this appears to be relatively well controlled .  #8-anemia- suspect there is an element of chronic disease here -this has been stable to slightly improved with a hemoglobin of 12.7--on 06/01/2015 .  #9-history of allergic rhinitis this has been asymptomatic for a while.--Claritin appears to be effective she is also on Flonase   10-gout-this is been asymptomatic for a while continue to monitor--she continues to have a sensitivity when her lower extremities are palpated but this does not appear to be any change from her baseline  11 history of left leg DVT  as noted above we will switch her to a Eliquis 5 mg twice a day secondary to her impaired renal function this is per consult with pharmacy  [id number]--of note greater than 35 minutes spent assessing patient-discussing her concerns at bedside-reviewing her chart and discussing her status with nursing staff -and coordinating and formulating a plan of care-no greater than 50% of time spent coordinating plan of care

## 2015-06-18 ENCOUNTER — Encounter (HOSPITAL_COMMUNITY)
Admission: AD | Admit: 2015-06-18 | Discharge: 2015-06-18 | Disposition: A | Discharge status: Home / Self Care | Payer: Medicare Other | Source: Skilled Nursing Facility | Attending: Internal Medicine | Admitting: Internal Medicine

## 2015-06-18 DIAGNOSIS — R609 Edema, unspecified: Secondary | ICD-10-CM | POA: Diagnosis not present

## 2015-06-18 DIAGNOSIS — N189 Chronic kidney disease, unspecified: Secondary | ICD-10-CM | POA: Diagnosis not present

## 2015-06-18 DIAGNOSIS — I509 Heart failure, unspecified: Secondary | ICD-10-CM | POA: Diagnosis not present

## 2015-06-18 LAB — BASIC METABOLIC PANEL
Anion gap: 8 (ref 5–15)
BUN: 45 mg/dL — AB (ref 6–20)
CALCIUM: 8.9 mg/dL (ref 8.9–10.3)
CO2: 28 mmol/L (ref 22–32)
Chloride: 107 mmol/L (ref 101–111)
Creatinine, Ser: 1.55 mg/dL — ABNORMAL HIGH (ref 0.44–1.00)
GFR calc Af Amer: 33 mL/min — ABNORMAL LOW (ref >60)
GFR, EST NON AFRICAN AMERICAN: 28 mL/min — AB (ref >60)
GLUCOSE: 94 mg/dL (ref 65–99)
Potassium: 3.9 mmol/L (ref 3.5–5.1)
SODIUM: 143 mmol/L (ref 135–145)

## 2015-06-18 LAB — CBC WITH DIFFERENTIAL/PLATELET
BASOS ABS: 0 10*3/uL (ref 0.0–0.1)
BASOS PCT: 0 %
EOS ABS: 0.1 10*3/uL (ref 0.0–0.7)
EOS PCT: 3 %
HCT: 38.2 % (ref 36.0–46.0)
Hemoglobin: 12.4 g/dL (ref 12.0–15.0)
Lymphocytes Relative: 35 %
Lymphs Abs: 1.7 10*3/uL (ref 0.7–4.0)
MCH: 29.5 pg (ref 26.0–34.0)
MCHC: 32.5 g/dL (ref 30.0–36.0)
MCV: 90.7 fL (ref 78.0–100.0)
MONO ABS: 0.6 10*3/uL (ref 0.1–1.0)
Monocytes Relative: 11 %
Neutro Abs: 2.5 10*3/uL (ref 1.7–7.7)
Neutrophils Relative %: 51 %
PLATELETS: 188 10*3/uL (ref 150–400)
RBC: 4.21 MIL/uL (ref 3.87–5.11)
RDW: 14.6 % (ref 11.5–15.5)
WBC: 5 10*3/uL (ref 4.0–10.5)

## 2015-06-22 ENCOUNTER — Encounter (HOSPITAL_COMMUNITY)
Admission: RE | Admit: 2015-06-22 | Discharge: 2015-06-22 | Disposition: A | Discharge status: Home / Self Care | Payer: Medicare Other | Source: Skilled Nursing Facility | Attending: Internal Medicine | Admitting: Internal Medicine

## 2015-06-25 DIAGNOSIS — F0390 Unspecified dementia without behavioral disturbance: Secondary | ICD-10-CM | POA: Diagnosis not present

## 2015-06-25 DIAGNOSIS — F411 Generalized anxiety disorder: Secondary | ICD-10-CM | POA: Diagnosis not present

## 2015-06-25 DIAGNOSIS — F329 Major depressive disorder, single episode, unspecified: Secondary | ICD-10-CM | POA: Diagnosis not present

## 2015-07-13 DIAGNOSIS — I739 Peripheral vascular disease, unspecified: Secondary | ICD-10-CM | POA: Diagnosis not present

## 2015-07-13 DIAGNOSIS — M79672 Pain in left foot: Secondary | ICD-10-CM | POA: Diagnosis not present

## 2015-07-13 DIAGNOSIS — M79671 Pain in right foot: Secondary | ICD-10-CM | POA: Diagnosis not present

## 2015-07-13 DIAGNOSIS — B351 Tinea unguium: Secondary | ICD-10-CM | POA: Diagnosis not present

## 2015-07-14 ENCOUNTER — Non-Acute Institutional Stay (SKILLED_NURSING_FACILITY): Payer: Medicare Other | Admitting: Internal Medicine

## 2015-07-14 DIAGNOSIS — N289 Disorder of kidney and ureter, unspecified: Secondary | ICD-10-CM

## 2015-07-14 DIAGNOSIS — I10 Essential (primary) hypertension: Secondary | ICD-10-CM

## 2015-07-14 DIAGNOSIS — F411 Generalized anxiety disorder: Secondary | ICD-10-CM | POA: Diagnosis not present

## 2015-07-14 DIAGNOSIS — I82409 Acute embolism and thrombosis of unspecified deep veins of unspecified lower extremity: Secondary | ICD-10-CM

## 2015-07-14 DIAGNOSIS — F0390 Unspecified dementia without behavioral disturbance: Secondary | ICD-10-CM | POA: Diagnosis not present

## 2015-07-14 DIAGNOSIS — R609 Edema, unspecified: Secondary | ICD-10-CM

## 2015-07-14 DIAGNOSIS — F0391 Unspecified dementia with behavioral disturbance: Secondary | ICD-10-CM | POA: Diagnosis not present

## 2015-07-14 DIAGNOSIS — K219 Gastro-esophageal reflux disease without esophagitis: Secondary | ICD-10-CM | POA: Diagnosis not present

## 2015-07-14 DIAGNOSIS — F03918 Unspecified dementia, unspecified severity, with other behavioral disturbance: Secondary | ICD-10-CM

## 2015-07-14 DIAGNOSIS — F329 Major depressive disorder, single episode, unspecified: Secondary | ICD-10-CM | POA: Diagnosis not present

## 2015-07-14 NOTE — Progress Notes (Signed)
Patient ID: [name], female   DOB: 1921-10-18, 81 y.o.   MRN: [id number]            Routine visit.  LOC-skilled.  Facility [facility]   Chief complaint-medical management of dementia renal insufficiency dysphagia hyperlipidemia depression- GERD--history left leg DVT Acute visit secondary to question increased edema-weight gain     History of present illness .  Patient is a pleasant elderly resident with the above diagnoses .   She has been relatively stable she has chronic lower extremity edema and fact in September  Doppler did show a minimal nonocclusive possibly acute DVT in the left distal femoral vein-she has been started on  Eliquis and appears to be tolerating this unremarkably.  She has a history of chronic lower extremity edema venous stasis changes-nursing staff has noted possibly some mild weight gain of about 3 pounds over the past week or 2-possibly some slightly increased edema although this does not appear to be grossly changed from previous exams ---She does have a history of diastolic CHF however BNPs in the past have consistently been unremarkable    She does have a history of renal insufficiency creatinine most recently 1.55 BUN of 45 is relatively baseline we will update this.  Her weight appears to fluctuate most recently 208 this appears to be about 3 pounds more than her baseline although there is fluctuation here     Her vital signs appear to be stable  although at times her blood pressure does elevate recent readings ranging from 178/59-down to 137/61-I got 120/60 manually today She has a history of hypertension -she is on Norvasc 5 mg a day as well as Lotensin 40 mg a day--I suspect some of the blood pressure variation is due to the size of the cuff used I did use a larger Cluff and got the lower readings she does have rather large arms     It a  She does have a history of GERD is now on protonicx twice a day---.   staes she has trouble  swallowing at times----- in the past-she has had a GI consult-they ordered a CT scan of the abdomen which did not show any acute process and  showed colonic diverticulosis without evidence of acute diverticulitis-also a fibroid uterus In 2012 she did have an EGD which basically showed age-related esophageal motility decline but no acute issue Per nursing staff she is not complaining of dysphagia recently     Regards to her other issues--  she has a history of depressive symptoms-she is on Celexa.--Also was on Depakote as a mood stabilizer which appears to help.  .      .  . Family medical social history has been reviewed per history and physical on 02/07/2011  .  Medications reviewed per [facility] Aricept 10 mg daily.  Calcium, 500 mg 3 times a day.  Celexa 10 mg daily.  Claritin 10 mg daily.  DuoNeb nebulizers every 6 hours when necessary.  Lasix 40 mg daily.  Lotensin 40 mg daily.  Lovastatin 40 mg daily at bedtime.  Mylanta when necessary.  .  Norvasc 5 mg daily  . o Eliquistt 5 mg twice a day  Potassium 10 mEq daily.  Protonix 40 mg twice a day.  Restasis eyedrops twice a day.  Robitussin every 6 hours when necessary.  Simbrinza eyedrops 3 times a day.  Tessalon pearls 100 mg twice a day when necessary cough a statin eyedrops 1 drop each eye daily at bedtime.  Tylenol Extra Strength 500 mg 2 tabs twice a day. And once a day when necessary.  Ativan 0.25 mg every 6 hours when necessary anxiety    .  Review of systems.  In general denies any fever or chills. -Weight appears to be slightly trending up . Skin does not complain of rashes or itching  Has venous stasis changes Head ears eyes nose mouth and throat  Had some history of allergic rhinitis -does not complain of any visual changes  .  Respiratory. has some intermittent shortness of breath this has not really new  Cardiac----- denies any chest pain or palpitations.currently--chronic lower extremity  edema  GI-complains of occasional abdominal discomfort-this is chronic again and has been evaluated by GI does not complaining of acute abdominal discomfort  -- -  Musculoskeletal-does have a history of back pain and osteoarthritis but this is controlled apparently on Tylenol.  Neurologic-does not complaining of headache or dizziness. Or syncopal-type feelings  Psych-does have a history of anxiety depression and mood disorder--this has been relatively stable--initially was started day she was bit anxious with this appeared had resolved when I reevaluated her .  Marland Kitchen  Physical exam    T-98.0 pulse 66 respirations 20 blood pressure taken manually today 120/60 this appeared to be comparable in both arms her weight is 208     .  In general this is a pleasant elderly female in no distress     Her skin is warm and dry. --Has chronic stasis changes of her lower legs bilaterally Approximately 2-3+ bilaterally--this may be slightly increased compared to last exam   I  Eyes-pupils appear equal round reactive light --pu[pils appear reactive to light t   Oropharynx is clear --mucous membranes moist tongue is midline  Chest --slight expiratory wheeze at bases-- somewhat shallow air entry- no labored breathing.  Heart is regular rate and rhythm she has I would say --2--3-+ edema with venous stasis changes of her legs bilaterally - .  Abdomen obese soft nontender with positive bowel sounds==.  Muscle skeletal moves all extremities x4 at baseline does ambulate in a wheelchair I do not note any deformities.  Neurologic-appears grossly intact I do not see a focal weaknesses or lateralizing findings strength appears to be intact on 4 extremities cranial nerves intact her speech is clear--touch sensation of her lower extremities and feet appears to be intact  Psych  is oriented to self is conversant and cognizant although does have some confusion at times-- is somewhat anxious.---  .   Labs   06/18/2015.  Sodium 143 potassium 3.9 BUN 45 creatinine 1.55.  WBC 5.0 hemoglobin 12.4 platelets 188  06/01/2015.  Sodium 139 potassium 4.1 BUN 46 creatinine 1.42.  WBC 4.9 hemoglobin 12.7 platelets 199.    04/10/2015.  TSH-1.454.  03/23/2015.  Sodium 142 potassium 4 BUN 44 creatinine 1.35 CO2 26.  02/18/2015.  BNP 15.0.  Liver function tests within normal limits except AST 14-ALT 12.  Cholesterol 232 triglyceride 71 HDL 112 LDL 106.  WBC 4.8 hemoglobin 12.3 platelets 174  03/02/2015.  Sodium 143 potassium 4 BUN 38 creatinine 1.27 CO2 30.  02/18/2015.  BNP 15.0.  Sodium 142 potassium 4.3 BUN 38 creatinine 1.45.  AST 14-ALT 12-albumin 4.0.  Cholesterol 232 triglyceride 71 HDL 112 LDL 106.  WBC 4.8 hemoglobin 12.3 platelets 174  02/05/2015.  Sodium 143 potassium 3.8 BUN 29 creatinine 1.19 CO2 29.  12/04/2014.  WBC 4.4 hemoglobin 11.8 platelets 197  11/11/2014.  Sodium 144 potassium 3.8 BUN  31 creatinine 1.09.  10/18/2014.  BNP 17.  WBC 5.1 hemoglobin 11.3 platelets 178    10/03/2014.  Sodium 142 potassium 4.2 BUN 46 creatinine 1.21  08/21/2014.  WBC 5.0 hemoglobin 12.2 platelets 2:15.  Sodium 140 potassium 4.2 BUN 28 creatinine 1.16-liver function tests within normal limits.  Cholesterol 229-triglycerides 97-HDL 97-LDL 113  05/28/2014.  WBC 4.6 hemoglobin 12.8 platelets 195.  Sodium 141 potassium 4.2 BUN 43 creatinine 1.23.  Liver function tests within normal limits except alkaline phosphatase 131    03/31/2014.  Sodium 143 potassium 4.3 BUN 32 creatinine 1.27.  03/07/2014.  WBC 5.2 hemoglobin 11.6 platelets 193.    02/12/2014.  Sodium 145 potassium 4.4 BUN 45 creatinine 1.32  01/22/2014.  Cholesterol 240 triglycerides 100 HDL 112 LDL 108.  WBC 4.5 hemoglobin 12.2 platelets 178.  Sodium 142 potassium 4.3 BUN 36 creatinine 1.18.  Liver function tests within normal limits except alkaline phosphatase  minimally elevated at 138.  01/08/2014.  Sodium 145 potassium 4.3 BUN 39 creatinine 1.39.  12/23/2013.  Sodium 142 potassium 4.3 BUN 35 creatinine 1.16.  12/18/2013.  WBC 5.0 hemoglobin 11.3 platelets 151.  Sodium 144 potassium 3.8 BUN 29 creatinine 1.08.  Liver function tests within normal limits except alkaline phosphatase minimally elevated at 120 bilirubin 0.2 albumin 3.1.  Val acid-40.3.  09/18/2013.  Sodium 142 potassium 4.0 BUN 28 creatinine 1.25.  Liver function tests within normal limits except alkaline phosphatase of 146.  WBC 4.6 hemoglobin 13.6 platelets 173.  Depakote level 54.3.  06/10/2013.  Sodium 142 potassium 4.2 BUN 40 creatinine 1.2 .  05/23/2013.  Sodium 141 potassium 4.2 BUN 36 creatinine 1.33.  Marland Kitchen       Assessment and plan  .  Number 1--HTN--- currently on Lotensin and Norvasc - quite a very bit of variability although I do not see consistent elevations I suspect some of the variability maybe be due to smaller cuff-- taken with a larger cuff today blood pressure was stable at 120/60   ...  -  x  #2-dementia-this appears to be relatively mild she continues on Aricept with Depakote as a mood stabilizer.-     #3 CHF--edema---  Currently on Lasix 40-milligrams a day- As well as potassium 10 mEq day-w Possibly slightly increased today-this is a challenging balancing situation with her history of renal insufficiency will increase her Lasix to 60 mg Monday Wednesday Friday continue 40 mg all other days check a metabolic panel and BNP tomorrow also check a metabolic panel next week.  She does have small amount of wheezing although this I suspect is not grossly changed will check a chest x-ray rule out pulmonary edema however     .    #4-dysphagia-GERD--continues on Protonix twice a day-this has been followed by GI-CT scan of the abdomen again appear to be relatively benign-  She is not really complaining of dysphasia today-although she will complain of  this intermittently .  #5 renal insufficiency-this appears to be relatively stable we'll update Metabolic panel.--And recheck this next week as well secondary to change in Lasix dose   #6 hyperlipidemia-t On Mevacor-- t--of note she has consistently high HDLs--and mildly elevated alkaline phosphatase-recent liver function tests however were within normal limits --r HDL is quite high at 112--LDL 106-with patient's advanced age will not be real aggressive with this-- L .  #7-depression this appears stable on Celexa--she does have periods of anxiety and she actually was seen by the psychiatric nurse practitioner today her Ativan dose was changed to  0.25 mg twice a day routine she previously had been on this routinely in the morning and then had when necessary doseslater in the day .  #8-anemia- suspect there is an element of chronic disease here -this has been stable to slightly improved with a hemoglobin of 12.4--on 06/18/15 .  #9-history of allergic rhinitis this has been asymptomatic for a while.--Claritin appears to be effective she is also on Flonase   10-gout-this is been asymptomatic for a while continue to monitor--she continues to have a sensitivity when her lower extremities are palpated but this does not appear to be any change from her baseline  11 history of left leg DVT  This appears to be stable and fairly minimal she is on Eliquistfor anticoagulation  [id number]--of note greater than 35 minutes spent assessing patient-discussing her concerns at bedside-reviewing her chart and discussing her status with nursing staff -and coordinating and formulating a plan of care-no greater than 50% of time spent coordinating plan of carefor numerous diagnoses

## 2015-07-15 ENCOUNTER — Ambulatory Visit (HOSPITAL_COMMUNITY)
Admission: RE | Admit: 2015-07-15 | Discharge: 2015-07-15 | Disposition: A | Discharge status: Home / Self Care | Payer: Medicare Other | Source: Ambulatory Visit | Attending: Internal Medicine | Admitting: Internal Medicine

## 2015-07-15 ENCOUNTER — Encounter (HOSPITAL_COMMUNITY)
Admission: AD | Admit: 2015-07-15 | Discharge: 2015-07-15 | Disposition: A | Discharge status: Home / Self Care | Payer: Medicare Other | Source: Skilled Nursing Facility | Attending: Internal Medicine | Admitting: Internal Medicine

## 2015-07-15 DIAGNOSIS — R062 Wheezing: Secondary | ICD-10-CM | POA: Insufficient documentation

## 2015-07-15 DIAGNOSIS — J309 Allergic rhinitis, unspecified: Secondary | ICD-10-CM | POA: Insufficient documentation

## 2015-07-15 DIAGNOSIS — J449 Chronic obstructive pulmonary disease, unspecified: Secondary | ICD-10-CM | POA: Diagnosis not present

## 2015-07-15 DIAGNOSIS — I10 Essential (primary) hypertension: Secondary | ICD-10-CM | POA: Insufficient documentation

## 2015-07-15 DIAGNOSIS — I509 Heart failure, unspecified: Secondary | ICD-10-CM | POA: Diagnosis not present

## 2015-07-15 LAB — BASIC METABOLIC PANEL
Anion gap: 10 (ref 5–15)
BUN: 36 mg/dL — AB (ref 6–20)
CALCIUM: 9.1 mg/dL (ref 8.9–10.3)
CO2: 26 mmol/L (ref 22–32)
CREATININE: 1.12 mg/dL — AB (ref 0.44–1.00)
Chloride: 106 mmol/L (ref 101–111)
GFR calc Af Amer: 48 mL/min — ABNORMAL LOW (ref >60)
GFR, EST NON AFRICAN AMERICAN: 42 mL/min — AB (ref >60)
GLUCOSE: 95 mg/dL (ref 65–99)
Potassium: 4 mmol/L (ref 3.5–5.1)
SODIUM: 142 mmol/L (ref 135–145)

## 2015-07-15 LAB — BRAIN NATRIURETIC PEPTIDE: B Natriuretic Peptide: 21 pg/mL (ref 0.0–100.0)

## 2015-07-21 ENCOUNTER — Encounter (HOSPITAL_COMMUNITY)
Admission: AD | Admit: 2015-07-21 | Discharge: 2015-07-21 | Disposition: A | Discharge status: Home / Self Care | Payer: Medicare Other | Source: Skilled Nursing Facility | Attending: Internal Medicine | Admitting: Internal Medicine

## 2015-07-21 DIAGNOSIS — J449 Chronic obstructive pulmonary disease, unspecified: Secondary | ICD-10-CM | POA: Diagnosis not present

## 2015-07-21 LAB — BASIC METABOLIC PANEL
Anion gap: 9 (ref 5–15)
BUN: 38 mg/dL — AB (ref 6–20)
CALCIUM: 8.9 mg/dL (ref 8.9–10.3)
CO2: 28 mmol/L (ref 22–32)
Chloride: 105 mmol/L (ref 101–111)
Creatinine, Ser: 1.64 mg/dL — ABNORMAL HIGH (ref 0.44–1.00)
GFR calc Af Amer: 30 mL/min — ABNORMAL LOW (ref >60)
GFR calc non Af Amer: 26 mL/min — ABNORMAL LOW (ref >60)
Glucose, Bld: 100 mg/dL — ABNORMAL HIGH (ref 65–99)
Potassium: 4.1 mmol/L (ref 3.5–5.1)
SODIUM: 142 mmol/L (ref 135–145)

## 2015-07-23 ENCOUNTER — Encounter: Payer: Self-pay | Admitting: Internal Medicine

## 2015-07-23 ENCOUNTER — Non-Acute Institutional Stay (SKILLED_NURSING_FACILITY): Payer: Medicare Other | Admitting: Internal Medicine

## 2015-07-23 DIAGNOSIS — N289 Disorder of kidney and ureter, unspecified: Secondary | ICD-10-CM

## 2015-07-23 DIAGNOSIS — I10 Essential (primary) hypertension: Secondary | ICD-10-CM | POA: Diagnosis not present

## 2015-07-23 DIAGNOSIS — I5030 Unspecified diastolic (congestive) heart failure: Secondary | ICD-10-CM

## 2015-07-23 DIAGNOSIS — F411 Generalized anxiety disorder: Secondary | ICD-10-CM | POA: Diagnosis not present

## 2015-07-23 NOTE — Progress Notes (Signed)
Patient ID: [name], female   DOB: [birth year], 81 y.o.   MRN: [id number]             Acute visit.  LOC-skilled.  Facility [facility]   Chief complaint- Acute visit secondary to renal insufficiency-follow-up hypertension     History of present illness  Patient is a pleasant elderly resident who has a history of diastolic CHF-she is on Lasix and we recently increase this secondary to some mild weight gain currently is on 60 mg 3 days a week and 40 mg all other days.  Her weight actually appears to have stabilized at 203 which is more her baseline she had been as high as 208 recently.  Edema appears to be somewhat improved she does have chronic venous stasis as well as.  Her creatinine has gone up it is on the higher end of her baseline at 1.6 form BUN of 38 in fact on November 16 her creatinine was 1.12 with BUN of 36.  She is eating and drinking fairly well per nursing.  She also has a history of hypertension she is on Lotensin 40 mg a day as well as Norvasc 5 mg a day she has variable blood pressures I see listed from 182/75 down to 115/70-I suspect some of this may be due to variation the way her blood pressures taken I did take it manually today and got 102/50.     . Family medical social history has been reviewed per history and physical on 02/07/2011  .  Medications reviewed per [facility] Aricept 10 mg daily.  Calcium, 500 mg 3 times a day.  Celexa 10 mg daily.  Claritin 10 mg daily.  DuoNeb nebulizers every 6 hours when necessary.  Lasix 40 mg daily--had been 60 mg Monday Wednesday Friday but will reduce this to 40 mg a day.  Lotensin 40 mg daily.  Lovastatin 40 mg daily at bedtime.  Mylanta when necessary.  .  Norvasc 5 mg daily  . o Eliquistt 5 mg twice a day  Potassium 10 mEq daily.  Protonix 40 mg twice a day.  Restasis eyedrops twice a day.  Robitussin every 6 hours when necessary.  Simbrinza eyedrops 3 times a day.  Tessalon pearls  100 mg twice a day when necessary cough a statin eyedrops 1 drop each eye daily at bedtime.  Tylenol Extra Strength 500 mg 2 tabs twice a day. And once a day when necessary.  Ativan 0.25 mg every 6 hours when necessary anxiety    .  Review of systems.  In general denies any fever or chills. -Weight appears to be stable  Slow decline past week . Skin does not complain of rashes or itching  Has venous stasis changes Head ears eyes nose mouth and throat  Had some history of allergic rhinitis -does not complain of any visual changes  .  Respiratory. has some intermittent shortness of breath this has not really new  Cardiac----- denies any chest pain or palpitations.currently--chronic lower extremity edema  GI-complains of occasional abdominal discomfort-this is chronic again and has been evaluated by GI does not complaining of acute abdominal discomfort  -- -  Musculoskeletal-does have a history of back pain and osteoarthritis but this is controlled apparently on Tylenol.  Neurologic-does not complaining of headache or dizziness. Or syncopal-type feelings  Psych-does have a history of anxiety depression and mood disorder--this has been relatively stable- Does not appear anxious today .  Marland Kitchen  Physical exam    Temperature 98.2  pulse 70 respirations 17 blood pressure 102/50 taken manually weight is 203.2     .  In general this is a pleasant elderly female in no distress     Her skin is warm and dry. --Has chronic stasis changes of her lower legs bilaterally Approximately 2-+ bilaterally--this may be slightly decreased compared to last exam   I  Eyes-pupils appear equal round reactive light --pu[pils appear reactive to light t   Oropharynx is clear --mucous membranes moist tongue is midline  Chest --minimal upper expiratory wheeze this is quite minimal-- somewhat shallow air entry- no labored breathing.--Essentially baseline exam  Heart is regular rate and rhythm she has I would  say --2---+ edema with venous stasis changes of her legs bilaterally - .  Abdomen obese soft nontender with positive bowel sounds==.  Muscle skeletal moves all extremities x4 at baseline does ambulate in a wheelchair I do not note any deformities.  Neurologic-appears grossly intact I do not see a focal weaknesses or lateralizing findings strength appears to be intact on 4 extremities cranial nerves intact her speech is clear--touch sensation of her lower extremities and feet appears to be intact  Psych  is oriented to self is conversant and cognizant although does have some confusion at times-- is somewhat anxious.---  .  Labs  07/21/2015.  Sodium 142 potassium 4.1 BUN 38 creatinine 1.64.  Liver 16 2016.  Sodium 142 potassium 4 BUN 36 creatinine 1.12.     06/18/2015.  Sodium 143 potassium 3.9 BUN 45 creatinine 1.55.  WBC 5.0 hemoglobin 12.4 platelets 188  06/01/2015.  Sodium 139 potassium 4.1 BUN 46 creatinine 1.42.  WBC 4.9 hemoglobin 12.7 platelets 199.    04/10/2015.  TSH-1.454.  03/23/2015.  Sodium 142 potassium 4 BUN 44 creatinine 1.35 CO2 26.  02/18/2015.  BNP 15.0.  Liver function tests within normal limits except AST 14-ALT 12.  Cholesterol 232 triglyceride 71 HDL 112 LDL 106.  WBC 4.8 hemoglobin 12.3 platelets 174  03/02/2015.  Sodium 143 potassium 4 BUN 38 creatinine 1.27 CO2 30.  02/18/2015.  BNP 15.0.  Sodium 142 potassium 4.3 BUN 38 creatinine 1.45.  AST 14-ALT 12-albumin 4.0.  Cholesterol 232 triglyceride 71 HDL 112 LDL 106.  WBC 4.8 hemoglobin 12.3 platelets 174  02/05/2015.  Sodium 143 potassium 3.8 BUN 29 creatinine 1.19 CO2 29.  12/04/2014.  WBC 4.4 hemoglobin 11.8 platelets 197  11/11/2014.  Sodium 144 potassium 3.8 BUN 31 creatinine 1.09.  10/18/2014.  BNP 17.  WBC 5.1 hemoglobin 11.3 platelets 178    10/03/2014.  Sodium 142 potassium 4.2 BUN 46 creatinine 1.21  08/21/2014.  WBC 5.0 hemoglobin 12.2  platelets 2:15.  Sodium 140 potassium 4.2 BUN 28 creatinine 1.16-liver function tests within normal limits.  Cholesterol 229-triglycerides 97-HDL 97-LDL 113  05/28/2014.  WBC 4.6 hemoglobin 12.8 platelets 195.  Sodium 141 potassium 4.2 BUN 43 creatinine 1.23.  Liver function tests within normal limits except alkaline phosphatase 131       .       Assessment and plan  Renal insufficiency with history of diastolic CHF.--Creatinine does appear to be going up some will hold Lasix for 1 day and then reduce her Lasix back to baseline 40 mg a day-her weight appears to be slowly declining I suspect this is edema related-continue to monitor her weights  she appears to be stable.  Will update a basic metabolic panel on Monday, November 28    . --HTN--- currently on Lotensin and Norvasc - quite a very bit  of variability although I do not see consistent elevations I suspect some of the variability maybe be due to smaller cuff-- taken with a larger cuff today blood pressure was stable at 102/50.  Anxiety-at times this is an issue but appears to be stable today-she continues on when necessary Ativan she is also on Celexa for coexistent depression she appears to be quite stable today--less anxious than when I saw her previously  [id number]--   ...  -         .    .  # s

## 2015-07-27 ENCOUNTER — Encounter (HOSPITAL_COMMUNITY)
Admission: RE | Admit: 2015-07-27 | Discharge: 2015-07-27 | Disposition: A | Discharge status: Home / Self Care | Payer: Medicare Other | Source: Skilled Nursing Facility | Attending: Internal Medicine | Admitting: Internal Medicine

## 2015-07-27 DIAGNOSIS — J449 Chronic obstructive pulmonary disease, unspecified: Secondary | ICD-10-CM | POA: Diagnosis not present

## 2015-07-27 LAB — BASIC METABOLIC PANEL
Anion gap: 9 (ref 5–15)
BUN: 35 mg/dL — AB (ref 6–20)
CALCIUM: 9.1 mg/dL (ref 8.9–10.3)
CHLORIDE: 102 mmol/L (ref 101–111)
CO2: 28 mmol/L (ref 22–32)
CREATININE: 1.56 mg/dL — AB (ref 0.44–1.00)
GFR calc Af Amer: 32 mL/min — ABNORMAL LOW (ref >60)
GFR calc non Af Amer: 28 mL/min — ABNORMAL LOW (ref >60)
Glucose, Bld: 107 mg/dL — ABNORMAL HIGH (ref 65–99)
Potassium: 3.6 mmol/L (ref 3.5–5.1)
SODIUM: 139 mmol/L (ref 135–145)

## 2015-07-29 ENCOUNTER — Other Ambulatory Visit: Payer: Self-pay | Admitting: *Deleted

## 2015-07-29 ENCOUNTER — Non-Acute Institutional Stay (SKILLED_NURSING_FACILITY): Payer: Medicare Other | Admitting: Internal Medicine

## 2015-07-29 DIAGNOSIS — I10 Essential (primary) hypertension: Secondary | ICD-10-CM | POA: Diagnosis not present

## 2015-07-29 DIAGNOSIS — R05 Cough: Secondary | ICD-10-CM | POA: Diagnosis not present

## 2015-07-29 DIAGNOSIS — F329 Major depressive disorder, single episode, unspecified: Secondary | ICD-10-CM | POA: Diagnosis not present

## 2015-07-29 DIAGNOSIS — N289 Disorder of kidney and ureter, unspecified: Secondary | ICD-10-CM

## 2015-07-29 DIAGNOSIS — F0390 Unspecified dementia without behavioral disturbance: Secondary | ICD-10-CM | POA: Diagnosis not present

## 2015-07-29 DIAGNOSIS — F411 Generalized anxiety disorder: Secondary | ICD-10-CM | POA: Diagnosis not present

## 2015-07-29 DIAGNOSIS — R059 Cough, unspecified: Secondary | ICD-10-CM

## 2015-07-29 MED ORDER — BENZONATATE 100 MG PO CAPS: 100.0000 mg | ORAL_CAPSULE | Freq: Two times a day (BID) | ORAL | Status: DC | PRN

## 2015-07-29 NOTE — Progress Notes (Signed)
Patient ID: [name], female   DOB: 1922/01/24, 81 y.o.   MRN: [id number]        Acute visit.  LOC-skilled.  Facility [facility]   Chief complaint- Acute visit secondary to cough--follow-up renal insufficiency     History of present illness  Patient is a pleasant elderly resident who is complaining of a cough-apparently she's had this the past couple days-she does have an order for duo nebs when necessary every 6 hours but I'm not sure how often she is receiving these.  She does not really complain of increased shortness of breath beyond baseline-she is afebrile she does not report any chills.  The cough is apparently productive of clear phlegm---  Apparently she's had Tessalon Perles in the past  Patient does have a history of diastolic CHF at one point she did gain some weight and we increased her Lasix slightly to 60 mg 3 times a day and continued 40 mg all other days-her creatinine did rise somewhat to the higher end of her baseline-subsequently we have cut her Lasix back to 40 mg a day-most recent creatinine as of November 20 was 1.56 which is a slight improvement from the previous one of 1.64 on November 22.           . Family medical social history has been reviewed per history and physical on 02/07/2011  .  Medications reviewed per [facility] Aricept 10 mg daily.  Calcium, 500 mg 3 times a day.  Celexa 10 mg daily.  Claritin 10 mg daily.  DuoNeb nebulizers every 6 hours when necessary.  Lasix 40 mg daily--.  Lotensin 40 mg daily.  Lovastatin 40 mg daily at bedtime.  Mylanta when necessary.  .  Norvasc 5 mg daily  . Eliquis 5 mg twice a day  Potassium 10 mEq daily.  Protonix 40 mg twice a day.  Restasis eyedrops twice a day.  Robitussin every 6 hours when necessary.  Simbrinza eyedrops 3 times a day.  Tessalon pearls 100 mg twice a day when necessary   .  Tylenol Extra Strength 500 mg 2 tabs twice a day. And once a day when  necessary.  Ativan 0.25 mg every 6 hours when necessary anxiety    .  Review of systems.  In general denies any fever or chills. -Weight appears to be stable   . Skin does not complain of rashes or itching  Has venous stasis changes Head ears eyes nose mouth and throat  Had some history of allergic rhinitis -does not complain of any visual changes  .  Respiratory. has some intermittent shortness of breath this has not really new--but her cough has increased apparently this is productive at times of clear phlegm  Cardiac----- denies any chest pain or palpitations.currently--chronic lower extremity edema  GI-complains of occasional abdominal discomfort-this is chronic again and has been evaluated by GI does not complaining of acute abdominal discomfort  -- -  Musculoskeletal-does have a history of back pain and osteoarthritis but this is controlled  on Tylenol.  Neurologic-does not complaining of headache or dizziness. Or syncopal-type feelings  Psych-does have a history of anxiety depression and mood disorder--this has been relatively stable- Does not appear anxious today .  Marland Kitchen  Physical exam    She is afebrile pulse of 80 respirations 18 blood pressure 105/60     .  In general this is a pleasant elderly female in no distress     Her skin is warm and dry. --Has chronic  stasis changes of her lower legs bilaterally Approximately 2-+ bilaterally--this appears relatively unchanged from previous exam  I  Eyes-pupils appear equal round reactive light --pu[pils appear reactive to light t   Oropharynx is clear --mucous membranes moist tongue is midline  Chest ---- somewhat shallow air entry- no labored breathing.--Some slight wheezing although this is not greatly changed from baseline however she does cough with any attempt at deep inspiration  Heart is regular rate and rhythm she has I would say --2---+ edema with venous stasis changes of her legs bilaterally - .  Abdomen obese  soft nontender with positive bowel sounds==.  Muscle skeletal moves all extremities x4 at baseline does ambulate in a wheelchair I do not note any deformities.  Neurologic-appears grossly intact I do not see a focal weaknesses or lateralizing findings strength appears to be intact on 4 extremities cranial nerves intact her speech is clear--touch sensation of her lower extremities and feet appears to be intact  Psych  is oriented to self is conversant and cognizant although does have some confusion at times-- is somewhat anxious.---  .  Labs  07/27/2015.  Sodium 139 potassium 3.6 CO2 level XXVIII BUN 35 creatinine 1.56.    07/21/2015.  Sodium 142 potassium 4.1 BUN 38 creatinine 1.64.  Liver 16 2016.  Sodium 142 potassium 4 BUN 36 creatinine 1.12.     06/18/2015.  Sodium 143 potassium 3.9 BUN 45 creatinine 1.55.  WBC 5.0 hemoglobin 12.4 platelets 188  06/01/2015.  Sodium 139 potassium 4.1 BUN 46 creatinine 1.42.  WBC 4.9 hemoglobin 12.7 platelets 199.    04/10/2015.  TSH-1.454.  03/23/2015.  Sodium 142 potassium 4 BUN 44 creatinine 1.35 CO2 26.  02/18/2015.  BNP 15.0.  Liver function tests within normal limits except AST 14-ALT 12.  Cholesterol 232 triglyceride 71 HDL 112 LDL 106.  WBC 4.8 hemoglobin 12.3 platelets 174  03/02/2015.  Sodium 143 potassium 4 BUN 38 creatinine 1.27 CO2 30.  02/18/2015.  BNP 15.0.  Sodium 142 potassium 4.3 BUN 38 creatinine 1.45.  AST 14-ALT 12-albumin 4.0.  Cholesterol 232 triglyceride 71 HDL 112 LDL 106.  WBC 4.8 hemoglobin 12.3 platelets 174  02/05/2015.  Sodium 143 potassium 3.8 BUN 29 creatinine 1.19 CO2 29.  12/04/2014.  WBC 4.4 hemoglobin 11.8 platelets 197  11/11/2014.  Sodium 144 potassium 3.8 BUN 31 creatinine 1.09.  10/18/2014.  BNP 17.  WBC 5.1 hemoglobin 11.3 platelets 178         Assessment and plan  Cough-will order Mucinex 600 mg twice a day for 7 days-also obtain a chest x-ray  two-view-cough  does appear to be increased from her baseline.  Also will make the duo nebs routine every 6 hours for 72 hours then back to when necessary.  Monitor vital signs pulse ox every shift for 72 hours  Renal insufficiency with history of diastolic CHF.- Clinically she appears stable creatinine appears to have stabilized at 1.56 which is a higher end of her baseline Will update this first laboratory day next week    . --HTN--- currently on Lotensin and Norvasc -  This appears stable at 105/60-I see listed higher readings previously but one would wonder whether this is actually taken manually and with a larger cuff     [id number]--   ...  -         .    .  # s

## 2015-07-29 NOTE — Telephone Encounter (Signed)
[facility] 

## 2015-07-30 ENCOUNTER — Ambulatory Visit (HOSPITAL_COMMUNITY)
Admission: RE | Admit: 2015-07-30 | Discharge: 2015-07-30 | Disposition: A | Discharge status: Home / Self Care | Payer: Medicare Other | Source: Ambulatory Visit | Attending: Internal Medicine | Admitting: Internal Medicine

## 2015-07-30 DIAGNOSIS — R05 Cough: Secondary | ICD-10-CM | POA: Diagnosis not present

## 2015-07-30 DIAGNOSIS — R0989 Other specified symptoms and signs involving the circulatory and respiratory systems: Secondary | ICD-10-CM | POA: Insufficient documentation

## 2015-08-03 ENCOUNTER — Encounter (HOSPITAL_COMMUNITY)
Admission: RE | Admit: 2015-08-03 | Discharge: 2015-08-03 | Disposition: A | Discharge status: Home / Self Care | Payer: Medicare Other | Source: Skilled Nursing Facility | Attending: Internal Medicine | Admitting: Internal Medicine

## 2015-08-03 DIAGNOSIS — I509 Heart failure, unspecified: Secondary | ICD-10-CM | POA: Insufficient documentation

## 2015-08-03 DIAGNOSIS — E875 Hyperkalemia: Secondary | ICD-10-CM | POA: Diagnosis not present

## 2015-08-03 LAB — BASIC METABOLIC PANEL
ANION GAP: 7 (ref 5–15)
BUN: 32 mg/dL — AB (ref 6–20)
CHLORIDE: 108 mmol/L (ref 101–111)
CO2: 27 mmol/L (ref 22–32)
Calcium: 9 mg/dL (ref 8.9–10.3)
Creatinine, Ser: 1.4 mg/dL — ABNORMAL HIGH (ref 0.44–1.00)
GFR, EST AFRICAN AMERICAN: 37 mL/min — AB (ref >60)
GFR, EST NON AFRICAN AMERICAN: 32 mL/min — AB (ref >60)
Glucose, Bld: 96 mg/dL (ref 65–99)
POTASSIUM: 3.8 mmol/L (ref 3.5–5.1)
SODIUM: 142 mmol/L (ref 135–145)

## 2015-08-21 ENCOUNTER — Non-Acute Institutional Stay (SKILLED_NURSING_FACILITY): Payer: Medicare Other | Admitting: Internal Medicine

## 2015-08-21 ENCOUNTER — Encounter: Payer: Self-pay | Admitting: Internal Medicine

## 2015-08-21 DIAGNOSIS — I82402 Acute embolism and thrombosis of unspecified deep veins of left lower extremity: Secondary | ICD-10-CM

## 2015-08-21 DIAGNOSIS — I10 Essential (primary) hypertension: Secondary | ICD-10-CM

## 2015-08-21 DIAGNOSIS — F03918 Unspecified dementia, unspecified severity, with other behavioral disturbance: Secondary | ICD-10-CM

## 2015-08-21 DIAGNOSIS — N289 Disorder of kidney and ureter, unspecified: Secondary | ICD-10-CM | POA: Diagnosis not present

## 2015-08-21 DIAGNOSIS — F0391 Unspecified dementia with behavioral disturbance: Secondary | ICD-10-CM

## 2015-08-21 DIAGNOSIS — R609 Edema, unspecified: Secondary | ICD-10-CM

## 2015-08-21 NOTE — Progress Notes (Signed)
Patient ID: [name], female   DOB: Nov 22, 1921, 81 y.o.   MRN: [id number]             Routine visit.  LOC-skilled.  Facility [facility]   Chief complaint-medical management of dementia renal insufficiency dysphagia hyperlipidemia depression- GERD--history left leg DVT Acute visit secondary to question increased edema-     History of present illness .  Patient is a pleasant elderly resident with the above diagnoses  At times she will develop increased cough she usually responds to a course of Mucinex-chest ray has not really shown any acute process recently .   she has chronic lower extremity edema and fact in September  Doppler did show a minimal nonocclusive possibly acute DVT in the left distal femoral vein-she has been started on  Eliquis and appears to be tolerating this unremarkably.  She has a history of chronic lower extremity edema venous stasis changes-nursing staff has noted  -possibly some slightly increased edema  ---She does have a history of diastolic CHF however BNPs in the past have consistently been unremarkable Review of weights today this appears to be relatively stable most recently 204.2- December 12 was 203.6-December 5 was 204.6    She does have a history of renal insufficiency creatinine most recently creatinine was 1.4 with BUN of 32 on December 5 which shows stability-last month her creatinine did rise up to 1.6 at that time we had her on Lasix 40 mg in the morning 20 mg later in the day this was reduced back to 40 a day and creatinine appears to have responded favorably.  Her weight appears to fluctuate at 204.2 most recently this appears relatively stable as noted above at one point she was up to 208 although I suspect some scale variation may be contributing to some of this variation     Her vital signs appear to be stable  although at times her blood pressure does vary -most recent reading 132/56-I see previous one of 146/63-there is some  variation but I do not see consistent elevations which has been the case for some time here -she is on Norvasc 5 mg a day as well as Lotensin 40 mg a day--I suspect some of the blood pressure variation is due to the size of the cuff Larger cuff usually gives more accurate readings which are lower     She does have a history of GERD is now on protonix twice a day---.   staes she has trouble swallowing at times----- in the past-she has had a GI consult-they ordered a CT scan of the abdomen which did not show any acute process and  showed colonic diverticulosis without evidence of acute diverticulitis-also a fibroid uterus In 2012 she did have an EGD which basically showed age-related esophageal motility decline but no acute issue Per nursing staff she is not complaining of dysphagia recently     Regards to her other issues--  she has a history of depressive symptoms-she is on Celexa.--Also was on Depakote as a mood stabilizer which appears to help  Her main concern today is his lower extremity edema which appears to wax and wane-I suspect some of this is dependent related and when patient has her legs in a dependent position they tend to get bigger.  .      .  . Family medical social history has been reviewed per history and physical on 02/07/2011  .  Medications reviewed per [facility] Aricept 10 mg daily.  Calcium, 500 mg  3 times a day.  Celexa 10 mg daily.  Claritin 10 mg daily.  DuoNeb nebulizers every 6 hours when necessary.  Lasix 40 mg daily.  Lotensin 40 mg daily.  Lovastatin 40 mg daily at bedtime.  Mylanta when necessary.  .  Norvasc 5 mg daily  . o Eliquistt 5 mg twice a day  Potassium 10 mEq daily.  Protonix 40 mg twice a day.  Restasis eyedrops twice a day.  Robitussin every 6 hours when necessary.  Simbrinza eyedrops 3 times a day.  Tessalon pearls 100 mg twice a day when necessary cough a statin eyedrops 1 drop each eye daily at  bedtime.  Tylenol Extra Strength 500 mg 2 tabs twice a day. And once a day when necessary.  Ativan 0.25 mg every 6 hours when necessary anxiety    .  Review of systems.  In general denies any fever or chills. -Weight appears to be stable . Skin does not complain of rashes or itching  Has venous stasis changes Head ears eyes nose mouth and throat  Had some history of allergic rhinitis -does not complain of any visual changes  .  Respiratory. has some intermittent shortness of breath this has not really new  Cardiac----- denies any chest pain or palpitations.currently--chronic lower extremity edema--per nursing and patient they believe this is slightly increased recently  GI-complains of occasional abdominal discomfort-this is chronic again and has been evaluated by GI does not complaining of acute abdominal discomfort  -- -  Musculoskeletal-does have a history of back pain and osteoarthritis but this is controlled  on Tylenol.  Neurologic-does not complaining of headache or dizziness. Or syncopal-type feelings  Psych-does have a history of anxiety depression and mood disorder--this has been relatively stable-- With occasional anxiety episodes .  Marland Kitchen  Physical exam   Temperature 98.3 pulse 71 respirations 20 blood pressure 132/56-weight is 204.2     .  In general this is a pleasant elderly female in no distress     Her skin is warm and dry. --Has chronic stasis changes of her lower legs bilaterally Approximately 2-3+ bilaterally--this may be slightly increased   I  Eyes-pupils appear equal round reactive light --pu[pils appear reactive to light t   Oropharynx is clear --mucous membranes moist tongue is midline  Chest --CTA- somewhat shallow air entry- no labored breathing.  Heart is regular rate and rhythm she has I would say --2--3-+ edema with venous stasis changes of her legs bilaterally -edema left is slightly more than the right again she does have a history of DVT .   Abdomen obese soft nontender with positive bowel sounds==.  Muscle skeletal moves all extremities x4 at baseline does ambulate in a wheelchair I do not note any deformities.  Neurologic-appears grossly intact I do not see a focal weaknesses or lateralizing findings strength appears to be intact on 4 extremities cranial nerves intact her speech is clear--touch sensation of her lower extremities and feet appears to be intact --has tenderness when her legs are palpated but this is not new Psych  is oriented to self is conversant and cognizant although does have some confusion at times-- is somewhat anxious.---But not overtly so today  .  Labs  08/03/2015.  Sodium 142 potassium 3.8 BUN 32 creatinine 1.40.  07/15/2015.  BNP 21.     06/18/2015.  Sodium 143 potassium 3.9 BUN 45 creatinine 1.55.  WBC 5.0 hemoglobin 12.4 platelets 188  06/01/2015.  Sodium 139 potassium 4.1 BUN 46 creatinine 1.42.  WBC 4.9 hemoglobin 12.7 platelets 199.    04/10/2015.  TSH-1.454.  03/23/2015.  Sodium 142 potassium 4 BUN 44 creatinine 1.35 CO2 26.  02/18/2015.  BNP 15.0.  Liver function tests within normal limits except AST 14-ALT 12.  Cholesterol 232 triglyceride 71 HDL 112 LDL 106.  WBC 4.8 hemoglobin 12.3 platelets 174  03/02/2015.  Sodium 143 potassium 4 BUN 38 creatinine 1.27 CO2 30.  02/18/2015.  BNP 15.0.  Sodium 142 potassium 4.3 BUN 38 creatinine 1.45.  AST 14-ALT 12-albumin 4.0.  Cholesterol 232 triglyceride 71 HDL 112 LDL 106.  WBC 4.8 hemoglobin 12.3 platelets 174  02/05/2015.  Sodium 143 potassium 3.8 BUN 29 creatinine 1.19 CO2 29.  12/04/2014.  WBC 4.4 hemoglobin 11.8 platelets 197  11/11/2014.  Sodium 144 potassium 3.8 BUN 31 creatinine 1.09.  10/18/2014.  BNP 17.  WBC 5.1 hemoglobin 11.3 platelets 178    10/03/2014.  Sodium 142 potassium 4.2 BUN 46 creatinine 1.21  08/21/2014.  WBC 5.0 hemoglobin 12.2 platelets 2:15.  Sodium 140  potassium 4.2 BUN 28 creatinine 1.16-liver function tests within normal limits.  Cholesterol 229-triglycerides 97-HDL 97-LDL 113  05/28/2014.  WBC 4.6 hemoglobin 12.8 platelets 195.  Sodium 141 potassium 4.2 BUN 43 creatinine 1.23.  Liver function tests within normal limits except alkaline phosphatase 131    03/31/2014.  Sodium 143 potassium 4.3 BUN 32 creatinine 1.27.  03/07/2014.  WBC 5.2 hemoglobin 11.6 platelets 193.    02/12/2014.  Sodium 145 potassium 4.4 BUN 45 creatinine 1.32  01/22/2014.  Cholesterol 240 triglycerides 100 HDL 112 LDL 108.  WBC 4.5 hemoglobin 12.2 platelets 178.  Sodium 142 potassium 4.3 BUN 36 creatinine 1.18.  Liver function tests within normal limits except alkaline phosphatase minimally elevated at 138.  01/08/2014.  Sodium 145 potassium 4.3 BUN 39 creatinine 1.39.  12/23/2013.  Sodium 142 potassium 4.3 BUN 35 creatinine 1.16.  12/18/2013.  WBC 5.0 hemoglobin 11.3 platelets 151.  Sodium 144 potassium 3.8 BUN 29 creatinine 1.08.  Liver function tests within normal limits except alkaline phosphatase minimally elevated at 120 bilirubin 0.2 albumin 3.1.  Val acid-40.3.  09/18/2013.  Sodium 142 potassium 4.0 BUN 28 creatinine 1.25.  Liver function tests within normal limits except alkaline phosphatase of 146.  WBC 4.6 hemoglobin 13.6 platelets 173.  Depakote level 54.3.  06/10/2013.  Sodium 142 potassium 4.2 BUN 40 creatinine 1.2 .  05/23/2013.  Sodium 141 potassium 4.2 BUN 36 creatinine 1.33.  Marland Kitchen       Assessment and plan  .  Number 1--HTN--- currently on Lotensin and Norvasc - At this point appears stablel occasionally are some systolic elevations but these are not consistent   ...  -  x  #2-dementia-this appears to be relatively mild she continues on Aricept with Depakote as a mood stabilizer.-     #3 CHF--edema---  Currently on Lasix 40-milligrams a day- As well as potassium 10 mEq day-w Possibly slightly increased  today-this is a challenging balancing situation with her history of renal insufficiency will increase her Lasix to 40 mg in the morning 20 mg later in the day  for 2 days- seems with her Lasix consistently 60 mg a day her creatinine tends to go up so we'll do this for short duration to see get some improvement in the edema also be largely dependent related she will have to be encouraged to elevate her legs as much as possible-her weight actually appears to be stable-not be real aggressive with increasing the Lasix for any duration of time  since clinically she appears stable and has stable weight- Will also update a BMP to keep an eye on her renal function and electrolytes she is on potassium supplementation-also weigh patient tomorrow and Mondays and Fridays notify provider of gain greater than 3 pounds.  Leg elevation also will have to be encouraged      #4-dysphagia-GERD--continues on Protonix twice a day-this has been followed by GI-CT scan of the abdomen again appear to be relatively benign-  She is not really complaining of dysphasia today-although she will complain of this intermittently .  #5 renal insufficiency-this appears to be relatively stable we'll update Metabolic panel.-   #6 hyperlipidemia-t On Mevacor-- t--of note she has consistently high HDLs--and mildly elevated alkaline phosphatase-recent liver function tests however were within normal limits --r HDL is quite high at 112--LDL 106-with patient's advanced age will not be real aggressive with this-- L .  #7-depression this appears stable on Celexa--she does have periods of anxiety and she actually was seen by the psychiatric nurse practitioner recentlyher Ativan dose was changed to 0.25 mg twice a day routine she previously had been on this routinely in the morning and then had when necessary doseslater in the day .  #8-anemia- suspect there is an element of chronic disease here -this has been stable to slightly improved with a  hemoglobin of 12.4--on 06/18/15--we will update this for current values .  #9-history of allergic rhinitis this has been asymptomatic for a while.--Claritin appears to be effective she is also on Flonase   10-gout-this is been asymptomatic for a while continue to monitor--she continues to have a sensitivity when her lower extremities are palpated but this does not appear to be any change from her baseline  11 history of left leg DVT  This appears to be stable and fairly minimal she is on Eliquistfor anticoagulation--still has some slightly increased edema on the left versus the right this does not appear to be overtly dramatic  CPT-99310--of note greater than 35 minutes spent assessing patient-discussing her concerns at bedside-reviewing her chart and discussing her status with nursing staff -and coordinating and formulating a plan of care-no greater than 50% of time spent coordinating plan of carefor numerous diagnoses

## 2015-08-22 ENCOUNTER — Encounter (HOSPITAL_COMMUNITY)
Admission: AD | Admit: 2015-08-22 | Discharge: 2015-08-22 | Disposition: A | Discharge status: Home / Self Care | Payer: Medicare Other | Source: Skilled Nursing Facility | Attending: Internal Medicine | Admitting: Internal Medicine

## 2015-08-22 DIAGNOSIS — I509 Heart failure, unspecified: Secondary | ICD-10-CM | POA: Diagnosis not present

## 2015-08-22 DIAGNOSIS — E875 Hyperkalemia: Secondary | ICD-10-CM | POA: Diagnosis not present

## 2015-08-22 LAB — CBC WITH DIFFERENTIAL/PLATELET
BASOS ABS: 0 10*3/uL (ref 0.0–0.1)
BASOS PCT: 0 %
EOS PCT: 3 %
Eosinophils Absolute: 0.2 10*3/uL (ref 0.0–0.7)
HCT: 36.2 % (ref 36.0–46.0)
Hemoglobin: 11.5 g/dL — ABNORMAL LOW (ref 12.0–15.0)
Lymphocytes Relative: 37 %
Lymphs Abs: 2.3 10*3/uL (ref 0.7–4.0)
MCH: 28.8 pg (ref 26.0–34.0)
MCHC: 31.8 g/dL (ref 30.0–36.0)
MCV: 90.7 fL (ref 78.0–100.0)
MONO ABS: 0.6 10*3/uL (ref 0.1–1.0)
Monocytes Relative: 9 %
Neutro Abs: 3.2 10*3/uL (ref 1.7–7.7)
Neutrophils Relative %: 51 %
PLATELETS: 201 10*3/uL (ref 150–400)
RBC: 3.99 MIL/uL (ref 3.87–5.11)
RDW: 15 % (ref 11.5–15.5)
WBC: 6.3 10*3/uL (ref 4.0–10.5)

## 2015-08-22 LAB — BASIC METABOLIC PANEL
ANION GAP: 8 (ref 5–15)
BUN: 49 mg/dL — ABNORMAL HIGH (ref 6–20)
CALCIUM: 9.1 mg/dL (ref 8.9–10.3)
CO2: 28 mmol/L (ref 22–32)
CREATININE: 1.81 mg/dL — AB (ref 0.44–1.00)
Chloride: 106 mmol/L (ref 101–111)
GFR calc non Af Amer: 23 mL/min — ABNORMAL LOW (ref >60)
GFR, EST AFRICAN AMERICAN: 27 mL/min — AB (ref >60)
Glucose, Bld: 137 mg/dL — ABNORMAL HIGH (ref 65–99)
Potassium: 4.4 mmol/L (ref 3.5–5.1)
Sodium: 142 mmol/L (ref 135–145)

## 2015-08-25 ENCOUNTER — Encounter (HOSPITAL_COMMUNITY)
Admission: AD | Admit: 2015-08-25 | Discharge: 2015-08-25 | Disposition: A | Discharge status: Home / Self Care | Payer: Medicare Other | Source: Skilled Nursing Facility | Attending: Internal Medicine | Admitting: Internal Medicine

## 2015-08-25 DIAGNOSIS — I509 Heart failure, unspecified: Secondary | ICD-10-CM | POA: Diagnosis not present

## 2015-08-25 DIAGNOSIS — E875 Hyperkalemia: Secondary | ICD-10-CM | POA: Diagnosis not present

## 2015-08-25 LAB — BASIC METABOLIC PANEL
Anion gap: 7 (ref 5–15)
BUN: 42 mg/dL — AB (ref 6–20)
CHLORIDE: 107 mmol/L (ref 101–111)
CO2: 28 mmol/L (ref 22–32)
Calcium: 9 mg/dL (ref 8.9–10.3)
Creatinine, Ser: 1.4 mg/dL — ABNORMAL HIGH (ref 0.44–1.00)
GFR calc Af Amer: 37 mL/min — ABNORMAL LOW (ref >60)
GFR calc non Af Amer: 32 mL/min — ABNORMAL LOW (ref >60)
Glucose, Bld: 106 mg/dL — ABNORMAL HIGH (ref 65–99)
POTASSIUM: 4.1 mmol/L (ref 3.5–5.1)
SODIUM: 142 mmol/L (ref 135–145)

## 2015-08-26 ENCOUNTER — Non-Acute Institutional Stay (SKILLED_NURSING_FACILITY): Payer: Medicare Other | Admitting: Internal Medicine

## 2015-08-26 ENCOUNTER — Encounter: Payer: Self-pay | Admitting: Internal Medicine

## 2015-08-26 DIAGNOSIS — R600 Localized edema: Secondary | ICD-10-CM

## 2015-08-26 DIAGNOSIS — N289 Disorder of kidney and ureter, unspecified: Secondary | ICD-10-CM

## 2015-08-26 NOTE — Progress Notes (Signed)
Patient ID: [name], female   DOB: 1922-08-19, 81 y.o.   MRN: [id number]              Acute visit.  LOC-skilled.  Facility [facility]   Chief complaint-acute visit follow-up edema-renal insufficiency -     History of present illness .  Patient is a pleasant elderly resident with the above diagnoses    .   she has chronic lower extremity edema and fact in September  Doppler did show a minimal nonocclusive possibly acute DVT in the left distal femoral vein-she has been started on  Eliquis and appears to be tolerating this unremarkably.  She has a history of chronic lower extremity edema venous stasis changes- Last week nursing staff noted possibly some  increased edema  ---She does have a history of diastolic CHF however BNPs in the past have consistently been unremarkable  Review of weights show relative stability but edema did appear to be somewhat increased lower extremities-her Lasix was increased up to 60 mg total day.  However subsequent metabolic panel showed a creatinine was up to 1.81 with a BUN of 49-her baseline appears to be more in the mid ones and orders were to hold her Lasix as well as potassium.  Updated lab done yesterday shows creatinine has now closer to her baseline at 1.4 with a BUN of 42.  Her weight appears to be stable per recent weight at 203      .      .  . Family medical social history has been reviewed per history and physical on 02/07/2011  .  Medications reviewed per [facility] Aricept 10 mg daily.  Calcium, 500 mg 3 times a day.  Celexa 10 mg daily.  Claritin 10 mg daily.  DuoNeb nebulizers every 6 hours when necessary.  Lasix 40 mg daily. Currently held  Lotensin 40 mg daily.  Lovastatin 40 mg daily at bedtime.  Mylanta when necessary.  .  Norvasc 5 mg daily  . o Eliquistt 5 mg twice a day  Potassium 10 mEq daily. Currently held  Protonix 40 mg twice a day.  Restasis eyedrops twice a day.  Robitussin  every 6 hours when necessary.  Simbrinza eyedrops 3 times a day.  Tessalon pearls 100 mg twice a day when necessary cough a statin eyedrops 1 drop each eye daily at bedtime.  Tylenol Extra Strength 500 mg 2 tabs twice a day. And once a day when necessary.  Ativan 0.25 mg every 6 hours when necessary anxiety    .  Review of systems.  In general denies any fever or chills. -Weight appears to be stable . Skin does not complain of rashes or itching  Has venous stasis changes Head ears eyes nose mouth and throat  Had some history of allergic rhinitis -does not complain of any visual changes  .  Respiratory. has some intermittent shortness of breath this has not really new does not complain of shortness of breath today act she says she feels better  Cardiac----- denies any chest pain or palpitations.currently--chronic lower extremity edema as noted above   -- s  Psych-does have a history of anxiety depression and mood disorder--this has been relatively stable-- With occasional anxiety episodes .  Marland Kitchen  Physical exam    Temperature is 97.8 pulse 68 respirations 18 blood pressure 128/49 weight appears to be stable at 203     .  In general this is a pleasant elderly female in no distress  Her skin is warm and dry. --Has chronic stasis changes of her lower legs bilaterally Approximately 2-3+ bilaterally--more so on the left versus the right this does not appear increased from previous exam  I  Eyes-pupils appear equal round reactive light --pu[pils appear reactive to light t   Oropharynx is clear --mucous membranes moist tongue is midline  Chest --CTA- somewhat shallow air entry- no labored breathing.  Heart is regular rate and rhythm she has I would say --2--3-+ edema with venous stasis changes of her legs bilaterally -edema left is slightly more than the right again she does have a history of DVT .  Abdomen obese soft nontender with positive bowel sounds==.  Muscle skeletal  moves all extremities x4 at baseline does ambulate in a wheelchair I do not note any deformities.  Neurologic-appears grossly intact  cranial nerves intact her speech is clear--  Psych  is oriented to self is conversant and cognizant although does have some confusion at times-does not really appear anxious today-    .  Labs  08/25/2015.  Sodium 142 potassium 4.1 BUN 42 creatinine 1.4.  08/22/2015.  Sodium 142 potassium 4.4 BUN 49 creatinine 1.81.  WBC 6.3 hemoglobin 11.5 platelets 201  08/03/2015.  Sodium 142 potassium 3.8 BUN 32 creatinine 1.40.  07/15/2015.  BNP 21.     06/18/2015.  Sodium 143 potassium 3.9 BUN 45 creatinine 1.55.  WBC 5.0 hemoglobin 12.4 platelets 188  06/01/2015.  Sodium 139 potassium 4.1 BUN 46 creatinine 1.42.  WBC 4.9 hemoglobin 12.7 platelets 199.    04/10/2015.  TSH-1.454.  03/23/2015.  Sodium 142 potassium 4 BUN 44 creatinine 1.35 CO2 26.  02/18/2015.  BNP 15.0.  Liver function tests within normal limits except AST 14-ALT 12.  Cholesterol 232 triglyceride 71 HDL 112 LDL 106.  WBC 4.8 hemoglobin 12.3 platelets 174  03/02/2015.  Sodium 143 potassium 4 BUN 38 creatinine 1.27 CO2 30.  02/18/2015.  BNP 15.0.  Sodium 142 potassium 4.3 BUN 38 creatinine 1.45.  AST 14-ALT 12-albumin 4.0.  Cholesterol 232 triglyceride 71 HDL 112 LDL 106.  WBC 4.8 hemoglobin 12.3 platelets 174  02/05/2015.  Sodium 143 potassium 3.8 BUN 29 creatinine 1.19 CO2 29.  12/04/2014.  WBC 4.4 hemoglobin 11.8 platelets 197  11/11/2014.  Sodium 144 potassium 3.8 BUN 31 creatinine 1.09.  10/18/2014.  BNP 17.  WBC 5.1 hemoglobin 11.3 platelets 178        .       Assessment and plan  . Renal insufficiency-patient does not appear to respond well to increased doses of Lasix be on her baseline-her creatinine again has gone up to 1.8 in 1 Lasix is reduced her held quickly normalizes to her baseline at around 1.4.  At this  point we'll cautiously restart her Lasix at her previous dose 40 mg a day-also will restart her potassium at 10 mEq daily-Will recheck another metabolic panel tomorrow as well his next week to ensure stability- edema does not appear to be grossly changed-her weight has not gone up-clinically she appears to be stable again I suspect we will have to be somewhat conservative with her Lasix increases in the future  History of diastolic CHF again this appears to be relatively stable will monitor again this is a somewhat difficult balancing act with her history of renal insufficiency-although again her BNP 's in the past have been quite unremarkable  [id number]

## 2015-08-27 ENCOUNTER — Encounter (HOSPITAL_COMMUNITY)
Admission: AD | Admit: 2015-08-27 | Discharge: 2015-08-27 | Disposition: A | Discharge status: Home / Self Care | Payer: Medicare Other | Source: Skilled Nursing Facility | Attending: Internal Medicine | Admitting: Internal Medicine

## 2015-08-27 DIAGNOSIS — E875 Hyperkalemia: Secondary | ICD-10-CM | POA: Diagnosis not present

## 2015-08-27 DIAGNOSIS — I509 Heart failure, unspecified: Secondary | ICD-10-CM | POA: Diagnosis not present

## 2015-08-27 LAB — BASIC METABOLIC PANEL
ANION GAP: 7 (ref 5–15)
BUN: 31 mg/dL — AB (ref 6–20)
CALCIUM: 8.9 mg/dL (ref 8.9–10.3)
CO2: 27 mmol/L (ref 22–32)
Chloride: 107 mmol/L (ref 101–111)
Creatinine, Ser: 1.19 mg/dL — ABNORMAL HIGH (ref 0.44–1.00)
GFR calc Af Amer: 45 mL/min — ABNORMAL LOW (ref >60)
GFR, EST NON AFRICAN AMERICAN: 39 mL/min — AB (ref >60)
GLUCOSE: 98 mg/dL (ref 65–99)
POTASSIUM: 3.9 mmol/L (ref 3.5–5.1)
SODIUM: 141 mmol/L (ref 135–145)

## 2015-09-01 ENCOUNTER — Other Ambulatory Visit: Payer: Self-pay | Admitting: *Deleted

## 2015-09-01 MED ORDER — BENZONATATE 100 MG PO CAPS: 100.0000 mg | ORAL_CAPSULE | Freq: Two times a day (BID) | ORAL | Status: DC | PRN

## 2015-09-01 NOTE — Telephone Encounter (Signed)
[facility] 

## 2015-09-02 ENCOUNTER — Encounter (HOSPITAL_COMMUNITY)
Admission: AD | Admit: 2015-09-02 | Discharge: 2015-09-02 | Disposition: A | Discharge status: Home / Self Care | Payer: Medicare Other | Source: Skilled Nursing Facility | Attending: Internal Medicine | Admitting: Internal Medicine

## 2015-09-02 DIAGNOSIS — N189 Chronic kidney disease, unspecified: Secondary | ICD-10-CM | POA: Insufficient documentation

## 2015-09-02 LAB — BASIC METABOLIC PANEL
ANION GAP: 9 (ref 5–15)
BUN: 45 mg/dL — AB (ref 6–20)
CHLORIDE: 107 mmol/L (ref 101–111)
CO2: 28 mmol/L (ref 22–32)
Calcium: 9.4 mg/dL (ref 8.9–10.3)
Creatinine, Ser: 1.63 mg/dL — ABNORMAL HIGH (ref 0.44–1.00)
GFR, EST AFRICAN AMERICAN: 31 mL/min — AB (ref >60)
GFR, EST NON AFRICAN AMERICAN: 26 mL/min — AB (ref >60)
Glucose, Bld: 101 mg/dL — ABNORMAL HIGH (ref 65–99)
POTASSIUM: 4.2 mmol/L (ref 3.5–5.1)
SODIUM: 144 mmol/L (ref 135–145)

## 2015-09-04 ENCOUNTER — Non-Acute Institutional Stay (SKILLED_NURSING_FACILITY): Payer: Medicare Other | Admitting: Internal Medicine

## 2015-09-04 DIAGNOSIS — R609 Edema, unspecified: Secondary | ICD-10-CM | POA: Diagnosis not present

## 2015-09-04 DIAGNOSIS — N289 Disorder of kidney and ureter, unspecified: Secondary | ICD-10-CM | POA: Diagnosis not present

## 2015-09-04 NOTE — Progress Notes (Signed)
Patient ID: [name], female   DOB: 1921/11/28, 82 y.o.   MRN: [id number]               Acute visit.  LOS-skilled.  Facility [facility]   Chief complaint--renal insufficiency -     History of present illness .  Patient is a pleasant elderly resident with a history of chronic lower extremity edema venous stasis changes grade 1 diastolic CHF-this is complicated with a history of renal insufficiency    .   she has chronic lower extremity edema and fact in September  Doppler did show a minimal nonocclusive possibly acute DVT in the left distal femoral vein-she has been started on  Eliquis and appears to be tolerating this unremarkably.  She has a history of chronic lower extremity edema venous stasis changes- A couple weeks ago   nursing staff noted possibly some  increased edema  ---She does have a history of diastolic CHF however BNPs in the past have consistently been unremarkable  Review of weights show relative stability but edema did appear to be somewhat increased lower extremities-her Lasix was increased up to 60 mg total day.  However subsequent metabolic panel showed a creatinine was up to 1.81 with a BUN of 49-her baseline appears to be more in the mid ones and orders were to hold her Lasix as well as potassium.   Subsequent creatinine came down-j near her baseline and Lasix were restarted at 40 mg a day.  Since then it appears her weight has been relatively stable most recently 204.6  Updated lab done today shows her creatinine is 1.57 BUN is 78.  Clinically she appears stable and at baseline she does have periods of anxiety at times        .      .  . Family medical social history has been reviewed per history and physical on 02/07/2011  .  Medications reviewed per [facility] Aricept 10 mg daily.  Calcium, 500 mg 3 times a day.  Celexa 10 mg daily.  Claritin 10 mg daily.  DuoNeb nebulizers every 6 hours when necessary.  Lasix 40 mg daily.     Lotensin 40 mg daily.  Lovastatin 40 mg daily at bedtime.  Mylanta when necessary.  .  Norvasc 5 mg daily  . o Eliquistt 5 mg twice a day  Potassium 10 mEq daily.   Protonix 40 mg twice a day.  Restasis eyedrops twice a day.  Robitussin every 6 hours when necessary.  Simbrinza eyedrops 3 times a day.  Tessalon pearls 100 mg twice a day when necessary cough a statin eyedrops 1 drop each eye daily at bedtime.  Tylenol Extra Strength 500 mg 2 tabs twice a day. And once a day when necessary.  Ativan 0.25 mg every 6 hours when necessary anxiety    .  Review of systems.  In general denies any fever or chills. -Weight appears to be stable . Skin does not complain of rashes or itching  Has venous stasis changes Head ears eyes nose mouth and throat  Had some history of allergic rhinitis -does not complain of any visual changes  .  Respiratory. has some intermittent shortness of breath this has not really new does not complain of shortness of breath today act she says she feels better  Cardiac----- denies any chest pain or palpitations.currently--chronic lower extremity edema as noted above   -- s  Psych-does have a history of anxiety depression and mood disorder--this has been relatively  stable-- With occasional anxiety episodes .--I believe a psych consult is pending  .  Physical exam     T 98.0 pulse 76 respirations 20 blood pressures variable 157/65-137/64-139/65 most recently weight is stable at 204.6     .  In general this is a pleasant elderly female in no distress     Her skin is warm and dry. --Has chronic stasis changes of her lower legs bilaterally Approximately 2-3+ bilaterally--more so on the left versus the right this does not appear increased from previous exam  I  Eyes-pupils appear equal round reactive light --pu[pils appear reactive to light t   Oropharynx is clear --mucous membranes moist tongue is midline  Chest --CTA- somewhat shallow  air entry- no labored breathing.  Heart is regular rate and rhythm she has I would say --2--3-+ edema with venous stasis changes of her legs bilaterally -edema left is slightly more than the right again she does have a history of DVT .  Abdomen obese soft nontender with positive bowel sounds==.  Muscle skeletal moves all extremities x4 at baseline does ambulate in a wheelchair I do not note any deformities.  Neurologic-appears grossly intact  cranial nerves intact her speech is clear--  Psych  is oriented to self is conversant and cognizant although does have some confusion at times-does not really appear anxious today-    .  Labs  Jelly 60,017.  Sodium 134 potassium 4.5 BUN 78 creatinine 1.57.    08/25/2015.  Sodium 142 potassium 4.1 BUN 42 creatinine 1.4.  08/22/2015.  Sodium 142 potassium 4.4 BUN 49 creatinine 1.81.  WBC 6.3 hemoglobin 11.5 platelets 201  08/03/2015.  Sodium 142 potassium 3.8 BUN 32 creatinine 1.40.  07/15/2015.  BNP 21.     06/18/2015.  Sodium 143 potassium 3.9 BUN 45 creatinine 1.55.  WBC 5.0 hemoglobin 12.4 platelets 188  06/01/2015.  Sodium 139 potassium 4.1 BUN 46 creatinine 1.42.  WBC 4.9 hemoglobin 12.7 platelets 199.    04/10/2015.  TSH-1.454.  03/23/2015.  Sodium 142 potassium 4 BUN 44 creatinine 1.35 CO2 26.  02/18/2015.  BNP 15.0.  Liver function tests within normal limits except AST 14-ALT 12.  Cholesterol 232 triglyceride 71 HDL 112 LDL 106.  WBC 4.8 hemoglobin 12.3 platelets 174  03/02/2015.  Sodium 143 potassium 4 BUN 38 creatinine 1.27 CO2 30.  02/18/2015.  BNP 15.0.  Sodium 142 potassium 4.3 BUN 38 creatinine 1.45.  AST 14-ALT 12-albumin 4.0.  Cholesterol 232 triglyceride 71 HDL 112 LDL 106.  WBC 4.8 hemoglobin 12.3 platelets 174  02/05/2015.  Sodium 143 potassium 3.8 BUN 29 creatinine 1.19 CO2 29.  12/04/2014.  WBC 4.4 hemoglobin 11.8 platelets 197  11/11/2014.  Sodium 144 potassium  3.8 BUN 31 creatinine 1.09.  10/18/2014.  BNP 17.  WBC 5.1 hemoglobin 11.3 platelets 178        .       Assessment and plan  . Renal insufficiency-creatinine is at the higher end of her baseline at this point will continue Lasix 40 mg a day and recheck this next week clinically she appears to be stable   History of diastolic CHF again this appears to be relatively stable will monitor again this is a somewhat difficult balancing act with her history of renal insufficiency-although again her BNP 's in the past have been quite unremarkable--her weight appears to be stable  [id number]

## 2015-09-05 ENCOUNTER — Encounter: Payer: Self-pay | Admitting: Internal Medicine

## 2015-09-07 ENCOUNTER — Encounter (HOSPITAL_COMMUNITY)
Admission: RE | Admit: 2015-09-07 | Discharge: 2015-09-07 | Disposition: A | Discharge status: Home / Self Care | Payer: Medicare Other | Source: Skilled Nursing Facility | Attending: Internal Medicine | Admitting: Internal Medicine

## 2015-09-07 DIAGNOSIS — N189 Chronic kidney disease, unspecified: Secondary | ICD-10-CM | POA: Diagnosis not present

## 2015-09-07 LAB — BASIC METABOLIC PANEL
ANION GAP: 9 (ref 5–15)
BUN: 47 mg/dL — ABNORMAL HIGH (ref 6–20)
CHLORIDE: 107 mmol/L (ref 101–111)
CO2: 27 mmol/L (ref 22–32)
Calcium: 8.9 mg/dL (ref 8.9–10.3)
Creatinine, Ser: 1.53 mg/dL — ABNORMAL HIGH (ref 0.44–1.00)
GFR calc non Af Amer: 29 mL/min — ABNORMAL LOW (ref >60)
GFR, EST AFRICAN AMERICAN: 33 mL/min — AB (ref >60)
Glucose, Bld: 101 mg/dL — ABNORMAL HIGH (ref 65–99)
POTASSIUM: 4.2 mmol/L (ref 3.5–5.1)
SODIUM: 143 mmol/L (ref 135–145)

## 2015-09-10 DIAGNOSIS — F411 Generalized anxiety disorder: Secondary | ICD-10-CM | POA: Diagnosis not present

## 2015-09-10 DIAGNOSIS — F0390 Unspecified dementia without behavioral disturbance: Secondary | ICD-10-CM | POA: Diagnosis not present

## 2015-09-10 DIAGNOSIS — F329 Major depressive disorder, single episode, unspecified: Secondary | ICD-10-CM | POA: Diagnosis not present

## 2015-09-14 ENCOUNTER — Encounter (HOSPITAL_COMMUNITY)
Admission: RE | Admit: 2015-09-14 | Discharge: 2015-09-14 | Disposition: A | Discharge status: Home / Self Care | Payer: Medicare Other | Source: Skilled Nursing Facility | Attending: Internal Medicine | Admitting: Internal Medicine

## 2015-09-14 DIAGNOSIS — N189 Chronic kidney disease, unspecified: Secondary | ICD-10-CM | POA: Diagnosis not present

## 2015-09-14 LAB — BASIC METABOLIC PANEL
ANION GAP: 8 (ref 5–15)
BUN: 37 mg/dL — AB (ref 6–20)
CALCIUM: 9.1 mg/dL (ref 8.9–10.3)
CO2: 27 mmol/L (ref 22–32)
Chloride: 108 mmol/L (ref 101–111)
Creatinine, Ser: 1.38 mg/dL — ABNORMAL HIGH (ref 0.44–1.00)
GFR calc Af Amer: 37 mL/min — ABNORMAL LOW (ref >60)
GFR, EST NON AFRICAN AMERICAN: 32 mL/min — AB (ref >60)
Glucose, Bld: 106 mg/dL — ABNORMAL HIGH (ref 65–99)
POTASSIUM: 4.2 mmol/L (ref 3.5–5.1)
SODIUM: 143 mmol/L (ref 135–145)

## 2015-09-21 DIAGNOSIS — B351 Tinea unguium: Secondary | ICD-10-CM | POA: Diagnosis not present

## 2015-09-21 DIAGNOSIS — M79672 Pain in left foot: Secondary | ICD-10-CM | POA: Diagnosis not present

## 2015-09-21 DIAGNOSIS — M79671 Pain in right foot: Secondary | ICD-10-CM | POA: Diagnosis not present

## 2015-09-21 DIAGNOSIS — I739 Peripheral vascular disease, unspecified: Secondary | ICD-10-CM | POA: Diagnosis not present

## 2015-09-29 DIAGNOSIS — F0390 Unspecified dementia without behavioral disturbance: Secondary | ICD-10-CM | POA: Diagnosis not present

## 2015-09-29 DIAGNOSIS — F411 Generalized anxiety disorder: Secondary | ICD-10-CM | POA: Diagnosis not present

## 2015-09-29 DIAGNOSIS — F329 Major depressive disorder, single episode, unspecified: Secondary | ICD-10-CM | POA: Diagnosis not present

## 2015-10-07 ENCOUNTER — Encounter: Payer: Self-pay | Admitting: Internal Medicine

## 2015-10-07 ENCOUNTER — Non-Acute Institutional Stay (SKILLED_NURSING_FACILITY): Payer: Medicare Other | Admitting: Internal Medicine

## 2015-10-07 DIAGNOSIS — F0391 Unspecified dementia with behavioral disturbance: Secondary | ICD-10-CM

## 2015-10-07 DIAGNOSIS — I10 Essential (primary) hypertension: Secondary | ICD-10-CM | POA: Diagnosis not present

## 2015-10-07 DIAGNOSIS — F03918 Unspecified dementia, unspecified severity, with other behavioral disturbance: Secondary | ICD-10-CM

## 2015-10-07 DIAGNOSIS — N289 Disorder of kidney and ureter, unspecified: Secondary | ICD-10-CM | POA: Diagnosis not present

## 2015-10-07 DIAGNOSIS — R609 Edema, unspecified: Secondary | ICD-10-CM

## 2015-10-07 NOTE — Progress Notes (Signed)
Patient ID: [name], female   DOB: 1922/06/27, 82 y.o.   MRN: [id number]              Routine visit.  LOC-skilled.  Facility [facility]   Chief complaint-medical management of dementia renal insufficiency dysphagia hyperlipidemia depression- GERD--history left leg DVT Acute visit secondary to question increased edema-     History of present illness .  Patient is a pleasant elderly resident with the above diagnoses  At times she will develop increased cough she usually responds to a course of Mucinex-chest ray has not really shown any acute process recently .   she has chronic lower extremity edema and fact in September  Doppler did show a minimal nonocclusive possibly acute DVT in the left distal femoral vein-she has been started on  Eliquis and appears to be tolerating this unremarkably.  She has a history of chronic lower extremity edema venous stasis changes-nursing staff has noted  -possibly some slightly increased edema  ---She does have a history of diastolic CHF however BNPs in the past have consistently been unremarkable Review of weights today this appears to be relatively stable most recently 203.8- December 12 was 203.6-December 5 was 204.6    She does have a history of renal insufficiency creatinine most recently creatinine was 1.38 with BUN of 37 on Jan 16 which shows stability        Her vital signs appear to be stable  although at times her blood pressure does vary -most recent readings 150/73-126/61-100/67-again there continues to be quite a bit of variability here-there is some variation but I do not see consistent elevations which has been the case for some time here -she is on Norvasc 5 mg a day as well as Lotensin 40 mg a day--I suspect some of the blood pressure variation is due to the size of the cuff Larger cuff usually gives more accurate readings which are lower     She does have a history of GERD is now on protonix twice a  day---.   staes she has trouble swallowing at times----- in the past-she has had a GI consult-they ordered a CT scan of the abdomen which did not show any acute process and  showed colonic diverticulosis without evidence of acute diverticulitis-also a fibroid uterus In 2012 she did have an EGD which basically showed age-related esophageal motility decline but no acute issue Per nursing staff she is not complaining of dysphagia recently     Regards to her other issues--  she has a history of depressive symptoms-she is on Celexa.--Also was on Depakote as a mood stabilizer which appears to help  Her main concern today is his lower extremity edema which appears to wax and wane-I suspect some of this is dependent related and when patient has her legs in a dependent position they tend to get bigger At times  will increase her Lasix for short period and this appears to help some.  .      .  . Family medical social history has been reviewed per history and physical on 02/07/2011  .  Medications reviewed per [facility] Aricept 10 mg daily.  Calcium, 500 mg 3 times a day.  Celexa 10 mg daily.  Claritin 10 mg daily.  DuoNeb nebulizers every 6 hours when necessary.  Lasix 40 mg daily.  Lotensin 40 mg daily.  Lovastatin 40 mg daily at bedtime.  Mylanta when necessary.  .  Norvasc 5 mg daily  . o Eliquistt 5 mg  twice a day  Potassium 10 mEq daily.  Protonix 40 mg twice a day.  Restasis eyedrops twice a day.  Robitussin every 6 hours when necessary.  Simbrinza eyedrops 3 times a day.  Tessalon pearls 100 mg twice a day when necessary cough a statin eyedrops 1 drop each eye daily at bedtime.  Tylenol Extra Strength 500 mg 2 tabs twice a day. And once a day when necessary.  Ativan 0.25 mg every 6 hours when necessary anxiety    .  Review of systems.  In general denies any fever or chills. -Weight appears to be stable . Skin does not complain of rashes or itching  Has  venous stasis changes Head ears eyes nose mouth and throat  Had some history of allergic rhinitis -does not complain of any visual changes  .  Respiratory. has some intermittent shortness of breath this has not really new  Cardiac----- denies any chest pain or palpitations.currently--chronic lower extremity edema--per nursing and patient they believe this is slightly increased recently  GI-complains of occasional abdominal discomfort-this is chronic again and has been evaluated by GI does not complaining of acute abdominal discomfort  -- -  Musculoskeletal-does have a history of back pain and osteoarthritis but this is controlled  on Tylenol.  Neurologic-does not complaining of headache or dizziness. Or syncopal-type feelings  Psych-does have a history of anxiety depression and mood disorder--this has been relatively stable-- With occasional anxiety episodes .  Marland Kitchen  Physical exam    Temperature 98.0 pulse 67 respirations 20 blood pressures as noted above weight is stable at 203.8     .  In general this is a pleasant elderly female in no distress     Her skin is warm and dry. --Has chronic stasis changes of her lower legs bilaterally Approximately 2-3+ bilaterally--this may be slightly increased although this could be dependent related  I  Eyes-pupils appear equal round reactive light --pu[pils appear reactive to light t   Oropharynx is clear --mucous membranes moist tongue is midline  Chest --CTA- somewhat shallow air entry- no labored breathing.  Heart is regular rate and rhythm she has I would say --2--3-+ edema with venous stasis changes of her legs bilaterally -edema left is slightly more than the right again she does have a history of DVT .  Abdomen obese soft nontender with positive bowel sounds==.  Muscle skeletal moves all extremities x4 at baseline does ambulate in a wheelchair I do not note any deformities. Or active joints at this time she does have some pain with any  type of palpation of her legs but this is unchanged from baseline Neurologic-appears grossly intact I do not see a focal weaknesses or lateralizing findings strength appears to be intact on 4 extremities cranial nerves intact her speech is clear--touch sensation of her lower extremities and feet appears to be intact --has tenderness when her legs are palpated but this is not new Psych  is oriented to self is conversant and cognizant although does have some confusion at times-- is somewhat anxious.--  .  La  09/14/2015.  Sodium 143 potassium 4.2 BUN 37 creatinine 1.38.  Baseline creatinine ranges from 1.19-1.63.  08/22/2015.  WBC 6.3 hemoglobin 11.5 platelets 201.  bs  08/03/2015.  Sodium 142 potassium 3.8 BUN 32 creatinine 1.40.  07/15/2015.  BNP 21.     06/18/2015.  Sodium 143 potassium 3.9 BUN 45 creatinine 1.55.  WBC 5.0 hemoglobin 12.4 platelets 188  06/01/2015.  Sodium 139 potassium 4.1 BUN  46 creatinine 1.42.  WBC 4.9 hemoglobin 12.7 platelets 199.    04/10/2015.  TSH-1.454.  03/23/2015.  Sodium 142 potassium 4 BUN 44 creatinine 1.35 CO2 26.  02/18/2015.  BNP 15.0.  Liver function tests within normal limits except AST 14-ALT 12.  Cholesterol 232 triglyceride 71 HDL 112 LDL 106.  WBC 4.8 hemoglobin 12.3 platelets 174  03/02/2015.  Sodium 143 potassium 4 BUN 38 creatinine 1.27 CO2 30.  02/18/2015.  BNP 15.0.  Sodium 142 potassium 4.3 BUN 38 creatinine 1.45.  AST 14-ALT 12-albumin 4.0.  Cholesterol 232 triglyceride 71 HDL 112 LDL 106.  WBC 4.8 hemoglobin 12.3 platelets 174  02/05/2015.  Sodium 143 potassium 3.8 BUN 29 creatinine 1.19 CO2 29.  12/04/2014.  WBC 4.4 hemoglobin 11.8 platelets 197  11/11/2014.  Sodium 144 potassium 3.8 BUN 31 creatinine 1.09.  10/18/2014.  BNP 17.  WBC 5.1 hemoglobin 11.3 platelets 178      .       Assessment and plan  .  Number 1--HTN--- currently on Lotensin and Norvasc - At  this point appears stablel occasionally are some systolic elevations but these are not consistent  Continues on Lotensin 40 mg a day as well as Lasix 40 mg a day and Norvasc 5 mg a day ...  -  x  #2-dementia-this appears to be relatively mild she continues on Aricept with Depakote as a mood stabilizer.-     #3 CHF--edema---  Currently on Lasix 40-milligrams a day- As well as potassium 10 mEq day-w Possibly slightly increased today-this is a challenging balancing situation with her history of renal insufficiency will increase her Lasix to 40 mg in the morning 20 mg later in the day  for 2 days- seems with her Lasix consistently 60 mg a day her creatinine tends to go up so we'll do this for short duration to see get some improvement in the edema also be largely dependent related she will have to be encouraged to elevate her legs as much as possible-her weight actually appears to be stable-not be real aggressive with increasing the Lasix for any duration of time since clinically she appears stable and has stable weight- Will also update a CMP to keep an eye on her renal function and electrolytes she is on potassium supplementation- .  Leg elevation also will have to be encouraged      #4-dysphagia-GERD--continues on Protonix twice a day-this has been followed by GI-CT scan of the abdomen again appear to be relatively benign-  She is not really complaining of dysphasia today-although she will complain of this intermittently .  #5 renal insufficiency-this appears to be relatively stable we'll update Metabolic panel.-   #6 hyperlipidemia-t On Mevacor-- t--of note she has consistently high HDLs--and mildly elevated alkaline phosphatase-recent liver function tests however were within normal limits -- HDL is quite high at 112--LDL 106-with patient's advanced age will not be real aggressive with this-- will update her liver function tests .  #7-depression this appears stable on Celexa--she does  have periods of anxiety and she is followed by the psychiatric nurse practitioner her Ativan dose was changed to 0.25 mg twice a day routine she previously had been on this routinely in the morning and then had when necessary doseslater in the day .  #8-anemia- suspect there is an element of chronic disease here - Hbg-11.5 on lab done back in December will update this .  #9-history of allergic rhinitis this has been asymptomatic for a while.--Claritin appears to be effective she  is also on Flonase   10-gout-this is been asymptomatic for a while continue to monitor--she continues to have a sensitivity - when her lower extremities are palpated but this does not appear to be any change from her baseline-did not appreciate any active joints today  #11  history of left leg DVT   This appears to be stable and fairly minimal she is on Eliquistfor anticoagulation--still has some slightly increased edema on the left versus the right this does not appear to be overtly dramatic  [id number]--of note greater than 35 minutes spent assessing patient-discussing her concerns at bedside-reviewing her chart and discussing her status with nursing staff -and coordinating and formulating a plan of care-of note greater than 50% of time spent coordinating plan of carefor numerous diagnoses

## 2015-10-08 ENCOUNTER — Encounter (HOSPITAL_COMMUNITY)
Admission: AD | Admit: 2015-10-08 | Discharge: 2015-10-08 | Disposition: A | Discharge status: Home / Self Care | Payer: Medicare Other | Source: Skilled Nursing Facility | Attending: Internal Medicine | Admitting: Internal Medicine

## 2015-10-08 DIAGNOSIS — N183 Chronic kidney disease, stage 3 (moderate): Secondary | ICD-10-CM | POA: Insufficient documentation

## 2015-10-08 LAB — CBC WITH DIFFERENTIAL/PLATELET
BASOS ABS: 0 10*3/uL (ref 0.0–0.1)
Basophils Relative: 0 %
EOS ABS: 0.1 10*3/uL (ref 0.0–0.7)
EOS PCT: 2 %
HCT: 36.8 % (ref 36.0–46.0)
Hemoglobin: 11.8 g/dL — ABNORMAL LOW (ref 12.0–15.0)
Lymphocytes Relative: 30 %
Lymphs Abs: 2 10*3/uL (ref 0.7–4.0)
MCH: 28.2 pg (ref 26.0–34.0)
MCHC: 32.1 g/dL (ref 30.0–36.0)
MCV: 88 fL (ref 78.0–100.0)
MONO ABS: 0.6 10*3/uL (ref 0.1–1.0)
Monocytes Relative: 8 %
Neutro Abs: 3.9 10*3/uL (ref 1.7–7.7)
Neutrophils Relative %: 60 %
PLATELETS: 208 10*3/uL (ref 150–400)
RBC: 4.18 MIL/uL (ref 3.87–5.11)
RDW: 14.8 % (ref 11.5–15.5)
WBC: 6.5 10*3/uL (ref 4.0–10.5)

## 2015-10-08 LAB — COMPREHENSIVE METABOLIC PANEL
ALT: 12 U/L — ABNORMAL LOW (ref 14–54)
ANION GAP: 10 (ref 5–15)
AST: 16 U/L (ref 15–41)
Albumin: 3.9 g/dL (ref 3.5–5.0)
Alkaline Phosphatase: 112 U/L (ref 38–126)
BUN: 31 mg/dL — ABNORMAL HIGH (ref 6–20)
CHLORIDE: 106 mmol/L (ref 101–111)
CO2: 26 mmol/L (ref 22–32)
CREATININE: 1.44 mg/dL — AB (ref 0.44–1.00)
Calcium: 9 mg/dL (ref 8.9–10.3)
GFR, EST AFRICAN AMERICAN: 36 mL/min — AB (ref >60)
GFR, EST NON AFRICAN AMERICAN: 31 mL/min — AB (ref >60)
Glucose, Bld: 102 mg/dL — ABNORMAL HIGH (ref 65–99)
POTASSIUM: 4 mmol/L (ref 3.5–5.1)
SODIUM: 142 mmol/L (ref 135–145)
Total Bilirubin: 0.6 mg/dL (ref 0.3–1.2)
Total Protein: 7.5 g/dL (ref 6.5–8.1)

## 2015-10-16 ENCOUNTER — Ambulatory Visit (HOSPITAL_COMMUNITY): Payer: Medicare Other | Attending: Internal Medicine

## 2015-10-16 DIAGNOSIS — S60051A Contusion of right little finger without damage to nail, initial encounter: Secondary | ICD-10-CM | POA: Insufficient documentation

## 2015-10-16 DIAGNOSIS — S6991XA Unspecified injury of right wrist, hand and finger(s), initial encounter: Secondary | ICD-10-CM | POA: Diagnosis not present

## 2015-10-16 DIAGNOSIS — R600 Localized edema: Secondary | ICD-10-CM | POA: Diagnosis not present

## 2015-10-16 DIAGNOSIS — M7989 Other specified soft tissue disorders: Secondary | ICD-10-CM | POA: Diagnosis not present

## 2015-10-16 DIAGNOSIS — W050XXA Fall from non-moving wheelchair, initial encounter: Secondary | ICD-10-CM | POA: Insufficient documentation

## 2015-10-16 DIAGNOSIS — T148 Other injury of unspecified body region: Secondary | ICD-10-CM | POA: Diagnosis present

## 2015-11-10 ENCOUNTER — Non-Acute Institutional Stay (SKILLED_NURSING_FACILITY): Payer: Medicare Other | Admitting: Internal Medicine

## 2015-11-10 ENCOUNTER — Encounter: Payer: Self-pay | Admitting: Internal Medicine

## 2015-11-10 DIAGNOSIS — N289 Disorder of kidney and ureter, unspecified: Secondary | ICD-10-CM | POA: Diagnosis not present

## 2015-11-10 DIAGNOSIS — J3089 Other allergic rhinitis: Secondary | ICD-10-CM

## 2015-11-10 DIAGNOSIS — R609 Edema, unspecified: Secondary | ICD-10-CM | POA: Diagnosis not present

## 2015-11-10 DIAGNOSIS — I10 Essential (primary) hypertension: Secondary | ICD-10-CM | POA: Diagnosis not present

## 2015-11-10 DIAGNOSIS — I82409 Acute embolism and thrombosis of unspecified deep veins of unspecified lower extremity: Secondary | ICD-10-CM

## 2015-11-10 NOTE — Progress Notes (Signed)
Patient ID: [name], female   DOB: [birth year], 82 y.o.   MRN: [id number]  Location:  [facility]   Place of Service:  SNF (31)  Cyndee Brightly, MD  Patient Care Team: Ricard Dillon, MD as PCP - General (Internal Medicine)  Extended Emergency Contact Information Primary Emergency Contact: [name], [place] [address] [name] of [place] Phone: [phone] Relation: Daughter Secondary Emergency Contact: [name] States of [place] Phone: [phone] Relation: Son  Code Status:  Goals of care: Advanced Directive information Advanced Directives 08/04/2011  Does patient have an advance directive? Patient does not have advance directive;Patient would not like information  Pre-existing out of facility DNR order (yellow form or pink MOST form) -     Chief Complaint  Patient presents with  . Acute Visit  Secondary to concerns about leg edema.  Medical management of chronic medical issues including hypertension-dementia-CHF-GERD-depression-renal insufficiency history left leg DVT  HPI:  Patient is a pleasant 82 year old female with the above diagnoses-she has been fairly stable recently he does have some chronic lower extremity edema venous stasis changes which actually appears increased at times and she receives is somewhat increased dose of Lasix which is effective.  He does have a history of chronic diastolic CHF but her BNP is in the past consistently of been unremarkable.  Her weight appears to be stable at 203.8.  She does have a history of renal insufficiency creatinine most recently has been relatively stable at 1.44 BUN of 31 on lab done on February.  Vital signs appear to be stable at times her blood pressure does vary but there is not consistent CSE recent blood pressures ranging from systolics of [id number] down to [id number]-  In the past she has complained of difficulty swallowing she had a GI consult he ordered  a CT scan of the abdomen did not show any acute process did show colonic diverticulosis without evidence of acute diverticulitis also a fibroid uterus.  Back in 2012 she did have an EGD basically showed age-related esophageal motility decline in no acute issues however.  In regards to depression she is on Celexa as well as Depakote as a mood stabilizer this appears stable although at times she has somewhat increased anxiety.  Her most consistent complain appears to be lower extremity edema leg elevation is being encouraged although apparently there is some issue with compliance here at times.          Past Medical History  Diagnosis Date  . Cellulitis   . Dementia   . Anxiety   . Osteoarthritis   . Hyperlipidemia   . Hypertension   . Syncope   . Muscle weakness   . Gait abnormality   . GERD (gastroesophageal reflux disease)   . Gout   . Myocardial infarction ([place]) 20 yrs ago    [place]  . CHF (congestive heart failure) ([place])   . Chronic kidney disease   . Glaucoma   . Glaucoma   . Dysphagia    Past Surgical History  Procedure Laterality Date  . Appendectomy    . Tonsillectomy    . Cataract extraction w/phaco  08/04/2011    Procedure: CATARACT EXTRACTION PHACO AND INTRAOCULAR LENS PLACEMENT (IOC);  Surgeon: Tonny Branch;  Location: [facility];  Service: Ophthalmology;  Laterality: Right;  CDE=18.53  . Cataract extraction w/phaco  08/25/2011    Procedure: CATARACT EXTRACTION PHACO AND INTRAOCULAR LENS PLACEMENT (IOC);  Surgeon: Tonny Branch;  Location: [facility];  Service: Ophthalmology;  Laterality: Left;  CDE:14.76    Allergies  Allergen Reactions  . Bee Venom Anaphylaxis  . Aspirin     Directed by physician  . Cabbage Other (See Comments)    Certain foods due to gout flares  . Latex Rash   Medications.  Aricept 10 mg daily.  Calcium 500 mg 3 times a day.  Celexa 10 mg daily.  Claritin 10 mg daily.  DuoNeb nebulizers every 6 hours when necessary.  Lasix 40 mg  daily.  Lotensin 40 mg daily.  Lovastatin 40 mg daily daily at bedtime.  Mylanta when necessary.  Norvasc 5 mg daily.  Eliquis 5 mg twice a day.  Potassium 10 mEq daily.  Protonix 40 mg twice a day.  Restasis eyedrops twice a day.  Robitussin every 6 hours when necessary.  Tessalon pearls 100 mg twice a day when necessary.  Tylenol Extra Strength 500 mg after 2-twice a day.  Ativan 0.25 mg every 6 hours when necessary  Family medical social history reviewed per H&P on 02/07/2011.     Review of Systems    General no complaints of fever chills weight appears to be relatively stable.  Skin does not complain of rashes or itching.  Head ears eyes nose mouth and throat does not complain of any visual changes or sore throat she does have some history of allergic rhinitis.  Respiratory is not complaining of any increased shortness of breath beyond baseline does not complain of cough.  Cardiac does not complain of chest pain or palpitations chronic lower extremity edema-question increased.  GI does not complain of abdominal pain nausea vomiting diarrhea constipation.  Muscle skeletal does have a history of back pain and osteoarthritis but this appears to be fairly well controlled on Tylenol.  Neurologic does not complain of dizziness headache or numbness.  Psych history of anxiety and depression and mood disorder this is been relatively stable today she does have increased anxiety.    Immunization History  Administered Date(s) Administered  . Influenza Whole 06/11/2007, 10/16/2008, 05/21/2009, 05/05/2010  . Influenza-Unspecified 06/05/2014  . Pneumococcal Polysaccharide-23 06/11/2007  . Td 06/11/2007  . Zoster 05/21/2007   Pertinent  Health Maintenance Due  Topic Date Due  . PNA vac Low Risk Adult (2 of 2 - PCV13) 06/10/2008  . INFLUENZA VACCINE  03/30/2015  . DEXA SCAN  Completed   No flowsheet data found. Functional Status Survey:    Filed Vitals:    11/10/15 1602  BP: 159/49  Pulse: 71  Temp: 98.9 F (37.2 C)  TempSrc: Oral  Resp: 20  SpO2: 92%  Of note other recent blood pressures 118/57-128/57-144/69 weight appears to be stable at 203.8  Physical Exam   In general is a pleasant LE female in no distress.  Her skin is warm and dry.  She does have venous stasis changes left greater than right lower extremities.  Eyes pupils appear equal round reactive light visual acuity appears grossly intact.  Oropharynx is clear mucous membranes moist.  Chest generally clear to auscultation there is a minimal amount of expiratory wheezing upper anterior lung fields this is baseline however no labored breathing.  Heart is regular rate and rhythm without murmur gallop or rub she has baseline lower extremity venous stasis changes possibly 2-3+ left somewhat greater than right which is not new.  Abdomen is obese soft nontender positive bowel sounds.  Muscle skeletal moves all extremities at baseline is ambulating in  a wheelchair-do not note any acute deformities she does have arthritic changes continues have some pain with palpitation of her legs but this is not new.  Neurologic is grossly intact her speech is clear I don't see any focal weaknesses cranial nerves are grossly intact.  Psych she is oriented to self she is pleasant conversant incontinence and with some confusion at times I suspect his dementia is fairly mild.   Labs reviewed:  Recent Labs  09/07/15 0730 09/14/15 0600 10/08/15 0705  NA 143 143 142  K 4.2 4.2 4.0  CL 107 108 106  CO2 27 27 26   GLUCOSE 101* 106* 102*  BUN 47* 37* 31*  CREATININE 1.53* 1.38* 1.44*  CALCIUM 8.9 9.1 9.0    Recent Labs  02/18/15 0750 05/19/15 1945 10/08/15 0705  AST 14* 17 16  ALT 12* 13* 12*  ALKPHOS 116 130* 112  BILITOT 0.6 0.4 0.6  PROT 7.3 7.5 7.5  ALBUMIN 4.0 4.1 3.9    Recent Labs  06/18/15 0720 08/22/15 1710 10/08/15 0705  WBC 5.0 6.3 6.5  NEUTROABS 2.5 3.2 3.9    HGB 12.4 11.5* 11.8*  HCT 38.2 36.2 36.8  MCV 90.7 90.7 88.0  PLT 188 201 208   Lab Results  Component Value Date   TSH 1.454 04/10/2015   No results found for: HGBA1C Lab Results  Component Value Date   CHOL 232* 02/18/2015   HDL 112 02/18/2015   LDLCALC 106* 02/18/2015   TRIG 71 02/18/2015   CHOLHDL 2.1 02/18/2015    Significant Diagnostic Results in last 30 days:  Dg Hand Complete Right  10/16/2015  CLINICAL DATA:  Status post falling from wheelchair striking the right hand; bruising over the fifth finger and fifth metacarpophalangeal joint. EXAM: RIGHT HAND - COMPLETE 3+ VIEW COMPARISON:  None in PACs FINDINGS: The bones are osteopenic. There is no acute fracture nor dislocation. There are mild osteoarthritic changes of the interphalangeal joints. The MCP joints are reasonably well-maintained. There is mild degenerative change of the first carpometacarpal joint. The carpal bones are intact where visualized. IMPRESSION: There is no acute fracture of the right hand. There is mild soft tissue swelling over the fifth metacarpophalangeal joint. There mild osteoarthritic changes of the first [facility] joint and of the interphalangeal joints. Electronically Signed   By: David  Martinique M.D.   On: 10/16/2015 13:04    Assessment/Plan T  #1 hypertension-this appears relatively stable on Lotensin and Norvasc as noted above she is on Lotensin 40 mg today are S5 milligrams a day and Lasix currently 40 mg a day.  #2 dementia appears to be at baseline relatively mild she is on Aricept with Depakote as a mood stabilizer.  History of CHF edema-edema and weight appears to be stable at this point will continue current dose of Lasix-and encourage leg elevation for any possible increased edema although this does not appear to be certainly greatly increased from what her baseline is at this point mainly encourage leg elevation and monitor-also will update a metabolic panel to make sure renal function  electrolytes remained stable.  #4 history renal insufficiency as stated above she does have some chronic renal insufficiency creatinine of 1.44 on lab done in February appears relatively baseline Will update this.  #5 history of dysphagia this appears stable on proton aches she has been followed by GI in the past.  #6 hyperlipidemia she is on Mevacor this appears to be looking quite well-she has a history of consistently high HDLs-with an  HDL 112 LDL 106-with patient's of Vancenase when I be real aggressive with this-recent liver function tests remain within normal limits  #7 depression this appears stable on Celexa her periods of anxiety appear to be fairly well controlled and not consistent-she is followed by the psychiatric nurse practitioner and she is on Ativan 0.25 twice a day.  #8 anemia suspect there is an element of chronic disease here hemoglobin 11.8 on lab done in February this appears relatively baseline Will update this.  #9 history of allergic rhinitis this appears stable she is on Claritin.  #10 doubt this is been a cement 8 for quite a while I suspect her leg pain is quite chronic this does not appear to be a gout presentation.  #11 history of left leg DVT this is stable fairly minimal she is on Eliquis-tinged have some slightly increased edema on that leg versus her right.  [id number] note greater than 35 minutes spent assessing patient-discussing her concerns at bedside-discussing her status with nursing staff-and coordinating formulating a plan of care for numerous diagnoses-no greater than 50% of time spent coordinating plan of care with nursing  And npatient input

## 2015-11-11 ENCOUNTER — Encounter (HOSPITAL_COMMUNITY)
Admission: RE | Admit: 2015-11-11 | Discharge: 2015-11-11 | Disposition: A | Discharge status: Home / Self Care | Payer: Medicare Other | Source: Skilled Nursing Facility | Attending: Internal Medicine | Admitting: Internal Medicine

## 2015-11-11 DIAGNOSIS — F339 Major depressive disorder, recurrent, unspecified: Secondary | ICD-10-CM | POA: Diagnosis not present

## 2015-11-11 DIAGNOSIS — K210 Gastro-esophageal reflux disease with esophagitis: Secondary | ICD-10-CM | POA: Insufficient documentation

## 2015-11-11 DIAGNOSIS — I509 Heart failure, unspecified: Secondary | ICD-10-CM | POA: Diagnosis not present

## 2015-11-11 DIAGNOSIS — R609 Edema, unspecified: Secondary | ICD-10-CM | POA: Diagnosis not present

## 2015-11-11 DIAGNOSIS — F419 Anxiety disorder, unspecified: Secondary | ICD-10-CM | POA: Diagnosis not present

## 2015-11-11 LAB — CBC WITH DIFFERENTIAL/PLATELET
BASOS ABS: 0 10*3/uL (ref 0.0–0.1)
Basophils Relative: 0 %
Eosinophils Absolute: 0.1 10*3/uL (ref 0.0–0.7)
Eosinophils Relative: 2 %
HEMATOCRIT: 38.9 % (ref 36.0–46.0)
Hemoglobin: 12.2 g/dL (ref 12.0–15.0)
LYMPHS PCT: 35 %
Lymphs Abs: 2.1 10*3/uL (ref 0.7–4.0)
MCH: 27.4 pg (ref 26.0–34.0)
MCHC: 31.4 g/dL (ref 30.0–36.0)
MCV: 87.4 fL (ref 78.0–100.0)
Monocytes Absolute: 0.5 10*3/uL (ref 0.1–1.0)
Monocytes Relative: 8 %
NEUTROS ABS: 3.2 10*3/uL (ref 1.7–7.7)
Neutrophils Relative %: 55 %
Platelets: 202 10*3/uL (ref 150–400)
RBC: 4.45 MIL/uL (ref 3.87–5.11)
RDW: 15.1 % (ref 11.5–15.5)
WBC: 5.8 10*3/uL (ref 4.0–10.5)

## 2015-11-11 LAB — BASIC METABOLIC PANEL
ANION GAP: 9 (ref 5–15)
BUN: 38 mg/dL — ABNORMAL HIGH (ref 6–20)
CO2: 28 mmol/L (ref 22–32)
Calcium: 9 mg/dL (ref 8.9–10.3)
Chloride: 107 mmol/L (ref 101–111)
Creatinine, Ser: 1.33 mg/dL — ABNORMAL HIGH (ref 0.44–1.00)
GFR calc Af Amer: 39 mL/min — ABNORMAL LOW (ref >60)
GFR, EST NON AFRICAN AMERICAN: 34 mL/min — AB (ref >60)
GLUCOSE: 95 mg/dL (ref 65–99)
POTASSIUM: 3.9 mmol/L (ref 3.5–5.1)
Sodium: 144 mmol/L (ref 135–145)

## 2015-12-03 ENCOUNTER — Encounter: Payer: Self-pay | Admitting: Internal Medicine

## 2015-12-03 ENCOUNTER — Non-Acute Institutional Stay (SKILLED_NURSING_FACILITY): Payer: Medicare Other | Admitting: Internal Medicine

## 2015-12-03 DIAGNOSIS — R609 Edema, unspecified: Secondary | ICD-10-CM | POA: Diagnosis not present

## 2015-12-03 DIAGNOSIS — M109 Gout, unspecified: Secondary | ICD-10-CM | POA: Diagnosis not present

## 2015-12-03 DIAGNOSIS — N289 Disorder of kidney and ureter, unspecified: Secondary | ICD-10-CM | POA: Diagnosis not present

## 2015-12-03 DIAGNOSIS — I10 Essential (primary) hypertension: Secondary | ICD-10-CM | POA: Diagnosis not present

## 2015-12-03 NOTE — Assessment & Plan Note (Signed)
Labs reviewed; renal function stable to improved

## 2015-12-03 NOTE — Assessment & Plan Note (Signed)
Avoid HCTZ 

## 2015-12-03 NOTE — Assessment & Plan Note (Deleted)
Avoid increase in her amlodipine due to edema.  Avoid HCTZ because of her renal insufficiency and gout. Hydralazine 10 mg 3 times a day. Adjustment in medications will be determined by blood pressure average over 2 weeks area; goal will be less than 140/90

## 2015-12-03 NOTE — Assessment & Plan Note (Signed)
Raising amlodipine dose is not an option If blood pressure control is achieved; amlodipine should be weaned as a trial

## 2015-12-03 NOTE — Progress Notes (Signed)
   This is a nursing facility follow up for specific acute issue .  HPI: Pharmacy reports that her blood pressures have ranged from 166/80-173/67. Pulses range from 95-100. The patient is on furosemide 40 mg daily, Lotensin 40 mg daily amlodipine 5 mg daily. In reviewing the vital signs flowsheets; blood pressures have been as low as 102/50. She has mild renal insufficiency with a creatinine of 1.33, BUN 38, and GFR 39 .Her renal function is actually stable to improved.  ROS: Review of systems generally positive. He has nonspecific headaches which she relates to the noise from her roommates oxygen machine She describes occasional chest pain and palpitations. She has shortness of breath as well as paroxysmal nocturnal dyspnea. She has some lightheadedness when she stands up. She has chronic edema lower extremities.  She also has the "gouch".   Pertinent or positive findings: She is wheelchair-bound She appears much younger than her stated age area and she has arcus senilis. She is completely edentulous. She has a grade 0000000 systolic murmur at the base. Abdomen is protuberant. She has 2+ edema of the lower extremities from the knees down. This is nonpitting. Pedal pulses are decreased. General appearance:Adequately nourished; no acute distress or increased work of breathing is present.   Lymphatic: No  lymphadenopathy about the head, neck, or axilla . Eyes: No conjunctival inflammation or lid edema is present. There is no scleral icterus. Ears:  External ear exam shows no significant lesions or deformities.   Nose:  External nasal examination shows no deformity or inflammation. Nasal mucosa are pink and moist without lesions or exudates No septal dislocation or deviation.No obstruction to airflow.  Oral exam: lips and gums are healthy appearing.There is no oropharyngeal erythema or exudate . Neck:  No deformities, thyromegaly, masses, or tenderness noted.  Heart:  Normal rate and regular  rhythm. S1 and S2 normal without gallop,  click, rub or other extra sounds.  Lungs:Chest clear to auscultation; no wheezes, rhonchi,rales ,or rubs present. Abdomen:Bowel sounds are normal. Abdomen is soft and nontender with no organomegaly, hernias  or masses. GU: deferred as previously addressed. Extremities:  No cyanosis or clubbing noted. Skin: Warm & dry w/o tenting . No significant lesions or rash.  #1 HTN #2 CRI #3 hx of gout #4 edema See plan

## 2015-12-03 NOTE — Patient Instructions (Signed)
See summary under each active problem in the Problem List with associated updated therapeutic plan. Completed document transmitted to [facility]. 

## 2015-12-03 NOTE — Assessment & Plan Note (Signed)
Avoid increase in her amlodipine due to edema.  Avoid HCTZ because of her renal insufficiency and gout. Hydralazine 10 mg 3 times a day. Adjustment in medications will be determined by blood pressure average over 2 weeks area; goal will be less than 140/90

## 2015-12-29 DIAGNOSIS — Z7901 Long term (current) use of anticoagulants: Secondary | ICD-10-CM | POA: Diagnosis not present

## 2015-12-29 DIAGNOSIS — H04123 Dry eye syndrome of bilateral lacrimal glands: Secondary | ICD-10-CM | POA: Diagnosis not present

## 2015-12-29 DIAGNOSIS — Z961 Presence of intraocular lens: Secondary | ICD-10-CM | POA: Diagnosis not present

## 2015-12-29 DIAGNOSIS — H401134 Primary open-angle glaucoma, bilateral, indeterminate stage: Secondary | ICD-10-CM | POA: Diagnosis not present

## 2015-12-31 ENCOUNTER — Encounter (HOSPITAL_COMMUNITY)
Admission: RE | Admit: 2015-12-31 | Discharge: 2015-12-31 | Disposition: A | Discharge status: Home / Self Care | Payer: Medicare Other | Source: Skilled Nursing Facility | Attending: Internal Medicine | Admitting: Internal Medicine

## 2015-12-31 ENCOUNTER — Non-Acute Institutional Stay (SKILLED_NURSING_FACILITY): Payer: Medicare Other | Admitting: Internal Medicine

## 2015-12-31 ENCOUNTER — Encounter: Payer: Self-pay | Admitting: Internal Medicine

## 2015-12-31 DIAGNOSIS — R739 Hyperglycemia, unspecified: Secondary | ICD-10-CM

## 2015-12-31 DIAGNOSIS — R350 Frequency of micturition: Secondary | ICD-10-CM | POA: Diagnosis not present

## 2015-12-31 DIAGNOSIS — R8290 Unspecified abnormal findings in urine: Secondary | ICD-10-CM

## 2015-12-31 DIAGNOSIS — Z029 Encounter for administrative examinations, unspecified: Secondary | ICD-10-CM | POA: Insufficient documentation

## 2015-12-31 DIAGNOSIS — E785 Hyperlipidemia, unspecified: Secondary | ICD-10-CM | POA: Diagnosis not present

## 2015-12-31 LAB — URINALYSIS, ROUTINE W REFLEX MICROSCOPIC
BILIRUBIN URINE: NEGATIVE
Glucose, UA: NEGATIVE mg/dL
HGB URINE DIPSTICK: NEGATIVE
KETONES UR: NEGATIVE mg/dL
Nitrite: NEGATIVE
PH: 5.5 (ref 5.0–8.0)
Protein, ur: NEGATIVE mg/dL
SPECIFIC GRAVITY, URINE: 1.02 (ref 1.005–1.030)

## 2015-12-31 LAB — URINE MICROSCOPIC-ADD ON: RBC / HPF: NONE SEEN RBC/hpf (ref 0–5)

## 2015-12-31 NOTE — Assessment & Plan Note (Signed)
Fasting lipids

## 2015-12-31 NOTE — Assessment & Plan Note (Signed)
A1c

## 2015-12-31 NOTE — Patient Instructions (Signed)
New orders for [employer] entry. CCU for UA Urine C&S A1c Fasting lipids

## 2015-12-31 NOTE — Progress Notes (Signed)
Patient ID: [name], female   DOB: February 22, 1922, 82 y.o.   MRN: [id number]   This is a nursing facility follow up for acute on chronic issue of urinary frequency and incontinence as well as her  chronic medical diagnoses  Interim medical record and care since last [place] visit was updated with review of diagnostic studies and change in clinical status since last visit were documented.   HPI: Patient is a 82 year old female with multiple medical diagnoses. These include essential hypertension, dyslipidemia dementia with behavioral disturbance ,GERD with dysphagia and renal insufficiency.  She actually describes frequency for 3-4 years. This is associated with incontinence but not other genitourinary symptoms.  She has had a history of gout as well as deep venous thrombosis. She is on Eliquis as deep venous thrombosis prophylaxis. She's also on citalopram for depression and generic Aricept for the dementia.  Labs were performed 11/11/15. Her creatinine was improved with a value of 1.33: GFR was 39. Her creatinine was 1.63 in January of this year. Glucose is only been mildly elevated, up to 106.  Review of vitals indicates that the blood pressure has been variable but essentially well controlled. The low systolic was [id number] and the high 163 over the last 13 months. The last LDL was 106 on 02/18/15; at that time HDL was protective at 112. She is on lovastatin 40 mg daily. Liver enzymes were normal in February of this year.     Review of systems: She has some chronic shortness of breath. She describes numbness in her hands. Constitutional: No fever,significant weight change, fatigue  Eyes: No redness, discharge, pain, vision change ENT/mouth: No nasal congestion,  purulent discharge, earache,change in hearing ,sore throat  Cardiovascular: No chest pain, palpitations,paroxysmal nocturnal dyspnea, claudication, edema  Respiratory: No cough, sputum production,hemoptysis, DOE ,  significant snoring,apnea  Gastrointestinal: No heartburn,dysphagia,abdominal pain, nausea / vomiting,rectal bleeding, melena,change in bowels Genitourinary: No dysuria,hematuria, pyuria,  nocturia Dermatologic: No rash, pruritus, change in appearance of skin Neurologic: No dizziness,headache,syncope, seizures, numbness , tingling Psychiatric: Some anxiety Endocrine: No excessive thirst, excessive hunger, excessive urination  Hematologic/lymphatic: No significant bruising, lymphadenopathy,abnormal bleeding Allergy/immunology: No itchy/ watery eyes, significant sneezing, urticaria, angioedema  Physical exam:  Pertinent or positive findings: she appears much younger than stated age. A uriniferous odor is present. Arcus senilis is present. She is completely edentulous. Abdomen is protuberant. 1 plus edema at the upper sock line. Feet are swollen. The pedal pulses are decreased.   General appearance:Adequately nourished; no acute distress , increased work of breathing is present.   Lymphatic: No lymphadenopathy about the head, neck, axilla . Eyes: No conjunctival inflammation or lid edema is present. There is no scleral icterus. Ears:  External ear exam shows no significant lesions or deformities.   Nose:  External nasal examination shows no deformity or inflammation. Nasal mucosa are pink and moist without lesions ,exudates Oral exam: lips and gums are healthy appearing.There is no oropharyngeal erythema or exudate . Neck:  No thyromegaly, masses, tenderness noted.    Heart:  Normal rate and regular rhythm. S1 and S2 normal without gallop, murmur, click, rub .  Lungs:Chest clear to auscultation without wheezes, rhonchi,rales , rubs. Abdomen:Bowel sounds are normal. Abdomen is soft and nontender with no organomegaly, hernias,masses. Extremities:  No cyanosis, clubbing  Neurologic exam : Strength equal  in upper & lower extremities Balance,Rhomberg,finger to nose testing could not be  completed due to clinical state Skin: Warm & dry w/o tenting. No significant lesions or  rash.    See summary under each active problem in the Problem List with associated updated therapeutic plan  Acute on chronic urinary frequency with mild hyperglycemia Incontinence Uriniferous odor

## 2016-01-03 LAB — URINE CULTURE: Culture: >=100000 — AB

## 2016-01-27 DIAGNOSIS — F0390 Unspecified dementia without behavioral disturbance: Secondary | ICD-10-CM | POA: Diagnosis not present

## 2016-01-27 DIAGNOSIS — F329 Major depressive disorder, single episode, unspecified: Secondary | ICD-10-CM | POA: Diagnosis not present

## 2016-01-27 DIAGNOSIS — F411 Generalized anxiety disorder: Secondary | ICD-10-CM | POA: Diagnosis not present

## 2016-02-10 ENCOUNTER — Encounter: Payer: Self-pay | Admitting: Internal Medicine

## 2016-02-10 ENCOUNTER — Non-Acute Institutional Stay (SKILLED_NURSING_FACILITY): Payer: Medicare Other | Admitting: Internal Medicine

## 2016-02-10 DIAGNOSIS — K219 Gastro-esophageal reflux disease without esophagitis: Secondary | ICD-10-CM | POA: Diagnosis not present

## 2016-02-10 DIAGNOSIS — N289 Disorder of kidney and ureter, unspecified: Secondary | ICD-10-CM | POA: Diagnosis not present

## 2016-02-10 DIAGNOSIS — I10 Essential (primary) hypertension: Secondary | ICD-10-CM

## 2016-02-10 DIAGNOSIS — F0391 Unspecified dementia with behavioral disturbance: Secondary | ICD-10-CM

## 2016-02-10 DIAGNOSIS — F03918 Unspecified dementia, unspecified severity, with other behavioral disturbance: Secondary | ICD-10-CM

## 2016-02-10 NOTE — Progress Notes (Signed)
Patient ID: [name], female   DOB: December 07, 1921, 82 y.o.   MRN: [id number]  Location:    [facility] Home Room Number: 122/D Place of Service:  SNF (31)  Unice Cobble, MD  Patient Care Team: Hendricks Limes, MD as PCP - General (Internal Medicine)  Extended Emergency Contact Information Primary Emergency Contact: [name], [place] [address] [name] of [place] Phone: [phone] Relation: Daughter Secondary Emergency Contact: [name] States of [place] Phone: [phone] Relation: Son  Code Status:  Goals of care: Advanced Directive information Advanced Directives 02/10/2016  Does patient have an advance directive? Yes  Type of Advance Directive Out of facility DNR (pink MOST or yellow form)  Does patient want to make changes to advanced directive? No - Patient declined  Copy of advanced directive(s) in chart? Yes     Chief Complaint  Patient presents with  . Medical Management of Chronic Issues    Routine visit    Medical management of chronic medical issues including hypertension-dementia-CHF-GERD-depression-renal insufficiency history left leg DVT  HPI:  Patient is a pleasant 82 year old female with the above diagnoses-she has been fairly stable recently he does have some chronic lower extremity edema venous stasis changes which actually appears increased at times and she receives is somewhat increased dose of Lasix which is effective.  She does have a history of chronic diastolic CHF but her BNP is in the past consistently of been unremarkable.  Her weight appears to be stable at 201.4.  She does have a history of renal insufficiency creatinine most recently has been relatively stable at 1.33 BUN of 38 per March lab  Vital signs appear to be stable at times her blood pressure does have variability I see listed ones ranging from 140s up to [id number] systolically at times however I believe there may be cuff related issue  secondary to the size of her arm I did take it manually today with a larger cuff and got 104/60  In the past she has complained of difficulty swallowing she had a GI consult he ordered a CT scan of the abdomen did not show any acute process did show colonic diverticulosis without evidence of acute diverticulitis also a fibroid uterus.  Back in 2012 she did have an EGD basically showed age-related esophageal motility decline in no acute issues however.  In regards to depression she is on Celexa as well as Depakote as a mood stabilizer this appears stable although at times she has somewhat increased anxiety.  Her most consistent complain appears to be lower extremity edema leg elevation is being encouraged although apparently there is some issue with compliance here at times--this appears relatively baseline today-this is complicated with a history of a left leg DVT back in September venous ultrasound did show a minimal nonocclusive possibly acute DVT in the distal femoral vein  .          Past Medical History  Diagnosis Date  . Cellulitis 2016  . Dementia   . Anxiety   . Osteoarthritis   . Hyperlipidemia   . Hypertension   . Syncope   . Muscle weakness   . Gait abnormality   . GERD (gastroesophageal reflux disease)   . Gout   . Myocardial infarction ([place]) 20 yrs ago    [place]  . CHF (congestive heart failure) ([place])   . Chronic kidney disease   . Glaucoma   . Dysphagia  Past Surgical History  Procedure Laterality Date  . Appendectomy    . Tonsillectomy    . Cataract extraction w/phaco  08/04/2011    Procedure: CATARACT EXTRACTION PHACO AND INTRAOCULAR LENS PLACEMENT (IOC);  Surgeon: Tonny Branch;  Location: [facility];  Service: Ophthalmology;  Laterality: Right;  CDE=18.53  . Cataract extraction w/phaco  08/25/2011    Procedure: CATARACT EXTRACTION PHACO AND INTRAOCULAR LENS PLACEMENT (IOC);  Surgeon: Tonny Branch;  Location: [facility];  Service: Ophthalmology;  Laterality: Left;   CDE:14.76    Allergies  Allergen Reactions  . Bee Venom Anaphylaxis  . Aspirin     Directed by physician  . Cabbage Other (See Comments)    Certain foods due to gout flares  . Latex Rash   Medications.  Aricept 10 mg daily.  Calcium 500 mg 3 times a day.  Celexa 10 mg daily.  Claritin 10 mg daily.  DuoNeb nebulizers every 6 hours when necessary.  Lasix 40 mg daily.  Lotensin 40 mg daily.  Lovastatin 40 mg daily daily at bedtime.  Mylanta when necessary.  Norvasc 5 mg daily.  Eliquis 5 mg twice a day.  Potassium 10 mEq daily.  Protonix 40 mg twice a day.  Restasis eyedrops twice a day.  Robitussin every 6 hours when necessary.  Tessalon pearls 100 mg twice a day when necessary.  Tylenol Extra Strength 500 mg after 2-twice a day.  Ativan 0.25 mg every 6 hours when necessary    Family medical social history reviewed per H&P on 02/07/2011.     Review of Systems    General no complaints of fever chills weight appears to be relatively stable. She has no complaints today appears to be enjoying a...period  Of stability  Skin does not complain of rashes or itching.  Head ears eyes nose mouth and throat does not complain of any visual changes or sore throat she does have some history of allergic rhinitis.  Respiratory is not complaining of any increased shortness of breath beyond baseline does not complain of cough.  Cardiac does not complain of chest pain or palpitations chronic lower extremity edema-  GI does not complain of abdominal pain nausea vomiting diarrhea constipation.  Muscle skeletal does have a history of back pain and osteoarthritis but this appears to be fairly well controlled on Tylenol.  Neurologic does not complain of dizziness headache or numbness.  Psych history of anxiety and depression and mood disorder this is been relatively stable today she does have increased anxiety.    Immunization History  Administered Date(s)  Administered  . Influenza Whole 06/11/2007, 10/16/2008, 05/21/2009, 05/05/2010  . Influenza-Unspecified 06/05/2014  . Pneumococcal Polysaccharide-23 06/11/2007  . Td 06/11/2007  . Zoster 05/21/2007   Pertinent  Health Maintenance Due  Topic Date Due  . PNA vac Low Risk Adult (2 of 2 - PCV13) 02/09/2017 (Originally 06/10/2008)  . INFLUENZA VACCINE  03/29/2016  . DEXA SCAN  Completed   No flowsheet data found. Functional Status Survey:    Filed Vitals:   02/10/16 1454  BP: 160/54  Pulse: 63  Temp: 97.7 F (36.5 C)  TempSrc: Oral  Resp: 20  Height: 4\' 11"  (1.499 m)  Weight: 201 lb 6.4 oz (91.354 kg)  Of note Manual blood pressure taken today was 105/60-weight appears to be stable at 201.4  Physical Exam   In general is a pleasant elderly  female in no distress.  Her skin is warm and dry.  She does have venous stasis changes left  greater than right lower extremities.  Eyes pupils appear equal round reactive light visual acuity appears grossly intact.  Oropharynx is clear mucous membranes moist.  Chest generally clear to auscultation -- no labored breathing.  Heart is regular rate and rhythm without murmur gallop or rub she has baseline lower extremity venous stasis changes possibly 2-3+ left somewhat greater than right which is not new.  Abdomen is obese soft nontender positive bowel sounds.  Muscle skeletal moves all extremities at baseline is ambulating in a wheelchair-do not note any acute deformities she does have arthritic changes continues have some pain with palpitation of her legs but this is not new.  Neurologic is grossly intact her speech is clear I don't see any focal weaknesses cranial nerves are grossly intact.  Psych she is oriented to self she is pleasant conversant incontinence and with some confusion at times I suspect dementia  continues to be somewhat mild.   Labs reviewed:  Recent Labs  09/14/15 0600 10/08/15 0705 11/11/15 0730  NA 143 142  144  K 4.2 4.0 3.9  CL 108 106 107  CO2 27 26 28   GLUCOSE 106* 102* 95  BUN 37* 31* 38*  CREATININE 1.38* 1.44* 1.33*  CALCIUM 9.1 9.0 9.0    Recent Labs  02/18/15 0750 05/19/15 1945 10/08/15 0705  AST 14* 17 16  ALT 12* 13* 12*  ALKPHOS 116 130* 112  BILITOT 0.6 0.4 0.6  PROT 7.3 7.5 7.5  ALBUMIN 4.0 4.1 3.9    Recent Labs  08/22/15 1710 10/08/15 0705 11/11/15 0730  WBC 6.3 6.5 5.8  NEUTROABS 3.2 3.9 3.2  HGB 11.5* 11.8* 12.2  HCT 36.2 36.8 38.9  MCV 90.7 88.0 87.4  PLT 201 208 202   Lab Results  Component Value Date   TSH 1.454 04/10/2015   No results found for: HGBA1C Lab Results  Component Value Date   CHOL 232* 02/18/2015   HDL 112 02/18/2015   LDLCALC 106* 02/18/2015   TRIG 71 02/18/2015   CHOLHDL 2.1 02/18/2015    Significant Diagnostic Results in last 30 days:  No results found.  Assessment/Plan T  #1 hypertension-Continues  to have quite a bit of variability on Lotensin and Norvasc as noted above she is on Lotensin 40 milligrams a dayNorvasc 5 mg a day and Lasix currently 40 mg a day.--Annual reading today was 104/60-I see higher readings but suspect these could be related to difficulty getting an accurate blood pressure with the machine  #2 dementia appears to be at baseline relatively mild she is on Aricept with Depakote as a mood stabilizer.  History of CHF edema-edema and weight appears to be stable at this point will continue current dose of Lasix- -also will update a metabolic panel to make sure renal function electrolytes remained stable.  #4 history renal insufficiency as stated above she does have some chronic renal insufficiency creatinine of1.33 on lab done in March appears relatively baseline Will update this.  #5 history of dysphagia this appears stable on protonix  she has been followed by GI in the past.  #6 hyperlipidemia she is on Mevacor this appears to be looking quite well-she has a history of consistently high HDLs-with  an HDL 112 LDL 106--recent liver function tests remain within normal limits--will  update liver function tests  #7 depression this appears stable on Celexa her periods of anxiety appear to be fairly well controlled and not consistent-she is followed by the psychiatric nurse practitioner and she is on Ativan  0.25 twice a day.  #8 anemia suspect there is an element of chronic disease here hemoglobin 12 on lab done in March  this appears relatively baseline Will update this.  #9 history of allergic rhinitis this appears stable she is on Claritin.  #10Gout--this has been stable for quite a while I suspect her leg pain is quite chronic this does not appear to be a gout presentation.  #11 history of left leg DVT this is stable fairly minimal she is on Eliquis-still has  some slightly increased edema on that leg versus her right.She has been on anticoagulation since September 2016-will discuss with Dr. Linna Darner possibility of possibly discontinuing the Eliquist  [id number]-of note greater than 35 minutes spent assessing patient--discussing her status with nursing staff-and coordinating formulating a plan of care for numerous diagnoses-of note greater than 50% of time spent coordinating plan of care

## 2016-02-11 ENCOUNTER — Encounter (HOSPITAL_COMMUNITY)
Admission: RE | Admit: 2016-02-11 | Discharge: 2016-02-11 | Disposition: A | Discharge status: Home / Self Care | Payer: Medicare Other | Source: Skilled Nursing Facility | Attending: Internal Medicine | Admitting: Internal Medicine

## 2016-02-11 DIAGNOSIS — R609 Edema, unspecified: Secondary | ICD-10-CM | POA: Insufficient documentation

## 2016-02-11 DIAGNOSIS — F419 Anxiety disorder, unspecified: Secondary | ICD-10-CM | POA: Insufficient documentation

## 2016-02-11 DIAGNOSIS — F339 Major depressive disorder, recurrent, unspecified: Secondary | ICD-10-CM | POA: Insufficient documentation

## 2016-02-11 DIAGNOSIS — K210 Gastro-esophageal reflux disease with esophagitis: Secondary | ICD-10-CM | POA: Insufficient documentation

## 2016-02-11 DIAGNOSIS — I509 Heart failure, unspecified: Secondary | ICD-10-CM | POA: Insufficient documentation

## 2016-02-11 LAB — COMPREHENSIVE METABOLIC PANEL
ALK PHOS: 98 U/L (ref 38–126)
ALT: 13 U/L — ABNORMAL LOW (ref 14–54)
ANION GAP: 7 (ref 5–15)
AST: 15 U/L (ref 15–41)
Albumin: 3.8 g/dL (ref 3.5–5.0)
BILIRUBIN TOTAL: 0.6 mg/dL (ref 0.3–1.2)
BUN: 35 mg/dL — AB (ref 6–20)
CO2: 27 mmol/L (ref 22–32)
Calcium: 9.2 mg/dL (ref 8.9–10.3)
Chloride: 107 mmol/L (ref 101–111)
Creatinine, Ser: 1.21 mg/dL — ABNORMAL HIGH (ref 0.44–1.00)
GFR calc non Af Amer: 38 mL/min — ABNORMAL LOW (ref >60)
GFR, EST AFRICAN AMERICAN: 44 mL/min — AB (ref >60)
Glucose, Bld: 92 mg/dL (ref 65–99)
Potassium: 4.1 mmol/L (ref 3.5–5.1)
Sodium: 141 mmol/L (ref 135–145)
Total Protein: 7.2 g/dL (ref 6.5–8.1)

## 2016-02-11 LAB — CBC WITH DIFFERENTIAL/PLATELET
Basophils Absolute: 0 10*3/uL (ref 0.0–0.1)
Basophils Relative: 0 %
EOS ABS: 0.1 10*3/uL (ref 0.0–0.7)
Eosinophils Relative: 3 %
HEMATOCRIT: 36.6 % (ref 36.0–46.0)
HEMOGLOBIN: 11.6 g/dL — AB (ref 12.0–15.0)
LYMPHS ABS: 1.6 10*3/uL (ref 0.7–4.0)
Lymphocytes Relative: 35 %
MCH: 27.2 pg (ref 26.0–34.0)
MCHC: 31.7 g/dL (ref 30.0–36.0)
MCV: 85.7 fL (ref 78.0–100.0)
MONOS PCT: 10 %
Monocytes Absolute: 0.4 10*3/uL (ref 0.1–1.0)
NEUTROS ABS: 2.3 10*3/uL (ref 1.7–7.7)
NEUTROS PCT: 52 %
Platelets: 205 10*3/uL (ref 150–400)
RBC: 4.27 MIL/uL (ref 3.87–5.11)
RDW: 16.4 % — ABNORMAL HIGH (ref 11.5–15.5)
WBC: 4.4 10*3/uL (ref 4.0–10.5)

## 2016-02-21 ENCOUNTER — Encounter (HOSPITAL_COMMUNITY)
Admission: RE | Admit: 2016-02-21 | Discharge: 2016-02-21 | Disposition: A | Discharge status: Home / Self Care | Payer: Medicare Other | Source: Skilled Nursing Facility | Attending: Internal Medicine | Admitting: Internal Medicine

## 2016-02-21 DIAGNOSIS — F339 Major depressive disorder, recurrent, unspecified: Secondary | ICD-10-CM | POA: Diagnosis not present

## 2016-02-21 DIAGNOSIS — I509 Heart failure, unspecified: Secondary | ICD-10-CM | POA: Diagnosis not present

## 2016-02-21 DIAGNOSIS — F419 Anxiety disorder, unspecified: Secondary | ICD-10-CM | POA: Diagnosis not present

## 2016-02-21 DIAGNOSIS — K210 Gastro-esophageal reflux disease with esophagitis: Secondary | ICD-10-CM | POA: Diagnosis not present

## 2016-02-21 DIAGNOSIS — R609 Edema, unspecified: Secondary | ICD-10-CM | POA: Diagnosis not present

## 2016-02-21 LAB — URINALYSIS, ROUTINE W REFLEX MICROSCOPIC
Bilirubin Urine: NEGATIVE
GLUCOSE, UA: NEGATIVE mg/dL
Ketones, ur: NEGATIVE mg/dL
Nitrite: NEGATIVE
PROTEIN: NEGATIVE mg/dL
Specific Gravity, Urine: 1.01 (ref 1.005–1.030)
pH: 6 (ref 5.0–8.0)

## 2016-02-21 LAB — URINE MICROSCOPIC-ADD ON

## 2016-02-23 ENCOUNTER — Other Ambulatory Visit (HOSPITAL_COMMUNITY)
Admission: RE | Admit: 2016-02-23 | Discharge: 2016-02-23 | Disposition: A | Discharge status: Home / Self Care | Payer: Medicare Other | Source: Skilled Nursing Facility | Attending: Internal Medicine | Admitting: Internal Medicine

## 2016-02-23 DIAGNOSIS — N390 Urinary tract infection, site not specified: Secondary | ICD-10-CM | POA: Diagnosis not present

## 2016-02-23 LAB — URINALYSIS W MICROSCOPIC (NOT AT ARMC)
Bilirubin Urine: NEGATIVE
GLUCOSE, UA: NEGATIVE mg/dL
Hgb urine dipstick: NEGATIVE
Ketones, ur: NEGATIVE mg/dL
NITRITE: POSITIVE — AB
PH: 5 (ref 5.0–8.0)
Protein, ur: NEGATIVE mg/dL
Specific Gravity, Urine: 1.015 (ref 1.005–1.030)

## 2016-02-23 LAB — URINE CULTURE

## 2016-02-26 ENCOUNTER — Non-Acute Institutional Stay (SKILLED_NURSING_FACILITY): Payer: Medicare Other | Admitting: Internal Medicine

## 2016-02-26 ENCOUNTER — Encounter: Payer: Self-pay | Admitting: Internal Medicine

## 2016-02-26 DIAGNOSIS — I10 Essential (primary) hypertension: Secondary | ICD-10-CM | POA: Diagnosis not present

## 2016-02-26 DIAGNOSIS — N289 Disorder of kidney and ureter, unspecified: Secondary | ICD-10-CM

## 2016-02-26 DIAGNOSIS — I5030 Unspecified diastolic (congestive) heart failure: Secondary | ICD-10-CM | POA: Diagnosis not present

## 2016-02-26 LAB — URINE CULTURE

## 2016-02-26 NOTE — Progress Notes (Signed)
Location:   [facility] Room Number: 122/D Place of Service:  SNF (31) Provider:  Leeanne Deed, MD  Patient Care Team: Hendricks Limes, MD as PCP - General (Internal Medicine)  Extended Emergency Contact Information Primary Emergency Contact: [name], [place] [address] [name] of [place] Phone: [phone] Relation: Daughter Secondary Emergency Contact: [name] States of [place] Phone: [phone] Relation: Son  Code Status:  DNR Goals of care: Advanced Directive information Advanced Directives 02/26/2016  Does patient have an advance directive? Yes  Type of Advance Directive Out of facility DNR (pink MOST or yellow form)  Does patient want to make changes to advanced directive? No - Patient declined  Copy of advanced directive(s) in chart? Yes     Chief Complaint  Patient presents with  . Acute Visit    E coli possitive  for UTI  HPI:  Pt is a 82 y.o. female seen today for an acute visit for UTI-apparently she complained a nursing staff of dysuria and a urine culture was obtained which shows greater than 100,000 colonies of Escherichia coli.  Currently she is afebrile since the dysuria has improved somewhat.  The Escherichia coli is pansensitive-she has no other complaints.  I do note recent blood pressure 144/53 but most recent one is 127/56 she has some variability her at least some of this may be related to using the proper blood pressure cuff size at this point will monitor.  She currently on hydralazine 10 mg twice a day Norvasc 5 mg a day Lotensin 40 mg a day as well as Lasix 40 mg a day     Past Medical History  Diagnosis Date  . Cellulitis 2016  . Dementia   . Anxiety   . Osteoarthritis   . Hyperlipidemia   . Hypertension   . Syncope   . Muscle weakness   . Gait abnormality   . GERD (gastroesophageal reflux disease)   . Gout   . Myocardial infarction ([place]) 20 yrs ago    [place]  . CHF (congestive heart failure) ([place])   . Chronic kidney disease   . Glaucoma   . Dysphagia    Past Surgical History  Procedure Laterality Date  . Appendectomy    . Tonsillectomy    . Cataract extraction w/phaco  08/04/2011    Procedure: CATARACT EXTRACTION PHACO AND INTRAOCULAR LENS PLACEMENT (IOC);  Surgeon: Tonny Branch;  Location: [facility];  Service: Ophthalmology;  Laterality: Right;  CDE=18.53  . Cataract extraction w/phaco  08/25/2011    Procedure: CATARACT EXTRACTION PHACO AND INTRAOCULAR LENS PLACEMENT (IOC);  Surgeon: Tonny Branch;  Location: [facility];  Service: Ophthalmology;  Laterality: Left;  CDE:14.76    Allergies  Allergen Reactions  . Bee Venom Anaphylaxis  . Aspirin     Directed by physician  . Cabbage Other (See Comments)    Certain foods due to gout flares  . Latex Rash    Current Outpatient Prescriptions on File Prior to Visit  Medication Sig Dispense Refill  . acetaminophen (TYLENOL) 500 MG tablet Take 1,000 mg by mouth 2 (two) times daily. For leg pain and arthritis. May have additional 1000 mg for break through pain    . amLODipine (NORVASC) 5 MG tablet Take 5 mg by mouth daily.    Marland Kitchen apixaban (ELIQUIS) 5 MG TABS tablet Take 5 mg by mouth 2 (two) times daily.    . benazepril (  LOTENSIN) 40 MG tablet Take 40 mg by mouth daily.    . Brinzolamide-Brimonidine (SIMBRINZA) 1-0.2 % SUSP Apply to eye. One  drop into both eyes three times a day    . calcium carbonate (TUMS - DOSED IN MG ELEMENTAL CALCIUM) 500 MG chewable tablet Chew 1 tablet by mouth 3 (three) times daily. Take three times a day before meals    . citalopram (CELEXA) 20 MG tablet Take 20 mg by mouth daily.    . cycloSPORINE (RESTASIS) 0.05 % ophthalmic emulsion Place 1 drop into both eyes 2 (two) times daily.    Marland Kitchen donepezil (ARICEPT) 10 MG tablet Take 10 mg by mouth at bedtime. For dementia    . furosemide (LASIX) 40 MG tablet Decrease to 40 mg only on day 3 and there after    . hydrALAZINE  (APRESOLINE) 10 MG tablet Take 10 mg by mouth 2 (two) times daily.     Marland Kitchen loratadine (CLARITIN) 10 MG tablet Take 10 mg by mouth daily.    Marland Kitchen LORazepam (ATIVAN) 0.5 MG tablet Take 0.5 mg by mouth 2 (two) times daily.    Marland Kitchen lovastatin (MEVACOR) 40 MG tablet Take 40 mg by mouth at bedtime.    . nitroGLYCERIN (NITROSTAT) 0.4 MG SL tablet Place 0.4 mg under the tongue every 5 (five) minutes as needed for chest pain.     . pantoprazole (PROTONIX) 40 MG tablet Take 40 mg by mouth daily.     Marland Kitchen POTASSIUM PO Take 10 meq by mouth daily    . Travoprost, BAK Free, (TRAVATAN) 0.004 % SOLN ophthalmic solution Place 1 drop into both eyes at bedtime. For glaucoma     No current facility-administered medications on file prior to visit.     Review of Systems    In general no complaints of fever or chills  Skin does not complain of rashes or itching.  Head ears eyes nose mouth and throat does not complain of sore throat or visual changes.  Respiratory does not complain of shortness of breath or cough.  Cardiac does not complain of chest pain or palpitations.  Has chronic lower extremity edema.  GI does not complain of nausea vomiting diarrhea constipation.  GU says dysuria has improved somewhat.  Musculoskeletal is not currently complaining of joint pain has some chronic tenderness to her lower legs bilaterally.  Neurologic is not complaining dizziness headache or numbness.  Psych does have history of anxiety and dementia this is been relatively stable recently   Immunization History  Administered Date(s) Administered  . Influenza Whole 06/11/2007, 10/16/2008, 05/21/2009, 05/05/2010  . Influenza-Unspecified 06/05/2014  . Pneumococcal Polysaccharide-23 06/11/2007  . Td 06/11/2007  . Zoster 05/21/2007   Pertinent  Health Maintenance Due  Topic Date Due  . PNA vac Low Risk Adult (2 of 2 - PCV13) 02/09/2017 (Originally 06/10/2008)  . INFLUENZA VACCINE  03/29/2016  . DEXA SCAN  Completed    No flowsheet data found. Functional Status Survey:    Filed Vitals:   02/26/16 1010  BP: 144/53  Pulse: 68  Temp: 97.1 F (36.2 C)  TempSrc: Oral  Resp: 20  Height: 4\' 11"  (1.499 m)  Weight: 206 lb 1.6 oz (93.486 kg)  Of note updated blood pressure is 127/56 pulse 71 respirations 20 temperature is 98.4  Updated weight is 201.8 pounds this is relatively stable there is some variation usually between 200-205 Body mass index is 41.6 kg/(m^2). Physical Exam   In general this a pleasant LE female in no  distress appears to be in good spirits.  Her skin is warm and dry.  Continues a venous stasis changes of her legs bilaterally left greater than right which is not new.  Eyes pupils appear reactive light sclera and conjunctiva are clear visual acuity appears grossly intact.  Oropharynx is clear mucous membranes moist.  Chest is clear to auscultation with somewhat shallow air entry there is no labored breathing.  Heart is regular rate and rhythm without murmur gallop or rub continues with 2+ lower extremity edema this actually appears slightly improved from previous exams somewhat more edema on the right versus left which is not new.  Her abdomen is obese soft nontender positive bowel sounds.  Musculoskeletal moves all her extremities at baseline is ambulating in a wheelchair today-she continues have some tenderness to palpation of her lower legs bilaterally this is chronic.  Neurologic is grossly intact her speech is clear I don't see any lateralizing findings.  Psych she is pleasant and appropriate smiling appears to be in good spirits does have mild cognitive impairment.    Labs reviewed:  Recent Labs  10/08/15 0705 11/11/15 0730 02/11/16 0740  NA 142 144 141  K 4.0 3.9 4.1  CL 106 107 107  CO2 26 28 27   GLUCOSE 102* 95 92  BUN 31* 38* 35*  CREATININE 1.44* 1.33* 1.21*  CALCIUM 9.0 9.0 9.2    Recent Labs  05/19/15 1945 10/08/15 0705 02/11/16 0740  AST  17 16 15   ALT 13* 12* 13*  ALKPHOS 130* 112 98  BILITOT 0.4 0.6 0.6  PROT 7.5 7.5 7.2  ALBUMIN 4.1 3.9 3.8    Recent Labs  10/08/15 0705 11/11/15 0730 02/11/16 0740  WBC 6.5 5.8 4.4  NEUTROABS 3.9 3.2 2.3  HGB 11.8* 12.2 11.6*  HCT 36.8 38.9 36.6  MCV 88.0 87.4 85.7  PLT 208 202 205   Lab Results  Component Value Date   TSH 1.454 04/10/2015   No results found for: HGBA1C Lab Results  Component Value Date   CHOL 232* 02/18/2015   HDL 112 02/18/2015   LDLCALC 106* 02/18/2015   TRIG 71 02/18/2015   CHOLHDL 2.1 02/18/2015    Significant Diagnostic Results in last 30 days:  No results found.  Assessment/Plan #1 UTI-this is sensitive to numerous antibiotics will start ciprofloxacin 250 mg twice a day for 7 days and monitor also will start probiotic twice a day for 10 days.  #2 hypertension continues on numerous agents as noted above do not see consistent elevations will monitor for now.  #3 renal insufficiency recent creatinine of 1.21 on lab done June 15 appears to be relatively baseline at this point will monitor.  History of diastolic CHF her weight appears to be relatively stable continues on Lasix with potassium supplementation renal function appears to have stabilized as well which is encouraging.  [place], [place], [place]

## 2016-03-09 ENCOUNTER — Encounter (HOSPITAL_COMMUNITY)
Admission: RE | Admit: 2016-03-09 | Discharge: 2016-03-09 | Disposition: A | Discharge status: Home / Self Care | Payer: Medicare Other | Source: Skilled Nursing Facility | Attending: Internal Medicine | Admitting: Internal Medicine

## 2016-03-09 ENCOUNTER — Encounter: Payer: Self-pay | Admitting: Internal Medicine

## 2016-03-09 DIAGNOSIS — Z029 Encounter for administrative examinations, unspecified: Secondary | ICD-10-CM | POA: Diagnosis present

## 2016-03-09 LAB — LIPID PANEL
CHOL/HDL RATIO: 2 ratio
Cholesterol: 203 mg/dL — ABNORMAL HIGH (ref 0–200)
HDL: 101 mg/dL (ref >40)
LDL CALC: 93 mg/dL (ref 0–99)
Triglycerides: 45 mg/dL (ref <150)
VLDL: 9 mg/dL (ref 0–40)

## 2016-03-10 ENCOUNTER — Encounter (HOSPITAL_COMMUNITY)
Admission: RE | Admit: 2016-03-10 | Discharge: 2016-03-10 | Disposition: A | Discharge status: Home / Self Care | Payer: Medicare Other | Source: Skilled Nursing Facility | Attending: Internal Medicine | Admitting: Internal Medicine

## 2016-03-10 DIAGNOSIS — Z029 Encounter for administrative examinations, unspecified: Secondary | ICD-10-CM | POA: Diagnosis not present

## 2016-03-10 LAB — CK: Total CK: 109 U/L (ref 38–234)

## 2016-03-10 LAB — HEMOGLOBIN A1C
HEMOGLOBIN A1C: 6 % — AB (ref 4.8–5.6)
MEAN PLASMA GLUCOSE: 126 mg/dL

## 2016-03-15 ENCOUNTER — Other Ambulatory Visit (HOSPITAL_COMMUNITY)
Admission: RE | Admit: 2016-03-15 | Discharge: 2016-03-15 | Disposition: A | Discharge status: Home / Self Care | Payer: Medicare Other | Source: Skilled Nursing Facility | Attending: Internal Medicine | Admitting: Internal Medicine

## 2016-03-15 ENCOUNTER — Non-Acute Institutional Stay (SKILLED_NURSING_FACILITY): Payer: Medicare Other | Admitting: Internal Medicine

## 2016-03-15 ENCOUNTER — Encounter: Payer: Self-pay | Admitting: Internal Medicine

## 2016-03-15 DIAGNOSIS — N289 Disorder of kidney and ureter, unspecified: Secondary | ICD-10-CM

## 2016-03-15 DIAGNOSIS — M109 Gout, unspecified: Secondary | ICD-10-CM

## 2016-03-15 DIAGNOSIS — I10 Essential (primary) hypertension: Secondary | ICD-10-CM | POA: Diagnosis not present

## 2016-03-15 DIAGNOSIS — N390 Urinary tract infection, site not specified: Secondary | ICD-10-CM | POA: Insufficient documentation

## 2016-03-15 DIAGNOSIS — R195 Other fecal abnormalities: Secondary | ICD-10-CM | POA: Diagnosis not present

## 2016-03-15 DIAGNOSIS — R6883 Chills (without fever): Secondary | ICD-10-CM

## 2016-03-15 LAB — URINALYSIS, ROUTINE W REFLEX MICROSCOPIC
Bilirubin Urine: NEGATIVE
GLUCOSE, UA: NEGATIVE mg/dL
Hgb urine dipstick: NEGATIVE
KETONES UR: NEGATIVE mg/dL
LEUKOCYTES UA: NEGATIVE
Nitrite: NEGATIVE
PH: 5.5 (ref 5.0–8.0)
Protein, ur: NEGATIVE mg/dL
SPECIFIC GRAVITY, URINE: 1.02 (ref 1.005–1.030)

## 2016-03-15 NOTE — Assessment & Plan Note (Signed)
Most recent creatinine 1.21, improved Check BMET

## 2016-03-15 NOTE — Patient Instructions (Addendum)
New orders for [facility] entry. CBC and differential  BMET  Uric acid UA and C&S Increase hydralazine to 25 mg twice a day Take a probiotic , Florastor OR Align, every day if stools loose. This will replace the normal bacteria which  are necessary for formation of normal stool and processing of food.

## 2016-03-15 NOTE — Progress Notes (Signed)
    Facility Location: [facility] Room Number: 122/D  This is a nursing facility follow up for specific chronic medical diagnoses; however, she has acute complaints of chills for least 3 weeks.   Interim medical record and care since last [place] visit was updated with review of diagnostic studies and change in clinical status since last visit were documented.  HPI: Diagnoses include HTN, hx DVT, anxiety/depression, early dementia, GERD, and dyslipidemia. Her creatinine has varied from 1.21-1.44. She's also exhibited a mild anemia  intermittently. She had mild hyperglycemia, A1c has been nondiabetic at 6%. She did have Escherichia coli urinary tract infection 02/23/16.   + ROS: She has chronic frontal headaches. She describes sputum which is thick ;she has difficulty visualizing secretions due to her glaucoma. No diarrhea present but stools are loose. She also describes some incontinence of stool. Dysphagia is a recurrent problem. Anxiety is a chronic problem.  Negative ROS: Constitutional: No fever,significant weight change, fatigue . Eyes: No redness, discharge, pain, vision change ENT/mouth: No nasal congestion,  purulent discharge, earache,change in hearing ,sore throat  Cardiovascular: No chest pain, palpitations,paroxysmal nocturnal dyspnea, claudication, edema  Respiratory: No hemoptysis, DOE , significant snoring,apnea  Gastrointestinal: No heartburn,acute abdominal pa,change in bowels Genitourinary: No dysuria,hematuria, pyuria,  incontinence, nocturia Musculoskeletal: No joint stiffness, joint swelling, weakness,pain Dermatologic: No rash, pruritus, change in appearance of skin Endocrine: No change in hair/skin/ nails, excessive thirst, excessive hunger, excessive urination  Hematologic/lymphatic: No significant bruising, lymphadenopathy,abnormal bleeding Allergy/immunology: No itchy/ watery eyes, significant sneezing, urticaria, angioedema  Physical  exam:  Pertinent or positive findings: She appears younger than her stated age. She is completely edentulous. She has a grade 1/2 systolic murmur. Panniculus is massive. She has 2+ pedal edema. Pedal pulses are decreased. There are deformities of hyperpigmentation of the index finger nail and thumbnail. General appearance:Adequately nourished; no acute distress , increased work of breathing is present.   Lymphatic: No lymphadenopathy about the head, neck, axilla . Eyes: No conjunctival inflammation or lid edema is present. There is no scleral icterus. Ears:  External ear exam shows no significant lesions or deformities.   Nose:  External nasal examination shows no deformity or inflammation. Nasal mucosa are pink and moist without lesions ,exudates Oral exam: lips and gums are healthy appearing.There is no oropharyngeal erythema or exudate . Neck:  No thyromegaly, masses, tenderness noted.    Heart:  Normal rate and regular rhythm. S1 and S2 normal without gallop, murmur, click, rub .  Lungs:Chest clear to auscultation without wheezes, rhonchi,rales , rubs. Abdomen:Bowel sounds are normal. Abdomen is soft and nontender with no organomegaly, hernias,masses. GU: deferred as previously addressed. Extremities:  No cyanosis, clubbing,edema  Neurologic exam : Cn 2-7 intact Strength equal  in upper & lower extremities Balance,Rhomberg,finger to nose testing could not be completed due to clinical state Deep tendon reflexes are equal Skin: Warm & dry w/o tenting. No significant lesions or rash.    See summary under each active problem in the Problem List with associated updated therapeutic plan

## 2016-03-15 NOTE — Assessment & Plan Note (Signed)
Last uric acid was 6.1 in 2011 Recheck uric acid

## 2016-03-15 NOTE — Assessment & Plan Note (Addendum)
Her blood pressure averages 149.8 / 63.4 despite 3 antihypertensive medications. She has refused to take hydralazine 3 times a day. Increase hydralazine to 25 mg twice a day

## 2016-03-16 ENCOUNTER — Encounter (HOSPITAL_COMMUNITY)
Admission: RE | Admit: 2016-03-16 | Discharge: 2016-03-16 | Disposition: A | Discharge status: Home / Self Care | Payer: Medicare Other | Source: Skilled Nursing Facility | Attending: Internal Medicine | Admitting: Internal Medicine

## 2016-03-16 DIAGNOSIS — Z029 Encounter for administrative examinations, unspecified: Secondary | ICD-10-CM | POA: Diagnosis not present

## 2016-03-16 LAB — CBC WITH DIFFERENTIAL/PLATELET
BASOS PCT: 0 %
Basophils Absolute: 0 10*3/uL (ref 0.0–0.1)
Eosinophils Absolute: 0.1 10*3/uL (ref 0.0–0.7)
Eosinophils Relative: 2 %
HEMATOCRIT: 36.1 % (ref 36.0–46.0)
HEMOGLOBIN: 11.6 g/dL — AB (ref 12.0–15.0)
LYMPHS ABS: 1.6 10*3/uL (ref 0.7–4.0)
LYMPHS PCT: 35 %
MCH: 27.7 pg (ref 26.0–34.0)
MCHC: 32.1 g/dL (ref 30.0–36.0)
MCV: 86.2 fL (ref 78.0–100.0)
MONO ABS: 0.4 10*3/uL (ref 0.1–1.0)
MONOS PCT: 8 %
NEUTROS ABS: 2.5 10*3/uL (ref 1.7–7.7)
NEUTROS PCT: 55 %
PLATELETS: 203 10*3/uL (ref 150–400)
RBC: 4.19 MIL/uL (ref 3.87–5.11)
RDW: 15.9 % — AB (ref 11.5–15.5)
WBC: 4.6 10*3/uL (ref 4.0–10.5)

## 2016-03-16 LAB — BASIC METABOLIC PANEL
ANION GAP: 9 (ref 5–15)
BUN: 42 mg/dL — ABNORMAL HIGH (ref 6–20)
CALCIUM: 9 mg/dL (ref 8.9–10.3)
CO2: 26 mmol/L (ref 22–32)
Chloride: 105 mmol/L (ref 101–111)
Creatinine, Ser: 1.31 mg/dL — ABNORMAL HIGH (ref 0.44–1.00)
GFR, EST AFRICAN AMERICAN: 40 mL/min — AB (ref >60)
GFR, EST NON AFRICAN AMERICAN: 34 mL/min — AB (ref >60)
Glucose, Bld: 93 mg/dL (ref 65–99)
POTASSIUM: 4 mmol/L (ref 3.5–5.1)
SODIUM: 140 mmol/L (ref 135–145)

## 2016-03-16 LAB — URIC ACID: URIC ACID, SERUM: 6.5 mg/dL (ref 2.3–6.6)

## 2016-03-17 LAB — URINE CULTURE

## 2016-03-28 ENCOUNTER — Encounter: Payer: Self-pay | Admitting: Internal Medicine

## 2016-03-28 ENCOUNTER — Non-Acute Institutional Stay (SKILLED_NURSING_FACILITY): Payer: Medicare Other | Admitting: Internal Medicine

## 2016-03-28 DIAGNOSIS — R609 Edema, unspecified: Secondary | ICD-10-CM | POA: Diagnosis not present

## 2016-03-28 DIAGNOSIS — I10 Essential (primary) hypertension: Secondary | ICD-10-CM

## 2016-03-28 DIAGNOSIS — L03116 Cellulitis of left lower limb: Secondary | ICD-10-CM

## 2016-03-28 NOTE — Progress Notes (Signed)
Location:   [facility] Room Number: 122/D Place of Service:  SNF [phone]) Provider:  Leeanne Deed, MD  Patient Care Team: Hendricks Limes, MD as PCP - General (Internal Medicine)  Extended Emergency Contact Information Primary Emergency Contact: [name], [place] [address] [name] of [place] Phone: [phone] Relation: Daughter Secondary Emergency Contact: [name] States of [place] Phone: [phone] Relation: Son  Code Status:  DNR Goals of care: Advanced Directive information Advanced Directives 03/28/2016  Does patient have an advance directive? Yes  Type of Advance Directive Out of facility DNR (pink MOST or yellow form)  Does patient want to make changes to advanced directive? No - Patient declined  Copy of advanced directive(s) in chart? Yes  Pre-existing out of facility DNR order (yellow form or pink MOST form) -     Chief Complaint  Patient presents with  . Acute Visit    Left leg swollen and having pain    HPI:  Pt is a 82 y.o. female seen today for an acute visit for  Initially complaints of left leg pain and possible erythema-she does have a history of a small DVT in the leg and is on Eliquist--Lahey edema on exam today does not appear to be greatly increased from baseline she also has some mildly increased edema on the left versus the right complicated with significant venous stasis changes.  She does appear to have possibly some mildly increased erythema on the lateral left leg however she does have some history of cellulitis in this area in the past.  Vital signs are currently stable she is afebrile-does not complaining of acute shortness of breath does have somewhat chronic complaints of being short of breath at times.  She does continue on Lasix 40 mg a day with potassium supplementation her weight appears to be stable at 200 pounds  Dr. Linna Darner recently did assess her  blood pressures as well and did change her hydralazine from 3 times a day to a larger dose twice a day 25 mg-she is also on Lotensin 40 mg a day Norvasc 5 mg a day-blood pressures appear to be fairly well controlled recently 124/64-115/42-136/73-I do see one 45/68-I do see 1 171/64-although higher readings appear to be somewhat unusual usually systolic is below [id number]    Past Medical History:  Diagnosis Date  . Anxiety   . Cellulitis 2016  . CHF (congestive heart failure) ([place])   . Chronic kidney disease   . Dementia   . Dysphagia   . Gait abnormality   . GERD (gastroesophageal reflux disease)   . Glaucoma   . Gout   . Hyperlipidemia   . Hypertension   . Muscle weakness   . Myocardial infarction [facility]) [place]  . Osteoarthritis   . Syncope    Past Surgical History:  Procedure Laterality Date  . APPENDECTOMY    . CATARACT EXTRACTION W/PHACO  08/04/2011   Procedure: CATARACT EXTRACTION PHACO AND INTRAOCULAR LENS PLACEMENT (IOC);  Surgeon: Tonny Branch;  Location: [facility];  Service: Ophthalmology;  Laterality: Right;  CDE=18.53  . CATARACT EXTRACTION W/PHACO  08/25/2011   Procedure: CATARACT EXTRACTION PHACO AND INTRAOCULAR LENS PLACEMENT (IOC);  Surgeon: Tonny Branch;  Location: [facility];  Service: Ophthalmology;  Laterality: Left;  CDE:14.76  . TONSILLECTOMY      Allergies  Allergen Reactions  . Bee Venom Anaphylaxis  . Aspirin  Directed by physician  . Cabbage Other (See Comments)    Certain foods due to gout flares  . Latex Rash    Current Outpatient Prescriptions on File Prior to Visit  Medication Sig Dispense Refill  . acetaminophen (TYLENOL) 500 MG tablet Take 1,000 mg by mouth 2 (two) times daily. For leg pain and arthritis. May have additional 1000 mg for break through pain    . amLODipine (NORVASC) 5 MG tablet Take 5 mg by mouth daily.    Marland Kitchen apixaban (ELIQUIS) 5 MG TABS tablet Take 5 mg by mouth 2 (two) times daily.    . benazepril (LOTENSIN) 40 MG tablet Take  40 mg by mouth daily.    . Brinzolamide-Brimonidine (SIMBRINZA) 1-0.2 % SUSP Apply to eye. One  drop into both eyes three times a day    . calcium carbonate (TUMS - DOSED IN MG ELEMENTAL CALCIUM) 500 MG chewable tablet Chew 1 tablet by mouth 3 (three) times daily. Take three times a day before meals    . citalopram (CELEXA) 20 MG tablet Take 20 mg by mouth daily.    . cycloSPORINE (RESTASIS) 0.05 % ophthalmic emulsion Place 1 drop into both eyes 2 (two) times daily.    Marland Kitchen donepezil (ARICEPT) 10 MG tablet Take 10 mg by mouth at bedtime. For dementia    . furosemide (LASIX) 40 MG tablet Take 40 mg by mouth daily.     Marland Kitchen loratadine (CLARITIN) 10 MG tablet Take 10 mg by mouth daily.    Marland Kitchen LORazepam (ATIVAN) 0.5 MG tablet Take 0.25 mg by mouth 2 (two) times daily.     Marland Kitchen lovastatin (MEVACOR) 40 MG tablet Take 40 mg by mouth at bedtime.    . nitroGLYCERIN (NITROSTAT) 0.4 MG SL tablet Place 0.4 mg under the tongue every 5 (five) minutes as needed for chest pain.     . pantoprazole (PROTONIX) 40 MG tablet Take 40 mg by mouth daily.     Marland Kitchen POTASSIUM PO Take 10 meq by mouth daily    . Travoprost, BAK Free, (TRAVATAN) 0.004 % SOLN ophthalmic solution Place 1 drop into both eyes at bedtime. For glaucoma     No current facility-administered medications on file prior to visit.   Of note she is also on hydralazine 25 mg twice a day   Review of Systems  In general no complaints of fever or chills  Skin does not complain of rashes or itching possibly increased erythema left lower extremity.  Head ears eyes nose mouth and throat does not complain of sore throat or visual changes.  Respiratory does not complain of shortness of breath  Beyond   baseline or cough.  Cardiac does not complain of chest pain or palpitations. Appears to have relatively baseline lower extremity edema  Has chronic lower extremity edema.  GI does not complain of nausea vomiting diarrhea constipation.   Musculoskeletal is  not currently complaining of joint pain has some chronic tenderness to her lower legs bilaterally.  Neurologic is not complaining dizziness headache or numbness.  Psych does have history of anxiety and dementia this is been relatively stable recently-although somewhat more anxious today which may be contributing to her complaints about her legs  Immunization History  Administered Date(s) Administered  . Influenza Whole 06/11/2007, 10/16/2008, 05/21/2009, 05/05/2010  . Influenza-Unspecified 06/05/2014  . Pneumococcal Polysaccharide-23 06/11/2007  . Td 06/11/2007  . Zoster 05/21/2007   Pertinent  Health Maintenance Due  Topic Date Due  . PNA vac Low Risk Adult (  2 of 2 - PCV13) 02/09/2017 (Originally 06/10/2008)  . INFLUENZA VACCINE  03/29/2016  . DEXA SCAN  Completed   No flowsheet data found. Functional Status Survey:    Vitals:   03/28/16 1015  BP: (!) 145/68  Pulse: 71  Resp: 20  Temp: 98.4 F (36.9 C)  TempSrc: Oral  SpO2: 97%  Of note blood pressures do show some variability as noted above  Weight appears stable at 200.8  Physical Exam   In general this a pleasant elderly female in no distress appears a bit anxious today which is common at times.  Her skin is warm and dry.  Continues a venous stasis changes of her legs bilaterally left greater than right which is not new.-She does appear possibly have some increased erythema lateral low left leg this is somewhat warm to touch  Eyes pupils appear reactive light sclera and conjunctiva are clear visual acuity appears grossly intact.  Oropharynx is clear mucous membranes moist.  Chest is clear to auscultation with somewhat shallow air entry there is no labored breathing.  Heart is regular rate and rhythm without murmur gallop or rub continues with 2+ lower extremity edema  Somewhat more on the left versus the right which is not new there is some chronic tenderness here not increased from baseline  Her  abdomen is obese soft nontender positive bowel sounds.  Musculoskeletal moves all her extremities at baseline is ambulating in a wheelchair today  Neurologic is grossly intact her speech is clear I don't see any lateralizing findings.  Psych she is pleasant and appropriate but a little anxious today which occurs at times  Labs reviewed:  Recent Labs  11/11/15 0730 02/11/16 0740 03/16/16 0710  NA 144 141 140  K 3.9 4.1 4.0  CL 107 107 105  CO2 28 27 26   GLUCOSE 95 92 93  BUN 38* 35* 42*  CREATININE 1.33* 1.21* 1.31*  CALCIUM 9.0 9.2 9.0    Recent Labs  05/19/15 1945 10/08/15 0705 02/11/16 0740  AST 17 16 15   ALT 13* 12* 13*  ALKPHOS 130* 112 98  BILITOT 0.4 0.6 0.6  PROT 7.5 7.5 7.2  ALBUMIN 4.1 3.9 3.8    Recent Labs  11/11/15 0730 02/11/16 0740 03/16/16 0710  WBC 5.8 4.4 4.6  NEUTROABS 3.2 2.3 2.5  HGB 12.2 11.6* 11.6*  HCT 38.9 36.6 36.1  MCV 87.4 85.7 86.2  PLT 202 205 203   Lab Results  Component Value Date   TSH 1.454 04/10/2015   Lab Results  Component Value Date   HGBA1C 6.0 (H) 03/09/2016   Lab Results  Component Value Date   CHOL 203 (H) 03/09/2016   HDL 101 03/09/2016   LDLCALC 93 03/09/2016   TRIG 45 03/09/2016   CHOLHDL 2.0 03/09/2016    Significant Diagnostic Results in last 30 days:  No results found.  Assessment/Plan #1 some increase left lower leg erythema-concerns for developing cellulitis Will start doxycycline 100 mg twice a day for 7 days and monitor-.  Edema and weight appears to be at baseline at this point will monitor also will update a BNP as well as BMP tomorrow.  #2 hypertension this appears to be relatively stable with occasional systolic elevations but not consistent again continues on hydralazine 25 mg twice a day-Lotensin 40 mg a day-Norvasc 5 mg a day in addition to the Lasix.  [place], [place], [place]

## 2016-03-29 ENCOUNTER — Encounter (HOSPITAL_COMMUNITY)
Admission: RE | Admit: 2016-03-29 | Discharge: 2016-03-29 | Disposition: A | Discharge status: Home / Self Care | Payer: Medicare Other | Source: Skilled Nursing Facility | Attending: Internal Medicine | Admitting: Internal Medicine

## 2016-03-29 DIAGNOSIS — I509 Heart failure, unspecified: Secondary | ICD-10-CM | POA: Diagnosis not present

## 2016-03-29 DIAGNOSIS — F339 Major depressive disorder, recurrent, unspecified: Secondary | ICD-10-CM | POA: Diagnosis not present

## 2016-03-29 DIAGNOSIS — N390 Urinary tract infection, site not specified: Secondary | ICD-10-CM | POA: Diagnosis present

## 2016-03-29 LAB — BASIC METABOLIC PANEL
Anion gap: 7 (ref 5–15)
BUN: 51 mg/dL — ABNORMAL HIGH (ref 6–20)
CALCIUM: 9.5 mg/dL (ref 8.9–10.3)
CO2: 25 mmol/L (ref 22–32)
CREATININE: 1.28 mg/dL — AB (ref 0.44–1.00)
Chloride: 110 mmol/L (ref 101–111)
GFR, EST AFRICAN AMERICAN: 41 mL/min — AB (ref >60)
GFR, EST NON AFRICAN AMERICAN: 35 mL/min — AB (ref >60)
Glucose, Bld: 102 mg/dL — ABNORMAL HIGH (ref 65–99)
Potassium: 4.1 mmol/L (ref 3.5–5.1)
Sodium: 142 mmol/L (ref 135–145)

## 2016-03-29 LAB — CBC WITH DIFFERENTIAL/PLATELET
BASOS PCT: 0 %
Basophils Absolute: 0 10*3/uL (ref 0.0–0.1)
EOS ABS: 0.1 10*3/uL (ref 0.0–0.7)
EOS PCT: 2 %
HEMATOCRIT: 36.2 % (ref 36.0–46.0)
Hemoglobin: 11.7 g/dL — ABNORMAL LOW (ref 12.0–15.0)
Lymphocytes Relative: 34 %
Lymphs Abs: 2 10*3/uL (ref 0.7–4.0)
MCH: 27.7 pg (ref 26.0–34.0)
MCHC: 32.3 g/dL (ref 30.0–36.0)
MCV: 85.6 fL (ref 78.0–100.0)
MONO ABS: 0.6 10*3/uL (ref 0.1–1.0)
MONOS PCT: 9 %
NEUTROS ABS: 3.2 10*3/uL (ref 1.7–7.7)
Neutrophils Relative %: 55 %
PLATELETS: 206 10*3/uL (ref 150–400)
RBC: 4.23 MIL/uL (ref 3.87–5.11)
RDW: 16 % — AB (ref 11.5–15.5)
WBC: 6 10*3/uL (ref 4.0–10.5)

## 2016-03-29 LAB — BRAIN NATRIURETIC PEPTIDE: B Natriuretic Peptide: 15 pg/mL (ref 0.0–100.0)

## 2016-04-18 ENCOUNTER — Encounter: Payer: Self-pay | Admitting: Internal Medicine

## 2016-04-18 ENCOUNTER — Non-Acute Institutional Stay (SKILLED_NURSING_FACILITY): Payer: Medicare Other | Admitting: Internal Medicine

## 2016-04-18 DIAGNOSIS — E785 Hyperlipidemia, unspecified: Secondary | ICD-10-CM | POA: Diagnosis not present

## 2016-04-18 DIAGNOSIS — I10 Essential (primary) hypertension: Secondary | ICD-10-CM | POA: Diagnosis not present

## 2016-04-18 DIAGNOSIS — I82402 Acute embolism and thrombosis of unspecified deep veins of left lower extremity: Secondary | ICD-10-CM

## 2016-04-18 DIAGNOSIS — I5032 Chronic diastolic (congestive) heart failure: Secondary | ICD-10-CM | POA: Diagnosis not present

## 2016-04-18 DIAGNOSIS — F0391 Unspecified dementia with behavioral disturbance: Secondary | ICD-10-CM

## 2016-04-18 DIAGNOSIS — N289 Disorder of kidney and ureter, unspecified: Secondary | ICD-10-CM | POA: Diagnosis not present

## 2016-04-18 DIAGNOSIS — F03918 Unspecified dementia, unspecified severity, with other behavioral disturbance: Secondary | ICD-10-CM

## 2016-04-18 NOTE — Progress Notes (Signed)
Location:   [facility] Room Number: 122/D Place of Service:  SNF [id number]) Provider:  Leeanne Deed, MD  Patient Care Team: Hendricks Limes, MD as PCP - General (Internal Medicine)  Extended Emergency Contact Information Primary Emergency Contact: [name], [place] [address] [name] of [place] Phone: [phone] Relation: Daughter Secondary Emergency Contact: [name] States of [place] Phone: [phone] Relation: Son  Code Status:  DNR Goals of care: Advanced Directive information Advanced Directives 04/18/2016  Does patient have an advance directive? Yes  Type of Advance Directive Out of facility DNR (pink MOST or yellow form)  Does patient want to make changes to advanced directive? No - Patient declined  Copy of advanced directive(s) in chart? Yes  Pre-existing out of facility DNR order (yellow form or pink MOST form) -     Chief Complaint  Patient presents with  . Medical Management of Chronic Issues    Routine visit  Medical management of chronic medical issues including hypertension-mild dementia-history DVT-depression-hyperlipidemia-chronic kidney disease-diastolic CHF-GERD-  HPI:  Pt is a 82 y.o. female seen today for medical management of chronic diseases. As noted above.  She continues to be relatively stable at times she will complain of increased anxiety but per nursing she appears to be at baseline-she does not really appear to be overtly anxious today when I was in the room-she is on Ativan 0.25 mg twice a day. She also has a history of left leg DVT in continues on a liquid this is been stable she continues to have chronic lower extremity edema lymphedema-but this appears stable she actually received a short course of doxycycline recently for concerns of some increased erythema of the left leg but this appears to have resolved and back to baseline with her chronic venous stasis  changes.  She has a history of chronic diastolic CHF this is been stable her weight appears to be stable at 199 pounds she is on Lasix with potassium supplementation.  This is complicated somewhat with her history of renal insufficiency however recent creatinine of 1.28 BUN of 51 on 03/29/2016 show relatively stable results.  Hypertension continues to be somewhat of a variable issue-she is on hydralazine 25 mg twice a day a she was refusing to 3 times a day dosing and Dr. Linna Darner did changes to 25 mg twice a day which apparently she is compliant with.  She is also on Lotensin 40 mg a day and Norvasc 5 mg a day I got 150/70 manually this evening-history previous readings 118/57-140/66 158/57 and 110/64 I actually see a systolic of 97 as well.  It appears her blood pressure tends to go down with medication-at this point will monitor since I do not see consistent elevations.  In the past she has complained of difficulty swallowing she had a GI consult he ordered a CT scan of the abdomen did not show any acute process did show colonic diverticulosis without evidence of acute diverticulitis also a fibroid uterus.  Back in 2012 she did have an EGD basically showed age-related esophageal motility decline in no acute issues however.  She does have a history of dementia this appears to be mild she is doing quite well with supportive care-continues to be pleasant and interactive although anxious at times-she is on low-dose Aricept-she is also on Celexa for coexistent depression which appears to be stable    -     Past  Medical History:  Diagnosis Date  . Anxiety   . Cellulitis 2016  . CHF (congestive heart failure) ([place])   . Chronic kidney disease   . Dementia   . Dysphagia   . Gait abnormality   . GERD (gastroesophageal reflux disease)   . Glaucoma   . Gout   . Hyperlipidemia   . Hypertension   . Muscle weakness   . Myocardial infarction [facility]) [place]  . Osteoarthritis   .  Syncope    Past Surgical History:  Procedure Laterality Date  . APPENDECTOMY    . CATARACT EXTRACTION W/PHACO  08/04/2011   Procedure: CATARACT EXTRACTION PHACO AND INTRAOCULAR LENS PLACEMENT (IOC);  Surgeon: Tonny Branch;  Location: [facility];  Service: Ophthalmology;  Laterality: Right;  CDE=18.53  . CATARACT EXTRACTION W/PHACO  08/25/2011   Procedure: CATARACT EXTRACTION PHACO AND INTRAOCULAR LENS PLACEMENT (IOC);  Surgeon: Tonny Branch;  Location: [facility];  Service: Ophthalmology;  Laterality: Left;  CDE:14.76  . TONSILLECTOMY      Allergies  Allergen Reactions  . Bee Venom Anaphylaxis  . Aspirin     Directed by physician  . Cabbage Other (See Comments)    Certain foods due to gout flares  . Latex Rash    Current Outpatient Prescriptions on File Prior to Visit  Medication Sig Dispense Refill  . acetaminophen (TYLENOL) 500 MG tablet Take 1,000 mg by mouth 2 (two) times daily. For leg pain and arthritis. May have additional 1000 mg for break through pain    . amLODipine (NORVASC) 5 MG tablet Take 5 mg by mouth daily.    Marland Kitchen apixaban (ELIQUIS) 5 MG TABS tablet Take 5 mg by mouth 2 (two) times daily.    . benazepril (LOTENSIN) 40 MG tablet Take 40 mg by mouth daily.    . Brinzolamide-Brimonidine (SIMBRINZA) 1-0.2 % SUSP Apply to eye. One  drop into both eyes three times a day    . calcium carbonate (TUMS - DOSED IN MG ELEMENTAL CALCIUM) 500 MG chewable tablet Chew 1 tablet by mouth 3 (three) times daily. Take three times a day before meals    . citalopram (CELEXA) 20 MG tablet Take 20 mg by mouth daily.    . cycloSPORINE (RESTASIS) 0.05 % ophthalmic emulsion Place 1 drop into both eyes 2 (two) times daily.    Marland Kitchen donepezil (ARICEPT) 10 MG tablet Take 10 mg by mouth at bedtime. For dementia    . furosemide (LASIX) 40 MG tablet Take 40 mg by mouth daily.     . hydrALAZINE (APRESOLINE) 25 MG tablet Take 25 mg by mouth 2 (two) times daily.    Marland Kitchen loratadine (CLARITIN) 10 MG tablet Take 10 mg by mouth  daily.    Marland Kitchen LORazepam (ATIVAN) 0.5 MG tablet Take 0.25 mg by mouth 2 (two) times daily.     Marland Kitchen lovastatin (MEVACOR) 40 MG tablet Take 40 mg by mouth at bedtime.    . nitroGLYCERIN (NITROSTAT) 0.4 MG SL tablet Place 0.4 mg under the tongue every 5 (five) minutes as needed for chest pain.     . pantoprazole (PROTONIX) 40 MG tablet Take 40 mg by mouth daily.     Marland Kitchen POTASSIUM PO Take 10 meq by mouth daily    . Travoprost, BAK Free, (TRAVATAN) 0.004 % SOLN ophthalmic solution Place 1 drop into both eyes at bedtime. For glaucoma     No current facility-administered medications on file prior to visit.      Review of  Systems General no complaints of fever chills weight appears to be relatively stable. She has no complaints todayOther than stating she feels anxious at times-   Skin does not complain of rashes or itching.--At times will develop suspected lower extremity cellulitis has recently completed an antibiotic  Head ears eyes nose mouth and throat does not complain of any visual changes or sore throat she does have some history of allergic rhinitis.  Respiratory is not complaining of any increased shortness of breath beyond baseline does not complain of cough.  Cardiac does not complain of chest pain or palpitations chronic lower extremity edema-  GI does not complain of abdominal pain nausea vomiting diarrhea constipation.  Muscle skeletal does have a history of back pain and osteoarthritis but this appears to be fairly well controlled on Tylenol.-At times will complain of some right back pain is appears to be movement--t muscle related  Neurologic does not complain of dizziness headache or numbness.  Psych history of anxiety and depression and mood disorder this is been relatively stable today she does have increased anxiety. At times  Immunization History  Administered Date(s) Administered  . Influenza Whole 06/11/2007, 10/16/2008, 05/21/2009, 05/05/2010  .  Influenza-Unspecified 06/05/2014  . Pneumococcal Polysaccharide-23 06/11/2007  . Td 06/11/2007  . Zoster 05/21/2007   Pertinent  Health Maintenance Due  Topic Date Due  . INFLUENZA VACCINE  07/29/2016 (Originally 03/29/2016)  . PNA vac Low Risk Adult (2 of 2 - PCV13) 02/09/2017 (Originally 06/10/2008)  . DEXA SCAN  Completed   No flowsheet data found. Functional Status Survey:    Vitals:   04/18/16 1518  BP: (!) 97/53  Pulse: 74  Resp: 20  Temp: 97.4 F (36.3 C)  TempSrc: Oral  Weight: 199 lb (90.3 kg)  Height: 4\' 11"  (1.499 m)  She does have variable blood pressures as noted above manually was 150/70 today-but I see previous reading 118/57-158/57 110/64 Body mass index is 40.19 kg/m. Physical Exam Gen-l is a pleasant elderly  female in no distress.  Her skin is warm and dry. Venous stasis changes lower extremities bilaterally appear to be chronic I do not really see signs of cellulitis or increased erythema that is concerning  She does have venous stasis changes left greater than right lower extremities.  Eyes pupils appear equal round reactive light visual acuity appears grossly intact.  Oropharynx is clear mucous membranes moist.--She is edentulous  Chest -- clear to auscultation -- no labored breathing.  Heart is regular rate and rhythm without murmur gallop or rub she has baseline lower extremity venous stasis changes possibly 2-3+ left somewhat greater than right which is not new.  Abdomen is obese soft nontender positive bowel sounds.  Muscle skeletal moves all extremities at baseline is ambulating in a wheelchair-do not note any acute deformities she does have arthritic changes continues have some pain with palpitation of her legs but this is not new. Some mild tenderness to palpation of her back but this appears to be more a result of the invasive maneuver other than acute tenderness-again she is somewhat "jumpy"--with a history of anxiety  Neurologic  is grossly intact her speech is clear I don't see any focal weaknesses cranial nerves are grossly intact.  Psych she is oriented to self she is pleasant conversant i with some confusion at times -- dementia  continues to be somewhat mild. Labs reviewed:  Recent Labs  02/11/16 0740 03/16/16 0710 03/29/16 0700  NA 141 140 142  K 4.1 4.0 4.1  CL 107 105 110  CO2 27 26 25   GLUCOSE 92 93 102*  BUN 35* 42* 51*  CREATININE 1.21* 1.31* 1.28*  CALCIUM 9.2 9.0 9.5    Recent Labs  05/19/15 1945 10/08/15 0705 02/11/16 0740  AST 17 16 15   ALT 13* 12* 13*  ALKPHOS 130* 112 98  BILITOT 0.4 0.6 0.6  PROT 7.5 7.5 7.2  ALBUMIN 4.1 3.9 3.8    Recent Labs  02/11/16 0740 03/16/16 0710 03/29/16 0700  WBC 4.4 4.6 6.0  NEUTROABS 2.3 2.5 3.2  HGB 11.6* 11.6* 11.7*  HCT 36.6 36.1 36.2  MCV 85.7 86.2 85.6  PLT 205 203 206   Lab Results  Component Value Date   TSH 1.454 04/10/2015   Lab Results  Component Value Date   HGBA1C 6.0 (H) 03/09/2016   Lab Results  Component Value Date   CHOL 203 (H) 03/09/2016   HDL 101 03/09/2016   LDLCALC 93 03/09/2016   TRIG 45 03/09/2016   CHOLHDL 2.0 03/09/2016    Significant Diagnostic Results in last 30 days:  No results found.  Assessment/Plan #1 hypertension-Continues  to have quite a bit of variability on Lotensin and Norvasc as well as hydralazine with medication changes made proximally month ago by Dr. Drucilla Schmidt this point will monitor since she does not appear to have consistent elevated systolics   #2 dementia appears to be at baseline relatively mild she is on Aricept and doing well with supportive care.  #3 History of CHF edema-edema and weight appears to be stable at this point will continue current dose of Lasix-she also is on potassium supplementation  .  #4 history renal insufficiency-- she does have some chronic renal insufficiency creatinine of1.28 on lab done on 03/29/2016 shows relative stability we'll monitor at  periodic intervals  #5 history of dysphagia this appears stable on protonix  she has been followed by GI in the past.  #6 hyperlipidemia she is on Mevacor this appears to be looking quite well-she has a history of consistently high HDLs--on lab done last month July 2017 LDL was 73 HDL was 101-will monitor at periodic intervals   #7 depression this appears stable on Celexa her periods of anxiety appear to be fairly well controlled and not consistent-she is followed by the psychiatric nurse practitioner and she is on Ativan 0.25 twice a day--at times she will see me in the hallway complain of increased anxiety-but this does not really appear to be the case today.  #8 anemia suspect there is an element of chronic disease here hemoglobin 11.7 on lab done 03/29/2016 shows relative stability   #9 history of allergic rhinitis this appears stable she is on Claritin.  #10Gout--this has been stable for quite a while I suspect her leg pain is quite chronic this does not appear to be a gout presentation.  #11 history of left leg DVT this is stable--she continues on Eliquist--she has been on this approximately a year now-will  Discuss with Dr. Lyndel Safe when she is in the facility about any changes here   #12-back pain-this appears to be intermittent  ?? muscle related-not persistent at this point will monitor she does not really complain of any dysuria or burning with urination will see if this is persistent before ordering further studies-- does not appear to be affecting her ADLs or ambulatory status at this point--at this point would be has been to add a muscle relaxer to add to her medications since this does not appear to be  an acute situation at this time but again will have to be watched   [id number]-of note greater than 40 minutes spent assessing patient-discussing her concerns at bedside includes anxiety intermitted back pain-reviewing her chart-reviewing her labs-and coordinating formulating a  plan of care for numerous diagnoses-of note greater than 50% of time spent coordinating a plan of care  :

## 2016-05-16 ENCOUNTER — Non-Acute Institutional Stay (SKILLED_NURSING_FACILITY): Payer: Medicare Other | Admitting: Internal Medicine

## 2016-05-16 ENCOUNTER — Encounter: Payer: Self-pay | Admitting: Internal Medicine

## 2016-05-16 DIAGNOSIS — I10 Essential (primary) hypertension: Secondary | ICD-10-CM

## 2016-05-16 DIAGNOSIS — R05 Cough: Secondary | ICD-10-CM | POA: Diagnosis not present

## 2016-05-16 DIAGNOSIS — R059 Cough, unspecified: Secondary | ICD-10-CM

## 2016-05-16 NOTE — Progress Notes (Signed)
Location:   [facility] Room Number: 122/D Place of Service:  SNF [phone]) Provider:  Leeanne Deed, MD  Patient Care Team: Hendricks Limes, MD as PCP - General (Internal Medicine)  Extended Emergency Contact Information Primary Emergency Contact: [name], [place] [address] [name] of [place] Phone: [phone] Relation: Daughter Secondary Emergency Contact: [name] States of [place] Phone: [phone] Relation: Son  Code Status:  DNR Goals of care: Advanced Directive information Advanced Directives 04/18/2016  Does patient have an advance directive? Yes  Type of Advance Directive Out of facility DNR (pink MOST or yellow form)  Does patient want to make changes to advanced directive? No - Patient declined  Copy of advanced directive(s) in chart? Yes  Pre-existing out of facility DNR order (yellow form or pink MOST form) -     Chief Complaint  Patient presents with  . Acute Visit  Secondary to cough  HPI:  Pt is a 82 y.o. female seen today for an acute visit for for cough.  Patient has a productive cough apparently productive of clear phlegm per patient.  She does not complain of increased shortness of breath from baseline-appear she is eating fairly well actually was eating a hot and WHEN I was in the room later in the day.  Vital signs have been stable she is afebrile she does not report any fever or chills but she is quite concerned about her cough.  She does have an order for when necessary Robitussin   Past Medical History:  Diagnosis Date  . Anxiety   . Cellulitis 2016  . CHF (congestive heart failure) ([place])   . Chronic kidney disease   . Dementia   . Dysphagia   . Gait abnormality   . GERD (gastroesophageal reflux disease)   . Glaucoma   . Gout   . Hyperlipidemia   . Hypertension   . Muscle weakness   . Myocardial infarction [facility]) [place]  . Osteoarthritis    . Syncope    Past Surgical History:  Procedure Laterality Date  . APPENDECTOMY    . CATARACT EXTRACTION W/PHACO  08/04/2011   Procedure: CATARACT EXTRACTION PHACO AND INTRAOCULAR LENS PLACEMENT (IOC);  Surgeon: Tonny Branch;  Location: [facility];  Service: Ophthalmology;  Laterality: Right;  CDE=18.53  . CATARACT EXTRACTION W/PHACO  08/25/2011   Procedure: CATARACT EXTRACTION PHACO AND INTRAOCULAR LENS PLACEMENT (IOC);  Surgeon: Tonny Branch;  Location: [facility];  Service: Ophthalmology;  Laterality: Left;  CDE:14.76  . TONSILLECTOMY      Allergies  Allergen Reactions  . Bee Venom Anaphylaxis  . Aspirin     Directed by physician  . Cabbage Other (See Comments)    Certain foods due to gout flares  . Latex Rash    Current Outpatient Prescriptions on File Prior to Visit  Medication Sig Dispense Refill  . acetaminophen (TYLENOL) 500 MG tablet Take 1,000 mg by mouth 2 (two) times daily. For leg pain and arthritis. May have additional 1000 mg for break through pain    . amLODipine (NORVASC) 5 MG tablet Take 5 mg by mouth daily.    Marland Kitchen apixaban (ELIQUIS) 5 MG TABS tablet Take 5 mg by mouth 2 (two) times daily.    . benazepril (LOTENSIN) 40 MG tablet Take 40 mg by mouth daily.    . Brinzolamide-Brimonidine (SIMBRINZA) 1-0.2 % SUSP Apply to eye. One  drop  into both eyes three times a day    . calcium carbonate (TUMS - DOSED IN MG ELEMENTAL CALCIUM) 500 MG chewable tablet Chew 1 tablet by mouth 3 (three) times daily. Take three times a day before meals    . citalopram (CELEXA) 20 MG tablet Take 20 mg by mouth daily.    . cycloSPORINE (RESTASIS) 0.05 % ophthalmic emulsion Place 1 drop into both eyes 2 (two) times daily.    Marland Kitchen donepezil (ARICEPT) 10 MG tablet Take 10 mg by mouth at bedtime. For dementia    . furosemide (LASIX) 40 MG tablet Take 40 mg by mouth daily.     . hydrALAZINE (APRESOLINE) 25 MG tablet Take 25 mg by mouth 2 (two) times daily.    Marland Kitchen loratadine (CLARITIN) 10 MG tablet Take 10 mg by  mouth daily.    Marland Kitchen LORazepam (ATIVAN) 0.5 MG tablet Take 0.25 mg by mouth 2 (two) times daily.     Marland Kitchen lovastatin (MEVACOR) 40 MG tablet Take 40 mg by mouth at bedtime.    . nitroGLYCERIN (NITROSTAT) 0.4 MG SL tablet Place 0.4 mg under the tongue every 5 (five) minutes as needed for chest pain.     . pantoprazole (PROTONIX) 40 MG tablet Take 40 mg by mouth daily.     Marland Kitchen POTASSIUM PO Take 10 meq by mouth daily    . Travoprost, BAK Free, (TRAVATAN) 0.004 % SOLN ophthalmic solution Place 1 drop into both eyes at bedtime. For glaucoma     No current facility-administered medications on file prior to visit.     Review of Systems   General no complaints of fever chills weight appears to be relatively stable.   Skin does not complain of rashes or itching.--At times will develop suspected lower extremity cellulitis   Head ears eyes nose mouth and throat does not complain of any visual changes or sore throat she does have some history of allergic rhinitis. Does complain of watery discharge from her eyes  Respiratory is not complaining of any increased shortness of breath beyond baseline but has a cough apparently productive of clear phlegm  Cardiac does not complain of chest pain or palpitations  has chronic lower extremity edema-  GI does not complain of abdominal pain nausea vomiting diarrhea constipation.  Muscle skeletal does have a history of back pain and osteoarthritis but this appears to be fairly well controlled on Tylenol.-At times will complain of some right back pain is appears to be movement--t muscle related  Neurologic does not complain of dizziness or numbness.  Psych history of anxiety and depression and mood disorder this is been relatively stable today she does have increased anxiety. At times  Immunization History  Administered Date(s) Administered  . Influenza Whole 06/11/2007, 10/16/2008, 05/21/2009, 05/05/2010  . Influenza-Unspecified 06/05/2014  . Pneumococcal  Polysaccharide-23 06/11/2007  . Td 06/11/2007  . Zoster 05/21/2007   Pertinent  Health Maintenance Due  Topic Date Due  . INFLUENZA VACCINE  07/29/2016 (Originally 03/29/2016)  . PNA vac Low Risk Adult (2 of 2 - PCV13) 02/09/2017 (Originally 06/10/2008)  . DEXA SCAN  Completed   No flowsheet data found. Functional Status Survey:    Vitals:   05/16/16 1513  BP: (!) 155/63  Pulse: 78  She is afebrile O2 sats ration is in the 90s on room air respirations of 20  Physical Exam  Gen-l is a pleasant elderly female in no distress.  Her skin is warm and dry. Venous stasis changes lower extremities bilaterally  appear to be chronic I do not really see signs of cellulitis or increased erythema that is concerning  She does have venous stasis changes left greater than right lower extremities.  Eyes pupils appear equal round reactive light visual acuity appears grossly intact. She appears to have some watery drainage from her eyes bilaterally  Oropharynx is clear mucous membranes moist.--  Chest -- - no labored breathing.--No overt congestion but she does cough with any attempt at deep inspiration  Heart is regular rate and rhythm without murmur gallop or rub she has baseline lower extremity venous stasis changes possibly 2-3+ left somewhat greater than right which is not new.  Abdomen is obese soft nontender positive bowel sounds.  Muscle skeletal moves all extremities at baseline is ambulating in a wheelchair-do not note any acute deformities she does have arthritic changes continues have some pain with palpitation of her legs but this is not new.    Neurologic is grossly intact her speech is clear I don't see any focal weaknesses cranial nerves are grossly intact.  Psych she is oriented to self she is pleasant conversant i with some confusion at times -- dementia continues to be somewhat mild.   Labs reviewed:   Recent Labs  02/11/16 0740 03/16/16 0710 03/29/16 0700   NA 141 140 142  K 4.1 4.0 4.1  CL 107 105 110  CO2 27 26 25   GLUCOSE 92 93 102*  BUN 35* 42* 51*  CREATININE 1.21* 1.31* 1.28*  CALCIUM 9.2 9.0 9.5    Recent Labs  05/19/15 1945 10/08/15 0705 02/11/16 0740  AST 17 16 15   ALT 13* 12* 13*  ALKPHOS 130* 112 98  BILITOT 0.4 0.6 0.6  PROT 7.5 7.5 7.2  ALBUMIN 4.1 3.9 3.8    Recent Labs  02/11/16 0740 03/16/16 0710 03/29/16 0700  WBC 4.4 4.6 6.0  NEUTROABS 2.3 2.5 3.2  HGB 11.6* 11.6* 11.7*  HCT 36.6 36.1 36.2  MCV 85.7 86.2 85.6  PLT 205 203 206   Lab Results  Component Value Date   TSH 1.454 04/10/2015   Lab Results  Component Value Date   HGBA1C 6.0 (H) 03/09/2016   Lab Results  Component Value Date   CHOL 203 (H) 03/09/2016   HDL 101 03/09/2016   LDLCALC 93 03/09/2016   TRIG 45 03/09/2016   CHOLHDL 2.0 03/09/2016    Significant Diagnostic Results in last 30 days:  No results found.  Assessment/Plan  #1 cough-will hold Robitussin and start Mucinex 600 mg twice a day for 7 days-also obtain a two-view chest x-ray-.  Will order duo nebs every 6 hours routine for 48 hours and then when necessary-.  Also monitor vital signs pulse ox every shift for 48 hours to keep an ion her situation.  Of note she does have some history of chronic allergic rhinitis and continues on Claritin routinely  #2 hypertension-I do not really see consistent elevations here blood pressure systolically ranged from [id number] to 150s at this point will monitor X suspect anxiety may play a role at times of her systolic elevations she continues on hydralazine 25 mg twice a day Lotensin 40 mg daily and Norvasc 5 mg a day.  [place], [place], [place]

## 2016-05-20 ENCOUNTER — Non-Acute Institutional Stay (SKILLED_NURSING_FACILITY): Payer: Medicare Other | Admitting: Internal Medicine

## 2016-05-20 ENCOUNTER — Encounter: Payer: Self-pay | Admitting: Internal Medicine

## 2016-05-20 DIAGNOSIS — J209 Acute bronchitis, unspecified: Secondary | ICD-10-CM | POA: Diagnosis not present

## 2016-05-20 DIAGNOSIS — R05 Cough: Secondary | ICD-10-CM

## 2016-05-20 DIAGNOSIS — R059 Cough, unspecified: Secondary | ICD-10-CM

## 2016-05-20 DIAGNOSIS — J309 Allergic rhinitis, unspecified: Secondary | ICD-10-CM | POA: Diagnosis not present

## 2016-05-20 NOTE — Progress Notes (Signed)
Location:   [place] of Service:   SNF 31 Provider:  [place] Lewis  Unice Cobble, MD  Patient Care Team: Hendricks Limes, MD as PCP - General (Internal Medicine)  Extended Emergency Contact Information Primary Emergency Contact: [name], [place] [address] [name] of [place] Phone: [phone] Relation: Daughter Secondary Emergency Contact: [name] States of [place] Phone: [phone] Relation: Son  Code Status:  DNR Goals of care: Advanced Directive information Advanced Directives 05/20/2016  Does patient have an advance directive? Yes  Type of Advance Directive Out of facility DNR (pink MOST or yellow form)  Does patient want to make changes to advanced directive? No - Patient declined  Copy of advanced directive(s) in chart? Yes  Pre-existing out of facility DNR order (yellow form or pink MOST form) -     Chief Complaint  Patient presents with  . Acute Visit    cough     HPI:  Pt is a 82 y.o. female seen today for an acute visit for Follow-up of coug I saw her earlier this week for cough and did order a chest x-ray which is come back without any acute process apparent.  I also made her duo nebs routine every 6 hours for 72 hours as well as started her on Mucinex 6 mg twice a day which she continues on.  She continues to have a cough does not complain of increased shortness of breath from baseline but does appear to be somewhat uncomfortable with her cough.  At this point one would suspect possibly an element of bronchitis-vital signs have been stable blood pressure at times mildly elevated but this is not consistent.                      .     Past Medical History:  Diagnosis Date  . Anxiety   . Cellulitis 2016  . CHF (congestive heart failure) ([place])   . Chronic kidney disease   . Dementia   . Dysphagia   . Gait abnormality   . GERD (gastroesophageal reflux  disease)   . Glaucoma   . Gout   . Hyperlipidemia   . Hypertension   . Muscle weakness   . Myocardial infarction [facility]) [place]  . Osteoarthritis   . Syncope    Past Surgical History:  Procedure Laterality Date  . APPENDECTOMY    . CATARACT EXTRACTION W/PHACO  08/04/2011   Procedure: CATARACT EXTRACTION PHACO AND INTRAOCULAR LENS PLACEMENT (IOC);  Surgeon: Tonny Branch;  Location: [facility];  Service: Ophthalmology;  Laterality: Right;  CDE=18.53  . CATARACT EXTRACTION W/PHACO  08/25/2011   Procedure: CATARACT EXTRACTION PHACO AND INTRAOCULAR LENS PLACEMENT (IOC);  Surgeon: Tonny Branch;  Location: [facility];  Service: Ophthalmology;  Laterality: Left;  CDE:14.76  . TONSILLECTOMY      Allergies  Allergen Reactions  . Bee Venom Anaphylaxis  . Aspirin     Directed by physician  . Cabbage Other (See Comments)    Certain foods due to gout flares  . Latex Rash    Current Outpatient Prescriptions on File Prior to Visit  Medication Sig Dispense Refill  . acetaminophen (TYLENOL) 500 MG tablet Take 1,000 mg by mouth 2 (two) times daily. For leg pain and arthritis. May have additional 1000 mg for break through pain    . amLODipine (NORVASC) 5 MG tablet Take 5 mg  by mouth daily.    Marland Kitchen apixaban (ELIQUIS) 5 MG TABS tablet Take 5 mg by mouth 2 (two) times daily.    . benazepril (LOTENSIN) 40 MG tablet Take 40 mg by mouth daily.    . Brinzolamide-Brimonidine (SIMBRINZA) 1-0.2 % SUSP Apply to eye. One  drop into both eyes three times a day    . calcium carbonate (TUMS - DOSED IN MG ELEMENTAL CALCIUM) 500 MG chewable tablet Chew 1 tablet by mouth 3 (three) times daily. Take three times a day before meals    . citalopram (CELEXA) 20 MG tablet Take 20 mg by mouth daily.    . cycloSPORINE (RESTASIS) 0.05 % ophthalmic emulsion Place 1 drop into both eyes 2 (two) times daily.    Marland Kitchen donepezil (ARICEPT) 10 MG tablet Take 10 mg by mouth at bedtime. For dementia    . furosemide (LASIX) 40 MG tablet Give  40 mg only on day 3 and there after    . hydrALAZINE (APRESOLINE) 25 MG tablet Take 25 mg by mouth 2 (two) times daily.    Marland Kitchen loratadine (CLARITIN) 10 MG tablet Take 10 mg by mouth daily.    Marland Kitchen LORazepam (ATIVAN) 0.5 MG tablet Take 0.25 mg by mouth 2 (two) times daily.     Marland Kitchen lovastatin (MEVACOR) 40 MG tablet Take 40 mg by mouth at bedtime.    . nitroGLYCERIN (NITROSTAT) 0.4 MG SL tablet Place 0.4 mg under the tongue every 5 (five) minutes as needed for chest pain.     . pantoprazole (PROTONIX) 40 MG tablet Take 40 mg by mouth daily.     Marland Kitchen POTASSIUM PO Take 10 meq by mouth daily    . Travoprost, BAK Free, (TRAVATAN) 0.004 % SOLN ophthalmic solution Place 1 drop into both eyes at bedtime. For glaucoma     No current facility-administered medications on file prior to visit.   Note she has been started on one-week course of Mucinex which was started on 05/16/2016   Review of Systems General no complaints of fever chills continues to be bothered by a cough apparently productive of more clear whitish phlegm although this is somewhat unclear  Skin does not complain of rashes or itching.--At times will develop suspected lower extremity cellulitis   Head ears eyes nose mouth and throat does not complain of any visual changes or sore throat she does have some history of allergic rhinitis has had some increased drainage.   Respiratory is not complaining of any increased shortness of breath beyond baseline but has a cough that is quite persistent  Cardiac does not complain of chest pain or palpitations  has chronic lower extremity edema-  GI does not complain of abdominal pain nausea vomiting diarrhea constipation.  Muscle skeletal does have a history of back pain and osteoarthritis but this appears to be fairly well controlled on Tylenol.-At times will complain of some right back pain is appears to be movement--t muscle related  Neurologic does not complain of dizziness or numbness.  Psych  history of anxiety and depression and mood disorder this is been relatively stable today she does have increased anxiety.At times  Immunization History  Administered Date(s) Administered  . Influenza Whole 06/11/2007, 10/16/2008, 05/21/2009, 05/05/2010  . Influenza-Unspecified 06/05/2014  . Pneumococcal Polysaccharide-23 06/11/2007  . Td 06/11/2007  . Zoster 05/21/2007   Pertinent  Health Maintenance Due  Topic Date Due  . INFLUENZA VACCINE  07/29/2016 (Originally 03/29/2016)  . PNA vac Low Risk Adult (2 of 2 - PCV13)  02/09/2017 (Originally 06/10/2008)  . DEXA SCAN  Completed   No flowsheet data found. Functional Status Survey:    Vitals:   05/20/16 1253  BP: (!) 147/77  Pulse: 78  Resp: 20  Temp: 98.5 F (36.9 C)  TempSrc: Oral  SpO2: 96%   There is no height or weight on file to calculate BMI. Physical Exam  Gen-l is a pleasant elderly female in no distress.  Her skin is warm and dry.Venous stasis changes lower extremities bilaterally appear to be chronic I do not really see signs of cellulitis or increased erythema that is concerning  She does have venous stasis changes left greater than right lower extremities.  Eyes pupils appear equal round reactive light visual acuity appears grossly intact. She appears to have some watery drainage or eyes.  Nose also appears to have some clear drainage y  Oropharynx is clear mucous membranes moist.--  Chest --- no labored breathing.-Could not really appreciate any overt congestion but this was difficult secondary to patient coughing with any attempt at deep inspiration  Heart is regular rate and rhythm without murmur gallop or rub she has baseline lower extremity venous stasis changes possibly 2-3+ left somewhat greater than right which is not new.  Abdomen is obese soft nontender positive bowel sounds.  Muscle skeletal moves all extremities at baseline is ambulating in a wheelchair-do not note any acute  deformities she does have arthritic changes continues have some pain with palpitation of her legs but this is not new.    Neurologic is grossly intact her speech is clear I don't see any focal weaknesses cranial nerves are grossly intact.  Psych she is oriented to self she is pleasant conversant i with some confusion at times --dementia continues to be somewhat mild.    Labs reviewed:  05/16/2016.  WBC 6.6 hemoglobin 11.7 platelets 206  Recent Labs  02/11/16 0740 03/16/16 0710 03/29/16 0700  NA 141 140 142  K 4.1 4.0 4.1  CL 107 105 110  CO2 27 26 25   GLUCOSE 92 93 102*  BUN 35* 42* 51*  CREATININE 1.21* 1.31* 1.28*  CALCIUM 9.2 9.0 9.5    Recent Labs  10/08/15 0705 02/11/16 0740  AST 16 15  ALT 12* 13*  ALKPHOS 112 98  BILITOT 0.6 0.6  PROT 7.5 7.2  ALBUMIN 3.9 3.8    Recent Labs  02/11/16 0740 03/16/16 0710 03/29/16 0700  WBC 4.4 4.6 6.0  NEUTROABS 2.3 2.5 3.2  HGB 11.6* 11.6* 11.7*  HCT 36.6 36.1 36.2  MCV 85.7 86.2 85.6  PLT 205 203 206   Lab Results  Component Value Date   TSH 1.454 04/10/2015   Lab Results  Component Value Date   HGBA1C 6.0 (H) 03/09/2016   Lab Results  Component Value Date   CHOL 203 (H) 03/09/2016   HDL 101 03/09/2016   LDLCALC 93 03/09/2016   TRIG 45 03/09/2016   CHOLHDL 2.0 03/09/2016    Significant Diagnostic Results in last 30 days:  No results found.  Assessment/Plan #1-continued cough-one would be concerned about persistent bronchitis here will start her on Avelox 400 mg a day for 7 days-continue routine duo nebs every 6 hours for 72 additional hours then when necessary.  At his point she continues on Mucinex as well.  She is on Claritin with a history of allergic rhinitis will add Flonase 1 spray twice a day each nostril for 5 days to hopefully help with possible element of allergic rhinitis as well.  Continue to monitor vital signs with pulse ox every shift.  Also will update a CBC as well as  metabolic panel tomorrow.  [id number].      Oralia Manis, [place]

## 2016-05-21 ENCOUNTER — Encounter (HOSPITAL_COMMUNITY)
Admission: RE | Admit: 2016-05-21 | Discharge: 2016-05-21 | Disposition: A | Discharge status: Home / Self Care | Payer: Medicare Other | Source: Skilled Nursing Facility | Attending: Internal Medicine | Admitting: Internal Medicine

## 2016-05-21 DIAGNOSIS — F339 Major depressive disorder, recurrent, unspecified: Secondary | ICD-10-CM | POA: Insufficient documentation

## 2016-05-21 DIAGNOSIS — I509 Heart failure, unspecified: Secondary | ICD-10-CM | POA: Insufficient documentation

## 2016-05-21 DIAGNOSIS — N390 Urinary tract infection, site not specified: Secondary | ICD-10-CM | POA: Diagnosis present

## 2016-05-21 LAB — CBC WITH DIFFERENTIAL/PLATELET
BASOS PCT: 0 %
Basophils Absolute: 0 10*3/uL (ref 0.0–0.1)
EOS ABS: 0.2 10*3/uL (ref 0.0–0.7)
EOS PCT: 4 %
HEMATOCRIT: 32.8 % — AB (ref 36.0–46.0)
Hemoglobin: 10.4 g/dL — ABNORMAL LOW (ref 12.0–15.0)
Lymphocytes Relative: 30 %
Lymphs Abs: 1.8 10*3/uL (ref 0.7–4.0)
MCH: 28 pg (ref 26.0–34.0)
MCHC: 31.7 g/dL (ref 30.0–36.0)
MCV: 88.4 fL (ref 78.0–100.0)
MONO ABS: 0.9 10*3/uL (ref 0.1–1.0)
MONOS PCT: 14 %
Neutro Abs: 3.1 10*3/uL (ref 1.7–7.7)
Neutrophils Relative %: 52 %
PLATELETS: 208 10*3/uL (ref 150–400)
RBC: 3.71 MIL/uL — ABNORMAL LOW (ref 3.87–5.11)
RDW: 15.6 % — AB (ref 11.5–15.5)
WBC: 5.9 10*3/uL (ref 4.0–10.5)

## 2016-05-21 LAB — BASIC METABOLIC PANEL
Anion gap: 5 (ref 5–15)
BUN: 41 mg/dL — ABNORMAL HIGH (ref 6–20)
CALCIUM: 8.5 mg/dL — AB (ref 8.9–10.3)
CO2: 27 mmol/L (ref 22–32)
CREATININE: 1.47 mg/dL — AB (ref 0.44–1.00)
Chloride: 108 mmol/L (ref 101–111)
GFR, EST AFRICAN AMERICAN: 34 mL/min — AB (ref >60)
GFR, EST NON AFRICAN AMERICAN: 30 mL/min — AB (ref >60)
Glucose, Bld: 89 mg/dL (ref 65–99)
Potassium: 4.4 mmol/L (ref 3.5–5.1)
SODIUM: 140 mmol/L (ref 135–145)

## 2016-05-30 ENCOUNTER — Other Ambulatory Visit (HOSPITAL_COMMUNITY)
Admission: RE | Admit: 2016-05-30 | Discharge: 2016-05-30 | Disposition: A | Discharge status: Home / Self Care | Payer: Medicare Other | Source: Skilled Nursing Facility | Attending: Internal Medicine | Admitting: Internal Medicine

## 2016-05-30 DIAGNOSIS — I509 Heart failure, unspecified: Secondary | ICD-10-CM | POA: Insufficient documentation

## 2016-05-30 LAB — CBC WITH DIFFERENTIAL/PLATELET
BASOS PCT: 0 %
Basophils Absolute: 0 10*3/uL (ref 0.0–0.1)
EOS ABS: 0.2 10*3/uL (ref 0.0–0.7)
EOS PCT: 3 %
HEMATOCRIT: 36.9 % (ref 36.0–46.0)
Hemoglobin: 12 g/dL (ref 12.0–15.0)
Lymphocytes Relative: 31 %
Lymphs Abs: 1.9 10*3/uL (ref 0.7–4.0)
MCH: 27.9 pg (ref 26.0–34.0)
MCHC: 32.5 g/dL (ref 30.0–36.0)
MCV: 85.8 fL (ref 78.0–100.0)
MONO ABS: 0.8 10*3/uL (ref 0.1–1.0)
MONOS PCT: 13 %
NEUTROS ABS: 3.2 10*3/uL (ref 1.7–7.7)
Neutrophils Relative %: 53 %
Platelets: 210 10*3/uL (ref 150–400)
RBC: 4.3 MIL/uL (ref 3.87–5.11)
RDW: 15.3 % (ref 11.5–15.5)
WBC: 6 10*3/uL (ref 4.0–10.5)

## 2016-05-30 LAB — BASIC METABOLIC PANEL
Anion gap: 7 (ref 5–15)
BUN: 44 mg/dL — AB (ref 6–20)
CALCIUM: 9.3 mg/dL (ref 8.9–10.3)
CO2: 28 mmol/L (ref 22–32)
CREATININE: 1.41 mg/dL — AB (ref 0.44–1.00)
Chloride: 104 mmol/L (ref 101–111)
GFR calc non Af Amer: 31 mL/min — ABNORMAL LOW (ref >60)
GFR, EST AFRICAN AMERICAN: 36 mL/min — AB (ref >60)
Glucose, Bld: 96 mg/dL (ref 65–99)
Potassium: 4.2 mmol/L (ref 3.5–5.1)
Sodium: 139 mmol/L (ref 135–145)

## 2016-06-07 DIAGNOSIS — Z7901 Long term (current) use of anticoagulants: Secondary | ICD-10-CM | POA: Diagnosis not present

## 2016-06-07 DIAGNOSIS — Z791 Long term (current) use of non-steroidal anti-inflammatories (NSAID): Secondary | ICD-10-CM | POA: Diagnosis not present

## 2016-06-07 DIAGNOSIS — H04123 Dry eye syndrome of bilateral lacrimal glands: Secondary | ICD-10-CM | POA: Diagnosis not present

## 2016-06-07 DIAGNOSIS — H401134 Primary open-angle glaucoma, bilateral, indeterminate stage: Secondary | ICD-10-CM | POA: Diagnosis not present

## 2016-06-15 DIAGNOSIS — F0390 Unspecified dementia without behavioral disturbance: Secondary | ICD-10-CM | POA: Diagnosis not present

## 2016-06-15 DIAGNOSIS — F329 Major depressive disorder, single episode, unspecified: Secondary | ICD-10-CM | POA: Diagnosis not present

## 2016-06-15 DIAGNOSIS — F411 Generalized anxiety disorder: Secondary | ICD-10-CM | POA: Diagnosis not present

## 2016-06-21 ENCOUNTER — Non-Acute Institutional Stay (SKILLED_NURSING_FACILITY): Payer: Medicare Other | Admitting: Internal Medicine

## 2016-06-21 ENCOUNTER — Encounter: Payer: Self-pay | Admitting: Internal Medicine

## 2016-06-21 DIAGNOSIS — I82402 Acute embolism and thrombosis of unspecified deep veins of left lower extremity: Secondary | ICD-10-CM

## 2016-06-21 DIAGNOSIS — F0391 Unspecified dementia with behavioral disturbance: Secondary | ICD-10-CM

## 2016-06-21 DIAGNOSIS — I10 Essential (primary) hypertension: Secondary | ICD-10-CM | POA: Diagnosis not present

## 2016-06-21 DIAGNOSIS — N189 Chronic kidney disease, unspecified: Secondary | ICD-10-CM | POA: Insufficient documentation

## 2016-06-21 DIAGNOSIS — J309 Allergic rhinitis, unspecified: Secondary | ICD-10-CM | POA: Diagnosis not present

## 2016-06-21 DIAGNOSIS — N183 Chronic kidney disease, stage 3 unspecified: Secondary | ICD-10-CM

## 2016-06-21 DIAGNOSIS — L84 Corns and callosities: Secondary | ICD-10-CM | POA: Diagnosis not present

## 2016-06-21 NOTE — Progress Notes (Signed)
Location:   [facility] Room Number: 122/D Place of Service:  SNF (31) Provider:  Anjali,Gupta  No primary care provider on file.  Patient Care Team: [name], MD as Consulting Physician (Geriatric Medicine)  Extended Emergency Contact Information Primary Emergency Contact: [name], [place] [address] [name] of [place] Phone: [phone] Relation: Daughter Secondary Emergency Contact: [name] States of [place] Phone: [phone] Relation: Son  Code Status:  DNR Goals of care: Advanced Directive information Advanced Directives 06/21/2016  Does patient have an advance directive? Yes  Type of Advance Directive Out of facility DNR (pink MOST or yellow form)  Does patient want to make changes to advanced directive? No - Patient declined  Copy of advanced directive(s) in chart? Yes  Pre-existing out of facility DNR order (yellow form or pink MOST form) -     Chief Complaint  Patient presents with  . Medical Management of Chronic Issues    Routine Visit    HPI:  Pt is a 82 y.o. female seen today for medical management of chronic diseases. Patient has H/o Hypertension, Distal DVT, Chronic venous stasis, Diastolic CHF chronic Renal disaese and GERD. Patient recently was treated for Upper respiratory Tract Infection. Her Chest Xray was negative. She continues to complain of some cough with clear sputum. No SOB or Chest paint completely resolved. She also complains some pain in her left leg specially around the heels due to Dry skin.  Patient continues to have Hypertension with her Systolic BP usually running more then 150. She was started on 25 mg of Hydralazine few months ago. She is already on Norvasc and Lasix. She also have CRI with Creat of 1.4. Which is stable. It seems patient has been on Eliquis for distal DVT for now almost one year.  Past Medical History:  Diagnosis Date  . Anxiety   .  Cellulitis 2016  . CHF (congestive heart failure) ([place])   . Chronic kidney disease   . Dementia   . Dysphagia   . Gait abnormality   . GERD (gastroesophageal reflux disease)   . Glaucoma   . Gout   . Hyperlipidemia   . Hypertension   . Muscle weakness   . Myocardial infarction 1990s   [place]  . Osteoarthritis   . Syncope    Past Surgical History:  Procedure Laterality Date  . APPENDECTOMY    . CATARACT EXTRACTION W/PHACO  08/04/2011   Procedure: CATARACT EXTRACTION PHACO AND INTRAOCULAR LENS PLACEMENT (IOC);  Surgeon: Tonny Branch;  Location: [facility];  Service: Ophthalmology;  Laterality: Right;  CDE=18.53  . CATARACT EXTRACTION W/PHACO  08/25/2011   Procedure: CATARACT EXTRACTION PHACO AND INTRAOCULAR LENS PLACEMENT (IOC);  Surgeon: Tonny Branch;  Location: [facility];  Service: Ophthalmology;  Laterality: Left;  CDE:14.76  . TONSILLECTOMY      Allergies  Allergen Reactions  . Bee Venom Anaphylaxis  . Aspirin     Directed by physician  . Cabbage Other (See Comments)    Certain foods due to gout flares  . Latex Rash    Current Outpatient Prescriptions on File Prior to Visit  Medication Sig Dispense Refill  . acetaminophen (TYLENOL) 500 MG tablet Take 1,000 mg by mouth 2 (two) times daily. For leg pain and arthritis. May have additional 1000 mg for break through pain    . amLODipine (NORVASC) 5 MG tablet Take 5 mg by mouth daily.    Marland Kitchen  apixaban (ELIQUIS) 5 MG TABS tablet Take 5 mg by mouth 2 (two) times daily.    . benazepril (LOTENSIN) 40 MG tablet Take 40 mg by mouth daily.    . Brinzolamide-Brimonidine (SIMBRINZA) 1-0.2 % SUSP Apply to eye. One  drop into both eyes three times a day    . calcium carbonate (TUMS - DOSED IN MG ELEMENTAL CALCIUM) 500 MG chewable tablet Chew 1 tablet by mouth 3 (three) times daily. Take three times a day before meals    . citalopram (CELEXA) 20 MG tablet Take 20 mg by mouth daily.    . cycloSPORINE (RESTASIS) 0.05 % ophthalmic emulsion Place 1 drop  into both eyes 2 (two) times daily.    Marland Kitchen donepezil (ARICEPT) 10 MG tablet Take 10 mg by mouth at bedtime. For dementia    . furosemide (LASIX) 40 MG tablet Give 40 mg only on day 3 and there after    . hydrALAZINE (APRESOLINE) 25 MG tablet Take 25 mg by mouth 2 (two) times daily.    Marland Kitchen ipratropium-albuterol (DUONEB) 0.5-2.5 (3) MG/3ML SOLN Take 3 mLs by nebulization every 6 (six) hours as needed.    . loratadine (CLARITIN) 10 MG tablet Take 10 mg by mouth daily.    Marland Kitchen LORazepam (ATIVAN) 0.5 MG tablet Take 0.25 mg by mouth 2 (two) times daily.     Marland Kitchen lovastatin (MEVACOR) 40 MG tablet Take 40 mg by mouth at bedtime.    . nitroGLYCERIN (NITROSTAT) 0.4 MG SL tablet Place 0.4 mg under the tongue every 5 (five) minutes as needed for chest pain.     . pantoprazole (PROTONIX) 40 MG tablet Take 40 mg by mouth daily.     Marland Kitchen POTASSIUM PO Take 10 meq by mouth daily    . Travoprost, BAK Free, (TRAVATAN) 0.004 % SOLN ophthalmic solution Place 1 drop into both eyes at bedtime. For glaucoma     No current facility-administered medications on file prior to visit.     Review of Systems  Constitutional: Negative for appetite change, chills, diaphoresis, fatigue and fever.  HENT: Positive for congestion and postnasal drip.   Respiratory: Positive for cough. Negative for apnea, choking, chest tightness and shortness of breath.   Cardiovascular: Positive for leg swelling. Negative for chest pain and palpitations.  Gastrointestinal: Negative for abdominal distention, abdominal pain, blood in stool, constipation, diarrhea, nausea and rectal pain.  Musculoskeletal: Positive for arthralgias.  Skin: Positive for color change.  Neurological: Negative for dizziness, facial asymmetry, light-headedness and headaches.  Psychiatric/Behavioral: Negative for agitation, behavioral problems, confusion, decreased concentration and dysphoric mood.    Immunization History  Administered Date(s) Administered  . Influenza Whole  06/11/2007, 10/16/2008, 05/21/2009, 05/05/2010  . Influenza-Unspecified 06/05/2014, 06/02/2016  . Pneumococcal Polysaccharide-23 06/11/2007  . Pneumococcal-Unspecified 06/06/2016  . Td 06/11/2007  . Zoster 05/21/2007   Pertinent  Health Maintenance Due  Topic Date Due  . PNA vac Low Risk Adult (2 of 2 - PCV13) 06/06/2017  . INFLUENZA VACCINE  Completed  . DEXA SCAN  Completed   No flowsheet data found. Functional Status Survey:    There were no vitals filed for this visit. There is no height or weight on file to calculate BMI. Physical Exam  Constitutional: She is oriented to person, place, and time. She appears well-developed and well-nourished.  HENT:  Head: Normocephalic.  Mouth/Throat: Oropharynx is clear and moist.  Cardiovascular: Normal rate, regular rhythm and normal heart sounds.   Pulmonary/Chest: Effort normal and breath sounds normal. No respiratory  distress. She has no wheezes. She has no rales. She exhibits no tenderness.  Abdominal: Soft. Bowel sounds are normal. She exhibits no distension. There is no tenderness. There is no rebound.  Musculoskeletal:  B/L Edema with Chronic venous stasis in both lower extremity Left more then right. Dryness of heels.  Neurological: She is alert and oriented to person, place, and time.  Equal strength in all extremities.   Skin:  Very thick Dry Skin of both Feet.    Labs reviewed:  Recent Labs  03/29/16 0700 05/21/16 0615 05/30/16 0600  NA 142 140 139  K 4.1 4.4 4.2  CL 110 108 104  CO2 25 27 28   GLUCOSE 102* 89 96  BUN 51* 41* 44*  CREATININE 1.28* 1.47* 1.41*  CALCIUM 9.5 8.5* 9.3    Recent Labs  10/08/15 0705 02/11/16 0740  AST 16 15  ALT 12* 13*  ALKPHOS 112 98  BILITOT 0.6 0.6  PROT 7.5 7.2  ALBUMIN 3.9 3.8    Recent Labs  03/29/16 0700 05/21/16 0615 05/30/16 0600  WBC 6.0 5.9 6.0  NEUTROABS 3.2 3.1 3.2  HGB 11.7* 10.4* 12.0  HCT 36.2 32.8* 36.9  MCV 85.6 88.4 85.8  PLT 206 208 210   Lab  Results  Component Value Date   TSH 1.454 04/10/2015   Lab Results  Component Value Date   HGBA1C 6.0 (H) 03/09/2016   Lab Results  Component Value Date   CHOL 203 (H) 03/09/2016   HDL 101 03/09/2016   LDLCALC 93 03/09/2016   TRIG 45 03/09/2016   CHOLHDL 2.0 03/09/2016    Significant Diagnostic Results in last 30 days:  No results found.  Assessment/Plan  Essential hypertension  Will increase her Hydralazine to 50mg  BID. Continue Lasix and Amlodipine.   Deep vein thrombosis (DVT) of left lower extremity  Patient has been on Eliquis for proximal DVT almost 1 year. Discontinue Eliquis . Patient will be started on Aspirin. Her allergies more look like that she was intolerant. SO will try Baby aspirin.  Stage 3 chronic kidney disease  Creat Stable at 1.41. Continue to follow.  Chronic allergic rhinitis, with Cough Continue Claritin. And Mucinex PRN.  Dementia with behavioral disturbance Patient actually is doing really well. Continue supportive care . On Aricept and Ativan low dose.  B/L Calluses feet. Podiatry Consult. D/W Nurse patient does not usually agrees for this but will try again.      Family/ staff Communication:   Labs/tests ordered:

## 2016-06-23 ENCOUNTER — Encounter: Payer: Self-pay | Admitting: Internal Medicine

## 2016-06-23 ENCOUNTER — Non-Acute Institutional Stay (SKILLED_NURSING_FACILITY): Payer: Medicare Other | Admitting: Internal Medicine

## 2016-06-23 DIAGNOSIS — Z7901 Long term (current) use of anticoagulants: Secondary | ICD-10-CM

## 2016-06-23 DIAGNOSIS — R0789 Other chest pain: Secondary | ICD-10-CM

## 2016-06-23 NOTE — Progress Notes (Signed)
Location:   [facility] Room Number: 122/D Place of Service:  SNF (31) Provider:  Granville Lewis  No primary care provider on file.  Patient Care Team: [name], MD as Consulting Physician (Geriatric Medicine)  Extended Emergency Contact Information Primary Emergency Contact: [name], [place] [address] [name] of [place] Phone: [phone] Relation: Daughter Secondary Emergency Contact: [name] States of [place] Phone: [phone] Relation: Son  Code Status:  DNR Goals of care: Advanced Directive information Advanced Directives 06/23/2016  Does patient have an advance directive? Yes  Type of Advance Directive Out of facility DNR (pink MOST or yellow form)  Does patient want to make changes to advanced directive? No - Patient declined  Copy of advanced directive(s) in chart? Yes  Pre-existing out of facility DNR order (yellow form or pink MOST form) -     Chief Complaint  Patient presents with  . Acute Visit    c/o Chest pain  Follow-up anticoagulation issues  HPI:  Pt is a 82 y.o. female seen today for an acute visit for Complaints of chest pain-.  Also follow-up anticoagulation issues  Apparently earlier today she did complain to passing nursing tech that she was having chest pain-however now she denies chest pain-she does have a history of diastolic CHF and hypertension-and her hydralazine was recently increased-obtain 25 mg twice a day she is also on Lotensin 40 mg day and Norvasc 5 mg a day as well as Lasix   Systolic blood pressure is 149 today however she does at times have elevated systolics because of anxiety and I suspect this may be contributing to it since she also has normal blood pressures at times.  She is not complaining of any chest pain shortness of breath at this time I think this may have been more of a situational anxiety issue.  She also was seen by Dr.Gupta earlier this  week for a routine visit-patient had been on Eliquist for a distal DVT that was over a year ago.  This was discontinued in favor of baby aspirin.  Patient does have a listed allergy to aspirin but this was thought to be more of an intolerance.  However she is refusing the aspirin-she states years ago doctor told her not to take it although the reason for this is not clear-.  Currently she is sitting in her wheelchair comfortably and has no complaints     Past Medical History:  Diagnosis Date  . Anxiety   . Cellulitis 2016  . CHF (congestive heart failure) ([place])   . Chronic kidney disease   . Dementia   . Dysphagia   . Gait abnormality   . GERD (gastroesophageal reflux disease)   . Glaucoma   . Gout   . Hyperlipidemia   . Hypertension   . Muscle weakness   . Myocardial infarction 1990s   [place]  . Osteoarthritis   . Syncope    Past Surgical History:  Procedure Laterality Date  . APPENDECTOMY    . CATARACT EXTRACTION W/PHACO  08/04/2011   Procedure: CATARACT EXTRACTION PHACO AND INTRAOCULAR LENS PLACEMENT (IOC);  Surgeon: Tonny Branch;  Location: [facility];  Service: Ophthalmology;  Laterality: Right;  CDE=18.53  . CATARACT EXTRACTION W/PHACO  08/25/2011   Procedure: CATARACT EXTRACTION PHACO AND INTRAOCULAR LENS PLACEMENT (IOC);  Surgeon: Tonny Branch;  Location: [facility];  Service: Ophthalmology;  Laterality: Left;  CDE:14.76  . TONSILLECTOMY  Allergies  Allergen Reactions  . Bee Venom Anaphylaxis  . Aspirin     Directed by physician  . Cabbage Other (See Comments)    Certain foods due to gout flares  . Latex Rash   Current Outpatient Prescriptions on File Prior to Visit  Medication Sig Dispense Refill  . acetaminophen (TYLENOL) 500 MG tablet Take 1,000 mg by mouth 2 (two) times daily. For leg pain and arthritis. May have additional 1000 mg for break through pain    . amLODipine (NORVASC) 5 MG tablet Take 5 mg by mouth daily.    . benazepril (LOTENSIN) 40 MG tablet  Take 40 mg by mouth daily.    . Brinzolamide-Brimonidine (SIMBRINZA) 1-0.2 % SUSP Apply to eye. One  drop into both eyes three times a day    . citalopram (CELEXA) 20 MG tablet Take 20 mg by mouth daily.    . cycloSPORINE (RESTASIS) 0.05 % ophthalmic emulsion Place 1 drop into both eyes 2 (two) times daily.    Marland Kitchen donepezil (ARICEPT) 10 MG tablet Take 10 mg by mouth at bedtime. For dementia    . furosemide (LASIX) 40 MG tablet Give 40 mg only on day 3 and there after    . hydrALAZINE (APRESOLINE) 25 MG tablet Take 25 mg by mouth 2 (two) times daily.    Marland Kitchen ipratropium-albuterol (DUONEB) 0.5-2.5 (3) MG/3ML SOLN Take 3 mLs by nebulization every 6 (six) hours as needed.    . loratadine (CLARITIN) 10 MG tablet Take 10 mg by mouth daily.    Marland Kitchen LORazepam (ATIVAN) 0.5 MG tablet Take 0.25 mg by mouth 2 (two) times daily.     Marland Kitchen lovastatin (MEVACOR) 40 MG tablet Take 40 mg by mouth at bedtime.    . nitroGLYCERIN (NITROSTAT) 0.4 MG SL tablet Place 0.4 mg under the tongue every 5 (five) minutes as needed for chest pain.     . pantoprazole (PROTONIX) 40 MG tablet Take 40 mg by mouth daily.     Marland Kitchen POTASSIUM PO Take 10 meq by mouth daily    . Travoprost, BAK Free, (TRAVATAN) 0.004 % SOLN ophthalmic solution Place 1 drop into both eyes at bedtime. For glaucoma     No current facility-administered medications on file prior to visit.     Review of Systems   In general she is not complaining of any fever or chills at this point does not complain of pain except for leg pain which is chronic when her legs are touched.  Skin is not complaining of any diaphoresis.  Head ears eyes nose mouth and throat at this point is not complaining of any visual changes or sore throat does have a history of nasal drip at times.  Respiratory is not complaining of shortness breath or cough at this time although at times she will complain of this.  Cardiac is not complaining of any chest pain does have chronic venous stasis changes  edema lower extremities.  GI is not complaining of abdominal pain nausea vomiting diarrhea constipation.  Muscle skeletal does have complaints of chronic intermittent leg pain especially when her legs are touched this has not worsened.  Neurologic is not complaining at this time dizziness headache or syncope.  Psych does have a history of anxiety  Immunization History  Administered Date(s) Administered  . Influenza Whole 06/11/2007, 10/16/2008, 05/21/2009, 05/05/2010  . Influenza-Unspecified 06/05/2014, 06/02/2016  . Pneumococcal Polysaccharide-23 06/11/2007  . Pneumococcal-Unspecified 06/06/2016  . Td 06/11/2007  . Zoster 05/21/2007   Pertinent  Health  Maintenance Due  Topic Date Due  . PNA vac Low Risk Adult (2 of 2 - PCV13) 06/06/2017  . INFLUENZA VACCINE  Completed  . DEXA SCAN  Completed   No flowsheet data found. Functional Status Survey:    Vitals:   06/23/16 1557  BP: (!) 149/67  Pulse: 72  She is afebrile respirations are 18  Physical Exam   In general this is a pleasant elderly female in no distress sitting comfortably in her wheelchair.  Her skin is warm and dry she does have venous stasis changes which are chronic of her lower extremities.  Eyes pupils appear reactive light sclerae and infarct clear visual acuity appears grossly intact.  Oropharynx clear mucous membranes moist.  Chest is largely clear to auscultation there are scattered bronchial sounds-no labored breathing.  Heart is regular rate and rhythm without murmur gallop or rub she has chronic venous stasis changes lower extremities.  Abdomen is obese soft nontender with positive bowel sounds.  Muscle skeletal ambulates in a wheelchair moves her extremities at baseline with lower extremity weakness does have baseline edema on her lower extremities with venous stasis.  Neurologic is grossly intact her speech is clear no lateralizing findings.  Psych she is pleasant and conversant a little bit  anxious which is her baseline  Labs reviewed:  Recent Labs  03/29/16 0700 05/21/16 0615 05/30/16 0600  NA 142 140 139  K 4.1 4.4 4.2  CL 110 108 104  CO2 25 27 28   GLUCOSE 102* 89 96  BUN 51* 41* 44*  CREATININE 1.28* 1.47* 1.41*  CALCIUM 9.5 8.5* 9.3    Recent Labs  10/08/15 0705 02/11/16 0740  AST 16 15  ALT 12* 13*  ALKPHOS 112 98  BILITOT 0.6 0.6  PROT 7.5 7.2  ALBUMIN 3.9 3.8    Recent Labs  03/29/16 0700 05/21/16 0615 05/30/16 0600  WBC 6.0 5.9 6.0  NEUTROABS 3.2 3.1 3.2  HGB 11.7* 10.4* 12.0  HCT 36.2 32.8* 36.9  MCV 85.6 88.4 85.8  PLT 206 208 210   Lab Results  Component Value Date   TSH 1.454 04/10/2015   Lab Results  Component Value Date   HGBA1C 6.0 (H) 03/09/2016   Lab Results  Component Value Date   CHOL 203 (H) 03/09/2016   HDL 101 03/09/2016   LDLCALC 93 03/09/2016   TRIG 45 03/09/2016   CHOLHDL 2.0 03/09/2016    Significant Diagnostic Results in last 30 days:  No results found.  Assessment/Plan #1-chest pain-by the time I saw her she was not complaining of any chest pain-I suspect this was more anxiety related-she is not complaining of any shortness of breath syncope or pain at this time at this point will monitor-systolic blood pressure somewhat elevated but this happens often when she is anxious will monitor and continue current medications her hydralazine as noted above was just increased she is also on Lotensin as well as Norvasc.  In regards diastolic CHF this appears to be stable on Lasix as well.  #2 in regards anticoagulation issues as noted above she is refusing the baby aspirin stating doctor told her never to take aspirin although the reason for this is quite unclear-at this point will monitor will continue to encourage this although I suspect it will be a challenge--  [place], [place], [place]

## 2016-07-14 ENCOUNTER — Non-Acute Institutional Stay (SKILLED_NURSING_FACILITY): Payer: Medicare Other | Admitting: Internal Medicine

## 2016-07-14 ENCOUNTER — Encounter: Payer: Self-pay | Admitting: Internal Medicine

## 2016-07-14 DIAGNOSIS — F411 Generalized anxiety disorder: Secondary | ICD-10-CM

## 2016-07-14 DIAGNOSIS — F329 Major depressive disorder, single episode, unspecified: Secondary | ICD-10-CM

## 2016-07-14 DIAGNOSIS — F32A Depression, unspecified: Secondary | ICD-10-CM

## 2016-07-14 DIAGNOSIS — I10 Essential (primary) hypertension: Secondary | ICD-10-CM

## 2016-07-14 DIAGNOSIS — N183 Chronic kidney disease, stage 3 unspecified: Secondary | ICD-10-CM

## 2016-07-14 IMAGING — DX DG CHEST 1V
1 series · 1 of 1 positions shown · non-contrast
Comparison: 07/15/2015

CLINICAL DATA: Productive cough and congestion for 2 weeks

EXAM:
CHEST 1 VIEW

[chest ap]
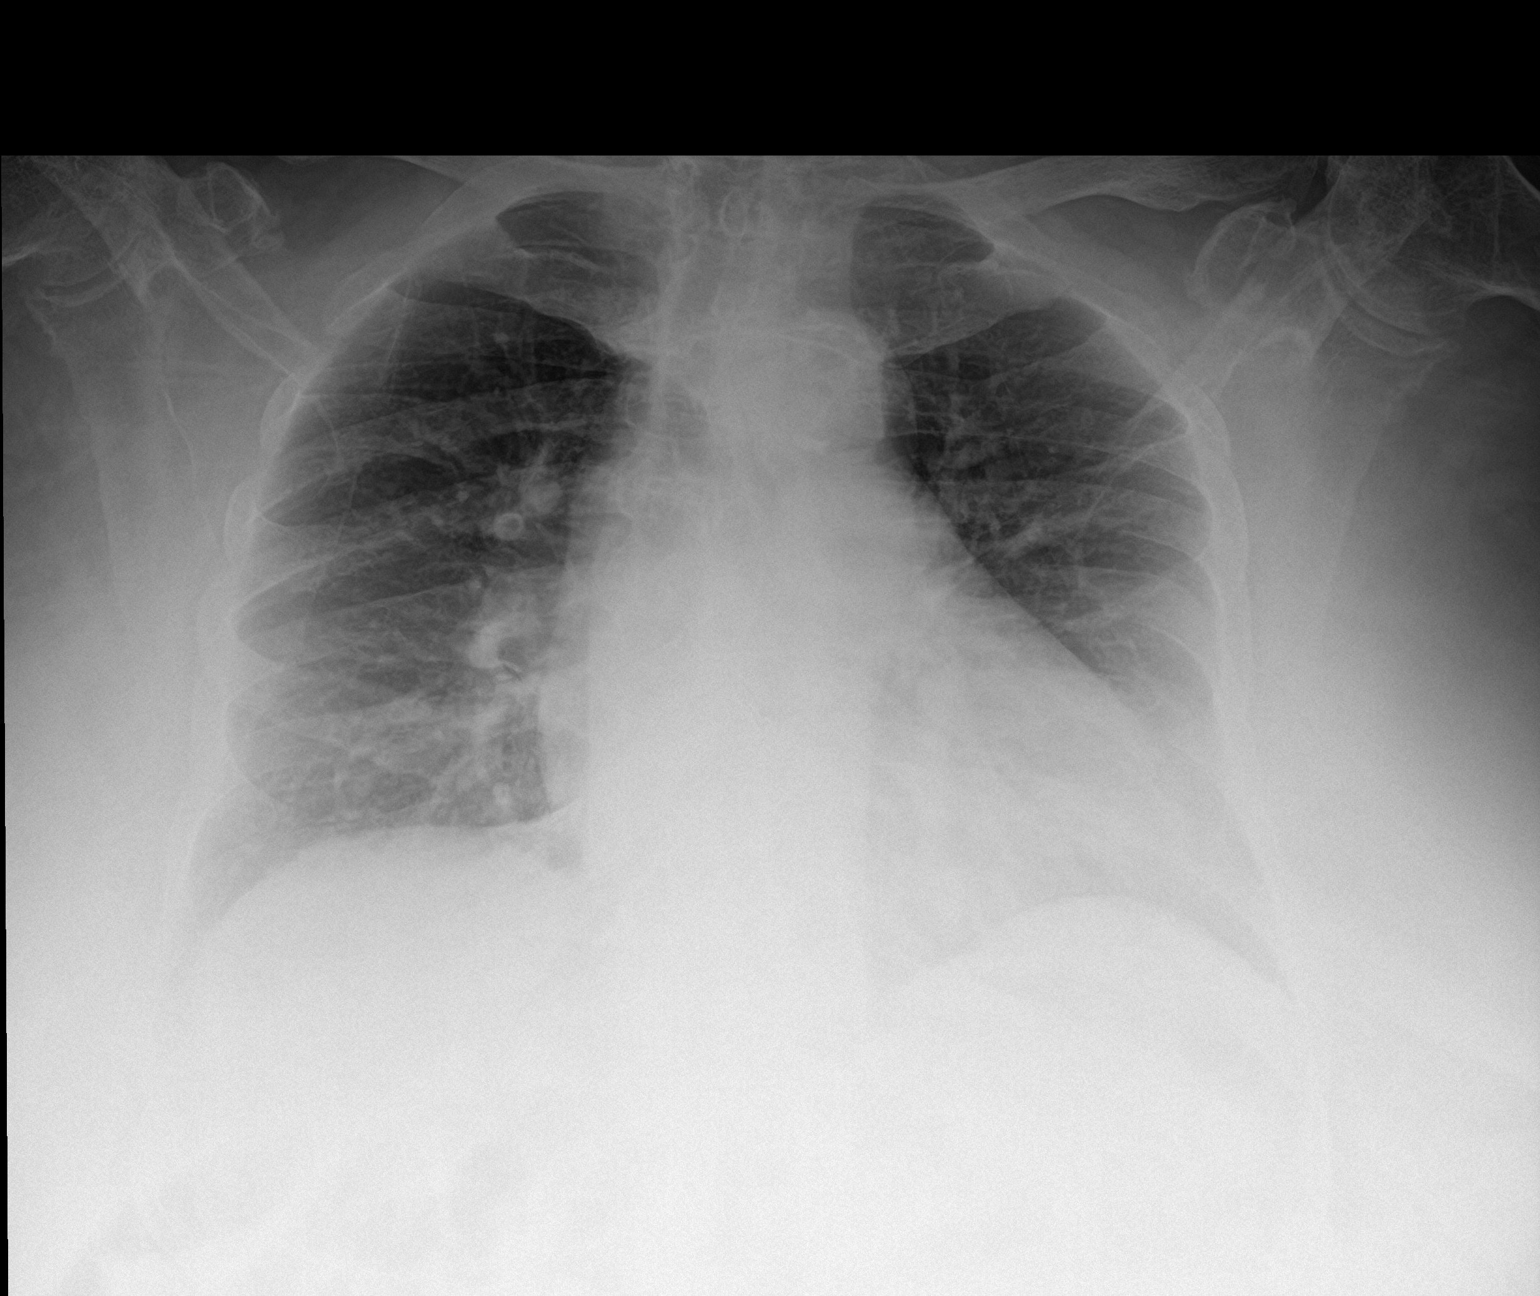

[1 of 1 positions shown; findings below may reference images not displayed]

FINDINGS: Moderate cardiomegaly. Lungs under aerated with left perihilar
subsegmental atelectasis. No pneumothorax. No pleural effusion. No
interstitial edema.
IMPRESSION: No active disease.

## 2016-07-14 NOTE — Progress Notes (Signed)
Location:   [facility] Room Number: 122/D Place of Service:  SNF (31) Provider:  Nery Frappier,Jari Carollo  No primary care provider on file.  Patient Care Team: [name], MD as Consulting Physician (Geriatric Medicine)  Extended Emergency Contact Information Primary Emergency Contact: [name], [place] [address] [name] of [place] Phone: [phone] Relation: Daughter Secondary Emergency Contact: [name] States of [place] Phone: [phone] Relation: Son  Code Status:  DNR Goals of care: Advanced Directive information Advanced Directives 07/14/2016  Does patient have an advance directive? Yes  Type of Advance Directive Out of facility DNR (pink MOST or yellow form)  Does patient want to make changes to advanced directive? No - Patient declined  Copy of advanced directive(s) in chart? Yes  Pre-existing out of facility DNR order (yellow form or pink MOST form) -     Chief Complaint  Patient presents with  . Acute Visit    Elevated B/P    HPI:  Pt is a 82 y.o. female seen today for an acute visit for Hypertension.  Patient has H/o Hypertension, Distal DVT, Chronic venous stasis, Diastolic CHF chronic Renal disaese and GERD. Her Hydralazine was recently increased to 50 mg from 25 mg. Patient continues to run systolic between [id number] and this morning it was 176/88. I repeated with manual Cuff and it was 176/96. Patient denies any chest pain , SOB, headaches. She does say that she wants something for anxiety as the medicines they are using is not helping her enough. Patient is also on Lisinopril, Lasix and Norvasc. Past Medical History:  Diagnosis Date  . Anxiety   . Cellulitis 2016  . CHF (congestive heart failure) ([place])   . Chronic kidney disease   . Dementia   . Dysphagia   . Gait abnormality   . GERD (gastroesophageal reflux disease)   . Glaucoma   . Gout   . Hyperlipidemia   . Hypertension   .  Muscle weakness   . Myocardial infarction 1990s   [place]  . Osteoarthritis   . Syncope    Past Surgical History:  Procedure Laterality Date  . APPENDECTOMY    . CATARACT EXTRACTION W/PHACO  08/04/2011   Procedure: CATARACT EXTRACTION PHACO AND INTRAOCULAR LENS PLACEMENT (IOC);  Surgeon: Tonny Branch;  Location: [facility];  Service: Ophthalmology;  Laterality: Right;  CDE=18.53  . CATARACT EXTRACTION W/PHACO  08/25/2011   Procedure: CATARACT EXTRACTION PHACO AND INTRAOCULAR LENS PLACEMENT (IOC);  Surgeon: Tonny Branch;  Location: [facility];  Service: Ophthalmology;  Laterality: Left;  CDE:14.76  . TONSILLECTOMY      Allergies  Allergen Reactions  . Bee Venom Anaphylaxis  . Aspirin     Directed by physician  . Cabbage Other (See Comments)    Certain foods due to gout flares  . Latex Rash    Current Outpatient Prescriptions on File Prior to Visit  Medication Sig Dispense Refill  . acetaminophen (TYLENOL) 500 MG tablet Take 1,000 mg by mouth 2 (two) times daily. For leg pain and arthritis. May have additional 1000 mg for break through pain    . amLODipine (NORVASC) 5 MG tablet Take 5 mg by mouth daily.    . benazepril (LOTENSIN) 40 MG tablet Take 40 mg by mouth daily.    . Brinzolamide-Brimonidine (SIMBRINZA) 1-0.2 % SUSP Apply to eye. One  drop into both eyes three times a day    . Calcium Carbonate-Vitamin D (  CALCIUM-VITAMIN D) 500-200 MG-UNIT tablet Take 1 tablet by mouth 2 (two) times daily.    . citalopram (CELEXA) 20 MG tablet Take 20 mg by mouth daily.    . cycloSPORINE (RESTASIS) 0.05 % ophthalmic emulsion Place 1 drop into both eyes 2 (two) times daily.    Marland Kitchen donepezil (ARICEPT) 10 MG tablet Take 10 mg by mouth at bedtime. For dementia    . furosemide (LASIX) 40 MG tablet Give 40 mg only on day 3 and there after    . hydrALAZINE (APRESOLINE) 25 MG tablet Take 25 mg by mouth 2 (two) times daily.    Marland Kitchen ipratropium-albuterol (DUONEB) 0.5-2.5 (3) MG/3ML SOLN Take 3 mLs by nebulization  every 6 (six) hours as needed.    . loratadine (CLARITIN) 10 MG tablet Take 10 mg by mouth daily.    Marland Kitchen LORazepam (ATIVAN) 0.5 MG tablet Take 0.25 mg by mouth 2 (two) times daily.     Marland Kitchen lovastatin (MEVACOR) 40 MG tablet Take 40 mg by mouth at bedtime.    . nitroGLYCERIN (NITROSTAT) 0.4 MG SL tablet Place 0.4 mg under the tongue every 5 (five) minutes as needed for chest pain.     . pantoprazole (PROTONIX) 40 MG tablet Take 40 mg by mouth daily.     Marland Kitchen POTASSIUM PO Take 10 meq by mouth daily    . Travoprost, BAK Free, (TRAVATAN) 0.004 % SOLN ophthalmic solution Place 1 drop into both eyes at bedtime. For glaucoma     No current facility-administered medications on file prior to visit.      Review of Systems  Constitutional: Negative for activity change, appetite change, chills, diaphoresis, fatigue and fever.  HENT: Negative for congestion.   Respiratory: Negative for apnea, cough, choking, chest tightness and shortness of breath.   Cardiovascular: Positive for leg swelling. Negative for chest pain and palpitations.  Gastrointestinal: Negative for abdominal distention, abdominal pain, constipation, diarrhea and nausea.  Musculoskeletal: Positive for myalgias. Negative for arthralgias, back pain, gait problem and joint swelling.  Psychiatric/Behavioral: Positive for dysphoric mood. Negative for behavioral problems and confusion. The patient is nervous/anxious.     Immunization History  Administered Date(s) Administered  . Influenza Whole 06/11/2007, 10/16/2008, 05/21/2009, 05/05/2010  . Influenza-Unspecified 06/05/2014, 06/02/2016  . Pneumococcal Polysaccharide-23 06/11/2007  . Pneumococcal-Unspecified 06/06/2016  . Td 06/11/2007  . Zoster 05/21/2007   Pertinent  Health Maintenance Due  Topic Date Due  . PNA vac Low Risk Adult (2 of 2 - PCV13) 06/06/2017  . INFLUENZA VACCINE  Completed  . DEXA SCAN  Completed   No flowsheet data found. Functional Status Survey:    Vitals:    07/14/16 0942  BP: (!) 174/88  Pulse: 65  Resp: 20  Temp: 97.9 F (36.6 C)  TempSrc: Oral  SpO2: 94%   There is no height or weight on file to calculate BMI. Physical Exam  Constitutional: She appears well-developed and well-nourished.  HENT:  Head: Normocephalic.  Mouth/Throat: Oropharynx is clear and moist.  Cardiovascular: Normal rate, regular rhythm and normal heart sounds.   Pulmonary/Chest: Effort normal and breath sounds normal. No respiratory distress. She has no wheezes. She has no rales.  Abdominal: Soft. Bowel sounds are normal. She exhibits no distension. There is no tenderness. There is no rebound.  Musculoskeletal: She exhibits edema.  Neurological: She is alert.  Psychiatric: She has a normal mood and affect. Her behavior is normal.    Labs reviewed:  Recent Labs  03/29/16 0700 05/21/16 0615 05/30/16 0600  NA 142 140 139  K 4.1 4.4 4.2  CL 110 108 104  CO2 25 27 28   GLUCOSE 102* 89 96  BUN 51* 41* 44*  CREATININE 1.28* 1.47* 1.41*  CALCIUM 9.5 8.5* 9.3    Recent Labs  10/08/15 0705 02/11/16 0740  AST 16 15  ALT 12* 13*  ALKPHOS 112 98  BILITOT 0.6 0.6  PROT 7.5 7.2  ALBUMIN 3.9 3.8    Recent Labs  03/29/16 0700 05/21/16 0615 05/30/16 0600  WBC 6.0 5.9 6.0  NEUTROABS 3.2 3.1 3.2  HGB 11.7* 10.4* 12.0  HCT 36.2 32.8* 36.9  MCV 85.6 88.4 85.8  PLT 206 208 210   Lab Results  Component Value Date   TSH 1.454 04/10/2015   Lab Results  Component Value Date   HGBA1C 6.0 (H) 03/09/2016   Lab Results  Component Value Date   CHOL 203 (H) 03/09/2016   HDL 101 03/09/2016   LDLCALC 93 03/09/2016   TRIG 45 03/09/2016   CHOLHDL 2.0 03/09/2016    Significant Diagnostic Results in last 30 days:  No results found.  Assessment/Plan  Essential hypertension Patient is on 3 antihypertensive and lasix.. Will increase her Hydralazine to 50 mg TID. Patient has refused to take 3 doses in the day bit will try again and see if she  agrees.  Stage 3 chronic kidney disease Her Creat is 1.41 which is close to her baseline   Depression with Anxiety Patient is on Celexa and low dose ativan. Will continue same She is followed by Psych services.     Family/ staff Communication:   Labs/tests ordered:

## 2016-07-20 ENCOUNTER — Ambulatory Visit (HOSPITAL_COMMUNITY)
Admission: RE | Admit: 2016-07-20 | Discharge: 2016-07-20 | Disposition: A | Discharge status: Home / Self Care | Payer: Medicare Other | Source: Ambulatory Visit | Attending: Radiation Oncology | Admitting: Radiation Oncology

## 2016-07-20 ENCOUNTER — Encounter: Payer: Self-pay | Admitting: Internal Medicine

## 2016-07-20 ENCOUNTER — Non-Acute Institutional Stay (SKILLED_NURSING_FACILITY): Payer: Medicare Other | Admitting: Internal Medicine

## 2016-07-20 DIAGNOSIS — Z86718 Personal history of other venous thrombosis and embolism: Secondary | ICD-10-CM | POA: Diagnosis not present

## 2016-07-20 DIAGNOSIS — I10 Essential (primary) hypertension: Secondary | ICD-10-CM

## 2016-07-20 DIAGNOSIS — R609 Edema, unspecified: Secondary | ICD-10-CM | POA: Diagnosis not present

## 2016-07-20 DIAGNOSIS — M7989 Other specified soft tissue disorders: Secondary | ICD-10-CM | POA: Diagnosis not present

## 2016-07-20 NOTE — Progress Notes (Signed)
Location:   [facility] Room Number: 122/D Place of Service:  SNF (31) Provider:  Granville Lewis  No primary care provider on file.  Patient Care Team: [name], MD as Consulting Physician (Geriatric Medicine)  Extended Emergency Contact Information Primary Emergency Contact: [name], [place] [address] [name] of [place] Phone: [phone] Relation: Daughter Secondary Emergency Contact: [name] States of [place] Phone: [phone] Relation: Son  Code Status:  DNR Goals of care: Advanced Directive information Advanced Directives 07/20/2016  Does Patient Have a Medical Advance Directive? Yes  Type of Advance Directive Out of facility DNR (pink MOST or yellow form)  Does patient want to make changes to medical advance directive? No - Patient declined  Copy of [place] in Chart? -  Pre-existing out of facility DNR order (yellow form or pink MOST form) -     Chief Complaint  Patient presents with  . Acute Visit    Edema in both Legs    HPI:  Pt is a 82 y.o. female seen today for an acute visit for Complaints of edema.  She does have a history of grade 1 diastolic CHF and chronic edema which appears to worsen when she has her legs in a dependent position for an extended period of time  Most recent weight of 192 actually appears to be down about 4 pounds over the past month.  Patient today is complaining that she feels she's having increased edema however.  She is on Lasix 40 mg a day with potassium supplementation.  She's also recently had her hydralazine decreased secondary to hypertension she is currently on hydralazine 50 mg 3 times a day she is also on Lotensin 40 mg a day and Norvasc 5 mg a day.  Blood pressure listed today is 167/65-previous ones 142/58 119/61-146/51 there appears to be some variability but this appears to be trending down somewhat from when the  hydralazine was at a lower dose        Past Medical History:  Diagnosis Date  . Anxiety   . Cellulitis 2016  . CHF (congestive heart failure) ([facility])   . Chronic kidney disease   . Dementia   . Dysphagia   . Gait abnormality   . GERD (gastroesophageal reflux disease)   . Glaucoma   . Gout   . Hyperlipidemia   . Hypertension   . Muscle weakness   . Myocardial infarction 1990s   [place]  . Osteoarthritis   . Syncope    Past Surgical History:  Procedure Laterality Date  . APPENDECTOMY    . CATARACT EXTRACTION W/PHACO  08/04/2011   Procedure: CATARACT EXTRACTION PHACO AND INTRAOCULAR LENS PLACEMENT (IOC);  Surgeon: Tonny Branch;  Location: [facility];  Service: Ophthalmology;  Laterality: Right;  CDE=18.53  . CATARACT EXTRACTION W/PHACO  08/25/2011   Procedure: CATARACT EXTRACTION PHACO AND INTRAOCULAR LENS PLACEMENT (IOC);  Surgeon: Tonny Branch;  Location: [facility];  Service: Ophthalmology;  Laterality: Left;  CDE:14.76  . TONSILLECTOMY      Allergies  Allergen Reactions  . Bee Venom Anaphylaxis  . Aspirin     Directed by physician  . Cabbage Other (See Comments)    Certain foods due to gout flares  . Latex Rash    Current Outpatient Prescriptions on File Prior to Visit  Medication Sig Dispense Refill  . acetaminophen (TYLENOL) 500 MG tablet Take 1,000 mg by mouth 2 (  two) times daily. For leg pain and arthritis. May have additional 1000 mg for break through pain    . amLODipine (NORVASC) 5 MG tablet Take 5 mg by mouth daily.    . benazepril (LOTENSIN) 40 MG tablet Take 40 mg by mouth daily.    . Brinzolamide-Brimonidine (SIMBRINZA) 1-0.2 % SUSP Apply to eye. One  drop into both eyes three times a day    . Calcium Carbonate-Vitamin D (CALCIUM-VITAMIN D) 500-200 MG-UNIT tablet Take 1 tablet by mouth 2 (two) times daily.    . citalopram (CELEXA) 20 MG tablet Take 20 mg by mouth daily.    . cycloSPORINE (RESTASIS) 0.05 % ophthalmic emulsion Place 1 drop into both eyes 2 (two)  times daily.    Marland Kitchen donepezil (ARICEPT) 10 MG tablet Take 10 mg by mouth at bedtime. For dementia    . furosemide (LASIX) 40 MG tablet Give 40 mg only on day 3 and there after    . hydrALAZINE (APRESOLINE) 25 MG tablet Take 50 mg by mouth 2 (two) times daily.     Marland Kitchen ipratropium-albuterol (DUONEB) 0.5-2.5 (3) MG/3ML SOLN Take 3 mLs by nebulization every 6 (six) hours as needed.    . loratadine (CLARITIN) 10 MG tablet Take 10 mg by mouth daily.    Marland Kitchen LORazepam (ATIVAN) 0.5 MG tablet Take 0.25 mg by mouth 2 (two) times daily.     Marland Kitchen lovastatin (MEVACOR) 40 MG tablet Take 40 mg by mouth at bedtime.    . nitroGLYCERIN (NITROSTAT) 0.4 MG SL tablet Place 0.4 mg under the tongue every 5 (five) minutes as needed for chest pain.     . pantoprazole (PROTONIX) 40 MG tablet Take 40 mg by mouth daily.     Marland Kitchen POTASSIUM PO Take 10 meq by mouth daily    . Travoprost, BAK Free, (TRAVATAN) 0.004 % SOLN ophthalmic solution Place 1 drop into both eyes at bedtime. For glaucoma     No current facility-administered medications on file prior to visit.      Review of Systems  Constitutional: Negative for appetite change, chills, diaphoresis, fatigue and fever.  HENT: .  Is not complaining of sore throat does have somewhat chronic postnasal drip Respiratory: Positive for cough. Negative for apnea, choking, chest tightness and shortness of breath.   Cardiovascular: Positive for leg swelling. Negative for chest pain and palpitations.  Gastrointestinal: Negative for abdominal distention, abdominal pain, blood in stool, constipation, diarrhea, nausea and rectal pain.  Musculoskeletal: Positive for arthralgias.  Skin: Positive for color change. Venous stasis changes lower extremities Neurological: Negative for dizziness, facial asymmetry, light-headedness and headaches.  Psychiatric/Behavioral: Negative for agitation, behavioral problems, confusion, decreased concentration and dysphoric mood.     Immunization History    Administered Date(s) Administered  . Influenza Whole 06/11/2007, 10/16/2008, 05/21/2009, 05/05/2010  . Influenza-Unspecified 06/05/2014, 06/02/2016  . Pneumococcal Polysaccharide-23 06/11/2007  . Pneumococcal-Unspecified 06/06/2016  . Td 06/11/2007  . Zoster 05/21/2007   Pertinent  Health Maintenance Due  Topic Date Due  . PNA vac Low Risk Adult (2 of 2 - PCV13) 06/06/2017  . INFLUENZA VACCINE  Completed  . DEXA SCAN  Completed   No flowsheet data found. Functional Status Survey:    Vitals:   07/20/16 1205  BP: (!) 167/65  Pulse: 74  Resp: 20  Temp: 97.3 F (36.3 C)  TempSrc: Oral  SpO2: 95%  OF NOTE manual blood pressure was 140/60 Weight is 192.2 Physical Exam  Gen-l is a pleasant elderly female in no distress  sitting comfortably in her wheelchair a little bit anxious which is not unusual.  Her skin is warm and dry.Venous stasis changes lower extremities bilaterally appear to be chronic I do not really see signs of cellulitis or increased erythema that is concerning  She does have venous stasis changes left greater than right lower extremities.  Eyes pupils appear equal round reactive light visual acuity appears grossly intact.She appears to have some watery drainage or eyes.    Oropharynx is clear mucous membranes moist.--  Chest --- no labored breathing.-Could not really appreciate any overt congestion she tends to cough with deep inspiration which is not new  Heart is regular rate and rhythm without murmur gallop or rub she has baseline lower extremity venous stasis changes possibly 2-3+ left somewhat greater than right Has tenderness to palpation of both legs all are difficult to tell if this is true tenderness or more anxiety related  Abdomen is obese soft nontender positive bowel sounds.  Muscle skeletal moves all extremities at baseline is ambulating in a wheelchair-do not note any acute deformities she does have arthritic changes continues  have some pain with palpitation of her legs    Neurologic is grossly intact her speech is clear I don't see any focal weaknesses cranial nerves are grossly intact.  Psych she is oriented to self she is pleasant conversant i with some confusion at times --dementia continues to be somewhat mild.         Labs reviewed:  Recent Labs  03/29/16 0700 05/21/16 0615 05/30/16 0600  NA 142 140 139  K 4.1 4.4 4.2  CL 110 108 104  CO2 25 27 28   GLUCOSE 102* 89 96  BUN 51* 41* 44*  CREATININE 1.28* 1.47* 1.41*  CALCIUM 9.5 8.5* 9.3    Recent Labs  10/08/15 0705 02/11/16 0740  AST 16 15  ALT 12* 13*  ALKPHOS 112 98  BILITOT 0.6 0.6  PROT 7.5 7.2  ALBUMIN 3.9 3.8    Recent Labs  03/29/16 0700 05/21/16 0615 05/30/16 0600  WBC 6.0 5.9 6.0  NEUTROABS 3.2 3.1 3.2  HGB 11.7* 10.4* 12.0  HCT 36.2 32.8* 36.9  MCV 85.6 88.4 85.8  PLT 206 208 210   Lab Results  Component Value Date   TSH 1.454 04/10/2015   Lab Results  Component Value Date   HGBA1C 6.0 (H) 03/09/2016   Lab Results  Component Value Date   CHOL 203 (H) 03/09/2016   HDL 101 03/09/2016   LDLCALC 93 03/09/2016   TRIG 45 03/09/2016   CHOLHDL 2.0 03/09/2016    Significant Diagnostic Results in last 30 days:  No results found.  Assessment/Plan  #1 increased edema-this appears to be somewhat questionable weight appears to be stable will order weights for tomorrow to see if there has been eating weight gain-she is complaining of increased leg pain and feels she may have a not developing on the left leg although I could not really appreciate this much on physical exam-she does have a history of. A minimal amount of age-indeterminate nonocclusive DVT in the distal aspect of the left femoral vein-she did complete an extended course of  Eliquis--this was recently switched aspirin but patient has been refusing aspirin saying she cannot take it--will order repeat venous Dopplers here since she feels there  is a clot developing in her left leg-clinically this is not acutely apparent   In regards hypertension-this appears to be moderating with the increased dose of hydralazine at this point will  continue to monitor manual blood pressure was reassuring today   Addendum have received the Doppler report it does not show any evidence of lower extremity DVT which is reassuring-again will monitor her weights and possibly adjust her Lasix accordingly --- encouraged leg elevation as well  [id number].  ..     [id number]      Oralia Manis, [place]

## 2016-07-27 ENCOUNTER — Encounter: Payer: Self-pay | Admitting: Internal Medicine

## 2016-07-27 ENCOUNTER — Non-Acute Institutional Stay (SKILLED_NURSING_FACILITY): Payer: Medicare Other | Admitting: Internal Medicine

## 2016-07-27 DIAGNOSIS — I10 Essential (primary) hypertension: Secondary | ICD-10-CM | POA: Diagnosis not present

## 2016-07-27 DIAGNOSIS — K219 Gastro-esophageal reflux disease without esophagitis: Secondary | ICD-10-CM

## 2016-07-27 DIAGNOSIS — J309 Allergic rhinitis, unspecified: Secondary | ICD-10-CM

## 2016-07-27 DIAGNOSIS — I82402 Acute embolism and thrombosis of unspecified deep veins of left lower extremity: Secondary | ICD-10-CM

## 2016-07-27 DIAGNOSIS — F0391 Unspecified dementia with behavioral disturbance: Secondary | ICD-10-CM | POA: Diagnosis not present

## 2016-07-27 DIAGNOSIS — N289 Disorder of kidney and ureter, unspecified: Secondary | ICD-10-CM | POA: Diagnosis not present

## 2016-07-27 NOTE — Progress Notes (Signed)
Location:   [facility] Room Number: 122/D Place of Service:  SNF (31) Provider:  Janyce Llanos, PA-C  Patient Care Team: Wille Celeste, PA-C as PCP - General (Internal Medicine) [name], MD as Consulting Physician (Geriatric Medicine)  Extended Emergency Contact Information Primary Emergency Contact: [name], [place] [address] [name] of [place] Phone: [phone] Relation: Daughter Secondary Emergency Contact: [name] [place] of [place] Phone: [phone] Relation: Son  Code Status:  DNR Goals of care: Advanced Directive information Advanced Directives 07/27/2016  Does Patient Have a Medical Advance Directive? Yes  Type of Advance Directive Out of facility DNR (pink MOST or yellow form)  Does patient want to make changes to medical advance directive? No - Patient declined  Copy of [employer] in Chart? -  Pre-existing out of facility DNR order (yellow form or pink MOST form) -     Chief Complaint  Patient presents with  . Medical Management of Chronic Issues    Routine Visit   Medical management of chronic medical issues including dementia-chronic kidney disease-hypertension-diastolic CHF-history depression-history of GERD-hyperlipidemia HPI:  Pt is a 82 y.o. female seen today for medical management of chronic diseases. She appears to be relatively stable I did see her recently for complaints of increased leg edema this actually if anything appears improved today-weights have shown great deal variability from 192 up to 198 although edema appears to be stable her baseline weight appears to be in the mid 190s at this point will monitor she thinks the edema is better as well.  She does have a history of diastolic CHF her BNP has consistently been low normal range   She has history stage III chronic kidney disease is complicated somewhat with her need for Lasix but has  been stable recently at 1.41 on lab done in early October will update this.  Her hypertension has been an issue we have gradually increased her hydralazine now 50 mg 3 times a day she is also on Lotensin 40 mg a day Norvasc 5 mg day as well as Lasix 40 mg a day recent listed blood pressures 154/75-157/53-118/64 in this range I took it manually today and got 120/60 at this point will monitor-apparently this does go somewhat lower after she receives her medications.  Back in September 2016 she did have a Doppler study done that was positive for a minimal amount of age-indeterminate potentially acute nonocclusive DVT within the distal aspect of the left femoral vein--she did receive an extended course of Eliquist  this was recently discontinued and recommendation to start aspirin by Dr Ria Comment patient refused aspirin saying she was told not to take it although the reasons for this are unclear-nonetheless this has been discontinued secondary to patient refusal.  She thought she had some increased swelling when I saw her recently we did do a repeat Doppler which actually was reassuring and did not show evidence of a left leg DVT   She previously US complain of GERD-like symptoms difficulty swallowing this actually is been worked up by GI and has not really reveal any acute cause for this.  A CT scan of the abdomen was negative she did have an EGD done back in 2012 which showed age-related esophageal dysmotility   In regards to dementia she is doing well with supportive care she is bright alert somewhat confused at times but does quite well with supportive  care she is on Celexa for coexistent depression which appears to be stable as well as low-dose Ativan for her anxiety issues which occasionally become somewhat of an issue.  Regards hyperlipidemia she is on lovastatin LDL was 93 on lab done in August 2017 of note her HDL is consistently high most recently 101 on the lab done in August.  Currently she  is sitting in her wheelchair comfortably she does not really have any complaints today thinks her edema is getting better does not complain of shortness of breath or coughing which at times is a complaint.  Appears to be having a good day   Past Medical History:  Diagnosis Date  . Anxiety   . Cellulitis 2016  . CHF (congestive heart failure) ([facility])   . Chronic kidney disease   . Dementia   . Dysphagia   . Gait abnormality   . GERD (gastroesophageal reflux disease)   . Glaucoma   . Gout   . Hyperlipidemia   . Hypertension   . Muscle weakness   . Myocardial infarction 1990s   [place]  . Osteoarthritis   . Syncope    Past Surgical History:  Procedure Laterality Date  . APPENDECTOMY    . CATARACT EXTRACTION W/PHACO  08/04/2011   Procedure: CATARACT EXTRACTION PHACO AND INTRAOCULAR LENS PLACEMENT (IOC);  Surgeon: Tonny Branch;  Location: [facility];  Service: Ophthalmology;  Laterality: Right;  CDE=18.53  . CATARACT EXTRACTION W/PHACO  08/25/2011   Procedure: CATARACT EXTRACTION PHACO AND INTRAOCULAR LENS PLACEMENT (IOC);  Surgeon: Tonny Branch;  Location: [facility];  Service: Ophthalmology;  Laterality: Left;  CDE:14.76  . TONSILLECTOMY      Allergies  Allergen Reactions  . Bee Venom Anaphylaxis  . Aspirin     Directed by physician  . Cabbage Other (See Comments)    Certain foods due to gout flares  . Latex Rash    Current Outpatient Prescriptions on File Prior to Visit  Medication Sig Dispense Refill  . acetaminophen (TYLENOL) 500 MG tablet Take 1,000 mg by mouth 2 (two) times daily. For leg pain and arthritis. May have additional 1000 mg for break through pain    . amLODipine (NORVASC) 5 MG tablet Take 5 mg by mouth daily.    . benazepril (LOTENSIN) 40 MG tablet Take 40 mg by mouth daily.    . Brinzolamide-Brimonidine (SIMBRINZA) 1-0.2 % SUSP Apply to eye. One  drop into both eyes three times a day    . Calcium Carbonate-Vitamin D (CALCIUM-VITAMIN D) 500-200 MG-UNIT tablet Take 1  tablet by mouth 2 (two) times daily.    . citalopram (CELEXA) 20 MG tablet Take 20 mg by mouth daily.    . cycloSPORINE (RESTASIS) 0.05 % ophthalmic emulsion Place 1 drop into both eyes 2 (two) times daily.    Marland Kitchen donepezil (ARICEPT) 10 MG tablet Take 10 mg by mouth at bedtime. For dementia    . furosemide (LASIX) 40 MG tablet Give 40 mg only on day 3 and there after    . hydrALAZINE (APRESOLINE) 25 MG tablet Take 50 mg by mouth 3 (three) times daily.     Marland Kitchen ipratropium-albuterol (DUONEB) 0.5-2.5 (3) MG/3ML SOLN Take 3 mLs by nebulization every 6 (six) hours as needed.    . loratadine (CLARITIN) 10 MG tablet Take 10 mg by mouth daily.    Marland Kitchen LORazepam (ATIVAN) 0.5 MG tablet Take 0.25 mg by mouth 2 (two) times daily.     Marland Kitchen lovastatin (MEVACOR) 40 MG tablet Take  40 mg by mouth at bedtime.    . nitroGLYCERIN (NITROSTAT) 0.4 MG SL tablet Place 0.4 mg under the tongue every 5 (five) minutes as needed for chest pain.     . pantoprazole (PROTONIX) 40 MG tablet Take 40 mg by mouth daily.     Marland Kitchen POTASSIUM PO Take 10 meq by mouth daily    . Travoprost, BAK Free, (TRAVATAN) 0.004 % SOLN ophthalmic solution Place 1 drop into both eyes at bedtime. For glaucoma     No current facility-administered medications on file prior to visit.     Review of Systems  General no complaints of fever chills weight appears variable -- I suspect scale variation She has no complaints today  Skin does not complain of rashes or itching.--At times will develop suspected lower extremity cellulitis---thishas been stable for  a while   Head ears eyes nose mouth and throat does not complain of any visual changes or sore throat she does have some history of allergic rhinitis.  Respiratory is not complaining of any increased shortness of breath beyond baseline does not complain of cough.  Cardiac does not complain of chest pain or palpitations chronic lower extremity edema-- if anything appears a bit improved today  GI does not  complain of abdominal pain nausea vomiting diarrhea constipation.  Muscle skeletal does have a history of back pain and osteoarthritis but this appears to be fairly well controlled on Tylenol.-At times will complain of some right back pain is appears to be movement-- muscle related  Neurologic does not complain of dizziness headache or numbness.  Psych history of anxiety and depression and mood disorder this is been relatively stable today she does have increased anxiety. At times but appears calm and comfortable today  Immunization History  Administered Date(s) Administered  . Influenza Whole 06/11/2007, 10/16/2008, 05/21/2009, 05/05/2010  . Influenza-Unspecified 06/05/2014, 06/02/2016  . Pneumococcal Polysaccharide-23 06/11/2007  . Pneumococcal-Unspecified 06/06/2016  . Td 06/11/2007  . Zoster 05/21/2007   Pertinent  Health Maintenance Due  Topic Date Due  . PNA vac Low Risk Adult (2 of 2 - PCV13) 06/06/2017  . INFLUENZA VACCINE  Completed  . DEXA SCAN  Completed   No flowsheet data found. Functional Status Survey:    Vitals:   07/27/16 1006  BP: (!) 169/74  Pulse: 74  Resp: 20  Temp: 99 F (37.2 C)  TempSrc: Oral   manual blood pressure today was 120/60--there continues to be quite a bit of variation Recent weights variable 1 96-1 92-1 98 Physical Exam   Gen-l is a pleasant elderly female in no distress.  Her skin is warm and dry. Venous stasis changes lower extremities bilaterally appear to be chronic I do not really see signs of cellulitis or increased erythema -edema actually appears a bit improved compared to last visit  She does have venous stasis changes left greater than right lower extremities.  Eyes pupils appear equal round reactive light visual acuity appears grossly intact.  Oropharynx is clear mucous membranes moist.--She is edentulous  Chest -- clear to auscultation -- no labored breathing.  Heart is regular rate and rhythm without murmur  gallop or rub she has baseline lower extremity venous stasis changes possibly 2-+ left somewhat greater than right which is not new.  Abdomen is obese soft nontender positive bowel sounds.  Muscle skeletal moves all extremities at baseline is ambulating in a wheelchair-do not note any acute deformities she does have arthritic changes continues have some pain with palpitation of her  legs but this is not new and actually it appears a bit improved today when I palpate her legs    Neurologic is grossly intact her speech is clear I don't see any focal weaknesses cranial nerves are grossly intact.  Psych she is oriented to self she is pleasant conversant i with some confusion at times -- dementia continues to be somewhat mild. Labs reviewed:  Labs reviewed:  Recent Labs  03/29/16 0700 05/21/16 0615 05/30/16 0600  NA 142 140 139  K 4.1 4.4 4.2  CL 110 108 104  CO2 25 27 28   GLUCOSE 102* 89 96  BUN 51* 41* 44*  CREATININE 1.28* 1.47* 1.41*  CALCIUM 9.5 8.5* 9.3    Recent Labs  10/08/15 0705 02/11/16 0740  AST 16 15  ALT 12* 13*  ALKPHOS 112 98  BILITOT 0.6 0.6  PROT 7.5 7.2  ALBUMIN 3.9 3.8    Recent Labs  03/29/16 0700 05/21/16 0615 05/30/16 0600  WBC 6.0 5.9 6.0  NEUTROABS 3.2 3.1 3.2  HGB 11.7* 10.4* 12.0  HCT 36.2 32.8* 36.9  MCV 85.6 88.4 85.8  PLT 206 208 210   Lab Results  Component Value Date   TSH 1.454 04/10/2015   Lab Results  Component Value Date   HGBA1C 6.0 (H) 03/09/2016   Lab Results  Component Value Date   CHOL 203 (H) 03/09/2016   HDL 101 03/09/2016   LDLCALC 93 03/09/2016   TRIG 45 03/09/2016   CHOLHDL 2.0 03/09/2016    Significant Diagnostic Results in last 30 days:  US Venous Img Lower Bilateral  Result Date: 07/20/2016 CLINICAL DATA:  Bilateral leg swelling. Chronic pain. History of DVT. EXAM: BILATERAL LOWER EXTREMITY VENOUS DOPPLER ULTRASOUND TECHNIQUE: Gray-scale sonography with compression, as well as color and duplex  ultrasound, were performed to evaluate the deep venous system from the level of the common femoral vein through the popliteal and proximal calf veins. COMPARISON:  05/22/2015 FINDINGS: Normal compressibility of the common femoral, superficial femoral, and popliteal veins, as well as the proximal calf veins. No filling defects to suggest DVT on grayscale or color Doppler imaging. Doppler waveforms show normal direction of venous flow, normal respiratory phasicity and response to augmentation. Visualized segments of the saphenous venous system normal in caliber and compressibility. Limited evaluation of calf veins secondary to body habitus. IMPRESSION: No evidence of  lower extremity deep vein thrombosis. Electronically Signed   By: Lucrezia Europe M.D.   On: 07/20/2016 15:00    Assessment/Plan  #1 history of edema-essentially appears slightly improved weights appear to be variable he says she feels the edema is better today-she does have a history of diastolic CHF which appears stable she is on Lasix 40 mg a day with potassium supplementation today-at this point will continue to monitor encourage leg elevation when possible.  #2 history of stage III chronic kidney disease this appears relatively baseline with creatinine 1.41 on lab done 05/30/2016 will update this.  #3 hypertension this has been an issue recently she is now on hydralazine 50 mg 3 times a day-Lotensin 40 mg a day as well as Norvasc 5 mg a day-manual blood pressure today was reassuring at 120/60-she does have some elevations however at this point will monitor if consistent elevations consider increasing hydralazine.  #4-history of left leg DVT-the area actually did do a follow-up Doppler recently secondary to patient thing and she had a recurrent DVT in the leg however he really did not show any clot which was reassuring  she had previously been treated with Elquist  this was switched to aspirin-per patient has been refusing aspirin saying she  cannot take it-nonetheless the Doppler study was reassuring.  #5-history of dementia with depression this has been quite stable on supportive care as well as Aricept-- she does receive Celexa and she is also on low-dose Ativan for fairly persistent anxiety at times.  #6 history of GERD she is on proton aches this appears stable today she has had an extensive workup as noted above no overwhelming acute process has been found.  #7 hyperlipidemia this appears well controlled on lovastatin --HDL101 LDL 93 on lab done back in August 7    #8-allergic rhinitis has been stable on Claritin.    [id number] note greater than 40 minutes spent assessing patient-reviewing her chart-her labs-and coordinating and formulating a plan of care for numerous diagnoses-of note greater than 50% of time spent coordinating plan of care

## 2016-07-28 ENCOUNTER — Encounter (HOSPITAL_COMMUNITY)
Admission: RE | Admit: 2016-07-28 | Discharge: 2016-07-28 | Disposition: A | Discharge status: Home / Self Care | Payer: Medicare Other | Source: Skilled Nursing Facility | Attending: Internal Medicine | Admitting: Internal Medicine

## 2016-07-28 DIAGNOSIS — I509 Heart failure, unspecified: Secondary | ICD-10-CM | POA: Diagnosis not present

## 2016-07-28 LAB — BASIC METABOLIC PANEL
ANION GAP: 7 (ref 5–15)
BUN: 43 mg/dL — AB (ref 6–20)
CHLORIDE: 107 mmol/L (ref 101–111)
CO2: 27 mmol/L (ref 22–32)
Calcium: 9.3 mg/dL (ref 8.9–10.3)
Creatinine, Ser: 1.27 mg/dL — ABNORMAL HIGH (ref 0.44–1.00)
GFR calc Af Amer: 41 mL/min — ABNORMAL LOW (ref >60)
GFR calc non Af Amer: 36 mL/min — ABNORMAL LOW (ref >60)
GLUCOSE: 98 mg/dL (ref 65–99)
POTASSIUM: 4.2 mmol/L (ref 3.5–5.1)
Sodium: 141 mmol/L (ref 135–145)

## 2016-08-25 DIAGNOSIS — M79674 Pain in right toe(s): Secondary | ICD-10-CM | POA: Diagnosis not present

## 2016-08-25 DIAGNOSIS — I70203 Unspecified atherosclerosis of native arteries of extremities, bilateral legs: Secondary | ICD-10-CM | POA: Diagnosis not present

## 2016-08-25 DIAGNOSIS — M79675 Pain in left toe(s): Secondary | ICD-10-CM | POA: Diagnosis not present

## 2016-08-25 DIAGNOSIS — B351 Tinea unguium: Secondary | ICD-10-CM | POA: Diagnosis not present

## 2016-09-01 ENCOUNTER — Other Ambulatory Visit: Payer: Self-pay

## 2016-09-01 MED ORDER — LORAZEPAM 0.5 MG PO TABS: 0.2500 mg | ORAL_TABLET | Freq: Two times a day (BID) | ORAL | 5 refills | Status: DC

## 2016-09-01 NOTE — Telephone Encounter (Signed)
RX faxed to [facility] @ [phone]. Phone number [phone]  

## 2016-09-12 DIAGNOSIS — F329 Major depressive disorder, single episode, unspecified: Secondary | ICD-10-CM | POA: Diagnosis not present

## 2016-09-12 DIAGNOSIS — F411 Generalized anxiety disorder: Secondary | ICD-10-CM | POA: Diagnosis not present

## 2016-09-12 DIAGNOSIS — F0390 Unspecified dementia without behavioral disturbance: Secondary | ICD-10-CM | POA: Diagnosis not present

## 2016-09-26 ENCOUNTER — Non-Acute Institutional Stay (SKILLED_NURSING_FACILITY): Payer: Medicare Other | Admitting: Internal Medicine

## 2016-09-26 ENCOUNTER — Encounter: Payer: Self-pay | Admitting: Internal Medicine

## 2016-09-26 DIAGNOSIS — R0989 Other specified symptoms and signs involving the circulatory and respiratory systems: Secondary | ICD-10-CM | POA: Diagnosis not present

## 2016-09-26 DIAGNOSIS — N289 Disorder of kidney and ureter, unspecified: Secondary | ICD-10-CM

## 2016-09-26 DIAGNOSIS — J40 Bronchitis, not specified as acute or chronic: Secondary | ICD-10-CM

## 2016-09-26 DIAGNOSIS — F411 Generalized anxiety disorder: Secondary | ICD-10-CM

## 2016-09-26 DIAGNOSIS — I10 Essential (primary) hypertension: Secondary | ICD-10-CM | POA: Diagnosis not present

## 2016-09-26 DIAGNOSIS — R059 Cough, unspecified: Secondary | ICD-10-CM

## 2016-09-26 DIAGNOSIS — R05 Cough: Secondary | ICD-10-CM | POA: Diagnosis not present

## 2016-09-26 NOTE — Progress Notes (Signed)
Location:   [facility] Room Number: 122/D Place of Service:  SNF (31) Provider:  Janyce Llanos, PA-C  Patient Care Team: Wille Celeste, PA-C as PCP - General (Internal Medicine) [name], MD as Consulting Physician (Geriatric Medicine)  Extended Emergency Contact Information Primary Emergency Contact: [name], [place] [address] [name] of [place] Phone: [phone] Relation: Daughter Secondary Emergency Contact: [name] States of [place] Phone: [phone] Relation: Son  Code Status:  DNR Goals of care: Advanced Directive information Advanced Directives 09/26/2016  Does Patient Have a Medical Advance Directive? Yes  Type of Advance Directive Out of facility DNR (pink MOST or yellow form)  Does patient want to make changes to medical advance directive? No - Patient declined  Copy of [place] in Chart? -  Pre-existing out of facility DNR order (yellow form or pink MOST form) -     Chief Complaint  Patient presents with  . Acute Visit    Cough    HPI:  Pt is a 83 y.o. female seen today for an acute visit for For coughing.  She was seen back in September for similar presentation.  It was thought at that point she had bronchitis.  She was treated aggressively with an antibiotic as well as expectorant nebulizers and Flonase.  Her vital signs are stable she has somewhat of an elevated blood pressure at 151/70 on when she is anxious or has an acute process it tends to rise this will have to be monitored.  Her O2 saturations are satisfactory in the 90s on room air.  She is complaining at times of shortness of breath but this is somewhat of a chronic complaint.  Currently she is resting in her [place] chair appears somewhat anxious but no sign of distress.     Past Medical History:  Diagnosis Date  . Anxiety   . Cellulitis 2016  . CHF (congestive heart  failure) ([place])   . Chronic kidney disease   . Dementia   . Dysphagia   . Gait abnormality   . GERD (gastroesophageal reflux disease)   . Glaucoma   . Gout   . Hyperlipidemia   . Hypertension   . Muscle weakness   . Myocardial infarction 1990s   [place]  . Osteoarthritis   . Syncope    Past Surgical History:  Procedure Laterality Date  . APPENDECTOMY    . CATARACT EXTRACTION W/PHACO  08/04/2011   Procedure: CATARACT EXTRACTION PHACO AND INTRAOCULAR LENS PLACEMENT (IOC);  Surgeon: Tonny Branch;  Location: [facility];  Service: Ophthalmology;  Laterality: Right;  CDE=18.53  . CATARACT EXTRACTION W/PHACO  08/25/2011   Procedure: CATARACT EXTRACTION PHACO AND INTRAOCULAR LENS PLACEMENT (IOC);  Surgeon: Tonny Branch;  Location: [facility];  Service: Ophthalmology;  Laterality: Left;  CDE:14.76  . TONSILLECTOMY      Allergies  Allergen Reactions  . Bee Venom Anaphylaxis  . Aspirin     Directed by physician  . Cabbage Other (See Comments)    Certain foods due to gout flares  . Latex Rash    Current Outpatient Prescriptions on File Prior to Visit  Medication Sig Dispense Refill  . acetaminophen (TYLENOL) 500 MG tablet Take 1,000 mg by mouth 2 (two) times daily. For leg pain and arthritis. May have additional 1000 mg for break through pain    . amLODipine (NORVASC) 5 MG tablet Take 5  mg by mouth daily.    . benazepril (LOTENSIN) 40 MG tablet Take 40 mg by mouth daily.    . Brinzolamide-Brimonidine (SIMBRINZA) 1-0.2 % SUSP Apply to eye. One  drop into both eyes three times a day    . Calcium Carbonate-Vitamin D (CALCIUM-VITAMIN D) 500-200 MG-UNIT tablet Take 1 tablet by mouth 2 (two) times daily.    . citalopram (CELEXA) 20 MG tablet Take 20 mg by mouth daily.    . cycloSPORINE (RESTASIS) 0.05 % ophthalmic emulsion Place 1 drop into both eyes 2 (two) times daily.    Marland Kitchen donepezil (ARICEPT) 10 MG tablet Take 10 mg by mouth at bedtime. For dementia    . furosemide (LASIX) 40 MG tablet Give 40 mg  only on day 3 and there after    . hydrALAZINE (APRESOLINE) 25 MG tablet Take 50 mg by mouth 3 (three) times daily.     Marland Kitchen ipratropium-albuterol (DUONEB) 0.5-2.5 (3) MG/3ML SOLN Take 3 mLs by nebulization every 6 (six) hours as needed.    . loratadine (CLARITIN) 10 MG tablet Take 10 mg by mouth daily.    Marland Kitchen LORazepam (ATIVAN) 0.5 MG tablet Take 0.5 tablets (0.25 mg total) by mouth 2 (two) times daily. 30 tablet 5  . lovastatin (MEVACOR) 40 MG tablet Take 40 mg by mouth at bedtime.    . nitroGLYCERIN (NITROSTAT) 0.4 MG SL tablet Place 0.4 mg under the tongue every 5 (five) minutes as needed for chest pain.     . pantoprazole (PROTONIX) 40 MG tablet Take 40 mg by mouth daily.     Marland Kitchen POTASSIUM PO Take 10 meq by mouth daily    . Travoprost, BAK Free, (TRAVATAN) 0.004 % SOLN ophthalmic solution Place 1 drop into both eyes at bedtime. For glaucoma     No current facility-administered medications on file prior to visit.      Review of Systems   General no complaints of fever chills   Skin does not complain of rashes or itching.--  Head ears eyes nose mouth and throat does not complain of any visual changes or sore throat she does have some history of allergic rhinitis is on Claritin   Respiratory  Complains of a cough this appears to be.active has somewhat chronic complaints of shortness of breath this does not appear to be increased from her baseline  Cardiac does not complain of chest pain or palpitations haschronic lower extremity edema-  GI does not complain of abdominal pain nausea vomiting diarrhea constipation.  Muscle skeletal does have a history of back pain and osteoarthritis but this appears to be fairly well controlled on Tylenol.-  Neurologic does not complain of dizziness or numbness.  Psych history of anxiety and depression and mood disorder this is been relatively stable  she does have increased anxiety.At times   Immunization History  Administered Date(s)  Administered  . Influenza Whole 06/11/2007, 10/16/2008, 05/21/2009, 05/05/2010  . Influenza-Unspecified 06/05/2014, 06/02/2016  . Pneumococcal Polysaccharide-23 06/11/2007  . Pneumococcal-Unspecified 06/06/2016  . Td 06/11/2007  . Zoster 05/21/2007   Pertinent  Health Maintenance Due  Topic Date Due  . PNA vac Low Risk Adult (2 of 2 - PCV13) 06/06/2017  . INFLUENZA VACCINE  Completed  . DEXA SCAN  Completed   No flowsheet data found. Functional Status Survey:    Vitals:   09/26/16 1150  BP: (!) 151/75  Pulse: 65  Resp: 19  Temp: (!) 96.4 F (35.8 C)  TempSrc: Oral  SpO2: 97%  Weight appears  to be relatively stable at 197.4 pounds  Physical Exam Genthis is a pleasant elderly female in no distress. Appears somewhat anxious about her cough  Her skin is warm and dry.Venous stasis changes lower extremities bilaterally appear to be chronic I do not really see signs of cellulitis or increased erythema that is concerning  She does have venous stasis changes left greater than right lower extremities.  Eyes pupils appear equal round reactive light visual acuity appears grossly intact.She appears to have some watery drainage or eyes.  Nose also appears to have some clear drainage   Oropharynx is clear mucous membranes moist.--  Chest --- no labored breathing.-Some diffuse coarse breath sounds on expiration  Heart is regular rate and rhythm without murmur gallop or rub -she has chronic venous stasis changes bilaterally lower extremities  Abdomen is obese soft nontender positive bowel sounds.  Muscle skeletal moves all extremities at baseline is ambulating in a wheelchair-do not note any acute deformities she does have arthritic changes continues have some pain with palpitation of her legs but this is not new.    Neurologic is grossly intact her speech is clear I don't see any focal weaknesses cranial nerves are grossly intact.  Psych she is oriented to self  she is pleasant conversant a bit anxious today   Labs reviewed:  Recent Labs  05/21/16 0615 05/30/16 0600 07/28/16 0500  NA 140 139 141  K 4.4 4.2 4.2  CL 108 104 107  CO2 27 28 27   GLUCOSE 89 96 98  BUN 41* 44* 43*  CREATININE 1.47* 1.41* 1.27*  CALCIUM 8.5* 9.3 9.3    Recent Labs  10/08/15 0705 02/11/16 0740  AST 16 15  ALT 12* 13*  ALKPHOS 112 98  BILITOT 0.6 0.6  PROT 7.5 7.2  ALBUMIN 3.9 3.8    Recent Labs  03/29/16 0700 05/21/16 0615 05/30/16 0600  WBC 6.0 5.9 6.0  NEUTROABS 3.2 3.1 3.2  HGB 11.7* 10.4* 12.0  HCT 36.2 32.8* 36.9  MCV 85.6 88.4 85.8  PLT 206 208 210   Lab Results  Component Value Date   TSH 1.454 04/10/2015   Lab Results  Component Value Date   HGBA1C 6.0 (H) 03/09/2016   Lab Results  Component Value Date   CHOL 203 (H) 03/09/2016   HDL 101 03/09/2016   LDLCALC 93 03/09/2016   TRIG 45 03/09/2016   CHOLHDL 2.0 03/09/2016    Significant Diagnostic Results in last 30 days:  No results found.  Assessment/Plan . #1-history of cough or chest congestion-she has had a similar presentation back in September and responded well to fairly aggressive treatment-will order a chest x-ray 2 view-also will start Mucinex 6 mg twice a day for 7 days-also will make her duo nebs routine every 6 hours for 72 hours and then when necessary.  Will add Flonase 1 spray twice a day to each nostril for 5 days as well secondary to possible allergic rhinitis component to this as well she does have some nasal night drainage.  Last time antibiotic appear to help with cough with I suspect may be bronchitis will start her again on Avelox 400 mg daily for 7 days and had a probiotic twice a day for 10 days  Monitor with vital signs pulse ox every shift for 72 hours.  #2-hypertension-she is currently on hydralazine 50 mg 3 times a day-Lotensin 40 mg daily as well as Norvasc 5 mg a day-blood pressure is somewhat elevated today systolic in the [id number]  has  some variability in the past-manual blood pressure today was comparable at times this will be lower than the machine reading-however she had been coughing which I suspect lead to some elevation at this point will monitor if blood pressure consistently elevated consider increasing her Norvasc or hydralazine.  #3 history of stage III chronic kidney disease this appears relatively stable with a creatinine 1.27 BUN of 43 on lab done in late November 2017 will update this.  #4 history of left leg DVT-or edema appears to be at baseline today and one point had been on Eliquis -Most recent Dopplers that he was reassuring and did not really show any clot--she was started on aspirin but she is refusing the aspirin saying she cannot take it and has a reaction to it although this is somewhat unclear nonetheless she does refuse it and thus it has been discontinued.  Clinically this appears stable however.  #5 history of diastolic CHF this appears stable on Lasix with potassium supplementation-her weight appears to be stable edema complicated with venous stasis changes appears to be relatively baseline  #6-anxiety-I suspect this may be causing somewhat elevated blood pressures as well-she is on Ativan twice a day at this point will monitor this does not appear to be acutely increased from baseline I suspect the coughing however has somewhat aggravated her anxiety  Of note Will N addition to the basic metabolic panel obtain a CBC with differential as well to assess any possible elevated white count.  [id number] note greater than 35 minutes spent assessing patient-reviewing her chart-reviewing her labs-discussing her status with nursing staff-in coordinating and formulating a plan of care for numerous diagnoses-of note greater than 50% of time spent coordinating plan of care       Oralia Manis, [place]

## 2016-09-27 ENCOUNTER — Encounter (HOSPITAL_COMMUNITY)
Admission: RE | Admit: 2016-09-27 | Discharge: 2016-09-27 | Disposition: A | Discharge status: Home / Self Care | Payer: Medicare Other | Source: Skilled Nursing Facility | Attending: Internal Medicine | Admitting: Internal Medicine

## 2016-09-27 DIAGNOSIS — I509 Heart failure, unspecified: Secondary | ICD-10-CM | POA: Diagnosis not present

## 2016-09-27 DIAGNOSIS — K210 Gastro-esophageal reflux disease with esophagitis: Secondary | ICD-10-CM | POA: Insufficient documentation

## 2016-09-27 DIAGNOSIS — F419 Anxiety disorder, unspecified: Secondary | ICD-10-CM | POA: Diagnosis not present

## 2016-09-27 DIAGNOSIS — R609 Edema, unspecified: Secondary | ICD-10-CM | POA: Diagnosis not present

## 2016-09-27 DIAGNOSIS — F339 Major depressive disorder, recurrent, unspecified: Secondary | ICD-10-CM | POA: Insufficient documentation

## 2016-09-27 LAB — CBC WITH DIFFERENTIAL/PLATELET
BASOS PCT: 0 %
Basophils Absolute: 0 10*3/uL (ref 0.0–0.1)
Eosinophils Absolute: 0.1 10*3/uL (ref 0.0–0.7)
Eosinophils Relative: 1 %
HCT: 34.9 % — ABNORMAL LOW (ref 36.0–46.0)
HEMOGLOBIN: 11.3 g/dL — AB (ref 12.0–15.0)
Lymphocytes Relative: 22 %
Lymphs Abs: 0.9 10*3/uL (ref 0.7–4.0)
MCH: 28 pg (ref 26.0–34.0)
MCHC: 32.4 g/dL (ref 30.0–36.0)
MCV: 86.6 fL (ref 78.0–100.0)
MONOS PCT: 16 %
Monocytes Absolute: 0.7 10*3/uL (ref 0.1–1.0)
NEUTROS PCT: 61 %
Neutro Abs: 2.6 10*3/uL (ref 1.7–7.7)
Platelets: 193 10*3/uL (ref 150–400)
RBC: 4.03 MIL/uL (ref 3.87–5.11)
RDW: 15.6 % — AB (ref 11.5–15.5)
WBC: 4.2 10*3/uL (ref 4.0–10.5)

## 2016-09-27 LAB — COMPREHENSIVE METABOLIC PANEL
ALBUMIN: 3.9 g/dL (ref 3.5–5.0)
ALK PHOS: 57 U/L (ref 38–126)
ALT: 15 U/L (ref 14–54)
ANION GAP: 10 (ref 5–15)
AST: 20 U/L (ref 15–41)
BUN: 29 mg/dL — ABNORMAL HIGH (ref 6–20)
CHLORIDE: 102 mmol/L (ref 101–111)
CO2: 26 mmol/L (ref 22–32)
Calcium: 9.2 mg/dL (ref 8.9–10.3)
Creatinine, Ser: 1.39 mg/dL — ABNORMAL HIGH (ref 0.44–1.00)
GFR calc Af Amer: 37 mL/min — ABNORMAL LOW (ref >60)
GFR calc non Af Amer: 32 mL/min — ABNORMAL LOW (ref >60)
GLUCOSE: 116 mg/dL — AB (ref 65–99)
POTASSIUM: 3.8 mmol/L (ref 3.5–5.1)
Sodium: 138 mmol/L (ref 135–145)
Total Bilirubin: 0.5 mg/dL (ref 0.3–1.2)
Total Protein: 7.1 g/dL (ref 6.5–8.1)

## 2016-10-03 ENCOUNTER — Other Ambulatory Visit (HOSPITAL_COMMUNITY)
Admission: RE | Admit: 2016-10-03 | Discharge: 2016-10-03 | Disposition: A | Discharge status: Home / Self Care | Payer: Medicare Other | Source: Skilled Nursing Facility | Attending: Pediatrics | Admitting: Pediatrics

## 2016-10-03 ENCOUNTER — Encounter: Payer: Self-pay | Admitting: Internal Medicine

## 2016-10-03 ENCOUNTER — Non-Acute Institutional Stay (SKILLED_NURSING_FACILITY): Payer: Medicare Other | Admitting: Internal Medicine

## 2016-10-03 DIAGNOSIS — R509 Fever, unspecified: Secondary | ICD-10-CM

## 2016-10-03 DIAGNOSIS — I509 Heart failure, unspecified: Secondary | ICD-10-CM | POA: Insufficient documentation

## 2016-10-03 DIAGNOSIS — R05 Cough: Secondary | ICD-10-CM | POA: Diagnosis not present

## 2016-10-03 DIAGNOSIS — R059 Cough, unspecified: Secondary | ICD-10-CM

## 2016-10-03 DIAGNOSIS — R609 Edema, unspecified: Secondary | ICD-10-CM | POA: Diagnosis not present

## 2016-10-03 DIAGNOSIS — F339 Major depressive disorder, recurrent, unspecified: Secondary | ICD-10-CM | POA: Diagnosis not present

## 2016-10-03 DIAGNOSIS — F419 Anxiety disorder, unspecified: Secondary | ICD-10-CM | POA: Insufficient documentation

## 2016-10-03 DIAGNOSIS — K210 Gastro-esophageal reflux disease with esophagitis: Secondary | ICD-10-CM | POA: Diagnosis not present

## 2016-10-03 LAB — INFLUENZA PANEL BY PCR (TYPE A & B)
INFLAPCR: NEGATIVE
Influenza B By PCR: NEGATIVE

## 2016-10-03 NOTE — Progress Notes (Deleted)
[facility] Room Number: 122/D Place of Service:  SNF (31) Provider:  Janyce Llanos, PA-C  Patient Care Team: Wille Celeste, PA-C as PCP - General (Internal Medicine) [name], MD as Consulting Physician (Geriatric Medicine)  Extended Emergency Contact Information Primary Emergency Contact: [name], [place] [address] [name] of [place] Phone: [phone] Relation: Daughter Secondary Emergency Contact: [name] States of [place] Phone: [phone] Relation: Son  Code Status:  *** Goals of care: Advanced Directive information Advanced Directives 10/03/2016  Does Patient Have a Medical Advance Directive? Yes  Type of Advance Directive Out of facility DNR (pink MOST or yellow form)  Does patient want to make changes to medical advance directive? No - Patient declined  Copy of [place] in Chart? -  Pre-existing out of facility DNR order (yellow form or pink MOST form) -     Chief Complaint  Patient presents with  . Acute Visit    Elevatedd BP and Possible Flu    HPI:  Pt is a 83 y.o. female seen today for an acute visit for    Past Medical History:  Diagnosis Date  . Anxiety   . Cellulitis 2016  . CHF (congestive heart failure) ([place])   . Chronic kidney disease   . Dementia   . Dysphagia   . Gait abnormality   . GERD (gastroesophageal reflux disease)   . Glaucoma   . Gout   . Hyperlipidemia   . Hypertension   . Muscle weakness   . Myocardial infarction 1990s   [place]  . Osteoarthritis   . Syncope    Past Surgical History:  Procedure Laterality Date  . APPENDECTOMY    . CATARACT EXTRACTION W/PHACO  08/04/2011   Procedure: CATARACT EXTRACTION PHACO AND INTRAOCULAR LENS PLACEMENT (IOC);  Surgeon: Tonny Branch;  Location: [facility];  Service: Ophthalmology;  Laterality: Right;  CDE=18.53  . CATARACT EXTRACTION W/PHACO  08/25/2011   Procedure: CATARACT EXTRACTION  PHACO AND INTRAOCULAR LENS PLACEMENT (IOC);  Surgeon: Tonny Branch;  Location: [facility];  Service: Ophthalmology;  Laterality: Left;  CDE:14.76  . TONSILLECTOMY      Allergies  Allergen Reactions  . Bee Venom Anaphylaxis  . Aspirin     Directed by physician  . Cabbage Other (See Comments)    Certain foods due to gout flares  . Latex Rash    Allergies as of 10/03/2016      Reactions   Bee Venom Anaphylaxis   Aspirin    Directed by physician   Cabbage Other (See Comments)   Certain foods due to gout flares   Latex Rash      Medication List    Notice   This visit is during an admission. Changes to the med list made in this visit will be reflected in the After Visit Summary of the admission.     Review of Systems  Immunization History  Administered Date(s) Administered  . Influenza Whole 06/11/2007, 10/16/2008, 05/21/2009, 05/05/2010  . Influenza-Unspecified 06/05/2014, 06/02/2016  . Pneumococcal Polysaccharide-23 06/11/2007  . Pneumococcal-Unspecified 06/06/2016  . Td 06/11/2007  . Zoster 05/21/2007   Pertinent  Health Maintenance Due  Topic Date Due  . PNA vac Low Risk Adult (2 of 2 - PCV13) 06/06/2017  . INFLUENZA VACCINE  Completed  . DEXA SCAN  Completed   No flowsheet data found. Functional Status Survey:  Vitals:   10/03/16 1128  BP: (!) 169/59  Pulse: 74  Resp: 20  Temp: 98.3 F (36.8 C)  TempSrc: Oral   There is no height or weight on file to calculate BMI. Physical Exam  Labs reviewed:  Recent Labs  05/30/16 0600 07/28/16 0500 09/27/16 0715  NA 139 141 138  K 4.2 4.2 3.8  CL 104 107 102  CO2 28 27 26   GLUCOSE 96 98 116*  BUN 44* 43* 29*  CREATININE 1.41* 1.27* 1.39*  CALCIUM 9.3 9.3 9.2    Recent Labs  10/08/15 0705 02/11/16 0740 09/27/16 0715  AST 16 15 20   ALT 12* 13* 15  ALKPHOS 112 98 57  BILITOT 0.6 0.6 0.5  PROT 7.5 7.2 7.1  ALBUMIN 3.9 3.8 3.9    Recent Labs  05/21/16 0615 05/30/16 0600 09/27/16 0715  WBC 5.9  6.0 4.2  NEUTROABS 3.1 3.2 2.6  HGB 10.4* 12.0 11.3*  HCT 32.8* 36.9 34.9*  MCV 88.4 85.8 86.6  PLT 208 210 193   Lab Results  Component Value Date   TSH 1.454 04/10/2015   Lab Results  Component Value Date   HGBA1C 6.0 (H) 03/09/2016   Lab Results  Component Value Date   CHOL 203 (H) 03/09/2016   HDL 101 03/09/2016   LDLCALC 93 03/09/2016   TRIG 45 03/09/2016   CHOLHDL 2.0 03/09/2016    Significant Diagnostic Results in last 30 days:  No results found.  Assessment/Plan There are no diagnoses linked to this encounter.      Oralia Manis, [place] [phone]   This encounter was created in error - please disregard.

## 2016-10-03 NOTE — Progress Notes (Signed)
This encounter was created in error - please disregard.

## 2016-10-03 NOTE — Progress Notes (Signed)
Location:   [facility] Room Number: 122/D Place of Service:  SNF (31) Provider:  Janyce Llanos, PA-C  Patient Care Team: Wille Celeste, PA-C as PCP - General (Internal Medicine) [name], MD as Consulting Physician (Geriatric Medicine)  Extended Emergency Contact Information Primary Emergency Contact: [name], [place] [address] [name] of [place] Phone: [phone] Relation: Daughter Secondary Emergency Contact: [name] States of [place] Phone: [phone] Relation: Son  Code Status: DNR                        Goals of care: Advanced Directive information Advanced Directives 10/03/2016  Does Patient Have a Medical Advance Directive? Yes  Type of Advance Directive Out of facility DNR (pink MOST or yellow form)  Does patient want to make changes to medical advance directive? No - Patient declined  Copy of [place] in Chart? -  Pre-existing out of facility DNR order (yellow form or pink MOST form) -     Chief complaint-acute visit secondary to hypertension-low-grade fever  HPI:  Pt is a 83 y.o. female seen today for an acute visit for hypertension follow-up-also she's had a low-grade temp in the 99 area at times over the past several days  She recently completed antibiotic for suspected bronchitis chest x-ray did not really show any acute process.  She says her breathing and coughing is better-she does at times have a low-grade temperature however still has a cough at times.  She is receiving Mucinex she is also got nebulizers on an as-needed basis.  Also noted that her blood pressure seems to be elevated fairly consistently-I see ones listed from the bronze 62 [id number] area systolically recently-the lowest one I see actually is a systolic in the 0000000 I took it manually today and got 150/52. She is on numerous medications including drowsiness 50 mg 3 times a day-Lotensin 40  mg a day-as well as Norvasc 5 mg a day.  .     Past Medical History:  Diagnosis Date  . Anxiety   . Cellulitis 2016  . CHF (congestive heart failure) ([place])   . Chronic kidney disease   . Dementia   . Dysphagia   . Gait abnormality   . GERD (gastroesophageal reflux disease)   . Glaucoma   . Gout   . Hyperlipidemia   . Hypertension   . Muscle weakness   . Myocardial infarction 1990s   [place]  . Osteoarthritis   . Syncope    Past Surgical History:  Procedure Laterality Date  . APPENDECTOMY    . CATARACT EXTRACTION W/PHACO  08/04/2011   Procedure: CATARACT EXTRACTION PHACO AND INTRAOCULAR LENS PLACEMENT (IOC);  Surgeon: Tonny Branch;  Location: [facility];  Service: Ophthalmology;  Laterality: Right;  CDE=18.53  . CATARACT EXTRACTION W/PHACO  08/25/2011   Procedure: CATARACT EXTRACTION PHACO AND INTRAOCULAR LENS PLACEMENT (IOC);  Surgeon: Tonny Branch;  Location: [facility];  Service: Ophthalmology;  Laterality: Left;  CDE:14.76  . TONSILLECTOMY      Allergies  Allergen Reactions  . Bee Venom Anaphylaxis  . Aspirin     Directed by physician  . Cabbage Other (See Comments)    Certain foods due to gout flares  . Latex Rash    Current Outpatient Prescriptions on File Prior to Visit  Medication Sig Dispense Refill  . acetaminophen (TYLENOL) 500 MG tablet  Take 1,000 mg by mouth 2 (two) times daily. For leg pain and arthritis. May have additional 1000 mg for break through pain    . amLODipine (NORVASC) 5 MG tablet Take 5 mg by mouth daily.    . benazepril (LOTENSIN) 40 MG tablet Take 40 mg by mouth daily.    . Brinzolamide-Brimonidine (SIMBRINZA) 1-0.2 % SUSP Apply to eye. One  drop into both eyes three times a day    . Calcium Carbonate-Vitamin D (CALCIUM-VITAMIN D) 500-200 MG-UNIT tablet Take 1 tablet by mouth 2 (two) times daily.    . citalopram (CELEXA) 20 MG tablet Take 20 mg by mouth daily.    . cycloSPORINE (RESTASIS) 0.05 % ophthalmic emulsion Place 1 drop into both eyes 2  (two) times daily.    Marland Kitchen donepezil (ARICEPT) 10 MG tablet Take 10 mg by mouth at bedtime. For dementia    . furosemide (LASIX) 40 MG tablet Give 40 mg only on day 3 and there after    . guaiFENesin (MUCINEX) 600 MG 12 hr tablet Take 600 mg by mouth 2 (two) times daily.    . hydrALAZINE (APRESOLINE) 25 MG tablet Take 50 mg by mouth 3 (three) times daily.     Marland Kitchen ipratropium-albuterol (DUONEB) 0.5-2.5 (3) MG/3ML SOLN Take 3 mLs by nebulization every 6 (six) hours as needed.    . loratadine (CLARITIN) 10 MG tablet Take 10 mg by mouth daily.    Marland Kitchen LORazepam (ATIVAN) 0.5 MG tablet Take 0.5 tablets (0.25 mg total) by mouth 2 (two) times daily. 30 tablet 5  . lovastatin (MEVACOR) 40 MG tablet Take 40 mg by mouth at bedtime.    . nitroGLYCERIN (NITROSTAT) 0.4 MG SL tablet Place 0.4 mg under the tongue every 5 (five) minutes as needed for chest pain.     . pantoprazole (PROTONIX) 40 MG tablet Take 40 mg by mouth daily.     Marland Kitchen POTASSIUM PO Take 10 meq by mouth daily    . Probiotic Product (RISA-BID PROBIOTIC) TABS Give 1 tablet by mouth twice a day    . Travoprost, BAK Free, (TRAVATAN) 0.004 % SOLN ophthalmic solution Place 1 drop into both eyes at bedtime. For glaucoma     No current facility-administered medications on file prior to visit.      Review of Systems   General no complaints of fever chills  Skin does not complain of rashes or itching.--  Head ears eyes nose mouth and throat does not complain of any visual changes or sore throat she does have some history of allergic rhinitis is on Claritin   Respiratory   Feels her coughing and shortness of breath has improved  Cardiac does not complain of chest pain or palpitations haschronic lower extremity edema-  GI does not complain of abdominal pain nausea vomiting diarrhea constipation.  Muscle skeletal does have a history of back pain and osteoarthritis but this appears to be fairly well controlled on Tylenol.-  Neurologic does  not complain of dizziness or numbness.  Psych history of anxiety and depression and mood disorder this is been relatively stable  she does have increased anxiety.At times Immunization History  Administered Date(s) Administered  . Influenza Whole 06/11/2007, 10/16/2008, 05/21/2009, 05/05/2010  . Influenza-Unspecified 06/05/2014, 06/02/2016  . Pneumococcal Polysaccharide-23 06/11/2007  . Pneumococcal-Unspecified 06/06/2016  . Td 06/11/2007  . Zoster 05/21/2007   Pertinent  Health Maintenance Due  Topic Date Due  . PNA vac Low Risk Adult (2 of 2 - PCV13) 06/06/2017  . INFLUENZA  VACCINE  Completed  . DEXA SCAN  Completed   No flowsheet data found. Functional Status Survey:    Vitals:   10/03/16 1147  BP: (!) 169/59  Pulse: 74  Resp: 20  Temp: 98.3 F (36.8 C)  TempSrc: Oral  Manual blood pressure reading was 150/52 Weight is 191 pounds Physical Exam  this is a pleasant elderly female in no distress.  feels her cough has improved  Her skin is warm and dry.Venous stasis changes lower extremities bilaterally appear to be chronic I do not really see signs of cellulitis or increased erythema that is concerning  She does have venous stasis changes left greater than right lower extremities.  Eyes pupils appear equal round reactive light visual acuity appears grossly intact..    Oropharynx is clear mucous membranes moist.--  Chest --- no labored breathing.-Some diffuse coarse breath sounds on expiration   Heart is regular rate and rhythm without murmur gallop or rub -she has chronic venous stasis changes bilaterally lower extremities  Abdomen is obese soft nontender positive bowel sounds.  Muscle skeletal moves all extremities at baseline is ambulating in a wheelchair-do not note any acute deformities she does have arthritic changes continues have some pain with palpitation of her legs but this is not new.    Neurologic is grossly intact her speech is  clear I don't see any focal weaknesses cranial nerves are grossly intact.  Psych she is oriented to self she is pleasant conversant less anxious today  Labs reviewed:  Recent Labs  05/30/16 0600 07/28/16 0500 09/27/16 0715  NA 139 141 138  K 4.2 4.2 3.8  CL 104 107 102  CO2 28 27 26   GLUCOSE 96 98 116*  BUN 44* 43* 29*  CREATININE 1.41* 1.27* 1.39*  CALCIUM 9.3 9.3 9.2    Recent Labs  10/08/15 0705 02/11/16 0740 09/27/16 0715  AST 16 15 20   ALT 12* 13* 15  ALKPHOS 112 98 57  BILITOT 0.6 0.6 0.5  PROT 7.5 7.2 7.1  ALBUMIN 3.9 3.8 3.9    Recent Labs  05/21/16 0615 05/30/16 0600 09/27/16 0715  WBC 5.9 6.0 4.2  NEUTROABS 3.1 3.2 2.6  HGB 10.4* 12.0 11.3*  HCT 32.8* 36.9 34.9*  MCV 88.4 85.8 86.6  PLT 208 210 193   Lab Results  Component Value Date   TSH 1.454 04/10/2015   Lab Results  Component Value Date   HGBA1C 6.0 (H) 03/09/2016   Lab Results  Component Value Date   CHOL 203 (H) 03/09/2016   HDL 101 03/09/2016   LDLCALC 93 03/09/2016   TRIG 45 03/09/2016   CHOLHDL 2.0 03/09/2016    Significant Diagnostic Results in last 30 days:  No results found.  Assessment/Plan 1 hypertension-she does appear to have somewhat consistent elevated systolics she is on Lotensin 40 mg a day-Norvasc 5 mg daily as well as hydralazine 50 mg 3 times a day.--We'll increase hydralazine up to 60 mg 3 times a day and monitor she may need further titration  #2 low-grade temperature--Intermittent-chest x-ray most recently was negative for any acute process again low-grade temperature is intermitted-there is flu in facility however we'll order a flu swab and continue to monitor respiratory wise she says she is breathing better the cough is improved-continue to encourage the when necessary nebulizers also continues on Mucinex Recent white count was reassuring within normal limits-she also is not complaining of any dysuria but this will have to be  monitored    [id number]  Oralia Manis, [place]

## 2016-10-04 ENCOUNTER — Encounter: Payer: Self-pay | Admitting: Internal Medicine

## 2016-10-18 ENCOUNTER — Encounter: Payer: Self-pay | Admitting: Internal Medicine

## 2016-10-18 ENCOUNTER — Non-Acute Institutional Stay (SKILLED_NURSING_FACILITY): Payer: Medicare Other | Admitting: Internal Medicine

## 2016-10-18 DIAGNOSIS — I10 Essential (primary) hypertension: Secondary | ICD-10-CM | POA: Diagnosis not present

## 2016-10-18 DIAGNOSIS — N183 Chronic kidney disease, stage 3 unspecified: Secondary | ICD-10-CM

## 2016-10-18 DIAGNOSIS — F0390 Unspecified dementia without behavioral disturbance: Secondary | ICD-10-CM | POA: Diagnosis not present

## 2016-10-18 DIAGNOSIS — F411 Generalized anxiety disorder: Secondary | ICD-10-CM | POA: Diagnosis not present

## 2016-10-18 DIAGNOSIS — F329 Major depressive disorder, single episode, unspecified: Secondary | ICD-10-CM | POA: Diagnosis not present

## 2016-10-18 DIAGNOSIS — F0391 Unspecified dementia with behavioral disturbance: Secondary | ICD-10-CM | POA: Diagnosis not present

## 2016-10-18 NOTE — Progress Notes (Signed)
Location:   [facility] Room Number: 122/D Place of Service:  SNF (31) Provider:  Berna Spare, PA-C  Patient Care Team: Wille Celeste, PA-C as PCP - General (Internal Medicine) [name], MD as Consulting Physician (Geriatric Medicine)  Extended Emergency Contact Information Primary Emergency Contact: [name], [place] [address] [name] of [place] Phone: [phone] Relation: Daughter Secondary Emergency Contact: [name] States of [place] Phone: [phone] Relation: Son  Code Status:  DNR Goals of care: Advanced Directive information Advanced Directives 10/18/2016  Does Patient Have a Medical Advance Directive? Yes  Type of Advance Directive Out of facility DNR (pink MOST or yellow form)  Does patient want to make changes to medical advance directive? No - Patient declined  Copy of [place] in Chart? -  Pre-existing out of facility DNR order (yellow form or pink MOST form) -     Chief Complaint  Patient presents with  . Medical Management of Chronic Issues    Routine Visit    HPI:  Pt is a 83 y.o. female seen today for medical management of chronic diseases.   Patient has H/o Hypertension, Distal DVT, Chronic venous stasis, Diastolic CHF chronic Renal disaese and GERD Glaucoma Dementia and Anxiety  Patient has number of complains including problems with eyes. Not liking her room also cough due to reflux. But she is eating well. Had been Afebrile and doing well in facility. From my last visit she did have Dopplers of her LE as she was c/o of pain and swelling. Dopplers were negative for any acute process. She was also treated for URT infection. Her influenza was negative. She also has difficult to control BP recently her hydralazine was increased to 60 mg. I still see some elevated systolic BP Her weight has stable around 190 to 191 lbs. She says she is trying to  loose weight as she wants get in better shape.  Past Medical History:  Diagnosis Date  . Anxiety   . Cellulitis 2016  . CHF (congestive heart failure) ([place])   . Chronic kidney disease   . Dementia   . Dysphagia   . Gait abnormality   . GERD (gastroesophageal reflux disease)   . Glaucoma   . Gout   . Hyperlipidemia   . Hypertension   . Muscle weakness   . Myocardial infarction 1990s   [place]  . Osteoarthritis   . Syncope    Past Surgical History:  Procedure Laterality Date  . APPENDECTOMY    . CATARACT EXTRACTION W/PHACO  08/04/2011   Procedure: CATARACT EXTRACTION PHACO AND INTRAOCULAR LENS PLACEMENT (IOC);  Surgeon: Tonny Branch;  Location: [facility];  Service: Ophthalmology;  Laterality: Right;  CDE=18.53  . CATARACT EXTRACTION W/PHACO  08/25/2011   Procedure: CATARACT EXTRACTION PHACO AND INTRAOCULAR LENS PLACEMENT (IOC);  Surgeon: Tonny Branch;  Location: [facility];  Service: Ophthalmology;  Laterality: Left;  CDE:14.76  . TONSILLECTOMY      Allergies  Allergen Reactions  . Bee Venom Anaphylaxis  . Aspirin     Directed by physician  . Cabbage Other (See Comments)    Certain foods due to gout flares  . Latex Rash   Current Outpatient Prescriptions on File Prior to Visit  Medication Sig Dispense Refill  . acetaminophen (TYLENOL) 500 MG tablet Take 1,000 mg by mouth 2 (two) times daily. For leg pain and arthritis. May have additional  1000 mg for break through pain    . amLODipine (NORVASC) 5 MG tablet Take 5 mg by mouth daily.    . benazepril (LOTENSIN) 40 MG tablet Take 40 mg by mouth daily.    . Brinzolamide-Brimonidine (SIMBRINZA) 1-0.2 % SUSP Apply to eye. One  drop into both eyes three times a day    . Calcium Carbonate-Vitamin D (CALCIUM-VITAMIN D) 500-200 MG-UNIT tablet Take 1 tablet by mouth 2 (two) times daily.    . citalopram (CELEXA) 20 MG tablet Take 20 mg by mouth daily.    . cycloSPORINE (RESTASIS) 0.05 % ophthalmic emulsion Place 1 drop into both eyes 2 (two)  times daily.    Marland Kitchen donepezil (ARICEPT) 10 MG tablet Take 10 mg by mouth at bedtime. For dementia    . furosemide (LASIX) 40 MG tablet Give 40 mg only on day 3 and there after    . hydrALAZINE (APRESOLINE) 25 MG tablet Take 50 mg by mouth 3 (three) times daily. Along with 10 mg to = 60 mg    . ipratropium-albuterol (DUONEB) 0.5-2.5 (3) MG/3ML SOLN Take 3 mLs by nebulization every 6 (six) hours as needed.    . loratadine (CLARITIN) 10 MG tablet Take 10 mg by mouth daily.    Marland Kitchen LORazepam (ATIVAN) 0.5 MG tablet Take 0.5 tablets (0.25 mg total) by mouth 2 (two) times daily. 30 tablet 5  . lovastatin (MEVACOR) 40 MG tablet Take 40 mg by mouth at bedtime.    . nitroGLYCERIN (NITROSTAT) 0.4 MG SL tablet Place 0.4 mg under the tongue every 5 (five) minutes as needed for chest pain.     . pantoprazole (PROTONIX) 40 MG tablet Take 40 mg by mouth daily.     Marland Kitchen POTASSIUM PO Take 10 meq by mouth daily    . Travoprost, BAK Free, (TRAVATAN) 0.004 % SOLN ophthalmic solution Place 1 drop into both eyes at bedtime. For glaucoma     No current facility-administered medications on file prior to visit.     Review of Systems  Constitutional: Positive for fatigue. Negative for appetite change, chills and fever.  HENT: Positive for rhinorrhea. Negative for sinus pain and sinus pressure.   Respiratory: Positive for cough. Negative for chest tightness, shortness of breath and wheezing.   Cardiovascular: Positive for leg swelling. Negative for chest pain and palpitations.  Gastrointestinal: Positive for abdominal distention. Negative for abdominal pain, constipation and diarrhea.  Genitourinary: Negative for dysuria and urgency.  Musculoskeletal: Positive for back pain and neck pain. Negative for arthralgias.  Skin: Positive for color change. Negative for pallor and rash.  Neurological: Positive for weakness. Negative for dizziness, seizures, speech difficulty and light-headedness.  Psychiatric/Behavioral: Positive for  sleep disturbance. Negative for agitation, decreased concentration and dysphoric mood. The patient is nervous/anxious.     Immunization History  Administered Date(s) Administered  . Influenza Whole 06/11/2007, 10/16/2008, 05/21/2009, 05/05/2010  . Influenza-Unspecified 06/05/2014, 06/02/2016  . Pneumococcal Polysaccharide-23 06/11/2007  . Pneumococcal-Unspecified 06/06/2016  . Td 06/11/2007  . Zoster 05/21/2007   Pertinent  Health Maintenance Due  Topic Date Due  . PNA vac Low Risk Adult (2 of 2 - PCV13) 06/06/2017  . INFLUENZA VACCINE  Completed  . DEXA SCAN  Completed   No flowsheet data found. Functional Status Survey:    Vitals:   10/18/16 1152  BP: 140/65  Pulse: 70  Resp: 20  Temp: 97.8 F (36.6 C)  TempSrc: Oral  SpO2: 95%   There is no height or weight on file  to calculate BMI. Physical Exam  Constitutional: She appears well-developed and well-nourished.  HENT:  Head: Normocephalic.  Mouth/Throat: Oropharynx is clear and moist.  Eyes: Pupils are equal, round, and reactive to light.  Neck: Neck supple.  Cardiovascular: Normal rate.   Pulmonary/Chest: Effort normal and breath sounds normal. No respiratory distress. She has no wheezes. She has no rales.  Abdominal: Soft. Bowel sounds are normal. She exhibits no distension. There is no tenderness. There is no rebound.  Musculoskeletal:  Continues to have swelling of both legs with Chronic Venous changes.  Lymphadenopathy:    She has no cervical adenopathy.  Skin:  Chronic changes in Lower extremities.  Psychiatric: She has a normal mood and affect. Her behavior is normal.    Labs reviewed:  Recent Labs  05/30/16 0600 07/28/16 0500 09/27/16 0715  NA 139 141 138  K 4.2 4.2 3.8  CL 104 107 102  CO2 28 27 26   GLUCOSE 96 98 116*  BUN 44* 43* 29*  CREATININE 1.41* 1.27* 1.39*  CALCIUM 9.3 9.3 9.2    Recent Labs  02/11/16 0740 09/27/16 0715  AST 15 20  ALT 13* 15  ALKPHOS 98 57  BILITOT 0.6 0.5   PROT 7.2 7.1  ALBUMIN 3.8 3.9    Recent Labs  05/21/16 0615 05/30/16 0600 09/27/16 0715  WBC 5.9 6.0 4.2  NEUTROABS 3.1 3.2 2.6  HGB 10.4* 12.0 11.3*  HCT 32.8* 36.9 34.9*  MCV 88.4 85.8 86.6  PLT 208 210 193   Lab Results  Component Value Date   TSH 1.454 04/10/2015   Lab Results  Component Value Date   HGBA1C 6.0 (H) 03/09/2016   Lab Results  Component Value Date   CHOL 203 (H) 03/09/2016   HDL 101 03/09/2016   LDLCALC 93 03/09/2016   TRIG 45 03/09/2016   CHOLHDL 2.0 03/09/2016    Significant Diagnostic Results in last 30 days:  No results found.  Assessment/Plan Hypertension Will increase the Hydralazine to 75 mg TID.  Continue to monitor BP  Chronic renal Insufficiency Creat just reviewed is stable. Diastolic CHF Weight stable. Patient continues to do well on Lasix. Hyperlipidemia LDL less 100. On Lovastatin Dementia with Anxiety Continue on Aricept and Ativan. Followed by Psychiatry and is stable. Also on Celexa. Chronic Venous stasis Continue Ted hose. And Lasix Family/ staff Communication:   Labs/tests ordered:

## 2016-11-04 ENCOUNTER — Non-Acute Institutional Stay (SKILLED_NURSING_FACILITY): Payer: Medicare Other | Admitting: Internal Medicine

## 2016-11-04 ENCOUNTER — Encounter: Payer: Self-pay | Admitting: Internal Medicine

## 2016-11-04 DIAGNOSIS — I878 Other specified disorders of veins: Secondary | ICD-10-CM | POA: Diagnosis not present

## 2016-11-04 DIAGNOSIS — N183 Chronic kidney disease, stage 3 unspecified: Secondary | ICD-10-CM

## 2016-11-04 DIAGNOSIS — R600 Localized edema: Secondary | ICD-10-CM

## 2016-11-04 NOTE — Progress Notes (Signed)
Location:   [facility] Room Number: 122/D Place of Service:  SNF (31) Provider:  Janyce Llanos, PA-C  Patient Care Team: Wille Celeste, PA-C as PCP - General (Internal Medicine) [name], MD as Consulting Physician (Geriatric Medicine)  Extended Emergency Contact Information Primary Emergency Contact: [name], [place] [address] [name] of [place] Phone: [phone] Relation: Daughter Secondary Emergency Contact: [name] [place] of [place] Phone: [phone] Relation: Son  Code Status:  DNR Goals of care: Advanced Directive information Advanced Directives 11/04/2016  Does Patient Have a Medical Advance Directive? Yes  Type of Advance Directive Out of facility DNR (pink MOST or yellow form)  Does patient want to make changes to medical advance directive? No - Patient declined  Copy of [place] in Chart? -  Pre-existing out of facility DNR order (yellow form or pink MOST form) -     Chief Complaint  Patient presents with  . Acute Visit    Both legs swollen and black    HPI:  Pt is a 83 y.o. female seen today for an acute visit for Patient being concerned that his legs are swollen and dry.  She does have a history of venous stasis changes as well as persistent lower extremity edema.  She is on Lasix 40 mg a day with potassium supplementation appears she has slowly gaining some weight baseline appears to be in the 1 9192 range Most recently was 196.4 previously was 194.2 earlier this month.  She does not complain of any increased shortness of breath cough or chest congestion  She does have a history of renal insufficiency creatinine of 1.39 actually generally appears to be relatively baseline for her  He does have a history of hypertension recent blood pressures appear to be somewhat elevated ones 1/[id number] systolics 983J to [id number]-[id number] is on hydralazine 75  mg 3 times a day this was recently increased by Dr. Lyndel Safe also Norvasc 5 mg a day-Lotensin 40 mg a day-and admission to the Lasix 40 mg a day.       Past Medical History:  Diagnosis Date  . Anxiety   . Cellulitis 2016  . CHF (congestive heart failure) ([place])   . Chronic kidney disease   . Dementia   . Dysphagia   . Gait abnormality   . GERD (gastroesophageal reflux disease)   . Glaucoma   . Gout   . Hyperlipidemia   . Hypertension   . Muscle weakness   . Myocardial infarction 1990s   [place]  . Osteoarthritis   . Syncope    Past Surgical History:  Procedure Laterality Date  . APPENDECTOMY    . CATARACT EXTRACTION W/PHACO  08/04/2011   Procedure: CATARACT EXTRACTION PHACO AND INTRAOCULAR LENS PLACEMENT (IOC);  Surgeon: Tonny Branch;  Location: [facility];  Service: Ophthalmology;  Laterality: Right;  CDE=18.53  . CATARACT EXTRACTION W/PHACO  08/25/2011   Procedure: CATARACT EXTRACTION PHACO AND INTRAOCULAR LENS PLACEMENT (IOC);  Surgeon: Tonny Branch;  Location: [facility];  Service: Ophthalmology;  Laterality: Left;  CDE:14.76  . TONSILLECTOMY      Allergies  Allergen Reactions  . Bee Venom Anaphylaxis  . Aspirin     Directed by physician  . Cabbage Other (See Comments)    Certain foods due to gout flares  . Latex Rash    Current Outpatient Prescriptions on File Prior to Visit  Medication Sig  Dispense Refill  . acetaminophen (TYLENOL) 500 MG tablet Take 1,000 mg by mouth 2 (two) times daily. For leg pain and arthritis. May have additional 1000 mg for break through pain    . amLODipine (NORVASC) 5 MG tablet Take 5 mg by mouth daily.    . benazepril (LOTENSIN) 40 MG tablet Take 40 mg by mouth daily.    . Brinzolamide-Brimonidine (SIMBRINZA) 1-0.2 % SUSP Apply to eye. One  drop into both eyes three times a day    . Calcium Carbonate-Vitamin D (CALCIUM-VITAMIN D) 500-200 MG-UNIT tablet Take 1 tablet by mouth 2 (two) times daily.    . citalopram (CELEXA) 20 MG tablet Take 20 mg by  mouth daily.    . cycloSPORINE (RESTASIS) 0.05 % ophthalmic emulsion Place 1 drop into both eyes 2 (two) times daily.    Marland Kitchen donepezil (ARICEPT) 10 MG tablet Take 10 mg by mouth at bedtime. For dementia    . furosemide (LASIX) 40 MG tablet Give 40 mg only on day 3 and there after    . ipratropium-albuterol (DUONEB) 0.5-2.5 (3) MG/3ML SOLN Take 3 mLs by nebulization every 6 (six) hours as needed.    . loratadine (CLARITIN) 10 MG tablet Take 10 mg by mouth daily.    Marland Kitchen LORazepam (ATIVAN) 0.5 MG tablet Take 0.5 tablets (0.25 mg total) by mouth 2 (two) times daily. 30 tablet 5  . lovastatin (MEVACOR) 40 MG tablet Take 40 mg by mouth at bedtime.    . nitroGLYCERIN (NITROSTAT) 0.4 MG SL tablet Place 0.4 mg under the tongue every 5 (five) minutes as needed for chest pain.     . pantoprazole (PROTONIX) 40 MG tablet Take 40 mg by mouth daily.     Marland Kitchen POTASSIUM PO Take 10 meq by mouth daily    . Travoprost, BAK Free, (TRAVATAN) 0.004 % SOLN ophthalmic solution Place 1 drop into both eyes at bedtime. For glaucoma     No current facility-administered medications on file prior to visit.      Review of Systems   General no complaints of fever chills  Skin does not complain of rashes or itching.--(Is concerned about darkening of her lower legs  Head ears eyes nose mouth and throat does not complain of any visual changes or sore throat she does have some history of allergic rhinitisis on Claritin   Respiratory  Is not really complaining of any shortness of breath beyond baseline  Cardiac does not complain of chest pain or palpitations haschronic lower extremity edema-  GI does not complain of abdominal pain nausea vomiting diarrhea constipation.  Muscle skeletal does have a history of back pain and osteoarthritis but this appears to be fairly well controlled on Tylenol.-  Neurologic does not complain of dizziness or numbness.  Psych history of anxiety and depression and mood disorder  this is been relatively stable she does  Continue to  have increased anxiety.At times  Immunization History  Administered Date(s) Administered  . Influenza Whole 06/11/2007, 10/16/2008, 05/21/2009, 05/05/2010  . Influenza-Unspecified 06/05/2014, 06/02/2016  . Pneumococcal Polysaccharide-23 06/11/2007  . Pneumococcal-Unspecified 06/06/2016  . Td 06/11/2007  . Zoster 05/21/2007   Pertinent  Health Maintenance Due  Topic Date Due  . PNA vac Low Risk Adult (2 of 2 - PCV13) 06/06/2017  . INFLUENZA VACCINE  Completed  . DEXA SCAN  Completed   No flowsheet data found. Functional Status Survey:   Temperature is 98.2 pulse 68 respirations 20 blood pressure 168/70 O2 sat 96% on room air  Physical Exam   thisis a pleasant elderly female in no distress. Sitting comfortably in her wheelchair  Her skin is warm and dry.Venous stasis changes lower extremities bilaterally appears to have some skin buildup which appears to be increased darkness I do not appreciate any increased erythema or warmth  Eyes pupils appear equal round reactive light visual acuity appears grossly intact..    Oropharynx is clear mucous membranes moist.--  Chest --- no labored breathing.-Clear to auscultation at times will have a few coarse breath sounds but don't really hear that today   Heart is regular rate and rhythm without murmur gallop or rub -she has chronic venous stasis changes bilaterally lower extremities as noted above  Abdomen is obese soft nontender positive bowel sounds.  Muscle skeletal moves all extremities at baseline is ambulating in a wheelchair-do not note any acute deformities she does have arthritic changes continues have some pain with palpitation of her legs but this is not new.    Neurologic is grossly intact her speech is clear I don't see any focal weaknesses cranial nerves are grossly intact.  Psych she is oriented to self she is pleasant conversant   Labs  reviewed:  Recent Labs  05/30/16 0600 07/28/16 0500 09/27/16 0715  NA 139 141 138  K 4.2 4.2 3.8  CL 104 107 102  CO2 28 27 26   GLUCOSE 96 98 116*  BUN 44* 43* 29*  CREATININE 1.41* 1.27* 1.39*  CALCIUM 9.3 9.3 9.2    Recent Labs  02/11/16 0740 09/27/16 0715  AST 15 20  ALT 13* 15  ALKPHOS 98 57  BILITOT 0.6 0.5  PROT 7.2 7.1  ALBUMIN 3.8 3.9    Recent Labs  05/21/16 0615 05/30/16 0600 09/27/16 0715  WBC 5.9 6.0 4.2  NEUTROABS 3.1 3.2 2.6  HGB 10.4* 12.0 11.3*  HCT 32.8* 36.9 34.9*  MCV 88.4 85.8 86.6  PLT 208 210 193   Lab Results  Component Value Date   TSH 1.454 04/10/2015   Lab Results  Component Value Date   HGBA1C 6.0 (H) 03/09/2016   Lab Results  Component Value Date   CHOL 203 (H) 03/09/2016   HDL 101 03/09/2016   LDLCALC 93 03/09/2016   TRIG 45 03/09/2016   CHOLHDL 2.0 03/09/2016    Significant Diagnostic Results in last 30 days:  No results found.  Assessment/Plan  History of lower extremity edema with venous stasis changes-I have spoken with nursing they will try to do more aggressive soaks here to see if they can exfoliate some of his skin which I suspect is leading to the darker appearance I don't really see evidence of cellulitis here for an acute process otherwise but this will have to be watched.  She has had some mild weight gain will increase her Lasix to 60 mg a day for a couple days continue to monitor weights closely also will increase her potassium slightly will update a metabolic panel on Monday to ensure stability of her renal function with a history of some renal insufficiency.  Clinically she appears to be stable here and relatively at baseline respiratory status appears to be stable.  In regards to hypertension she still at times has elevated systolics in the [id number] was recently increased To 75 mg 3 times a day she is also on Norvasc 5 mg a day and Lotensin 40 mg a day-again will increase the Lasix for a few  days will see if this has any effect on her blood pressure  is well also will need to update a metabolic panel again on Monday to keep an eye on her renal function and electrolytes.  [id number]

## 2016-11-07 ENCOUNTER — Encounter (HOSPITAL_COMMUNITY)
Admission: RE | Admit: 2016-11-07 | Discharge: 2016-11-07 | Disposition: A | Discharge status: Home / Self Care | Payer: Medicare Other | Source: Skilled Nursing Facility | Attending: Internal Medicine | Admitting: Internal Medicine

## 2016-11-07 DIAGNOSIS — R609 Edema, unspecified: Secondary | ICD-10-CM | POA: Diagnosis not present

## 2016-11-07 LAB — BASIC METABOLIC PANEL
ANION GAP: 8 (ref 5–15)
BUN: 43 mg/dL — AB (ref 6–20)
CHLORIDE: 105 mmol/L (ref 101–111)
CO2: 28 mmol/L (ref 22–32)
Calcium: 9.3 mg/dL (ref 8.9–10.3)
Creatinine, Ser: 1.3 mg/dL — ABNORMAL HIGH (ref 0.44–1.00)
GFR calc Af Amer: 40 mL/min — ABNORMAL LOW (ref >60)
GFR calc non Af Amer: 35 mL/min — ABNORMAL LOW (ref >60)
Glucose, Bld: 97 mg/dL (ref 65–99)
POTASSIUM: 4.1 mmol/L (ref 3.5–5.1)
SODIUM: 141 mmol/L (ref 135–145)

## 2016-12-08 ENCOUNTER — Non-Acute Institutional Stay (SKILLED_NURSING_FACILITY): Payer: Medicare Other | Admitting: Internal Medicine

## 2016-12-08 DIAGNOSIS — F411 Generalized anxiety disorder: Secondary | ICD-10-CM | POA: Diagnosis not present

## 2016-12-08 DIAGNOSIS — I10 Essential (primary) hypertension: Secondary | ICD-10-CM | POA: Diagnosis not present

## 2016-12-08 DIAGNOSIS — E785 Hyperlipidemia, unspecified: Secondary | ICD-10-CM | POA: Diagnosis not present

## 2016-12-08 DIAGNOSIS — N183 Chronic kidney disease, stage 3 unspecified: Secondary | ICD-10-CM

## 2016-12-08 DIAGNOSIS — K219 Gastro-esophageal reflux disease without esophagitis: Secondary | ICD-10-CM

## 2016-12-08 DIAGNOSIS — F0391 Unspecified dementia with behavioral disturbance: Secondary | ICD-10-CM

## 2016-12-08 NOTE — Progress Notes (Signed)
This is a routine visit.  Level care skilled.  Facility is [facility].  Chief complaint routine visit for medical management of chronic medical issues including dementia-hypertension-diastolic CHF-GERD-anxiety-chronic kidney disease-GERD-.  History of present illness.  Patient is a pleasant 83 year old female with the above diagnoses-most acute issue today is stating that she has GERD-like symptoms and gagging-she at times will complain of this she has been worked up with GI in the past and she is on Protonix.  She did state however that she ate lunch which was barbecuqe  tolerated this well.  Also in it appears there is more an anxiety component to this and actually by the time I was finished examining her she appeared to be doing fine and was looking forward to eating supper but this will have to be watched.  Her other medical issues appear to be stable she does have a history of hypertension I see listed systolics sometimes elevated the 170s to 180s I to go with a large coffee since she has large arm and got 136/46 again will monitor she is on numerous medications hydralazine which was recently increased several weeks ago up to 75 mg 3 times a day she is also on Lotensin 40 mg a day Norvasc 5 mg a day she is also on Lasix 40 mg a day.  She does have a history of diastolic CHF again she is on Lasix with potassium supplementation weight appears to be relatively stabilized in the higher 190s recently edema and venous stasis appears to be at baseline.  In regards to dementia this appears to be moderate she is doing well with supportive care she is on Aricept-this is complicated with a history of anxiety which requires Ativan twice a day and appears to help it appears she does really need this.  She also has a distant history of a possible distal femoral DVT-initial Doppler showed small amount of age indeterminate possibly acute nonocclusive DVT in the distal left femoral vein-she was on  Eliquis for prolonged course-this was attempted be switched to aspirin but she stated she had an aspirin allergy and refused it-nonetheless we did do an updated Doppler which actually did not show a DVT which was reassuring   She also has a history of hyperlipidemia back in July LDL was 93 or HDL has been consistently elevated it was 101 she is on lovastatin.  Regards chronic kidney disease her most recent creatinine was 1.3 which is relatively baseline for her.  Currently she is sitting in her wheelchair comfortably was a bit anxious when I first evaluated her stating she was gagging at times but this appeared to actually resolved while I was in the room again I suspect there is some anxiety related to this she is on Protonix with a history of GERD.   Past Medical History:  Diagnosis Date  . Anxiety   . Cellulitis 2016  . CHF (congestive heart failure) ([place])   . Chronic kidney disease   . Dementia   . Dysphagia   . Gait abnormality   . GERD (gastroesophageal reflux disease)   . Glaucoma   . Gout   . Hyperlipidemia   . Hypertension   . Muscle weakness   . Myocardial infarction 1990s   [place]  . Osteoarthritis   . Syncope         Past Surgical History:  Procedure Laterality Date  . APPENDECTOMY    . CATARACT EXTRACTION W/PHACO  08/04/2011   Procedure: CATARACT EXTRACTION PHACO  AND INTRAOCULAR LENS PLACEMENT (IOC);  Surgeon: Tonny Branch;  Location: [facility];  Service: Ophthalmology;  Laterality: Right;  CDE=18.53  . CATARACT EXTRACTION W/PHACO  08/25/2011   Procedure: CATARACT EXTRACTION PHACO AND INTRAOCULAR LENS PLACEMENT (IOC);  Surgeon: Tonny Branch;  Location: [facility];  Service: Ophthalmology;  Laterality: Left;  CDE:14.76  . TONSILLECTOMY           Allergies  Allergen Reactions  . Bee Venom Anaphylaxis  . Aspirin     Directed by physician  . Cabbage Other (See Comments)    Certain foods due to gout flares  . Latex Rash         Current  Outpatient Prescriptions on File Prior to Visit  Medication Sig Dispense Refill  . acetaminophen (TYLENOL) 500 MG tablet Take 1,000 mg by mouth 2 (two) times daily. For leg pain and arthritis. May have additional 1000 mg for break through pain    . amLODipine (NORVASC) 5 MG tablet Take 5 mg by mouth daily.    . benazepril (LOTENSIN) 40 MG tablet Take 40 mg by mouth daily.    . Brinzolamide-Brimonidine (SIMBRINZA) 1-0.2 % SUSP Apply to eye. One  drop into both eyes three times a day    . Calcium Carbonate-Vitamin D (CALCIUM-VITAMIN D) 500-200 MG-UNIT tablet Take 1 tablet by mouth 2 (two) times daily.    . citalopram (CELEXA) 20 MG tablet Take 20 mg by mouth daily.    . cycloSPORINE (RESTASIS) 0.05 % ophthalmic emulsion Place 1 drop into both eyes 2 (two) times daily.    Marland Kitchen donepezil (ARICEPT) 10 MG tablet Take 10 mg by mouth at bedtime. For dementia    . furosemide (LASIX) 40 MG tablet Give 40 mg a day    . hydrALAZINE (APRESOLINE) 25 MG tablet Take 50 mg by mouth 3 (three) times daily. Along with 25 mg to = 75 mg    . ipratropium-albuterol (DUONEB) 0.5-2.5 (3) MG/3ML SOLN Take 3 mLs by nebulization every 6 (six) hours as needed.    . loratadine (CLARITIN) 10 MG tablet Take 10 mg by mouth daily.    Marland Kitchen LORazepam (ATIVAN) 0.5 MG tablet Take 0.5 tablets (0.25 mg total) by mouth 2 (two) times daily. 30 tablet 5  . lovastatin (MEVACOR) 40 MG tablet Take 40 mg by mouth at bedtime.    . nitroGLYCERIN (NITROSTAT) 0.4 MG SL tablet Place 0.4 mg under the tongue every 5 (five) minutes as needed for chest pain.     . pantoprazole (PROTONIX) 40 MG tablet Take 40 mg by mouth daily.     Marland Kitchen POTASSIUM PO Take 10 meq by mouth daily    . Travoprost, BAK Free, (TRAVATAN) 0.004 % SOLN ophthalmic solution Place 1 drop into both eyes at bedtime. For glaucoma     No current facility-administered medications on file prior to visit.     Review of Systems  Constitutional: Positive for  fatigue. Negative for appetite change, chills and fever.  HENT: . Negative for sinus pain and sinus pressure. A residual changes   Respiratory: . Negative for chest tightness, shortness of breath and wheezing.   Cardiovascular: Positive for leg swelling. Negative for chest pain and palpitations.  Gastrointestinal: Positive for abdominal distention. Negative for abdominal pain, constipation and diarrhea Complains of gagging at times as noted above.  Genitourinary: Negative for dysuria and urgency.  Musculoskeletal: Positive for back pain and neck pain. Negative for arthralgias.  Skin: Positive for color change. Negative for pallor and rash. Has history  of venous stasis Neurological: Positive for weakness. Negative for dizziness, seizures, speech difficulty and light-headedness.  Psychiatric/Behavioral: Positive for sleep disturbance. Negative for agitation, decreased concentration and dysphoric mood. The patient is nervous/anxious.         Immunization History  Administered Date(s) Administered  . Influenza Whole 06/11/2007, 10/16/2008, 05/21/2009, 05/05/2010  . Influenza-Unspecified 06/05/2014, 06/02/2016  . Pneumococcal Polysaccharide-23 06/11/2007  . Pneumococcal-Unspecified 06/06/2016  . Td 06/11/2007  . Zoster 05/21/2007       Pertinent  Health Maintenance Due  Topic Date Due  . PNA vac Low Risk Adult (2 of 2 - PCV13) 06/06/2017  . INFLUENZA VACCINE  Completed  . DEXA SCAN  Completed    Physical  exam.  She is afebrile pulse of 62 respirations of 18 blood pressure 136/46 done manually weight is 196.8 this appears relatively stable  thisis a pleasant elderly female in no distress. Sitting comfortably in her wheelchair-initially appeared anxious but by and of exam appear to be quite calm talking about supper  Her skin is warm and dry.Venous stasis changes lower extremities bilaterally  I do not appreciate any increased erythema or warmth  Eyes pupils appear equal  round reactive light visual acuity appears grossly intact..    Oropharynx is clear mucous membranes moist.--  Chest --- no labored breathing.-Clear to auscultation at times will have a few coarse breath sounds but I don't hear that today  Heart is regular rate and rhythm without murmur gallop or rub -she has chronic venous stasis changes bilaterally lower extremities as noted above edema appears to be at baseline  Abdomen is obese soft nontender positive bowel sounds. Baseline exam  Muscle skeletal moves all extremities at baseline is ambulating in a wheelchair-do not note any acute deformities she does have arthritic changes continues have some pain with palpitation of her legs but this is not new.    Neurologic is grossly intact her speech is clear I don't see any focal weaknesses cranial nerves are grossly intact.  Psych she is oriented to self she is pleasant conversant initially somewhat anxious but as we talked became less anxious   Labs.  11/07/2016.  Sodium 141 potassium 4.1 BUN 43 creatinine 1.3.  09/27/2016.  Liver function tests within normal limits of note albumin was 3.9.  WBC 4.2 hemoglobin 11.3 platelets 193.  July 2017.  Cholesterol was 203-triglycerides 45-HDL 101-LDL of 93   Assessment and plan.  #1 hypertension she has variable systolic readings however manual done with a large cough appeared fairly stable at 136/46 continue to monitor she is on hydralazine 75 mg 3 times a day-Lotensin 40 mg a day-Norvasc 5 mg a day-in addition to Lasix 40 mg a day.  #2 history of dementia this appears stable with supportive care she is on Aricept appears to be doing quite well she is a long-term resident of this facility and appears to have fitting quite well.  #3 history of diastolic CHF weight appears stable on the Lasix as well as potassium supplementation Will update a BMP very  #4 history of chronic kidney disease creatinine appears relatively  baseline at 1.3 we will update this especially since she is on Lasix.  #5 history of distal DVT left femoral vein-as noted above repeat Doppler did not really show a clot she is not on anticoagulation there were attempts to start her on aspirin after completing Eliquis but she refuses this saying she has an allergy.  #6-history of GERD-again this at times become somewhat over reoccurring issue  I suspect her anxiety contributes somewhat to this as well she has been followed by GI and started on Protonix generally this appears to be doing well again she appeared to be calmer and doing better after I left the room but this will have to be watched she did eat her lunch and apparently enjoyed it and it was barbecue she has been advised to try to avoid acid type foods if possible nursing did speak with her in the room today about this  -she has had a GI consult-they ordered a CT scan of the abdomen which did not show any acute process and  showed colonic diverticulosis without evidence of acute diverticulitis-also a fibroid uterus In 2012 she did have an EGD which basically showed age-related esophageal motility decline but no acute issue  #7-history of glaucoma this appears baseline she is on topical eyedrops.  #8 history of anxiety and depression she is on Celexa-she is also on Ativan twice a day anxiety at times is an issue but this appears to be relatively well controlled with the Ativan.  #9 history of hyperlipidemia again LDL continues to be under 100 although her HDL is slightly above 100 will monitor at periodic intervals she is on lovastatin liver function tests back in general he were unremarkable.  #10 history of anemia suspect there is an element of chronic disease here will update a CBC as well.  [id number] note greater than 40 minutes spent assessing patient-discussing her concerns at bedside as well as with nursing which was also at bedside-reviewing her chart-her labs-and coordinating  and formulating a plan of care for numerous diagnoses-no greater than 50% of time spent coordinating plan of care

## 2016-12-12 ENCOUNTER — Encounter (HOSPITAL_COMMUNITY)
Admission: RE | Admit: 2016-12-12 | Discharge: 2016-12-12 | Disposition: A | Discharge status: Home / Self Care | Payer: Medicare Other | Source: Skilled Nursing Facility | Attending: Internal Medicine | Admitting: Internal Medicine

## 2016-12-12 DIAGNOSIS — R609 Edema, unspecified: Secondary | ICD-10-CM | POA: Insufficient documentation

## 2016-12-12 DIAGNOSIS — F339 Major depressive disorder, recurrent, unspecified: Secondary | ICD-10-CM | POA: Insufficient documentation

## 2016-12-12 DIAGNOSIS — K210 Gastro-esophageal reflux disease with esophagitis: Secondary | ICD-10-CM | POA: Diagnosis not present

## 2016-12-12 DIAGNOSIS — I509 Heart failure, unspecified: Secondary | ICD-10-CM | POA: Insufficient documentation

## 2016-12-12 DIAGNOSIS — F419 Anxiety disorder, unspecified: Secondary | ICD-10-CM | POA: Insufficient documentation

## 2016-12-12 LAB — CBC WITH DIFFERENTIAL/PLATELET
BASOS PCT: 0 %
Basophils Absolute: 0 10*3/uL (ref 0.0–0.1)
EOS ABS: 0.1 10*3/uL (ref 0.0–0.7)
EOS PCT: 3 %
HCT: 34.8 % — ABNORMAL LOW (ref 36.0–46.0)
HEMOGLOBIN: 11.3 g/dL — AB (ref 12.0–15.0)
LYMPHS ABS: 1.5 10*3/uL (ref 0.7–4.0)
Lymphocytes Relative: 29 %
MCH: 27.8 pg (ref 26.0–34.0)
MCHC: 32.5 g/dL (ref 30.0–36.0)
MCV: 85.5 fL (ref 78.0–100.0)
MONO ABS: 0.7 10*3/uL (ref 0.1–1.0)
MONOS PCT: 13 %
NEUTROS PCT: 55 %
Neutro Abs: 2.9 10*3/uL (ref 1.7–7.7)
Platelets: 227 10*3/uL (ref 150–400)
RBC: 4.07 MIL/uL (ref 3.87–5.11)
RDW: 16 % — AB (ref 11.5–15.5)
WBC: 5.2 10*3/uL (ref 4.0–10.5)

## 2016-12-12 LAB — BASIC METABOLIC PANEL
Anion gap: 7 (ref 5–15)
BUN: 38 mg/dL — ABNORMAL HIGH (ref 6–20)
CALCIUM: 9.3 mg/dL (ref 8.9–10.3)
CHLORIDE: 103 mmol/L (ref 101–111)
CO2: 29 mmol/L (ref 22–32)
CREATININE: 1.38 mg/dL — AB (ref 0.44–1.00)
GFR calc Af Amer: 37 mL/min — ABNORMAL LOW (ref >60)
GFR calc non Af Amer: 32 mL/min — ABNORMAL LOW (ref >60)
Glucose, Bld: 91 mg/dL (ref 65–99)
Potassium: 3.9 mmol/L (ref 3.5–5.1)
SODIUM: 139 mmol/L (ref 135–145)

## 2016-12-20 DIAGNOSIS — H401134 Primary open-angle glaucoma, bilateral, indeterminate stage: Secondary | ICD-10-CM | POA: Diagnosis not present

## 2016-12-20 DIAGNOSIS — H04123 Dry eye syndrome of bilateral lacrimal glands: Secondary | ICD-10-CM | POA: Diagnosis not present

## 2016-12-20 DIAGNOSIS — Z961 Presence of intraocular lens: Secondary | ICD-10-CM | POA: Diagnosis not present

## 2016-12-22 ENCOUNTER — Non-Acute Institutional Stay (SKILLED_NURSING_FACILITY): Payer: Medicare Other | Admitting: Internal Medicine

## 2016-12-22 DIAGNOSIS — K219 Gastro-esophageal reflux disease without esophagitis: Secondary | ICD-10-CM

## 2016-12-22 DIAGNOSIS — I259 Chronic ischemic heart disease, unspecified: Secondary | ICD-10-CM

## 2016-12-22 DIAGNOSIS — F411 Generalized anxiety disorder: Secondary | ICD-10-CM | POA: Diagnosis not present

## 2016-12-23 ENCOUNTER — Encounter: Payer: Self-pay | Admitting: Internal Medicine

## 2016-12-23 NOTE — Progress Notes (Signed)
Location:   [facility] Room Number: 122/D Place of Service:  SNF (31) Provider:  Janyce Llanos, PA-C  Patient Care Team: Wille Celeste, PA-C as PCP - General (Internal Medicine) [name], MD as Consulting Physician (Geriatric Medicine)  Extended Emergency Contact Information Primary Emergency Contact: [name],  [id number] [name] of [place] Phone: [phone] Relation: Daughter Secondary Emergency Contact: [name] [place] of [place] Phone: [phone] Relation: Son  Code Status:  DNR Goals of care: Advanced Directive information Advanced Directives 12/23/2016  Does Patient Have a Medical Advance Directive? Yes  Type of Advance Directive Out of facility DNR (pink MOST or yellow form)  Does patient want to make changes to medical advance directive? No - Patient declined  Copy of [place] in Chart? -  Pre-existing out of facility DNR order (yellow form or pink MOST form) -     Chief Complaint  Patient presents with  . Acute Visit    Chest Pain    HPI:  Pt is a 83 y.o. female seen today for an acute visit for Possible chest pain.  Patient has a history of hypertension and diastolic CHF-she also has a history of a distal lower extremity DVT and had been on Eliquis at one point-there was no attempt to start aspirin after Eliquis therapy was complete however patient has refused aspirin stating she has an allergy to it although this appears to be quite unclear exactly what the reaction to have hernias.  Nonetheless she continues to be quite stable she does have when necessary Nitro order for chest pain-apparently occasional uses this. She also has a history of significant anxiety is on Ativan twice a day.  At times this contributes to her pain complaints ias well  When I passed her in the hall this evening she asked for a nitroglycerin saying she was having some  chest pain.  On evaluation she did not appear to be in any distress no diaphoresis complaints shortness of breath or radiation of the pain-she stated she ate some barbecue chicken earlier the day and did not think it was agreeing with her.  But she did not want anything for GERD which she has she says she needed some Nitrol.  Her vital signs were stable and nursing did give her one Nitro and I did recheck her proximally 15 minutes later and she was pain-free.  Vital signs continued to be stable blood pressure is 132/63-.  She does continue on a statin again she has refused aspirin in the past nonetheless she appears to be stable.       Past Medical History:  Diagnosis Date  . Anxiety   . Cellulitis 2016  . CHF (congestive heart failure) ([place])   . Chronic kidney disease   . Dementia   . Dysphagia   . Gait abnormality   . GERD (gastroesophageal reflux disease)   . Glaucoma   . Gout   . Hyperlipidemia   . Hypertension   . Muscle weakness   . Myocardial infarction [facility]) [place]  . Osteoarthritis   . Syncope    Past Surgical History:  Procedure Laterality Date  . APPENDECTOMY    . CATARACT EXTRACTION W/PHACO  08/04/2011   Procedure: CATARACT EXTRACTION PHACO AND INTRAOCULAR LENS PLACEMENT (IOC);  Surgeon: Tonny Branch;  Location: [facility];  Service: Ophthalmology;  Laterality: Right;  CDE=18.53  .  CATARACT EXTRACTION W/PHACO  08/25/2011   Procedure: CATARACT EXTRACTION PHACO AND INTRAOCULAR LENS PLACEMENT (IOC);  Surgeon: Tonny Branch;  Location: [facility];  Service: Ophthalmology;  Laterality: Left;  CDE:14.76  . TONSILLECTOMY      Allergies  Allergen Reactions  . Bee Venom Anaphylaxis  . Aspirin     Directed by physician  . Cabbage Other (See Comments)    Certain foods due to gout flares  . Latex Rash    Outpatient Encounter Prescriptions as of 12/22/2016  Medication Sig  . acetaminophen (TYLENOL) 500 MG tablet Take 1,000 mg by mouth 2 (two) times daily. For leg  pain and arthritis. May have additional 1000 mg for break through pain  . amLODipine (NORVASC) 5 MG tablet Take 5 mg by mouth daily.  . benazepril (LOTENSIN) 40 MG tablet Take 40 mg by mouth daily.  . Brinzolamide-Brimonidine (SIMBRINZA) 1-0.2 % SUSP Apply to eye. One  drop into both eyes three times a day  . Calcium Carbonate-Vitamin D (CALCIUM-VITAMIN D) 500-200 MG-UNIT tablet Take 1 tablet by mouth 2 (two) times daily.  . citalopram (CELEXA) 20 MG tablet Take 20 mg by mouth daily.  . cycloSPORINE (RESTASIS) 0.05 % ophthalmic emulsion Place 1 drop into both eyes 2 (two) times daily.  Marland Kitchen donepezil (ARICEPT) 10 MG tablet Take 10 mg by mouth at bedtime. For dementia  . furosemide (LASIX) 40 MG tablet Give 40 mg only on day 3 and there after  . hydrALAZINE (APRESOLINE) 25 MG tablet Take 25 g by mouth along with 50 mg to equal 75 mg three times day  . hydrALAZINE (APRESOLINE) 50 MG tablet Take 50 mg by mouth along with 25 mg to equal 75 mg three times a day  . ipratropium-albuterol (DUONEB) 0.5-2.5 (3) MG/3ML SOLN Take 3 mLs by nebulization every 6 (six) hours as needed.  . loratadine (CLARITIN) 10 MG tablet Take 10 mg by mouth daily.  Marland Kitchen LORazepam (ATIVAN) 0.5 MG tablet Take 0.5 tablets (0.25 mg total) by mouth 2 (two) times daily.  Marland Kitchen lovastatin (MEVACOR) 40 MG tablet Take 40 mg by mouth at bedtime.  . nitroGLYCERIN (NITROSTAT) 0.4 MG SL tablet Place 0.4 mg under the tongue every 5 (five) minutes as needed for chest pain.   . pantoprazole (PROTONIX) 40 MG tablet Take 40 mg by mouth daily.   Marland Kitchen POTASSIUM PO Take 10 meq by mouth daily  . Travoprost, BAK Free, (TRAVATAN) 0.004 % SOLN ophthalmic solution Place 1 drop into both eyes at bedtime. For glaucoma   No facility-administered encounter medications on file as of 12/22/2016.     Review of Systems   General does not complaining of fever chills.  Skin is not diaphoretic does not complain of rashes or itching.  Head ears eyes nose mouth and  throat does not complain of visual changes or sore throat.  Respiratory denying any shortness of breath or increased cough from baseline.  Cardiac initially complained of some midsternal chest pain again this resolved fairly quickly with one nitroglycerin.  GI does have a history of GERD with indigestion is on Protonix does not complaining of abdominal discomfort nausea or vomiting.  Muscle skeletal at this point not complaining of joint pain has some chronic leg discomfort when palpated.  GU does not complain of dysuria.   It is not complaining of dizziness headache or syncope at this time.  Psych does have a history of anxiety  Immunization History  Administered Date(s) Administered  . Influenza Whole 06/11/2007, 10/16/2008, 05/21/2009,  05/05/2010  . Influenza-Unspecified 06/05/2014, 06/02/2016  . Pneumococcal Polysaccharide-23 06/11/2007  . Pneumococcal-Unspecified 06/06/2016  . Td 06/11/2007  . Zoster 05/21/2007   Pertinent  Health Maintenance Due  Topic Date Due  . INFLUENZA VACCINE  03/29/2017  . PNA vac Low Risk Adult (2 of 2 - PCV13) 06/06/2017  . DEXA SCAN  Completed   No flowsheet data found. Functional Status Survey:    Vitals:   12/23/16 1611  BP: 132/63  Pulse: 67  Resp: 18  On initial evaluation systolic blood pressure was 105  Physical Exam  thisis a pleasant elderly female in no distress.Initially was complaining of chest pain but appear to be in no distress did appear to be a bit anxious.  On reevaluation she appeared to be less anxious said the chest pain was essentially gone  Her skin is warm and dry.Venous stasis changes lower extremities bilaterally  I do not appreciate any increased erythema or warmth  Eyes pupils appear equal round reactive light visual acuity appears grossly intact..    Oropharynx is clear mucous membranes moist.--  Chest --- no labored breathing.-Clear to auscultation with somewhat shallow air  entry  Heart is regular rate and rhythm without murmur gallop or rub -she has chronic venous stasis changes bilaterally lower extremitiesas noted above edema appears to be at baseline  Abdomen is obese soft nontender positive bowel sounds. Baseline exam  Muscle skeletal moves all extremities at baseline is ambulating in a wheelchair-do not note any acute deformities she does have arthritic changes continues have some pain with palpitation of her legs but this is not new.    Neurologic is grossly intact her speech is clear I don't see any focal weaknesses cranial nerves are grossly intact.  Psych she is oriented to self she is pleasant conversant  appear to bit anxious initially had less so on reevaluation  Labs reviewed:  Recent Labs  09/27/16 0715 11/07/16 0700 12/12/16 0800  NA 138 141 139  K 3.8 4.1 3.9  CL 102 105 103  CO2 26 28 29   GLUCOSE 116* 97 91  BUN 29* 43* 38*  CREATININE 1.39* 1.30* 1.38*  CALCIUM 9.2 9.3 9.3    Recent Labs  02/11/16 0740 09/27/16 0715  AST 15 20  ALT 13* 15  ALKPHOS 98 57  BILITOT 0.6 0.5  PROT 7.2 7.1  ALBUMIN 3.8 3.9    Recent Labs  05/30/16 0600 09/27/16 0715 12/12/16 0800  WBC 6.0 4.2 5.2  NEUTROABS 3.2 2.6 2.9  HGB 12.0 11.3* 11.3*  HCT 36.9 34.9* 34.8*  MCV 85.8 86.6 85.5  PLT 210 193 227   Lab Results  Component Value Date   TSH 1.454 04/10/2015   Lab Results  Component Value Date   HGBA1C 6.0 (H) 03/09/2016   Lab Results  Component Value Date   CHOL 203 (H) 03/09/2016   HDL 101 03/09/2016   LDLCALC 93 03/09/2016   TRIG 45 03/09/2016   CHOLHDL 2.0 03/09/2016    Significant Diagnostic Results in last 30 days:  No results found.  Assessment/Plan   chest pain-unclear etiology and there could be an anxiety component to this as well as GERD-nonetheless after receiving oneNiro she did no longer complain of chest pain vital signs remained stable initially and on reevaluation-at this point will continue  to monitor but I do not really see anything acute here-she appears comfortable and at her baseline on reevaluation.  She continues on Protonix for GERD as well as Ativan for anxiety-she  also is on a statin for hyperlipidemia. Regards hypertension she does continue on hydralazine and Norvasc Lotensin at times his systolic spikes but this appears to come down with her blood pressuremeds administration  Again she has refused aspirin   Certainly ifchest pain reoccurs in short order will be more aggressive and possibly sent to ER although I do not really see a need for this at this point.  [id number] note greater than 40 minutes spent assessing patient-reassessing patient-discussing her status with nursing-reviewing her chart-reviewing her labs-and coordinating a plan of care

## 2017-01-03 DIAGNOSIS — B351 Tinea unguium: Secondary | ICD-10-CM | POA: Diagnosis not present

## 2017-01-03 DIAGNOSIS — M1000 Idiopathic gout, unspecified site: Secondary | ICD-10-CM | POA: Diagnosis not present

## 2017-01-03 DIAGNOSIS — I739 Peripheral vascular disease, unspecified: Secondary | ICD-10-CM | POA: Diagnosis not present

## 2017-01-03 DIAGNOSIS — M6281 Muscle weakness (generalized): Secondary | ICD-10-CM | POA: Diagnosis not present

## 2017-01-06 ENCOUNTER — Non-Acute Institutional Stay (SKILLED_NURSING_FACILITY): Payer: Medicare Other | Admitting: Internal Medicine

## 2017-01-06 DIAGNOSIS — I5032 Chronic diastolic (congestive) heart failure: Secondary | ICD-10-CM | POA: Diagnosis not present

## 2017-01-06 DIAGNOSIS — R05 Cough: Secondary | ICD-10-CM | POA: Diagnosis not present

## 2017-01-06 DIAGNOSIS — R059 Cough, unspecified: Secondary | ICD-10-CM

## 2017-01-06 DIAGNOSIS — I10 Essential (primary) hypertension: Secondary | ICD-10-CM

## 2017-01-08 NOTE — Progress Notes (Signed)
This is an acute visit.  Level care skilled.  Facility is [facility].  Chief complaint acute visit secondary to coughing episode.  History of present illness.  Patient is a pleasant 83 year old female  who apparently had a coughing episode and requested to be seen by me.  Actually when I saw her the coughing had largely subsided and lung exam was fairly benign.  Vital signs were stable 2 saturation was in the 90s on room air.  She does have numerous medical issues including history of diastolic CHF on Lasix-also history of GERD on proton pump inhibitor-as well as allergic rhinitis and continues on Claritin.  She also has nebulizers every 6 hours when necessary  Currently she is sitting in her wheelchair comfortably-nursing also reported possibly some low blood pressure with a systolic in the 40J however I took it manually and got 118/54-she at times has elevated readings but I do not see consistent elevations or hypotensive readings she  is asymptomatic I suspect the size of her arm at times only to somewhat challenging readings if a large cuff is not used.  She does continue on hydralazine 75 mg 3 times a day Lotensin 40 mg a day and Norvasc 5 mg a day  Past Medical History:  Diagnosis Date  . Anxiety   . Cellulitis 2016  . CHF (congestive heart failure) ([place])   . Chronic kidney disease   . Dementia   . Dysphagia   . Gait abnormality   . GERD (gastroesophageal reflux disease)   . Glaucoma   . Gout   . Hyperlipidemia   . Hypertension   . Muscle weakness   . Myocardial infarction 1990s   [place]  . Osteoarthritis   . Syncope         Past Surgical History:  Procedure Laterality Date  . APPENDECTOMY    . CATARACT EXTRACTION W/PHACO  08/04/2011   Procedure: CATARACT EXTRACTION PHACO AND INTRAOCULAR LENS PLACEMENT (IOC); Surgeon: Tonny Branch; Location: [facility]; Service: Ophthalmology; Laterality: Right; CDE=18.53  . CATARACT EXTRACTION  W/PHACO  08/25/2011   Procedure: CATARACT EXTRACTION PHACO AND INTRAOCULAR LENS PLACEMENT (IOC); Surgeon: Tonny Branch; Location: [facility]; Service: Ophthalmology; Laterality: Left; CDE:14.76  . TONSILLECTOMY           Allergies  Allergen Reactions  . Bee Venom Anaphylaxis  . Aspirin     Directed by physician  . Cabbage Other (See Comments)    Certain foods due to gout flares  . Latex Rash         Current Outpatient Prescriptions on File Prior to Visit  Medication Sig Dispense Refill  . acetaminophen (TYLENOL) 500 MG tablet Take 1,000 mg by mouth 2 (two) times daily. For leg pain and arthritis. May have additional 1000 mg for break through pain    . amLODipine (NORVASC) 5 MG tablet Take 5 mg by mouth daily.    . benazepril (LOTENSIN) 40 MG tablet Take 40 mg by mouth daily.    . Brinzolamide-Brimonidine (SIMBRINZA) 1-0.2 % SUSP Apply to eye. One drop into both eyes three times a day    . Calcium Carbonate-Vitamin D (CALCIUM-VITAMIN D) 500-200 MG-UNIT tablet Take 1 tablet by mouth 2 (two) times daily.    . citalopram (CELEXA) 20 MG tablet Take 20 mg by mouth daily.    . cycloSPORINE (RESTASIS) 0.05 % ophthalmic emulsion Place 1 drop into both eyes 2 (two) times daily.    Marland Kitchen donepezil (ARICEPT) 10 MG tablet Take 10 mg by  mouth at bedtime. For dementia    . furosemide (LASIX) 40 MG tablet Give 40 mg a day    . hydrALAZINE (APRESOLINE) 25 MG tablet Take 50 mg by mouth 3 (three) times daily. Along with 25 mg to = 75 mg    . ipratropium-albuterol (DUONEB) 0.5-2.5 (3) MG/3ML SOLN Take 3 mLs by nebulization every 6 (six) hours as needed.    . loratadine (CLARITIN) 10 MG tablet Take 10 mg by mouth daily.    Marland Kitchen LORazepam (ATIVAN) 0.5 MG tablet Take 0.5 tablets (0.25 mg total) by mouth 2 (two) times daily. 30 tablet 5  . lovastatin (MEVACOR) 40 MG tablet Take 40 mg by mouth at bedtime.    . nitroGLYCERIN (NITROSTAT) 0.4 MG SL tablet Place 0.4 mg  under the tongue every 5 (five) minutes as needed for chest pain.     . pantoprazole (PROTONIX) 40 MG tablet Take 40 mg by mouth daily.     Marland Kitchen POTASSIUM PO Take 10 meq by mouth daily    . Travoprost, BAK Free, (TRAVATAN) 0.004 % SOLN ophthalmic solution Place 1 drop into both eyes at bedtime. For glaucoma     No current facility-administered medications on file prior to visit.     Review of Systems  Constitutional: Positive for fatigue. Negative for appetite change, chillsand fever.  HENT: . Negative for sinus painand sinus pressure. A residual changes  Respiratory: . Negative for chest tightness, attimes she will complain of shortness of breath and cough as noted above Cardiovascular: Positive for leg swelling. Negative for chest painand palpitations.  Gastrointestinal: Positive for abdominal distention. Negative for abdominal pain, constipationand diarrhea Complains of gagging at times as noted above.  Genitourinary: Negative for dysuriaand urgency.  Musculoskeletal: Positive for back painand neck pain. Itches intermittent is not really complaining of that tonight s.  Skin: Positive for color change. Negative for pallorand rash. Has history of venous stasis Neurological: Positive for weakness. Negative for dizziness, seizures, speech difficultyand light-headedness.  Psychiatric/Behavioral: Positive for sleep disturbance. Negative for agitation, decreased concentrationand dysphoric mood. The patient is nervous/anxious.        Immunization History  Administered Date(s) Administered  . Influenza Whole 06/11/2007, 10/16/2008, 05/21/2009, 05/05/2010  . Influenza-Unspecified 06/05/2014, 06/02/2016  . Pneumococcal Polysaccharide-23 06/11/2007  . Pneumococcal-Unspecified 06/06/2016  . Td 06/11/2007  . Zoster 05/21/2007       Pertinent Health Maintenance Due  Topic Date Due  . PNA vac Low Risk Adult (2 of 2 - PCV13) 06/06/2017  . INFLUENZA VACCINE   Completed  . DEXA SCAN  Completed    Physical  exam. She is afebrile pulse 60 respirations of 18 blood pressure 118/54 O2 saturation is in the low 90s on room air weight is stable at 188.8  Sh   thisis a pleasant elderly female in no distress.Sitting comfortably in her wheelchair-initially appeared anxious but by and of exam appear to be quite calm and no longer coughing  Her skin is warm and dry.Venous stasis changes lower extremities bilaterally  I do not appreciate any increased erythema or warmth  Eyes pupils appear equal round reactive light visual acuity appears grossly intact..    Oropharynx is clear mucous membranes moist.--  Chest --- no labored breathing.-Largely clear to auscultation with a few scattered coarse breath sounds which is not a new finding  Heart is regular rate and rhythm without murmur gallop or rub -she has chronic venous stasis changes bilaterally lower extremitiesas noted above edema appears to be at baseline  Abdomen is obese soft nontender positive bowel sounds. Baseline exam  Muscle skeletal moves all extremities at baseline is ambulating in a wheelchair-do not note any acute deformities she does have arthritic changes continues have some pain with palpitation of her legs but this is chronic.    Neurologic is grossly intact her speech is clear I don't see any focal weaknesses cranial nerves are grossly intact.  Psych she is oriented to self she is pleasant conversant initially somewhat anxious but as we talked became less anxious which ioften been the case in the past as well   Labs.  12/12/2016.  Sodium 139 potassium 3.9 BUN 38 creatinine 1.38 CO2 level stable at 29.  WBC 5.2 hemoglobin 11.3 platelets 220  11/07/2016.  Sodium 141 potassium 4.1 BUN 43 creatinine 1.3.  09/27/2016.  Liver function tests within normal limits of note albumin was 3.9.  WBC 4.2 hemoglobin 11.3 platelets 193.  July  2017.  Cholesterol was 203-triglycerides 45-HDL 101-LDL of 93   Assessment and plan.  #1 cough this appears to be a transitory episode O2 saturations vital signs are stable she does not show any acuity here-lung exam is essentially baseline Will write an order to monitor vital signs and pulse ox every shift for 48 hours however to keep abreast of this notify provider of any increased cough shortness of breath or elevated temperature. Her weights are stable edema appears to be at baseline continues on Lasix with history of diastolic CHF her BNPs in the passive been quite unremarkable.  She also has duo nebs as needed-and has a routine order for Claritin with her history of allergic rhinitis although I did not see significant allergy symptoms on exam.    #2 hypertension again this appears stable per manual reading as noted above at this point will monitor continue current medications including hydralazine Lotensin and Norvasc.   [id number]

## 2017-01-12 DIAGNOSIS — F411 Generalized anxiety disorder: Secondary | ICD-10-CM | POA: Diagnosis not present

## 2017-01-12 DIAGNOSIS — F0390 Unspecified dementia without behavioral disturbance: Secondary | ICD-10-CM | POA: Diagnosis not present

## 2017-01-12 DIAGNOSIS — F329 Major depressive disorder, single episode, unspecified: Secondary | ICD-10-CM | POA: Diagnosis not present

## 2017-02-01 ENCOUNTER — Non-Acute Institutional Stay (SKILLED_NURSING_FACILITY): Payer: Medicare Other | Admitting: Internal Medicine

## 2017-02-01 DIAGNOSIS — I10 Essential (primary) hypertension: Secondary | ICD-10-CM

## 2017-02-01 DIAGNOSIS — I5032 Chronic diastolic (congestive) heart failure: Secondary | ICD-10-CM

## 2017-02-01 DIAGNOSIS — N183 Chronic kidney disease, stage 3 unspecified: Secondary | ICD-10-CM

## 2017-02-01 NOTE — Progress Notes (Signed)
This is an acute visit.  Level care skilled.  Facility is [facility].  Chief complaint-acute visit follow-up hypertension.  History of present illness  Patient is a pleasant 83 year old female. With a history of dementia hypertension diastolic CHF GERD anxiety and chronic kidney disease.  She has somewhat variable blood pressures at times appears to be fairly significantly high but this is quite variable and often times will be almost borderline low at times.  She is on numerous medications including hydralazine 75 mg 3 times a day-Lotensin 40 mg a day and Norvasc 5 mg a day she is also on Lasix 40 mg a day potassium supplementation.  Apparently her blood pressure earlier today systolically in the [id number] later in the day this came down to 118 and actually when I took her pressure this evening he was in the high 90s proximally 98/50.  She did not complain of any dizziness or syncope-.  She is resting in her wheelchair comfortably she is bright alert and at baseline.  See previous list of blood pressures all over the board ranging from 124/57-168/68-160/60-121/59 I also see 102/60 comparable to what I got this evening.   Past Medical History:  Diagnosis Date  . Anxiety   . Cellulitis 2016  . CHF (congestive heart failure) ([place])   . Chronic kidney disease   . Dementia   . Dysphagia   . Gait abnormality   . GERD (gastroesophageal reflux disease)   . Glaucoma   . Gout   . Hyperlipidemia   . Hypertension   . Muscle weakness   . Myocardial infarction 1990s   [place]  . Osteoarthritis   . Syncope         Past Surgical History:  Procedure Laterality Date  . APPENDECTOMY    . CATARACT EXTRACTION W/PHACO  08/04/2011   Procedure: CATARACT EXTRACTION PHACO AND INTRAOCULAR LENS PLACEMENT (IOC); Surgeon: Tonny Branch; Location: [facility]; Service: Ophthalmology; Laterality: Right; CDE=18.53  . CATARACT EXTRACTION W/PHACO  08/25/2011    Procedure: CATARACT EXTRACTION PHACO AND INTRAOCULAR LENS PLACEMENT (IOC); Surgeon: Tonny Branch; Location: [facility]; Service: Ophthalmology; Laterality: Left; CDE:14.76  . TONSILLECTOMY           Allergies  Allergen Reactions  . Bee Venom Anaphylaxis  . Aspirin     Directed by physician  . Cabbage Other (See Comments)    Certain foods due to gout flares  . Latex Rash         Current Outpatient Prescriptions on File Prior to Visit  Medication Sig Dispense Refill  . acetaminophen (TYLENOL) 500 MG tablet Take 1,000 mg by mouth 2 (two) times daily. For leg pain and arthritis. May have additional 1000 mg for break through pain    . amLODipine (NORVASC) 5 MG tablet Take 5 mg by mouth daily.    . benazepril (LOTENSIN) 40 MG tablet Take 40 mg by mouth daily.    . Brinzolamide-Brimonidine (SIMBRINZA) 1-0.2 % SUSP Apply to eye. One drop into both eyes three times a day    . Calcium Carbonate-Vitamin D (CALCIUM-VITAMIN D) 500-200 MG-UNIT tablet Take 1 tablet by mouth 2 (two) times daily.    . citalopram (CELEXA) 20 MG tablet Take 20 mg by mouth daily.    . cycloSPORINE (RESTASIS) 0.05 % ophthalmic emulsion Place 1 drop into both eyes 2 (two) times daily.    Marland Kitchen donepezil (ARICEPT) 10 MG tablet Take 10 mg by mouth at bedtime. For dementia    . furosemide (LASIX) 40 MG  tablet Give 40 mg a day    . hydrALAZINE (APRESOLINE) 25 MG tablet Take 50 mg by mouth 3 (three) times daily. Along with 25 mg to = 75 mg    . ipratropium-albuterol (DUONEB) 0.5-2.5 (3) MG/3ML SOLN Take 3 mLs by nebulization every 6 (six) hours as needed.    . loratadine (CLARITIN) 10 MG tablet Take 10 mg by mouth daily.    Marland Kitchen LORazepam (ATIVAN) 0.5 MG tablet Take 0.5 tablets (0.25 mg total) by mouth 2 (two) times daily. 30 tablet 5  . lovastatin (MEVACOR) 40 MG tablet Take 40 mg by mouth at bedtime.    . nitroGLYCERIN (NITROSTAT) 0.4 MG SL tablet Place 0.4 mg under the tongue every 5  (five) minutes as needed for chest pain.     . pantoprazole (PROTONIX) 40 MG tablet Take 40 mg by mouth daily.     Marland Kitchen POTASSIUM PO Take 10 meq by mouth daily    . Travoprost, BAK Free, (TRAVATAN) 0.004 % SOLN ophthalmic solution Place 1 drop into both eyes at bedtime. For glaucoma     No current facility-administered medications on file prior to visit.     Review of Systems  Constitutional: . Negative for appetite change, chillsand fever.  HENT: . Negative for sinus painand sinus pressure. A residual changes  Respiratory: . Negative for chest tightness, shortness of breathand wheezing.  Cardiovascular: Positive for leg swelling. Negative for chest painand palpitations.  Gastrointestinal: Positive for abdominal distention. Negative for abdominal pain, constipationand diarrhea Complains of gagging at times as noted above.  Genitourinary: Negative for dysuriaand urgency.  Musculoskeletal: At times complains of back pain in various muscle pains was not complaining of that this evening Skin: Positive for color change. Negative for pallorand rash. Has history of venous stasis Neurological: Positive for weakness. Negative for dizziness, seizures, speech difficultyand light-headedness.  Psychiatric/Behavioral: Positive for sleep disturbance. Negative for agitation, decreased concentrationand dysphoric mood. The patient is nervous/anxious.        Immunization History  Administered Date(s) Administered  . Influenza Whole 06/11/2007, 10/16/2008, 05/21/2009, 05/05/2010  . Influenza-Unspecified 06/05/2014, 06/02/2016  . Pneumococcal Polysaccharide-23 06/11/2007  . Pneumococcal-Unspecified 06/06/2016  . Td 06/11/2007  . Zoster 05/21/2007       Pertinent Health Maintenance Due  Topic Date Due  . PNA vac Low Risk Adult (2 of 2 - PCV13) 06/06/2017  . INFLUENZA VACCINE  Completed  . DEXA SCAN  Completed    Physical  exam. She is afebrile pulses 72  respirations of 21 blood pressure taken manually 98/50-previous blood pressures as noted above    GEN- thisis a pleasant elderly female in no distress.Sitting comfortably in her wheelchair-initially appeared anxious but by and of exam appear to be quite calm talking about supper  Her skin is warm and dry.Venous stasis changes lower extremities bilaterally  I do not appreciate any increased erythema or warmth  Eyes pupils appear equal round reactive light visual acuity appears grossly intact..    Oropharynx is clear mucous membranes moist.--  Chest --- no labored breathing.-Clear to auscultation at times will have a few coarse breath sounds but I don't hear this evening  Heart is regular rate and rhythm without murmur gallop or rub -she has chronic venous stasis changes bilaterally lower extremitiesas noted above edema appears to be at baseline possibly slightly improved  Abdomen is obese soft nontender positive bowel sounds. Baseline exam  Muscle skeletal moves all extremities at baseline is ambulating in a wheelchair-do not note any acute deformities  she does have arthritic changes continues have some pain with palpitation of her legs but this is not new.    Neurologic is grossly intact her speech is clear I don't see any focal weaknesses cranial nerves are grossly intact.  Psych she is oriented to self she is pleasant conversant  at her baseline  Labs.  12/22/2016.  Sodium 139 potassium 3.9 BUN 38 creatinine 1.38.  WBC 5.2 hemoglobin 11.3 platelets 227  11/07/2016.  Sodium 141 potassium 4.1 BUN 43 creatinine 1.3.  09/27/2016.  Liver function tests within normal limits of note albumin was 3.9.  WBC 4.2 hemoglobin 11.3 platelets 193.  July 2017.  Cholesterol was 203-triglycerides 45-HDL 101-LDL of 93  Assessment and plan.  #1-hypertension-again she has quite variable blood pressures-does not appear her blood pressures are  consistently high or low however at this point will continue current medications secondary to her clinical stability-it appears after she receives all her medicines her blood pressure has occasional systolics in the higher 88L but she is asymptomatic and secondary to the higher readings would be hesitant t to reduce her medications since she has been clinically stable.  #23 chronic kidney disease last creatinine of 1.38 appears relatively baseline Will update this for updated values.  She continues on Lasix with a history of diastolic CHF appears to be tolerating this well actually appears to have lost some weight and edema.  [id number]

## 2017-02-02 ENCOUNTER — Non-Acute Institutional Stay (SKILLED_NURSING_FACILITY): Payer: Medicare Other | Admitting: Internal Medicine

## 2017-02-02 ENCOUNTER — Encounter: Payer: Self-pay | Admitting: Internal Medicine

## 2017-02-02 DIAGNOSIS — F411 Generalized anxiety disorder: Secondary | ICD-10-CM | POA: Diagnosis not present

## 2017-02-02 DIAGNOSIS — I10 Essential (primary) hypertension: Secondary | ICD-10-CM

## 2017-02-02 NOTE — Progress Notes (Signed)
Location:   [facility] Room Number: 122/D Place of Service:  SNF [phone]) Provider:  Mickel Duhamel, PA-C  Patient Care Team: [name] as PCP - General (Internal Medicine) [name], MD as Consulting Physician (Geriatric Medicine)  Extended Emergency Contact Information Primary Emergency Contact: [name], [place] [address] [name] of [place] Phone: [phone] Relation: Daughter Secondary Emergency Contact: [name] [place] of [place] Phone: [phone] Relation: Son  Code Status:  DNR Goals of care: Advanced Directive information Advanced Directives 02/02/2017  Does Patient Have a Medical Advance Directive? Yes  Type of Advance Directive Out of facility DNR (pink MOST or yellow form)  Does patient want to make changes to medical advance directive? No - Patient declined  Copy of [place] in Chart? -  Pre-existing out of facility DNR order (yellow form or pink MOST form) -     Chief Complaint  Patient presents with  . Acute Visit    Anxiety  Follow-up hypertension  HPI:  Pt is a 83 y.o. female seen today for an acute visit for for follow-up of anxiety as well as hypertension.  She was actually seen last night for follow-up of hypertension apparently her systolic was around 401 early yesterday but with medication administration gradually came down and actually was systolically in the high 02V when I checked her last night.  She continues to be stable in this regard with variable blood pressures or blood pressure today is 143/77 which is not unusual.  Again it tends to come down with her medications as noted yesterday.  She is on numerous medications including Lotensin 40 mg a day-hydralazine 75 mg 3 times a day-also is on Lasix 40 mg a day  Nursing today states that patient has at times increased anxiety and she says this as well  She doesn't really give  any specific reason for the increased anxiety but she does have times her she comes somewhat shaky and nervous according in nursing staff-current dose of 0.25 mg twice a day  is not totally effective according to nursing and patient-they would like it increased slightly Nursing feels it would help make her calmer and feel better more consistently.  And patient states agreement with this   Past Medical History:  Diagnosis Date  . Anxiety   . Cellulitis 2016  . CHF (congestive heart failure) ([place])   . Chronic kidney disease   . Dementia   . Dysphagia   . Gait abnormality   . GERD (gastroesophageal reflux disease)   . Glaucoma   . Gout   . Hyperlipidemia   . Hypertension   . Muscle weakness   . Myocardial infarction [facility]) [place]  . Osteoarthritis   . Syncope    Past Surgical History:  Procedure Laterality Date  . APPENDECTOMY    . CATARACT EXTRACTION W/PHACO  08/04/2011   Procedure: CATARACT EXTRACTION PHACO AND INTRAOCULAR LENS PLACEMENT (IOC);  Surgeon: Tonny Branch;  Location: [facility];  Service: Ophthalmology;  Laterality: Right;  CDE=18.53  . CATARACT EXTRACTION W/PHACO  08/25/2011   Procedure: CATARACT EXTRACTION PHACO AND INTRAOCULAR LENS PLACEMENT (IOC);  Surgeon: Tonny Branch;  Location: [facility];  Service: Ophthalmology;  Laterality: Left;  CDE:14.76  . TONSILLECTOMY      Allergies  Allergen Reactions  . Bee Venom Anaphylaxis  . Aspirin     Directed by  physician  . Cabbage Other (See Comments)    Certain foods due to gout flares  . Latex Rash    Outpatient Encounter Prescriptions as of 02/02/2017  Medication Sig  . acetaminophen (TYLENOL) 500 MG tablet Take 1,000 mg by mouth 2 (two) times daily. For leg pain and arthritis. May have additional 1000 mg for break through pain  . amLODipine (NORVASC) 5 MG tablet Take 5 mg by mouth daily.  . benazepril (LOTENSIN) 40 MG tablet Take 40 mg by mouth daily.  . Brinzolamide-Brimonidine (SIMBRINZA) 1-0.2 % SUSP Apply to  eye. One  drop into both eyes three times a day  . Calcium Carbonate-Vitamin D (CALCIUM-VITAMIN D) 500-200 MG-UNIT tablet Take 1 tablet by mouth 2 (two) times daily.  . citalopram (CELEXA) 20 MG tablet Take 20 mg by mouth daily.  . cycloSPORINE (RESTASIS) 0.05 % ophthalmic emulsion Place 1 drop into both eyes 2 (two) times daily.  Marland Kitchen donepezil (ARICEPT) 10 MG tablet Take 10 mg by mouth at bedtime. For dementia  . furosemide (LASIX) 40 MG tablet Give 40 mg only on day 3 and there after  . hydrALAZINE (APRESOLINE) 25 MG tablet Take 25 g by mouth along with 50 mg to equal 75 mg three times day  . hydrALAZINE (APRESOLINE) 50 MG tablet Take 50 mg by mouth along with 25 mg to equal 75 mg three times a day  . loratadine (CLARITIN) 10 MG tablet Take 10 mg by mouth daily.  Marland Kitchen LORazepam (ATIVAN) 0.5 MG tablet Take 0.5 tablets (0.25 mg total) by mouth 2 (two) times daily.  Marland Kitchen lovastatin (MEVACOR) 40 MG tablet Take 40 mg by mouth at bedtime.  . nitroGLYCERIN (NITROSTAT) 0.4 MG SL tablet Place 0.4 mg under the tongue every 5 (five) minutes as needed for chest pain.   . pantoprazole (PROTONIX) 40 MG tablet Take 40 mg by mouth daily.   Marland Kitchen POTASSIUM PO Take 10 meq by mouth daily  . Travoprost, BAK Free, (TRAVATAN) 0.004 % SOLN ophthalmic solution Place 1 drop into both eyes at bedtime. For glaucoma  . [DISCONTINUED] ipratropium-albuterol (DUONEB) 0.5-2.5 (3) MG/3ML SOLN Take 3 mLs by nebulization every 6 (six) hours as needed.   No facility-administered encounter medications on file as of 02/02/2017.     Review of Systems   Gen. she not complaining fever chills but does state that she does have some increased anxiety at times and.  Skin is not S searching.  Head ears eyes nose mouth throat no complaints of visual changes sore throat.  Respiratory is not complaining of shortness breath or cough today.  Cardiac continues to deny chest pain has chronic lower extremity edema.  GI is not complaining of nausea  vomiting diarrhea constipation or abdominal discomfort.  Muscle skeletal is not really complaining of joint pain today does have times complain of this.  Neurologic is not complaining of headache dizziness at this time.  In psych again does have a history of depression and is on Celexa as well as anxiety per patient and staff Ativan at current dosing is not totally effective throughout the day  Immunization History  Administered Date(s) Administered  . Influenza Whole 06/11/2007, 10/16/2008, 05/21/2009, 05/05/2010  . Influenza-Unspecified 06/05/2014, 06/02/2016  . Pneumococcal Polysaccharide-23 06/11/2007  . Pneumococcal-Unspecified 06/06/2016  . Td 06/11/2007  . Zoster 05/21/2007   Pertinent  Health Maintenance Due  Topic Date Due  . INFLUENZA VACCINE  03/29/2017  . PNA vac Low Risk Adult (2 of 2 - PCV13) 06/06/2017  .  DEXA SCAN  Completed   No flowsheet data found. Functional Status Survey:    Vitals:   02/02/17 1426  BP: (!) 143/77  Pulse: 65    Physical Exam   thisis a pleasant elderly female in no distress.Sitting comfortably in her wheelchair-  Her skin is warm and dry.Venous stasis changes lower extremities bilaterally  I do not appreciate any increased erythema or warmth   .Marland Kitchen   Chest --- no labored breathing.-Clear to auscultation at times will have a few coarse breath sounds but I don't hear this afternoon  Heart is regular rate and rhythm without murmur gallop or rub -she has chronic venous stasis changes bilaterally lower extremitiesas noted aboveedema appears to be at baseline possibly slightly improved  over the course of the past few weeks  Abdomen is obese soft nontender positive bowel sounds.Baseline exam  Muscle skeletal moves all extremities at baseline is ambulating in a wheelchair-do not note any acute deformities she does have arthritic changes continues have some pain with palpitation of her legs but this is not  new.    Neurologic is grossly intact her speech is clear I don't see any focal weaknesses cranial nerves are grossly intact.  Psych she is oriented to self she is pleasant conversant at her baseline but is quite adamant that she thinks she needs a higher dose of Ativan  nursing agrees with this--   Labs reviewed:  Recent Labs  09/27/16 0715 11/07/16 0700 12/12/16 0800  NA 138 141 139  K 3.8 4.1 3.9  CL 102 105 103  CO2 26 28 29   GLUCOSE 116* 97 91  BUN 29* 43* 38*  CREATININE 1.39* 1.30* 1.38*  CALCIUM 9.2 9.3 9.3    Recent Labs  02/11/16 0740 09/27/16 0715  AST 15 20  ALT 13* 15  ALKPHOS 98 57  BILITOT 0.6 0.5  PROT 7.2 7.1  ALBUMIN 3.8 3.9    Recent Labs  05/30/16 0600 09/27/16 0715 12/12/16 0800  WBC 6.0 4.2 5.2  NEUTROABS 3.2 2.6 2.9  HGB 12.0 11.3* 11.3*  HCT 36.9 34.9* 34.8*  MCV 85.8 86.6 85.5  PLT 210 193 227   Lab Results  Component Value Date   TSH 1.454 04/10/2015   Lab Results  Component Value Date   HGBA1C 6.0 (H) 03/09/2016   Lab Results  Component Value Date   CHOL 203 (H) 03/09/2016   HDL 101 03/09/2016   LDLCALC 93 03/09/2016   TRIG 45 03/09/2016   CHOLHDL 2.0 03/09/2016    Significant Diagnostic Results in last 30 days:  No results found.  Assessment/Plan T  1 hypertension-as noted per discussion above this appears relatively stable we'll continue current medications she has quite a bit of variability here but clinically is stable.  #2-anxiety this is been a persistent issue apparently she does have fairly persistent anxiety through the day which affects her quality of life will increase her Ativan up to 0.5 mg twice a day routine continue to hold for any sedation or respiratory depression.  [id number]

## 2017-02-06 ENCOUNTER — Encounter (HOSPITAL_COMMUNITY)
Admission: RE | Admit: 2017-02-06 | Discharge: 2017-02-06 | Disposition: A | Discharge status: Home / Self Care | Payer: Medicare Other | Source: Skilled Nursing Facility | Attending: Internal Medicine | Admitting: Internal Medicine

## 2017-02-06 DIAGNOSIS — R609 Edema, unspecified: Secondary | ICD-10-CM | POA: Insufficient documentation

## 2017-02-06 DIAGNOSIS — F339 Major depressive disorder, recurrent, unspecified: Secondary | ICD-10-CM | POA: Diagnosis not present

## 2017-02-06 DIAGNOSIS — F419 Anxiety disorder, unspecified: Secondary | ICD-10-CM | POA: Diagnosis not present

## 2017-02-06 DIAGNOSIS — K210 Gastro-esophageal reflux disease with esophagitis: Secondary | ICD-10-CM | POA: Diagnosis not present

## 2017-02-06 DIAGNOSIS — I509 Heart failure, unspecified: Secondary | ICD-10-CM | POA: Diagnosis not present

## 2017-02-06 LAB — BASIC METABOLIC PANEL
Anion gap: 9 (ref 5–15)
BUN: 52 mg/dL — ABNORMAL HIGH (ref 6–20)
CALCIUM: 9.2 mg/dL (ref 8.9–10.3)
CHLORIDE: 103 mmol/L (ref 101–111)
CO2: 27 mmol/L (ref 22–32)
Creatinine, Ser: 1.41 mg/dL — ABNORMAL HIGH (ref 0.44–1.00)
GFR calc non Af Amer: 31 mL/min — ABNORMAL LOW (ref >60)
GFR, EST AFRICAN AMERICAN: 36 mL/min — AB (ref >60)
GLUCOSE: 92 mg/dL (ref 65–99)
POTASSIUM: 4.1 mmol/L (ref 3.5–5.1)
Sodium: 139 mmol/L (ref 135–145)

## 2017-02-07 ENCOUNTER — Encounter: Payer: Self-pay | Admitting: Internal Medicine

## 2017-02-07 ENCOUNTER — Non-Acute Institutional Stay (SKILLED_NURSING_FACILITY): Payer: Medicare Other | Admitting: Internal Medicine

## 2017-02-07 DIAGNOSIS — I10 Essential (primary) hypertension: Secondary | ICD-10-CM | POA: Diagnosis not present

## 2017-02-07 DIAGNOSIS — N289 Disorder of kidney and ureter, unspecified: Secondary | ICD-10-CM | POA: Diagnosis not present

## 2017-02-07 NOTE — Progress Notes (Signed)
Location:   [facility] Room Number: 122/D Place of Service:  SNF [phone]) Provider:  Mickel Duhamel, PA-C  Patient Care Team: [name] as PCP - General (Internal Medicine) [name], MD as Consulting Physician (Geriatric Medicine)  Extended Emergency Contact Information Primary Emergency Contact: [name], [place] [address] [name] of [place] Phone: [phone] Relation: Daughter Secondary Emergency Contact: [name] [place] of [place] Phone: [phone] Relation: Son  Code Status:  DNR Goals of care: Advanced Directive information Advanced Directives 02/07/2017  Does Patient Have a Medical Advance Directive? Yes  Type of Advance Directive Out of facility DNR (pink MOST or yellow form)  Does patient want to make changes to medical advance directive? No - Patient declined  Copy of [place] in Chart? -  Pre-existing out of facility DNR order (yellow form or pink MOST form) -     Chief Complaint  Patient presents with  . Acute Visit    Renal Insufficiency    HPI:  Pt is a 83 y.o. female seen today for an acute visit for Some mildly progressing renal insufficiency with a BUN of 52 and creatinine 1.41 she does have baseline chronic kidney disease creatinine is relatively baseline BUN has gone up somewhat to 52 has usually been in the low 40s.  She is on Lasix 40 mg a day-with a history of diastolic CHF grade 1-her BNPs have been unremarkable-she is actually lost it appears about 15 pounds since April which she desires.  Vital signs are stable she has somewhat variable blood pressures sometimes running somewhat high and then borderline low at other times manual blood pressure today was 128/54-see previous readings 138/81-150/64.  Currently she is sitting in her wheelchair comfortably appears to be good spirits does not really have any complaints  today     Past Medical History:  Diagnosis Date  . Anxiety   . Cellulitis 2016  . CHF (congestive heart failure) ([place])   . Chronic kidney disease   . Dementia   . Dysphagia   . Gait abnormality   . GERD (gastroesophageal reflux disease)   . Glaucoma   . Gout   . Hyperlipidemia   . Hypertension   . Muscle weakness   . Myocardial infarction [facility]) [place]  . Osteoarthritis   . Syncope    Past Surgical History:  Procedure Laterality Date  . APPENDECTOMY    . CATARACT EXTRACTION W/PHACO  08/04/2011   Procedure: CATARACT EXTRACTION PHACO AND INTRAOCULAR LENS PLACEMENT (IOC);  Surgeon: Tonny Branch;  Location: [facility];  Service: Ophthalmology;  Laterality: Right;  CDE=18.53  . CATARACT EXTRACTION W/PHACO  08/25/2011   Procedure: CATARACT EXTRACTION PHACO AND INTRAOCULAR LENS PLACEMENT (IOC);  Surgeon: Tonny Branch;  Location: [facility];  Service: Ophthalmology;  Laterality: Left;  CDE:14.76  . TONSILLECTOMY      Allergies  Allergen Reactions  . Bee Venom Anaphylaxis  . Aspirin     Directed by physician  . Cabbage Other (See Comments)    Certain foods due to gout flares  . Latex Rash    Outpatient Encounter Prescriptions as of 02/07/2017  Medication Sig  . acetaminophen (TYLENOL) 500 MG tablet Take 1,000 mg by mouth 2 (two) times daily. For leg pain and arthritis. May have additional 1000 mg for break through pain  . amLODipine (NORVASC) 5 MG tablet Take 5  mg by mouth daily.  . benazepril (LOTENSIN) 40 MG tablet Take 40 mg by mouth daily.  . Brinzolamide-Brimonidine (SIMBRINZA) 1-0.2 % SUSP Apply to eye. One  drop into both eyes three times a day  . Calcium Carbonate-Vitamin D (CALCIUM-VITAMIN D) 500-200 MG-UNIT tablet Take 1 tablet by mouth 2 (two) times daily.  . citalopram (CELEXA) 20 MG tablet Take 20 mg by mouth daily.  . cycloSPORINE (RESTASIS) 0.05 % ophthalmic emulsion Place 1 drop into both eyes 2 (two) times daily.  Marland Kitchen donepezil (ARICEPT) 10 MG tablet Take 10 mg  by mouth at bedtime. For dementia  . furosemide (LASIX) 40 MG tablet Give 40 mg only on day 3 and there after  . hydrALAZINE (APRESOLINE) 25 MG tablet Take 25 g by mouth along with 50 mg to equal 75 mg three times day  . hydrALAZINE (APRESOLINE) 50 MG tablet Take 50 mg by mouth along with 25 mg to equal 75 mg three times a day  . loratadine (CLARITIN) 10 MG tablet Take 10 mg by mouth daily.  Marland Kitchen LORazepam (ATIVAN) 0.5 MG tablet Take 0.5 tablets (0.25 mg total) by mouth 2 (two) times daily.  Marland Kitchen lovastatin (MEVACOR) 40 MG tablet Take 40 mg by mouth at bedtime.  . nitroGLYCERIN (NITROSTAT) 0.4 MG SL tablet Place 0.4 mg under the tongue every 5 (five) minutes as needed for chest pain.   . pantoprazole (PROTONIX) 40 MG tablet Take 40 mg by mouth daily.   Marland Kitchen POTASSIUM PO Take 10 meq by mouth daily  . Travoprost, BAK Free, (TRAVATAN) 0.004 % SOLN ophthalmic solution Place 1 drop into both eyes at bedtime. For glaucoma   No facility-administered encounter medications on file as of 02/07/2017.     Review of Systems    Gen. she not complaining fever chills has lost weight but says she is eating "better" and desires weight loss  Skin is not complaining of any rashes or itching  Head ears eyes nose mouth throat no complaints of visual changes sore throat.  Respiratory is not complaining of shortness breath or cough today.  Cardiac continues to deny chest pain has chronic lower extremity edema which appears to be slightly improved.  GI is not complaining of nausea vomiting diarrhea constipation or abdominal discomfort.  Muscle skeletal is not really complaining of joint pain today does have times complain of this.  Neurologic is not complaining of headache dizziness at this time.  psych again does have a history of depression and is on Celexa as well as Ativan for anxiety at this point appears stableAtivan  dose was recently increased   Immunization History  Administered Date(s)  Administered  . Influenza Whole 06/11/2007, 10/16/2008, 05/21/2009, 05/05/2010  . Influenza-Unspecified 06/05/2014, 06/02/2016  . Pneumococcal Polysaccharide-23 06/11/2007  . Pneumococcal-Unspecified 06/06/2016  . Td 06/11/2007  . Zoster 05/21/2007   Pertinent  Health Maintenance Due  Topic Date Due  . INFLUENZA VACCINE  03/29/2017  . PNA vac Low Risk Adult (2 of 2 - PCV13) 06/06/2017  . DEXA SCAN  Completed   No flowsheet data found. Functional Status Survey:    Vitals:   02/07/17 1210  BP: (!) 130/54  Pulse: 60  Resp: 16  Temp: 98.5 F (36.9 C)  TempSrc: Oral  Weight: 181 lb 6.4 oz (82.3 kg)  Height: 4\' 11"  (1.499 m)   Body mass index is 36.64 kg/m. Physical Exam thisis a pleasant elderly female in no distress.Sitting comfortably in her wheelchair-  Her skin is warm and dry.Venous  stasis changes lower extremities bilaterally  I do not appreciate any increased erythema or warmth   .Marland Kitchen   Chest --- no labored breathing.-Clear to auscultation . Minimal expiratory wheezing which is not new  Heart is regular rate and rhythm without murmur gallop or rub -she has chronic venous stasis changes bilaterally lower extremitiesas noted aboveedema appears to  improved  over the course of the past few weeks  Abdomen is obese soft nontender positive bowel sounds.Baseline exam  Muscle skeletal moves all extremities at baseline is ambulating in a wheelchair-do not note any acute deformities she does have arthritic changes continues have some pain with palpitation of her legs but this is not new.    Neurologic is grossly intact her speech is clear I don't see any focal weaknesses cranial nerves are grossly intact.  Psych she is oriented to self she is pleasant conversant-   Labs reviewed:  Recent Labs  11/07/16 0700 12/12/16 0800 02/06/17 0800  NA 141 139 139  K 4.1 3.9 4.1  CL 105 103 103  CO2 28 29 27   GLUCOSE 97 91 92  BUN 43* 38* 52*   CREATININE 1.30* 1.38* 1.41*  CALCIUM 9.3 9.3 9.2    Recent Labs  02/11/16 0740 09/27/16 0715  AST 15 20  ALT 13* 15  ALKPHOS 98 57  BILITOT 0.6 0.5  PROT 7.2 7.1  ALBUMIN 3.8 3.9    Recent Labs  05/30/16 0600 09/27/16 0715 12/12/16 0800  WBC 6.0 4.2 5.2  NEUTROABS 3.2 2.6 2.9  HGB 12.0 11.3* 11.3*  HCT 36.9 34.9* 34.8*  MCV 85.8 86.6 85.5  PLT 210 193 227   Lab Results  Component Value Date   TSH 1.454 04/10/2015   Lab Results  Component Value Date   HGBA1C 6.0 (H) 03/09/2016   Lab Results  Component Value Date   CHOL 203 (H) 03/09/2016   HDL 101 03/09/2016   LDLCALC 93 03/09/2016   TRIG 45 03/09/2016   CHOLHDL 2.0 03/09/2016    Significant Diagnostic Results in last 30 days:  No results found.  Assessment/Plan  #1 mildly increased renal insufficiency with a BUN of 52 creatinine 1.41-this was discussed with Dr. Lyndel Safe since her edema appears to be somewhat improved she has lost weight will increase her Lasix down to 20 mg a day I note she is also on potassium 10 mEq a day will recheck this in 1 week to ensure stability of renal function electrolytes-also continue monitor weights and clinical status but she appears to be quite stable in this regard-again the weight loss at this point appears.  #2 hypertension again this has been relatively stable with variability blood pressure today was 128/54 which is satisfactory-she continues on previous medications including--Norvasc 5 mg a day-hydralazine 75 mg 3 times a day-Lotensin 40 mg a day-she is now on Lasix 20 mg a day.  Will monitor for now.  [id number]

## 2017-02-13 ENCOUNTER — Non-Acute Institutional Stay (SKILLED_NURSING_FACILITY): Payer: Medicare Other | Admitting: Internal Medicine

## 2017-02-13 ENCOUNTER — Encounter (HOSPITAL_COMMUNITY)
Admission: RE | Admit: 2017-02-13 | Discharge: 2017-02-13 | Disposition: A | Discharge status: Home / Self Care | Payer: Medicare Other | Source: Skilled Nursing Facility | Attending: *Deleted | Admitting: *Deleted

## 2017-02-13 DIAGNOSIS — F339 Major depressive disorder, recurrent, unspecified: Secondary | ICD-10-CM | POA: Diagnosis not present

## 2017-02-13 DIAGNOSIS — F419 Anxiety disorder, unspecified: Secondary | ICD-10-CM | POA: Diagnosis not present

## 2017-02-13 DIAGNOSIS — N183 Chronic kidney disease, stage 3 unspecified: Secondary | ICD-10-CM

## 2017-02-13 DIAGNOSIS — I10 Essential (primary) hypertension: Secondary | ICD-10-CM | POA: Diagnosis not present

## 2017-02-13 DIAGNOSIS — R609 Edema, unspecified: Secondary | ICD-10-CM | POA: Diagnosis not present

## 2017-02-13 DIAGNOSIS — I509 Heart failure, unspecified: Secondary | ICD-10-CM | POA: Diagnosis not present

## 2017-02-13 DIAGNOSIS — K210 Gastro-esophageal reflux disease with esophagitis: Secondary | ICD-10-CM | POA: Diagnosis not present

## 2017-02-13 LAB — BASIC METABOLIC PANEL
Anion gap: 8 (ref 5–15)
BUN: 57 mg/dL — ABNORMAL HIGH (ref 6–20)
CHLORIDE: 106 mmol/L (ref 101–111)
CO2: 27 mmol/L (ref 22–32)
Calcium: 9.3 mg/dL (ref 8.9–10.3)
Creatinine, Ser: 1.63 mg/dL — ABNORMAL HIGH (ref 0.44–1.00)
GFR calc Af Amer: 30 mL/min — ABNORMAL LOW (ref >60)
GFR, EST NON AFRICAN AMERICAN: 26 mL/min — AB (ref >60)
Glucose, Bld: 91 mg/dL (ref 65–99)
POTASSIUM: 4.5 mmol/L (ref 3.5–5.1)
Sodium: 141 mmol/L (ref 135–145)

## 2017-02-13 NOTE — Progress Notes (Signed)
This is an acute visit.  Level of care is skilled  Facility is [facility].  Chief complaint-acute visit follow-up renal insufficiency.  History of present illness.  Patient is a pleasant 83 year old female seen today for follow-up renal sufficiency-last week we did reduce her Lasix from 40 mg down to 20 mg a day secondary to an elevated BUN of 52 and creatinine of 1.41 which is somewhat above her recent baseline.  She does have a history of baseline chronic kidney disease-with the BUNs most recently in the 72s.  She does have a history of diastolic CHF grade 1 along with venous stasis changes.  Her edema actually appears to have improved somewhat recently and her weight has gone down fairly significantly has lost about 15 pounds since April-.  She says she feels better and she does want to weight loss.  Updated lab today shows creatinine continues to rise somewhat at 1.63 BUN is 57 which is slightly up as well.  She does not report any increased shortness of breath chest pain appears to be in good spirits.  She also has a history of hypertension which has had variable blood pressures she is on numerous agents including hydralazine 25 mg 3 times a day Lotensin 40 mg a day and Norvasc 5 mg a day-at times she'll have somewhat elevated readings in the 824M systolically and at times will actually have systolics a bit below 353 I got 98/40 tonight -- she is asymptomatic does not complain of any syncope or lightheadedness or increased weakness.  Past Medical History:  Diagnosis Date  . Anxiety   . Cellulitis 2016  . CHF (congestive heart failure) ([place])   . Chronic kidney disease   . Dementia   . Dysphagia   . Gait abnormality   . GERD (gastroesophageal reflux disease)   . Glaucoma   . Gout   . Hyperlipidemia   . Hypertension   . Muscle weakness   . Myocardial infarction [facility] [place]  . Osteoarthritis   . Syncope         Past Surgical History:    Procedure Laterality Date  . APPENDECTOMY    . CATARACT EXTRACTION W/PHACO  08/04/2011   Procedure: CATARACT EXTRACTION PHACO AND INTRAOCULAR LENS PLACEMENT (IOC);  Surgeon: Tonny Branch;  Location: [facility];  Service: Ophthalmology;  Laterality: Right;  CDE=18.53  . CATARACT EXTRACTION W/PHACO  08/25/2011   Procedure: CATARACT EXTRACTION PHACO AND INTRAOCULAR LENS PLACEMENT (IOC);  Surgeon: Tonny Branch;  Location: [facility];  Service: Ophthalmology;  Laterality: Left;  CDE:14.76  . TONSILLECTOMY           Allergies  Allergen Reactions  . Bee Venom Anaphylaxis  . Aspirin     Directed by physician  . Cabbage Other (See Comments)    Certain foods due to gout flares  . Latex Rash        Outpatient Encounter Prescriptions as of 02/07/2017  Medication Sig  . acetaminophen (TYLENOL) 500 MG tablet Take 1,000 mg by mouth 2 (two) times daily. For leg pain and arthritis. May have additional 1000 mg for break through pain  . amLODipine (NORVASC) 5 MG tablet Take 5 mg by mouth daily.  . benazepril (LOTENSIN) 40 MG tablet Take 40 mg by mouth daily.  . Brinzolamide-Brimonidine (SIMBRINZA) 1-0.2 % SUSP Apply to eye. One  drop into both eyes three times a day  . Calcium Carbonate-Vitamin D (CALCIUM-VITAMIN D) 500-200 MG-UNIT tablet Take 1 tablet by mouth 2 (  two) times daily.  . citalopram (CELEXA) 20 MG tablet Take 20 mg by mouth daily.  . cycloSPORINE (RESTASIS) 0.05 % ophthalmic emulsion Place 1 drop into both eyes 2 (two) times daily.  Marland Kitchen donepezil (ARICEPT) 10 MG tablet Take 10 mg by mouth at bedtime. For dementia  . Lasix 20 mg po QD    . hydrALAZINE (APRESOLINE) 25 MG tablet Take 25 g by mouth along with 50 mg to equal 75 mg three times day  . hydrALAZINE (APRESOLINE) 50 MG tablet Take 50 mg by mouth along with 25 mg to equal 75 mg three times a day  . loratadine (CLARITIN) 10 MG tablet Take 10 mg by mouth daily.  Marland Kitchen LORazepam (ATIVAN) 0.5 MG tablet Take 0.5 tablets (0.25 mg total)  by mouth 2 (two) times daily.  Marland Kitchen lovastatin (MEVACOR) 40 MG tablet Take 40 mg by mouth at bedtime.  . nitroGLYCERIN (NITROSTAT) 0.4 MG SL tablet Place 0.4 mg under the tongue every 5 (five) minutes as needed for chest pain.   . pantoprazole (PROTONIX) 40 MG tablet Take 40 mg by mouth daily.   Marland Kitchen POTASSIUM PO Take 10 meq by mouth daily  . Travoprost, BAK Free, (TRAVATAN) 0.004 % SOLN ophthalmic solution Place 1 drop into both eyes at bedtime. For glaucoma   No facility-administered encounter medications on file as of 02/07/2017.     Review of Systems    Gen. she not complaining fever chills has lost weight but says she is eating "better" and desires weight loss  Skin is not complaining of any rashes or itching  Head ears eyes nose mouth throat no complaints of visual changes sore throat.  Respiratory is not complaining of shortness breath or cough   Cardiac continues to deny chest pain has chronic lower extremity edema which appears to be somewhat  improved.  GI is not complaining of nausea vomiting diarrhea constipation or abdominal discomfort.  Muscle skeletal is not really complaining of joint pain today does have times complain of this.  Neurologic is not complaining of headache dizziness at this time.   psych again does have a history of depression and is on Celexa as well as Ativan for anxiety at this point appears stable       Immunization History  Administered Date(s) Administered  . Influenza Whole 06/11/2007, 10/16/2008, 05/21/2009, 05/05/2010  . Influenza-Unspecified 06/05/2014, 06/02/2016  . Pneumococcal Polysaccharide-23 06/11/2007  . Pneumococcal-Unspecified 06/06/2016  . Td 06/11/2007  . Zoster 05/21/2007       Pertinent  Health Maintenance Due  Topic Date Due  . INFLUENZA VACCINE  03/29/2017  . PNA vac Low Risk Adult (2 of 2 - PCV13) 06/06/2017  . DEXA SCAN  Completed   No flowsheet data found. Functional Status Survey:                                  She is afebrile pulse is 68 respirations of 18 blood pressure taken manually 98/40 previous blood pressures 101/48-157/64 weight is 180 this is lost about 15 pounds over the past 2 months but stable with last week's weight  Physical Exam thisis a pleasant elderly female in no distress.Sitting comfortably in her wheelchair-  Her skin is warm and dry.Venous stasis changes lower extremities bilaterally  I do not appreciate any increased erythema or warmth   .Marland Kitchen   Chest --- no labored breathing.-Clear to auscultation . Minimal expiratory wheezing which is not  new  Heart is regular rate and rhythm without murmur gallop or rub -she has chronic venous stasis changes bilaterally lower extremitiesas noted aboveedema appears to  improved overthe course of the past few weeks  Abdomen is obese soft nontender positive bowel sounds.Baseline exam  Muscle skeletal moves all extremities at baseline is ambulating in a wheelchair-do not note any acute deformities she does have arthritic changes continues have some pain with palpitation of her legs but this is not new.    Neurologic is grossly intact her speech is clear I don't see any focal weaknesses cranial nerves are grossly intact.  Psych she is oriented to self she is pleasant conversant--in very good spirits-   Labs reviewed: 02/13/2017.  Sodium 141 potassium 4.5 BUN 57 creatinine 1.63  RecentLabs(withinlast365days)   Recent Labs  11/07/16 0700 12/12/16 0800 02/06/17 0800  NA 141 139 139  K 4.1 3.9 4.1  CL 105 103 103  CO2 28 29 27   GLUCOSE 97 91 92  BUN 43* 38* 52*  CREATININE 1.30* 1.38* 1.41*  CALCIUM 9.3 9.3 9.2      RecentLabs(withinlast365days)   Recent Labs  02/11/16 0740 09/27/16 0715  AST 15 20  ALT 13* 15  ALKPHOS 98 57  BILITOT 0.6 0.5  PROT 7.2 7.1  ALBUMIN 3.8 3.9      RecentLabs(withinlast365days)   Recent Labs   05/30/16 0600 09/27/16 0715 12/12/16 0800  WBC 6.0 4.2 5.2  NEUTROABS 3.2 2.6 2.9  HGB 12.0 11.3* 11.3*  HCT 36.9 34.9* 34.8*  MCV 85.8 86.6 85.5  PLT 210 193 227     RecentLabs       Lab Results  Component Value Date   TSH 1.454 04/10/2015     RecentLabs  Lab Results  Component Value Date   HGBA1C 6.0 (H) 03/09/2016     RecentLabs   Assessment and plan.  Mildly progressing renal insufficiency with a BUN of 57 now in creatinine rising at 1.63.  Clinically she appears to be doing well but creatinine and BUN are progressively rising-at this point will hold her Lasix until further notice-also will hold her potassium at 10 mEq a day.  Her edema actually appears to have improved recently as well as edema mostly still has significant venous stasis changes.  Her weights will have to be monitored closely-since  is now off the Lasix  Hopefully -this will provide some relief however in regards to her renal insufficiency-will update a metabolic panel later this week to keep an eye on the situation.  #2 hypertension again she has variable blood pressures will write an order to hold her Norvasc and Lotensin or hydralazine for systolic blood pressure less than 105-she continues on hydralazine 25 mg 3 times a day-Lotensin 40 mg a hours Norvasc 5 mg a day.  [id number] greater than 25 minutes spent assessing patient-reviewing her chart-reviewing her labs-reviewing her recent blood pressures-and coordinating plan of care-of note greater than 50% of time spent coordinating plan of care with input as noted above

## 2017-02-15 ENCOUNTER — Encounter (HOSPITAL_COMMUNITY)
Admission: RE | Admit: 2017-02-15 | Discharge: 2017-02-15 | Disposition: A | Discharge status: Home / Self Care | Payer: Medicare Other | Source: Skilled Nursing Facility | Attending: Internal Medicine | Admitting: Internal Medicine

## 2017-02-15 ENCOUNTER — Encounter: Payer: Self-pay | Admitting: Internal Medicine

## 2017-02-15 ENCOUNTER — Non-Acute Institutional Stay (SKILLED_NURSING_FACILITY): Payer: Medicare Other | Admitting: Internal Medicine

## 2017-02-15 DIAGNOSIS — I82402 Acute embolism and thrombosis of unspecified deep veins of left lower extremity: Secondary | ICD-10-CM

## 2017-02-15 DIAGNOSIS — N183 Chronic kidney disease, stage 3 unspecified: Secondary | ICD-10-CM

## 2017-02-15 DIAGNOSIS — F419 Anxiety disorder, unspecified: Secondary | ICD-10-CM | POA: Insufficient documentation

## 2017-02-15 DIAGNOSIS — E785 Hyperlipidemia, unspecified: Secondary | ICD-10-CM

## 2017-02-15 DIAGNOSIS — K219 Gastro-esophageal reflux disease without esophagitis: Secondary | ICD-10-CM | POA: Diagnosis not present

## 2017-02-15 DIAGNOSIS — I10 Essential (primary) hypertension: Secondary | ICD-10-CM

## 2017-02-15 DIAGNOSIS — R609 Edema, unspecified: Secondary | ICD-10-CM | POA: Insufficient documentation

## 2017-02-15 DIAGNOSIS — F0391 Unspecified dementia with behavioral disturbance: Secondary | ICD-10-CM

## 2017-02-15 DIAGNOSIS — F339 Major depressive disorder, recurrent, unspecified: Secondary | ICD-10-CM | POA: Insufficient documentation

## 2017-02-15 DIAGNOSIS — F411 Generalized anxiety disorder: Secondary | ICD-10-CM

## 2017-02-15 DIAGNOSIS — I509 Heart failure, unspecified: Secondary | ICD-10-CM | POA: Insufficient documentation

## 2017-02-15 DIAGNOSIS — K210 Gastro-esophageal reflux disease with esophagitis: Secondary | ICD-10-CM | POA: Insufficient documentation

## 2017-02-15 NOTE — Progress Notes (Signed)
Location:   [place] Room Number: 122/D Place of Service:  SNF (31) Provider:  Alonza Bogus, Anthoney Harada, PA-C  Patient Care Team: [name] as PCP - General (Internal Medicine) [name], MD as Consulting Physician (Geriatric Medicine)  Extended Emergency Contact Information Primary Emergency Contact: [name], [place] [address] [name] of [place] Phone: [phone] Relation: Daughter Secondary Emergency Contact: [name] States of [place] Phone: [phone] Relation: Son  Code Status:  DNR Goals of care: Advanced Directive information Advanced Directives 02/15/2017  Does Patient Have a Medical Advance Directive? Yes  Type of Advance Directive Out of facility DNR (pink MOST or yellow form)  Does patient want to make changes to medical advance directive? No - Patient declined  Copy of [place] in Chart? -  Pre-existing out of facility DNR order (yellow form or pink MOST form) -     Chief Complaint  Patient presents with  . Medical Management of Chronic Issues    Routine Visit  Medical management of chronic medical conditions including dementia-hypertension-diastolic CHF-anxiety-GERD-chronic kidney disease-  HPI:  Pt is a 83 y.o. female seen today for medical management of chronic diseases.  As noted above.  Most recent acute issue was somewhat of an elevated creatinine and BUN with a creatinine of 1.63 BUN of 57 this is  above her baseline --- she does have a history of diastolic CHF grade 1.-As well as chronic kidney disease stage III  She had been on Lasix 40 mg a day but we reduced that last week down to 20 mg a day with hopes her creatinine wast responsive however it did go up somewhat to 1.63 and had been 1.413 reduced her Lasix previously.--Previously her creatinine had been around 1.3 and BUN 30-40 range.  She has lost a significant amount of  weight-approximately 15 pounds since April she desires this and says she is eating smaller portions.  Her edema does appear to be somewhat improved despite her lower Lasix dose.  She is not complaining of any shortness of breath chest pain appears to be in good spirits.  Also nursing staff thought she had some fairly persistent anxiety we did increase her Ativan recently up to 0.5 mg twice a day and apparently this is helping some  Her other conditions appear to be stable--she does have dementia that appears to be moderate does well supportive care she is on Aricept.  She also has a distant history of a possible distal femoral DVT--initial Doppler showed small amount of age indeterminate possibly acute nonocclusive DVT in the distal left femoral vein-she was on Eliquis for prolonged course-this was attempted be switched to aspirin but she stated she had an aspirin allergy and refused it-nonetheless we did do an updated Doppler which actually did not show a DVT which was reassuring ---  She also has a history of hyperlipidemia she is on lovastatin lipid panel back in July 2017 showed an LDL 93 HDL of 101.  Today she appears to be in good spirits does not appear to be anxious is laughing cheerful-says she's feeling well-at times she will have various complaints which may be contributed to by her anxiety-1. melena chest pain but this was quickly relieved with nitroglycerin 1. This is probably more anxiety related.  She does have a history of GERD she is on Protonix she has had GI evaluation in the past CT  scan of the abdomen was negative for any acute process she did have an EGD done 2012 that showed age-related esophageal dysmotility  She also has a history of hypertension with variable blood pressures sometimes somewhat high and sometimes somewhat low and she is essentially asymptomatic she is on hydralazine 75mg  3 times a day-Lotensin 40 mg daily as well as Norvasc 5 mg a day listed blood  pressure today was 137/56-I took it around noon 104/50-at times she has systolics in the 353G or higher but again there is no consistency to this.   Clinically she appears to be stable       Past Medical History:  Diagnosis Date  . Anxiety   . Cellulitis 2016  . CHF (congestive heart failure) ([place])   . Chronic kidney disease   . Dementia   . Dysphagia   . Gait abnormality   . GERD (gastroesophageal reflux disease)   . Glaucoma   . Gout   . Hyperlipidemia   . Hypertension   . Muscle weakness   . Myocardial infarction [facility]) [place]  . Osteoarthritis   . Syncope    Past Surgical History:  Procedure Laterality Date  . APPENDECTOMY    . CATARACT EXTRACTION W/PHACO  08/04/2011   Procedure: CATARACT EXTRACTION PHACO AND INTRAOCULAR LENS PLACEMENT (IOC);  Surgeon: Tonny Branch;  Location: [facility];  Service: Ophthalmology;  Laterality: Right;  CDE=18.53  . CATARACT EXTRACTION W/PHACO  08/25/2011   Procedure: CATARACT EXTRACTION PHACO AND INTRAOCULAR LENS PLACEMENT (IOC);  Surgeon: Tonny Branch;  Location: [facility];  Service: Ophthalmology;  Laterality: Left;  CDE:14.76  . TONSILLECTOMY      Allergies  Allergen Reactions  . Bee Venom Anaphylaxis  . Aspirin     Directed by physician  . Cabbage Other (See Comments)    Certain foods due to gout flares  . Latex Rash    Outpatient Encounter Prescriptions as of 02/15/2017  Medication Sig  . acetaminophen (TYLENOL) 500 MG tablet Take 1,000 mg by mouth 2 (two) times daily. For leg pain and arthritis. May have additional 1000 mg for break through pain  . amLODipine (NORVASC) 5 MG tablet Take 5 mg by mouth daily.  . benazepril (LOTENSIN) 40 MG tablet Take 40 mg by mouth daily.  . Brinzolamide-Brimonidine (SIMBRINZA) 1-0.2 % SUSP Apply to eye. One  drop into both eyes three times a day  . Calcium Carbonate-Vitamin D (CALCIUM-VITAMIN D) 500-200 MG-UNIT tablet Take 1 tablet by mouth 2 (two) times daily.  . citalopram (CELEXA) 20 MG  tablet Take 20 mg by mouth daily.  . cycloSPORINE (RESTASIS) 0.05 % ophthalmic emulsion Place 1 drop into both eyes 2 (two) times daily.  Marland Kitchen donepezil (ARICEPT) 10 MG tablet Take 10 mg by mouth at bedtime. For dementia  . furosemide (LASIX) 20 MG tablet Take 20 mg by mouth daily.  . hydrALAZINE (APRESOLINE) 25 MG tablet Take 25 g by mouth along with 50 mg to equal 75 mg three times day  . hydrALAZINE (APRESOLINE) 50 MG tablet Take 50 mg by mouth along with 25 mg to equal 75 mg three times a day  . loratadine (CLARITIN) 10 MG tablet Take 10 mg by mouth daily.  Marland Kitchen LORazepam (ATIVAN) 0.5 MG tablet Take 0.5 tablets (0.25 mg total) by mouth 2 (two) times daily.  Marland Kitchen lovastatin (MEVACOR) 40 MG tablet Take 40 mg by mouth at bedtime.  . nitroGLYCERIN (NITROSTAT) 0.4 MG SL tablet Place 0.4 mg under the tongue every  5 (five) minutes as needed for chest pain.   . pantoprazole (PROTONIX) 40 MG tablet Take 40 mg by mouth daily.   Marland Kitchen POTASSIUM PO Take 10 meq by mouth daily  . Travoprost, BAK Free, (TRAVATAN) 0.004 % SOLN ophthalmic solution Place 1 drop into both eyes at bedtime. For glaucoma  . [DISCONTINUED] furosemide (LASIX) 40 MG tablet Give 40 mg only on day 3 and there after   No facility-administered encounter medications on file as of 02/15/2017.      Review of Systems  Gen. she not complaining fever chills has lost weight but says she is eating "better" and desires weight loss  Skin is notcomplaining of any rashes or itching  Head ears eyes nose mouth throat no complaints of visual changes sore throat.  Respiratory is not complaining of shortness breath or cough   Cardiac continues to deny chest pain has chronic lower extremity edemawhich appears to be slowly improving.  GI is not complaining of nausea vomiting diarrhea constipation or abdominal discomfort. Does not complain of GERD symptoms recently  Muscle skeletal and symptoms recently is not really complaining of joint pain today  does have times complain of this.  Neurologic is not complaining of headache dizziness at this time.   psych again does have a history of depression and is on Celexa as well as Ativan for anxiety at this point appears stable-appears to be in very good spirits today   Immunization History  Administered Date(s) Administered  . Influenza Whole 06/11/2007, 10/16/2008, 05/21/2009, 05/05/2010  . Influenza-Unspecified 06/05/2014, 06/02/2016  . Pneumococcal Polysaccharide-23 06/11/2007  . Pneumococcal-Unspecified 06/06/2016  . Td 06/11/2007  . Zoster 05/21/2007   Pertinent  Health Maintenance Due  Topic Date Due  . INFLUENZA VACCINE  03/29/2017  . PNA vac Low Risk Adult (2 of 2 - PCV13) 06/06/2017  . DEXA SCAN  Completed   No flowsheet data found. Functional Status Survey:    Vitals:   02/15/17 1124  BP: (!) 137/56  Pulse: 64  Resp: 16  Temp: 98.5 F (36.9 C)  TempSrc: Oral  Weight: 180 lb (81.6 kg)  Height: 4\' 11"  (1.499 m)   Body mass index is 36.36 kg/m. Physical Exam   -In general this is a very pleasant elderly female in no distress sitting comfortably in her wheelchair  Her skin is warm and dry.Venous stasis changes lower extremities bilaterally -I do not see any increased erythema or warmth that would be concerning I do not appreciate any increased erythema or warmth   .Marland Kitchen   Chest --- no labored breathing.-Clear to auscultation . Did not really hear any expiratory wheezing which occasionally she does have  Heart is regular rate and rhythm without murmur gallop or rub -she has chronic venous stasis changes bilaterally lower extremitiesas noted aboveedema appears to improved overthe course of the past few weeks  Abdomen is obese soft nontender positive bowel sounds.Baseline exam  Muscle skeletal moves all extremities at baseline is ambulating in a wheelchair-do not note any acute deformities she does have arthritic changes continues have some  pain with palpitation of her legs but this is not new.    Neurologic is grossly intact her speech is clear I don't see any focal weaknesses cranial nerves are grossly intact.  Psych she is oriented to self she is pleasant conversant--in very good spirits-   Labs reviewed:  Recent Labs  12/12/16 0800 02/06/17 0800 02/13/17 0330  NA 139 139 141  K 3.9 4.1 4.5  CL 103  103 106  CO2 29 27 27   GLUCOSE 91 92 91  BUN 38* 52* 57*  CREATININE 1.38* 1.41* 1.63*  CALCIUM 9.3 9.2 9.3    Recent Labs  09/27/16 0715  AST 20  ALT 15  ALKPHOS 57  BILITOT 0.5  PROT 7.1  ALBUMIN 3.9    Recent Labs  05/30/16 0600 09/27/16 0715 12/12/16 0800  WBC 6.0 4.2 5.2  NEUTROABS 3.2 2.6 2.9  HGB 12.0 11.3* 11.3*  HCT 36.9 34.9* 34.8*  MCV 85.8 86.6 85.5  PLT 210 193 227   Lab Results  Component Value Date   TSH 1.454 04/10/2015   Lab Results  Component Value Date   HGBA1C 6.0 (H) 03/09/2016   Lab Results  Component Value Date   CHOL 203 (H) 03/09/2016   HDL 101 03/09/2016   LDLCALC 93 03/09/2016   TRIG 45 03/09/2016   CHOLHDL 2.0 03/09/2016    Significant Diagnostic Results in last 30 days:  No results found.  Assessment/Plan   1 renal insufficiency creatinine appears to be creeping up-again she is off Lasix nonetheless edema and weight appears to be stable at this point will monitor will update a BMP which has already been ordered on June 22.  #2 hypertension continues to have variable blood pressure readings but is asymptomatic give any hypotension or excessive hypertension-at this point will monitor and continue current medications including hydralazine Lotensin and Norvasc  #3 history dementia she doing quite well with supportive care she is on Aricept  #4-history of anxiety this has been a significant appears to be doing better with decreasing Ativan twice a day.  She is also on Celexa with coexistent depression again this appears to be stable continues to  be cheerful largely in good spirits with occasional anxiety.   #5 history of distal DVT-as noted above for most recent dietary did not really show clot there were attempts at one point to start on aspirin after she completed Eliquis but she is refusing saying she has an allergy to aspirin.  #6 history of GERD this appears stable on Protonix again she's had GI workup was suggested possible dysmotility nonetheless she appears to be stable she eats well but is trying to eat smaller portions desiring weight loss.  #7-history of glaucoma this appears stable on topical treatment   #6-PVVZSMO of diastolic CHF-edema actually appears to be improved her Lasix has been held secondary to renal issues as noted above-this will need to be monitoring closely but clinically appears stable I could not really appreciate any shortness of breath or chest congestion on exam today she is not complaining of any shortness of breath actually says she feels better and so happy to be losing weight.  Will monitor.  #9 history of anemia suspect there is an element of chronic disease here hemoglobin 7 stable most recently 11.3 back in April-will update this when metabolic panel is updated on June 22.  #10 history of hyperlipidemia again she's been noted to have a very high HDL in the past-most recently 100 LDL was 93 Will update this as well.  Again will update a metabolic panel with her history of renal insufficiency-also a CBC with history of anemia and we will update a fasting lipid panel.  [id number] note greater than 35 minutes spent assessing patient-reviewing her chart-reviewing her labs-and coordinating and formulating a plan to care for numerous diagnoses-of note greater than 50% of time spent coordinating plan of care

## 2017-02-17 ENCOUNTER — Encounter (HOSPITAL_COMMUNITY)
Admission: RE | Admit: 2017-02-17 | Discharge: 2017-02-17 | Disposition: A | Discharge status: Home / Self Care | Payer: Medicare Other | Source: Skilled Nursing Facility | Attending: Internal Medicine | Admitting: Internal Medicine

## 2017-02-17 DIAGNOSIS — F339 Major depressive disorder, recurrent, unspecified: Secondary | ICD-10-CM | POA: Diagnosis not present

## 2017-02-17 DIAGNOSIS — R609 Edema, unspecified: Secondary | ICD-10-CM | POA: Insufficient documentation

## 2017-02-17 DIAGNOSIS — K210 Gastro-esophageal reflux disease with esophagitis: Secondary | ICD-10-CM | POA: Diagnosis not present

## 2017-02-17 DIAGNOSIS — I509 Heart failure, unspecified: Secondary | ICD-10-CM | POA: Diagnosis not present

## 2017-02-17 DIAGNOSIS — F419 Anxiety disorder, unspecified: Secondary | ICD-10-CM | POA: Diagnosis not present

## 2017-02-17 DIAGNOSIS — N189 Chronic kidney disease, unspecified: Secondary | ICD-10-CM | POA: Diagnosis not present

## 2017-02-17 LAB — CBC WITH DIFFERENTIAL/PLATELET
BASOS PCT: 0 %
Basophils Absolute: 0 10*3/uL (ref 0.0–0.1)
EOS ABS: 0.1 10*3/uL (ref 0.0–0.7)
Eosinophils Relative: 3 %
HEMATOCRIT: 34.5 % — AB (ref 36.0–46.0)
Hemoglobin: 11.2 g/dL — ABNORMAL LOW (ref 12.0–15.0)
Lymphocytes Relative: 31 %
Lymphs Abs: 1.3 10*3/uL (ref 0.7–4.0)
MCH: 27.1 pg (ref 26.0–34.0)
MCHC: 32.5 g/dL (ref 30.0–36.0)
MCV: 83.5 fL (ref 78.0–100.0)
MONO ABS: 0.6 10*3/uL (ref 0.1–1.0)
MONOS PCT: 15 %
NEUTROS ABS: 2.1 10*3/uL (ref 1.7–7.7)
Neutrophils Relative %: 51 %
PLATELETS: 211 10*3/uL (ref 150–400)
RBC: 4.13 MIL/uL (ref 3.87–5.11)
RDW: 15.8 % — ABNORMAL HIGH (ref 11.5–15.5)
WBC: 4.2 10*3/uL (ref 4.0–10.5)

## 2017-02-17 LAB — LIPID PANEL
Cholesterol: 215 mg/dL — ABNORMAL HIGH (ref 0–200)
HDL: 103 mg/dL (ref >40)
LDL CALC: 99 mg/dL (ref 0–99)
TRIGLYCERIDES: 66 mg/dL (ref <150)
Total CHOL/HDL Ratio: 2.1 RATIO
VLDL: 13 mg/dL (ref 0–40)

## 2017-02-17 LAB — BASIC METABOLIC PANEL
ANION GAP: 8 (ref 5–15)
BUN: 52 mg/dL — ABNORMAL HIGH (ref 6–20)
CALCIUM: 9.2 mg/dL (ref 8.9–10.3)
CO2: 28 mmol/L (ref 22–32)
CREATININE: 1.46 mg/dL — AB (ref 0.44–1.00)
Chloride: 103 mmol/L (ref 101–111)
GFR calc Af Amer: 35 mL/min — ABNORMAL LOW (ref >60)
GFR calc non Af Amer: 30 mL/min — ABNORMAL LOW (ref >60)
Glucose, Bld: 92 mg/dL (ref 65–99)
Potassium: 3.9 mmol/L (ref 3.5–5.1)
Sodium: 139 mmol/L (ref 135–145)

## 2017-02-20 ENCOUNTER — Non-Acute Institutional Stay (SKILLED_NURSING_FACILITY): Payer: Medicare Other | Admitting: Internal Medicine

## 2017-02-20 ENCOUNTER — Encounter: Payer: Self-pay | Admitting: Internal Medicine

## 2017-02-20 DIAGNOSIS — I5030 Unspecified diastolic (congestive) heart failure: Secondary | ICD-10-CM

## 2017-02-20 DIAGNOSIS — N183 Chronic kidney disease, stage 3 unspecified: Secondary | ICD-10-CM

## 2017-02-20 DIAGNOSIS — I10 Essential (primary) hypertension: Secondary | ICD-10-CM

## 2017-02-20 NOTE — Progress Notes (Signed)
Location:   [place] Room Number: 122/D Place of Service:  SNF [phone]) Provider:  Mickel Duhamel, PA-C  Patient Care Team: [name] as PCP - General (Internal Medicine) [name], MD as Consulting Physician (Geriatric Medicine)  Extended Emergency Contact Information Primary Emergency Contact: [name], [place] [address] [name] of [place] Phone: [phone] Relation: Daughter Secondary Emergency Contact: [name] States of [place] Phone: [phone] Relation: Son  Code Status:  DNR Goals of care: Advanced Directive information Advanced Directives 02/20/2017  Does Patient Have a Medical Advance Directive? Yes  Type of Advance Directive Out of facility DNR (pink MOST or yellow form)  Does patient want to make changes to medical advance directive? No - Patient declined  Copy of [place] in Chart? -  Pre-existing out of facility DNR order (yellow form or pink MOST form) -     Chief Complaint  Patient presents with  . Acute Visit    F/U BP    HPI:  Pt is a 83 y.o. female seen today for an acute visit for Follow-up of hypertension She has a history of hypertension dementia diastolic CHF anxiety GERD chronic kidney disease. She had been on Lasix 7.40 mg a day but secondary to some mildly increased creatinine from her baseline and weight loss we have essentially discontinued it for now-her creatinine appears to show some improvement at 1.46 BUN of 52 on lab done on June 22 it had been as high as 1.63 creatinine with a BUN of 57.  She continues to slowly lose weight currently 177.6-this is down about 15-20 pounds since April-she says she is eating smaller portions is not eating as much and she desires this.  Edema has shown improvemen  We have been following her blood pressure is well what appears to be variable she is on numerous agents including hydralazine  75 mg 3 times a day-Norvasc 5 mg a day as well as Lotensin 40 mg a day.--There are orders to hold the blood pressure medications for systolic blood pressure less than 105-per review of blood pressures today she continues to have variability but there is some concern that time she will have systolics under 188 --I 41/66 at 2 PM back on the 21st-92/42 at at bedtime on the 22nd---morning blood pressures appear to be relatively stable with systolics in the 063K to 160F fairly consistently-later in day again has some variability ranging from systolics in the high 09N to 145 -- mostly in the low 100 range  She is not complaining of any dizziness lightheadedness palpitations or increased weakness  t.       Past Medical History:  Diagnosis Date  . Anxiety   . Cellulitis 2016  . CHF (congestive heart failure) ([place])   . Chronic kidney disease   . Dementia   . Dysphagia   . Gait abnormality   . GERD (gastroesophageal reflux disease)   . Glaucoma   . Gout   . Hyperlipidemia   . Hypertension   . Muscle weakness   . Myocardial infarction [facility]) [place]  . Osteoarthritis   . Syncope    Past Surgical History:  Procedure Laterality Date  . APPENDECTOMY    . CATARACT EXTRACTION W/PHACO  08/04/2011   Procedure: CATARACT EXTRACTION PHACO AND INTRAOCULAR LENS PLACEMENT (IOC);  Surgeon: Tonny Branch;  Location: [facility];  Service:  Ophthalmology;  Laterality: Right;  CDE=18.53  . CATARACT EXTRACTION W/PHACO  08/25/2011   Procedure: CATARACT EXTRACTION PHACO AND INTRAOCULAR LENS PLACEMENT (IOC);  Surgeon: Tonny Branch;  Location: [facility];  Service: Ophthalmology;  Laterality: Left;  CDE:14.76  . TONSILLECTOMY      Allergies  Allergen Reactions  . Bee Venom Anaphylaxis  . Aspirin     Directed by physician  . Cabbage Other (See Comments)    Certain foods due to gout flares  . Latex Rash    Outpatient Encounter Prescriptions as of 02/20/2017  Medication Sig  . acetaminophen (TYLENOL) 500 MG  tablet Take 1,000 mg by mouth 2 (two) times daily. For leg pain and arthritis. May have additional 1000 mg for break through pain  . amLODipine (NORVASC) 5 MG tablet Take 5 mg by mouth daily.  . benazepril (LOTENSIN) 40 MG tablet Take 40 mg by mouth daily.  . Brinzolamide-Brimonidine (SIMBRINZA) 1-0.2 % SUSP Apply to eye. One  drop into both eyes three times a day  . Calcium Carbonate-Vitamin D (CALCIUM-VITAMIN D) 500-200 MG-UNIT tablet Take 1 tablet by mouth 2 (two) times daily.  . citalopram (CELEXA) 20 MG tablet Take 20 mg by mouth daily.  . cycloSPORINE (RESTASIS) 0.05 % ophthalmic emulsion Place 1 drop into both eyes 2 (two) times daily.  Marland Kitchen donepezil (ARICEPT) 10 MG tablet Take 10 mg by mouth at bedtime. For dementia  . hydrALAZINE (APRESOLINE) 25 MG tablet Take 25 g by mouth along with 50 mg to equal 75 mg three times day  . hydrALAZINE (APRESOLINE) 50 MG tablet Take 50 mg by mouth along with 25 mg to equal 75 mg three times a day  . loratadine (CLARITIN) 10 MG tablet Take 10 mg by mouth daily.  Marland Kitchen LORazepam (ATIVAN) 0.5 MG tablet Take 0.5 mg by mouth 2 (two) times daily.  Marland Kitchen lovastatin (MEVACOR) 40 MG tablet Take 40 mg by mouth at bedtime.  . nitroGLYCERIN (NITROSTAT) 0.4 MG SL tablet Place 0.4 mg under the tongue every 5 (five) minutes as needed for chest pain.   . pantoprazole (PROTONIX) 40 MG tablet Take 40 mg by mouth daily.   Marland Kitchen POTASSIUM PO Take 10 meq by mouth daily  . Travoprost, BAK Free, (TRAVATAN) 0.004 % SOLN ophthalmic solution Place 1 drop into both eyes at bedtime. For glaucoma  . [DISCONTINUED] furosemide (LASIX) 20 MG tablet Take 20 mg by mouth daily.  . [DISCONTINUED] LORazepam (ATIVAN) 0.5 MG tablet Take 0.5 tablets (0.25 mg total) by mouth 2 (two) times daily. (Patient taking differently: Take 0.5 mg by mouth 2 (two) times daily. )   No facility-administered encounter medications on file as of 02/20/2017.     Review of Systems Gen. she not complaining fever chills has  lost weight but says she is eating "better" and desires weight loss  Skin is notcomplaining of any rashes or itching  Head ears eyes nose mouth throat no complaints of visual changes sore throat.  Respiratory is not complaining of shortness breath or cough   Cardiac continues to deny chest pain has chronic lower extremity edemawhich appears to be  improving.  GI is not complaining of nausea vomiting diarrhea constipation or abdominal discomfort. Does not complain of GERD symptoms recently  Muscle skeletal and symptoms recently is not really complaining of joint pain today does have times complain of this.  Neurologic is not complaining of headache dizziness at this time.   psych again does have a history of depression and is on  Celexa as well as Ativan for anxiety at this point appears stable-  Immunization History  Administered Date(s) Administered  . Influenza Whole 06/11/2007, 10/16/2008, 05/21/2009, 05/05/2010  . Influenza-Unspecified 06/05/2014, 06/02/2016  . Pneumococcal Polysaccharide-23 06/11/2007  . Pneumococcal-Unspecified 06/06/2016  . Td 06/11/2007  . Zoster 05/21/2007   Pertinent  Health Maintenance Due  Topic Date Due  . INFLUENZA VACCINE  03/29/2017  . PNA vac Low Risk Adult (2 of 2 - PCV13) 06/06/2017  . DEXA SCAN  Completed   No flowsheet data found. Functional Status Survey:    Vitals:   02/20/17 1452 02/20/17 1453 02/20/17 1454  BP: 137/64 (!) 88/39 (!) 100/40  Pulse: 62 63 60    Physical Exam In general this is a very pleasant elderly female in no distress sitting comfortably in her wheelchair  Her skin is warm and dry.Venous stasis changes lower extremities bilaterally -I do not see any increased erythema or warmth that would be concerning I do not appreciate any increased erythema or warmth   .Marland Kitchen   Chest --- no labored breathing.-Clear to auscultation . Did not really hear any expiratory wheezing which occasionally she  does have  Heart is regular rate and rhythm without murmur gallop or rub -she has chronic venous stasis changes bilaterally lower extremitiesas noted aboveedema appears to continue to improve overthe course of the past few weeks  Abdomen is obese soft nontender positive bowel sounds.Baseline exam  Muscle skeletal moves all extremities at baseline is ambulating in a wheelchair-do not note any acute deformities she does have arthritic changes continues have some pain with palpitation of her legs but this is not new.    Neurologic is grossly intact her speech is clear I don't see any focal weaknesses cranial nerves are grossly intact.  Psych she is oriented to self she is pleasant conversant--continues to be in good spirits-   Labs reviewed:  Recent Labs  02/06/17 0800 02/13/17 0330 02/17/17 0707  NA 139 141 139  K 4.1 4.5 3.9  CL 103 106 103  CO2 27 27 28   GLUCOSE 92 91 92  BUN 52* 57* 52*  CREATININE 1.41* 1.63* 1.46*  CALCIUM 9.2 9.3 9.2    Recent Labs  09/27/16 0715  AST 20  ALT 15  ALKPHOS 57  BILITOT 0.5  PROT 7.1  ALBUMIN 3.9    Recent Labs  09/27/16 0715 12/12/16 0800 02/17/17 0707  WBC 4.2 5.2 4.2  NEUTROABS 2.6 2.9 2.1  HGB 11.3* 11.3* 11.2*  HCT 34.9* 34.8* 34.5*  MCV 86.6 85.5 83.5  PLT 193 227 211   Lab Results  Component Value Date   TSH 1.454 04/10/2015   Lab Results  Component Value Date   HGBA1C 6.0 (H) 03/09/2016   Lab Results  Component Value Date   CHOL 215 (H) 02/17/2017   HDL 103 02/17/2017   LDLCALC 99 02/17/2017   TRIG 66 02/17/2017   CHOLHDL 2.1 02/17/2017    Significant Diagnostic Results in last 30 days:  No results found.  Assessment/Plan  #1 hypertension-there is some concern with somewhat more frequent low blood pressure readings this was discussed with Dr. Lyndel Safe and will decrease her hydralazine back to 50 mg 3 times a day-she continues on Norvasc 5 mg a day as well as Lotensin 40 mg a day continue  orders to hold her blood pressure medications for systolic blood pressure less than 105-she appears to be asymptomatic but this will have to be watched.  I suspect her decreased weight  and edema has also contributed to somewhat lower blood pressure.  #2 history of renal insufficiency assertion creatinine of 1.46 and BUN of 52 appears to be relatively close to her baseline she is now off diuretic weight edema appears to be declining actually-at this point continue to monitor and updated metabolic panel has been ordered for later this week to keep an eye on this.   #3 history of diastolic CHF-this appears stable despite being off her diuretic edema and weights continued to decline somewhat although weight appears to be relatively stabilized recently-at this point will monitor clinically chest exam was quite benign.  [id number]

## 2017-02-24 ENCOUNTER — Encounter (HOSPITAL_COMMUNITY)
Admission: RE | Admit: 2017-02-24 | Discharge: 2017-02-24 | Disposition: A | Discharge status: Home / Self Care | Payer: Medicare Other | Source: Skilled Nursing Facility | Attending: Internal Medicine | Admitting: Internal Medicine

## 2017-02-24 DIAGNOSIS — K210 Gastro-esophageal reflux disease with esophagitis: Secondary | ICD-10-CM | POA: Diagnosis not present

## 2017-02-24 DIAGNOSIS — R609 Edema, unspecified: Secondary | ICD-10-CM | POA: Insufficient documentation

## 2017-02-24 DIAGNOSIS — I509 Heart failure, unspecified: Secondary | ICD-10-CM | POA: Diagnosis not present

## 2017-02-24 DIAGNOSIS — F419 Anxiety disorder, unspecified: Secondary | ICD-10-CM | POA: Insufficient documentation

## 2017-02-24 DIAGNOSIS — F339 Major depressive disorder, recurrent, unspecified: Secondary | ICD-10-CM | POA: Insufficient documentation

## 2017-02-24 LAB — BASIC METABOLIC PANEL
Anion gap: 7 (ref 5–15)
BUN: 45 mg/dL — AB (ref 6–20)
CHLORIDE: 105 mmol/L (ref 101–111)
CO2: 27 mmol/L (ref 22–32)
Calcium: 9.4 mg/dL (ref 8.9–10.3)
Creatinine, Ser: 1.31 mg/dL — ABNORMAL HIGH (ref 0.44–1.00)
GFR calc Af Amer: 39 mL/min — ABNORMAL LOW (ref >60)
GFR calc non Af Amer: 34 mL/min — ABNORMAL LOW (ref >60)
GLUCOSE: 114 mg/dL — AB (ref 65–99)
POTASSIUM: 4.7 mmol/L (ref 3.5–5.1)
Sodium: 139 mmol/L (ref 135–145)

## 2017-03-07 ENCOUNTER — Non-Acute Institutional Stay (SKILLED_NURSING_FACILITY): Payer: Medicare Other | Admitting: Internal Medicine

## 2017-03-07 ENCOUNTER — Encounter: Payer: Self-pay | Admitting: Internal Medicine

## 2017-03-07 DIAGNOSIS — M79604 Pain in right leg: Secondary | ICD-10-CM

## 2017-03-07 DIAGNOSIS — I10 Essential (primary) hypertension: Secondary | ICD-10-CM

## 2017-03-07 DIAGNOSIS — N183 Chronic kidney disease, stage 3 unspecified: Secondary | ICD-10-CM

## 2017-03-07 DIAGNOSIS — R609 Edema, unspecified: Secondary | ICD-10-CM | POA: Diagnosis not present

## 2017-03-07 DIAGNOSIS — M79605 Pain in left leg: Secondary | ICD-10-CM

## 2017-03-07 NOTE — Progress Notes (Signed)
Location:   [facility] Room Number: 122/D Place of Service:  SNF [phone]) Provider:  Mickel Duhamel, PA-C  Patient Care Team: [name] as PCP - General (Internal Medicine) [name], MD as Consulting Physician (Geriatric Medicine)  Extended Emergency Contact Information Primary Emergency Contact: [name], [place] [address] [name] of [place] Phone: [phone] Relation: Daughter Secondary Emergency Contact: [name] States of [place] Phone: [phone] Relation: Son  Code Status:  DNR Goals of care: Advanced Directive information Advanced Directives 03/07/2017  Does Patient Have a Medical Advance Directive? Yes  Type of Advance Directive Out of facility DNR (pink MOST or yellow form)  Does patient want to make changes to medical advance directive? No - Patient declined  Copy of [place] in Chart? -  Pre-existing out of facility DNR order (yellow form or pink MOST form) -     Chief Complaint  Patient presents with  . Acute Visit    Pain and Swelling in legs    HPI:  Pt is a 83 y.o. female seen today for an acute visit for Complaints of pain and swelling of her legs-she does have chronic venous stasis at one point she had been on Lasix but this has been put on hold secondary to worsening renal insufficiency-with a history of diastolic CHF-however her BNP is in the past and been quite unremarkable-  Her creatinine has improved had been as high as 1.63 with a BUN of 57-on lab done on June 29 creatinine had improved back to relatively baseline at 1.31 with a BUN of 45.  Ears her weight is relatively stable at 182.4 pounds she has had a significant weight loss here for the past several weeks but she desires this according in nursing she is eating a bit better and is not eating as much-and she wants to lose this weight.  She continues with what appears to be  relatively baseline venous stasis lower extremity edema.  She initially earlier today complained of leg pain but cream was applied and she says it feels better now.      Past Medical History:  Diagnosis Date  . Anxiety   . Cellulitis 2016  . CHF (congestive heart failure) ([place])   . Chronic kidney disease   . Dementia   . Dysphagia   . Gait abnormality   . GERD (gastroesophageal reflux disease)   . Glaucoma   . Gout   . Hyperlipidemia   . Hypertension   . Muscle weakness   . Myocardial infarction [facility]) [place]  . Osteoarthritis   . Syncope    Past Surgical History:  Procedure Laterality Date  . APPENDECTOMY    . CATARACT EXTRACTION W/PHACO  08/04/2011   Procedure: CATARACT EXTRACTION PHACO AND INTRAOCULAR LENS PLACEMENT (IOC);  Surgeon: Tonny Branch;  Location: [facility];  Service: Ophthalmology;  Laterality: Right;  CDE=18.53  . CATARACT EXTRACTION W/PHACO  08/25/2011   Procedure: CATARACT EXTRACTION PHACO AND INTRAOCULAR LENS PLACEMENT (IOC);  Surgeon: Tonny Branch;  Location: [facility];  Service: Ophthalmology;  Laterality: Left;  CDE:14.76  . TONSILLECTOMY      Allergies  Allergen Reactions  . Bee Venom Anaphylaxis  . Aspirin     Directed by physician  . Cabbage Other (See Comments)    Certain foods due to gout flares  . Latex Rash    Outpatient  Encounter Prescriptions as of 03/07/2017  Medication Sig  . acetaminophen (TYLENOL) 500 MG tablet Take 1,000 mg by mouth 2 (two) times daily. For leg pain and arthritis. May have additional 1000 mg for break through pain  . amLODipine (NORVASC) 5 MG tablet Take 5 mg by mouth daily.  . benazepril (LOTENSIN) 40 MG tablet Take 40 mg by mouth daily.  . Brinzolamide-Brimonidine (SIMBRINZA) 1-0.2 % SUSP Apply to eye. One  drop into both eyes three times a day  . Calcium Carbonate-Vitamin D (CALCIUM-VITAMIN D) 500-200 MG-UNIT tablet Take 1 tablet by mouth 2 (two) times daily.  . citalopram (CELEXA) 20 MG tablet Take 20 mg by  mouth daily.  . cycloSPORINE (RESTASIS) 0.05 % ophthalmic emulsion Place 1 drop into both eyes 2 (two) times daily.  Marland Kitchen donepezil (ARICEPT) 10 MG tablet Take 10 mg by mouth at bedtime. For dementia  . hydrALAZINE (APRESOLINE) 50 MG tablet Take 50 mg by mouth hold for systolic B/P less than 144 give three times a day  . loratadine (CLARITIN) 10 MG tablet Take 10 mg by mouth daily.  Marland Kitchen LORazepam (ATIVAN) 0.5 MG tablet Take 0.5 mg by mouth 2 (two) times daily.  Marland Kitchen lovastatin (MEVACOR) 40 MG tablet Take 40 mg by mouth at bedtime.  . nitroGLYCERIN (NITROSTAT) 0.4 MG SL tablet Place 0.4 mg under the tongue every 5 (five) minutes as needed for chest pain.   . pantoprazole (PROTONIX) 40 MG tablet Take 40 mg by mouth daily.   Marland Kitchen POTASSIUM PO Take 10 meq by mouth daily  . Travoprost, BAK Free, (TRAVATAN) 0.004 % SOLN ophthalmic solution Place 1 drop into both eyes at bedtime. For glaucoma  . [DISCONTINUED] hydrALAZINE (APRESOLINE) 25 MG tablet Take 25 g by mouth along with 50 mg to equal 75 mg three times day   No facility-administered encounter medications on file as of 03/07/2017.      Review of Systems  Gen. she not complaining fever chills has lost weight but says she is eating "better" and desires weight loss-Last few days her weight appears to be trending up slightly 2-4 pounds-  Skin is notcomplaining of any rashes or itching  Head ears eyes nose mouth throat no complaints of visual changes sore throat.  Respiratory is not complaining of shortness breath or cough   Cardiac continues to deny chest pain has chronic lower extremity edemashe thinks maybe this has increased although this is not grossly apparent  GI is not complaining of nausea vomiting diarrhea constipation or abdominal discomfort.Does not complain of GERD symptoms recently  Muscle ske[place] symptoms recentlyis not really complaining of joint pain but has complained of bilateral leg pain she says it is somewhat improved  now when cream was applied  Neurologic is not complaining of headache dizziness at this time.   psych again does have a history of depression and is on Celexa as well as Ativan for anxiety at this point appears stable- Immunization History  Administered Date(s) Administered  . Influenza Whole 06/11/2007, 10/16/2008, 05/21/2009, 05/05/2010  . Influenza-Unspecified 06/05/2014, 06/02/2016  . Pneumococcal Polysaccharide-23 06/11/2007  . Pneumococcal-Unspecified 06/06/2016  . Td 06/11/2007  . Zoster 05/21/2007   Pertinent  Health Maintenance Due  Topic Date Due  . INFLUENZA VACCINE  03/29/2017  . PNA vac Low Risk Adult (2 of 2 - PCV13) 06/06/2017  . DEXA SCAN  Completed   No flowsheet data found. Functional Status Survey:    She is afebrile pulse 76 respirations of 18 blood pressure taken manually  104/58 previous list of blood pressures 142/66-138/81 weight is 182.4 this appears again last few days of 2-4 pounds Physical Exam In general this is a very pleasant elderly female in no distress sitting comfortably in her wheelchair  Her skin is warm and dry.Venous stasis changes lower extremities bilaterally - This continues to be fairly significant but not grossly changed from baseline   .Marland Kitchen   Chest --- no labored breathing.-Clear to auscultation . Did not really hear any expiratory wheezing which occasionally she does have  Heart is regular rate and rhythm without murmur gallop or rub -she has chronic venous stasis changes bilaterally lower extremitiesas noted aboveedema -again this is significant but not grossly increased from baseline  Abdomen is obese soft nontender positive bowel sounds.Baseline exam  Muscle skeletal moves all extremities at baseline is ambulating in a wheelchair-do not note any acute deformities she does have arthritic changes continues have some pain with palpitation of her l legs and feet but this is not really new-she has had cream applied to  her legs and she said this is helping    Neurologic is grossly intact her speech is clear I don't see any focal weaknesses cranial nerves are grossly intact.  Psych she is oriented to self she is pleasant conversant--continues to be in good spirits-  Labs reviewed:  Recent Labs  02/13/17 0330 02/17/17 0707 02/24/17 1030  NA 141 139 139  K 4.5 3.9 4.7  CL 106 103 105  CO2 27 28 27   GLUCOSE 91 92 114*  BUN 57* 52* 45*  CREATININE 1.63* 1.46* 1.31*  CALCIUM 9.3 9.2 9.4    Recent Labs  09/27/16 0715  AST 20  ALT 15  ALKPHOS 57  BILITOT 0.5  PROT 7.1  ALBUMIN 3.9    Recent Labs  09/27/16 0715 12/12/16 0800 02/17/17 0707  WBC 4.2 5.2 4.2  NEUTROABS 2.6 2.9 2.1  HGB 11.3* 11.3* 11.2*  HCT 34.9* 34.8* 34.5*  MCV 86.6 85.5 83.5  PLT 193 227 211   Lab Results  Component Value Date   TSH 1.454 04/10/2015   Lab Results  Component Value Date   HGBA1C 6.0 (H) 03/09/2016   Lab Results  Component Value Date   CHOL 215 (H) 02/17/2017   HDL 103 02/17/2017   LDLCALC 99 02/17/2017   TRIG 66 02/17/2017   CHOLHDL 2.1 02/17/2017    Significant Diagnostic Results in last 30 days:  No results found.  Assessment/Plan  #1 leg pain-apparently she's had cream applied and says this helped-she says when the air conditioning was turned on today's when she felt the leg pain apparently her legs did not do well with cold air according to her.  At this point will monitor-I do note her Lasix has been on hold now for several weeks-she has gained a small amount of weight although edema appears to be fairly baseline.  We will need to monitor weights carefully-also will obtain a BN P tomorrow again they have been unremarkable in the past but now she is off the Lasix would like to update this.  Also I note she is on low-dose potassium but is not on Lasix anymore we will DC the potassium for now as well.  And will update a metabolic panel as well tomorrow. to check on her  electrolytes and renal function with a history of chronic renal insufficiency which has improved after Lasix  Discontinued Clinically she appears stable and at her baseline appears to be more comfortable now apparently  the air conditioning has been modified and she has cream to her legs.   #2 in regards to hypertension this appears stable she has variable systolics throughout the day but is asymptomatic of acute hypotension-blood pressure tonight was 104/58 continues on hydralazine Lotensin and Norvasc    [id number]-of note greater than 25 minutes spent assessing patient-addressing concerns-discussing her status with nursing staff-and formulating a plan of care

## 2017-03-08 ENCOUNTER — Other Ambulatory Visit (HOSPITAL_COMMUNITY)
Admission: RE | Admit: 2017-03-08 | Discharge: 2017-03-08 | Disposition: A | Discharge status: Home / Self Care | Payer: Medicare Other | Source: Skilled Nursing Facility | Attending: Internal Medicine | Admitting: Internal Medicine

## 2017-03-08 DIAGNOSIS — F419 Anxiety disorder, unspecified: Secondary | ICD-10-CM | POA: Insufficient documentation

## 2017-03-08 DIAGNOSIS — R609 Edema, unspecified: Secondary | ICD-10-CM | POA: Insufficient documentation

## 2017-03-08 DIAGNOSIS — K210 Gastro-esophageal reflux disease with esophagitis: Secondary | ICD-10-CM | POA: Diagnosis not present

## 2017-03-08 DIAGNOSIS — I509 Heart failure, unspecified: Secondary | ICD-10-CM | POA: Insufficient documentation

## 2017-03-08 DIAGNOSIS — F339 Major depressive disorder, recurrent, unspecified: Secondary | ICD-10-CM | POA: Diagnosis not present

## 2017-03-08 LAB — BASIC METABOLIC PANEL
Anion gap: 11 (ref 5–15)
BUN: 43 mg/dL — ABNORMAL HIGH (ref 6–20)
CO2: 26 mmol/L (ref 22–32)
Calcium: 9.4 mg/dL (ref 8.9–10.3)
Chloride: 102 mmol/L (ref 101–111)
Creatinine, Ser: 1.44 mg/dL — ABNORMAL HIGH (ref 0.44–1.00)
GFR calc Af Amer: 35 mL/min — ABNORMAL LOW (ref >60)
GFR, EST NON AFRICAN AMERICAN: 30 mL/min — AB (ref >60)
Glucose, Bld: 89 mg/dL (ref 65–99)
POTASSIUM: 4.1 mmol/L (ref 3.5–5.1)
Sodium: 139 mmol/L (ref 135–145)

## 2017-03-08 LAB — BRAIN NATRIURETIC PEPTIDE: B Natriuretic Peptide: 77 pg/mL (ref 0.0–100.0)

## 2017-03-13 ENCOUNTER — Non-Acute Institutional Stay (SKILLED_NURSING_FACILITY): Payer: Medicare Other | Admitting: Internal Medicine

## 2017-03-13 ENCOUNTER — Encounter: Payer: Self-pay | Admitting: Internal Medicine

## 2017-03-13 DIAGNOSIS — F0391 Unspecified dementia with behavioral disturbance: Secondary | ICD-10-CM | POA: Diagnosis not present

## 2017-03-13 DIAGNOSIS — N183 Chronic kidney disease, stage 3 unspecified: Secondary | ICD-10-CM

## 2017-03-13 DIAGNOSIS — I10 Essential (primary) hypertension: Secondary | ICD-10-CM

## 2017-03-13 DIAGNOSIS — E785 Hyperlipidemia, unspecified: Secondary | ICD-10-CM | POA: Diagnosis not present

## 2017-03-13 DIAGNOSIS — R739 Hyperglycemia, unspecified: Secondary | ICD-10-CM

## 2017-03-13 NOTE — Progress Notes (Signed)
Location:   [facility] Room Number: 122/D Place of Service:  SNF (31) Provider:  Malen Gauze, Anthoney Harada, PA-C  Patient Care Team: [name] as PCP - General (Internal Medicine) [name], MD as Consulting Physician (Geriatric Medicine)  Extended Emergency Contact Information Primary Emergency Contact: [name], [place] [address] [name] of [place] Phone: [phone] Relation: Daughter Secondary Emergency Contact: [name] States of [place] Phone: [phone] Relation: Son  Code Status:  DNR Goals of care: Advanced Directive information Advanced Directives 03/13/2017  Does Patient Have a Medical Advance Directive? Yes  Type of Advance Directive Out of facility DNR (pink MOST or yellow form)  Does patient want to make changes to medical advance directive? No - Patient declined  Copy of [place] in Chart? -  Pre-existing out of facility DNR order (yellow form or pink MOST form) -     Chief Complaint  Patient presents with  . Medical Management of Chronic Issues    Routine Visit    HPI:  Pt is a 83 y.o. female seen today for medical management of chronic diseases.    Patient has H/o Hypertension, Distal DVT, Chronic venous stasis, Diastolic CHF chronic Renal disaese and GERD Glaucoma Dementia and Anxiety  Patient was again upset this morning and had lots of Nursing issues. Per Nurses she gets upset very easily. She has gained some weight recently after loosing weight and she says it is due to Fluid. Reviewing her visit notes patient has difficulty in her BP control.From Systolic Hig 250 to low of 100. She also has lost some weight almost 15 lbs since April and per patient that has been intentional as she is cutting back her Food intake.  She weighs 182 lbs now.which is still 10 bs less the her weight in 02/18 Appetite stays good and no nursing issues except some  emotional Out burst. Past Medical History:  Diagnosis Date  . Anxiety   . Cellulitis 2016  . CHF (congestive heart failure) ([place])   . Chronic kidney disease   . Dementia   . Dysphagia   . Gait abnormality   . GERD (gastroesophageal reflux disease)   . Glaucoma   . Gout   . Hyperlipidemia   . Hypertension   . Muscle weakness   . Myocardial infarction [facility]) [place]  . Osteoarthritis   . Syncope    Past Surgical History:  Procedure Laterality Date  . APPENDECTOMY    . CATARACT EXTRACTION W/PHACO  08/04/2011   Procedure: CATARACT EXTRACTION PHACO AND INTRAOCULAR LENS PLACEMENT (IOC);  Surgeon: Tonny Branch;  Location: [facility];  Service: Ophthalmology;  Laterality: Right;  CDE=18.53  . CATARACT EXTRACTION W/PHACO  08/25/2011   Procedure: CATARACT EXTRACTION PHACO AND INTRAOCULAR LENS PLACEMENT (IOC);  Surgeon: Tonny Branch;  Location: [facility];  Service: Ophthalmology;  Laterality: Left;  CDE:14.76  . TONSILLECTOMY      Allergies  Allergen Reactions  . Bee Venom Anaphylaxis  . Aspirin     Directed by physician  . Cabbage Other (See Comments)    Certain foods due to gout flares  . Latex Rash    Outpatient Encounter Prescriptions as of 03/13/2017  Medication Sig  . acetaminophen (TYLENOL) 500 MG tablet Take 1,000 mg by mouth 2 (two) times daily. For leg pain and arthritis. May have additional 1000 mg for break through pain  .  amLODipine (NORVASC) 5 MG tablet Take 5 mg by mouth daily.  . benazepril (LOTENSIN) 40 MG tablet Take 40 mg by mouth daily.  . Brinzolamide-Brimonidine (SIMBRINZA) 1-0.2 % SUSP Apply to eye. One  drop into both eyes three times a day  . Calcium Carbonate-Vitamin D (CALCIUM-VITAMIN D) 500-200 MG-UNIT tablet Take 1 tablet by mouth 2 (two) times daily.  . citalopram (CELEXA) 20 MG tablet Take 20 mg by mouth daily.  . cycloSPORINE (RESTASIS) 0.05 % ophthalmic emulsion Place 1 drop into both eyes 2 (two) times daily.  Marland Kitchen donepezil (ARICEPT) 10 MG tablet Take  10 mg by mouth at bedtime. For dementia  . hydrALAZINE (APRESOLINE) 50 MG tablet Take 50 mg by mouth hold for systolic B/P less than 675 give three times a day  . loratadine (CLARITIN) 10 MG tablet Take 10 mg by mouth daily.  Marland Kitchen LORazepam (ATIVAN) 0.5 MG tablet Take 0.5 mg by mouth 2 (two) times daily.  Marland Kitchen lovastatin (MEVACOR) 40 MG tablet Take 40 mg by mouth at bedtime.  . nitroGLYCERIN (NITROSTAT) 0.4 MG SL tablet Place 0.4 mg under the tongue every 5 (five) minutes as needed for chest pain.   . pantoprazole (PROTONIX) 40 MG tablet Take 40 mg by mouth daily.   . Travoprost, BAK Free, (TRAVATAN) 0.004 % SOLN ophthalmic solution Place 1 drop into both eyes at bedtime. For glaucoma  . [DISCONTINUED] POTASSIUM PO Take 10 meq by mouth daily   No facility-administered encounter medications on file as of 03/13/2017.      Review of Systems  Constitutional: Positive for unexpected weight change. Negative for activity change, appetite change, chills, diaphoresis and fatigue.  HENT: Negative.   Respiratory: Negative for apnea, cough, chest tightness and shortness of breath.   Cardiovascular: Positive for leg swelling. Negative for chest pain.  Gastrointestinal: Negative.   Genitourinary: Negative.   Musculoskeletal: Negative.   Skin: Negative.   Neurological: Positive for weakness. Negative for dizziness, facial asymmetry, light-headedness and headaches.  Psychiatric/Behavioral: Positive for sleep disturbance. The patient is nervous/anxious.     Immunization History  Administered Date(s) Administered  . Influenza Whole 06/11/2007, 10/16/2008, 05/21/2009, 05/05/2010  . Influenza-Unspecified 06/05/2014, 06/02/2016  . Pneumococcal Polysaccharide-23 06/11/2007  . Pneumococcal-Unspecified 06/06/2016  . Td 06/11/2007  . Zoster 05/21/2007   Pertinent  Health Maintenance Due  Topic Date Due  . INFLUENZA VACCINE  03/29/2017  . PNA vac Low Risk Adult (2 of 2 - PCV13) 06/06/2017  . DEXA SCAN   Completed   No flowsheet data found. Functional Status Survey:    There were no vitals filed for this visit. There is no height or weight on file to calculate BMI. Physical Exam  Constitutional: She is oriented to person, place, and time. She appears well-developed and well-nourished.  HENT:  Head: Normocephalic.  Mouth/Throat: Oropharynx is clear and moist.  Eyes: Pupils are equal, round, and reactive to light.  Neck: Neck supple.  Cardiovascular: Normal rate.   Pulmonary/Chest: Effort normal and breath sounds normal. No respiratory distress. She has no wheezes. She has no rales.  Abdominal: Soft. Bowel sounds are normal. She exhibits no distension. There is no tenderness. There is no rebound.  Musculoskeletal:  Patient has Edema with Chronic Venous changes B/L.  Neurological: She is alert and oriented to person, place, and time.  No Focal deficit  Skin: Skin is dry.  Psychiatric: Her mood appears anxious. She exhibits a depressed mood.    Labs reviewed:  Recent Labs  02/17/17 0707 02/24/17 1030  03/08/17 0700  NA 139 139 139  K 3.9 4.7 4.1  CL 103 105 102  CO2 28 27 26   GLUCOSE 92 114* 89  BUN 52* 45* 43*  CREATININE 1.46* 1.31* 1.44*  CALCIUM 9.2 9.4 9.4    Recent Labs  09/27/16 0715  AST 20  ALT 15  ALKPHOS 57  BILITOT 0.5  PROT 7.1  ALBUMIN 3.9    Recent Labs  09/27/16 0715 12/12/16 0800 02/17/17 0707  WBC 4.2 5.2 4.2  NEUTROABS 2.6 2.9 2.1  HGB 11.3* 11.3* 11.2*  HCT 34.9* 34.8* 34.5*  MCV 86.6 85.5 83.5  PLT 193 227 211   Lab Results  Component Value Date   TSH 1.454 04/10/2015   Lab Results  Component Value Date   HGBA1C 6.0 (H) 03/09/2016   Lab Results  Component Value Date   CHOL 215 (H) 02/17/2017   HDL 103 02/17/2017   LDLCALC 99 02/17/2017   TRIG 66 02/17/2017   CHOLHDL 2.1 02/17/2017    Significant Diagnostic Results in last 30 days:  No results found.  Assessment/Plan  Hypertension Patient on Hydralazine , Norvasc  and Lotensin. Her BO was elevated today but she was upset this morning. Will continue same doses. Monitor BP  Diastolic CHF  Recent weight gain and she is off her Lasix due to Worsening renal function.. Will restart her on 20 mg QD.Contiues to says no for asiprin. Chronic renal Insufficiency Creat stable at 1.44 as she has gained some weight and has increased swelling of her legs. Repeat BMP in 2 weeks.  Hyperlipidemia LDL 99 on 06/18 On Lovastatin Dementia with Anxiety Continue on Aricept and Ativan.  She is also on Celexa. D/W Nurses and according to them she gets emotional every few days. At this time will use Ativan PRN No New meds required.  Chronic Venous stasis Continue Ted hose. And restart  Lasix ? Hyperglycemia Will repeat A1C with next Blood draw  GERD On Protonix Family/ staff Communication:   Labs/tests ordered:  BMP, CBC And A1C in 2 weeks Total time spent in this patient care encounter was 25_ minutes; greater than 50% of the visit spent counseling patient and coordinating care for problems addressed at this encounter.

## 2017-03-15 ENCOUNTER — Encounter (HOSPITAL_COMMUNITY)
Admission: RE | Admit: 2017-03-15 | Discharge: 2017-03-15 | Disposition: A | Discharge status: Home / Self Care | Payer: Medicare Other | Source: Skilled Nursing Facility | Attending: Internal Medicine | Admitting: Internal Medicine

## 2017-03-15 DIAGNOSIS — F419 Anxiety disorder, unspecified: Secondary | ICD-10-CM | POA: Insufficient documentation

## 2017-03-15 DIAGNOSIS — I509 Heart failure, unspecified: Secondary | ICD-10-CM | POA: Insufficient documentation

## 2017-03-15 DIAGNOSIS — R609 Edema, unspecified: Secondary | ICD-10-CM | POA: Diagnosis not present

## 2017-03-15 DIAGNOSIS — K210 Gastro-esophageal reflux disease with esophagitis: Secondary | ICD-10-CM | POA: Insufficient documentation

## 2017-03-15 DIAGNOSIS — F339 Major depressive disorder, recurrent, unspecified: Secondary | ICD-10-CM | POA: Diagnosis not present

## 2017-03-15 LAB — BASIC METABOLIC PANEL
Anion gap: 8 (ref 5–15)
BUN: 35 mg/dL — AB (ref 6–20)
CO2: 27 mmol/L (ref 22–32)
Calcium: 9.2 mg/dL (ref 8.9–10.3)
Chloride: 104 mmol/L (ref 101–111)
Creatinine, Ser: 1.39 mg/dL — ABNORMAL HIGH (ref 0.44–1.00)
GFR calc Af Amer: 37 mL/min — ABNORMAL LOW (ref >60)
GFR, EST NON AFRICAN AMERICAN: 32 mL/min — AB (ref >60)
GLUCOSE: 90 mg/dL (ref 65–99)
Potassium: 3.9 mmol/L (ref 3.5–5.1)
Sodium: 139 mmol/L (ref 135–145)

## 2017-03-15 LAB — BRAIN NATRIURETIC PEPTIDE: B Natriuretic Peptide: 64 pg/mL (ref 0.0–100.0)

## 2017-03-20 ENCOUNTER — Encounter (HOSPITAL_COMMUNITY)
Admission: RE | Admit: 2017-03-20 | Discharge: 2017-03-20 | Disposition: A | Discharge status: Home / Self Care | Payer: Medicare Other | Source: Skilled Nursing Facility | Attending: Internal Medicine | Admitting: Internal Medicine

## 2017-03-20 DIAGNOSIS — K210 Gastro-esophageal reflux disease with esophagitis: Secondary | ICD-10-CM | POA: Diagnosis not present

## 2017-03-20 DIAGNOSIS — F339 Major depressive disorder, recurrent, unspecified: Secondary | ICD-10-CM | POA: Diagnosis not present

## 2017-03-20 DIAGNOSIS — R609 Edema, unspecified: Secondary | ICD-10-CM | POA: Diagnosis not present

## 2017-03-20 DIAGNOSIS — F419 Anxiety disorder, unspecified: Secondary | ICD-10-CM | POA: Diagnosis not present

## 2017-03-20 DIAGNOSIS — I509 Heart failure, unspecified: Secondary | ICD-10-CM | POA: Diagnosis not present

## 2017-03-20 LAB — CBC
HEMATOCRIT: 37.6 % (ref 36.0–46.0)
HEMOGLOBIN: 12.1 g/dL (ref 12.0–15.0)
MCH: 27.1 pg (ref 26.0–34.0)
MCHC: 32.2 g/dL (ref 30.0–36.0)
MCV: 84.3 fL (ref 78.0–100.0)
Platelets: 227 10*3/uL (ref 150–400)
RBC: 4.46 MIL/uL (ref 3.87–5.11)
RDW: 17.1 % — ABNORMAL HIGH (ref 11.5–15.5)
WBC: 5.4 10*3/uL (ref 4.0–10.5)

## 2017-03-20 LAB — BASIC METABOLIC PANEL
Anion gap: 10 (ref 5–15)
BUN: 37 mg/dL — ABNORMAL HIGH (ref 6–20)
CHLORIDE: 102 mmol/L (ref 101–111)
CO2: 28 mmol/L (ref 22–32)
Calcium: 9.6 mg/dL (ref 8.9–10.3)
Creatinine, Ser: 1.3 mg/dL — ABNORMAL HIGH (ref 0.44–1.00)
GFR calc non Af Amer: 34 mL/min — ABNORMAL LOW (ref >60)
GFR, EST AFRICAN AMERICAN: 40 mL/min — AB (ref >60)
Glucose, Bld: 77 mg/dL (ref 65–99)
POTASSIUM: 4.1 mmol/L (ref 3.5–5.1)
Sodium: 140 mmol/L (ref 135–145)

## 2017-03-21 LAB — HEMOGLOBIN A1C
Hgb A1c MFr Bld: 5.5 % (ref 4.8–5.6)
MEAN PLASMA GLUCOSE: 111 mg/dL

## 2017-04-04 ENCOUNTER — Encounter (HOSPITAL_COMMUNITY)
Admission: RE | Admit: 2017-04-04 | Discharge: 2017-04-04 | Disposition: A | Discharge status: Home / Self Care | Payer: Medicare Other | Source: Skilled Nursing Facility | Attending: Internal Medicine | Admitting: Internal Medicine

## 2017-04-04 DIAGNOSIS — R609 Edema, unspecified: Secondary | ICD-10-CM | POA: Insufficient documentation

## 2017-04-04 DIAGNOSIS — F419 Anxiety disorder, unspecified: Secondary | ICD-10-CM | POA: Insufficient documentation

## 2017-04-04 DIAGNOSIS — F339 Major depressive disorder, recurrent, unspecified: Secondary | ICD-10-CM | POA: Insufficient documentation

## 2017-04-04 DIAGNOSIS — I509 Heart failure, unspecified: Secondary | ICD-10-CM | POA: Insufficient documentation

## 2017-04-04 DIAGNOSIS — K210 Gastro-esophageal reflux disease with esophagitis: Secondary | ICD-10-CM | POA: Diagnosis not present

## 2017-04-04 LAB — BASIC METABOLIC PANEL
ANION GAP: 7 (ref 5–15)
BUN: 44 mg/dL — ABNORMAL HIGH (ref 6–20)
CO2: 28 mmol/L (ref 22–32)
Calcium: 9 mg/dL (ref 8.9–10.3)
Chloride: 104 mmol/L (ref 101–111)
Creatinine, Ser: 1.53 mg/dL — ABNORMAL HIGH (ref 0.44–1.00)
GFR calc Af Amer: 33 mL/min — ABNORMAL LOW (ref >60)
GFR, EST NON AFRICAN AMERICAN: 28 mL/min — AB (ref >60)
Glucose, Bld: 75 mg/dL (ref 65–99)
POTASSIUM: 4.3 mmol/L (ref 3.5–5.1)
Sodium: 139 mmol/L (ref 135–145)

## 2017-04-11 ENCOUNTER — Encounter (HOSPITAL_COMMUNITY)
Admission: RE | Admit: 2017-04-11 | Discharge: 2017-04-11 | Disposition: A | Discharge status: Home / Self Care | Payer: Medicare Other | Source: Skilled Nursing Facility | Attending: *Deleted | Admitting: *Deleted

## 2017-04-11 DIAGNOSIS — F419 Anxiety disorder, unspecified: Secondary | ICD-10-CM | POA: Diagnosis not present

## 2017-04-11 DIAGNOSIS — K210 Gastro-esophageal reflux disease with esophagitis: Secondary | ICD-10-CM | POA: Diagnosis not present

## 2017-04-11 DIAGNOSIS — I509 Heart failure, unspecified: Secondary | ICD-10-CM | POA: Diagnosis not present

## 2017-04-11 DIAGNOSIS — R609 Edema, unspecified: Secondary | ICD-10-CM | POA: Diagnosis not present

## 2017-04-11 DIAGNOSIS — F339 Major depressive disorder, recurrent, unspecified: Secondary | ICD-10-CM | POA: Diagnosis not present

## 2017-04-11 LAB — BASIC METABOLIC PANEL
Anion gap: 9 (ref 5–15)
BUN: 53 mg/dL — ABNORMAL HIGH (ref 6–20)
CHLORIDE: 103 mmol/L (ref 101–111)
CO2: 27 mmol/L (ref 22–32)
CREATININE: 1.43 mg/dL — AB (ref 0.44–1.00)
Calcium: 9.5 mg/dL (ref 8.9–10.3)
GFR calc non Af Amer: 31 mL/min — ABNORMAL LOW (ref >60)
GFR, EST AFRICAN AMERICAN: 35 mL/min — AB (ref >60)
Glucose, Bld: 91 mg/dL (ref 65–99)
Potassium: 4.1 mmol/L (ref 3.5–5.1)
Sodium: 139 mmol/L (ref 135–145)

## 2017-04-24 ENCOUNTER — Encounter: Payer: Self-pay | Admitting: Internal Medicine

## 2017-04-24 ENCOUNTER — Non-Acute Institutional Stay (SKILLED_NURSING_FACILITY): Payer: Medicare Other | Admitting: Internal Medicine

## 2017-04-24 DIAGNOSIS — F411 Generalized anxiety disorder: Secondary | ICD-10-CM | POA: Diagnosis not present

## 2017-04-24 DIAGNOSIS — N183 Chronic kidney disease, stage 3 unspecified: Secondary | ICD-10-CM

## 2017-04-24 DIAGNOSIS — I10 Essential (primary) hypertension: Secondary | ICD-10-CM | POA: Diagnosis not present

## 2017-04-24 DIAGNOSIS — F0391 Unspecified dementia with behavioral disturbance: Secondary | ICD-10-CM

## 2017-04-24 DIAGNOSIS — E785 Hyperlipidemia, unspecified: Secondary | ICD-10-CM

## 2017-04-24 DIAGNOSIS — I5032 Chronic diastolic (congestive) heart failure: Secondary | ICD-10-CM | POA: Diagnosis not present

## 2017-04-24 NOTE — Progress Notes (Signed)
Location:    [facility] Room Number: 122/D Place of Service:  SNF (31) Provider:  Alonza Bogus, Anthoney Harada, PA-C  Patient Care Team: [name] as PCP - General (Internal Medicine) [name], MD as Consulting Physician (Geriatric Medicine)  Extended Emergency Contact Information Primary Emergency Contact: [name], [place] [address] [name] of [place] Phone: [phone] Relation: Daughter Secondary Emergency Contact: [name] States of [place] Phone: [phone] Relation: Son  Code Status:  DNR Goals of care: Advanced Directive information Advanced Directives 04/24/2017  Does Patient Have a Medical Advance Directive? Yes  Type of Advance Directive Out of facility DNR (pink MOST or yellow form)  Does patient want to make changes to medical advance directive? No - Patient declined  Copy of [place] in Chart? -  Pre-existing out of facility DNR order (yellow form or pink MOST form) -     Chief Complaint  Patient presents with  . Medical Management of Chronic Issues    Routine Visit  For medical management of chronic medical conditions including hypertension-history of venous stasis-diastolic CHF-chronic kidney disease-GERD-dementia and anxiety-acute visit secondary to lip area  blister.    HPI:  Pt is a 83 y.o. female seen today for medical management of chronic diseas as noted above.  Most recent acute issue was some blisters that developed on her mid upper lip under her nose-- does not really complain of pain but says it is somewhat irritated  and itching-she has been started on Abreva for concerns this may be herpes related.  There has not been any sign of cellulitis    Her other medical issues appear to be relatively stable she has had some significant weight loss but and started gaining a small amount of weight---she was assessed by Dr. Lyndel Safe and desire  was  to restart her low-dose Lasix 20 mg a day-however patient apparently is refusing this-nonetheless her weight appears to have stabilized at 182 pounds -edema appears to be relatively at baseline.--She does not complain of any increased shortness of breath beyond baseline.  She does have a history of diastolic CHF but BNPs have been quite unremarkable.  Patient also has variable blood pressures she is on hydralazine and Norvasc as well as Lotensin-earlier today her blood pressure was 152/68 however when I took it this evening her systolic actually was in the 90s is not unusual since come down during the day there are orders to hold her blood pressure medications for systolic blood pressure less than 105. She has not exhibited any symptoms of hypotension and appears to be stable in this regards.--This variability is not new appears to be well tolerated   She has a possible history of a distal femoral DVT-at that time initial Doppler showed a small amount of age-indeterminate possibly nonocclusive DVT in the distal left femoral vein she was on  Eliquis for an extended period there was an attempt to switch to aspirin but she stated she had an aspirin allergy and has refused it.   Did do an update Doppler actually did not show a DVT which continues to be reassuring.  She also has a history of GERD she is on Protonix has had GI evaluation past-past CT scan of the abdomen did not really show any acute process seen did have an EGD done back in 2000 that showed age-related esophageal dysmotility.  Protonix at this point appears  to be effective.  She has a history of persistent chronic kidney disease last creatinine was 1.43 BUN of 53 back on 04/11/2017 and this appears to be relatively baseline-.  She also has a history of dementia which I would classify as  Mild-moderate she does well with supportive care she is bright alert and responds quite appropriately to questions.-- she continues on Aricept  She  also has persistent anxiety on low dose routine twice a day and this appears to have had a stabilizing influence on her anxiety.  She also has a when necessary dose when needed.  She also is on a statin with a history of hyperlipidemia LDL was 99 on lab done in June 2018 of note she does have a consistently high HDL most recently 103 on June lab.  Currently she is sitting in her wheelchair comfortably has no acute complaints she is somewhat concerned about the blister on her lip may did reassure her that we are treating this with topical medication and if not effective certainly will readdress.  At times she also will be concerned about her legs hurting but she is not really complaining about tonight Somewhat proud that she's lost some weight by paying closer attention to what she eats--again as noted above she has actually gained a small amount of weight back but this is stabilized at 182 pounds recently    Past Medical History:  Diagnosis Date  . Anxiety   . Cellulitis 2016  . CHF (congestive heart failure) ([place])   . Chronic kidney disease   . Dementia   . Dysphagia   . Gait abnormality   . GERD (gastroesophageal reflux disease)   . Glaucoma   . Gout   . Hyperlipidemia   . Hypertension   . Muscle weakness   . Myocardial infarction [facility]) [place]  . Osteoarthritis   . Syncope    Past Surgical History:  Procedure Laterality Date  . APPENDECTOMY    . CATARACT EXTRACTION W/PHACO  08/04/2011   Procedure: CATARACT EXTRACTION PHACO AND INTRAOCULAR LENS PLACEMENT (IOC);  Surgeon: Tonny Branch;  Location: [facility];  Service: Ophthalmology;  Laterality: Right;  CDE=18.53  . CATARACT EXTRACTION W/PHACO  08/25/2011   Procedure: CATARACT EXTRACTION PHACO AND INTRAOCULAR LENS PLACEMENT (IOC);  Surgeon: Tonny Branch;  Location: [facility] ORS;  Service: Ophthalmology;  Laterality: Left;  CDE:14.76  . TONSILLECTOMY      Allergies  Allergen Reactions  . Bee Venom Anaphylaxis  . Aspirin      Directed by physician  . Cabbage Other (See Comments)    Certain foods due to gout flares  . Latex Rash    Outpatient Encounter Prescriptions as of 04/24/2017  Medication Sig  . acetaminophen (TYLENOL) 500 MG tablet Take 1,000 mg by mouth 2 (two) times daily. For leg pain and arthritis. May have additional 1000 mg for break through pain  . amLODipine (NORVASC) 5 MG tablet Take 5 mg by mouth daily.  . benazepril (LOTENSIN) 40 MG tablet Take 40 mg by mouth daily.  . Brinzolamide-Brimonidine (SIMBRINZA) 1-0.2 % SUSP Apply to eye. One  drop into both eyes three times a day  . Calcium Carbonate-Vitamin D (CALCIUM-VITAMIN D) 500-200 MG-UNIT tablet Take 1 tablet by mouth 2 (two) times daily.  . citalopram (CELEXA) 20 MG tablet Take 20 mg by mouth daily.  . cycloSPORINE (RESTASIS) 0.05 % ophthalmic emulsion Place 1 drop into both eyes 2 (two) times daily.  Marland Kitchen donepezil (ARICEPT) 10 MG tablet Take  10 mg by mouth at bedtime. For dementia  . hydrALAZINE (APRESOLINE) 50 MG tablet Take 50 mg by mouth hold for systolic B/P less than 093 give three times a day  . loratadine (CLARITIN) 10 MG tablet Take 10 mg by mouth daily.  Marland Kitchen LORazepam (ATIVAN) 0.5 MG tablet Take 0.5 mg by mouth 2 (two) times daily.  Marland Kitchen lovastatin (MEVACOR) 40 MG tablet Take 40 mg by mouth at bedtime.  . nitroGLYCERIN (NITROSTAT) 0.4 MG SL tablet Place 0.4 mg under the tongue every 5 (five) minutes as needed for chest pain.   . pantoprazole (PROTONIX) 40 MG tablet Take 40 mg by mouth daily.   . Travoprost, BAK Free, (TRAVATAN) 0.004 % SOLN ophthalmic solution Place 1 drop into both eyes at bedtime. For glaucoma   No facility-administered encounter medications on file as of 04/24/2017.      Review of Systems    in general she is not complaining fever chlls weigt appears t ae sblz a 84 onds.  Skoes ot [employer] a ter above her right upper lip that she says is irritated feeling.  Head ears eyes nose mouth and throat  no complaints of visual changes or sore throat.  Respiratory is not complaining of shortness of breath at this times occasionally will complain of this especially when she's anxious does not complain of cough.  Cardiac denies any chest pain has chronic venous stasis edema this appears to be stable.  GI is not complaining at this time of dysphagia nausea vomiting diarrhea or constipation.  Muscle skeletal potential complain of joint pain diffuse but is not complaining of that today.  Neurologic does not complain of dizziness headache syncope at this time does not complain of numbness.  Psych does have history of anxiety with depression but at this point appears to be fairly well controlled on current medications including Celexa and Ativan as well as Aricept with her history.    Immunization History  Administered Date(s) Administered  . Influenza Whole 06/11/2007, 10/16/2008, 05/21/2009, 05/05/2010  . Influenza-Unspecified 06/05/2014, 06/02/2016  . Pneumococcal Polysaccharide-23 06/11/2007  . Pneumococcal-Unspecified 06/06/2016  . Td 06/11/2007  . Zoster 05/21/2007   Pertinent  Health Maintenance Due  Topic Date Due  . INFLUENZA VACCINE  07/29/2017 (Originally 03/29/2017)  . PNA vac Low Risk Adult (2 of 2 - PCV13) 06/06/2017  . DEXA SCAN  Completed   No flowsheet data found. Functional Status Survey:    Vitals:   04/24/17 1615  BP: (!) 152/68  Pulse: 66  Resp: 16  Temp: 98.5 F (36.9 C)  TempSrc: Oral  SpO2: 92%  Weight: 182 lb (82.6 kg)  Height: 4\' 11"  (1.499 m)  No blood pressure taken this evening showed a systolic of 94 diastolic of 46 Body mass index is 36.76 kg/m. Physical Exam In general this is a pleasant elderly female in no distress sitting comfortably in her wheelchair.  Her skin is warm and dry she does have venous stasis changes lower extremities but I do not see any concerning erythema or increased warmth that would indicate cellulitis of her  legs  Oropharynx is clear mucous membranes moist.  Eyes pupils appear reactive light sclerae and conjunctivae clear visual acuity appears grossly intact.  Mouth-I do note above her upper lip midline under her nose there is a small  area that appears to have small blisters this is fairly well confined to the mid line area-I do not see  erythema or drainage this appears to be  more of a herpes presentation  Chest --is largely clear to auscultation there is is minimal amount of expiratory wheezing at the bases there is no labored breathing-wheezing is quite intermittent and I suspect is relatively baseline.  Heart is regular rate and rhythm without murmur gallop or rub she has baseline lower extremity edema venous stasis changes-edema appears relatively baseline if anything slightly improved exam.  Abdomen is obese soft nontender positive bowel sounds.  Muscle skeletal is able to move all extremities 4 strength appears to be intact all 4 extremities I do not see changes other than arthritic.  Neurologic is grossly intact her speech is clear no lateralizing findings.  Psych she is alert and oriented largely pleasant and appropriate I was classify her dementia is mild-to-moderate.     Labs reviewed:  Recent Labs  03/20/17 0600 04/04/17 0440 04/11/17 0700  NA 140 139 139  K 4.1 4.3 4.1  CL 102 104 103  CO2 28 28 27   GLUCOSE 77 75 91  BUN 37* 44* 53*  CREATININE 1.30* 1.53* 1.43*  CALCIUM 9.6 9.0 9.5    Recent Labs  09/27/16 0715  AST 20  ALT 15  ALKPHOS 57  BILITOT 0.5  PROT 7.1  ALBUMIN 3.9    Recent Labs  09/27/16 0715 12/12/16 0800 02/17/17 0707 03/20/17 0600  WBC 4.2 5.2 4.2 5.4  NEUTROABS 2.6 2.9 2.1  --   HGB 11.3* 11.3* 11.2* 12.1  HCT 34.9* 34.8* 34.5* 37.6  MCV 86.6 85.5 83.5 84.3  PLT 193 227 211 227   Lab Results  Component Value Date   TSH 1.454 04/10/2015   Lab Results  Component Value Date   HGBA1C 5.5 03/20/2017   Lab Results   Component Value Date   CHOL 215 (H) 02/17/2017   HDL 103 02/17/2017   LDLCALC 99 02/17/2017   TRIG 66 02/17/2017   CHOLHDL 2.1 02/17/2017    Significant Diagnostic Results in last 30 days:  No results found.  Assessment/Plan  #1-hypertension-as noted above there is quite a bit of variability but she appears asymptomatic of any significant hypotension at this point will continue current doses of Norvasc-Lotensin-and hydralazine-there are orders to hold the medications for systolic blood pressure less than 105.  #2 history of edema venous stasis changes diastolic CHF-this appears relatively stable attempts were made to restart her on Lasix but she is refusing this nonetheless or edema and weight appears to have stabilized at this point will continue to monitor she continues to be quite adamant she does not want to take the Lasix.  #3 history of chronic kidney disease creatinine 1.43 appears to be relatively baseline Will monitor this at periodic intervals.  #4 history of dementia this appears to be stable with supportive care she is on Aricept-she also continues on Celexa for coexistent oppression as well as Ativan twice a day for anxiety on these appear relatively stable would be hesitant to make changes secondary to stability at this time since she does have a significant history of anxiety.  #5 history hyperlipidemia LDL was 99 on lab done in June continues on lovastatin-of note her HDL has always been consistently elevated most recently at 103.  #6-history of GERD-at times she will complain of dysphagia but this has not been a complaint for a while now she is on Protonix and she has been seen by GI in the past.  #7 ??? history of distal left femoral DVT again updated Doppler did not really show evidence of this  she is no longer on anticoagulation she has refused aspirin saying she has an allergy to  It  continues to refuse this.  #8-history of glaucoma she is on topical eyedrops this  appears to be relatively stable she does not have visual change complaints today.  #9 history of most likely herpes infection upper lip lower nose area again this is being treated with Abreva will monitor.  14 history of occasional complaints of leg muscle skeletal pain she is on routine Tylenol twice a day and has a when necessary dose this appears well controlled currently I suspect at times her pain complaints are more anxiety related and I think the routine Ativan is probably help with this to some extent.  [id number] note greater than 40 minutes spent assessing patient-reviewing her labs-reviewing her chart-discussing her concerns at bedside-and coordinating and formulating a plan of care for numerous diagnoses-note   greater than 50% of time spent coordinating plan of care  :

## 2017-04-25 DIAGNOSIS — I5030 Unspecified diastolic (congestive) heart failure: Secondary | ICD-10-CM | POA: Insufficient documentation

## 2017-04-27 ENCOUNTER — Encounter: Payer: Self-pay | Admitting: Internal Medicine

## 2017-04-27 ENCOUNTER — Non-Acute Institutional Stay (SKILLED_NURSING_FACILITY): Payer: Medicare Other | Admitting: Internal Medicine

## 2017-04-27 DIAGNOSIS — M546 Pain in thoracic spine: Secondary | ICD-10-CM | POA: Diagnosis not present

## 2017-04-27 DIAGNOSIS — I10 Essential (primary) hypertension: Secondary | ICD-10-CM

## 2017-04-27 DIAGNOSIS — B001 Herpesviral vesicular dermatitis: Secondary | ICD-10-CM | POA: Diagnosis not present

## 2017-04-27 NOTE — Progress Notes (Signed)
Location:    [facility] Room Number: 122/D Place of Service:  SNF [phone]) Provider:  Mickel Duhamel, PA-C  Patient Care Team: [name] as PCP - General (Internal Medicine) [name], MD as Consulting Physician (Geriatric Medicine)  Extended Emergency Contact Information Primary Emergency Contact: [name], [place] [address] [name] of [place] Phone: [phone] Relation: Daughter Secondary Emergency Contact: [name] States of [place] Phone: [phone] Relation: Son  Code Status:  DNR Goals of care: Advanced Directive information Advanced Directives 04/27/2017  Does Patient Have a Medical Advance Directive? Yes  Type of Advance Directive Out of facility DNR (pink MOST or yellow form)  Does patient want to make changes to medical advance directive? No - Patient declined  Copy of [place] in Chart? -  Pre-existing out of facility DNR order (yellow form or pink MOST form) -     Chief Complaint  Patient presents with  . Acute Visit    Back pain    HPI:  Pt is a 83 y.o. female seen today for an acute visit for Complaints of left low back pain left thorax discomfort.  She states this started this morning and apparently when she got out of bed-she denies any recent trauma-she does not complain of any difficulty breathing or chest pain.  Area appears to be mostly her left mid back area posterior thorax area.  There is tenderness to palpation of the area.  Her vital signs are stable otherwise she has no complaints today at times she will have somewhat diffuse musculoskeletal pain usually centered in her legs  She was seen earlier this week for routine visit and thought to be stable with her other medical issues including venous stasis edema chronic kidney disease dementia hyperlipidemia GERD-she also had some blisters over her upper lip thought to be herpes  and this appears to be resolving.       Past Medical History:  Diagnosis Date  . Anxiety   . Cellulitis 2016  . CHF (congestive heart failure) ([place])   . Chronic kidney disease   . Dementia   . Dysphagia   . Gait abnormality   . GERD (gastroesophageal reflux disease)   . Glaucoma   . Gout   . Hyperlipidemia   . Hypertension   . Muscle weakness   . Myocardial infarction [facility]) [place]  . Osteoarthritis   . Syncope    Past Surgical History:  Procedure Laterality Date  . APPENDECTOMY    . CATARACT EXTRACTION W/PHACO  08/04/2011   Procedure: CATARACT EXTRACTION PHACO AND INTRAOCULAR LENS PLACEMENT (IOC);  Surgeon: Tonny Branch;  Location: [facility];  Service: Ophthalmology;  Laterality: Right;  CDE=18.53  . CATARACT EXTRACTION W/PHACO  08/25/2011   Procedure: CATARACT EXTRACTION PHACO AND INTRAOCULAR LENS PLACEMENT (IOC);  Surgeon: Tonny Branch;  Location: [facility];  Service: Ophthalmology;  Laterality: Left;  CDE:14.76  . TONSILLECTOMY      Allergies  Allergen Reactions  . Bee Venom Anaphylaxis  . Aspirin     Directed by physician  . [place]abbage Other (See Comments)    Certain foods due to gout flares  . Latex Rash    Outpatient Encounter Prescriptions as of 04/27/2017  Medication Sig  . acetaminophen (TYLENOL) 500 MG tablet Take 1,000 mg by mouth 2 (two) times daily. For leg pain and arthritis. May have additional  1000 mg for break through pain  . amLODipine (NORVASC) 5 MG tablet Take 5 mg by mouth daily.  . benazepril (LOTENSIN) 40 MG tablet Take 40 mg by mouth daily.  . Brinzolamide-Brimonidine (SIMBRINZA) 1-0.2 % SUSP Apply to eye. One  drop into both eyes three times a day  . Calcium Carbonate-Vitamin D (CALCIUM-VITAMIN D) 500-200 MG-UNIT tablet Take 1 tablet by mouth 2 (two) times daily.  . citalopram (CELEXA) 20 MG tablet Take 20 mg by mouth daily.  . cycloSPORINE (RESTASIS) 0.05 % ophthalmic emulsion Place 1 drop into both eyes 2 (two) times daily.  . Docosanol  (ABREVA) 10 % CREA Apply topically to blister above lip 5 times daily for 7 days. Stop date 04/28/2017  . donepezil (ARICEPT) 10 MG tablet Take 10 mg by mouth at bedtime. For dementia  . hydrALAZINE (APRESOLINE) 50 MG tablet Take 50 mg by mouth hold for systolic B/P less than 182 give three times a day  . loratadine (CLARITIN) 10 MG tablet Take 10 mg by mouth daily.  Marland Kitchen LORazepam (ATIVAN) 0.5 MG tablet Take 0.5 mg by mouth 2 (two) times daily.  Marland Kitchen lovastatin (MEVACOR) 40 MG tablet Take 40 mg by mouth at bedtime.  . nitroGLYCERIN (NITROSTAT) 0.4 MG SL tablet Place 0.4 mg under the tongue every 5 (five) minutes as needed for chest pain.   . pantoprazole (PROTONIX) 40 MG tablet Take 40 mg by mouth daily.   . Travoprost, BAK Free, (TRAVATAN) 0.004 % SOLN ophthalmic solution Place 1 drop into both eyes at bedtime. For glaucoma   No facility-administered encounter medications on file as of 04/27/2017.     Review of Systems   In general she complain a fever or chills.  Skin does not complain of diaphoresis or itching.  Head ears eyes nose mouth and throat does not complain of visual changes or difficulty swallowing.  Respiratory denies any increase shortness of breath or cough beyond baseline does not complain of any difficulty breathing even when taking deep breaths.  Cardiac does not complain of chest pain has chronic venous stasis edema.  GI does not complaining any abdominal discomfort nausea vomiting diarrhea constipation.  Muscle skeletal does complain of some left mid back posterior thorax pain as noted above.  Neurologic does not complain of dizziness headache or syncope.  Psych does have a history of anxiety and depression this appears to be stable she is a bit anxious today about the back pain  Immunization History  Administered Date(s) Administered  . Influenza Whole 06/11/2007, 10/16/2008, 05/21/2009, 05/05/2010  . Influenza-Unspecified 06/05/2014, 06/02/2016  . Pneumococcal  Polysaccharide-23 06/11/2007  . Pneumococcal-Unspecified 06/06/2016  . Td 06/11/2007  . Zoster 05/21/2007   Pertinent  Health Maintenance Due  Topic Date Due  . INFLUENZA VACCINE  07/29/2017 (Originally 03/29/2017)  . PNA vac Low Risk Adult (2 of 2 - PCV13) 06/06/2017  . DEXA SCAN  Completed   No flowsheet data found. Functional Status Survey:    Vitals:   04/27/17 1327  BP: 111/60  Pulse: 72  Resp: 20  Temp: (!) 97.4 F (36.3 C)  TempSrc: Oral  SpO2: 95%    Physical Exam   Gen. this a pleasant elderly female in no distress sitting comfortably in her wheelchair.  Her skin is warm and dry does have venous stasis changes lower extremities but I do not see any sign of cellulitis. Blisters above her upper lip appear to be resolving  Oropharynx continues to be clear mucous membranes moist.  Her  chest is clear to auscultation without rhonchi rales or wheezes there is shallow air entry.  No labored breathing.  Heart is regular rate and rhythm without murmur gallop or rub she has her chronic venous stasis changes edema appears baseline.  Abdomen continues to be obese soft nontender positive bowel sounds.  Muscle skeletal is capable moving all extremities 4 with some lower extremity weakness complicated with her venous stasis edema-I did palpate her back she does appear to have some discomfort when her left mid back/posterior thorax area is palpated I did not note any deformities or lumps-there is no erythema-there is again tenderness to palpation although some of this could be somewhat anxiety related.  Does not appear to have much tenderness to palpation of her actual spine has some discomfort.  Neurologic is grossly intact no lateralizing findings touch sensation appears to be intact lower extremities.  Psych she is grossly alert and oriented pleasant and appropriate little anxious about her back pain    Labs reviewed:  Recent Labs  03/20/17 0600 04/04/17 0440  04/11/17 0700  NA 140 139 139  K 4.1 4.3 4.1  CL 102 104 103  CO2 28 28 27   GLUCOSE 77 75 91  BUN 37* 44* 53*  CREATININE 1.30* 1.53* 1.43*  CALCIUM 9.6 9.0 9.5    Recent Labs  09/27/16 0715  AST 20  ALT 15  ALKPHOS 57  BILITOT 0.5  PROT 7.1  ALBUMIN 3.9    Recent Labs  09/27/16 0715 12/12/16 0800 02/17/17 0707 03/20/17 0600  WBC 4.2 5.2 4.2 5.4  NEUTROABS 2.6 2.9 2.1  --   HGB 11.3* 11.3* 11.2* 12.1  HCT 34.9* 34.8* 34.5* 37.6  MCV 86.6 85.5 83.5 84.3  PLT 193 227 211 227   Lab Results  Component Value Date   TSH 1.454 04/10/2015   Lab Results  Component Value Date   HGBA1C 5.5 03/20/2017   Lab Results  Component Value Date   CHOL 215 (H) 02/17/2017   HDL 103 02/17/2017   LDLCALC 99 02/17/2017   TRIG 66 02/17/2017   CHOLHDL 2.1 02/17/2017    Significant Diagnostic Results in last 30 days:  No results found.  Assessment/Plan  Back pain unknown etiology-this could be muscle strain but will order an x-ray of the area including the thoracic spine and left thorax area-per discussion with patient and nursing the patient has been relieved with Tylenol.  She does not show any signs of respiratory compromise here or difficulty breathing.  Again will await x-ray results.  #2 hypertension this has always been somewhat of an issue with a lot of variability fracture systolic was in the 10G when I evaluated her earlier this week during the day and blood pressure is stable at 111/60 at this point at this point will continue current medications and appears during the day her blood pressure gradually comes down often is somewhat elevated earlier in the day and thencomes down--continue current meds  which includeBenazapril 40 mg a day-hydralazine 50 mg 3 times a day and Norvasc 5 mg a day-there are orders to hold blood pressure medications for systolic blood pressure less than 105.  #3 history of herpes infection upper lip this appears to be resolving unremarkably--e  is completing a course of Abreva   [phone]

## 2017-04-28 DIAGNOSIS — R079 Chest pain, unspecified: Secondary | ICD-10-CM | POA: Diagnosis not present

## 2017-04-28 DIAGNOSIS — M546 Pain in thoracic spine: Secondary | ICD-10-CM | POA: Diagnosis not present

## 2017-05-12 ENCOUNTER — Other Ambulatory Visit: Payer: Self-pay

## 2017-05-12 MED ORDER — LORAZEPAM 0.5 MG PO TABS: 0.5000 mg | ORAL_TABLET | Freq: Two times a day (BID) | ORAL | 0 refills | Status: DC

## 2017-05-12 NOTE — Telephone Encounter (Signed)
RX Fax for [facility]@ [phone]  

## 2017-05-19 ENCOUNTER — Non-Acute Institutional Stay (SKILLED_NURSING_FACILITY): Payer: Medicare Other | Admitting: Internal Medicine

## 2017-05-19 ENCOUNTER — Other Ambulatory Visit (HOSPITAL_COMMUNITY)
Admission: RE | Admit: 2017-05-19 | Discharge: 2017-05-19 | Disposition: A | Discharge status: Home / Self Care | Payer: Medicare Other | Source: Skilled Nursing Facility | Attending: Internal Medicine | Admitting: Internal Medicine

## 2017-05-19 ENCOUNTER — Encounter: Payer: Self-pay | Admitting: Internal Medicine

## 2017-05-19 DIAGNOSIS — I10 Essential (primary) hypertension: Secondary | ICD-10-CM

## 2017-05-19 DIAGNOSIS — R220 Localized swelling, mass and lump, head: Secondary | ICD-10-CM

## 2017-05-19 DIAGNOSIS — I509 Heart failure, unspecified: Secondary | ICD-10-CM | POA: Insufficient documentation

## 2017-05-19 DIAGNOSIS — N183 Chronic kidney disease, stage 3 unspecified: Secondary | ICD-10-CM

## 2017-05-19 LAB — CBC WITH DIFFERENTIAL/PLATELET
BASOS ABS: 0 10*3/uL (ref 0.0–0.1)
Basophils Relative: 0 %
Eosinophils Absolute: 0.2 10*3/uL (ref 0.0–0.7)
Eosinophils Relative: 3 %
HCT: 34 % — ABNORMAL LOW (ref 36.0–46.0)
Hemoglobin: 11.2 g/dL — ABNORMAL LOW (ref 12.0–15.0)
LYMPHS PCT: 24 %
Lymphs Abs: 1.4 10*3/uL (ref 0.7–4.0)
MCH: 28.5 pg (ref 26.0–34.0)
MCHC: 32.9 g/dL (ref 30.0–36.0)
MCV: 86.5 fL (ref 78.0–100.0)
Monocytes Absolute: 1.1 10*3/uL — ABNORMAL HIGH (ref 0.1–1.0)
Monocytes Relative: 19 %
NEUTROS ABS: 3.2 10*3/uL (ref 1.7–7.7)
NEUTROS PCT: 54 %
Platelets: 209 10*3/uL (ref 150–400)
RBC: 3.93 MIL/uL (ref 3.87–5.11)
RDW: 16 % — AB (ref 11.5–15.5)
WBC: 5.8 10*3/uL (ref 4.0–10.5)

## 2017-05-19 LAB — BASIC METABOLIC PANEL
ANION GAP: 8 (ref 5–15)
BUN: 37 mg/dL — ABNORMAL HIGH (ref 6–20)
CALCIUM: 9.4 mg/dL (ref 8.9–10.3)
CO2: 29 mmol/L (ref 22–32)
Chloride: 102 mmol/L (ref 101–111)
Creatinine, Ser: 1.22 mg/dL — ABNORMAL HIGH (ref 0.44–1.00)
GFR, EST AFRICAN AMERICAN: 43 mL/min — AB (ref >60)
GFR, EST NON AFRICAN AMERICAN: 37 mL/min — AB (ref >60)
Glucose, Bld: 85 mg/dL (ref 65–99)
POTASSIUM: 4.1 mmol/L (ref 3.5–5.1)
Sodium: 139 mmol/L (ref 135–145)

## 2017-05-19 NOTE — Progress Notes (Signed)
Location:   [facility] Room Number: 122/D Place of Service:  SNF [phone]) Provider:  Mickel Duhamel, PA-C  Patient Care Team: [name] as PCP - General (Internal Medicine) [name], MD as Consulting Physician (Geriatric Medicine)  Extended Emergency Contact Information Primary Emergency Contact: [name], [place] [address] [name] of [place] Phone: [phone] Relation: Daughter Secondary Emergency Contact: [name] [place] of [place] Phone: [phone] Relation: Son  Code Status:  DNR Goals of care: Advanced Directive information Advanced Directives 05/19/2017  Does Patient Have a Medical Advance Directive? Yes  Type of Advance Directive Out of facility DNR (pink MOST or yellow form)  Does patient want to make changes to medical advance directive? No - Patient declined  Copy of [place] in Chart? -  Pre-existing out of facility DNR order (yellow form or pink MOST form) -     Chief Complaint  Patient presents with  . Acute Visit    Tongue swelling    HPI:  Pt is a 83 y.o. female seen today for an acute visit for Apparently an episode of increased lipids been tongue swelling on left side of her face earlier today.  I was called about this early this morning via phone nursing had noted some swelling over left lip and mild swelling of her tongue there was no difficulty apparently of breathing or any sign of distress she was given a dose of Benadryl-I am following up on this now that I am in the facility.  Per nursing vital signs were stable systolic was somewhat elevated in the 170s earlier today but I took it today later and was 140 suspect her excitement was contributing at times to the elevated reading.  She continues to  Not .complain of any shortness of breath or difficulty swallowing the edema appears to be improved.  Her medications have been  reviewed and I have put her Lotensin on hold.  She does have a history of hypertension complicated at times with some lower readings that are asymptomatic thishass been somewhat variable and challenging in addition to Lotensin she is on hydralazine 50 mg 3 times a day and Norvasc 5 mg a day-again manual blood pressure when I took it was 140/58.  I do not see a prior history of localized edema or swelling-she has not had any new medication she denies eating anything different or having anything topically different on her face  Her other medical conditions include diastolic CHF--she has refused Lasix but edema appears to be at baseline and weight stable actually had significant weight loss that she desire recently.  She also has here chronic kidney disease which  as well as a history of GERD on a proton pump inhibitor.  She also has a history of mild dementia but does very well with supportive care at times will have anxiety and is on Ativan for this and this appears to be effective     Past Medical History:  Diagnosis Date  . Anxiety   . Cellulitis 2016  . CHF (congestive heart failure) ([place])   . Chronic kidney disease   . Dementia   . Dysphagia   . Gait abnormality   . GERD (gastroesophageal reflux disease)   . Glaucoma   . Gout   . Hyperlipidemia   . Hypertension   . Muscle weakness   . Myocardial infarction [facility]) 1990s  [place]  . Osteoarthritis   . Syncope    Past Surgical History:  Procedure Laterality Date  . APPENDECTOMY    . CATARACT EXTRACTION W/PHACO  08/04/2011   Procedure: CATARACT EXTRACTION PHACO AND INTRAOCULAR LENS PLACEMENT (IOC);  Surgeon: Tonny Branch;  Location: [facility];  Service: Ophthalmology;  Laterality: Right;  CDE=18.53  . CATARACT EXTRACTION W/PHACO  08/25/2011   Procedure: CATARACT EXTRACTION PHACO AND INTRAOCULAR LENS PLACEMENT (IOC);  Surgeon: Tonny Branch;  Location: [facility];  Service: Ophthalmology;  Laterality: Left;  CDE:14.76  . TONSILLECTOMY        Allergies  Allergen Reactions  . Bee Venom Anaphylaxis  . Aspirin     Directed by physician  . Cabbage Other (See Comments)    Certain foods due to gout flares  . Latex Rash    Outpatient Encounter Prescriptions as of 05/19/2017  Medication Sig  . acetaminophen (TYLENOL) 500 MG tablet Take 1,000 mg by mouth 2 (two) times daily. For leg pain and arthritis. May have additional 1000 mg for break through pain  . amLODipine (NORVASC) 5 MG tablet Take 5 mg by mouth daily.  . Brinzolamide-Brimonidine (SIMBRINZA) 1-0.2 % SUSP Apply to eye. One  drop into both eyes three times a day  . Calcium Carbonate-Vitamin D (CALCIUM-VITAMIN D) 500-200 MG-UNIT tablet Take 1 tablet by mouth 2 (two) times daily.  . citalopram (CELEXA) 20 MG tablet Take 20 mg by mouth daily.  . cycloSPORINE (RESTASIS) 0.05 % ophthalmic emulsion Place 1 drop into both eyes 2 (two) times daily.  . diphenhydrAMINE (BENADRYL) 50 MG/ML injection Inject 25 mg into the vein once. Given once on 05/19/2017  . donepezil (ARICEPT) 10 MG tablet Take 10 mg by mouth at bedtime. For dementia  . hydrALAZINE (APRESOLINE) 50 MG tablet Take 50 mg by mouth hold for systolic B/P less than 086 give three times a day  . loratadine (CLARITIN) 10 MG tablet Take 10 mg by mouth daily.  Marland Kitchen LORazepam (ATIVAN) 0.5 MG tablet Take 1 tablet (0.5 mg total) by mouth 2 (two) times daily.  Marland Kitchen lovastatin (MEVACOR) 40 MG tablet Take 40 mg by mouth at bedtime.  . nitroGLYCERIN (NITROSTAT) 0.4 MG SL tablet Place 0.4 mg under the tongue every 5 (five) minutes as needed for chest pain.   . pantoprazole (PROTONIX) 40 MG tablet Take 40 mg by mouth daily.   . Travoprost, BAK Free, (TRAVATAN) 0.004 % SOLN ophthalmic solution Place 1 drop into both eyes at bedtime. For glaucoma  . [DISCONTINUED] benazepril (LOTENSIN) 40 MG tablet Take 40 mg by mouth daily.  . [DISCONTINUED] Docosanol (ABREVA) 10 % CREA Apply topically to blister above lip 5 times daily for 7 days. Stop date  04/28/2017   No facility-administered encounter medications on file as of 05/19/2017.     Review of Systems   In general she does not complain of any fever or chills.  Skin does not complain of rashes or itching has had the swelling is noted above.  Head ears eyes nose mouth and throat does not complaining of any difficulty swallowing or visual changes.  Resp- denies any shortness of breath or increased cough from baseline.  Cardiac does not complain of any chest pain has chronic venous stasis edema  GI does not complain of any nausea vomiting diarrhea or constipation at this time are abdominal discomfort.  Muscle skeletal does not complain of joint pain at this time at times will complain of leg pain chest suspect as an anxiety component  to it at times.  Psych does have a history of dementia and anxiety that appears to be well controlled today despite having the early episode of edema she does not appear to be overtly anxious  Immunization History  Administered Date(s) Administered  . Influenza Whole 06/11/2007, 10/16/2008, 05/21/2009, 05/05/2010  . Influenza-Unspecified 06/05/2014, 06/02/2016  . Pneumococcal Polysaccharide-23 06/11/2007  . Pneumococcal-Unspecified 06/06/2016  . Td 06/11/2007  . Zoster 05/21/2007   Pertinent  Health Maintenance Due  Topic Date Due  . INFLUENZA VACCINE  07/29/2017 (Originally 03/29/2017)  . PNA vac Low Risk Adult (2 of 2 - PCV13) 06/06/2017  . DEXA SCAN  Completed   No flowsheet data found. Functional Status Survey:    Vitals:   05/19/17 1052  BP: (!) 170/74  Pulse: 75  Resp: 18  Temp: 98.7 F (37.1 C)  TempSrc: Oral  Of note   manual recheck of blood pressure when I was in the facility was 140/58 ---Weight is 180.8--this is relatively stable actually the lower end of her recent baseline Physical Exam  In general this is a pleasant elderly somewhat obese female in no distress sitting comfortably in her wheelchair.  Her skin is warm  and dry there is no diaphoresis continues to have baseline venous stasis changes of her lower extremities I do not note any increased edema from baseline or erythema.  Eyes pupils appear reactive light sclerae and conjunctivae are clear I do not note any orbital edema.  Miouthg has some mild residual swelling of her left side lip but could not really appreciate tongue edema at this time-oropharynx is clear uvula is midline  Tongue is midline with full range of motion-his membranes are moist.--Appear she does have a slight white film on her tongue possible thrush  Chest--has very minimal expiratory wheezing somewhat diffuse this is not new there is no labored breathing no sign of distress.  Heart is regular rate and rhythm without murmur gallop or rub she appears to have her baseline venous stasis lower extremity edema.  Abdomen is obese soft nontender with positive bowel sounds.  Musculoskeletal ambulates in a wheelchair moves all extremities 4 with baseline strength she has arthritic changes of her legs which is not new.  Neurologic is grossly intact her speech is clear no lateralizing findings cranial nerves are intact.  Psych she is grossly alert and oriented has some mild cognitive deficits was pleasant and appropriate.   Labs reviewed:  Recent Labs  04/04/17 0440 04/11/17 0700 05/19/17 0800  NA 139 139 139  K 4.3 4.1 4.1  CL 104 103 102  CO2 28 27 29   GLUCOSE 75 91 85  BUN 44* 53* 37*  CREATININE 1.53* 1.43* 1.22*  CALCIUM 9.0 9.5 9.4    Recent Labs  09/27/16 0715  AST 20  ALT 15  ALKPHOS 57  BILITOT 0.5  PROT 7.1  ALBUMIN 3.9    Recent Labs  12/12/16 0800 02/17/17 0707 03/20/17 0600 05/19/17 0800  WBC 5.2 4.2 5.4 5.8  NEUTROABS 2.9 2.1  --  3.2  HGB 11.3* 11.2* 12.1 11.2*  HCT 34.8* 34.5* 37.6 34.0*  MCV 85.5 83.5 84.3 86.5  PLT 227 211 227 209   Lab Results  Component Value Date   TSH 1.454 04/10/2015   Lab Results  Component Value Date    HGBA1C 5.5 03/20/2017   Lab Results  Component Value Date   CHOL 215 (H) 02/17/2017   HDL 103 02/17/2017   LDLCALC 99 02/17/2017   TRIG  66 02/17/2017   CHOLHDL 2.1 02/17/2017    Significant Diagnostic Results in last 30 days:  No results found.  Assessment/Plan  #1-lip and tongue edema-again she has responded to Benadryl-I also did reassess her a couple times during the day including before I left the facility in the edema appeared to be essentially resolved by the time I left again it was quite mild by the time I saw her later in the morning as well.  She is not complaining of any shortness of breath chest pain or difficulty swallowing but will have to be monitored will write a when necessary order for Benadryl every 6 hours for any recurrence and certainly notify provider if this reoccurs.  I have discontinued the Lotensin-.  We'll have to keep an eye on her blood pressure of order vital signs every 4 hours 2 and then every shift to keep an eye on her status as well as blood pressure  We also ordered stat labs which have returned and which appear to be baseline white count is 5.8 hemoglobin 11.2 platelets 209-potassium 4.1 sodium 139 BUN 37 and creatinine 1.22.  #2 possible thrush-will write an order for Magic mouthwash for 3 days 4 times a day 5 mL and monitor.  #3 hypertension again we'll keep an eye on this she continues on hydralazine as well as Norvasc.--- And blood pressure was 140/58 when I took which is reasonable considering her advanced age and variable blood pressure   [id number]-of note greater than 40 minutes spent assessing patient-discussing her status with nursing staff in person and via phone-reviewing her chart-reviewing her labs-reassessing patient-and coordinating plan of care-

## 2017-05-22 ENCOUNTER — Non-Acute Institutional Stay (SKILLED_NURSING_FACILITY): Payer: Medicare Other

## 2017-05-22 DIAGNOSIS — F428 Other obsessive-compulsive disorder: Secondary | ICD-10-CM | POA: Diagnosis not present

## 2017-05-22 DIAGNOSIS — F411 Generalized anxiety disorder: Secondary | ICD-10-CM | POA: Diagnosis not present

## 2017-05-22 DIAGNOSIS — F320 Major depressive disorder, single episode, mild: Secondary | ICD-10-CM | POA: Diagnosis not present

## 2017-05-22 DIAGNOSIS — Z0000 Encounter for general adult medical examination without abnormal findings: Secondary | ICD-10-CM

## 2017-05-22 NOTE — Patient Instructions (Signed)
[name] , Thank you for taking time to come for your Medicare Wellness Visit. I appreciate your ongoing commitment to your health goals. Please review the following plan we discussed and let me know if I can assist you in the future.   Screening recommendations/referrals: Colonoscopy excluded, pt over age 81 Mammogram excluded, pt over age 73 Bone Density up to date Recommended yearly ophthalmology/optometry visit for glaucoma screening and checkup Recommended yearly dental visit for hygiene and checkup  Vaccinations: Influenza vaccine due Pneumococcal vaccine up to date Tdap vaccine up to date. Due 06/10/17 Shingles vaccine Not in records    Advanced directives: DNR in chart, copies of health care power of attorney and living will are needed   Conditions/risks identified: none  Next appointment: Dr. Lyndel Safe makes rounds   Preventive Care 65 Years and Older, Female Preventive care refers to lifestyle choices and visits with your health care provider that can promote health and wellness. What does preventive care include?  A yearly physical exam. This is also called an annual well check.  Dental exams once or twice a year.  Routine eye exams. Ask your health care provider how often you should have your eyes checked.  Personal lifestyle choices, including:  Daily care of your teeth and gums.  Regular physical activity.  Eating a healthy diet.  Avoiding tobacco and drug use.  Limiting alcohol use.  Practicing safe sex.  Taking low-dose aspirin every day.  Taking vitamin and mineral supplements as recommended by your health care provider. What happens during an annual well check? The services and screenings done by your health care provider during your annual well check will depend on your age, overall health, lifestyle risk factors, and family history of disease. Counseling  Your health care provider may ask you questions about your:  Alcohol use.  Tobacco  use.  Drug use.  Emotional well-being.  Home and relationship well-being.  Sexual activity.  Eating habits.  History of falls.  Memory and ability to understand (cognition).  Work and work Statistician.  Reproductive health. Screening  You may have the following tests or measurements:  Height, weight, and BMI.  Blood pressure.  Lipid and cholesterol levels. These may be checked every 5 years, or more frequently if you are over 1 years old.  Skin check.  Lung cancer screening. You may have this screening every year starting at age 34 if you have a 30-pack-year history of smoking and currently smoke or have quit within the past 15 years.  Fecal occult blood test (FOBT) of the stool. You may have this test every year starting at age 81.  Flexible sigmoidoscopy or colonoscopy. You may have a sigmoidoscopy every 5 years or a colonoscopy every 10 years starting at age 61.  Hepatitis C blood test.  Hepatitis B blood test.  Sexually transmitted disease (STD) testing.  Diabetes screening. This is done by checking your blood sugar (glucose) after you have not eaten for a while (fasting). You may have this done every 1-3 years.  Bone density scan. This is done to screen for osteoporosis. You may have this done starting at age 33.  Mammogram. This may be done every 1-2 years. Talk to your health care provider about how often you should have regular mammograms. Talk with your health care provider about your test results, treatment options, and if necessary, the need for more tests. Vaccines  Your health care provider may recommend certain vaccines, such as:  Influenza vaccine. This is recommended every  year.  Tetanus, diphtheria, and acellular pertussis (Tdap, Td) vaccine. You may need a Td booster every 10 years.  Zoster vaccine. You may need this after age 14.  Pneumococcal 13-valent conjugate (PCV13) vaccine. One dose is recommended after age 26.  Pneumococcal  polysaccharide (PPSV23) vaccine. One dose is recommended after age 82. Talk to your health care provider about which screenings and vaccines you need and how often you need them. This information is not intended to replace advice given to you by your health care provider. Make sure you discuss any questions you have with your health care provider. Document Released: 09/11/2015 Document Revised: 05/04/2016 Document Reviewed: 06/16/2015 Elsevier Interactive Patient Education  2017 [place] Prevention in the Home Falls can cause injuries. They can happen to people of all ages. There are many things you can do to make your home safe and to help prevent falls. What can I do on the outside of my home?  Regularly fix the edges of walkways and driveways and fix any cracks.  Remove anything that might make you trip as you walk through a door, such as a raised step or threshold.  Trim any bushes or trees on the path to your home.  Use bright outdoor lighting.  Clear any walking paths of anything that might make someone trip, such as rocks or tools.  Regularly check to see if handrails are loose or broken. Make sure that both sides of any steps have handrails.  Any raised decks and porches should have guardrails on the edges.  Have any leaves, snow, or ice cleared regularly.  Use sand or salt on walking paths during winter.  Clean up any spills in your garage right away. This includes oil or grease spills. What can I do in the bathroom?  Use night lights.  Install grab bars by the toilet and in the tub and shower. Do not use towel bars as grab bars.  Use non-skid mats or decals in the tub or shower.  If you need to sit down in the shower, use a plastic, non-slip stool.  Keep the floor dry. Clean up any water that spills on the floor as soon as it happens.  Remove soap buildup in the tub or shower regularly.  Attach bath mats securely with double-sided non-slip rug  tape.  Do not have throw rugs and other things on the floor that can make you trip. What can I do in the bedroom?  Use night lights.  Make sure that you have a light by your bed that is easy to reach.  Do not use any sheets or blankets that are too big for your bed. They should not hang down onto the floor.  Have a firm chair that has side arms. You can use this for support while you get dressed.  Do not have throw rugs and other things on the floor that can make you trip. What can I do in the kitchen?  Clean up any spills right away.  Avoid walking on wet floors.  Keep items that you use a lot in easy-to-reach places.  If you need to reach something above you, use a strong step stool that has a grab bar.  Keep electrical cords out of the way.  Do not use floor polish or wax that makes floors slippery. If you must use wax, use non-skid floor wax.  Do not have throw rugs and other things on the floor that can make you trip. What can  I do with my stairs?  Do not leave any items on the stairs.  Make sure that there are handrails on both sides of the stairs and use them. Fix handrails that are broken or loose. Make sure that handrails are as long as the stairways.  Check any carpeting to make sure that it is firmly attached to the stairs. Fix any carpet that is loose or worn.  Avoid having throw rugs at the top or bottom of the stairs. If you do have throw rugs, attach them to the floor with carpet tape.  Make sure that you have a light switch at the top of the stairs and the bottom of the stairs. If you do not have them, ask someone to add them for you. What else can I do to help prevent falls?  Wear shoes that:  Do not have high heels.  Have rubber bottoms.  Are comfortable and fit you well.  Are closed at the toe. Do not wear sandals.  If you use a stepladder:  Make sure that it is fully opened. Do not climb a closed stepladder.  Make sure that both sides of the  stepladder are locked into place.  Ask someone to hold it for you, if possible.  Clearly mark and make sure that you can see:  Any grab bars or handrails.  First and last steps.  Where the edge of each step is.  Use tools that help you move around (mobility aids) if they are needed. These include:  Canes.  Walkers.  Scooters.  Crutches.  Turn on the lights when you go into a dark area. Replace any light bulbs as soon as they burn out.  Set up your furniture so you have a clear path. Avoid moving your furniture around.  If any of your floors are uneven, fix them.  If there are any pets around you, be aware of where they are.  Review your medicines with your doctor. Some medicines can make you feel dizzy. This can increase your chance of falling. Ask your doctor what other things that you can do to help prevent falls. This information is not intended to replace advice given to you by your health care provider. Make sure you discuss any questions you have with your health care provider. Document Released: 06/11/2009 Document Revised: 01/21/2016 Document Reviewed: 09/19/2014 Elsevier Interactive Patient Education  2017 [employer].

## 2017-05-22 NOTE — Progress Notes (Signed)
Subjective:   [name] is a 83 y.o. female who presents for an Initial Medicare Annual Wellness Visit at [facility]       Objective:    Today's Vitals   05/22/17 1010  BP: 132/78  Pulse: 67  Temp: 98 F (36.7 C)  TempSrc: Oral  SpO2: 90%  Weight: 182 lb (82.6 kg)  Height: 4\' 11"  (1.499 m)  PainSc: 10-Worst pain ever   Body mass index is 36.76 kg/m.   Current Medications (verified) Outpatient Encounter Prescriptions as of 05/22/2017  Medication Sig  . acetaminophen (TYLENOL) 500 MG tablet Take 1,000 mg by mouth 2 (two) times daily. For leg pain and arthritis. May have additional 1000 mg for break through pain  . amLODipine (NORVASC) 5 MG tablet Take 5 mg by mouth daily.  . Brinzolamide-Brimonidine (SIMBRINZA) 1-0.2 % SUSP Apply to eye. One  drop into both eyes three times a day  . Calcium Carbonate-Vitamin D (CALCIUM-VITAMIN D) 500-200 MG-UNIT tablet Take 1 tablet by mouth 2 (two) times daily.  . citalopram (CELEXA) 20 MG tablet Take 20 mg by mouth daily.  . cycloSPORINE (RESTASIS) 0.05 % ophthalmic emulsion Place 1 drop into both eyes 2 (two) times daily.  . diphenhydrAMINE (BENADRYL) 50 MG/ML injection Inject 25 mg into the vein once. Given once on 05/19/2017  . donepezil (ARICEPT) 10 MG tablet Take 10 mg by mouth at bedtime. For dementia  . hydrALAZINE (APRESOLINE) 50 MG tablet Take 50 mg by mouth hold for systolic B/P less than 132 give three times a day  . loratadine (CLARITIN) 10 MG tablet Take 10 mg by mouth daily.  Marland Kitchen LORazepam (ATIVAN) 0.5 MG tablet Take 1 tablet (0.5 mg total) by mouth 2 (two) times daily.  Marland Kitchen lovastatin (MEVACOR) 40 MG tablet Take 40 mg by mouth at bedtime.  . nitroGLYCERIN (NITROSTAT) 0.4 MG SL tablet Place 0.4 mg under the tongue every 5 (five) minutes as needed for chest pain.   . pantoprazole (PROTONIX) 40 MG tablet Take 40 mg by mouth daily.   . Travoprost, BAK Free, (TRAVATAN) 0.004 % SOLN ophthalmic solution  Place 1 drop into both eyes at bedtime. For glaucoma   No facility-administered encounter medications on file as of 05/22/2017.     Allergies (verified) Bee venom; Aspirin; Cabbage; and Latex   History: Past Medical History:  Diagnosis Date  . Anxiety   . Cellulitis 2016  . CHF (congestive heart failure) ([place])   . Chronic kidney disease   . Dementia   . Dysphagia   . Gait abnormality   . GERD (gastroesophageal reflux disease)   . Glaucoma   . Gout   . Hyperlipidemia   . Hypertension   . Muscle weakness   . Myocardial infarction [facility]) [place]  . Osteoarthritis   . Syncope    Past Surgical History:  Procedure Laterality Date  . APPENDECTOMY    . CATARACT EXTRACTION W/PHACO  08/04/2011   Procedure: CATARACT EXTRACTION PHACO AND INTRAOCULAR LENS PLACEMENT (IOC);  Surgeon: Tonny Branch;  Location: [facility];  Service: Ophthalmology;  Laterality: Right;  CDE=18.53  . CATARACT EXTRACTION W/PHACO  08/25/2011   Procedure: CATARACT EXTRACTION PHACO AND INTRAOCULAR LENS PLACEMENT (IOC);  Surgeon: Tonny Branch;  Location: [facility];  Service: Ophthalmology;  Laterality: Left;  CDE:14.76  . TONSILLECTOMY     Family History  Problem Relation Age of Onset  . Diabetes Father   . Diabetes Sister   .  Diabetes Brother   . Stroke Sister   . Stroke Sister   . Anesthesia problems Neg Hx   . Hypotension Neg Hx   . Malignant hyperthermia Neg Hx   . Pseudochol deficiency Neg Hx    Social History   Occupational History  . retired     Social History Main Topics  . Smoking status: Never Smoker  . Smokeless tobacco: Never Used  . Alcohol use No  . Drug use: No  . Sexual activity: No    Tobacco Counseling Counseling given: Not Answered   Activities of Daily Living In your present state of health, do you have any difficulty performing the following activities: 05/22/2017  Hearing? Y  Vision? Y  Difficulty concentrating or making decisions? Y  Walking or climbing stairs? Y    Dressing or bathing? N  Doing errands, shopping? Y  Preparing Food and eating ? Y  Using the Toilet? Y  In the past six months, have you accidently leaked urine? Y  Do you have problems with loss of bowel control? N  Managing your Medications? Y  Managing your Finances? Y  Housekeeping or managing your Housekeeping? Y  Some recent data might be hidden    Immunizations and Health Maintenance Immunization History  Administered Date(s) Administered  . Influenza Whole 06/11/2007, 10/16/2008, 05/21/2009, 05/05/2010  . Influenza-Unspecified 06/05/2014, 06/02/2016  . Pneumococcal Polysaccharide-23 06/11/2007  . Pneumococcal-Unspecified 06/06/2016  . Td 06/11/2007  . Zoster 05/21/2007   There are no preventive care reminders to display for this patient.  Patient Care Team: [name] as PCP - General (Internal Medicine) [name], MD as Consulting Physician (Geriatric Medicine)  Indicate any recent Medical Services you may have received from other than [facility] providers in the past year (date may be approximate).     Assessment:   This is a routine wellness examination for [name].   Hearing/Vision screen No exam data present  Dietary issues and exercise activities discussed: Current Exercise Habits: The patient does not participate in regular exercise at present, Exercise limited by: orthopedic condition(s)  Goals    None     Depression Screen PHQ 2/9 Scores 05/22/2017  PHQ - 2 Score 0    Fall Risk Fall Risk  05/22/2017  Falls in the past year? No    Cognitive Function:     6CIT Screen 05/22/2017  What Year? 0 points  What month? 0 points  What time? 0 points  Count back from 20 0 points  Months in reverse 0 points  Repeat phrase 6 points  Total Score 6    Screening Tests Health Maintenance  Topic Date Due  . INFLUENZA VACCINE  07/29/2017 (Originally 03/29/2017)  . PNA vac Low Risk Adult (2 of 2 - PCV13) 06/06/2017  . TETANUS/TDAP  06/10/2017   . DEXA SCAN  Completed      Plan:  I have personally reviewed and addressed the Medicare Annual Wellness questionnaire and have noted the following in the patient's chart:  A. Medical and social history B. Use of alcohol, tobacco or illicit drugs  C. Current medications and supplements D. Functional ability and status E.  Nutritional status F.  Physical activity G. Advance directives H. List of other physicians I.  Hospitalizations, surgeries, and ER visits in previous 12 months J.  [place] to include hearing, vision, cognitive, depression L. Referrals and appointments - none  In addition, I have reviewed and discussed with patient certain preventive protocols, quality metrics, and  best practice recommendations. A written personalized care plan for preventive services as well as general preventive health recommendations were provided to patient.  See attached scanned questionnaire for additional information.   Signed,   Rich Reining, RN Nurse Health Advisor   Quick Notes   Health Maintenance: Flu vaccine due     Abnormal Screen: None     Patient Concerns: None     Nurse Concerns: None

## 2017-05-25 ENCOUNTER — Encounter: Payer: Self-pay | Admitting: Internal Medicine

## 2017-05-25 ENCOUNTER — Non-Acute Institutional Stay (SKILLED_NURSING_FACILITY): Payer: Medicare Other | Admitting: Internal Medicine

## 2017-05-25 DIAGNOSIS — I10 Essential (primary) hypertension: Secondary | ICD-10-CM | POA: Diagnosis not present

## 2017-05-25 DIAGNOSIS — N183 Chronic kidney disease, stage 3 unspecified: Secondary | ICD-10-CM

## 2017-05-25 DIAGNOSIS — I5032 Chronic diastolic (congestive) heart failure: Secondary | ICD-10-CM

## 2017-05-25 DIAGNOSIS — F329 Major depressive disorder, single episode, unspecified: Secondary | ICD-10-CM | POA: Diagnosis not present

## 2017-05-25 DIAGNOSIS — F32A Depression, unspecified: Secondary | ICD-10-CM

## 2017-05-25 DIAGNOSIS — R350 Frequency of micturition: Secondary | ICD-10-CM | POA: Diagnosis not present

## 2017-05-25 NOTE — Progress Notes (Signed)
Location:   [place] Room Number: 122/D Place of Service:  SNF (31) Provider:  Malen Gauze, Anthoney Harada, PA-C  Patient Care Team: [name] as PCP - General (Internal Medicine) [name], MD as Consulting Physician (Geriatric Medicine)  Extended Emergency Contact Information Primary Emergency Contact: [name], [place] [address] [name] of [place] Phone: [phone] Relation: Daughter Secondary Emergency Contact: [name] States of [place] Phone: [phone] Relation: Son  Code Status:  DNR Goals of care: Advanced Directive information Advanced Directives 05/25/2017  Does Patient Have a Medical Advance Directive? Yes  Type of Advance Directive Out of facility DNR (pink MOST or yellow form)  Does patient want to make changes to medical advance directive? No - Patient declined  Copy of [place] in Chart? -  Pre-existing out of facility DNR order (yellow form or pink MOST form) -     Chief Complaint  Patient presents with  . Medical Management of Chronic Issues    Routine Visit    HPI:  Pt is a 83 y.o. female seen today for medical management of chronic diseases.    Patient has H/o Hypertension, Distal DVT, Chronic venous stasis, Diastolic CHF chronic Renal disaese and GERD ,Glaucoma, Dementia and Anxiety  Patient was seen today for Routine visit. Since my last visit patient had 1 episode of Tongue swelling and her Angiotensin was discontinued and she has done well since then. Her BP always have been problem to control. Her systolic varies from 527-782. She also had c/o feeling weak as her BP was low at 101.  She denied any dizziness or chest pain. She also was c/o increased frequency of urination. Says she has been going to bathroom every hour recently. Denies drinking sugary drink. Denies any Dysuria.  Otherwise her appetite is good. Mood seems better  nowadays.Her weight is stable from 182-184 lbs. No New Nursing issues.  Past Medical History:  Diagnosis Date  . Anxiety   . Cellulitis 2016  . CHF (congestive heart failure) ([place])   . Chronic kidney disease   . Dementia   . Dysphagia   . Gait abnormality   . GERD (gastroesophageal reflux disease)   . Glaucoma   . Gout   . Hyperlipidemia   . Hypertension   . Muscle weakness   . Myocardial infarction [facility]) [place]  . Osteoarthritis   . Syncope    Past Surgical History:  Procedure Laterality Date  . APPENDECTOMY    . CATARACT EXTRACTION W/PHACO  08/04/2011   Procedure: CATARACT EXTRACTION PHACO AND INTRAOCULAR LENS PLACEMENT (IOC);  Surgeon: Tonny Branch;  Location: [facility];  Service: Ophthalmology;  Laterality: Right;  CDE=18.53  . CATARACT EXTRACTION W/PHACO  08/25/2011   Procedure: CATARACT EXTRACTION PHACO AND INTRAOCULAR LENS PLACEMENT (IOC);  Surgeon: Tonny Branch;  Location: [facility];  Service: Ophthalmology;  Laterality: Left;  CDE:14.76  . TONSILLECTOMY      Allergies  Allergen Reactions  . Bee Venom Anaphylaxis  . Angiotensin Receptor Blockers     Tongue swelling.  . Aspirin     Directed by physician  . Cabbage Other (See Comments)    Certain foods due to gout flares  . Latex Rash    Outpatient Encounter Prescriptions as of 05/25/2017  Medication Sig  . acetaminophen (TYLENOL) 500 MG tablet Take 1,000 mg by mouth 2 (two) times daily. For leg  pain and arthritis. May have additional 1000 mg for break through pain  . amLODipine (NORVASC) 5 MG tablet Take 5 mg by mouth daily.  . Brinzolamide-Brimonidine (SIMBRINZA) 1-0.2 % SUSP Apply to eye. One  drop into both eyes three times a day  . Calcium Carbonate-Vitamin D (CALCIUM-VITAMIN D) 500-200 MG-UNIT tablet Take 1 tablet by mouth 2 (two) times daily.  . citalopram (CELEXA) 20 MG tablet Take 20 mg by mouth daily.  . cycloSPORINE (RESTASIS) 0.05 % ophthalmic emulsion Place 1 drop into both eyes 2 (two) times daily.   . diphenhydrAMINE (BENADRYL) 25 mg capsule Take 25 mg by mouth every 6 (six) hours as needed.  . donepezil (ARICEPT) 10 MG tablet Take 10 mg by mouth at bedtime. For dementia  . hydrALAZINE (APRESOLINE) 50 MG tablet Take 50 mg by mouth hold for systolic B/P less than 782 give three times a day  . loratadine (CLARITIN) 10 MG tablet Take 10 mg by mouth daily.  Marland Kitchen LORazepam (ATIVAN) 0.5 MG tablet Take 1 tablet (0.5 mg total) by mouth 2 (two) times daily.  Marland Kitchen lovastatin (MEVACOR) 40 MG tablet Take 40 mg by mouth at bedtime.  . nitroGLYCERIN (NITROSTAT) 0.4 MG SL tablet Place 0.4 mg under the tongue every 5 (five) minutes as needed for chest pain.   . pantoprazole (PROTONIX) 40 MG tablet Take 40 mg by mouth daily.   . Travoprost, BAK Free, (TRAVATAN) 0.004 % SOLN ophthalmic solution Place 1 drop into both eyes at bedtime. For glaucoma  . [DISCONTINUED] diphenhydrAMINE (BENADRYL) 50 MG/ML injection Inject 25 mg into the vein once. Given once on 05/19/2017   No facility-administered encounter medications on file as of 05/25/2017.      Review of Systems  Review of Systems  Constitutional: Negative for activity change, appetite change, chills, diaphoresis, fatigue and fever.  HENT: Negative for mouth sores, postnasal drip, rhinorrhea, sinus pain and sore throat.   Respiratory: Negative for apnea, cough, chest tightness, shortness of breath and wheezing.   Cardiovascular: Negative for chest pain, palpitations and leg swelling.  Gastrointestinal: Negative for abdominal distention, abdominal pain, constipation, diarrhea, nausea and vomiting.  Genitourinary: Negative for dysuria  Musculoskeletal: Negative for arthralgias, joint swelling and myalgias.  Skin: Negative for rash.  Neurological: Negative for dizziness, syncope,  light-headedness and numbness.  Psychiatric/Behavioral: Negative for behavioral problems, confusion and sleep disturbance.     Immunization History  Administered Date(s)  Administered  . Influenza Whole 06/11/2007, 10/16/2008, 05/21/2009, 05/05/2010  . Influenza-Unspecified 06/05/2014, 06/02/2016  . Pneumococcal Polysaccharide-23 06/11/2007  . Pneumococcal-Unspecified 06/06/2016  . Td 06/11/2007  . Zoster 05/21/2007   Pertinent  Health Maintenance Due  Topic Date Due  . INFLUENZA VACCINE  07/29/2017 (Originally 03/29/2017)  . PNA vac Low Risk Adult (2 of 2 - PCV13) 06/06/2017  . DEXA SCAN  Completed   Fall Risk  05/22/2017  Falls in the past year? No   Functional Status Survey:    Vitals:   05/25/17 1112  BP: 101/61  Pulse: 76  Resp: 20  Temp: 98.7 F (37.1 C)  TempSrc: Oral  SpO2: 98%  Weight: 184 lb 3.2 oz (83.6 kg)  Height: 4\' 11"  (1.499 m)   Body mass index is 37.2 kg/m. Physical Exam  Constitutional: She is oriented to person, place, and time. She appears well-developed and well-nourished.  HENT:  Head: Normocephalic.  Mouth/Throat: Oropharynx is clear and moist.  Eyes: Pupils are equal, round, and reactive to light.  Neck: Neck supple.  Cardiovascular: Normal rate  and normal heart sounds.   Pulmonary/Chest: Effort normal and breath sounds normal. No respiratory distress. She has no wheezes. She has no rales.  Abdominal: Soft. Bowel sounds are normal. She exhibits no distension. There is no tenderness. There is no rebound.  Musculoskeletal:  Chronic Changes bilateral  Neurological: She is alert and oriented to person, place, and time.  Skin: Skin is warm and dry.  Psychiatric: She has a normal mood and affect. Her behavior is normal. Thought content normal.    Labs reviewed:  Recent Labs  04/04/17 0440 04/11/17 0700 05/19/17 0800  NA 139 139 139  K 4.3 4.1 4.1  CL 104 103 102  CO2 28 27 29   GLUCOSE 75 91 85  BUN 44* 53* 37*  CREATININE 1.53* 1.43* 1.22*  CALCIUM 9.0 9.5 9.4    Recent Labs  09/27/16 0715  AST 20  ALT 15  ALKPHOS 57  BILITOT 0.5  PROT 7.1  ALBUMIN 3.9    Recent Labs  12/12/16 0800  02/17/17 0707 03/20/17 0600 05/19/17 0800  WBC 5.2 4.2 5.4 5.8  NEUTROABS 2.9 2.1  --  3.2  HGB 11.3* 11.2* 12.1 11.2*  HCT 34.8* 34.5* 37.6 34.0*  MCV 85.5 83.5 84.3 86.5  PLT 227 211 227 209   Lab Results  Component Value Date   TSH 1.454 04/10/2015   Lab Results  Component Value Date   HGBA1C 5.5 03/20/2017   Lab Results  Component Value Date   CHOL 215 (H) 02/17/2017   HDL 103 02/17/2017   LDLCALC 99 02/17/2017   TRIG 66 02/17/2017   CHOLHDL 2.1 02/17/2017    Significant Diagnostic Results in last 30 days:  No results found.  Assessment/Plan Essential hypertension Patient BP was low today and per Nurses they have been holding her hydralazine most of the time. Will decrease the Hydralazine to 25 mg TID. Continue to monitor BP. Urinary frequency Will Check UA  Chronic diastolic congestive heart failure  Weight stable No Swelling in her legs Off her lasix. Patet does not want to restart Continue to folow  Stage 3 chronic kidney disease  Creat staying Stable  Depression With Anxiety Patient has been doing well recently. She is on Celexa and Ativan  Also Continue Aricept. Hyperlipidemia LDL 99 on 06/18 On Lovastatin  GERD On Protonix   Family/ staff Communication:   Labs/tests ordered:  UA  Total time spent in this patient care encounter was 25_ minutes; greater than 50% of the visit spent counseling patient, reviewing records , Labs and coordinating care for problems addressed at this encounter.

## 2017-05-26 ENCOUNTER — Encounter (HOSPITAL_COMMUNITY): Admission: RE | Admit: 2017-05-26 | Payer: Medicare Other | Source: Skilled Nursing Facility | Admitting: *Deleted

## 2017-05-27 ENCOUNTER — Encounter (HOSPITAL_COMMUNITY)
Admission: RE | Admit: 2017-05-27 | Discharge: 2017-05-27 | Disposition: A | Discharge status: Home / Self Care | Payer: Medicare Other | Source: Other Acute Inpatient Hospital | Attending: Internal Medicine | Admitting: Internal Medicine

## 2017-05-27 DIAGNOSIS — N189 Chronic kidney disease, unspecified: Secondary | ICD-10-CM | POA: Diagnosis not present

## 2017-05-27 LAB — URINALYSIS, ROUTINE W REFLEX MICROSCOPIC
Bilirubin Urine: NEGATIVE
GLUCOSE, UA: NEGATIVE mg/dL
Hgb urine dipstick: NEGATIVE
Ketones, ur: NEGATIVE mg/dL
NITRITE: POSITIVE — AB
PH: 5 (ref 5.0–8.0)
Protein, ur: NEGATIVE mg/dL
SPECIFIC GRAVITY, URINE: 1.018 (ref 1.005–1.030)

## 2017-05-29 ENCOUNTER — Non-Acute Institutional Stay (SKILLED_NURSING_FACILITY): Payer: Medicare Other | Admitting: Internal Medicine

## 2017-05-29 ENCOUNTER — Encounter: Payer: Self-pay | Admitting: Internal Medicine

## 2017-05-29 DIAGNOSIS — F428 Other obsessive-compulsive disorder: Secondary | ICD-10-CM | POA: Diagnosis not present

## 2017-05-29 DIAGNOSIS — N3000 Acute cystitis without hematuria: Secondary | ICD-10-CM | POA: Diagnosis not present

## 2017-05-29 DIAGNOSIS — F4310 Post-traumatic stress disorder, unspecified: Secondary | ICD-10-CM | POA: Diagnosis not present

## 2017-05-29 DIAGNOSIS — I10 Essential (primary) hypertension: Secondary | ICD-10-CM | POA: Diagnosis not present

## 2017-05-29 DIAGNOSIS — F320 Major depressive disorder, single episode, mild: Secondary | ICD-10-CM | POA: Diagnosis not present

## 2017-05-29 DIAGNOSIS — F411 Generalized anxiety disorder: Secondary | ICD-10-CM | POA: Diagnosis not present

## 2017-05-29 LAB — URINE CULTURE: Culture: >=100000 — AB

## 2017-05-29 NOTE — Progress Notes (Signed)
Location:   [place] Room Number: 122/D Place of Service:  SNF (31) Provider:  Malen Gauze, Anthoney Harada, PA-C  Patient Care Team: [name] as PCP - General (Internal Medicine) [name] Dad, MD as Consulting Physician (Geriatric Medicine)  Extended Emergency Contact Information Primary Emergency Contact: [name] [name], [place] [address] [name] [name] of [place] Phone: [phone] Relation: Daughter Secondary Emergency Contact: [name] [name] [place] of [place] Phone: [phone] Relation: Son  Code Status:  DNR Goals of care: Advanced Directive information Advanced Directives 05/29/2017  Does Patient Have a Medical Advance Directive? Yes  Type of Advance Directive Out of facility DNR (pink MOST or yellow form)  Does patient want to make changes to medical advance directive? No - Patient declined  Copy of [place] in Chart? -  Pre-existing out of facility DNR order (yellow form or pink MOST form) -     Chief Complaint  Patient presents with  . Acute Visit     F/U UTI    HPI:  Pt is a 83 y.o. [name] seen today for an acute visit for treatment of UTI  Patient has H/o Hypertension, Distal DVT, Chronic venous stasis, Diastolic CHF chronic Renal disaese and GERD Glaucoma Dementia and Anxiety I had seen patient for Routine visit few days ago and she was c/o urinary frequency. The Urine Culture is now growing more then 100,000 colonies of E Coli sensitive to Cephflosporin.  Patient continues to deny Any Fever , Chills or dysuria. She does continue to have frequency and urgency. Her BP also was elevated this morning. I had reduced her Hydralazine to 25 mg BID as he systolic was 119 when I saw her. And she was saying she feels weak.   Past Medical History:  Diagnosis Date  . Anxiety   . Cellulitis 2016  . CHF (congestive heart failure) ([place])   . Chronic kidney disease   .  Dementia   . Dysphagia   . Gait abnormality   . GERD (gastroesophageal reflux disease)   . Glaucoma   . Gout   . Hyperlipidemia   . Hypertension   . Muscle weakness   . Myocardial infarction [facility]) [place]  . Osteoarthritis   . Syncope    Past Surgical History:  Procedure Laterality Date  . APPENDECTOMY    . CATARACT EXTRACTION W/PHACO  08/04/2011   Procedure: CATARACT EXTRACTION PHACO AND INTRAOCULAR LENS PLACEMENT (IOC);  Surgeon: Tonny Branch;  Location: [facility];  Service: Ophthalmology;  Laterality: Right;  CDE=18.53  . CATARACT EXTRACTION W/PHACO  08/25/2011   Procedure: CATARACT EXTRACTION PHACO AND INTRAOCULAR LENS PLACEMENT (IOC);  Surgeon: Tonny Branch;  Location: [facility];  Service: Ophthalmology;  Laterality: Left;  CDE:14.76  . TONSILLECTOMY      Allergies  Allergen Reactions  . Bee Venom Anaphylaxis  . Angiotensin Receptor Blockers     Tongue swelling.  . Aspirin     Directed by physician  . Cabbage Other (See Comments)    Certain foods due to gout flares  . Latex Rash    Outpatient Encounter Prescriptions as of 05/29/2017  Medication Sig  . acetaminophen (TYLENOL) 500 MG tablet Take 1,000 mg by mouth 2 (two) times daily. For leg pain and arthritis. May have additional 1000 mg for break through pain  . amLODipine (NORVASC) 5 MG tablet Take 5 mg by mouth daily.  . Brinzolamide-Brimonidine (  SIMBRINZA) 1-0.2 % SUSP Apply to eye. One  drop into both eyes three times a day  . Calcium Carbonate-Vitamin D (CALCIUM-VITAMIN D) 500-200 MG-UNIT tablet Take 1 tablet by mouth 2 (two) times daily.  . citalopram (CELEXA) 20 MG tablet Take 20 mg by mouth daily.  . cycloSPORINE (RESTASIS) 0.05 % ophthalmic emulsion Place 1 drop into both eyes 2 (two) times daily.  . diphenhydrAMINE (BENADRYL) 25 mg capsule Take 25 mg by mouth every 6 (six) hours as needed.  . donepezil (ARICEPT) 10 MG tablet Take 10 mg by mouth at bedtime. For dementia  . hydrALAZINE (APRESOLINE) 25 MG tablet  Take 25 mg by mouth 2 (two) times daily.  Marland Kitchen loratadine (CLARITIN) 10 MG tablet Take 10 mg by mouth daily.  Marland Kitchen LORazepam (ATIVAN) 0.5 MG tablet Take 1 tablet (0.5 mg total) by mouth 2 (two) times daily.  Marland Kitchen lovastatin (MEVACOR) 40 MG tablet Take 40 mg by mouth at bedtime.  . nitroGLYCERIN (NITROSTAT) 0.4 MG SL tablet Place 0.4 mg under the tongue every 5 (five) minutes as needed for chest pain.   . pantoprazole (PROTONIX) 40 MG tablet Take 40 mg by mouth daily.   . Travoprost, BAK Free, (TRAVATAN) 0.004 % SOLN ophthalmic solution Place 1 drop into both eyes at bedtime. For glaucoma  . [DISCONTINUED] hydrALAZINE (APRESOLINE) 50 MG tablet Take 25 mg by mouth hold for systolic B/P less than 924 give three times a day   No facility-administered encounter medications on file as of 05/29/2017.      Review of Systems  Genitourinary: Positive for frequency and urgency.  All other systems reviewed and are negative.   Immunization History  Administered Date(s) Administered  . Influenza Whole 06/11/2007, 10/16/2008, 05/21/2009, 05/05/2010  . Influenza-Unspecified 06/05/2014, 06/02/2016  . Pneumococcal Polysaccharide-23 06/11/2007  . Pneumococcal-Unspecified 06/06/2016  . Td 06/11/2007  . Zoster 05/21/2007   Pertinent  Health Maintenance Due  Topic Date Due  . INFLUENZA VACCINE  07/29/2017 (Originally 03/29/2017)  . PNA vac Low Risk Adult (2 of 2 - PCV13) 06/06/2017  . DEXA SCAN  Completed   Fall Risk  05/22/2017  Falls in the past year? No   Functional Status Survey:    Vitals:   05/29/17 1107  BP: (!) 169/87 repeat 140/58  Pulse: 69  Resp: 20  Temp: 98.8 F (37.1 C)  TempSrc: Oral  SpO2: 97%   There is no height or weight on file to calculate BMI. Physical Exam  Constitutional: She appears well-developed and well-nourished.  HENT:  Head: Normocephalic.  Mouth/Throat: Oropharynx is clear and moist.  Eyes: Pupils are equal, round, and reactive to light.  Neck: Neck supple.    Cardiovascular: Normal rate and normal heart sounds.   Pulmonary/Chest: Effort normal and breath sounds normal. No respiratory distress. She has no wheezes. She has no rales.  Abdominal: Soft. Bowel sounds are normal. She exhibits no distension. There is no tenderness. There is no rebound.  Musculoskeletal: She exhibits edema.  Neurological: She is alert.  Skin: Skin is warm and dry.    Labs reviewed:  Recent Labs  04/04/17 0440 04/11/17 0700 05/19/17 0800  NA 139 139 139  K 4.3 4.1 4.1  CL 104 103 102  CO2 28 27 29   GLUCOSE 75 91 85  BUN 44* 53* 37*  CREATININE 1.53* 1.43* 1.22*  CALCIUM 9.0 9.5 9.4    Recent Labs  09/27/16 0715  AST 20  ALT 15  ALKPHOS 57  BILITOT 0.5  PROT 7.1  ALBUMIN 3.9    Recent Labs  12/12/16 0800 02/17/17 0707 03/20/17 0600 05/19/17 0800  WBC 5.2 4.2 5.4 5.8  NEUTROABS 2.9 2.1  --  3.2  HGB 11.3* 11.2* 12.1 11.2*  HCT 34.8* 34.5* 37.6 34.0*  MCV 85.5 [name].5 84.3 86.5  PLT 227 211 227 209   Lab Results  Component Value Date   TSH 1.454 04/10/2015   Lab Results  Component Value Date   HGBA1C 5.5 03/20/2017   Lab Results  Component Value Date   CHOL 215 (H) 02/17/2017   HDL 103 02/17/2017   LDLCALC 99 02/17/2017   TRIG 66 02/17/2017   CHOLHDL 2.1 02/17/2017    Significant Diagnostic Results in last 30 days:  No results found.  Assessment/Plan  UTI  Will start her on Ceftin for 7 days. Encourage PO fluids  Hypertension Will start her on Low Dose Norvasc to help Control Her BP.  Family/ staff Communication:   Labs/tests ordered:   Total time spent in this patient care encounter was 25_ minutes; greater than 50% of the visit spent counseling patient, reviewing records , Labs and coordinating care for problems addressed at this encounter.

## 2017-06-05 ENCOUNTER — Encounter (HOSPITAL_COMMUNITY)
Admission: RE | Admit: 2017-06-05 | Discharge: 2017-06-05 | Disposition: A | Discharge status: Home / Self Care | Payer: Medicare Other | Source: Skilled Nursing Facility | Attending: Internal Medicine | Admitting: Internal Medicine

## 2017-06-05 ENCOUNTER — Encounter: Payer: Self-pay | Admitting: Internal Medicine

## 2017-06-05 ENCOUNTER — Non-Acute Institutional Stay (SKILLED_NURSING_FACILITY): Payer: Medicare Other | Admitting: Internal Medicine

## 2017-06-05 DIAGNOSIS — R609 Edema, unspecified: Secondary | ICD-10-CM | POA: Diagnosis not present

## 2017-06-05 DIAGNOSIS — F411 Generalized anxiety disorder: Secondary | ICD-10-CM | POA: Diagnosis not present

## 2017-06-05 DIAGNOSIS — F339 Major depressive disorder, recurrent, unspecified: Secondary | ICD-10-CM | POA: Insufficient documentation

## 2017-06-05 DIAGNOSIS — I509 Heart failure, unspecified: Secondary | ICD-10-CM | POA: Diagnosis not present

## 2017-06-05 DIAGNOSIS — F428 Other obsessive-compulsive disorder: Secondary | ICD-10-CM | POA: Diagnosis not present

## 2017-06-05 DIAGNOSIS — K210 Gastro-esophageal reflux disease with esophagitis: Secondary | ICD-10-CM | POA: Diagnosis not present

## 2017-06-05 DIAGNOSIS — R7989 Other specified abnormal findings of blood chemistry: Secondary | ICD-10-CM | POA: Diagnosis not present

## 2017-06-05 DIAGNOSIS — F320 Major depressive disorder, single episode, mild: Secondary | ICD-10-CM | POA: Diagnosis not present

## 2017-06-05 DIAGNOSIS — F419 Anxiety disorder, unspecified: Secondary | ICD-10-CM | POA: Insufficient documentation

## 2017-06-05 DIAGNOSIS — R0902 Hypoxemia: Secondary | ICD-10-CM

## 2017-06-05 LAB — D-DIMER, QUANTITATIVE: D-Dimer, Quant: 0.83 ug{FEU}/mL — ABNORMAL HIGH (ref 0.00–0.50)

## 2017-06-05 LAB — CBC WITH DIFFERENTIAL/PLATELET
BASOS PCT: 0 %
Basophils Absolute: 0 10*3/uL (ref 0.0–0.1)
EOS ABS: 0.2 10*3/uL (ref 0.0–0.7)
Eosinophils Relative: 3 %
HCT: 33.7 % — ABNORMAL LOW (ref 36.0–46.0)
HEMOGLOBIN: 10.8 g/dL — AB (ref 12.0–15.0)
Lymphocytes Relative: 23 %
Lymphs Abs: 1.1 10*3/uL (ref 0.7–4.0)
MCH: 28 pg (ref 26.0–34.0)
MCHC: 32 g/dL (ref 30.0–36.0)
MCV: 87.3 fL (ref 78.0–100.0)
MONOS PCT: 17 %
Monocytes Absolute: 0.8 10*3/uL (ref 0.1–1.0)
NEUTROS PCT: 57 %
Neutro Abs: 2.7 10*3/uL (ref 1.7–7.7)
Platelets: 234 10*3/uL (ref 150–400)
RBC: 3.86 MIL/uL — ABNORMAL LOW (ref 3.87–5.11)
RDW: 15.6 % — ABNORMAL HIGH (ref 11.5–15.5)
WBC: 4.8 10*3/uL (ref 4.0–10.5)

## 2017-06-05 LAB — BASIC METABOLIC PANEL
Anion gap: 10 (ref 5–15)
BUN: 26 mg/dL — ABNORMAL HIGH (ref 6–20)
CALCIUM: 9.1 mg/dL (ref 8.9–10.3)
CHLORIDE: 102 mmol/L (ref 101–111)
CO2: 28 mmol/L (ref 22–32)
CREATININE: 1.15 mg/dL — AB (ref 0.44–1.00)
GFR calc non Af Amer: 40 mL/min — ABNORMAL LOW (ref >60)
GFR, EST AFRICAN AMERICAN: 46 mL/min — AB (ref >60)
Glucose, Bld: 87 mg/dL (ref 65–99)
Potassium: 4.1 mmol/L (ref 3.5–5.1)
Sodium: 140 mmol/L (ref 135–145)

## 2017-06-05 NOTE — Progress Notes (Signed)
Location:   [facility] Room Number: 122/D Place of Service:  SNF [phone]) Provider:  Mickel Duhamel, PA-C  Patient Care Team: [name] as PCP - General (Internal Medicine) [name], MD as Consulting Physician (Geriatric Medicine)  Extended Emergency Contact Information Primary Emergency Contact: [name], [place] [address] [name] of [place] Phone: [phone] Relation: Daughter Secondary Emergency Contact: [name] States of [place] Phone: [phone] Relation: Son  Code Status:  DNR Goals of care: Advanced Directive information Advanced Directives 06/05/2017  Does Patient Have a Medical Advance Directive? Yes  Type of Advance Directive Out of facility DNR (pink MOST or yellow form)  Does patient want to make changes to medical advance directive? No - Patient declined  Copy of [place] in Chart? -  Pre-existing out of facility DNR order (yellow form or pink MOST form) -     Chief complaint-acute visit follow hypoxic reading  HPI:  Pt is a 83 y.o. female seen today for an acute visit follow-up prostate oxygen saturation reading.  Apparently last night routine oxygen saturation was taken which was in the low 80s this quickly increased to over 90% when oxygen was applied.  She was not in any distress she denies any increase shortness of breath or cough beyond baseline.  She is comfortably sitting in her wheelchair today continues on oxygen.  She did recently complete a course of Ceftin for an Escherichia coli UTI she is not complaining of dysuria today.  She does have a history of hypertension distal DVT chronic venous stasis diastolic CHF renal disease GERD and glaucoma as well as dementia and anxiety.  Clinically has been quite stable at one point had been on  Eliquis for a possible distal femoral DVT-at that time a Doppler showed a small amount of age  indeterminate possibly nonocclusive DVT in the distal left femoral vein-updated Doppler actually did not show a DVT  after she did complete a course of Eliquis   Chest x-ray has been ordered which is pending  We have also ordered labs which is come back fairly unremarkable except d-dimer is elevated at 0.83           Past Medical History:  Diagnosis Date  . Anxiety   . Cellulitis 2016  . CHF (congestive heart failure) ([place])   . Chronic kidney disease   . Dementia   . Dysphagia   . Gait abnormality   . GERD (gastroesophageal reflux disease)   . Glaucoma   . Gout   . Hyperlipidemia   . Hypertension   . Muscle weakness   . Myocardial infarction [facility]) [place]  . Osteoarthritis   . Syncope    Past Surgical History:  Procedure Laterality Date  . APPENDECTOMY    . CATARACT EXTRACTION W/PHACO  08/04/2011   Procedure: CATARACT EXTRACTION PHACO AND INTRAOCULAR LENS PLACEMENT (IOC);  Surgeon: Tonny Branch;  Location: [facility];  Service: Ophthalmology;  Laterality: Right;  CDE=18.53  . CATARACT EXTRACTION W/PHACO  08/25/2011   Procedure: CATARACT EXTRACTION PHACO AND INTRAOCULAR LENS PLACEMENT (IOC);  Surgeon: Tonny Branch;  Location: [facility];  Service: Ophthalmology;  Laterality: Left;  CDE:14.76  . TONSILLECTOMY      Allergies  Allergen Reactions  . Bee Venom Anaphylaxis  . Angiotensin Receptor Blockers     Tongue swelling.  . Aspirin  Directed by physician  . Cabbage Other (See Comments)    Certain foods due to gout flares  . Latex Rash    Outpatient Encounter Prescriptions as of 06/05/2017  Medication Sig  . acetaminophen (TYLENOL) 500 MG tablet Take 1,000 mg by mouth 2 (two) times daily. For leg pain and arthritis. May have additional 1000 mg for break through pain  . amLODipine (NORVASC) 2.5 MG tablet Take 2.5 mg by mouth daily.  . Brinzolamide-Brimonidine (SIMBRINZA) 1-0.2 % SUSP Apply to eye. One  drop into both eyes three times a day  . Calcium  Carbonate-Vitamin D (CALCIUM-VITAMIN D) 500-200 MG-UNIT tablet Take 1 tablet by mouth 2 (two) times daily.  . cefUROXime (CEFTIN) 500 MG tablet Take 500 mg by mouth 2 (two) times daily with a meal. For 7 days for UTI  . citalopram (CELEXA) 20 MG tablet Take 20 mg by mouth daily.  . cycloSPORINE (RESTASIS) 0.05 % ophthalmic emulsion Place 1 drop into both eyes 2 (two) times daily.  Marland Kitchen donepezil (ARICEPT) 10 MG tablet Take 10 mg by mouth at bedtime. For dementia  . hydrALAZINE (APRESOLINE) 25 MG tablet Take 25 mg by mouth 2 (two) times daily.  Marland Kitchen loratadine (CLARITIN) 10 MG tablet Take 10 mg by mouth daily.  Marland Kitchen LORazepam (ATIVAN) 0.5 MG tablet Take 1 tablet (0.5 mg total) by mouth 2 (two) times daily.  Marland Kitchen lovastatin (MEVACOR) 40 MG tablet Take 40 mg by mouth at bedtime.  . nitroGLYCERIN (NITROSTAT) 0.4 MG SL tablet Place 0.4 mg under the tongue every 5 (five) minutes as needed for chest pain.   . pantoprazole (PROTONIX) 40 MG tablet Take 40 mg by mouth daily.   . Travoprost, BAK Free, (TRAVATAN) 0.004 % SOLN ophthalmic solution Place 1 drop into both eyes at bedtime. For glaucoma  . [DISCONTINUED] amLODipine (NORVASC) 5 MG tablet Take 5 mg by mouth daily.  . [DISCONTINUED] diphenhydrAMINE (BENADRYL) 25 mg capsule Take 25 mg by mouth every 6 (six) hours as needed.   No facility-administered encounter medications on file as of 06/05/2017.     Review of Systems  In general does not complaining of any fever or chills.  Skin does not complain of rashes or itching or diaphoresis.  Head ears eyes nose mouth and throat does not complaining of any nasal discharge or sore throat.  Respiratory denies any shortness of breath or cough beyond baseline.  Cardiac does not complain of chest pain has chronic venous stasis changes.  GI does not complain of abdominal discomfort nausea vomiting diarrhea constipation.  GU is not complaining of dysuria is completing a course of antibiotics for  UTI.  Musculoskeletal does not complain of joint pain at this time at times will complain of leg pain.  Neurologic does not complaining of dizziness headache syncope.  Psych is not really complaining of any depression or anxiety at times will have anxiety  Immunization History  Administered Date(s) Administered  . Influenza Whole 06/11/2007, 10/16/2008, 05/21/2009, 05/05/2010  . Influenza-Unspecified 06/05/2014, 06/02/2016  . Pneumococcal Polysaccharide-23 06/11/2007  . Pneumococcal-Unspecified 06/06/2016  . Td 06/11/2007  . Zoster 05/21/2007   Pertinent  Health Maintenance Due  Topic Date Due  . INFLUENZA VACCINE  07/29/2017 (Originally 03/29/2017)  . PNA vac Low Risk Adult (2 of 2 - PCV13) 06/06/2017  . DEXA SCAN  Completed   Fall Risk  05/22/2017  Falls in the past year? No   Functional Status Survey:    Vitals:   06/05/17 1329  BP: (!) 168/81  Pulse: 87  Resp: (!) 21  Temp: 98.4 F (36.9 C)  TempSrc: Oral  SpO2: 94%    Physical Exam  In general this is a pleasant elderly female she is in no distress sitting comfortably in her wheelchair.  Her skin is warm and dry she is not diaphoretic she has venous stasis changes of her lower extremities  Oropharynx is clear mucous membranes moist.  Chest she has some scattered wheezing which appears to be baseline for her no labored breathing.  Heart is regular rate and rhythm without murmur gallop or rub she has baseline venous stasis changes edema lower extremities bilaterally.  Her abdomen to be soft nontender positive bowel sounds.  Muscle skeletal moves all extremities at baseline is ambulatory in a wheelchair I do not note any changes from baseline.  Neurologic is grossly intact speech is clear no lateralizing findings.  Psych she is grossly alert and oriented she is not anxious.    Labs reviewed:  Recent Labs  04/11/17 0700 05/19/17 0800 06/05/17 0700  NA 139 139 140  K 4.1 4.1 4.1  CL 103 102 102  CO2  27 29 28   GLUCOSE 91 85 87  BUN 53* 37* 26*  CREATININE 1.43* 1.22* 1.15*  CALCIUM 9.5 9.4 9.1    Recent Labs  09/27/16 0715  AST 20  ALT 15  ALKPHOS 57  BILITOT 0.5  PROT 7.1  ALBUMIN 3.9    Recent Labs  02/17/17 0707 03/20/17 0600 05/19/17 0800 06/05/17 0700  WBC 4.2 5.4 5.8 4.8  NEUTROABS 2.1  --  3.2 2.7  HGB 11.2* 12.1 11.2* 10.8*  HCT 34.5* 37.6 34.0* 33.7*  MCV 83.5 84.3 86.5 87.3  PLT 211 227 209 234   Lab Results  Component Value Date   TSH 1.454 04/10/2015   Lab Results  Component Value Date   HGBA1C 5.5 03/20/2017   Lab Results  Component Value Date   CHOL 215 (H) 02/17/2017   HDL 103 02/17/2017   LDLCALC 99 02/17/2017   TRIG 66 02/17/2017   CHOLHDL 2.1 02/17/2017    Significant Diagnostic Results in last 30 days:  No results found.  Assessment/Plan T1 history of hypoxia-unknown etiology-at this point she clinically appears to be stable willawait results of chest x-ray.  Also notify provider of any acute dyspnea her O2 stats below 88%.   did do a trial course off oxygen and she was 91% after a fairly extended period of time-at this point will monitor. Physical exam remained stable throughout serial checks  Have told staff to try to keep her off oxygen is much as possible to see if she maintains her oxygen saturation on an adequate level.  Also have ordered labs which appear unremarkable except for d-dimer is mildly elevated at 0.83-she is not showing any signs of chest pain or dyspnea-at this point will await chest x-ray-if chest x-ray comes back negative possibly consider a CT of chest--this was discussed with Dr. Lyndel Safe.-- and per serial checks E appears to be comfortable with no sign of distress no chest pain or complaints of shortness of breath   [id number]-of note greater than 35 minutes spent assessing patient-reassessing patient later in the day to ensure stability and doing a trial course off oxygen-reviewing her chart-reviewing her  labs-discussing her status with nursing staff as well as with Dr. Corrinne Eagle coordinating and formulating a plan of care-of note greater than 50% of time spent coordinating plan of care with input as noted above

## 2017-06-06 ENCOUNTER — Non-Acute Institutional Stay (SKILLED_NURSING_FACILITY): Payer: Medicare Other | Admitting: Internal Medicine

## 2017-06-06 ENCOUNTER — Encounter: Payer: Self-pay | Admitting: Internal Medicine

## 2017-06-06 DIAGNOSIS — R7989 Other specified abnormal findings of blood chemistry: Secondary | ICD-10-CM | POA: Diagnosis not present

## 2017-06-06 DIAGNOSIS — R0902 Hypoxemia: Secondary | ICD-10-CM | POA: Diagnosis not present

## 2017-06-06 NOTE — Progress Notes (Signed)
Location:   [facility] Room Number: 122/D Place of Service:  SNF [phone]) Provider:  Mickel Duhamel, PA-C  Patient Care Team: [name] as PCP - General (Internal Medicine) [name], MD as Consulting Physician (Geriatric Medicine)  Extended Emergency Contact Information Primary Emergency Contact: [name], [place] [address] [name] of [place] Phone: [phone] Relation: Daughter Secondary Emergency Contact: [name] States of [place] Phone: [phone] Relation: Son  Code Status:  DNR Goals of care: Advanced Directive information Advanced Directives 06/06/2017  Does Patient Have a Medical Advance Directive? Yes  Type of Advance Directive Out of facility DNR (pink MOST or yellow form)  Does patient want to make changes to medical advance directive? No - Patient declined  Copy of [place] in Chart? No - copy requested  Pre-existing out of facility DNR order (yellow form or pink MOST form) -     Chief complaint-acute visit follow-up hypoxia  HPI:  Pt is a 83 y.o. female seen today for an acute visit for Follow-up hypoxia.  She was found to have an oxygen saturation of 83% she was put on oxygen and O2 sat apparently rose fairly rapidly into the 90s.  She ihasnot really complained of increased shortness of breath beyond baseline.  Throughout the day she was monitored yesterday and at one point was off oxygen for an extended period of time in O2 saturation was in the low 90s.  We did order a chest x-ray which did not show any acute process.  We also ordered labs which were fairly unremarkable except d-dimer was somewhat elevated at 0.83  - Apparently she has been receiving oxygen intermittently since yesterday her O2 saturation this morning was 88% per nursing and oxygen was administered and again her oxygen saturation did rise up into the low 90s.  She is  not complaining of any chest pain or tachycardia or palpitations or syncope.  She has somewhat chronic complaints of shortness of breath but says her breathing is about the same as always been.   does have a history of a possible distal femoral DVT-at that time Doppler showed a small amount of age-indeterminate possibly nonocclusive DVT in the distal left femoral day-at one point was on Eliquis pain-there was an attempt switched to aspirin but stated she had an aspirin allergy and refused it.  Updated Doppler actually did not show DVT.  Halso has a history of diastolic CHF she had been on Lasix but refused this nonetheless her weight appears to have stabilized--in the mid 180s     Past Medical History:  Diagnosis Date  . Anxiety   . Cellulitis 2016  . CHF (congestive heart failure) ([place])   . Chronic kidney disease   . Dementia   . Dysphagia   . Gait abnormality   . GERD (gastroesophageal reflux disease)   . Glaucoma   . Gout   . Hyperlipidemia   . Hypertension   . Muscle weakness   . Myocardial infarction [facility]) [place]  . Osteoarthritis   . Syncope    Past Surgical History:  Procedure Laterality Date  . APPENDECTOMY    . CATARACT EXTRACTION W/PHACO  08/04/2011   Procedure: CATARACT EXTRACTION PHACO AND INTRAOCULAR LENS PLACEMENT (IOC);  Surgeon: Tonny Branch;  Location: [facility];  Service: Ophthalmology;  Laterality: Right;  CDE=18.53  . CATARACT EXTRACTION W/PHACO  08/25/2011   Procedure: CATARACT EXTRACTION PHACO AND INTRAOCULAR LENS PLACEMENT (IOC);  Surgeon: Tonny Branch;  Location: [facility];  Service: Ophthalmology;  Laterality: Left;  CDE:14.76  . TONSILLECTOMY      Allergies  Allergen Reactions  . Bee Venom Anaphylaxis  . Angiotensin Receptor Blockers     Tongue swelling.  . Aspirin     Directed by physician  . Cabbage Other (See Comments)    Certain foods due to gout flares  . Latex Rash    Outpatient Encounter Prescriptions as of 06/06/2017  Medication  Sig  . acetaminophen (TYLENOL) 500 MG tablet Take 1,000 mg by mouth 2 (two) times daily. For leg pain and arthritis. May have additional 1000 mg for break through pain  . amLODipine (NORVASC) 2.5 MG tablet Take 2.5 mg by mouth daily.  . Brinzolamide-Brimonidine (SIMBRINZA) 1-0.2 % SUSP Apply to eye. One  drop into both eyes three times a day  . Calcium Carbonate-Vitamin D (CALCIUM-VITAMIN D) 500-200 MG-UNIT tablet Take 1 tablet by mouth 2 (two) times daily.  . citalopram (CELEXA) 20 MG tablet Take 20 mg by mouth daily.  . cycloSPORINE (RESTASIS) 0.05 % ophthalmic emulsion Place 1 drop into both eyes 2 (two) times daily.  Marland Kitchen donepezil (ARICEPT) 10 MG tablet Take 10 mg by mouth at bedtime. For dementia  . hydrALAZINE (APRESOLINE) 25 MG tablet Take 25 mg by mouth 2 (two) times daily.  Marland Kitchen loratadine (CLARITIN) 10 MG tablet Take 10 mg by mouth daily.  Marland Kitchen LORazepam (ATIVAN) 0.5 MG tablet Take 1 tablet (0.5 mg total) by mouth 2 (two) times daily.  Marland Kitchen lovastatin (MEVACOR) 40 MG tablet Take 40 mg by mouth at bedtime.  . nitroGLYCERIN (NITROSTAT) 0.4 MG SL tablet Place 0.4 mg under the tongue every 5 (five) minutes as needed for chest pain.   . pantoprazole (PROTONIX) 40 MG tablet Take 40 mg by mouth daily.   . Travoprost, BAK Free, (TRAVATAN) 0.004 % SOLN ophthalmic solution Place 1 drop into both eyes at bedtime. For glaucoma  . [DISCONTINUED] cefUROXime (CEFTIN) 500 MG tablet Take 500 mg by mouth 2 (two) times daily with a meal. For 7 days for UTI   No facility-administered encounter medications on file as of 06/06/2017.     Review of Systems  General she does not complaining of any fever or chills.  Skin does not complain of diaphoresis rash or itching.  Head ears eyes nose mouth throat not complaining of visual changes or sore throat.  Respiratory denies increased shortness of breath from baseline or increased cough from baseline.  Cardiac does not complain of chest pain or palpitations.  GI is  not complaining of any nausea vomiting diarrhea constipation or abdominal discomfort.  GU does not complain of dysuria.  Muscle skeletal does not complain of chest discomfort or leg pain at times will complain of leg pain but is not really complaining today.  Neurologic does not complain of dizziness headache syncope or numbness.  Psych does not really appear overtly anxious today at times does have anxiety does not really complain today appears to be comfortable   Immunization History  Administered Date(s) Administered  . Influenza Whole 06/11/2007, 10/16/2008, 05/21/2009, 05/05/2010  . Influenza-Unspecified 06/05/2014, 06/02/2016  . Pneumococcal Polysaccharide-23 06/11/2007  . Pneumococcal-Unspecified 06/06/2016  . Td 06/11/2007  . Zoster 05/21/2007   Pertinent  Health Maintenance Due  Topic Date Due  . PNA vac Low Risk Adult (2 of 2 - PCV13) 06/06/2017  . INFLUENZA VACCINE  07/29/2017 (  Originally 03/29/2017)  . DEXA SCAN  Completed   Fall Risk  05/22/2017  Falls in the past year? No   Functional Status Survey:    Vitals:   06/06/17 1336  BP: (!) 144/66  Pulse: 64  Resp: 20  Temp: 98 F (36.7 C)  TempSrc: Oral    Physical Exam   In general this a pleasant elderly female in no distress sitting comfortably in her wheelchair.  Her skin is warm and dry there is no diaphoresis as venous stasis changes of her lower extremities I do not note any increase erythema or warmth from baseline.  Oropharynx clear mucous membranes moist.  Chest has some mild expiratory wheezing upper lung fields which is not really changed from her baseline.  There is no labored breathing.  Heart is regular rate and rhythm without murmur gallop or rub she has her chronic venous stasis changes edema appears to be at baseline for her.  Abdomen soft obese soft nontender with positive bowel sounds.  Muscle skeletal moves all extremities at baseline 4 ambulates in a wheelchair strength appears to  be intact all 4 extremities with lower certainly weakness because of chronic edema.  Neurologic is grossly intact her speech is clear and there are no lateralizing findings.  Psych continues to be grossly alert and oriented pleasant and appropriate   Labs reviewed:  Recent Labs  04/11/17 0700 05/19/17 0800 06/05/17 0700  NA 139 139 140  K 4.1 4.1 4.1  CL 103 102 102  CO2 27 29 28   GLUCOSE 91 85 87  BUN 53* 37* 26*  CREATININE 1.43* 1.22* 1.15*  CALCIUM 9.5 9.4 9.1    Recent Labs  09/27/16 0715  AST 20  ALT 15  ALKPHOS 57  BILITOT 0.5  PROT 7.1  ALBUMIN 3.9    Recent Labs  02/17/17 0707 03/20/17 0600 05/19/17 0800 06/05/17 0700  WBC 4.2 5.4 5.8 4.8  NEUTROABS 2.1  --  3.2 2.7  HGB 11.2* 12.1 11.2* 10.8*  HCT 34.5* 37.6 34.0* 33.7*  MCV 83.5 84.3 86.5 87.3  PLT 211 227 209 234   Lab Results  Component Value Date   TSH 1.454 04/10/2015   Lab Results  Component Value Date   HGBA1C 5.5 03/20/2017   Lab Results  Component Value Date   CHOL 215 (H) 02/17/2017   HDL 103 02/17/2017   LDLCALC 99 02/17/2017   TRIG 66 02/17/2017   CHOLHDL 2.1 02/17/2017    Significant Diagnostic Results in last 30 days:  No results found.  Assessment/Plan  #1 hypoxia-this appears to be somewhat intermittent and transient-chest x-ray was reassuring showing no acute process-clinically she appears to be stable however with elevated d-dimer and negative chest x-ray will order a CT scan of the chest angiogram to rule out pulmonary embolism-again clinically she does not really show signs of any discomfort with increased shortness of breath or chest pain or tachycardia actually appears to be feeling quite well-however with the elevated d-dimer would like to obtain the CT scan again she does have intermittent hypoxia.--This plan was discussed with Dr. Lyndel Safe  At this  point monitor vital signs pulse ox every shift to keep an eye on her clinical status.  [id number]

## 2017-06-07 ENCOUNTER — Ambulatory Visit (HOSPITAL_COMMUNITY)
Admit: 2017-06-07 | Discharge: 2017-06-07 | Disposition: A | Discharge status: Home / Self Care | Payer: Medicare Other | Source: Ambulatory Visit | Attending: Internal Medicine | Admitting: Internal Medicine

## 2017-06-07 ENCOUNTER — Encounter: Payer: Self-pay | Admitting: Internal Medicine

## 2017-06-07 ENCOUNTER — Non-Acute Institutional Stay (SKILLED_NURSING_FACILITY): Payer: Medicare Other | Admitting: Internal Medicine

## 2017-06-07 DIAGNOSIS — R7989 Other specified abnormal findings of blood chemistry: Secondary | ICD-10-CM | POA: Diagnosis not present

## 2017-06-07 DIAGNOSIS — B370 Candidal stomatitis: Secondary | ICD-10-CM

## 2017-06-07 DIAGNOSIS — J181 Lobar pneumonia, unspecified organism: Secondary | ICD-10-CM | POA: Diagnosis not present

## 2017-06-07 DIAGNOSIS — I10 Essential (primary) hypertension: Secondary | ICD-10-CM

## 2017-06-07 DIAGNOSIS — J189 Pneumonia, unspecified organism: Secondary | ICD-10-CM

## 2017-06-07 DIAGNOSIS — R918 Other nonspecific abnormal finding of lung field: Secondary | ICD-10-CM | POA: Insufficient documentation

## 2017-06-07 DIAGNOSIS — M4324 Fusion of spine, thoracic region: Secondary | ICD-10-CM | POA: Insufficient documentation

## 2017-06-07 DIAGNOSIS — I700 Atherosclerosis of aorta: Secondary | ICD-10-CM | POA: Diagnosis not present

## 2017-06-07 DIAGNOSIS — I517 Cardiomegaly: Secondary | ICD-10-CM | POA: Insufficient documentation

## 2017-06-07 DIAGNOSIS — R0789 Other chest pain: Secondary | ICD-10-CM | POA: Diagnosis not present

## 2017-06-07 MED ORDER — IOPAMIDOL (ISOVUE-370) INJECTION 76%
100.0000 mL | Freq: Once | INTRAVENOUS | Status: AC | PRN
  Administered 2017-06-07: 100 mL via INTRAVENOUS

## 2017-06-07 NOTE — Progress Notes (Signed)
Location:   [facility] Room Number: 122/D Place of Service:  SNF [phone]) Provider:  Mickel Duhamel, PA-C  Patient Care Team: [name] as PCP - General (Internal Medicine) [name], MD as Consulting Physician (Geriatric Medicine)  Extended Emergency Contact Information Primary Emergency Contact: [name], [place] [address] [name] of [place] Phone: [phone] Relation: Daughter Secondary Emergency Contact: [name] [place] of [place] Phone: [phone] Relation: Son  Code Status:  DNR Goals of care: Advanced Directive information Advanced Directives 06/07/2017  Does Patient Have a Medical Advance Directive? Yes  Type of Advance Directive Out of facility DNR (pink MOST or yellow form)  Does patient want to make changes to medical advance directive? No - Patient declined  Copy of [place] in Chart? No - copy requested  Pre-existing out of facility DNR order (yellow form or pink MOST form) -     Chief Complaint  Patient presents with  . Acute Visit    F/U Pnuemonia  Diagnosis via CT scan  HPI:  Pt is a 83 y.o. female seen today for an acute visit for follow-up with hypoxia-patient was noted earlier this week  occasionally have O2 saturations in the high 80s once oxygen was applied it comes up to the 90s.  This is relatively new for her and we did order a chest x-ray-mobile  x-ray did not really show any acute process.  We did order lab work including a d-dimer which was mildly elevated at 0.8 3-subsequently we obtained a CT l today to rule out pulmonary embolism.--CT scan was negative for any pulmonary embolism--- it did show a small patchy area of infiltrate in the right upper lobe consistent with pneumonia.  Currently she still is mildly hypoxic when oxygen is discontinued --she is resting in bed comfortably-she does not really have any acute complaints  says her breathing is essentially baseline with what it has been earlier this week.  She really has not been coughing much-appears essentially at her baseline     Past Medical History:  Diagnosis Date  . Anxiety   . Cellulitis 2016  . CHF (congestive heart failure) ([place])   . Chronic kidney disease   . Dementia   . Dysphagia   . Gait abnormality   . GERD (gastroesophageal reflux disease)   . Glaucoma   . Gout   . Hyperlipidemia   . Hypertension   . Muscle weakness   . Myocardial infarction [facility]  . Osteoarthritis   . Syncope    Past Surgical History:  Procedure Laterality Date  . APPENDECTOMY    . CATARACT EXTRACTION W/PHACO  08/04/2011   Procedure: CATARACT EXTRACTION PHACO AND INTRAOCULAR LENS PLACEMENT (IOC);  Surgeon: Tonny Branch;  Location: [facility];  Service: Ophthalmology;  Laterality: Right;  CDE=18.53  . CATARACT EXTRACTION W/PHACO  08/25/2011   Procedure: CATARACT EXTRACTION PHACO AND INTRAOCULAR LENS PLACEMENT (IOC);  Surgeon: Tonny Branch;  Location: [facility];  Service: Ophthalmology;  Laterality: Left;  CDE:14.76  . TONSILLECTOMY      Allergies  Allergen Reactions  . Bee Venom Anaphylaxis  . Angiotensin Receptor Blockers     Tongue swelling.  . Aspirin     Directed by physician  . Cabbage Other (See Comments)    Certain foods due to gout flares  . Latex Rash    Outpatient Encounter Prescriptions  as of 06/07/2017  Medication Sig  . acetaminophen (TYLENOL) 500 MG tablet Take 1,000 mg by mouth 2 (two) times daily. For leg pain and arthritis. May have additional 1000 mg for break through pain  . amLODipine (NORVASC) 2.5 MG tablet Take 2.5 mg by mouth daily.  . Brinzolamide-Brimonidine (SIMBRINZA) 1-0.2 % SUSP Apply to eye. One  drop into both eyes three times a day  . Calcium Carbonate-Vitamin D (CALCIUM-VITAMIN D) 500-200 MG-UNIT tablet Take 1 tablet by mouth 2 (two) times daily.  . citalopram (CELEXA) 20 MG tablet Take 20 mg by mouth daily.    . cycloSPORINE (RESTASIS) 0.05 % ophthalmic emulsion Place 1 drop into both eyes 2 (two) times daily.  Marland Kitchen donepezil (ARICEPT) 10 MG tablet Take 10 mg by mouth at bedtime. For dementia  . hydrALAZINE (APRESOLINE) 25 MG tablet Take 25 mg by mouth 2 (two) times daily.  Marland Kitchen loratadine (CLARITIN) 10 MG tablet Take 10 mg by mouth daily.  Marland Kitchen LORazepam (ATIVAN) 0.5 MG tablet Take 1 tablet (0.5 mg total) by mouth 2 (two) times daily.  Marland Kitchen lovastatin (MEVACOR) 40 MG tablet Take 40 mg by mouth at bedtime.  . nitroGLYCERIN (NITROSTAT) 0.4 MG SL tablet Place 0.4 mg under the tongue every 5 (five) minutes as needed for chest pain.   . pantoprazole (PROTONIX) 40 MG tablet Take 40 mg by mouth daily.   . Travoprost, BAK Free, (TRAVATAN) 0.004 % SOLN ophthalmic solution Place 1 drop into both eyes at bedtime. For glaucoma   No facility-administered encounter medications on file as of 06/07/2017.     Review of Systems Is not complaining of any fever or chills says her breathing is essentially baseline.  Skin is not complaining of any diaphoresis.  Head ears eyes nose mouth and throat-no complaint of visual changes or sore throat.  Respiratory does not complain of increased shortness of breath beyond baseline does not really complain of increased cough has intermittent cough.  Cardiac does not complain of chest pain has chronic venous stasis changes lower extremities.  GI does not complain of abdominal discomfort nausea vomiting diarrhea constipation.  So skeletal is not really complaining of joint pain today at times will complain of leg pain but does not appear to be an issue today.  Neurologic does not complaining of dizziness headache or syncope.  Psych does not complain of overt depression or anxiety at this time-she did receive an Ativan before the CT scan secondary to history of anxiety.   Immunization History  Administered Date(s) Administered  . Influenza Whole 06/11/2007, 10/16/2008, 05/21/2009,  05/05/2010  . Influenza-Unspecified 06/05/2014, 06/02/2016, 06/02/2017  . Pneumococcal Conjugate-13 06/06/2016  . Pneumococcal Polysaccharide-23 06/11/2007, 06/01/2016  . Pneumococcal-Unspecified 06/06/2016  . Td 06/11/2007  . Zoster 05/21/2007   Pertinent  Health Maintenance Due  Topic Date Due  . PNA vac Low Risk Adult (2 of 2 - PCV13) 06/06/2017  . INFLUENZA VACCINE  Completed  . DEXA SCAN  Completed   Fall Risk  05/22/2017  Falls in the past year? No   Functional Status Survey:    Vitals:   06/07/17 1512  BP: (!) 183/65  Pulse: 67  Resp: 20  Temp: (!) 97.4 F (36.3 C)  TempSrc: Oral  SpO2: 90%  Of note manual blood pressure retaken was 142/62 Physical Exam  In general this is a pleasant elderly female in no distress lying comfortably in bed she has oxygen applied.  Skin is warm and dry she does have venous stasis changes lower  extremities.  Oropharynx has a slight white film is membranes appear moist.  Chest continue with some mild expiratory wheezing of her lung fields with somewhat reduced entry no labored breathing.  Heart is regular rate and rhythm without murmur gallop or rub has chronic venous stasis changes lower extremity bilaterally.  Abdomen soft obese nontender with positive bowel sounds.  Musculoskeletal continues to move all her extremities 4 at baseline strength appears to be intact.  Neurologic is grossly intact speech is clear and does not have lateralizing findings.  Psych she is alert and grossly oriented pleasant   Labs reviewed:  Recent Labs  04/11/17 0700 05/19/17 0800 06/05/17 0700  NA 139 139 140  K 4.1 4.1 4.1  CL 103 102 102  CO2 27 29 28   GLUCOSE 91 85 87  BUN 53* 37* 26*  CREATININE 1.43* 1.22* 1.15*  CALCIUM 9.5 9.4 9.1    Recent Labs  09/27/16 0715  AST 20  ALT 15  ALKPHOS 57  BILITOT 0.5  PROT 7.1  ALBUMIN 3.9    Recent Labs  02/17/17 0707 03/20/17 0600 05/19/17 0800 06/05/17 0700  WBC 4.2 5.4 5.8 4.8   NEUTROABS 2.1  --  3.2 2.7  HGB 11.2* 12.1 11.2* 10.8*  HCT 34.5* 37.6 34.0* 33.7*  MCV 83.5 84.3 86.5 87.3  PLT 211 227 209 234   Lab Results  Component Value Date   TSH 1.454 04/10/2015   Lab Results  Component Value Date   HGBA1C 5.5 03/20/2017   Lab Results  Component Value Date   CHOL 215 (H) 02/17/2017   HDL 103 02/17/2017   LDLCALC 99 02/17/2017   TRIG 66 02/17/2017   CHOLHDL 2.1 02/17/2017    Significant Diagnostic Results in last 30 days:  Ct Angio Chest Pe W Or Wo Contrast  Result Date: 06/07/2017 CLINICAL DATA:  Shortness of breath.  Elevated D-dimer. EXAM: CT ANGIOGRAPHY CHEST WITH CONTRAST TECHNIQUE: Multidetector CT imaging of the chest was performed using the standard protocol during bolus administration of intravenous contrast. Multiplanar CT image reconstructions and MIPs were obtained to evaluate the vascular anatomy. CONTRAST:  100 cc Isovue 370 COMPARISON:  CT scan of the chest dated 10/09/2007 FINDINGS: Cardiovascular: Satisfactory opacification of the pulmonary arteries to the segmental level. No evidence of pulmonary embolism. Cardiomegaly. Aortic atherosclerosis. No pericardial effusion. Mediastinum/Nodes: No enlarged mediastinal, hilar, or axillary lymph nodes. Thyroid gland, trachea, and esophagus demonstrate no significant findings. Lungs/Pleura: Faint small patchy areas of infiltrate in the right upper lobe. Upper Abdomen: No acute abnormality. Musculoskeletal: There is fusion of most of the thoracic spine. Does the patient have ankylosing spondylitis or other inflammatory spondylitis? Review of the MIP images confirms the above findings. IMPRESSION: 1. No evidence of pulmonary emboli. 2. Small patchy area of infiltrate in the right upper lobe consistent with pneumonia. 3. Cardiomegaly and aortic atherosclerosis. 4. Diffuse fusion of most of the thoracic spine suggesting ankylosing spondylitis or other inflammatory spondylitis. Electronically Signed   By:  Lorriane Shire M.D.   On: 06/07/2017 12:17    Assessment/Plan  #1-hypoxia with pneumonia via CT scan-will treat with Avelox 400 mg daily for 7 days she has been on this before and tolerated it well-also will add duo nebs when necessary every 6 hours when necessary-as well as probiotic twice a day for 10 days.  Continue to monitor vital signs pulse ox every shift   It is reassuring that CT scan was negative for pulmonary embolism.  #2  thrush-will treat with  nystation 5 mL 4 times a day for 5 days and monitor  3 hypertension-she continues to have variable systolic blood pressures again manual reading was 142/62 when she gets excited her blood pressure does shoot up and at times at night her blood pressure is actually low-would be hesitant  change medications at this time secondary to variability-she continues on Norvasc 2.5 mg a day hydralazine 25 mg twice a day.-- Lotensin was recently discontinued secondary to an episode of tongue swelling-if her pressures consistent  elevated consider increasing Norvasc.--Norvasc was recently started approximately a week ago because of Lotensin being discontinued.  [id number]

## 2017-06-13 DIAGNOSIS — F320 Major depressive disorder, single episode, mild: Secondary | ICD-10-CM | POA: Diagnosis not present

## 2017-06-13 DIAGNOSIS — F411 Generalized anxiety disorder: Secondary | ICD-10-CM | POA: Diagnosis not present

## 2017-06-13 DIAGNOSIS — F428 Other obsessive-compulsive disorder: Secondary | ICD-10-CM | POA: Diagnosis not present

## 2017-06-13 DIAGNOSIS — F4310 Post-traumatic stress disorder, unspecified: Secondary | ICD-10-CM | POA: Diagnosis not present

## 2017-06-14 ENCOUNTER — Other Ambulatory Visit: Payer: Self-pay

## 2017-06-14 MED ORDER — LORAZEPAM 0.5 MG PO TABS: 0.5000 mg | ORAL_TABLET | Freq: Two times a day (BID) | ORAL | 0 refills | Status: DC

## 2017-06-14 NOTE — Telephone Encounter (Signed)
RX Fax for [facility]@ [phone]  

## 2017-06-19 DIAGNOSIS — F320 Major depressive disorder, single episode, mild: Secondary | ICD-10-CM | POA: Diagnosis not present

## 2017-06-19 DIAGNOSIS — F411 Generalized anxiety disorder: Secondary | ICD-10-CM | POA: Diagnosis not present

## 2017-06-19 DIAGNOSIS — F4310 Post-traumatic stress disorder, unspecified: Secondary | ICD-10-CM | POA: Diagnosis not present

## 2017-06-19 DIAGNOSIS — F428 Other obsessive-compulsive disorder: Secondary | ICD-10-CM | POA: Diagnosis not present

## 2017-06-26 DIAGNOSIS — F411 Generalized anxiety disorder: Secondary | ICD-10-CM | POA: Diagnosis not present

## 2017-06-26 DIAGNOSIS — F428 Other obsessive-compulsive disorder: Secondary | ICD-10-CM | POA: Diagnosis not present

## 2017-06-26 DIAGNOSIS — F320 Major depressive disorder, single episode, mild: Secondary | ICD-10-CM | POA: Diagnosis not present

## 2017-06-27 ENCOUNTER — Non-Acute Institutional Stay (SKILLED_NURSING_FACILITY): Payer: Medicare Other | Admitting: Internal Medicine

## 2017-06-27 ENCOUNTER — Encounter: Payer: Self-pay | Admitting: Internal Medicine

## 2017-06-27 DIAGNOSIS — E785 Hyperlipidemia, unspecified: Secondary | ICD-10-CM | POA: Diagnosis not present

## 2017-06-27 DIAGNOSIS — N183 Chronic kidney disease, stage 3 unspecified: Secondary | ICD-10-CM

## 2017-06-27 DIAGNOSIS — F411 Generalized anxiety disorder: Secondary | ICD-10-CM | POA: Diagnosis not present

## 2017-06-27 DIAGNOSIS — I10 Essential (primary) hypertension: Secondary | ICD-10-CM

## 2017-06-27 DIAGNOSIS — I5032 Chronic diastolic (congestive) heart failure: Secondary | ICD-10-CM

## 2017-06-27 DIAGNOSIS — K219 Gastro-esophageal reflux disease without esophagitis: Secondary | ICD-10-CM | POA: Diagnosis not present

## 2017-06-27 DIAGNOSIS — F0391 Unspecified dementia with behavioral disturbance: Secondary | ICD-10-CM | POA: Diagnosis not present

## 2017-06-27 DIAGNOSIS — R0902 Hypoxemia: Secondary | ICD-10-CM

## 2017-06-27 NOTE — Progress Notes (Signed)
Location:   [facility] Room Number: 122/D Place of Service:  SNF (31) Provider:  Alonza Bogus, Anthoney Harada, PA-C  Patient Care Team: [name] as PCP - General (Internal Medicine) [name], MD as Consulting Physician (Geriatric Medicine)  Extended Emergency Contact Information Primary Emergency Contact: [name], [place] [address] [name] of [place] Phone: [phone] Relation: Daughter Secondary Emergency Contact: [name] [place] of [place] Phone: [phone] Relation: Son  Code Status:  DNR Goals of care: Advanced Directive information Advanced Directives 06/27/2017  Does Patient Have a Medical Advance Directive? Yes  Type of Advance Directive Out of facility DNR (pink MOST or yellow form)  Does patient want to make changes to medical advance directive? No - Patient declined  Copy of [place] in Chart? No - copy requested  Pre-existing out of facility DNR order (yellow form or pink MOST form) -     Chief Complaint  Patient presents with  . Medical Management of Chronic Issues    Routine Visit  Her medical management of chronic medical conditions including history of hypertension-diastolic CHF-recent hypoxia-chronic kidney disease-dementia-depression with anxiety-gerd  HPI:  Pt is a 83 y.o. female seen today for medical management of chronic diseases. As noted above.  Most recent acute issue was some hypoxia-initial mobile x-ray did not really show any acute process--we did order a d-dimer which came back mildly elevated 0.83-subsequently a CT scan of the chest was done which was negative for any pulmonary embolism but did show a right sided pneumonia.  She has completed the antibiotic for this and this appears to be stable she does not complain of any increased cough or congestion.  She is still on oxygen however O2 saturations are consistently in the  90s-according to patient when oxygen is been taken off at times her saturations will go into the 80s and she feels short of breath.  Regardless a history of venous stasis chronic lower extremity edema with history of diastolic CHF she has lost a significant amount of weight in had been around 180 pounds at the lowest point she gradually he is gaining some of this back in fact most recent weight was 190 pounds which is still below her baseline earlier this year in the mid 190s.  She does not appear to have any increased edema from baseline-she seems to indicate she is eating somewhat more at one point was trying to lose weight and said she was eating less.  She had been on Lasix at one point but she has refused this for some time now.  She also has a history of a possible distal left femoral DVT in the past this was diagnosed per Doppler-she will did complete a course of  Eliquis  an attempt made to transition to aspirin but she refuses the aspirin saying she was allergic to it.  Nonetheless updated Doppler really did not show evidence of the DVT   Regards to other medical issues she recently was diagnosed with UTI as well I month ago and has completed antibiotic for this she does not complain of dysuria.  Her blood pressure continues to be somewhat variable I one point she had been on hydralazine as well as Lotensin Lotensin was discontinued because of some lip and tongue swelling.  Eventually her hydralazine dose was reduced because of some hypotensive readings more so in the evening.  However her blood  pressure then was somewhat elevated and she was started on low dose Norvasc 2.5 mg a day.  She continues to have variable systolics earlier today was noted 174/75 however when I took it I got 140/60-see previous readings ranging from systolics in the 644I to systolics of 347.  At this point will monitor she has consistent elevations consider increasing the Norvasc.  She also has a stage III  kidney disease diagnosis but creatinine actually hasimproved on recent lab earlier this month at 1.15  Regards to dementia she is on Aricept she does well with supportive care continues to have at times anxiety is on Ativan which appears to be effective she also is on Celexa for coexistent depression.  Regards to hyperlipidemia this is stable on lovastatin LDL was 99 on lab done in June 2018 her HDL is significantly elevated actually at 103.  She also at times will complain of stomach and GI symptoms GI has looked at this and apparently does have some history of esophageal dismal facility but this appears to be stable she is on Protonix.  Regards to chronic leg and arthritic pain she is on Tylenol twice a day as well as once a day when necessary she is not really complained of this significantly recently which is encouraging.     Past Medical History:  Diagnosis Date  . Anxiety   . Cellulitis 2016  . CHF (congestive heart failure) ([place])   . Chronic kidney disease   . Dementia   . Dysphagia   . Gait abnormality   . GERD (gastroesophageal reflux disease)   . Glaucoma   . Gout   . Hyperlipidemia   . Hypertension   . Muscle weakness   . Myocardial infarction [facility] [place]  . Osteoarthritis   . Syncope    Past Surgical History:  Procedure Laterality Date  . APPENDECTOMY    . CATARACT EXTRACTION W/PHACO  08/04/2011   Procedure: CATARACT EXTRACTION PHACO AND INTRAOCULAR LENS PLACEMENT (IOC);  Surgeon: Tonny Branch;  Location: [facility];  Service: Ophthalmology;  Laterality: Right;  CDE=18.53  . CATARACT EXTRACTION W/PHACO  08/25/2011   Procedure: CATARACT EXTRACTION PHACO AND INTRAOCULAR LENS PLACEMENT (IOC);  Surgeon: Tonny Branch;  Location: [facility];  Service: Ophthalmology;  Laterality: Left;  CDE:14.76  . TONSILLECTOMY      Allergies  Allergen Reactions  . Bee Venom Anaphylaxis  . Angiotensin Receptor Blockers     Tongue swelling.  . Aspirin     Directed by physician  .  Cabbage Other (See Comments)    Certain foods due to gout flares  . Latex Rash    Outpatient Encounter Prescriptions as of 06/27/2017  Medication Sig  . acetaminophen (TYLENOL) 500 MG tablet Take 1,000 mg by mouth 2 (two) times daily. For leg pain and arthritis. May have additional 1000 mg for break through pain  . amLODipine (NORVASC) 2.5 MG tablet Take 2.5 mg by mouth daily.  . Brinzolamide-Brimonidine (SIMBRINZA) 1-0.2 % SUSP Apply to eye. One  drop into both eyes three times a day  . Calcium Carbonate-Vitamin D (CALCIUM-VITAMIN D) 500-200 MG-UNIT tablet Take 1 tablet by mouth 2 (two) times daily.  . citalopram (CELEXA) 20 MG tablet Take 20 mg by mouth daily.  . cycloSPORINE (RESTASIS) 0.05 % ophthalmic emulsion Place 1 drop into both eyes 2 (two) times daily.  Marland Kitchen donepezil (ARICEPT) 10 MG tablet Take 10 mg by mouth at bedtime. For dementia  . hydrALAZINE (APRESOLINE) 25 MG tablet Take  25 mg by mouth 2 (two) times daily.  Marland Kitchen ipratropium-albuterol (DUONEB) 0.5-2.5 (3) MG/3ML SOLN Take 3 mLs by nebulization every 6 (six) hours as needed.  . loratadine (CLARITIN) 10 MG tablet Take 10 mg by mouth daily.  Marland Kitchen LORazepam (ATIVAN) 0.5 MG tablet Take 1 tablet (0.5 mg total) by mouth 2 (two) times daily.  Marland Kitchen lovastatin (MEVACOR) 40 MG tablet Take 40 mg by mouth at bedtime.  . nitroGLYCERIN (NITROSTAT) 0.4 MG SL tablet Place 0.4 mg under the tongue every 5 (five) minutes as needed for chest pain.   . pantoprazole (PROTONIX) 40 MG tablet Take 40 mg by mouth daily.   . Travoprost, BAK Free, (TRAVATAN) 0.004 % SOLN ophthalmic solution Place 1 drop into both eyes at bedtime. For glaucoma   No facility-administered encounter medications on file as of 06/27/2017.      Review of Systems     In general she does not complaining of any fever or chills appears to have gradually gained some weight.  Skin does not complain of rashes or itching.  Head ears eyes nose mouth and throat does not complaining of  sore throat or visual changes.  Respiratory denies shortness breath or cough she is still on oxygen however.  Cardiac does not complain of chest pain has chronic lower extremity edema venous stasis changes.  GI is not complaining of any abdominal discomfort at this time does complain at times of constipation.  GU does not complain of dysuria has completed antibiotic for UTI.  Muscle skeletal is not complaining of joint pain or leg pain this evening at times will complain especially of leg pain.  Neurologic does not complain of dizziness headache syncope at this time or numbness.  Psych does not complain of depression symptoms were anxiety at times does become somewhat anxious Ativan apparently has been quite beneficial      Physical exam.  Temperature is 98.2 pulse 66 respirations 18 blood pressure taken manually 140/60 weight is 190 pounds   In general this is a pleasant elderly female in no distress sitting comfortably in her wheelchair she still has oxygen applied.  Her skin is warm and dry she is not diaphoretic continues with chronic venous stasis changes to her lower extremities.  Eyes visual acuity appears grossly intact sclera and conjunctiva are clear  Oropharynx is clear continues to have a slight white film on her tongue otherwise oropharynx is clear.  Chest has some very minimal expiratory wheezing the right base there is no labored breathing this is not really changed from baseline.  Heart is regular rate and rhythm without murmur gallop or rub chronic lower extremity edema appears to be relatively unchanged.  Abdomen soft obese soft nontender with active bowel sounds.  Musculoskeletal does move all her extremities 4 at baseline strength appears to be intact all 4 extremities she ambulates in a wheelchair.   Psych she is alert oriented to self pleasant and appropriate has some cognitive deficits but largely able to carry on a conversation    Immunization  History  Administered Date(s) Administered  . Influenza Whole 06/11/2007, 10/16/2008, 05/21/2009, 05/05/2010  . Influenza-Unspecified 06/05/2014, 06/02/2016, 06/02/2017, 06/02/2017  . Pneumococcal Conjugate-13 06/06/2016  . Pneumococcal Polysaccharide-23 06/11/2007, 06/01/2016  . Pneumococcal-Unspecified 06/06/2016  . Td 06/11/2007  . Tdap 06/24/2017  . Zoster 05/21/2007   Pertinent  Health Maintenance Due  Topic Date Due  . PNA vac Low Risk Adult (2 of 2 - PCV13) 06/06/2025 (Originally 06/06/2017)  . INFLUENZA VACCINE  Completed  .  DEXA SCAN  Completed   Fall Risk  05/22/2017  Falls in the past year? No   Functional Status Survey:    Vitals:   06/27/17 1430  BP: (!) 174/75  Pulse: 65  Resp: 18  Temp: 98.7 F (37.1 C)  TempSrc: Oral  SpO2: 96%  Weight: 190 lb (86.2 kg)  Height: 4\' 11"  (1.499 m)   Body mass index is 38.38 kg/m. Physical Exam  Labs reviewed:  Recent Labs  04/11/17 0700 05/19/17 0800 06/05/17 0700  NA 139 139 140  K 4.1 4.1 4.1  CL 103 102 102  CO2 27 29 28   GLUCOSE 91 85 87  BUN 53* 37* 26*  CREATININE 1.43* 1.22* 1.15*  CALCIUM 9.5 9.4 9.1    Recent Labs  09/27/16 0715  AST 20  ALT 15  ALKPHOS 57  BILITOT 0.5  PROT 7.1  ALBUMIN 3.9    Recent Labs  02/17/17 0707 03/20/17 0600 05/19/17 0800 06/05/17 0700  WBC 4.2 5.4 5.8 4.8  NEUTROABS 2.1  --  3.2 2.7  HGB 11.2* 12.1 11.2* 10.8*  HCT 34.5* 37.6 34.0* 33.7*  MCV 83.5 84.3 86.5 87.3  PLT 211 227 209 234   Lab Results  Component Value Date   TSH 1.454 04/10/2015   Lab Results  Component Value Date   HGBA1C 5.5 03/20/2017   Lab Results  Component Value Date   CHOL 215 (H) 02/17/2017   HDL 103 02/17/2017   LDLCALC 99 02/17/2017   TRIG 66 02/17/2017   CHOLHDL 2.1 02/17/2017    Significant Diagnostic Results in last 30 days:  Ct Angio Chest Pe W Or Wo Contrast  Result Date: 06/07/2017 CLINICAL DATA:  Shortness of breath.  Elevated D-dimer. EXAM: CT ANGIOGRAPHY  CHEST WITH CONTRAST TECHNIQUE: Multidetector CT imaging of the chest was performed using the standard protocol during bolus administration of intravenous contrast. Multiplanar CT image reconstructions and MIPs were obtained to evaluate the vascular anatomy. CONTRAST:  100 cc Isovue 370 COMPARISON:  CT scan of the chest dated 10/09/2007 FINDINGS: Cardiovascular: Satisfactory opacification of the pulmonary arteries to the segmental level. No evidence of pulmonary embolism. Cardiomegaly. Aortic atherosclerosis. No pericardial effusion. Mediastinum/Nodes: No enlarged mediastinal, hilar, or axillary lymph nodes. Thyroid gland, trachea, and esophagus demonstrate no significant findings. Lungs/Pleura: Faint small patchy areas of infiltrate in the right upper lobe. Upper Abdomen: No acute abnormality. Musculoskeletal: There is fusion of most of the thoracic spine. Does the patient have ankylosing spondylitis or other inflammatory spondylitis? Review of the MIP images confirms the above findings. IMPRESSION: 1. No evidence of pulmonary emboli. 2. Small patchy area of infiltrate in the right upper lobe consistent with pneumonia. 3. Cardiomegaly and aortic atherosclerosis. 4. Diffuse fusion of most of the thoracic spine suggesting ankylosing spondylitis or other inflammatory spondylitis. Electronically Signed   By: Lorriane Shire M.D.   On: 06/07/2017 12:17    Assessment/Plan   #1 history of hypoxemia she continues to have oxygen on-have discussed with nursing previously about doing a trial off at but apparently her stats going to the 80s-respiratory wise she appears to be stable she has completed treatment for pneumonia which actually did not show up on the mobile x-ray only on CT scan-at this point will monitor and continue to have a trial off O2  #2 history of diastolic CHF venous stasis changes-this appears stable clinically she has gained some weight although it still below her baseline-will update a BNP however  her BNP is largely been unremarkable  in the past-she continues to refuse attempts to start Lasix  #3 pneumonia-again she has completed antibiotic for this does not appear to be symptomatic  #4-hypertension this continues to be a challenge with at times elevated systolics that timest normalized and sometimes even low there are parameters to hold the hydralazine for systolic blood pressure less than 105 I got a blood pressure 140/60 early evening which is  acceptable-at this point will monitor she continues on hydralazine 25 mg twice a day as well as Norvasc 2.5 mg a day--if  she shows consistent elevations without hypotensive readings consider increasing the Norvasc  #5-history of distal left femoral DVT-again updated Doppler did not show evidence of this she did complete a course of  Eliquis--she has refuse aspirin as noted above-at this point will monitor leg exam tonight was fairly baseline as she legs appear to be somewhat less painful than usual.  #6 history stage III chronic kidney disease as noted above creatinine 1.15 actually shows improvement this lab was done early this month will monitor periodically.  #7 history of GERD with possible esophageal dysmotility this appears stable currently on Protonix she has been seen by GI.  #8 history hyperlipidemia LDL 99 on lab done in June 2018 she is on lovastatin appears satisfactory she has a very good LDL of 103.  #9 history of dementia she is doing well with supportive care on Aricept has some mild cognitive deficits but appears to be stable this is complicated with a history of anxiety which has stabilized with her Ativan she appears to do well with this.  She also has a history of depression which has been stable now for some time on Celexa.  #10 history of osteoarthritic pain leg pain this appears to be stabilized on routine Tylenol she does have a when necessary dose as noted.  Again will update a BNP secondary to weight gain although I  suspect this may be appetite related-she continues to have hypoxia at times but does well with the oxygen this has been going on now for some time-will discuss with nursing possible another trial off it to see how she does--she has completed treatment for pneumonia and appears to be asymptomatic  #11 anemia most likely chronic disease hemoglobin appears relatively stable at 10.8 on lab done earlier this month  #12 possible thrush-she has had an order for nystatin we will rewrite this and monitor  CPT- 99310-of note greater than 40 minutes spent assessing patient-reviewing her chart-reviewing her labs-and coordinating formulating plan of care for numerous diagnoses-of note greater than 50% of time spent coordinating plan of care

## 2017-06-28 ENCOUNTER — Encounter: Payer: Self-pay | Admitting: Internal Medicine

## 2017-06-28 ENCOUNTER — Encounter (HOSPITAL_COMMUNITY)
Admission: RE | Admit: 2017-06-28 | Discharge: 2017-06-28 | Disposition: A | Discharge status: Home / Self Care | Payer: Medicare Other | Source: Skilled Nursing Facility | Attending: Internal Medicine | Admitting: Internal Medicine

## 2017-06-28 ENCOUNTER — Non-Acute Institutional Stay (SKILLED_NURSING_FACILITY): Payer: Medicare Other | Admitting: Internal Medicine

## 2017-06-28 DIAGNOSIS — R0902 Hypoxemia: Secondary | ICD-10-CM

## 2017-06-28 DIAGNOSIS — J189 Pneumonia, unspecified organism: Secondary | ICD-10-CM | POA: Diagnosis not present

## 2017-06-28 DIAGNOSIS — I509 Heart failure, unspecified: Secondary | ICD-10-CM | POA: Diagnosis not present

## 2017-06-28 DIAGNOSIS — N390 Urinary tract infection, site not specified: Secondary | ICD-10-CM | POA: Insufficient documentation

## 2017-06-28 DIAGNOSIS — K5900 Constipation, unspecified: Secondary | ICD-10-CM | POA: Diagnosis not present

## 2017-06-28 DIAGNOSIS — I739 Peripheral vascular disease, unspecified: Secondary | ICD-10-CM | POA: Diagnosis not present

## 2017-06-28 DIAGNOSIS — B9620 Unspecified Escherichia coli [E. coli] as the cause of diseases classified elsewhere: Secondary | ICD-10-CM | POA: Insufficient documentation

## 2017-06-28 DIAGNOSIS — B351 Tinea unguium: Secondary | ICD-10-CM | POA: Diagnosis not present

## 2017-06-28 DIAGNOSIS — B370 Candidal stomatitis: Secondary | ICD-10-CM | POA: Insufficient documentation

## 2017-06-28 DIAGNOSIS — R7989 Other specified abnormal findings of blood chemistry: Secondary | ICD-10-CM

## 2017-06-28 LAB — BRAIN NATRIURETIC PEPTIDE: B NATRIURETIC PEPTIDE 5: 319 pg/mL — AB (ref 0.0–100.0)

## 2017-06-28 NOTE — Progress Notes (Signed)
This is an acute visit.  Level care skilled.  Facility is [facility].  Chief complaint-acute visit follow-up elevated BNP-constipation-hypoxia  History of present illness.  Patient is a pleasant 83 year old female seen yesterday for routine visit.  She has had some hypoxia over the past several weeks-we did do a chest x-ray which initially did not show any acute findings but says went CT scan did show a right lobe pneumonia-it was negative for any pulmonary embolism she had at one point and elevated d-dimer and thus the CT was ordered.  She has completed treatment for the pneumonia and appears to be asymptomatic-.  She also has a history of diastolic CHF had been on Lasix but she has refused this now for an extended period of time initially she lost some weight but appears to slowly be gaining this back although she is still about 5-10 pounds below her baseline previously.  We did order a BNP this morning that did come back mildly elevated at 319 previously her BMPs had been under 100.  She is not complaining of any increased shortness of breath but continues to need oxygen  to keep her sats above 90% we did do a trial off oxygen this morning and her stats did dip after 15 minutes into the higher 80s range.  Her main complaint actually is constipation saying she is having hard stool and would like something to help.    Past Medical History:  Diagnosis Date  . Anxiety   . Cellulitis 2016  . CHF (congestive heart failure) ([place])   . Chronic kidney disease   . Dementia   . Dysphagia   . Gait abnormality   . GERD (gastroesophageal reflux disease)   . Glaucoma   . Gout   . Hyperlipidemia   . Hypertension   . Muscle weakness   . Myocardial infarction [facility]) [place]  . Osteoarthritis   . Syncope         Past Surgical History:  Procedure Laterality Date  . APPENDECTOMY    . CATARACT EXTRACTION W/PHACO  08/04/2011   Procedure: CATARACT  EXTRACTION PHACO AND INTRAOCULAR LENS PLACEMENT (IOC);  Surgeon: Tonny Branch;  Location: [facility];  Service: Ophthalmology;  Laterality: Right;  CDE=18.53  . CATARACT EXTRACTION W/PHACO  08/25/2011   Procedure: CATARACT EXTRACTION PHACO AND INTRAOCULAR LENS PLACEMENT (IOC);  Surgeon: Tonny Branch;  Location: [facility];  Service: Ophthalmology;  Laterality: Left;  CDE:14.76  . TONSILLECTOMY           Allergies  Allergen Reactions  . Bee Venom Anaphylaxis  . Angiotensin Receptor Blockers     Tongue swelling.  . Aspirin     Directed by physician  . Cabbage Other (See Comments)    Certain foods due to gout flares  . Latex Rash        Outpatient [facility]   Medication Sig  . acetaminophen (TYLENOL) 500 MG tablet Take 1,000 mg by mouth 2 (two) times daily. For leg pain and arthritis. May have additional 1000 mg for break through pain  . amLODipine (NORVASC) 2.5 MG tablet Take 2.5 mg by mouth daily.  . Brinzolamide-Brimonidine (SIMBRINZA) 1-0.2 % SUSP Apply to eye. One  drop into both eyes three times a day  . Calcium Carbonate-Vitamin D (CALCIUM-VITAMIN D) 500-200 MG-UNIT tablet Take 1 tablet by mouth 2 (two) times daily.  . citalopram (CELEXA) 20 MG tablet Take 20 mg by mouth daily.  . cycloSPORINE (RESTASIS) 0.05 % ophthalmic emulsion Place  1 drop into both eyes 2 (two) times daily.  Marland Kitchen donepezil (ARICEPT) 10 MG tablet Take 10 mg by mouth at bedtime. For dementia  . hydrALAZINE (APRESOLINE) 25 MG tablet Take 25 mg by mouth 2 (two) times daily.  Marland Kitchen ipratropium-albuterol (DUONEB) 0.5-2.5 (3) MG/3ML SOLN Take 3 mLs by nebulization every 6 (six) hours as needed.  . loratadine (CLARITIN) 10 MG tablet Take 10 mg by mouth daily.  Marland Kitchen LORazepam (ATIVAN) 0.5 MG tablet Take 1 tablet (0.5 mg total) by mouth 2 (two) times daily.  Marland Kitchen lovastatin (MEVACOR) 40 MG tablet Take 40 mg by mouth at bedtime.  . nitroGLYCERIN (NITROSTAT) 0.4 MG SL tablet Place 0.4 mg under the tongue every 5 (five) minutes as  needed for chest pain.   . pantoprazole (PROTONIX) 40 MG tablet Take 40 mg by mouth daily.   . Travoprost, BAK Free, (TRAVATAN) 0.004 % SOLN ophthalmic solution Place 1 drop into both eyes at bedtime. For glaucoma   No facility-administered encounter medications on file as of 06/27/2017.    Review of systems.  In general she does not complaining fever or chills.  Skin does not complain of rashes or itching.  Head ears eyes nose mouth and throat no complaints of visual changes or difficulty swallowing at this point.  Respiratory is not really complaining of shortness breath or cough has required oxygen to keep her stats in the 90s.  Cardiac does not complain of chest pain has chronic lower extremity edema is does not appear to be grossly changed from baseline.  GI does not complain of abdominal pain nausea or vomiting but is complaining of what she feels is constipation with hard stools.  Musculoskeletal at this time is not complaining of joint pain has at times complained of leg pain.  Neurologic does not complain of dizziness headache or syncope.  Psych does not appear to be overtly anxious today does have some history of anxiety and depression but this has been stable recently.   Physical Exam  She is afebrile pulse 64 respirations of 18 blood pressure is 158/74 taken manually   In general this is a pleasant elderly female in no distress sitting comfortably in her wheelchair she is wearing oxygen.  Her skin continues to be warm and dry with venous stasis changes to her lower extremities bilaterally.  Oropharynx is clear mucous membranes moist.  Chest continues to be largely clear to auscultation with a very minimal amount of expiratory wheezing slightly at the right lung base.  There is no labored breathing.  Heart is regular rate and rhythm with occasional irregular beat she has chronic venous stasis changes lower extremity edema which appears relatively  unchanged.  Her abdomen is obese soft nontender with active bowel sounds.  Muscle skeletal moves all extremities at baseline and ambulates in a wheelchair at baseline strength appears to be intact all 4 extremities.  Neurologic as noted above her speech is clear she is alert.  Psych she is largely alert and oriented doesn't and appropriate she does have some cognitive deficits but these are fairly mild  Labs.  06/28/2017 BNP-319  06/05/2016.  Sodium 140 potassium 4.1 BUN 26 creatinine 1.15  WBC 4.8 hemoglobin 10.8 platelets 234  Assessment and plan.  #1 history of diastolic CHF with mildly elevated BNP-I did discuss fairly extensively with patient possibility of restarting Lasix possibly every other day-however she is quite adamant that she does not want to take Lasix saying essentially  it makes her urinate too much  decreases her quality of life-I did express that it may help with her CHF and breathing-she stated she understood that but she was happy with the way her medications are now and does not want the Lasix--even if it means she has increased swelling and heart problems--stating she's had a good long life and if it's time to go to it's time to go --her daughter was present during the conversation and understands her mother's wishes  At this point she does not really exhibit any signs of distress and appears to be enjoying relatively good quality of life.  I did discuss this with Dr. Lyndel Safe via phone and will order Lasix 20 mg a day in case she will take it occasionally although I suspect this could be challenging  Regards to hypoxia at this point will continue the oxygen appear she continues to have some desaturations when it was removed per trial today   constipation-will start Colace 100 mg po bid.   and hold for diarrhea and monitor she had good bowel sounds abdominal exam was quite benign today.  #3 hypertension she is on hydralazine as well as Norvasc it appears her  blood pressures tend to go lower later in the day I took her blood pressure late this morning and was systolically in the 829H at this point will monitor-again at some point may need to increase her Norvasc--  [id number]

## 2017-07-04 ENCOUNTER — Encounter: Payer: Self-pay | Admitting: Internal Medicine

## 2017-07-04 ENCOUNTER — Non-Acute Institutional Stay (SKILLED_NURSING_FACILITY): Payer: Medicare Other | Admitting: Internal Medicine

## 2017-07-04 DIAGNOSIS — R635 Abnormal weight gain: Secondary | ICD-10-CM

## 2017-07-04 DIAGNOSIS — K5900 Constipation, unspecified: Secondary | ICD-10-CM

## 2017-07-04 DIAGNOSIS — I10 Essential (primary) hypertension: Secondary | ICD-10-CM

## 2017-07-04 NOTE — Progress Notes (Signed)
This is an acute visit.  Level of care skilled.  Facility--[facility]  Chief complaint-acute visit follow-up  hypoxia-weight gain-hypertension  History of present illness.  Patient is a pleasant 83 year old femaleseen today for follow-up of weight gain as well as hypertension.  She has had hypoxia over the past several weeks-chest x-ray did not really show any acute findings but CT scan did show a right lower lobe pneumonia was negative for any pulmonary embolism-CT was ordered because her d-dimer was somewhat elevated.  She was treated for pneumoniabut continues at times to hve hypoxia and is on oxygen.  She has failed trials off oxygen with O2 status going into the 80s  She is not colaining of any acute shortness of breath or cough or congestion however.  She does havhF at one point had been on Lasix but  refused it so it was DC'd-however it appears she gained some weight it appears from June will late October she gained about 10 pounds although she had lost a significant amount of weight previous to that  Her BNP which often was under 100 actually was over 300 recently-we tried to talk her into taking Lasix at least occasionally-and actually ordered it but she has consistently refused--nonetheless her weight if scales are accurate actually has gone down about 7 pounds.  She also has a history of hypertension-has had elevated readings at times  Tended to go lower during the day and at night actually had somewhat hypotensive readings-she is on hydralazine 25 mg twice a day as well as Norvasc 2.5 mg a day-Norvasc was recently added because of some higher consistent readings.  Per chart review it appears that time she does have systolics in the 509T and 170s-I actually got 160/80 manually this evening.  She also recently complained of constipation has been started on Colace and apparently this is helping   Past Medical History:  Diagnosis Date  . Anxiety   . Cellulitis 2016    . CHF (congestive heart failure) ([place])   . Chronic kidney disease   . Dementia   . Dysphagia   . Gait abnormality   . GERD (gastroesophageal reflux disease)   . Glaucoma   . Gout   . Hyperlipidemia   . Hypertension   . Muscle weakness   . Myocardial infarction [facility]) [place]  . Osteoarthritis   . Syncope         Past Surgical History:  Procedure Laterality Date  . APPENDECTOMY    . CATARACT EXTRACTION W/PHACO  08/04/2011   Procedure: CATARACT EXTRACTION PHACO AND INTRAOCULAR LENS PLACEMENT (IOC); Surgeon: Tonny Branch; Location: [facility]; Service: Ophthalmology; Laterality: Right; CDE=18.53  . CATARACT EXTRACTION W/PHACO  08/25/2011   Procedure: CATARACT EXTRACTION PHACO AND INTRAOCULAR LENS PLACEMENT (IOC); Surgeon: Tonny Branch; Location: [facility]; Service: Ophthalmology; Laterality: Left; CDE:14.76  . TONSILLECTOMY           Allergies  Allergen Reactions  . Bee Venom Anaphylaxis  . Angiotensin Receptor Blockers     Tongue swelling.  . Aspirin     Directed by physician  . Cabbage Other (See Comments)    Certain foods due to gout flares  . Latex Rash        Outpatient [facility]   Medication Sig  . acetaminophen (TYLENOL) 500 MG tablet Take 1,000 mg by mouth 2 (two) times daily. For leg pain and arthritis. May have additional 1000 mg for break through pain  . amLODipine (NORVASC) 2.5 MG tablet Take  2.5 mg by mouth daily.  . Brinzolamide-Brimonidine (SIMBRINZA) 1-0.2 % SUSP Apply to eye. One drop into both eyes three times a day  . Calcium Carbonate-Vitamin D (CALCIUM-VITAMIN D) 500-200 MG-UNIT tablet Take 1 tablet by mouth 2 (two) times daily.  . citalopram (CELEXA) 20 MG tablet Take 20 mg by mouth daily.  . cycloSPORINE (RESTASIS) 0.05 % ophthalmic emulsion Place 1 drop into both eyes 2 (two) times daily.  Marland Kitchen donepezil (ARICEPT) 10 MG tablet Take 10 mg by mouth at bedtime. For dementia  . hydrALAZINE  (APRESOLINE) 25 MG tablet Take 25 mg by mouth 2 (two) times daily.  Marland Kitchen ipratropium-albuterol (DUONEB) 0.5-2.5 (3) MG/3ML SOLN Take 3 mLs by nebulization every 6 (six) hours as needed.  . loratadine (CLARITIN) 10 MG tablet Take 10 mg by mouth daily.  Marland Kitchen LORazepam (ATIVAN) 0.5 MG tablet Take 1 tablet (0.5 mg total) by mouth 2 (two) times daily.  Marland Kitchen lovastatin (MEVACOR) 40 MG tablet Take 40 mg by mouth at bedtime.  . nitroGLYCERIN (NITROSTAT) 0.4 MG SL tablet Place 0.4 mg under the tongue every 5 (five) minutes as needed for chest pain.   . pantoprazole (PROTONIX) 40 MG tablet Take 40 mg by mouth daily.   . Travoprost, BAK Free, (TRAVATAN) 0.004 % SOLN ophthalmic solution Place 1 drop into both eyes at bedtime. For glaucoma    She is now on Colace 100 mg by mouth twice a day   Review of systems.  In general she is not complaining of fever chills weight it actually appears to be going down.  Skin does not complain of rashes itching or diaphoresis.  Head ears ey-does not complaing of visual cr sore throat.  Respiratory denies shortness of breath or cough.  Cardiac does not complain of chest pain thinks her edema has improved.  GI does not complain of nausea vomiti diarrhea-apparently  constipation has improved since Colace was started.  GU does not complain of dysuria  Musculoskeletal at times will complain of leg pain but is not really complaining of joint pain today.  Neurologic does not complain of dizziness headache or numbness or syncope.  Psych does have some history of anxiety appearss to be able this evening she is not complainingof of anxiety nursing staff does not really report any recent issues .     Physical Exam   She is afebrile pulse of 80 respirations of 17 blood pressure taken manually 160/80 weight is 183y   In general this is a pleasant elderly female in no distress sitting comfortably in her wheelchair she is wearing oxygen.  Her skin is warm and  dry she have baseline lower exemities  Venous stasis changes I do not see evidence of increased erythema  Oropharynx is clear mucous membranes moist.  Chest Clear to auscultation with just minimal slight expiratory wheezing diffuse which is not new  There is no labored breathing.  Heart is regular rate and rhythm -- she has chronic venous stasis changes lower extremity edema which appears relatively unchanged.  Her abdomen is obese soft nontender with active bowel sounds.  Muscle skeletal moves all extremities at baseline and ambulates in a wheelchair at baseline strength appears to be intact all 4 extremities.  Neurologic --no lateralizing findings-cranial nerves intact---speech  is clear.  Psych she is largely alert and oriented  Continues to be pleasant and appropriate  Labs.  06/28/2017 BNP-319  06/05/2016.  Sodium 140 potassium 4.1 BUN 26 creatinine 1.15  WBC 4.8 hemoglobin 10.8 platelets 234  Assessment and plan.  #1-weight gain-this appears actually to be trending back down although this may be somewhat scale variation--edema does appear tobe stable---she continues to be quite adamant t she is not going to take  The Lasix--will discontinue --and monitor- In regards to hypoxia this appears to be stablel butcontinues per nursing to fail trials off oxygen  #2-hypertension-listed readings continue to be somewhat high-I have asked nursing to get an HS reading  To make sure she doesn't low in the evening-if this is stable suspect we will increase her Norvasc  #3 constipation-this appears to haveimproved with addition of Colace  [id number]

## 2017-07-07 ENCOUNTER — Encounter (HOSPITAL_COMMUNITY)
Admission: RE | Admit: 2017-07-07 | Discharge: 2017-07-07 | Disposition: A | Discharge status: Home / Self Care | Payer: Medicare Other | Source: Skilled Nursing Facility | Attending: Internal Medicine | Admitting: Internal Medicine

## 2017-07-07 DIAGNOSIS — I509 Heart failure, unspecified: Secondary | ICD-10-CM | POA: Insufficient documentation

## 2017-07-07 DIAGNOSIS — J189 Pneumonia, unspecified organism: Secondary | ICD-10-CM | POA: Diagnosis not present

## 2017-07-07 DIAGNOSIS — N390 Urinary tract infection, site not specified: Secondary | ICD-10-CM | POA: Diagnosis not present

## 2017-07-07 DIAGNOSIS — B370 Candidal stomatitis: Secondary | ICD-10-CM | POA: Insufficient documentation

## 2017-07-07 DIAGNOSIS — B9620 Unspecified Escherichia coli [E. coli] as the cause of diseases classified elsewhere: Secondary | ICD-10-CM | POA: Insufficient documentation

## 2017-07-07 LAB — BASIC METABOLIC PANEL
ANION GAP: 8 (ref 5–15)
BUN: 18 mg/dL (ref 6–20)
CALCIUM: 9.3 mg/dL (ref 8.9–10.3)
CO2: 32 mmol/L (ref 22–32)
Chloride: 98 mmol/L — ABNORMAL LOW (ref 101–111)
Creatinine, Ser: 0.98 mg/dL (ref 0.44–1.00)
GFR, EST AFRICAN AMERICAN: 56 mL/min — AB (ref >60)
GFR, EST NON AFRICAN AMERICAN: 48 mL/min — AB (ref >60)
Glucose, Bld: 87 mg/dL (ref 65–99)
Potassium: 3.6 mmol/L (ref 3.5–5.1)
Sodium: 138 mmol/L (ref 135–145)

## 2017-07-10 DIAGNOSIS — F428 Other obsessive-compulsive disorder: Secondary | ICD-10-CM | POA: Diagnosis not present

## 2017-07-10 DIAGNOSIS — F411 Generalized anxiety disorder: Secondary | ICD-10-CM | POA: Diagnosis not present

## 2017-07-10 DIAGNOSIS — F320 Major depressive disorder, single episode, mild: Secondary | ICD-10-CM | POA: Diagnosis not present

## 2017-07-17 DIAGNOSIS — F428 Other obsessive-compulsive disorder: Secondary | ICD-10-CM | POA: Diagnosis not present

## 2017-07-17 DIAGNOSIS — F320 Major depressive disorder, single episode, mild: Secondary | ICD-10-CM | POA: Diagnosis not present

## 2017-07-17 DIAGNOSIS — F411 Generalized anxiety disorder: Secondary | ICD-10-CM | POA: Diagnosis not present

## 2017-07-21 ENCOUNTER — Other Ambulatory Visit: Payer: Self-pay

## 2017-07-21 MED ORDER — LORAZEPAM 0.5 MG PO TABS: 0.5000 mg | ORAL_TABLET | Freq: Two times a day (BID) | ORAL | 0 refills | Status: DC

## 2017-07-21 NOTE — Telephone Encounter (Signed)
RX Fax for [facility]@ [phone]  

## 2017-07-24 DIAGNOSIS — F4310 Post-traumatic stress disorder, unspecified: Secondary | ICD-10-CM | POA: Diagnosis not present

## 2017-07-24 DIAGNOSIS — F428 Other obsessive-compulsive disorder: Secondary | ICD-10-CM | POA: Diagnosis not present

## 2017-07-24 DIAGNOSIS — F411 Generalized anxiety disorder: Secondary | ICD-10-CM | POA: Diagnosis not present

## 2017-07-24 DIAGNOSIS — F320 Major depressive disorder, single episode, mild: Secondary | ICD-10-CM | POA: Diagnosis not present

## 2017-08-01 DIAGNOSIS — F320 Major depressive disorder, single episode, mild: Secondary | ICD-10-CM | POA: Diagnosis not present

## 2017-08-01 DIAGNOSIS — F4310 Post-traumatic stress disorder, unspecified: Secondary | ICD-10-CM | POA: Diagnosis not present

## 2017-08-01 DIAGNOSIS — F428 Other obsessive-compulsive disorder: Secondary | ICD-10-CM | POA: Diagnosis not present

## 2017-08-01 DIAGNOSIS — F411 Generalized anxiety disorder: Secondary | ICD-10-CM | POA: Diagnosis not present

## 2017-08-04 ENCOUNTER — Non-Acute Institutional Stay (SKILLED_NURSING_FACILITY): Payer: Medicare Other | Admitting: Internal Medicine

## 2017-08-04 DIAGNOSIS — R0902 Hypoxemia: Secondary | ICD-10-CM | POA: Diagnosis not present

## 2017-08-04 DIAGNOSIS — I10 Essential (primary) hypertension: Secondary | ICD-10-CM | POA: Diagnosis not present

## 2017-08-04 DIAGNOSIS — I5032 Chronic diastolic (congestive) heart failure: Secondary | ICD-10-CM

## 2017-08-04 NOTE — Progress Notes (Signed)
This is an acute visit.  Level of care skilled.  Facility is [facility].  Chief complaint-acute visit follow-up hypertension-weight gain.  History of present illness.  Patient is a pleasant 83 year old female with a history of hypertension as well as diastolic CHF hypoxia chronic kidney disease dementia.  She is recently been seen for hypoxia and actually had a  slightly elevated d-dimer however subsequent CT scan was negative.  She continued to have some hypoxia requiring oxygen most the time-however this has significantly improved O2 sats have been consistently in the 90s largely off oxygen-apparently she still does use it some at night.  She is not complaining of any shortness of breath.  She does have a history of diastolic CHF and at one point had again some weight- however the weight is trending down her weight is now 184 pounds which is down about 6 pounds since [place] had been on Lasix but now refuses and has for some time nonetheless her weight has trended down.  Her BNP was elevated at somewhat over 300 at one point.  She also has a history of hypertension however there was concern because that time she had hypotensive readings later in the day-we did reduce her hydralazine she is now on hydralazine 25 mg twice daily-secondary somewhat rising readings she was started on low-dose Norvasc-  Blood pressures continue to be somewhat elevated 160/70 manually this evening as he previous readings 173/68- 185/78 148/69 133/58 however the trend appears to be fairly consistently high systolics in the 767-341 range.  She is not complaining of any chest pain dizziness headache or palpitations.  Past Medical History:  Diagnosis Date  . Anxiety   . Cellulitis 2016  . CHF (congestive heart failure) ([place])   . Chronic kidney disease   . Dementia   . Dysphagia   . Gait abnormality   . GERD (gastroesophageal reflux disease)   . Glaucoma   . Gout   . Hyperlipidemia     . Hypertension   . Muscle weakness   . Myocardial infarction [facility]) [place]  . Osteoarthritis   . Syncope         Past Surgical History:  Procedure Laterality Date  . APPENDECTOMY    . CATARACT EXTRACTION W/PHACO  08/04/2011   Procedure: CATARACT EXTRACTION PHACO AND INTRAOCULAR LENS PLACEMENT (IOC); Surgeon: Tonny Branch; Location: [facility]; Service: Ophthalmology; Laterality: Right; CDE=18.53  . CATARACT EXTRACTION W/PHACO  08/25/2011   Procedure: CATARACT EXTRACTION PHACO AND INTRAOCULAR LENS PLACEMENT (IOC); Surgeon: Tonny Branch; Location: [facility]; Service: Ophthalmology; Laterality: Left; CDE:14.76  . TONSILLECTOMY           Allergies  Allergen Reactions  . Bee Venom Anaphylaxis  . Angiotensin Receptor Blockers     Tongue swelling.  . Aspirin     Directed by physician  . Cabbage Other (See Comments)    Certain foods due to gout flares  . Latex Rash        Outpatient [facility]   Medication Sig  . acetaminophen (TYLENOL) 500 MG tablet Take 1,000 mg by mouth 2 (two) times daily. For leg pain and arthritis. May have additional 1000 mg for break through pain  . amLODipine (NORVASC) 2.5 MG tablet Take 2.5 mg by mouth daily.  . Brinzolamide-Brimonidine (SIMBRINZA) 1-0.2 % SUSP Apply to eye. One drop into both eyes three times a day  . Calcium Carbonate-Vitamin D (CALCIUM-VITAMIN D) 500-200 MG-UNIT tablet Take 1 tablet by mouth 2 (two) times daily.  Marland Kitchen  citalopram (CELEXA) 20 MG tablet Take 20 mg by mouth daily.  . cycloSPORINE (RESTASIS) 0.05 % ophthalmic emulsion Place 1 drop into both eyes 2 (two) times daily.  Marland Kitchen donepezil (ARICEPT) 10 MG tablet Take 10 mg by mouth at bedtime. For dementia  . hydrALAZINE (APRESOLINE) 25 MG tablet Take 25 mg by mouth 2 (two) times daily.  Marland Kitchen ipratropium-albuterol (DUONEB) 0.5-2.5 (3) MG/3ML SOLN Take 3 mLs by nebulization every 6 (six) hours as needed.  . loratadine (CLARITIN) 10 MG tablet Take  10 mg by mouth daily.  Marland Kitchen LORazepam (ATIVAN) 0.5 MG tablet Take 1 tablet (0.5 mg total) by mouth 2 (two) times daily.  Marland Kitchen lovastatin (MEVACOR) 40 MG tablet Take 40 mg by mouth at bedtime.  . nitroGLYCERIN (NITROSTAT) 0.4 MG SL tablet Place 0.4 mg under the tongue every 5 (five) minutes as needed for chest pain.   . pantoprazole (PROTONIX) 40 MG tablet Take 40 mg by mouth daily.   . Travoprost, BAK Free, (TRAVATAN) 0.004 % SOLN ophthalmic solution Place 1 drop into both eyes at bedtime. For glaucoma    She is now on Colace 100 mg by mouth twice a day   Review of systems.     In general she is not complaining any fever chills weight appears to have trended down.  Skin does not complain a rash itching or diaphoresis does have venous stasis changes lower extremities.  Head ears eyes nose mouth and throat is not complaining of headache or visual changes this evening of sore throat.  Respiratory does not complain of shortness of breath or cough occasionally uses oxygen at night but this is gotten much better.  Cardiac does not complain of any chest pain has chronic venous stasis changes lower extremity edema.  He is not complaining of any abdominal discomfort nausea vomiting diarrhea constipation does have a history of GERD.  Musculoskeletal does not really complain of joint pain this evening appears to be good spirits ambulating about the hallway.  Neurologic does not complain of dizziness headache syncope or numbness.  Psych history of anxiety which appears to be well controlled appears to be in good spirits glad that she does not need oxygen all the time  Physical exam.  She is afebrile pulse of 64 respirations 18 blood pressure taken manually 160/70 weight is 184 pounds.  General this is a pleasant elderly female no distress sitting comfortably in her wheelchair.  Her skin is warm and dry she continues with venous stasis changes lower extremities at baseline.  Oropharynx clear  mucous membranes moist.  Sclera and conjunctive are clear visual acuity appears grossly intact.  Chest is clear to auscultation with somewhat shallow air entry there is no labored breathing.  Heart is regular rate and rhythm without murmur gallop or rub again does have chronic edema and venous stasis changes this appears relatively baseline with previous exams.  Her abdomen is obese soft nontender with active bowel sounds.  Musculoskeletal continues to ambulate in wheelchair moves all her extremities at baseline.  Neurologic is grossly intact her speech is clear no lateralizing findings.  Psych she is alert largely oriented pleasant appropriate.  Labs.  July 07, 2017.  Sodium 138 potassium 3.6 BUN 18 creatinine 0.98.  June 05, 2017.  WBC 4.8 hemoglobin 10.8 platelets 234.  Assessment plan.  1.  Hypertension-she appears to have fairly consistent elevated readings- will increase her Norvasc up to 5 mg a day continue hydralazine twice daily at 25 mg- this may need further  titration but with a previous history of hypotension will be cautious here continue to monitor readings 3 times a day   #2-GURKYHC of diastolic CHF again her weight appears to be trending down despite being off the Lasix clinically she appears to be stable will monitor.  3.  Hypoxia this has improved as well she is now only requiring oxygen occasionally at night again CT scan did not show any pulmonary embolism-she appears to be doing well in this regards  [id number]

## 2017-08-08 ENCOUNTER — Encounter: Payer: Self-pay | Admitting: Internal Medicine

## 2017-08-08 ENCOUNTER — Non-Acute Institutional Stay (SKILLED_NURSING_FACILITY): Payer: Medicare Other | Admitting: Internal Medicine

## 2017-08-08 DIAGNOSIS — I10 Essential (primary) hypertension: Secondary | ICD-10-CM

## 2017-08-08 DIAGNOSIS — E785 Hyperlipidemia, unspecified: Secondary | ICD-10-CM | POA: Diagnosis not present

## 2017-08-08 DIAGNOSIS — F411 Generalized anxiety disorder: Secondary | ICD-10-CM

## 2017-08-08 DIAGNOSIS — I5032 Chronic diastolic (congestive) heart failure: Secondary | ICD-10-CM

## 2017-08-08 DIAGNOSIS — F320 Major depressive disorder, single episode, mild: Secondary | ICD-10-CM | POA: Diagnosis not present

## 2017-08-08 DIAGNOSIS — D649 Anemia, unspecified: Secondary | ICD-10-CM

## 2017-08-08 DIAGNOSIS — F0391 Unspecified dementia with behavioral disturbance: Secondary | ICD-10-CM | POA: Diagnosis not present

## 2017-08-08 DIAGNOSIS — N183 Chronic kidney disease, stage 3 unspecified: Secondary | ICD-10-CM

## 2017-08-08 DIAGNOSIS — F428 Other obsessive-compulsive disorder: Secondary | ICD-10-CM | POA: Diagnosis not present

## 2017-08-08 NOTE — Progress Notes (Signed)
Location:   [facility] Room Number: 122/D Place of Service:  SNF (31) Provider:  Malen Gauze, Anthoney Harada, PA-C  Patient Care Team: [name] as PCP - General (Internal Medicine) [name], MD as Consulting Physician (Geriatric Medicine)  Extended Emergency Contact Information Primary Emergency Contact: [name], [place] [address] [name] of [place] Phone: [phone] Relation: Daughter Secondary Emergency Contact: [name] States of [place] Phone: [phone] Relation: Son  Code Status:  DNR Goals of care: Advanced Directive information Advanced Directives 08/08/2017  Does Patient Have a Medical Advance Directive? Yes  Type of Advance Directive Out of facility DNR (pink MOST or yellow form)  Does patient want to make changes to medical advance directive? No - Patient declined  Copy of [place] in Chart? No - copy requested  Pre-existing out of facility DNR order (yellow form or pink MOST form) -     Chief Complaint  Patient presents with  . Medical Management of Chronic Issues    Routine Visit    HPI:  Pt is a 83 y.o. female seen today for medical management of chronic diseases.    Patient has H/o Hypertension, Distal DVT, Chronic venous stasis, Diastolic CHF chronic Renal disaese and GERD ,Glaucoma, Dementia and Anxiety  Patient in 10/10 had episode of shortness of breath.  She had a CT scan of her chest which showed right upper lobe pneumonia.    She was treated with antibiotics.  Her dyspnea did not get better and she still continues to need Oxygen.    Due to her increased swelling the legs and weight gain with high BNP she was started on Lasix.  Patient has refused Lasix  consistently.  She told me today she would rather die than take Lasix.   She continues to to have shortness of breath and cough.  Her weight today was 184 pounds which is Stable. Her  other problem is labile blood pressure.  It has varied from systolic 130-865.  Recently for the past few months her Systolic blood pressure has been consistently 160-180.  Her Norvasc which was started is now up to 5 mg Patient was complaining of headache today she denied any chest pain.Marland Kitchen Her appetite is good.  Her mood seems better.  And there are no other nursing issues   Past Medical History:  Diagnosis Date  . Anxiety   . Cellulitis 2016  . CHF (congestive heart failure) ([place])   . Chronic kidney disease   . Dementia   . Dysphagia   . Gait abnormality   . GERD (gastroesophageal reflux disease)   . Glaucoma   . Gout   . Hyperlipidemia   . Hypertension   . Muscle weakness   . Myocardial infarction [facility]) [place]  . Osteoarthritis   . Syncope    Past Surgical History:  Procedure Laterality Date  . APPENDECTOMY    . CATARACT EXTRACTION W/PHACO  08/04/2011   Procedure: CATARACT EXTRACTION PHACO AND INTRAOCULAR LENS PLACEMENT (IOC);  Surgeon: Tonny Branch;  Location: [facility];  Service: Ophthalmology;  Laterality: Right;  CDE=18.53  . CATARACT EXTRACTION W/PHACO  08/25/2011   Procedure: CATARACT EXTRACTION PHACO AND INTRAOCULAR LENS PLACEMENT (IOC);  Surgeon: Tonny Branch;  Location: [facility];  Service: Ophthalmology;  Laterality: Left;  CDE:14.76  . TONSILLECTOMY      Allergies  Allergen Reactions  . Bee  Venom Anaphylaxis  . Angiotensin Receptor Blockers     Tongue swelling.  . Aspirin     Directed by physician  . Cabbage Other (See Comments)    Certain foods due to gout flares  . Latex Rash    Outpatient Encounter Medications as of 08/08/2017  Medication Sig  . acetaminophen (TYLENOL) 500 MG tablet Take 1,000 mg by mouth 2 (two) times daily. For leg pain and arthritis. May have additional 1000 mg for break through pain  . amLODipine (NORVASC) 5 MG tablet Take 5 mg by mouth daily.  . Brinzolamide-Brimonidine (SIMBRINZA) 1-0.2 % SUSP Apply to eye. One  drop into both  eyes three times a day  . Calcium Carbonate-Vitamin D (CALCIUM-VITAMIN D) 500-200 MG-UNIT tablet Take 1 tablet by mouth 2 (two) times daily.  . citalopram (CELEXA) 20 MG tablet Take 20 mg by mouth daily.  . cycloSPORINE (RESTASIS) 0.05 % ophthalmic emulsion Place 1 drop into both eyes 2 (two) times daily.  Marland Kitchen docusate sodium (COLACE) 100 MG capsule Take 100 mg by mouth 2 (two) times daily.  Marland Kitchen donepezil (ARICEPT) 10 MG tablet Take 10 mg by mouth at bedtime. For dementia  . hydrALAZINE (APRESOLINE) 25 MG tablet Take 25 mg by mouth 2 (two) times daily.  Marland Kitchen ipratropium-albuterol (DUONEB) 0.5-2.5 (3) MG/3ML SOLN Take 3 mLs by nebulization every 6 (six) hours as needed.  . loratadine (CLARITIN) 10 MG tablet Take 10 mg by mouth daily.  Marland Kitchen LORazepam (ATIVAN) 0.5 MG tablet Take 1 tablet (0.5 mg total) by mouth 2 (two) times daily.  Marland Kitchen lovastatin (MEVACOR) 40 MG tablet Take 40 mg by mouth at bedtime.  . nitroGLYCERIN (NITROSTAT) 0.4 MG SL tablet Place 0.4 mg under the tongue every 5 (five) minutes as needed for chest pain.   . pantoprazole (PROTONIX) 40 MG tablet Take 40 mg by mouth daily.   . Travoprost, BAK Free, (TRAVATAN) 0.004 % SOLN ophthalmic solution Place 1 drop into both eyes at bedtime. For glaucoma  . [DISCONTINUED] amLODipine (NORVASC) 2.5 MG tablet Take 2.5 mg by mouth daily.  . [DISCONTINUED] nystatin (MYCOSTATIN) 100000 UNIT/ML suspension Take 5 mLs by mouth 4 (four) times daily.   No facility-administered encounter medications on file as of 08/08/2017.      Review of Systems  Review of Systems  Constitutional: Negative for activity change, appetite change, chills, diaphoresis, fatigue and fever.  HENT: Negative for mouth sores, postnasal drip, rhinorrhea, sinus pain and sore throat.   Respiratory: Negative for apnea,  chest tightness, Positive for shortness of breath    Cardiovascular: Negative for chest pain, palpitations and positive for leg swelling.  Gastrointestinal: Negative for  abdominal distention, abdominal pain, positive for constipation, .  Genitourinary: Negative for dysuria and frequency.  Musculoskeletal: Negative for arthralgias, joint swelling and myalgias.  Skin: Negative for rash.  Neurological: Negative for dizziness, syncope, weakness, light-headedness and numbness.  Psychiatric/Behavioral: Negative for behavioral problems, confusion and sleep disturbance.     Immunization History  Administered Date(s) Administered  . Influenza Whole 06/11/2007, 10/16/2008, 05/21/2009, 05/05/2010  . Influenza-Unspecified 06/05/2014, 06/02/2016, 06/02/2017, 06/02/2017  . Pneumococcal Conjugate-13 06/06/2016  . Pneumococcal Polysaccharide-23 06/11/2007, 06/01/2016  . Pneumococcal-Unspecified 06/06/2016  . Td 06/11/2007  . Tdap 06/24/2017  . Zoster 05/21/2007   Pertinent  Health Maintenance Due  Topic Date Due  . PNA vac Low Risk Adult (2 of 2 - PCV13) 06/06/2025 (Originally 06/06/2017)  . INFLUENZA VACCINE  Completed  . DEXA SCAN  Completed   Fall Risk  05/22/2017  Falls in the past year? No   Functional Status Survey:    Vitals:   08/08/17 1558  BP: (!) 199/85 Repeat Manually was 200/110  Pulse: 69  Resp: 18  Temp: 98.7 F (37.1 C)  TempSrc: Oral  SpO2: 92%  Weight: 184 lb 6.4 oz (83.6 kg)   Body mass index is 37.24 kg/m. Physical Exam  Constitutional: She is oriented to person, place, and time. She appears well-developed and well-nourished.  HENT:  Head: Normocephalic.  Mouth/Throat: Oropharynx is clear and moist.  Eyes: Pupils are equal, round, and reactive to light.  Neck: Neck supple.  Cardiovascular: Normal rate and normal heart sounds.  Pulmonary/Chest: Effort normal and breath sounds normal. No respiratory distress. She has no wheezes. She has no rales.  Abdominal: Soft. Bowel sounds are normal. She exhibits no distension. There is no tenderness. There is no rebound.  Musculoskeletal:  Has Chronic venous Stasis with Moderate Edema'    Lymphadenopathy:    She has no cervical adenopathy.  Neurological: She is alert and oriented to person, place, and time.  Skin: Skin is warm and dry.  Psychiatric: She has a normal mood and affect. Her behavior is normal. Thought content normal.    Labs reviewed: Recent Labs    05/19/17 0800 06/05/17 0700 07/07/17 0706  NA 139 140 138  K 4.1 4.1 3.6  CL 102 102 98*  CO2 29 28 32  GLUCOSE 85 87 87  BUN 37* 26* 18  CREATININE 1.22* 1.15* 0.98  CALCIUM 9.4 9.1 9.3   Recent Labs    09/27/16 0715  AST 20  ALT 15  ALKPHOS 57  BILITOT 0.5  PROT 7.1  ALBUMIN 3.9   Recent Labs    02/17/17 0707 03/20/17 0600 05/19/17 0800 06/05/17 0700  WBC 4.2 5.4 5.8 4.8  NEUTROABS 2.1  --  3.2 2.7  HGB 11.2* 12.1 11.2* 10.8*  HCT 34.5* 37.6 34.0* 33.7*  MCV 83.5 84.3 86.5 87.3  PLT 211 227 209 234   Lab Results  Component Value Date   TSH 1.454 04/10/2015   Lab Results  Component Value Date   HGBA1C 5.5 03/20/2017   Lab Results  Component Value Date   CHOL 215 (H) 02/17/2017   HDL 103 02/17/2017   LDLCALC 99 02/17/2017   TRIG 66 02/17/2017   CHOLHDL 2.1 02/17/2017    Significant Diagnostic Results in last 30 days:  No results found.  Assessment/Plan  Hypertensive Urgency Patient is complaining of headache and her systolic blood pressure was above 200 checked by me manually.  We gave her 0.1 mg of clonidine.  We will Recheck her blood pressure in an hour. Will also start patient on hydrochlorothiazide 12.5 mg every morning. We will check her blood pressure every shift for next 3 days.  Discussed with the patient she is not very happy to take any new medicine.  If she refuses then we will try to increase her hydralazine.   Chronic diastolic congestive heart failure  Her weight is stable.  She does have some swelling in her legs. She continues to refuse Lasix  Stage 3 chronic kidney disease  Creatinine is staying stable.   Will repeat BMP  Anxiety state   patient is doing well She is on Celexa and Ativan.   Any titration can make her symptoms recur  Hyperlipidemia, LDL 99 6/18 on lovastatin  Dementia  stable on Aricept  Anemia Her hemoglobin has been slowly drifting down from 12.1 in 07/18 to 10.8  now We will repeat CBC and do iron studies. Family/ staff Communication:   Labs/tests ordered:

## 2017-08-09 ENCOUNTER — Encounter: Payer: Self-pay | Admitting: Internal Medicine

## 2017-08-09 ENCOUNTER — Non-Acute Institutional Stay (SKILLED_NURSING_FACILITY): Payer: Medicare Other | Admitting: Internal Medicine

## 2017-08-09 DIAGNOSIS — D649 Anemia, unspecified: Secondary | ICD-10-CM

## 2017-08-09 DIAGNOSIS — N183 Chronic kidney disease, stage 3 unspecified: Secondary | ICD-10-CM

## 2017-08-09 DIAGNOSIS — I10 Essential (primary) hypertension: Secondary | ICD-10-CM

## 2017-08-09 DIAGNOSIS — I5032 Chronic diastolic (congestive) heart failure: Secondary | ICD-10-CM | POA: Diagnosis not present

## 2017-08-09 NOTE — Progress Notes (Signed)
Location:   [place] Room Number: 122/D Place of Service:  SNF (31) Provider:  Malen Gauze, Anthoney Harada, PA-C  Patient Care Team: [name] as PCP - General (Internal Medicine) [name], MD as Consulting Physician (Geriatric Medicine)  Extended Emergency Contact Information Primary Emergency Contact: [name], [place] [address] [name] of [place] Phone: [phone] Relation: Daughter Secondary Emergency Contact: [name] States of [place] Phone: [phone] Relation: Son  Code Status:  DNR Goals of care: Advanced Directive information Advanced Directives 08/09/2017  Does Patient Have a Medical Advance Directive? Yes  Type of Advance Directive Out of facility DNR (pink MOST or yellow form)  Does patient want to make changes to medical advance directive? No - Patient declined  Copy of [place] in Chart? No - copy requested  Pre-existing out of facility DNR order (yellow form or pink MOST form) -     Chief Complaint  Patient presents with  . Acute Visit    F/U Elevated BP    HPI:  Pt is a 82 y.o. female seen today for an acute visit for Elevated BP.  Patient has H/o Hypertension, Distal DVT, Chronic venous stasis,  Diastolic CHF chronic Renal disaese and GERD,Glaucoma,Dementia and Anxiety  Patient was seen for routine visit yesterday and her systolic blood pressure was up to 210.  She received 1 dose of clonidine.  And I had started her on low-dose of hydrochlorothiazide.  She is already on amlodipine 5 mg and hydralazine 25 mg twice daily.  Patient has a very labile blood pressure.  This was a follow-up visit.  Patient denies any headache today.  She said she was just tired.  She does continue to have shortness of breath and cough and has repeatedly refused to take Lasix.  Past Medical History:  Diagnosis Date  . Anxiety   . Cellulitis 2016  . CHF  (congestive heart failure) ([place])   . Chronic kidney disease   . Dementia   . Dysphagia   . Gait abnormality   . GERD (gastroesophageal reflux disease)   . Glaucoma   . Gout   . Hyperlipidemia   . Hypertension   . Muscle weakness   . Myocardial infarction [facility]) [place]  . Osteoarthritis   . Syncope    Past Surgical History:  Procedure Laterality Date  . APPENDECTOMY    . CATARACT EXTRACTION W/PHACO  08/04/2011   Procedure: CATARACT EXTRACTION PHACO AND INTRAOCULAR LENS PLACEMENT (IOC);  Surgeon: Tonny Branch;  Location: [facility];  Service: Ophthalmology;  Laterality: Right;  CDE=18.53  . CATARACT EXTRACTION W/PHACO  08/25/2011   Procedure: CATARACT EXTRACTION PHACO AND INTRAOCULAR LENS PLACEMENT (IOC);  Surgeon: Tonny Branch;  Location: [facility];  Service: Ophthalmology;  Laterality: Left;  CDE:14.76  . TONSILLECTOMY      Allergies  Allergen Reactions  . Bee Venom Anaphylaxis  . Angiotensin Receptor Blockers     Tongue swelling.  . Aspirin     Directed by physician  . Cabbage Other (See Comments)    Certain foods due to gout flares  . Latex Rash    Outpatient Encounter Medications as of 08/09/2017  Medication Sig  . acetaminophen (TYLENOL) 500 MG tablet Take 1,000 mg by mouth 2 (two) times daily. For leg pain and arthritis. May have additional 1000 mg for break through pain  . amLODipine (NORVASC) 5 MG  tablet Take 5 mg by mouth daily.  . Brinzolamide-Brimonidine (SIMBRINZA) 1-0.2 % SUSP Apply to eye. One  drop into both eyes three times a day  . Calcium Carbonate-Vitamin D (CALCIUM-VITAMIN D) 500-200 MG-UNIT tablet Take 1 tablet by mouth 2 (two) times daily.  . citalopram (CELEXA) 20 MG tablet Take 20 mg by mouth daily.  . cycloSPORINE (RESTASIS) 0.05 % ophthalmic emulsion Place 1 drop into both eyes 2 (two) times daily.  Marland Kitchen docusate sodium (COLACE) 100 MG capsule Take 100 mg by mouth 2 (two) times daily.  Marland Kitchen donepezil (ARICEPT) 10 MG tablet Take 10 mg by mouth at bedtime.  For dementia  . hydrALAZINE (APRESOLINE) 25 MG tablet Take 25 mg by mouth 2 (two) times daily.  Marland Kitchen ipratropium-albuterol (DUONEB) 0.5-2.5 (3) MG/3ML SOLN Take 3 mLs by nebulization every 6 (six) hours as needed.  . loratadine (CLARITIN) 10 MG tablet Take 10 mg by mouth daily.  Marland Kitchen LORazepam (ATIVAN) 0.5 MG tablet Take 1 tablet (0.5 mg total) by mouth 2 (two) times daily.  Marland Kitchen lovastatin (MEVACOR) 40 MG tablet Take 40 mg by mouth at bedtime.  . nitroGLYCERIN (NITROSTAT) 0.4 MG SL tablet Place 0.4 mg under the tongue every 5 (five) minutes as needed for chest pain.   . pantoprazole (PROTONIX) 40 MG tablet Take 40 mg by mouth daily.   . Travoprost, BAK Free, (TRAVATAN) 0.004 % SOLN ophthalmic solution Place 1 drop into both eyes at bedtime. For glaucoma   No facility-administered encounter medications on file as of 08/09/2017.      Review of Systems  Review of Systems  Constitutional: Negative for activity change, appetite change, chills, diaphoresis, fatigue and fever.  HENT: Negative for mouth sores, postnasal drip, rhinorrhea, sinus pain and sore throat.   Respiratory: Negative for apnea, cough, chest tightness, shortness of breath and wheezing.   Cardiovascular: Negative for chest pain, palpitations and leg swelling.  Gastrointestinal: Negative for abdominal distention, abdominal pain, constipation, diarrhea, nausea and vomiting.  Genitourinary: Negative for dysuria and frequency.  Musculoskeletal: Negative for arthralgias, joint swelling and myalgias.  Skin: Negative for rash.  Neurological: Negative for dizziness, syncope, weakness, light-headedness and numbness.  Psychiatric/Behavioral: Negative for behavioral problems, confusion and sleep disturbance.     Immunization History  Administered Date(s) Administered  . Influenza Whole 06/11/2007, 10/16/2008, 05/21/2009, 05/05/2010  . Influenza-Unspecified 06/05/2014, 06/02/2016, 06/02/2017, 06/02/2017  . Pneumococcal Conjugate-13  06/06/2016  . Pneumococcal Polysaccharide-23 06/11/2007, 06/01/2016  . Pneumococcal-Unspecified 06/06/2016  . Td 06/11/2007  . Tdap 06/24/2017  . Zoster 05/21/2007   Pertinent  Health Maintenance Due  Topic Date Due  . PNA vac Low Risk Adult (2 of 2 - PCV13) 06/06/2025 (Originally 06/06/2017)  . INFLUENZA VACCINE  Completed  . DEXA SCAN  Completed   Fall Risk  05/22/2017  Falls in the past year? No   Functional Status Survey:    Vitals:   08/09/17 1208  BP: (!) 180/84  Pulse: 64  Resp: 18  Temp: 98.1 F (36.7 C)  TempSrc: Oral  SpO2: 93%   There is no height or weight on file to calculate BMI. Physical Exam  Constitutional: She appears well-developed and well-nourished.  HENT:  Head: Normocephalic.  Mouth/Throat: Oropharynx is clear and moist.  Eyes: Pupils are equal, round, and reactive to light.  Neck: Neck supple.  Cardiovascular: Normal rate and normal heart sounds.  Pulmonary/Chest: Effort normal and breath sounds normal. No respiratory distress. She has no wheezes.  Abdominal: Soft. Bowel sounds are normal.  Musculoskeletal: She  exhibits edema.  Skin: Skin is warm and dry.    Labs reviewed: Recent Labs    05/19/17 0800 06/05/17 0700 07/07/17 0706  NA 139 140 138  K 4.1 4.1 3.6  CL 102 102 98*  CO2 29 28 32  GLUCOSE 85 87 87  BUN 37* 26* 18  CREATININE 1.22* 1.15* 0.98  CALCIUM 9.4 9.1 9.3   Recent Labs    09/27/16 0715  AST 20  ALT 15  ALKPHOS 57  BILITOT 0.5  PROT 7.1  ALBUMIN 3.9   Recent Labs    02/17/17 0707 03/20/17 0600 05/19/17 0800 06/05/17 0700  WBC 4.2 5.4 5.8 4.8  NEUTROABS 2.1  --  3.2 2.7  HGB 11.2* 12.1 11.2* 10.8*  HCT 34.5* 37.6 34.0* 33.7*  MCV 83.5 84.3 86.5 87.3  PLT 211 227 209 234   Lab Results  Component Value Date   TSH 1.454 04/10/2015   Lab Results  Component Value Date   HGBA1C 5.5 03/20/2017   Lab Results  Component Value Date   CHOL 215 (H) 02/17/2017   HDL 103 02/17/2017   LDLCALC 99  02/17/2017   TRIG 66 02/17/2017   CHOLHDL 2.1 02/17/2017    Significant Diagnostic Results in last 30 days:  No results found.  Assessment/Plan  Essential hypertension Patient was started on 12.5 mg of hydrochlorothiazide and her blood pressure is a little better today Discussed with patient and she agrees to increase her hydralazine to 25 mg 3 times daily we will continue her on amlodipine 5 mg daily and HCTZ Chronic diastolic congestive heart failure  Her weight is stable.  She does have some swelling in her legs. She continues to refuse Lasix. Has agreed for HCTZ.  Stage 3 chronic kidney disease  Creatinine is staying stable.   Will repeat BMP   Hyperlipidemia, LDL 99 6/18 on lovastatin  Dementia  stable on Aricept  Anemia Her hemoglobin has been slowly drifting down from 12.1 in 07/18 to 10.8 now We will repeat CBC and do iron studies     Family/ staff Communication:   Labs/tests ordered:

## 2017-08-15 ENCOUNTER — Encounter (HOSPITAL_COMMUNITY)
Admission: RE | Admit: 2017-08-15 | Discharge: 2017-08-15 | Disposition: A | Discharge status: Home / Self Care | Payer: Medicare Other | Source: Skilled Nursing Facility | Attending: Internal Medicine | Admitting: Internal Medicine

## 2017-08-15 DIAGNOSIS — F339 Major depressive disorder, recurrent, unspecified: Secondary | ICD-10-CM | POA: Diagnosis not present

## 2017-08-15 DIAGNOSIS — R609 Edema, unspecified: Secondary | ICD-10-CM | POA: Insufficient documentation

## 2017-08-15 DIAGNOSIS — F419 Anxiety disorder, unspecified: Secondary | ICD-10-CM | POA: Insufficient documentation

## 2017-08-15 DIAGNOSIS — K210 Gastro-esophageal reflux disease with esophagitis: Secondary | ICD-10-CM | POA: Diagnosis not present

## 2017-08-15 DIAGNOSIS — I509 Heart failure, unspecified: Secondary | ICD-10-CM | POA: Diagnosis not present

## 2017-08-15 DIAGNOSIS — F411 Generalized anxiety disorder: Secondary | ICD-10-CM | POA: Diagnosis not present

## 2017-08-15 DIAGNOSIS — F320 Major depressive disorder, single episode, mild: Secondary | ICD-10-CM | POA: Diagnosis not present

## 2017-08-15 DIAGNOSIS — F428 Other obsessive-compulsive disorder: Secondary | ICD-10-CM | POA: Diagnosis not present

## 2017-08-15 LAB — CBC
HEMATOCRIT: 37.2 % (ref 36.0–46.0)
HEMOGLOBIN: 11.5 g/dL — AB (ref 12.0–15.0)
MCH: 26.3 pg (ref 26.0–34.0)
MCHC: 30.9 g/dL (ref 30.0–36.0)
MCV: 85.1 fL (ref 78.0–100.0)
Platelets: 245 10*3/uL (ref 150–400)
RBC: 4.37 MIL/uL (ref 3.87–5.11)
RDW: 15.7 % — AB (ref 11.5–15.5)
WBC: 4.2 10*3/uL (ref 4.0–10.5)

## 2017-08-15 LAB — BASIC METABOLIC PANEL
ANION GAP: 14 (ref 5–15)
BUN: 26 mg/dL — ABNORMAL HIGH (ref 6–20)
CALCIUM: 9.4 mg/dL (ref 8.9–10.3)
CO2: 27 mmol/L (ref 22–32)
CREATININE: 1.32 mg/dL — AB (ref 0.44–1.00)
Chloride: 100 mmol/L — ABNORMAL LOW (ref 101–111)
GFR calc non Af Amer: 34 mL/min — ABNORMAL LOW (ref >60)
GFR, EST AFRICAN AMERICAN: 39 mL/min — AB (ref >60)
Glucose, Bld: 95 mg/dL (ref 65–99)
Potassium: 3.4 mmol/L — ABNORMAL LOW (ref 3.5–5.1)
SODIUM: 141 mmol/L (ref 135–145)

## 2017-08-15 LAB — IRON AND TIBC
IRON: 36 ug/dL (ref 28–170)
SATURATION RATIOS: 8 % — AB (ref 10.4–31.8)
TIBC: 434 ug/dL (ref 250–450)
UIBC: 398 ug/dL

## 2017-08-15 LAB — FOLATE: Folate: 17.7 ng/mL (ref >5.9)

## 2017-08-15 LAB — FERRITIN: Ferritin: 22 ng/mL (ref 11–307)

## 2017-08-16 ENCOUNTER — Non-Acute Institutional Stay (SKILLED_NURSING_FACILITY): Payer: Medicare Other | Admitting: Internal Medicine

## 2017-08-16 ENCOUNTER — Encounter: Payer: Self-pay | Admitting: Internal Medicine

## 2017-08-16 DIAGNOSIS — E876 Hypokalemia: Secondary | ICD-10-CM | POA: Diagnosis not present

## 2017-08-16 DIAGNOSIS — I5032 Chronic diastolic (congestive) heart failure: Secondary | ICD-10-CM

## 2017-08-16 DIAGNOSIS — I10 Essential (primary) hypertension: Secondary | ICD-10-CM

## 2017-08-16 NOTE — Progress Notes (Signed)
Location:   [facility] Room Number: 122/D Place of Service:  SNF [phone]) Provider:  Mickel Duhamel, PA-C  Patient Care Team: [name] as PCP - General (Internal Medicine) [name], MD as Consulting Physician (Geriatric Medicine)  Extended Emergency Contact Information Primary Emergency Contact: [name], [place] [address] [name] of [employer] Phone: [phone] Relation: Daughter Secondary Emergency Contact: [name] [place] of [place] Phone: [phone] Relation: Son  Code Status:  DNR Goals of care: Advanced Directive information Advanced Directives 08/16/2017  Does Patient Have a Medical Advance Directive? Yes  Type of Advance Directive Out of facility DNR (pink MOST or yellow form)  Does patient want to make changes to medical advance directive? No - Patient declined  Copy of [place] in Chart? No - copy requested  Pre-existing out of facility DNR order (yellow form or pink MOST form) -    Chief complaint-acute visit secondary to hypokalemia as well as follow-up hypertension   HPI:  Pt is a 83 y.o. female seen today for an acute visit for follow-up of hypertension as well as hypokalemia.   she has a history of hypertension as well as distal DVT chronic venous stasis edema with diastolic CHF renal disease and GERD as well as glaucoma dementia and anxiety.  When she was seen last week byDr.Gupta for routine visit she was noted to have an elevated systolic up to 878.  She did receive a dose of clonidine and was started on low-dose hydrochlorothiazide on follow-up visit she still had elevated readings although somewhat improved and hydralazine was increased to 3 times a day she is also on Norvasc 5 mg a day-follow-up blood pressures appear to be somewhat variable ranging and appears from 130s-180s-I do not see consistent elevation in the 160-180 range  --although there is some more consistent elevated readings later in the day   I did take it  manually this afternoon it wawt 130/58  We did do labs yesterday as well that showed renal function stable with a creatinine of 1.32 however potassium was slightly low at 3.4.  She was not on potassium-and we did start 20 mEq for 2 days and then down to 10 mEq a day will have this rechecked later in the week as well as next week.  Clinically she appears to be doing well she is bright alert in good spirits        Past Medical History:  Diagnosis Date  . Anxiety   . Cellulitis 2016  . CHF (congestive heart failure) ([place])   . Chronic kidney disease   . Dementia   . Dysphagia   . Gait abnormality   . GERD (gastroesophageal reflux disease)   . Glaucoma   . Gout   . Hyperlipidemia   . Hypertension   . Muscle weakness   . Myocardial infarction [facility]) [place]  . Osteoarthritis   . Syncope    Past Surgical History:  Procedure Laterality Date  . APPENDECTOMY    . CATARACT EXTRACTION W/PHACO  08/04/2011   Procedure: CATARACT EXTRACTION PHACO AND INTRAOCULAR LENS PLACEMENT (IOC);  Surgeon: Tonny Branch;  Location: [facility];  Service: Ophthalmology;  Laterality: Right;  CDE=18.53  . CATARACT EXTRACTION W/PHACO  08/25/2011   Procedure: CATARACT EXTRACTION PHACO AND INTRAOCULAR LENS PLACEMENT (IOC);  Surgeon: Tonny Branch;  Location: [facility];  Service: Ophthalmology;  Laterality: Left;  CDE:14.76  . TONSILLECTOMY      Allergies  Allergen Reactions  . Bee Venom Anaphylaxis  . Angiotensin Receptor Blockers     Tongue swelling.  . Aspirin     Directed by physician  . Cabbage Other (See Comments)    Certain foods due to gout flares  . Latex Rash    Outpatient Encounter Medications as of 08/16/2017  Medication Sig  . acetaminophen (TYLENOL) 500 MG tablet Take 1,000 mg by mouth 2 (two) times daily. For leg pain and arthritis. May have additional 1000 mg for break through pain  .  amLODipine (NORVASC) 5 MG tablet Take 5 mg by mouth daily.  . Brinzolamide-Brimonidine (SIMBRINZA) 1-0.2 % SUSP Apply to eye. One  drop into both eyes three times a day  . Calcium Carbonate-Vitamin D (CALCIUM-VITAMIN D) 500-200 MG-UNIT tablet Take 1 tablet by mouth 2 (two) times daily.  . citalopram (CELEXA) 20 MG tablet Take 20 mg by mouth daily.  . cycloSPORINE (RESTASIS) 0.05 % ophthalmic emulsion Place 1 drop into both eyes 2 (two) times daily.  Marland Kitchen docusate sodium (COLACE) 100 MG capsule Take 100 mg by mouth 2 (two) times daily.  Marland Kitchen donepezil (ARICEPT) 10 MG tablet Take 10 mg by mouth at bedtime. For dementia  . hydrALAZINE (APRESOLINE) 25 MG tablet Take 25 mg by mouth 3 (three) times daily.   . hydrochlorothiazide (HYDRODIURIL) 12.5 MG tablet Take 12.5 mg by mouth daily.  Marland Kitchen ipratropium-albuterol (DUONEB) 0.5-2.5 (3) MG/3ML SOLN Take 3 mLs by nebulization every 6 (six) hours as needed.  . loratadine (CLARITIN) 10 MG tablet Take 10 mg by mouth daily.  Marland Kitchen LORazepam (ATIVAN) 0.5 MG tablet Take 1 tablet (0.5 mg total) by mouth 2 (two) times daily.  Marland Kitchen lovastatin (MEVACOR) 40 MG tablet Take 40 mg by mouth at bedtime.  . nitroGLYCERIN (NITROSTAT) 0.4 MG SL tablet Place 0.4 mg under the tongue every 5 (five) minutes as needed for chest pain.   . pantoprazole (PROTONIX) 40 MG tablet Take 40 mg by mouth daily.   . potassium chloride SA (K-DUR,KLOR-CON) 20 MEQ tablet Take 20 mEq by mouth daily.  . Travoprost, BAK Free, (TRAVATAN) 0.004 % SOLN ophthalmic solution Place 1 drop into both eyes at bedtime. For glaucoma   No facility-administered encounter medications on file as of 08/16/2017.     Review of Systems   In general she is not complaining of any fever chills.  Skin does not complain of rashes or itching has venous stasis changes lower extremities bilaterally but head ears eyes nose mouth and throat does not complain of any sore throat or visual changes.  Respiratory is not complaining of  shortness of breath or cough still at times has oxygen on but is doing significantly better without it she did  several weeks ago.   Cardiac does not complain any chest pain has chronic lower extremity edema appears relatively stable with venous stasis changes.  GI is not complaining of abdominal pain nausea vomiting diarrhea or constipation.  Musculoskeletal is not really complaining of joint pain today has complained of somewhat diffuse complaints in the past.  Neurologic does not complain of dizziness headache or syncope.  Psych does have some mild cognitive deficits but is not complaining of any anxiety at this point does have some history of anxiety well controlled on Ativan.  Is    Immunization History  Administered Date(s) Administered  . Influenza Whole 06/11/2007, 10/16/2008, 05/21/2009, 05/05/2010  . Influenza-Unspecified 06/05/2014, 06/02/2016, 06/02/2017,  06/02/2017  . Pneumococcal Conjugate-13 06/06/2016  . Pneumococcal Polysaccharide-23 06/11/2007, 06/01/2016  . Pneumococcal-Unspecified 06/06/2016  . Td 06/11/2007  . Tdap 06/24/2017  . Zoster 05/21/2007   Pertinent  Health Maintenance Due  Topic Date Due  . PNA vac Low Risk Adult (2 of 2 - PCV13) 06/06/2025 (Originally 06/06/2017)  . INFLUENZA VACCINE  Completed  . DEXA SCAN  Completed   Fall Risk  05/22/2017  Falls in the past year? No   Functional Status Survey:    Vitals:   08/16/17 1435  BP: (!) 130/58  Pulse: 91  Resp: 18  SpO2: 97%    Physical Exam   In general this is a pleasant obese elderly female in no distress sitting comfortably in her wheelchair.  Her skin is warm and dry has chronic venous stasis changes lower extremities with some scaling which is not new.  Eyes visual acuity appears grossly intact.  Oropharynx is clear mucous membranes moist.  Chest is clear to auscultation with somewhat shallow air entry there is no labored breathing.  Heart is regular rate and rhythm without  murmur gallop or rub she has chronic venous stasis edema appears relatively unchanged.  Abdomen is soft obese nontender with positive bowel sounds.  Musculoskeletal moves all extremities at baseline with some lower extremity weakness which is baseline.  Neurologic is grossly intact no lateralizing findings speech is clear.  Psych she is grossly alert and oriented has some mild cognitive deficits is pleasant and appropriate  Labs reviewed: Recent Labs    06/05/17 0700 07/07/17 0706 08/15/17 0730  NA 140 138 141  K 4.1 3.6 3.4*  CL 102 98* 100*  CO2 28 32 27  GLUCOSE 87 87 95  BUN 26* 18 26*  CREATININE 1.15* 0.98 1.32*  CALCIUM 9.1 9.3 9.4   Recent Labs    09/27/16 0715  AST 20  ALT 15  ALKPHOS 57  BILITOT 0.5  PROT 7.1  ALBUMIN 3.9   Recent Labs    02/17/17 0707  05/19/17 0800 06/05/17 0700 08/15/17 0730  WBC 4.2   < > 5.8 4.8 4.2  NEUTROABS 2.1  --  3.2 2.7  --   HGB 11.2*   < > 11.2* 10.8* 11.5*  HCT 34.5*   < > 34.0* 33.7* 37.2  MCV 83.5   < > 86.5 87.3 85.1  PLT 211   < > 209 234 245   < > = values in this interval not displayed.   Lab Results  Component Value Date   TSH 1.454 04/10/2015   Lab Results  Component Value Date   HGBA1C 5.5 03/20/2017   Lab Results  Component Value Date   CHOL 215 (H) 02/17/2017   HDL 103 02/17/2017   LDLCALC 99 02/17/2017   TRIG 66 02/17/2017   CHOLHDL 2.1 02/17/2017    Significant Diagnostic Results in last 30 days:  No results found.  Assessment/Plan  #1 hypertension-this appears to have improved with increasing her hydralazine to 3 times a day she is also on Norvasc 5 mg a day and hydrochlorothiazide 12.5 mg a day-at this point will monitor there is some variability but consistent elevations appear to haveimproved  2.  Hypokalemia we will supplement with potassium 20 mEq for 2 days  thereafter 10 mEq a day-she has been started on hydrochlorothiazide which may be contributing to this-will update a lab later  this week as well as next week to ensure stability.  3.  History of diastolic CHF- her weight  has stabilized it appears but would like to get an updated weight she has refused Lasix in the past but now is on hydrochlorothiazide--clinically she appears to be doing well-will order weights q. weekly notify provider if gain greater than 3 pounds  CPT [phone]

## 2017-08-18 ENCOUNTER — Encounter (HOSPITAL_COMMUNITY)
Admission: RE | Admit: 2017-08-18 | Discharge: 2017-08-18 | Disposition: A | Discharge status: Home / Self Care | Payer: Medicare Other | Source: Skilled Nursing Facility | Attending: Internal Medicine | Admitting: Internal Medicine

## 2017-08-18 DIAGNOSIS — F329 Major depressive disorder, single episode, unspecified: Secondary | ICD-10-CM | POA: Insufficient documentation

## 2017-08-18 DIAGNOSIS — K219 Gastro-esophageal reflux disease without esophagitis: Secondary | ICD-10-CM | POA: Diagnosis not present

## 2017-08-18 DIAGNOSIS — F411 Generalized anxiety disorder: Secondary | ICD-10-CM | POA: Diagnosis not present

## 2017-08-18 DIAGNOSIS — I509 Heart failure, unspecified: Secondary | ICD-10-CM | POA: Diagnosis not present

## 2017-08-18 LAB — BASIC METABOLIC PANEL
Anion gap: 12 (ref 5–15)
BUN: 28 mg/dL — AB (ref 6–20)
CALCIUM: 9.6 mg/dL (ref 8.9–10.3)
CHLORIDE: 100 mmol/L — AB (ref 101–111)
CO2: 28 mmol/L (ref 22–32)
CREATININE: 1.28 mg/dL — AB (ref 0.44–1.00)
GFR calc non Af Amer: 35 mL/min — ABNORMAL LOW (ref >60)
GFR, EST AFRICAN AMERICAN: 40 mL/min — AB (ref >60)
Glucose, Bld: 93 mg/dL (ref 65–99)
Potassium: 3.7 mmol/L (ref 3.5–5.1)
SODIUM: 140 mmol/L (ref 135–145)

## 2017-08-24 ENCOUNTER — Encounter (HOSPITAL_COMMUNITY)
Admission: RE | Admit: 2017-08-24 | Discharge: 2017-08-24 | Disposition: A | Discharge status: Home / Self Care | Payer: Medicare Other | Source: Skilled Nursing Facility | Attending: *Deleted | Admitting: *Deleted

## 2017-08-24 DIAGNOSIS — K219 Gastro-esophageal reflux disease without esophagitis: Secondary | ICD-10-CM | POA: Diagnosis not present

## 2017-08-24 DIAGNOSIS — F411 Generalized anxiety disorder: Secondary | ICD-10-CM | POA: Diagnosis not present

## 2017-08-24 DIAGNOSIS — I509 Heart failure, unspecified: Secondary | ICD-10-CM | POA: Diagnosis not present

## 2017-08-24 DIAGNOSIS — F329 Major depressive disorder, single episode, unspecified: Secondary | ICD-10-CM | POA: Diagnosis not present

## 2017-08-24 LAB — BASIC METABOLIC PANEL
ANION GAP: 10 (ref 5–15)
BUN: 33 mg/dL — ABNORMAL HIGH (ref 6–20)
CALCIUM: 9.3 mg/dL (ref 8.9–10.3)
CO2: 27 mmol/L (ref 22–32)
CREATININE: 1.2 mg/dL — AB (ref 0.44–1.00)
Chloride: 101 mmol/L (ref 101–111)
GFR calc non Af Amer: 38 mL/min — ABNORMAL LOW (ref >60)
GFR, EST AFRICAN AMERICAN: 44 mL/min — AB (ref >60)
Glucose, Bld: 90 mg/dL (ref 65–99)
Potassium: 3.6 mmol/L (ref 3.5–5.1)
SODIUM: 138 mmol/L (ref 135–145)

## 2017-08-29 DIAGNOSIS — F320 Major depressive disorder, single episode, mild: Secondary | ICD-10-CM | POA: Diagnosis not present

## 2017-08-29 DIAGNOSIS — F428 Other obsessive-compulsive disorder: Secondary | ICD-10-CM | POA: Diagnosis not present

## 2017-08-29 DIAGNOSIS — F4310 Post-traumatic stress disorder, unspecified: Secondary | ICD-10-CM | POA: Diagnosis not present

## 2017-08-29 DIAGNOSIS — F411 Generalized anxiety disorder: Secondary | ICD-10-CM | POA: Diagnosis not present

## 2017-09-07 ENCOUNTER — Encounter (HOSPITAL_COMMUNITY)
Admission: RE | Admit: 2017-09-07 | Discharge: 2017-09-07 | Disposition: A | Discharge status: Home / Self Care | Payer: Medicare Other | Source: Skilled Nursing Facility | Attending: Internal Medicine | Admitting: Internal Medicine

## 2017-09-07 DIAGNOSIS — R609 Edema, unspecified: Secondary | ICD-10-CM | POA: Diagnosis not present

## 2017-09-07 DIAGNOSIS — F419 Anxiety disorder, unspecified: Secondary | ICD-10-CM | POA: Insufficient documentation

## 2017-09-07 DIAGNOSIS — B351 Tinea unguium: Secondary | ICD-10-CM | POA: Diagnosis not present

## 2017-09-07 DIAGNOSIS — K210 Gastro-esophageal reflux disease with esophagitis: Secondary | ICD-10-CM | POA: Diagnosis not present

## 2017-09-07 DIAGNOSIS — I509 Heart failure, unspecified: Secondary | ICD-10-CM | POA: Diagnosis not present

## 2017-09-07 DIAGNOSIS — I739 Peripheral vascular disease, unspecified: Secondary | ICD-10-CM | POA: Diagnosis not present

## 2017-09-07 DIAGNOSIS — F339 Major depressive disorder, recurrent, unspecified: Secondary | ICD-10-CM | POA: Insufficient documentation

## 2017-09-07 LAB — BASIC METABOLIC PANEL
Anion gap: 12 (ref 5–15)
BUN: 36 mg/dL — ABNORMAL HIGH (ref 6–20)
CO2: 26 mmol/L (ref 22–32)
Calcium: 9.6 mg/dL (ref 8.9–10.3)
Chloride: 99 mmol/L — ABNORMAL LOW (ref 101–111)
Creatinine, Ser: 1.38 mg/dL — ABNORMAL HIGH (ref 0.44–1.00)
GFR calc Af Amer: 37 mL/min — ABNORMAL LOW (ref >60)
GFR calc non Af Amer: 32 mL/min — ABNORMAL LOW (ref >60)
Glucose, Bld: 90 mg/dL (ref 65–99)
POTASSIUM: 3.7 mmol/L (ref 3.5–5.1)
SODIUM: 137 mmol/L (ref 135–145)

## 2017-09-08 ENCOUNTER — Non-Acute Institutional Stay (SKILLED_NURSING_FACILITY): Payer: Medicare Other | Admitting: Internal Medicine

## 2017-09-08 ENCOUNTER — Encounter: Payer: Self-pay | Admitting: Internal Medicine

## 2017-09-08 DIAGNOSIS — N183 Chronic kidney disease, stage 3 unspecified: Secondary | ICD-10-CM

## 2017-09-08 DIAGNOSIS — I82402 Acute embolism and thrombosis of unspecified deep veins of left lower extremity: Secondary | ICD-10-CM

## 2017-09-08 DIAGNOSIS — I10 Essential (primary) hypertension: Secondary | ICD-10-CM

## 2017-09-08 DIAGNOSIS — F0391 Unspecified dementia with behavioral disturbance: Secondary | ICD-10-CM | POA: Diagnosis not present

## 2017-09-08 DIAGNOSIS — E785 Hyperlipidemia, unspecified: Secondary | ICD-10-CM

## 2017-09-08 DIAGNOSIS — I5032 Chronic diastolic (congestive) heart failure: Secondary | ICD-10-CM | POA: Diagnosis not present

## 2017-09-08 NOTE — Progress Notes (Signed)
Location:   [facility] Room Number: 122/D Place of Service:  SNF (31) Provider:  Mickel Duhamel, PA-C  Patient Care Team: [name] as PCP - General (Internal Medicine) [name], MD as Consulting Physician (Geriatric Medicine)  Extended Emergency Contact Information Primary Emergency Contact: [name], [place] [address] [name] of [place] Phone: [phone] Relation: Daughter Secondary Emergency Contact: [name] States of [place] Phone: [phone] Relation: Son  Code Status:  DNR Goals of care: Advanced Directive information Advanced Directives 09/08/2017  Does Patient Have a Medical Advance Directive? Yes  Type of Advance Directive Out of facility DNR (pink MOST or yellow form)  Does patient want to make changes to medical advance directive? No - Patient declined  Copy of [place] in Chart? No - copy requested  Pre-existing out of facility DNR order (yellow form or pink MOST form) -     Chief Complaint  Patient presents with  . Medical Management of Chronic Issues    Routine Visit  For medical management of chronic medical conditions including dementia-hypertension-diastolic CHF-chronic kidney disease-depression with anxiety- hyperlipidemia- osteoarthritis with diffuse pain.    HPI:  Pt is a 84 y.o. female seen today for medical management of chronic diseases as noted above.  She appears to be doing well and has no complaints today.  She has been seen recently for blood pressure issues-at times appears to have had some significantly elevated blood pressures this is complicated however with some lower blood pressures at times as well.  We have titrated some of her medicines currently she is on hydralazine 25 mg 3 times daily Norvasc 5 mg a day--and hydrochlorothiazide 12.5 mg daily.  I got a manual reading of 144/62 today I see computer readings  of 160/67-146/74- speaking with nursing she has had recent blood pressure is 119/55-124/51 appears her continues to be quite a bit of variability but no consistent elevations.  She also has a history of wildly variable weights most recent weights appear to be in the low 170s at one point she had been in the upper 190s-there is some suspicion there scale variation here patient at times will have multiple layers of clothes on other times not which probably leads to the variable readings as well.  She does have a history of diastolic CHF her edema appears to be at baseline she is on low-dose hydrochlorothiazide she has refused Lasix in the past.  She does not complain of any shortness of breath on exam today is quite benign.  BNP was slightly over 300 on lab done in late October which is actually somewhat on the higher end for her again she appears to be stable at this time.  He also had mild hypokalemia and this is being supplemented.  This is complicated somewhat with a history of chronic kidney disease creatinine of 1.38 BUN of 36 on lab done earlier this week appears to be relatively baseline for her potassium was 3.7.  She also has had recent hypoxia which has improved at one point we did order a CT scan secondarily to a mildly elevated d-dimer-this was negative for any pulmonary embolism she is now largely off oxygen-she was treated for pneumonia a short course for an x-ray that showed possible element of that.  Regards to dementia with depression and anxiety this is been stable she is on Aricept as well as  Ativan twice daily routinely as well as Celexa she is in good spirits today again with no complaints.  She also ha a history of a distal left femoral DVTs at one point completed a course of Eliquis there was consideration of transitioning to aspirin but she has refused the aspirin.  Think she is allergic to it although the nature of this allergy is quite unclear.  She also has a history  of hyperlipidemia she is on a statin LDL has been satisfactory at 99 on lab done in June HDL actually was slightly over 100.  She also complains at times of diffuse joint pain osteoarthritic pain she is on Tylenol as needed once a day as well as routinely twice a day this point appears to be effective.  She also at times will complain of stomach discomfort GI issues but this is been stable she is on narcotics per GI she does have some history of esophageal dysmotility.  Currently she is resting in her wheelchair comfortably at times will ambulate down the hall continues to be pleasant conversant in good humor-does not appear overtly anxious this afternoon.     Past Medical History:  Diagnosis Date  . Anxiety   . Cellulitis 2016  . CHF (congestive heart failure) ([place])   . Chronic kidney disease   . Dementia   . Dysphagia   . Gait abnormality   . GERD (gastroesophageal reflux disease)   . Glaucoma   . Gout   . Hyperlipidemia   . Hypertension   . Muscle weakness   . Myocardial infarction [facility]) [place]  . Osteoarthritis   . Syncope    Past Surgical History:  Procedure Laterality Date  . APPENDECTOMY    . CATARACT EXTRACTION W/PHACO  08/04/2011   Procedure: CATARACT EXTRACTION PHACO AND INTRAOCULAR LENS PLACEMENT (IOC);  Surgeon: Tonny Branch;  Location: [facility];  Service: Ophthalmology;  Laterality: Right;  CDE=18.53  . CATARACT EXTRACTION W/PHACO  08/25/2011   Procedure: CATARACT EXTRACTION PHACO AND INTRAOCULAR LENS PLACEMENT (IOC);  Surgeon: Tonny Branch;  Location: [facility];  Service: Ophthalmology;  Laterality: Left;  CDE:14.76  . TONSILLECTOMY      Allergies  Allergen Reactions  . Bee Venom Anaphylaxis  . Angiotensin Receptor Blockers     Tongue swelling.  . Aspirin     Directed by physician  . Cabbage Other (See Comments)    Certain foods due to gout flares  . Latex Rash    Outpatient Encounter Medications as of 09/08/2017  Medication Sig  . acetaminophen  (TYLENOL) 500 MG tablet Take 1,000 mg by mouth 2 (two) times daily. For leg pain and arthritis. May have additional 1000 mg for break through pain  . amLODipine (NORVASC) 5 MG tablet Take 5 mg by mouth daily.  . Brinzolamide-Brimonidine (SIMBRINZA) 1-0.2 % SUSP Apply to eye. One  drop into both eyes three times a day  . Calcium Carbonate-Vitamin D (CALCIUM-VITAMIN D) 500-200 MG-UNIT tablet Take 1 tablet by mouth 2 (two) times daily.  . citalopram (CELEXA) 20 MG tablet Take 20 mg by mouth daily.  . cycloSPORINE (RESTASIS) 0.05 % ophthalmic emulsion Place 1 drop into both eyes 2 (two) times daily.  Marland Kitchen docusate sodium (COLACE) 100 MG capsule Take 100 mg by mouth 2 (two) times daily.  Marland Kitchen donepezil (ARICEPT) 10 MG tablet Take 10 mg by mouth at bedtime. For dementia  . hydrALAZINE (APRESOLINE) 25 MG tablet Take 25 mg by mouth 3 (three) times daily.   Marland Kitchen  hydrochlorothiazide (HYDRODIURIL) 12.5 MG tablet Take 12.5 mg by mouth daily.  Marland Kitchen ipratropium-albuterol (DUONEB) 0.5-2.5 (3) MG/3ML SOLN Take 3 mLs by nebulization every 6 (six) hours as needed.  . loratadine (CLARITIN) 10 MG tablet Take 10 mg by mouth daily.  Marland Kitchen LORazepam (ATIVAN) 0.5 MG tablet Take 1 tablet (0.5 mg total) by mouth 2 (two) times daily.  Marland Kitchen lovastatin (MEVACOR) 40 MG tablet Take 40 mg by mouth at bedtime.  . nitroGLYCERIN (NITROSTAT) 0.4 MG SL tablet Place 0.4 mg under the tongue every 5 (five) minutes as needed for chest pain.   . pantoprazole (PROTONIX) 40 MG tablet Take 40 mg by mouth daily.   . potassium chloride (K-DUR,KLOR-CON) 10 MEQ tablet Take 10 mEq by mouth daily.  . Travoprost, BAK Free, (TRAVATAN) 0.004 % SOLN ophthalmic solution Place 1 drop into both eyes at bedtime. For glaucoma  . [DISCONTINUED] potassium chloride SA (K-DUR,KLOR-CON) 20 MEQ tablet Take 10 mEq by mouth daily.    No facility-administered encounter medications on file as of 09/08/2017.      Review of Systems  In general no complaints of fever chills her  weight has shown some fluctuation now is on the lower end of her baseline  If scales are accurate again this is complicated with patient having variable layers of clothing and possible scale variation  Skin does not complain of any rashes or itching does have venous stasis changes lower extremities.  Head ears eyes nose mouth and throat is not complaining of any sore throat or visual changes.  Respiratory denies shortness of breath or cough today.  Cardiac does not complain of chest pain appears to have somewhat chronic lower extremity edema venous stasis changes.  GI does not complain of abdominal discomfort nausea vomiting diarrhea constipation this afternoon.  GU does not complain of dysuria.  Musculoskeletal is not really complaining of joint pain at times will complain of somewhat diffuse complaints mostly  involving her legs.  Neurologic is not complaining of dizziness headache syncope or numbness at this time.  Psych does have a history of significant anxiety but this appears to be well controlled today.  Of note manual reading was 144/62  Immunization History  Administered Date(s) Administered  . Influenza Whole 06/11/2007, 10/16/2008, 05/21/2009, 05/05/2010  . Influenza-Unspecified 06/05/2014, 06/02/2016, 06/02/2017, 06/02/2017  . Pneumococcal Conjugate-13 06/06/2016  . Pneumococcal Polysaccharide-23 06/11/2007, 06/01/2016  . Pneumococcal-Unspecified 06/06/2016  . Td 06/11/2007  . Tdap 06/24/2017  . Zoster 05/21/2007   Pertinent  Health Maintenance Due  Topic Date Due  . PNA vac Low Risk Adult (2 of 2 - PCV13) 06/06/2025 (Originally 06/06/2017)  . INFLUENZA VACCINE  Completed  . DEXA SCAN  Completed   Fall Risk  05/22/2017  Falls in the past year? No   Functional Status Survey:    Vitals:   09/08/17 1321  BP: (!) 160/67  Pulse: 67  Resp: 18  Temp: 97.6 F (36.4 C)  SpO2: 93%  Weight: 171 lb 3.2 oz (77.7 kg)  Height: 4\' 11"  (1.499 m)   Of note manual  reading was 144/62  Body mass index is 34.58 kg/m. Physical Exam In general this is a pleasant obese elderly female no distress.  Her skin is warm and dry has chronic venous stasis changes of her lower extremities bilaterally which appear unchanged I do not really see any increased erythema or warmth from baseline.  Eyes visual acuity appears grossly intact sclera are clear.  Oropharynx is clear mucous membranes moist.  Chest is  clear to auscultation with shallow air entry there is no labored breathing.  Heart is regular rate and rhythm without murmur gallop or rub the venous stasis edema lower extremities appears to be baseline.  Abdomen is obese soft nontender with positive bowel sounds.  Musculoskeletal is moving all extremities x4 at baseline today he is ambulating in wheelchair without difficulty-does have baseline lower extremity weakness venous stasis changes which are unchanged.  Neurologic is grossly intact her speech is clear she is alert could not really appreciate lateralizing findings.  Psych she is largely alert and oriented does have some cognitive deficits but is quite conversant and in good humor Labs reviewed: Recent Labs    08/18/17 0715 08/24/17 0900 09/07/17 0700  NA 140 138 137  K 3.7 3.6 3.7  CL 100* 101 99*  CO2 28 27 26   GLUCOSE 93 90 90  BUN 28* 33* 36*  CREATININE 1.28* 1.20* 1.38*  CALCIUM 9.6 9.3 9.6   Recent Labs    09/27/16 0715  AST 20  ALT 15  ALKPHOS 57  BILITOT 0.5  PROT 7.1  ALBUMIN 3.9   Recent Labs    02/17/17 0707  05/19/17 0800 06/05/17 0700 08/15/17 0730  WBC 4.2   < > 5.8 4.8 4.2  NEUTROABS 2.1  --  3.2 2.7  --   HGB 11.2*   < > 11.2* 10.8* 11.5*  HCT 34.5*   < > 34.0* 33.7* 37.2  MCV 83.5   < > 86.5 87.3 85.1  PLT 211   < > 209 234 245   < > = values in this interval not displayed.   Lab Results  Component Value Date   TSH 1.454 04/10/2015   Lab Results  Component Value Date   HGBA1C 5.5 03/20/2017   Lab  Results  Component Value Date   CHOL 215 (H) 02/17/2017   HDL 103 02/17/2017   LDLCALC 99 02/17/2017   TRIG 66 02/17/2017   CHOLHDL 2.1 02/17/2017    Significant Diagnostic Results in last 30 days:  No results found.  Assessment/Plan  #1-history of hypertension this has been somewhat challenging she is on numerous agents including hydralazine 25 mg 3 times daily- hydrochlorothiazide 12.5 mg daily Norvasc 5 mg a day per discussion above will continue current meds I do not see significant consistent elevated readings or low readings.  2.-History of diastolic CHF this appears stable as well edema appears to be at baseline but the exam is quite benign BNP was mildly elevated from her baseline back in October but there is not really been any weight increase certainly or change in clinical status she has been started on hydrochlorothiazide which I suspect helps again she has refused Lasix in the past.  3.  Hypokalemia I suspect this is secondary to the hydrochlorothiazide she is been started on low-dose potassium and this appears to be stabilized with potassium of 3.7 on lab done earlier this week we will recheck a BMP in a couple weeks to ensure stability.  4.  History of hypoxia-she no longer requires oxygen the great majority of time---she did receive treatment for pneumonia several weeks ago-she did have an elevated d-dimer however CT scan was negative for any DVT again this appears to have improved she is not complaining of shortness of breath today.  5.-History of chronic kidney disease again this appears relatively stable with creatinine 1.38 will update this in a couple weeks however since it is on the higher end of her recent baseline.  6.  History of dementia with anxiety and depression but this appears to be stable she is on Aricept as well as Celexa and Ativan twice daily.  7.  History of GERD she is on Protonix she does have some history of esophageal dysmotility.  Further evaluation  by GI again this is been stable now for some time is not complaining of abdominal discomfort at this time.  8.  History of distal left femoral DVT she has completed a course of Eliquis but has refused aspirin since she says she is allergic to-nonetheless this appears to be relatively stable  #9-history of hyperlipidemia this appears relatively stable with an LDL of 99 back in June and HDL was over 100 will update this when update BMP in a couple weeks  #10-history of osteoarthritic leg pain this appears stabilized routine Tylenol twice daily as needed dose when needed.  Again will update a BMP with her history of renal insufficiency and hypokalemia as well as a fasting lipid panel for updated values in a couple weeks.  [id number] of note greater than 40 minutes spent assessing patient -- reviewed her chart-reviewed her labs-discussing her status with nursing staff-and coordinating and formulating a plan of care for numerous diagnoses- of note greater than 50% of time spent coordinating plan of care

## 2017-09-14 ENCOUNTER — Other Ambulatory Visit: Payer: Self-pay

## 2017-09-14 MED ORDER — LORAZEPAM 0.5 MG PO TABS: 0.5000 mg | ORAL_TABLET | Freq: Two times a day (BID) | ORAL | 0 refills | Status: DC

## 2017-09-14 NOTE — Telephone Encounter (Signed)
RX Fax for [facility]@ [phone]  

## 2017-09-15 DIAGNOSIS — F0390 Unspecified dementia without behavioral disturbance: Secondary | ICD-10-CM | POA: Diagnosis not present

## 2017-09-15 DIAGNOSIS — F411 Generalized anxiety disorder: Secondary | ICD-10-CM | POA: Diagnosis not present

## 2017-09-18 ENCOUNTER — Encounter (HOSPITAL_COMMUNITY)
Admission: RE | Admit: 2017-09-18 | Discharge: 2017-09-18 | Disposition: A | Discharge status: Home / Self Care | Payer: Medicare Other | Source: Skilled Nursing Facility | Attending: *Deleted | Admitting: *Deleted

## 2017-09-19 DIAGNOSIS — F320 Major depressive disorder, single episode, mild: Secondary | ICD-10-CM | POA: Diagnosis not present

## 2017-09-19 DIAGNOSIS — F411 Generalized anxiety disorder: Secondary | ICD-10-CM | POA: Diagnosis not present

## 2017-09-19 DIAGNOSIS — F428 Other obsessive-compulsive disorder: Secondary | ICD-10-CM | POA: Diagnosis not present

## 2017-09-21 ENCOUNTER — Non-Acute Institutional Stay (SKILLED_NURSING_FACILITY): Payer: Medicare Other | Admitting: Internal Medicine

## 2017-09-21 ENCOUNTER — Encounter: Payer: Self-pay | Admitting: Internal Medicine

## 2017-09-21 DIAGNOSIS — I10 Essential (primary) hypertension: Secondary | ICD-10-CM

## 2017-09-21 DIAGNOSIS — N183 Chronic kidney disease, stage 3 unspecified: Secondary | ICD-10-CM

## 2017-09-21 DIAGNOSIS — D649 Anemia, unspecified: Secondary | ICD-10-CM

## 2017-09-21 DIAGNOSIS — I5032 Chronic diastolic (congestive) heart failure: Secondary | ICD-10-CM

## 2017-09-21 NOTE — Progress Notes (Signed)
Location:   [facility] Room Number: 122/D Place of Service:  SNF (31) Provider:  Tana Conch, PA-C  Patient Care Team: [name] as PCP - General (Internal Medicine) [name], MD as Consulting Physician (Geriatric Medicine)  Extended Emergency Contact Information Primary Emergency Contact: [name], [place] [address] [name] of [place] Phone: [phone] Relation: Daughter Secondary Emergency Contact: [name] States of [place] Phone: [phone] Relation: Son  Code Status:  DNR Goals of care: Advanced Directive information Advanced Directives 09/21/2017  Does Patient Have a Medical Advance Directive? Yes  Type of Advance Directive Out of facility DNR (pink MOST or yellow form)  Does patient want to make changes to medical advance directive? No - Patient declined  Copy of [place] in Chart? No - copy requested  Pre-existing out of facility DNR order (yellow form or pink MOST form) -     Chief Complaint  Patient presents with  . Acute Visit    Patients c/o Elevated B/P    HPI:  Pt is a 84 y.o. female seen today for an acute visit for Hypertension. Patient has H/o Hypertension, Distal DVT, Chronic venous stasis,  Diastolic CHF chronic Renal disaese and GERD,Glaucoma,Dementia and Anxiety Patient has h/o Labile Blood pressure with variation of systolic 790 to 240. She was stable for past few months on low dose of HCTZ. But for past few days her BP is again elevated at 973-532 systolic. Patient denies any problems of Chest pain, SOB . She actually is doing well in facility. She has lost almost 15 lbs in past few months. She says she is trying to eat less and she feels better now that she has lost this weight. Swelling in her legs is much better. She is off her chronic oxygen.   Past Medical History:  Diagnosis Date  . Anxiety   . Cellulitis  2016  . CHF (congestive heart failure) ([place])   . Chronic kidney disease   . Dementia   . Dysphagia   . Gait abnormality   . GERD (gastroesophageal reflux disease)   . Glaucoma   . Gout   . Hyperlipidemia   . Hypertension   . Muscle weakness   . Myocardial infarction [facility]) [place]  . Osteoarthritis   . Syncope    Past Surgical History:  Procedure Laterality Date  . APPENDECTOMY    . CATARACT EXTRACTION W/PHACO  08/04/2011   Procedure: CATARACT EXTRACTION PHACO AND INTRAOCULAR LENS PLACEMENT (IOC);  Surgeon: Tonny Branch;  Location: [facility];  Service: Ophthalmology;  Laterality: Right;  CDE=18.53  . CATARACT EXTRACTION W/PHACO  08/25/2011   Procedure: CATARACT EXTRACTION PHACO AND INTRAOCULAR LENS PLACEMENT (IOC);  Surgeon: Tonny Branch;  Location: [facility];  Service: Ophthalmology;  Laterality: Left;  CDE:14.76  . TONSILLECTOMY      Allergies  Allergen Reactions  . Bee Venom Anaphylaxis  . Angiotensin Receptor Blockers     Tongue swelling.  . Aspirin     Directed by physician  . Cabbage Other (See Comments)    Certain foods due to gout flares  . Latex Rash    Outpatient Encounter Medications as of 09/21/2017  Medication Sig  . acetaminophen (TYLENOL) 500 MG tablet Take 1,000 mg by mouth 2 (two) times daily. For leg pain and arthritis. May have additional 1000 mg for break through pain  .  amLODipine (NORVASC) 5 MG tablet Take 5 mg by mouth daily.  . Brinzolamide-Brimonidine (SIMBRINZA) 1-0.2 % SUSP Apply to eye. One  drop into both eyes three times a day  . Calcium Carbonate-Vitamin D (CALCIUM-VITAMIN D) 500-200 MG-UNIT tablet Take 1 tablet by mouth 2 (two) times daily.  . citalopram (CELEXA) 20 MG tablet Take 20 mg by mouth daily.  . cycloSPORINE (RESTASIS) 0.05 % ophthalmic emulsion Place 1 drop into both eyes 2 (two) times daily.  Marland Kitchen docusate sodium (COLACE) 100 MG capsule Take 100 mg by mouth 2 (two) times daily.  Marland Kitchen donepezil (ARICEPT) 10 MG tablet Take 10 mg by mouth  at bedtime. For dementia  . hydrALAZINE (APRESOLINE) 25 MG tablet Take 25 mg by mouth 3 (three) times daily.   . hydrochlorothiazide (HYDRODIURIL) 12.5 MG tablet Take 12.5 mg by mouth daily.  Marland Kitchen ipratropium-albuterol (DUONEB) 0.5-2.5 (3) MG/3ML SOLN Take 3 mLs by nebulization every 6 (six) hours as needed.  . loratadine (CLARITIN) 10 MG tablet Take 10 mg by mouth daily.  Marland Kitchen LORazepam (ATIVAN) 0.5 MG tablet Take 1 tablet (0.5 mg total) by mouth 2 (two) times daily.  Marland Kitchen lovastatin (MEVACOR) 40 MG tablet Take 40 mg by mouth at bedtime.  . nitroGLYCERIN (NITROSTAT) 0.4 MG SL tablet Place 0.4 mg under the tongue every 5 (five) minutes as needed for chest pain.   . pantoprazole (PROTONIX) 40 MG tablet Take 40 mg by mouth daily.   . potassium chloride (K-DUR,KLOR-CON) 10 MEQ tablet Take 10 mEq by mouth daily.  . Travoprost, BAK Free, (TRAVATAN) 0.004 % SOLN ophthalmic solution Place 1 drop into both eyes at bedtime. For glaucoma   No facility-administered encounter medications on file as of 09/21/2017.      Review of Systems  Review of Systems  Constitutional: Negative for activity change, appetite change, chills, diaphoresis, fatigue and fever.  HENT: Negative for mouth sores, postnasal drip, rhinorrhea, sinus pain and sore throat.   Respiratory: Negative for apnea, cough, chest tightness, shortness of breath and wheezing.   Cardiovascular: Negative for chest pain, palpitations and leg swelling.  Gastrointestinal: Negative for abdominal distention, abdominal pain, constipation, diarrhea, nausea and vomiting.  Genitourinary: Negative for dysuria and frequency.  Musculoskeletal: Negative for arthralgias, joint swelling and myalgias.  Skin: Negative for rash.  Neurological: Negative for dizziness, syncope, weakness, light-headedness and numbness.  Psychiatric/Behavioral: Negative for behavioral problems, confusion and sleep disturbance.     Immunization History  Administered Date(s) Administered    . Influenza Whole 06/11/2007, 10/16/2008, 05/21/2009, 05/05/2010  . Influenza-Unspecified 06/05/2014, 06/02/2016, 06/02/2017, 06/02/2017  . Pneumococcal Conjugate-13 06/06/2016  . Pneumococcal Polysaccharide-23 06/11/2007, 06/01/2016  . Pneumococcal-Unspecified 06/06/2016  . Td 06/11/2007  . Tdap 06/24/2017  . Zoster 05/21/2007   Pertinent  Health Maintenance Due  Topic Date Due  . PNA vac Low Risk Adult (2 of 2 - PCV13) 06/06/2025 (Originally 06/06/2017)  . INFLUENZA VACCINE  Completed  . DEXA SCAN  Completed   Fall Risk  05/22/2017  Falls in the past year? No   Functional Status Survey:    Vitals:   09/21/17 1104  BP: (!) 190/84 repeated manually 155/90  Pulse: 80  Resp: 18  Temp: 98.6 F (37 C)  TempSrc: Oral  SpO2: 95%   There is no height or weight on file to calculate BMI. Physical Exam  Constitutional: She appears well-developed and well-nourished.  HENT:  Head: Normocephalic.  Mouth/Throat: Oropharynx is clear and moist.  Eyes: Pupils are equal, round, and reactive to light.  Neck: Neck supple.  Cardiovascular: Normal rate and normal heart sounds.  Pulmonary/Chest: Effort normal and breath sounds normal. No respiratory distress. She has no wheezes. She has no rales.  Abdominal: Soft. Bowel sounds are normal. She exhibits no distension. There is no tenderness. There is no rebound.  Musculoskeletal:  Significant improvement in her LE edema  Lymphadenopathy:    She has no cervical adenopathy.  Neurological: She is alert.  Skin: Skin is warm.  Psychiatric: She has a normal mood and affect. Her behavior is normal. Thought content normal.    Labs reviewed: Recent Labs    08/18/17 0715 08/24/17 0900 09/07/17 0700  NA 140 138 137  K 3.7 3.6 3.7  CL 100* 101 99*  CO2 28 27 26   GLUCOSE 93 90 90  BUN 28* 33* 36*  CREATININE 1.28* 1.20* 1.38*  CALCIUM 9.6 9.3 9.6   Recent Labs    09/27/16 0715  AST 20  ALT 15  ALKPHOS 57  BILITOT 0.5  PROT 7.1   ALBUMIN 3.9   Recent Labs    02/17/17 0707  05/19/17 0800 06/05/17 0700 08/15/17 0730  WBC 4.2   < > 5.8 4.8 4.2  NEUTROABS 2.1  --  3.2 2.7  --   HGB 11.2*   < > 11.2* 10.8* 11.5*  HCT 34.5*   < > 34.0* 33.7* 37.2  MCV 83.5   < > 86.5 87.3 85.1  PLT 211   < > 209 234 245   < > = values in this interval not displayed.   Lab Results  Component Value Date   TSH 1.454 04/10/2015   Lab Results  Component Value Date   HGBA1C 5.5 03/20/2017   Lab Results  Component Value Date   CHOL 215 (H) 02/17/2017   HDL 103 02/17/2017   LDLCALC 99 02/17/2017   TRIG 66 02/17/2017   CHOLHDL 2.1 02/17/2017    Significant Diagnostic Results in last 30 days:  No results found.  Assessment/Plan  Essential Hypertension Patient was doing well on Low dose of HCTZ.  But now she has spikes in BP in the mornings. Will increase the dose of her Hydralazine to 37.5 mg at night.  Continue 25 mg in the morning and in afternoon. Her BP bottoms down on higher doses of Hydralazine. Continue to monitor Vitals. Chronic diastolic congestive heart failure Her weight is stable. Her swelling in legs is improved. She is off her oxygen She continues to refuse Lasix. Has agreed for HCTZ. Weight loss Patient has lost significant amount of weight.D/W the Dietician I think it is intentinal as she is not eating  to loose weight. She feels good and want to keep it off. Will continue to monitor. Stage 3 chronic kidney disease Creatinine is staying stable.  Will repeat BMP   Hyperlipidemia, LDL 99 6/18 on lovastatin  Dementia stable on Aricept  Anemia Her Hgb is stable. Family/ staff Communication:   Labs/tests ordered:  Repeat Bmp in 1 week.

## 2017-09-22 ENCOUNTER — Encounter (HOSPITAL_COMMUNITY)
Admission: RE | Admit: 2017-09-22 | Discharge: 2017-09-22 | Disposition: A | Discharge status: Home / Self Care | Payer: Medicare Other | Source: Skilled Nursing Facility | Attending: Internal Medicine | Admitting: Internal Medicine

## 2017-09-22 DIAGNOSIS — R7989 Other specified abnormal findings of blood chemistry: Secondary | ICD-10-CM | POA: Insufficient documentation

## 2017-09-22 LAB — LIPID PANEL
CHOL/HDL RATIO: 1.9 ratio
Cholesterol: 234 mg/dL — ABNORMAL HIGH (ref 0–200)
HDL: 122 mg/dL (ref >40)
LDL CALC: 99 mg/dL (ref 0–99)
Triglycerides: 65 mg/dL (ref <150)
VLDL: 13 mg/dL (ref 0–40)

## 2017-09-22 LAB — BASIC METABOLIC PANEL
ANION GAP: 12 (ref 5–15)
BUN: 39 mg/dL — ABNORMAL HIGH (ref 6–20)
CO2: 28 mmol/L (ref 22–32)
Calcium: 9.7 mg/dL (ref 8.9–10.3)
Chloride: 100 mmol/L — ABNORMAL LOW (ref 101–111)
Creatinine, Ser: 1.39 mg/dL — ABNORMAL HIGH (ref 0.44–1.00)
GFR calc Af Amer: 37 mL/min — ABNORMAL LOW (ref >60)
GFR calc non Af Amer: 32 mL/min — ABNORMAL LOW (ref >60)
GLUCOSE: 96 mg/dL (ref 65–99)
POTASSIUM: 3.7 mmol/L (ref 3.5–5.1)
Sodium: 140 mmol/L (ref 135–145)

## 2017-09-25 ENCOUNTER — Encounter (HOSPITAL_COMMUNITY)
Admission: RE | Admit: 2017-09-25 | Discharge: 2017-09-25 | Disposition: A | Discharge status: Home / Self Care | Payer: Medicare Other | Source: Skilled Nursing Facility | Attending: *Deleted | Admitting: *Deleted

## 2017-09-25 DIAGNOSIS — I509 Heart failure, unspecified: Secondary | ICD-10-CM | POA: Diagnosis not present

## 2017-09-25 DIAGNOSIS — F339 Major depressive disorder, recurrent, unspecified: Secondary | ICD-10-CM | POA: Diagnosis not present

## 2017-09-25 DIAGNOSIS — R609 Edema, unspecified: Secondary | ICD-10-CM | POA: Diagnosis not present

## 2017-09-25 DIAGNOSIS — K210 Gastro-esophageal reflux disease with esophagitis: Secondary | ICD-10-CM | POA: Diagnosis not present

## 2017-09-25 DIAGNOSIS — F419 Anxiety disorder, unspecified: Secondary | ICD-10-CM | POA: Diagnosis not present

## 2017-09-25 LAB — BASIC METABOLIC PANEL
ANION GAP: 10 (ref 5–15)
BUN: 37 mg/dL — ABNORMAL HIGH (ref 6–20)
CALCIUM: 9.8 mg/dL (ref 8.9–10.3)
CHLORIDE: 102 mmol/L (ref 101–111)
CO2: 29 mmol/L (ref 22–32)
Creatinine, Ser: 1.18 mg/dL — ABNORMAL HIGH (ref 0.44–1.00)
GFR calc Af Amer: 45 mL/min — ABNORMAL LOW (ref >60)
GFR calc non Af Amer: 39 mL/min — ABNORMAL LOW (ref >60)
GLUCOSE: 77 mg/dL (ref 65–99)
Potassium: 3.5 mmol/L (ref 3.5–5.1)
Sodium: 141 mmol/L (ref 135–145)

## 2017-09-26 DIAGNOSIS — F320 Major depressive disorder, single episode, mild: Secondary | ICD-10-CM | POA: Diagnosis not present

## 2017-09-26 DIAGNOSIS — F411 Generalized anxiety disorder: Secondary | ICD-10-CM | POA: Diagnosis not present

## 2017-09-26 DIAGNOSIS — F428 Other obsessive-compulsive disorder: Secondary | ICD-10-CM | POA: Diagnosis not present

## 2017-09-26 DIAGNOSIS — F4310 Post-traumatic stress disorder, unspecified: Secondary | ICD-10-CM | POA: Diagnosis not present

## 2017-10-03 DIAGNOSIS — F428 Other obsessive-compulsive disorder: Secondary | ICD-10-CM | POA: Diagnosis not present

## 2017-10-03 DIAGNOSIS — F320 Major depressive disorder, single episode, mild: Secondary | ICD-10-CM | POA: Diagnosis not present

## 2017-10-03 DIAGNOSIS — F411 Generalized anxiety disorder: Secondary | ICD-10-CM | POA: Diagnosis not present

## 2017-10-05 ENCOUNTER — Non-Acute Institutional Stay (SKILLED_NURSING_FACILITY): Payer: Medicare Other | Admitting: Internal Medicine

## 2017-10-05 ENCOUNTER — Encounter: Payer: Self-pay | Admitting: Internal Medicine

## 2017-10-05 DIAGNOSIS — N183 Chronic kidney disease, stage 3 unspecified: Secondary | ICD-10-CM

## 2017-10-05 DIAGNOSIS — I10 Essential (primary) hypertension: Secondary | ICD-10-CM | POA: Diagnosis not present

## 2017-10-05 DIAGNOSIS — R634 Abnormal weight loss: Secondary | ICD-10-CM | POA: Diagnosis not present

## 2017-10-05 NOTE — Progress Notes (Signed)
Location:   [place] Room Number: 122/D Place of Service:  SNF [phone]) Provider:  Mickel Duhamel, PA-C  Patient Care Team: [name] as PCP - General (Internal Medicine) [name], MD as Consulting Physician (Geriatric Medicine)  Extended Emergency Contact Information Primary Emergency Contact: [name], [place] [address] [name] of [place] Phone: [phone] Relation: Daughter Secondary Emergency Contact: [name] States of [place] Phone: [phone] Relation: Son  Code Status:  DNR Goals of care: Advanced Directive information Advanced Directives 10/05/2017  Does Patient Have a Medical Advance Directive? Yes  Type of Advance Directive Out of facility DNR (pink MOST or yellow form)  Does patient want to make changes to medical advance directive? No - Patient declined  Copy of [place] in Chart? No - copy requested  Pre-existing out of facility DNR order (yellow form or pink MOST form) -    Chief complaint acute visit follow-up weight loss-question vomiting   HPI:  Pt is a 84 y.o. female seen today for an acute visit for weight loss.  Also complains of vomiting.  Patient is a long-term resident of facility with numerous diagnoses including GERD hypertension chronic venous stasis edema diastolic CHF renal disease glaucoma and dementia with anxiety.  It was noted that she has had some fairly consistent weight loss and per patient this was desired she was trying to lose weight- appears back in October her weight was 190 pounds and most recent weight is 166-this is been somewhat of a gradual decline in her venous stasis edema does actually look improved.  She states she has a fairly decent appetite but gives a report of occasional vomiting- although this may be more somewhat of a acid reflux reaction with her history of GERD.  Some of this could be more of a  gagging or coughing episode versus true vomiting.  She does not complaining of any abdominal pain she is on Protonix once a day.  She has seen GI in the past and apparently does have some history of esophageal dysmotility.  Currently she is sitting in her chair comfortably vital signs are stable she appears to be at her baseline pleasant smiling laughing somewhat anxious at times.  Per review of meds she is on hydralazine 25 mg twice daily and 37.5 mg at night-- as well as hydrochlorothiazide 12.5 mg a day with a history of hypertension-She is also on Norvasc 5 mg a day-hydralazine dose was recently increased secondary to blood pressure concerns I got a manual reading of 136/62 today previous readings are somewhat all over the board but I do not really see consistent elevations or depressions      Past Medical History:  Diagnosis Date  . Anxiety   . Cellulitis 2016  . CHF (congestive heart failure) ([place])   . Chronic kidney disease   . Dementia   . Dysphagia   . Gait abnormality   . GERD (gastroesophageal reflux disease)   . Glaucoma   . Gout   . Hyperlipidemia   . Hypertension   . Muscle weakness   . Myocardial infarction [facility]) [place]  . Osteoarthritis   . Syncope    Past Surgical History:  Procedure Laterality Date  . APPENDECTOMY    . CATARACT EXTRACTION W/PHACO  08/04/2011   Procedure: CATARACT EXTRACTION PHACO AND INTRAOCULAR LENS PLACEMENT (IOC);  Surgeon: Levada Dy  Hunt;  Location: [facility];  Service: Ophthalmology;  Laterality: Right;  CDE=18.53  . CATARACT EXTRACTION W/PHACO  08/25/2011   Procedure: CATARACT EXTRACTION PHACO AND INTRAOCULAR LENS PLACEMENT (IOC);  Surgeon: Tonny Branch;  Location: [facility];  Service: Ophthalmology;  Laterality: Left;  CDE:14.76  . TONSILLECTOMY      Allergies  Allergen Reactions  . Bee Venom Anaphylaxis  . Angiotensin Receptor Blockers     Tongue swelling.  . Aspirin     Directed by physician  . Cabbage Other (See Comments)     Certain foods due to gout flares  . Latex Rash    Outpatient Encounter Medications as of 10/05/2017  Medication Sig  . acetaminophen (TYLENOL) 500 MG tablet Take 1,000 mg by mouth 2 (two) times daily. For leg pain and arthritis. May have additional 1000 mg for break through pain  . amLODipine (NORVASC) 5 MG tablet Take 5 mg by mouth daily.  . Brinzolamide-Brimonidine (SIMBRINZA) 1-0.2 % SUSP Apply to eye. One  drop into both eyes three times a day  . Calcium Carbonate-Vitamin D (CALCIUM-VITAMIN D) 500-200 MG-UNIT tablet Take 1 tablet by mouth 2 (two) times daily.  . citalopram (CELEXA) 20 MG tablet Take 20 mg by mouth daily.  . cycloSPORINE (RESTASIS) 0.05 % ophthalmic emulsion Place 1 drop into both eyes 2 (two) times daily.  Marland Kitchen docusate sodium (COLACE) 100 MG capsule Take 100 mg by mouth 2 (two) times daily.  Marland Kitchen donepezil (ARICEPT) 10 MG tablet Take 10 mg by mouth at bedtime. For dementia  . hydrALAZINE (APRESOLINE) 25 MG tablet Take 25 mg by mouth 2 (two) times daily.   . hydrALAZINE (APRESOLINE) 25 MG tablet Take 1 1/2 tab to= 37.5 mg by mouth at bedtime  . hydrochlorothiazide (HYDRODIURIL) 12.5 MG tablet Take 12.5 mg by mouth daily.  Marland Kitchen ipratropium-albuterol (DUONEB) 0.5-2.5 (3) MG/3ML SOLN Take 3 mLs by nebulization every 6 (six) hours as needed.  . loratadine (CLARITIN) 10 MG tablet Take 10 mg by mouth daily.  Marland Kitchen LORazepam (ATIVAN) 0.5 MG tablet Take 1 tablet (0.5 mg total) by mouth 2 (two) times daily.  Marland Kitchen lovastatin (MEVACOR) 40 MG tablet Take 40 mg by mouth at bedtime.  . nitroGLYCERIN (NITROSTAT) 0.4 MG SL tablet Place 0.4 mg under the tongue every 5 (five) minutes as needed for chest pain.   . pantoprazole (PROTONIX) 40 MG tablet Take 40 mg by mouth daily.   . potassium chloride (K-DUR,KLOR-CON) 10 MEQ tablet Take 10 mEq by mouth daily.  . Travoprost, BAK Free, (TRAVATAN) 0.004 % SOLN ophthalmic solution Place 1 drop into both eyes at bedtime. For glaucoma   No facility-administered  encounter medications on file as of 10/05/2017.     Review of Systems   In general she is not complaining any fever or chills continues to lose some weight.  Skin does not complain of rashes or itching has venous stasis changes lower extremities which if anything appear to be somewhat improved.  Head ears eyes nose mouth and throat is not complaining of any sore throat or visual changes or really difficulty swallowing.  Respiratory is not complaining of shortness of breath or cough.  Cardiac does not complain of chest pain lower extremity edema if anything appears to be improving.  GI does not really complain of abdominal discomfort nausea and diarrhea constipation describes somewhat unclear history of vomiting versus possibly coughing.  GU is not complaining of dysuria.  Musculoskeletal does not complain of joint pain today at times will complain  of somewhat diffuse complaints especially of her legs when they are touched.  Neurologic does not complain of dizziness headache or syncope.  Psych does have a history of dementia with anxiety this appears to be relatively stable although she does get anxious at times    Immunization History  Administered Date(s) Administered  . Influenza Whole 06/11/2007, 10/16/2008, 05/21/2009, 05/05/2010  . Influenza-Unspecified 06/05/2014, 06/02/2016, 06/02/2017, 06/02/2017  . Pneumococcal Conjugate-13 06/06/2016  . Pneumococcal Polysaccharide-23 06/11/2007, 06/01/2016  . Pneumococcal-Unspecified 06/06/2016  . Td 06/11/2007  . Tdap 06/24/2017  . Zoster 05/21/2007   Pertinent  Health Maintenance Due  Topic Date Due  . PNA vac Low Risk Adult (2 of 2 - PCV13) 06/06/2025 (Originally 06/06/2017)  . INFLUENZA VACCINE  Completed  . DEXA SCAN  Completed   Fall Risk  05/22/2017  Falls in the past year? No   Functional Status Survey:    Vitals:   10/05/17 1306  BP: (!) 160/80  Pulse: 64  Resp: 20  Temp: 97.8 F (36.6 C)  TempSrc: Oral  SpO2:  94%  Of note manual blood pressure was 136/62 Weight is 166 pounds  Physical Exam In general this is a somewhat obese elderly female in no distress sitting comfortably in her wheelchair.  Her skin is warm and dry.  Eyes visual acuity appears grossly intact.  Her oropharynx is clear mucous membranes moist.  Chest is clear to auscultation there is no labored breathing.  Heart is regular rate and rhythm with occasional irregular beats she has chronic venous stasis edema which appears to be gradually improving.  Her abdomen is obese soft nontender with positive bowel sounds.  Musculoskeletal ambulates in a wheelchair and moves all extremities at baseline lower extremity weakness appears to be baseline complicated with her edema.  Neurologic is grossly intact her speech is clear could not really appreciate any lateralizing findings.  Psych she is pleasant and appropriate and at her baseline Labs reviewed: Recent Labs    09/07/17 0700 09/22/17 0700 09/25/17 0430  NA 137 140 141  K 3.7 3.7 3.5  CL 99* 100* 102  CO2 26 28 29   GLUCOSE 90 96 77  BUN 36* 39* 37*  CREATININE 1.38* 1.39* 1.18*  CALCIUM 9.6 9.7 9.8   No results for input(s): AST, ALT, ALKPHOS, BILITOT, PROT, ALBUMIN in the last 8760 hours. Recent Labs    02/17/17 0707  05/19/17 0800 06/05/17 0700 08/15/17 0730  WBC 4.2   < > 5.8 4.8 4.2  NEUTROABS 2.1  --  3.2 2.7  --   HGB 11.2*   < > 11.2* 10.8* 11.5*  HCT 34.5*   < > 34.0* 33.7* 37.2  MCV 83.5   < > 86.5 87.3 85.1  PLT 211   < > 209 234 245   < > = values in this interval not displayed.   Lab Results  Component Value Date   TSH 1.454 04/10/2015   Lab Results  Component Value Date   HGBA1C 5.5 03/20/2017   Lab Results  Component Value Date   CHOL 234 (H) 09/22/2017   HDL 122 09/22/2017   LDLCALC 99 09/22/2017   TRIG 65 09/22/2017   CHOLHDL 1.9 09/22/2017    Significant Diagnostic Results in last 30 days:  No results found. 1.-History of  weight loss-previously patient has indicated she desired this-but she has had significant weight loss and now is complaining at times of some vomiting-coughing-she does have a history of GERD this was discussed with Dr. Lyndel Safe  and will increase her Protonix to twice daily.  Also will reduce her Aricept down to 5 mg a day suspicious this might be contributing to it as well.  Her weights will have to be monitored closely will check a CMP to check her electrolytes and albumin.  Also consider GI consult if this persists clinically she appears to be doing well actually and at her baseline.  2.  History of renal insufficiency creatinine of 1.18 BUN of 37 on lab done in late January appears to be relatively stable again we will update this she does not appear to be dehydrated.  3.  Hypertension this  continues to be somewhat variable blood pressure today of 136/62 appears stable she does have some higher and lower readings noted however at this point will monitor and continue the hydralazine at current doses as well as hydrochlorothiazide low dose.  She is also on Norvasc 5 mg a day.  [id number]

## 2017-10-06 ENCOUNTER — Encounter (HOSPITAL_COMMUNITY)
Admission: RE | Admit: 2017-10-06 | Discharge: 2017-10-06 | Disposition: A | Discharge status: Home / Self Care | Payer: Medicare Other | Source: Skilled Nursing Facility | Attending: Internal Medicine | Admitting: Internal Medicine

## 2017-10-06 DIAGNOSIS — I509 Heart failure, unspecified: Secondary | ICD-10-CM | POA: Diagnosis not present

## 2017-10-06 DIAGNOSIS — F411 Generalized anxiety disorder: Secondary | ICD-10-CM | POA: Insufficient documentation

## 2017-10-06 DIAGNOSIS — R1312 Dysphagia, oropharyngeal phase: Secondary | ICD-10-CM | POA: Diagnosis not present

## 2017-10-06 DIAGNOSIS — F0390 Unspecified dementia without behavioral disturbance: Secondary | ICD-10-CM | POA: Diagnosis not present

## 2017-10-06 DIAGNOSIS — F329 Major depressive disorder, single episode, unspecified: Secondary | ICD-10-CM | POA: Diagnosis not present

## 2017-10-06 DIAGNOSIS — K219 Gastro-esophageal reflux disease without esophagitis: Secondary | ICD-10-CM | POA: Insufficient documentation

## 2017-10-06 LAB — CBC WITH DIFFERENTIAL/PLATELET
Basophils Absolute: 0 10*3/uL (ref 0.0–0.1)
Basophils Relative: 1 %
EOS ABS: 0.1 10*3/uL (ref 0.0–0.7)
EOS PCT: 3 %
HCT: 38.4 % (ref 36.0–46.0)
Hemoglobin: 12 g/dL (ref 12.0–15.0)
Lymphocytes Relative: 33 %
Lymphs Abs: 1.3 10*3/uL (ref 0.7–4.0)
MCH: 26.2 pg (ref 26.0–34.0)
MCHC: 31.3 g/dL (ref 30.0–36.0)
MCV: 83.8 fL (ref 78.0–100.0)
MONO ABS: 0.5 10*3/uL (ref 0.1–1.0)
MONOS PCT: 14 %
Neutro Abs: 2 10*3/uL (ref 1.7–7.7)
Neutrophils Relative %: 49 %
PLATELETS: 174 10*3/uL (ref 150–400)
RBC: 4.58 MIL/uL (ref 3.87–5.11)
RDW: 16.7 % — ABNORMAL HIGH (ref 11.5–15.5)
WBC: 3.9 10*3/uL — ABNORMAL LOW (ref 4.0–10.5)

## 2017-10-06 LAB — COMPREHENSIVE METABOLIC PANEL
ALBUMIN: 3.9 g/dL (ref 3.5–5.0)
ALK PHOS: 74 U/L (ref 38–126)
ALT: 12 U/L — ABNORMAL LOW (ref 14–54)
AST: 20 U/L (ref 15–41)
Anion gap: 10 (ref 5–15)
BUN: 41 mg/dL — AB (ref 6–20)
CALCIUM: 9.6 mg/dL (ref 8.9–10.3)
CO2: 27 mmol/L (ref 22–32)
Chloride: 103 mmol/L (ref 101–111)
Creatinine, Ser: 1.48 mg/dL — ABNORMAL HIGH (ref 0.44–1.00)
GFR calc Af Amer: 34 mL/min — ABNORMAL LOW (ref >60)
GFR calc non Af Amer: 29 mL/min — ABNORMAL LOW (ref >60)
GLUCOSE: 92 mg/dL (ref 65–99)
Potassium: 3.8 mmol/L (ref 3.5–5.1)
Sodium: 140 mmol/L (ref 135–145)
TOTAL PROTEIN: 7.6 g/dL (ref 6.5–8.1)
Total Bilirubin: 0.5 mg/dL (ref 0.3–1.2)

## 2017-10-10 DIAGNOSIS — F0390 Unspecified dementia without behavioral disturbance: Secondary | ICD-10-CM | POA: Diagnosis not present

## 2017-10-10 DIAGNOSIS — F4310 Post-traumatic stress disorder, unspecified: Secondary | ICD-10-CM | POA: Diagnosis not present

## 2017-10-10 DIAGNOSIS — R1312 Dysphagia, oropharyngeal phase: Secondary | ICD-10-CM | POA: Diagnosis not present

## 2017-10-10 DIAGNOSIS — K219 Gastro-esophageal reflux disease without esophagitis: Secondary | ICD-10-CM | POA: Diagnosis not present

## 2017-10-10 DIAGNOSIS — F411 Generalized anxiety disorder: Secondary | ICD-10-CM | POA: Diagnosis not present

## 2017-10-10 DIAGNOSIS — F320 Major depressive disorder, single episode, mild: Secondary | ICD-10-CM | POA: Diagnosis not present

## 2017-10-10 DIAGNOSIS — F428 Other obsessive-compulsive disorder: Secondary | ICD-10-CM | POA: Diagnosis not present

## 2017-10-11 ENCOUNTER — Other Ambulatory Visit: Payer: Self-pay

## 2017-10-11 MED ORDER — LORAZEPAM 0.5 MG PO TABS: 0.5000 mg | ORAL_TABLET | Freq: Two times a day (BID) | ORAL | 0 refills | Status: DC

## 2017-10-11 NOTE — Telephone Encounter (Signed)
RX Fax for [facility]@ [phone]  

## 2017-10-12 DIAGNOSIS — R1312 Dysphagia, oropharyngeal phase: Secondary | ICD-10-CM | POA: Diagnosis not present

## 2017-10-12 DIAGNOSIS — F0390 Unspecified dementia without behavioral disturbance: Secondary | ICD-10-CM | POA: Diagnosis not present

## 2017-10-12 DIAGNOSIS — K219 Gastro-esophageal reflux disease without esophagitis: Secondary | ICD-10-CM | POA: Diagnosis not present

## 2017-10-17 DIAGNOSIS — F4310 Post-traumatic stress disorder, unspecified: Secondary | ICD-10-CM | POA: Diagnosis not present

## 2017-10-17 DIAGNOSIS — R1312 Dysphagia, oropharyngeal phase: Secondary | ICD-10-CM | POA: Diagnosis not present

## 2017-10-17 DIAGNOSIS — F320 Major depressive disorder, single episode, mild: Secondary | ICD-10-CM | POA: Diagnosis not present

## 2017-10-17 DIAGNOSIS — F411 Generalized anxiety disorder: Secondary | ICD-10-CM | POA: Diagnosis not present

## 2017-10-17 DIAGNOSIS — K219 Gastro-esophageal reflux disease without esophagitis: Secondary | ICD-10-CM | POA: Diagnosis not present

## 2017-10-17 DIAGNOSIS — F0390 Unspecified dementia without behavioral disturbance: Secondary | ICD-10-CM | POA: Diagnosis not present

## 2017-10-17 DIAGNOSIS — F428 Other obsessive-compulsive disorder: Secondary | ICD-10-CM | POA: Diagnosis not present

## 2017-10-18 DIAGNOSIS — F0390 Unspecified dementia without behavioral disturbance: Secondary | ICD-10-CM | POA: Diagnosis not present

## 2017-10-18 DIAGNOSIS — K219 Gastro-esophageal reflux disease without esophagitis: Secondary | ICD-10-CM | POA: Diagnosis not present

## 2017-10-18 DIAGNOSIS — R1312 Dysphagia, oropharyngeal phase: Secondary | ICD-10-CM | POA: Diagnosis not present

## 2017-10-19 DIAGNOSIS — R1312 Dysphagia, oropharyngeal phase: Secondary | ICD-10-CM | POA: Diagnosis not present

## 2017-10-19 DIAGNOSIS — F0390 Unspecified dementia without behavioral disturbance: Secondary | ICD-10-CM | POA: Diagnosis not present

## 2017-10-19 DIAGNOSIS — K219 Gastro-esophageal reflux disease without esophagitis: Secondary | ICD-10-CM | POA: Diagnosis not present

## 2017-10-20 DIAGNOSIS — R1312 Dysphagia, oropharyngeal phase: Secondary | ICD-10-CM | POA: Diagnosis not present

## 2017-10-20 DIAGNOSIS — F0390 Unspecified dementia without behavioral disturbance: Secondary | ICD-10-CM | POA: Diagnosis not present

## 2017-10-20 DIAGNOSIS — K219 Gastro-esophageal reflux disease without esophagitis: Secondary | ICD-10-CM | POA: Diagnosis not present

## 2017-10-22 DIAGNOSIS — R1312 Dysphagia, oropharyngeal phase: Secondary | ICD-10-CM | POA: Diagnosis not present

## 2017-10-22 DIAGNOSIS — K219 Gastro-esophageal reflux disease without esophagitis: Secondary | ICD-10-CM | POA: Diagnosis not present

## 2017-10-22 DIAGNOSIS — F0390 Unspecified dementia without behavioral disturbance: Secondary | ICD-10-CM | POA: Diagnosis not present

## 2017-10-24 DIAGNOSIS — K219 Gastro-esophageal reflux disease without esophagitis: Secondary | ICD-10-CM | POA: Diagnosis not present

## 2017-10-24 DIAGNOSIS — F0390 Unspecified dementia without behavioral disturbance: Secondary | ICD-10-CM | POA: Diagnosis not present

## 2017-10-24 DIAGNOSIS — F411 Generalized anxiety disorder: Secondary | ICD-10-CM | POA: Diagnosis not present

## 2017-10-24 DIAGNOSIS — F4310 Post-traumatic stress disorder, unspecified: Secondary | ICD-10-CM | POA: Diagnosis not present

## 2017-10-24 DIAGNOSIS — F320 Major depressive disorder, single episode, mild: Secondary | ICD-10-CM | POA: Diagnosis not present

## 2017-10-24 DIAGNOSIS — F428 Other obsessive-compulsive disorder: Secondary | ICD-10-CM | POA: Diagnosis not present

## 2017-10-24 DIAGNOSIS — R1312 Dysphagia, oropharyngeal phase: Secondary | ICD-10-CM | POA: Diagnosis not present

## 2017-10-26 DIAGNOSIS — K219 Gastro-esophageal reflux disease without esophagitis: Secondary | ICD-10-CM | POA: Diagnosis not present

## 2017-10-26 DIAGNOSIS — R1312 Dysphagia, oropharyngeal phase: Secondary | ICD-10-CM | POA: Diagnosis not present

## 2017-10-26 DIAGNOSIS — F0390 Unspecified dementia without behavioral disturbance: Secondary | ICD-10-CM | POA: Diagnosis not present

## 2017-10-27 DIAGNOSIS — K219 Gastro-esophageal reflux disease without esophagitis: Secondary | ICD-10-CM | POA: Diagnosis not present

## 2017-10-27 DIAGNOSIS — F0390 Unspecified dementia without behavioral disturbance: Secondary | ICD-10-CM | POA: Diagnosis not present

## 2017-10-27 DIAGNOSIS — R1312 Dysphagia, oropharyngeal phase: Secondary | ICD-10-CM | POA: Diagnosis not present

## 2017-10-30 DIAGNOSIS — K219 Gastro-esophageal reflux disease without esophagitis: Secondary | ICD-10-CM | POA: Diagnosis not present

## 2017-10-30 DIAGNOSIS — F0390 Unspecified dementia without behavioral disturbance: Secondary | ICD-10-CM | POA: Diagnosis not present

## 2017-10-30 DIAGNOSIS — R1312 Dysphagia, oropharyngeal phase: Secondary | ICD-10-CM | POA: Diagnosis not present

## 2017-10-31 DIAGNOSIS — F411 Generalized anxiety disorder: Secondary | ICD-10-CM | POA: Diagnosis not present

## 2017-10-31 DIAGNOSIS — K219 Gastro-esophageal reflux disease without esophagitis: Secondary | ICD-10-CM | POA: Diagnosis not present

## 2017-10-31 DIAGNOSIS — F4310 Post-traumatic stress disorder, unspecified: Secondary | ICD-10-CM | POA: Diagnosis not present

## 2017-10-31 DIAGNOSIS — F428 Other obsessive-compulsive disorder: Secondary | ICD-10-CM | POA: Diagnosis not present

## 2017-10-31 DIAGNOSIS — F0390 Unspecified dementia without behavioral disturbance: Secondary | ICD-10-CM | POA: Diagnosis not present

## 2017-10-31 DIAGNOSIS — R1312 Dysphagia, oropharyngeal phase: Secondary | ICD-10-CM | POA: Diagnosis not present

## 2017-10-31 DIAGNOSIS — F320 Major depressive disorder, single episode, mild: Secondary | ICD-10-CM | POA: Diagnosis not present

## 2017-11-01 DIAGNOSIS — K219 Gastro-esophageal reflux disease without esophagitis: Secondary | ICD-10-CM | POA: Diagnosis not present

## 2017-11-01 DIAGNOSIS — R1312 Dysphagia, oropharyngeal phase: Secondary | ICD-10-CM | POA: Diagnosis not present

## 2017-11-01 DIAGNOSIS — F0390 Unspecified dementia without behavioral disturbance: Secondary | ICD-10-CM | POA: Diagnosis not present

## 2017-11-02 ENCOUNTER — Encounter: Payer: Self-pay | Admitting: Internal Medicine

## 2017-11-02 ENCOUNTER — Non-Acute Institutional Stay (SKILLED_NURSING_FACILITY): Payer: Medicare Other | Admitting: Internal Medicine

## 2017-11-02 DIAGNOSIS — F329 Major depressive disorder, single episode, unspecified: Secondary | ICD-10-CM

## 2017-11-02 DIAGNOSIS — N183 Chronic kidney disease, stage 3 unspecified: Secondary | ICD-10-CM

## 2017-11-02 DIAGNOSIS — R1312 Dysphagia, oropharyngeal phase: Secondary | ICD-10-CM | POA: Diagnosis not present

## 2017-11-02 DIAGNOSIS — F0390 Unspecified dementia without behavioral disturbance: Secondary | ICD-10-CM | POA: Diagnosis not present

## 2017-11-02 DIAGNOSIS — K219 Gastro-esophageal reflux disease without esophagitis: Secondary | ICD-10-CM

## 2017-11-02 DIAGNOSIS — F32A Depression, unspecified: Secondary | ICD-10-CM

## 2017-11-02 DIAGNOSIS — I5032 Chronic diastolic (congestive) heart failure: Secondary | ICD-10-CM | POA: Diagnosis not present

## 2017-11-02 DIAGNOSIS — F411 Generalized anxiety disorder: Secondary | ICD-10-CM | POA: Diagnosis not present

## 2017-11-02 DIAGNOSIS — I10 Essential (primary) hypertension: Secondary | ICD-10-CM

## 2017-11-02 DIAGNOSIS — E785 Hyperlipidemia, unspecified: Secondary | ICD-10-CM

## 2017-11-02 NOTE — Progress Notes (Addendum)
Location:   [place] Room Number: 122/D Place of Service:  SNF (31) Provider:  Tana Conch, PA-C  Patient Care Team: [name] as PCP - General (Internal Medicine) [name], MD as Consulting Physician (Geriatric Medicine)  Extended Emergency Contact Information Primary Emergency Contact: [name], [place] [address] [name] of [place] Phone: [phone] Relation: Daughter Secondary Emergency Contact: [name] States of [place] Phone: [phone] Relation: Son  Code Status:  DNR Goals of care: Advanced Directive information Advanced Directives 11/02/2017  Does Patient Have a Medical Advance Directive? Yes  Type of Advance Directive Out of facility DNR (pink MOST or yellow form)  Does patient want to make changes to medical advance directive? No - Patient declined  Copy of [place] in Chart? No - copy requested  Pre-existing out of facility DNR order (yellow form or pink MOST form) -     Chief Complaint  Patient presents with  . Medical Management of Chronic Issues    Routine Visit,     HPI:  Pt is a 84 y.o. female seen today for medical management of chronic diseases.    Patient has H/o Hypertension, Distal DVT, Chronic venous stasis,  Diastolic CHF chronic Renal disaese and GERD,Glaucoma,Dementia and Anxiety  Patient doing well in facility. She had lost some weight almost 15 lbs intentionally by cutting on her Calorie intake. Her weight has now stabilized. She is very happy with her weight loss and feels good abut it. Her weight today is 168 lbs and it is stable. Denies any SOB and is off her oxygen now. Her mood is better. There are no new Nursing issues. Past Medical History:  Diagnosis Date  . Anxiety   . Cellulitis 2016  . CHF (congestive heart failure) ([place])   . Chronic kidney disease   . Dementia   . Dysphagia   . Gait  abnormality   . GERD (gastroesophageal reflux disease)   . Glaucoma   . Gout   . Hyperlipidemia   . Hypertension   . Muscle weakness   . Myocardial infarction [facility]) [place]  . Osteoarthritis   . Syncope    Past Surgical History:  Procedure Laterality Date  . APPENDECTOMY    . CATARACT EXTRACTION W/PHACO  08/04/2011   Procedure: CATARACT EXTRACTION PHACO AND INTRAOCULAR LENS PLACEMENT (IOC);  Surgeon: Tonny Branch;  Location: [facility];  Service: Ophthalmology;  Laterality: Right;  CDE=18.53  . CATARACT EXTRACTION W/PHACO  08/25/2011   Procedure: CATARACT EXTRACTION PHACO AND INTRAOCULAR LENS PLACEMENT (IOC);  Surgeon: Tonny Branch;  Location: [facility];  Service: Ophthalmology;  Laterality: Left;  CDE:14.76  . TONSILLECTOMY      Allergies  Allergen Reactions  . Bee Venom Anaphylaxis  . Angiotensin Receptor Blockers     Tongue swelling.  . Aspirin     Directed by physician  . Cabbage Other (See Comments)    Certain foods due to gout flares  . Latex Rash    Outpatient Encounter Medications as of 11/02/2017  Medication Sig  . acetaminophen (TYLENOL) 500 MG tablet Take 1,000 mg by mouth 2 (two) times daily. For leg pain and arthritis. May have additional 1000 mg for break through pain  . amLODipine (NORVASC) 5 MG tablet Take 5 mg by mouth daily.  . Brinzolamide-Brimonidine (SIMBRINZA) 1-0.2 % SUSP Apply to eye. One  drop into  both eyes three times a day  . Calcium Carbonate-Vitamin D (CALCIUM-VITAMIN D) 500-200 MG-UNIT tablet Take 1 tablet by mouth 2 (two) times daily.  . citalopram (CELEXA) 20 MG tablet Take 20 mg by mouth daily.  . cycloSPORINE (RESTASIS) 0.05 % ophthalmic emulsion Place 1 drop into both eyes 2 (two) times daily.  Marland Kitchen docusate sodium (COLACE) 100 MG capsule Take 100 mg by mouth 2 (two) times daily.  Marland Kitchen donepezil (ARICEPT) 10 MG tablet Take 5 mg by mouth daily. For dementia  . hydrALAZINE (APRESOLINE) 25 MG tablet Take 25 mg by mouth 2 (two) times daily.   .  hydrALAZINE (APRESOLINE) 25 MG tablet Take 1 1/2 tab to= 37.5 mg by mouth at bedtime  . hydrochlorothiazide (HYDRODIURIL) 12.5 MG tablet Take 12.5 mg by mouth daily.  Marland Kitchen ipratropium-albuterol (DUONEB) 0.5-2.5 (3) MG/3ML SOLN Take 3 mLs by nebulization every 6 (six) hours as needed.  . loratadine (CLARITIN) 10 MG tablet Take 10 mg by mouth daily.  Marland Kitchen LORazepam (ATIVAN) 0.5 MG tablet Take 1 tablet (0.5 mg total) by mouth 2 (two) times daily.  Marland Kitchen lovastatin (MEVACOR) 40 MG tablet Take 40 mg by mouth at bedtime.  . nitroGLYCERIN (NITROSTAT) 0.4 MG SL tablet Place 0.4 mg under the tongue every 5 (five) minutes as needed for chest pain.   Marland Kitchen oseltamivir (TAMIFLU) 30 MG capsule Take 30 mg by mouth every other day. From 10/25/2017-11/03/2017  . pantoprazole (PROTONIX) 40 MG tablet Take 40 mg by mouth 2 (two) times daily.   . potassium chloride (K-DUR,KLOR-CON) 10 MEQ tablet Take 10 mEq by mouth daily.  . Travoprost, BAK Free, (TRAVATAN) 0.004 % SOLN ophthalmic solution Place 1 drop into both eyes at bedtime. For glaucoma   No facility-administered encounter medications on file as of 11/02/2017.      Review of Systems  Review of Systems  Constitutional: Negative for activity change, appetite change, chills, diaphoresis, fatigue and fever.  HENT: Negative for mouth sores, postnasal drip, rhinorrhea, sinus pain and sore throat.   Respiratory: Negative for apnea, cough, chest tightness, shortness of breath and wheezing.   Cardiovascular: Negative for chest pain, palpitations and leg swelling.  Gastrointestinal: Negative for abdominal distention, abdominal pain, constipation, diarrhea, nausea and vomiting.  Genitourinary: Negative for dysuria and frequency.  Musculoskeletal: Negative for arthralgias, joint swelling and myalgias.  Skin: Negative for rash.  Neurological: Negative for dizziness, syncope, weakness, light-headedness and numbness.  Psychiatric/Behavioral: Negative for behavioral problems, confusion  and sleep disturbance.     Immunization History  Administered Date(s) Administered  . Influenza Whole 06/11/2007, 10/16/2008, 05/21/2009, 05/05/2010  . Influenza-Unspecified 06/05/2014, 06/02/2016, 06/02/2017, 06/02/2017  . Pneumococcal Conjugate-13 06/06/2016  . Pneumococcal Polysaccharide-23 06/11/2007, 06/01/2016  . Pneumococcal-Unspecified 06/06/2016  . Td 06/11/2007  . Tdap 06/24/2017  . Zoster 05/21/2007   Pertinent  Health Maintenance Due  Topic Date Due  . PNA vac Low Risk Adult (2 of 2 - PCV13) 06/06/2025 (Originally 06/06/2017)  . INFLUENZA VACCINE  Completed  . DEXA SCAN  Completed   Fall Risk  05/22/2017  Falls in the past year? No   Functional Status Survey:    Vitals:   11/02/17 1025  BP: 134/68  Pulse: 70  Resp: 20  Temp: 98.1 F (36.7 C)  TempSrc: Oral  SpO2: 96%  Weight: 168 lb (76.2 kg)  Height: 4\' 11"  (1.499 m)   Body mass index is 33.93 kg/m. Physical Exam  Constitutional: She is oriented to person, place, and time. She appears well-developed and well-nourished.  HENT:  Head: Normocephalic.  Mouth/Throat: Oropharynx is clear and moist.  Eyes: Pupils are equal, round, and reactive to light.  Neck: Neck supple.  Cardiovascular: Normal rate and normal heart sounds.  No murmur heard. Pulmonary/Chest: Effort normal and breath sounds normal. No respiratory distress. She has no wheezes. She has no rales.  Abdominal: Soft. Bowel sounds are normal. She exhibits no distension. There is no tenderness. There is no rebound.  Musculoskeletal:  Chronic Venous changes Bilateral  Lymphadenopathy:    She has no cervical adenopathy.  Neurological: She is alert and oriented to person, place, and time.  Skin: Skin is warm and dry.  Psychiatric: She has a normal mood and affect. Her behavior is normal. Thought content normal.    Labs reviewed: Recent Labs    09/22/17 0700 09/25/17 0430 10/06/17 0743  NA 140 141 140  K 3.7 3.5 3.8  CL 100* 102 103  CO2  28 29 27   GLUCOSE 96 77 92  BUN 39* 37* 41*  CREATININE 1.39* 1.18* 1.48*  CALCIUM 9.7 9.8 9.6   Recent Labs    10/06/17 0743  AST 20  ALT 12*  ALKPHOS 74  BILITOT 0.5  PROT 7.6  ALBUMIN 3.9   Recent Labs    05/19/17 0800 06/05/17 0700 08/15/17 0730 10/06/17 0743  WBC 5.8 4.8 4.2 3.9*  NEUTROABS 3.2 2.7  --  2.0  HGB 11.2* 10.8* 11.5* 12.0  HCT 34.0* 33.7* 37.2 38.4  MCV 86.5 87.3 85.1 83.8  PLT 209 234 245 174   Lab Results  Component Value Date   TSH 1.454 04/10/2015   Lab Results  Component Value Date   HGBA1C 5.5 03/20/2017   Lab Results  Component Value Date   CHOL 234 (H) 09/22/2017   HDL 122 09/22/2017   LDLCALC 99 09/22/2017   TRIG 65 09/22/2017   CHOLHDL 1.9 09/22/2017    Significant Diagnostic Results in last 30 days:  No results found.  Assessment/Plan  Essential Hypertension Patient BP has been controlled recently She has very Labile BP with swings of SBP of 180-120 But recently it has been staying stable On Hydralazine and HCTZ and Norvasc  Chronic diastolic congestive heart failure  Her weight is stable.  She does have some swelling in her legs. She continues to refuse Lasix  Stage 3 chronic kidney disease  Creatinine is staying stable.  Her BUN was elevated recently. Can be due to HCTZ Will repeat BMP  Anxiety state  patient is doing well She is on Celexa and Ativan.   Any titration can make her symptoms recur  Hyperlipidemia, LDL 99 01/19 on lovastatin GERD Change her Protonix to Zantac  Dementia  stable on Aricept   Family/ staff Communication:   Labs/tests ordered:  Repeat CBC and BMP  Total time spent in this patient care encounter was _25 minutes; greater than 50% of the visit spent counseling patient, reviewing records , Labs and coordinating care for problems addressed at this encounter.

## 2017-11-03 ENCOUNTER — Encounter (HOSPITAL_COMMUNITY)
Admission: RE | Admit: 2017-11-03 | Discharge: 2017-11-03 | Disposition: A | Discharge status: Home / Self Care | Payer: Medicare Other | Source: Skilled Nursing Facility | Attending: Internal Medicine | Admitting: Internal Medicine

## 2017-11-03 DIAGNOSIS — I509 Heart failure, unspecified: Secondary | ICD-10-CM | POA: Diagnosis not present

## 2017-11-03 DIAGNOSIS — F419 Anxiety disorder, unspecified: Secondary | ICD-10-CM | POA: Insufficient documentation

## 2017-11-03 DIAGNOSIS — R609 Edema, unspecified: Secondary | ICD-10-CM | POA: Insufficient documentation

## 2017-11-03 DIAGNOSIS — R1312 Dysphagia, oropharyngeal phase: Secondary | ICD-10-CM | POA: Diagnosis not present

## 2017-11-03 DIAGNOSIS — K210 Gastro-esophageal reflux disease with esophagitis: Secondary | ICD-10-CM | POA: Insufficient documentation

## 2017-11-03 DIAGNOSIS — F339 Major depressive disorder, recurrent, unspecified: Secondary | ICD-10-CM | POA: Insufficient documentation

## 2017-11-03 DIAGNOSIS — F0390 Unspecified dementia without behavioral disturbance: Secondary | ICD-10-CM | POA: Diagnosis not present

## 2017-11-03 DIAGNOSIS — K219 Gastro-esophageal reflux disease without esophagitis: Secondary | ICD-10-CM | POA: Diagnosis not present

## 2017-11-03 LAB — CBC
HCT: 38.3 % (ref 36.0–46.0)
HEMOGLOBIN: 11.8 g/dL — AB (ref 12.0–15.0)
MCH: 26.1 pg (ref 26.0–34.0)
MCHC: 30.8 g/dL (ref 30.0–36.0)
MCV: 84.7 fL (ref 78.0–100.0)
PLATELETS: 213 10*3/uL (ref 150–400)
RBC: 4.52 MIL/uL (ref 3.87–5.11)
RDW: 17.7 % — ABNORMAL HIGH (ref 11.5–15.5)
WBC: 4.3 10*3/uL (ref 4.0–10.5)

## 2017-11-03 LAB — BASIC METABOLIC PANEL
ANION GAP: 11 (ref 5–15)
BUN: 35 mg/dL — AB (ref 6–20)
CHLORIDE: 101 mmol/L (ref 101–111)
CO2: 27 mmol/L (ref 22–32)
Calcium: 9.7 mg/dL (ref 8.9–10.3)
Creatinine, Ser: 1.31 mg/dL — ABNORMAL HIGH (ref 0.44–1.00)
GFR calc Af Amer: 39 mL/min — ABNORMAL LOW (ref >60)
GFR, EST NON AFRICAN AMERICAN: 34 mL/min — AB (ref >60)
Glucose, Bld: 84 mg/dL (ref 65–99)
POTASSIUM: 3.7 mmol/L (ref 3.5–5.1)
SODIUM: 139 mmol/L (ref 135–145)

## 2017-11-13 ENCOUNTER — Non-Acute Institutional Stay (SKILLED_NURSING_FACILITY): Payer: Medicare Other | Admitting: Internal Medicine

## 2017-11-13 ENCOUNTER — Encounter: Payer: Self-pay | Admitting: Internal Medicine

## 2017-11-13 DIAGNOSIS — I10 Essential (primary) hypertension: Secondary | ICD-10-CM | POA: Diagnosis not present

## 2017-11-13 DIAGNOSIS — H547 Unspecified visual loss: Secondary | ICD-10-CM

## 2017-11-13 DIAGNOSIS — R609 Edema, unspecified: Secondary | ICD-10-CM

## 2017-11-13 NOTE — Progress Notes (Signed)
Location:   [facility] Room Number: 122/D Place of Service:  SNF [phone]) Provider:  Mickel Duhamel, PA-C  Patient Care Team: [name] as PCP - General (Internal Medicine) [name], MD as Consulting Physician (Geriatric Medicine)  Extended Emergency Contact Information Primary Emergency Contact: [name], [place] [address] [name] of [place] Phone: [phone] Relation: Daughter Secondary Emergency Contact: [name] States of [place] Phone: [phone] Relation: Son  Code Status:  DNR Goals of care: Advanced Directive information Advanced Directives 11/13/2017  Does Patient Have a Medical Advance Directive? Yes  Type of Advance Directive Out of facility DNR (pink MOST or yellow form)  Does patient want to make changes to medical advance directive? No - Patient declined  Copy of [place] in Chart? No - copy requested  Pre-existing out of facility DNR order (yellow form or pink MOST form) -     Chief Complaint  Patient presents with  . Acute Visit    Patients c/oLeg Swelling    HPI:  Pt is a 84 y.o. female seen today for an acute visit for complaints of leg swelling. Patient is a long-term resident of facility and has a history of venous stasis edema grade 1 diastolic CHF renal disease GERD glaucoma dementia and anxiety as well as distant history of a left leg distal DVT and hypertension.  In the past we have tried to encourage her to take Lasix but she has refused this.  She is on low-dose hydrochlorothiazide.  Apparently she complained to nursing staff about  lower extremity edema-however on evaluation today this appears to be relatively baseline and she basically has said the same thing--she is a compared to last October has lost about 20 pounds.  At one point she was 190 pounds back in October but went down to 162 pounds in early February she slowly  gained a small amount of weight currently 171 pounds which is been stable for a couple weeks.  She did desire losing some weight.  Today she tells me she thinks she swells up at times when it is cold in the room- She is currently keeping it quite warm.  And she thinks this helps with the swelling-she gives a vague history of her tongue swelling at one point although nursing staff has not really noted this  Currently she does not really have any acute complaints other than thinking she swells up at times when it is too cold in the room.  I do note her listed blood pressure is a systolically in the 277O but she has quite a bit of variability actually when I took it I got 116/58--- She has had quite a bit of variability in the past as well-she is on numerous agents including hydralazine 25 mg twice a day and 37.5 mg at night-hydrochlorothiazide 12.5 mg every morning and Norvasc 5 mg a day Currently she is not really complaining of any increased shortness of breath beyond baseline or cough Or chest pain.     Past Medical History:  Diagnosis Date  . Anxiety   . Cellulitis 2016  . CHF (congestive heart failure) ([place])   . Chronic kidney disease   . Dementia   . Dysphagia   . Gait abnormality   . GERD (gastroesophageal reflux disease)   . Glaucoma   . Gout   . Hyperlipidemia   . Hypertension   .  Muscle weakness   . Myocardial infarction [facility]) [place]  . Osteoarthritis   . Syncope    Past Surgical History:  Procedure Laterality Date  . APPENDECTOMY    . CATARACT EXTRACTION W/PHACO  08/04/2011   Procedure: CATARACT EXTRACTION PHACO AND INTRAOCULAR LENS PLACEMENT (IOC);  Surgeon: Tonny Branch;  Location: [facility];  Service: Ophthalmology;  Laterality: Right;  CDE=18.53  . CATARACT EXTRACTION W/PHACO  08/25/2011   Procedure: CATARACT EXTRACTION PHACO AND INTRAOCULAR LENS PLACEMENT (IOC);  Surgeon: Tonny Branch;  Location: [facility];  Service: Ophthalmology;  Laterality: Left;   CDE:14.76  . TONSILLECTOMY      Allergies  Allergen Reactions  . Bee Venom Anaphylaxis  . Angiotensin Receptor Blockers     Tongue swelling.  . Aspirin     Directed by physician  . Cabbage Other (See Comments)    Certain foods due to gout flares  . Latex Rash    Outpatient Encounter Medications as of 11/13/2017  Medication Sig  . acetaminophen (TYLENOL) 500 MG tablet Take 1,000 mg by mouth 2 (two) times daily. For leg pain and arthritis. May have additional 1000 mg for break through pain  . amLODipine (NORVASC) 5 MG tablet Take 5 mg by mouth daily.  . Brinzolamide-Brimonidine (SIMBRINZA) 1-0.2 % SUSP Apply to eye. One  drop into both eyes three times a day  . Calcium Carbonate-Vitamin D (CALCIUM-VITAMIN D) 500-200 MG-UNIT tablet Take 1 tablet by mouth 2 (two) times daily.  . citalopram (CELEXA) 20 MG tablet Take 20 mg by mouth daily.  . cycloSPORINE (RESTASIS) 0.05 % ophthalmic emulsion Place 1 drop into both eyes 2 (two) times daily.  Marland Kitchen docusate sodium (COLACE) 100 MG capsule Take 100 mg by mouth 2 (two) times daily.  Marland Kitchen donepezil (ARICEPT) 10 MG tablet Take 5 mg by mouth daily. For dementia  . hydrALAZINE (APRESOLINE) 25 MG tablet Take 25 mg by mouth 2 (two) times daily.   . hydrALAZINE (APRESOLINE) 25 MG tablet Take 1 1/2 tab to= 37.5 mg by mouth at bedtime  . hydrochlorothiazide (HYDRODIURIL) 12.5 MG tablet Take 12.5 mg by mouth daily.  Marland Kitchen ipratropium-albuterol (DUONEB) 0.5-2.5 (3) MG/3ML SOLN Take 3 mLs by nebulization every 6 (six) hours as needed.  . loratadine (CLARITIN) 10 MG tablet Take 10 mg by mouth daily.  Marland Kitchen LORazepam (ATIVAN) 0.5 MG tablet Take 1 tablet (0.5 mg total) by mouth 2 (two) times daily.  Marland Kitchen lovastatin (MEVACOR) 40 MG tablet Take 40 mg by mouth at bedtime.  . nitroGLYCERIN (NITROSTAT) 0.4 MG SL tablet Place 0.4 mg under the tongue every 5 (five) minutes as needed for chest pain.   . potassium chloride (K-DUR,KLOR-CON) 10 MEQ tablet Take 10 mEq by mouth daily.  .  ranitidine (ZANTAC) 150 MG tablet Take 150 mg by mouth daily.  . Travoprost, BAK Free, (TRAVATAN) 0.004 % SOLN ophthalmic solution Place 1 drop into both eyes at bedtime. For glaucoma  . [DISCONTINUED] oseltamivir (TAMIFLU) 30 MG capsule Take 30 mg by mouth every other day. From 10/25/2017-11/03/2017  . [DISCONTINUED] pantoprazole (PROTONIX) 40 MG tablet Take 40 mg by mouth 2 (two) times daily.    No facility-administered encounter medications on file as of 11/13/2017.     Review of Systems   In general she is not complaining of any fever chills or weight appears to be stable to slowly increasing.  Skin is not complaining of rashes or itching does have venous stasis changes lower extremities.  Head ears eyes nose  mouth and throat is not complaining of sore throat or difficulty swallowing. She does state that she feels the vision in her right eye is getting gradually a bit worse  Respiratory is not complaining of shortness of breath at this point for cough.  Cardiac is not complaining of chest pain does note edema but this appears to be fairly baseline.  GI is not complaining currently of abdominal pain at times will complain of some dysphagia does not complain of nausea vomiting diarrhea constipation.  GU is not complaining of dysuria.  Musculoskeletal is not complaining of joint pain.  Neurologic does not complain of dizziness headache or syncope at this time.  And psych does have a history of anxiety and depression which appears fairly well controlled although she does get anxious at times  Immunization History  Administered Date(s) Administered  . Influenza Whole 06/11/2007, 10/16/2008, 05/21/2009, 05/05/2010  . Influenza-Unspecified 06/05/2014, 06/02/2016, 06/02/2017, 06/02/2017  . Pneumococcal Conjugate-13 06/06/2016  . Pneumococcal Polysaccharide-23 06/11/2007, 06/01/2016  . Pneumococcal-Unspecified 06/06/2016  . Td 06/11/2007  . Tdap 06/24/2017  . Zoster 05/21/2007    Pertinent  Health Maintenance Due  Topic Date Due  . PNA vac Low Risk Adult (2 of 2 - PCV13) 06/06/2025 (Originally 06/06/2017)  . INFLUENZA VACCINE  Completed  . DEXA SCAN  Completed   Fall Risk  05/22/2017  Falls in the past year? No   Functional Status Survey:    Vitals:   11/13/17 1540  BP: (!) 162/87  Pulse: 68  Resp: 20  Temp: 97.6 F (36.4 C)  TempSrc: Oral  SpO2: 93%  Of note manual blood pressure is 116/58 Weight is 171 pounds Physical Exam   In general this is a pleasant elderly female in no distress sitting comfortably in her chair.  She is a bit anxious which is not unusual  Her skin is warm and dry she does have chronic venous stasis changes lower extremities without any increased erythema from baseline.  Eyes visual acuity appears grossly intact bilaterally sclera and conjunctive are clear pupils are reactive she reports at times she has some blurriness of her right eye which has gradually increased over some time   Oropharynx is clear mucous membranes moist.  Heart is regular rate and rhythm without murmur gallop or rub she has baseline venous stasis changes lower extremity edema with some scaly patches.  I do not really see any concerning erythema.  Abdomen is obese soft nontender with positive bowel sounds.  Musculoskeletal does ambulate in a wheelchair strength appears to be intact all 4 extremities has lower extremity weakness with a history of edema.  Neurologic is grossly intact her speech is clear could not really appreciate any lateralizing findings.  Psych she is pleasant and appropriate a bit anxious but not acutely so.   Labs reviewed: Recent Labs    09/25/17 0430 10/06/17 0743 11/03/17 0700  NA 141 140 139  K 3.5 3.8 3.7  CL 102 103 101  CO2 29 27 27   GLUCOSE 77 92 84  BUN 37* 41* 35*  CREATININE 1.18* 1.48* 1.31*  CALCIUM 9.8 9.6 9.7   Recent Labs    10/06/17 0743  AST 20  ALT 12*  ALKPHOS 74  BILITOT 0.5  PROT 7.6   ALBUMIN 3.9   Recent Labs    05/19/17 0800 06/05/17 0700 08/15/17 0730 10/06/17 0743 11/03/17 0700  WBC 5.8 4.8 4.2 3.9* 4.3  NEUTROABS 3.2 2.7  --  2.0  --   HGB 11.2* 10.8*  11.5* 12.0 11.8*  HCT 34.0* 33.7* 37.2 38.4 38.3  MCV 86.5 87.3 85.1 83.8 84.7  PLT 209 234 245 174 213   Lab Results  Component Value Date   TSH 1.454 04/10/2015   Lab Results  Component Value Date   HGBA1C 5.5 03/20/2017   Lab Results  Component Value Date   CHOL 234 (H) 09/22/2017   HDL 122 09/22/2017   LDLCALC 99 09/22/2017   TRIG 65 09/22/2017   CHOLHDL 1.9 09/22/2017    Significant Diagnostic Results in last 30 days:  No results found.  Assessment/Plan  #1-edema-this actually appears to be baseline weight appears to be relatively stable she has had gain of about 3 pounds over the past 3 weeks but not a precipitous gain and actually still about 20 pounds below what she was in [place] appears to be relatively baseline-patient has expressed agreement with this.  She does state when it is cold in the room she thinks her legs start swelling up and she also reports her tongue swelling-this will have to be monitored but nursing has not really noted this.  We will continue to monitor.  2.-  Hypertension she continues to have variable blood pressure readings but not consistent elevated highs or lows will continue current medications and monitor as noted above.  3.  History of decreased right eye vision apparently this has been quite gradual ongoing for a period of time I suspect she would benefit from optometry or ophthalmology consult will discuss this with nursing.  [id number]

## 2017-11-14 ENCOUNTER — Other Ambulatory Visit: Payer: Self-pay

## 2017-11-14 MED ORDER — LORAZEPAM 0.5 MG PO TABS: 0.5000 mg | ORAL_TABLET | Freq: Two times a day (BID) | ORAL | 0 refills | Status: DC

## 2017-11-14 NOTE — Telephone Encounter (Signed)
RX Fax for [facility]@ [phone]  

## 2017-11-20 ENCOUNTER — Encounter: Payer: Self-pay | Admitting: Internal Medicine

## 2017-11-20 ENCOUNTER — Non-Acute Institutional Stay (SKILLED_NURSING_FACILITY): Payer: Medicare Other | Admitting: Internal Medicine

## 2017-11-20 DIAGNOSIS — K219 Gastro-esophageal reflux disease without esophagitis: Secondary | ICD-10-CM | POA: Diagnosis not present

## 2017-11-20 DIAGNOSIS — I10 Essential (primary) hypertension: Secondary | ICD-10-CM | POA: Diagnosis not present

## 2017-11-20 DIAGNOSIS — R0989 Other specified symptoms and signs involving the circulatory and respiratory systems: Secondary | ICD-10-CM

## 2017-11-20 DIAGNOSIS — Z86718 Personal history of other venous thrombosis and embolism: Secondary | ICD-10-CM

## 2017-11-20 NOTE — Progress Notes (Signed)
[facility] Room Number: 122/D Place of Service:  SNF (31) Provider:  Mickel Duhamel, PA-C  Patient Care Team: [name] as PCP - General (Internal Medicine) [name], MD as Consulting Physician (Geriatric Medicine)  Extended Emergency Contact Information Primary Emergency Contact: [name], [place] [address] [name] of [place] Phone: [phone] Relation: Daughter Secondary Emergency Contact: [name] States of [place] Phone: [phone] Relation: Son   Goals of care: Advanced Directive information Advanced Directives 11/20/2017  Does Patient Have a Medical Advance Directive? Yes  Type of Advance Directive Out of facility DNR (pink MOST or yellow form)  Does patient want to make changes to medical advance directive? No - Patient declined  Copy of [place] in Chart? No - copy requested  Pre-existing out of facility DNR order (yellow form or pink MOST form) -    Chief complaint-acute visit secondary to increased GERD-like symptoms--follow-up edema   HPI:  Pt is a 84 y.o. female seen today for an acute visit for possible increasing GERD-like symptoms.  Apparently patient earlier today had a "coughing up" episode where she coughed up some clearish phlegm- patient describes this more as a vomiting episode-.  Nursing staff also felt they noticed some increased chest congestion.  Currently her vital signs are stable her blood pressure is elevated but she does have elevated blood pressures at times especially in the morning and with anxiety-systolic I within the 093G- this usually comes down with her medications which are extensive including hydralazine 3 times a day hydrochlorothiazide 12.5 mg a day as well as Norvasc 5 mg a day --times she will have systolics in the low 182.'X  She does have a history of GERD per chart review she has been seen by GI numerous times in  the past reviewed consult notes dating back to 2012-she was thought to have a noncritical ring at the GI junction was dilated with balloon.  She also had a barium swallow done which showed age-related esophageal dysmotility.  Per GI she was put on Protonix.  Time she would complain of GERD-like symptoms but this was not persistent.  Earlier this month she was switched from Protonix to Zantac she is not really complaining of any nausea or significant abdominal discomfort.  She was seen recently for leg swelling she is not complaining really of that today-she attributed her leg swelling which are in the room.  Weight appears to be stable at around 170 pounds she had lost a significant amount of weight which she desired- has gained a few pounds since then--  She does have a history in 2016  minimal nonocclusive DVT in the distal left femoral vein--of questionable chronicity-she did complete a course of Eliquis-there was an attempt to transition to aspirin but she has refused aspirin  Not really complain of any increased shortness of breath or cough beyond baseline today.  Again her main complaint today is some suspected regurgitation with breakfast      Past Medical History:  Diagnosis Date  . Anxiety   . Cellulitis 2016  . CHF (congestive heart failure) ([place])   . Chronic kidney disease   . Dementia   . Dysphagia   . Gait abnormality   . GERD (gastroesophageal reflux disease)   . Glaucoma   . Gout   . Hyperlipidemia   . Hypertension   . Muscle weakness   .  Myocardial infarction [facility] [place]  . Osteoarthritis   . Syncope    Past Surgical History:  Procedure Laterality Date  . APPENDECTOMY    . CATARACT EXTRACTION W/PHACO  08/04/2011   Procedure: CATARACT EXTRACTION PHACO AND INTRAOCULAR LENS PLACEMENT (IOC);  Surgeon: Tonny Branch;  Location: [facility];  Service: Ophthalmology;  Laterality: Right;  CDE=18.53  . CATARACT EXTRACTION W/PHACO  08/25/2011   Procedure:  CATARACT EXTRACTION PHACO AND INTRAOCULAR LENS PLACEMENT (IOC);  Surgeon: Tonny Branch;  Location: [facility];  Service: Ophthalmology;  Laterality: Left;  CDE:14.76  . TONSILLECTOMY      Allergies  Allergen Reactions  . Bee Venom Anaphylaxis  . Angiotensin Receptor Blockers     Tongue swelling.  . Aspirin     Directed by physician  . Cabbage Other (See Comments)    Certain foods due to gout flares  . Latex Rash    Outpatient Encounter Medications as of 11/20/2017  Medication Sig  . acetaminophen (TYLENOL) 500 MG tablet Take 1,000 mg by mouth 2 (two) times daily. For leg pain and arthritis. May have additional 1000 mg for break through pain  . amLODipine (NORVASC) 5 MG tablet Take 5 mg by mouth daily.  . Brinzolamide-Brimonidine (SIMBRINZA) 1-0.2 % SUSP Apply to eye. One  drop into both eyes three times a day  . Calcium Carbonate-Vitamin D (CALCIUM-VITAMIN D) 500-200 MG-UNIT tablet Take 1 tablet by mouth 2 (two) times daily.  . citalopram (CELEXA) 20 MG tablet Take 20 mg by mouth daily.  . cycloSPORINE (RESTASIS) 0.05 % ophthalmic emulsion Place 1 drop into both eyes 2 (two) times daily.  Marland Kitchen docusate sodium (COLACE) 100 MG capsule Take 100 mg by mouth 2 (two) times daily.  Marland Kitchen donepezil (ARICEPT) 10 MG tablet Take 5 mg by mouth daily. For dementia  . hydrALAZINE (APRESOLINE) 25 MG tablet Take 25 mg by mouth 2 (two) times daily.   . hydrALAZINE (APRESOLINE) 25 MG tablet Take 1 1/2 tab to= 37.5 mg by mouth at bedtime  . hydrochlorothiazide (HYDRODIURIL) 12.5 MG tablet Take 12.5 mg by mouth daily.  Marland Kitchen ipratropium-albuterol (DUONEB) 0.5-2.5 (3) MG/3ML SOLN Take 3 mLs by nebulization every 6 (six) hours as needed.  . loratadine (CLARITIN) 10 MG tablet Take 10 mg by mouth daily.  Marland Kitchen LORazepam (ATIVAN) 0.5 MG tablet Take 1 tablet (0.5 mg total) by mouth 2 (two) times daily.  Marland Kitchen lovastatin (MEVACOR) 40 MG tablet Take 40 mg by mouth at bedtime.  . nitroGLYCERIN (NITROSTAT) 0.4 MG SL tablet Place 0.4 mg  under the tongue every 5 (five) minutes as needed for chest pain.   . potassium chloride (K-DUR,KLOR-CON) 10 MEQ tablet Take 10 mEq by mouth daily.  . ranitidine (ZANTAC) 150 MG tablet Take 150 mg by mouth daily.  . Travoprost, BAK Free, (TRAVATAN) 0.004 % SOLN ophthalmic solution Place 1 drop into both eyes at bedtime. For glaucoma   No facility-administered encounter medications on file as of 11/20/2017.     Review of Systems   General she is not complaining of any fever chills weight appears to be relatively stabilized.  Skin does not complain of rashes or itching has venous stasis changes lower extremities which are chronic.  Head ears eyes nose mouth and throat is not complaining of sore throat does complain apparently of some regurgitation describes as vomiting.  Respiratory is not complaining really of shortness of breath or cough earlier nursing staff thought she had some increased chest congestion.  Cardiac is  not complaining of chest pain or palpitations does not really complain of leg edema today has in the past.  GI is not complaining of abdominal discomfort or nausea questionable vomiting-regurgitation- as noted above.  GU does not complain of dysuria.  Muscle skeletall at times will have somewhat diffuse joint complaints but is not really complaining of that today at times complains of leg pain.  Neurologic does not complain of dizziness headache or numbness.  Psych does have some history of depression and anxiety this appears to be intermittent however in regards to anxiety depression appears to be well controlled  Immunization History  Administered Date(s) Administered  . Influenza Whole 06/11/2007, 10/16/2008, 05/21/2009, 05/05/2010  . Influenza-Unspecified 06/05/2014, 06/02/2016, 06/02/2017, 06/02/2017  . Pneumococcal Conjugate-13 06/06/2016  . Pneumococcal Polysaccharide-23 06/11/2007, 06/01/2016  . Pneumococcal-Unspecified 06/06/2016  . Td 06/11/2007  . Tdap  06/24/2017  . Zoster 05/21/2007   Pertinent  Health Maintenance Due  Topic Date Due  . PNA vac Low Risk Adult (2 of 2 - PCV13) 06/06/2025 (Originally 06/06/2017)  . INFLUENZA VACCINE  Completed  . DEXA SCAN  Completed   Fall Risk  05/22/2017  Falls in the past year? No   Functional Status Survey:    Vitals:   11/20/17 1318  BP: (!) 174/74  Pulse: 60  Resp: 18  Temp: 97.9 F (36.6 C)  TempSrc: Oral  SpO2: 97%    Weight is 171 pounds  Physical Exam   In general this is a pleasant elderly female in no distress sitting comfortably in a wheelchair.  Her skin is warm and dry she does have her chronic venous stasis changes of her lower extremities bilaterally-do not really see any increased erythema.  Eyes visual acuity appears grossly intact sclera and conjunctive are clear she does complain at times of some blurriness of her right eye apparently has been going on for an extended period of time.  Oropharynx is clear mucous membranes moist tongue is midline.  Chest-she has a very minimal amount of expiratory wheezing upper lung fields this is fairly baseline with previous exams-otherwise clear to auscultation there is no labored breathing.  Heart is regular rate and rhythm without murmur gallop or rub she has chronic edema venous stasis changes I do not see this increased recent exams.  Pedal pulses are reduced secondary to the edema.  Abdomen is soft nontender obese with positive bowel sounds.  Musculoskeletal moves all her extremities x4 at baseline with lower extremity weakness which is baseline she is ambulatory in a wheelchair.  Neurologic is grossly intact her speech is clear cranial nerves appear to be intact no lateralizing findings.  Psych she continues to be pleasant and appropriate a little anxious at times but not acutely anxious today  Her abdomen is soft obese nontender with positive bowel sounds    Labs reviewed: Recent Labs    09/25/17 0430  10/06/17 0743 11/03/17 0700  NA 141 140 139  K 3.5 3.8 3.7  CL 102 103 101  CO2 29 27 27   GLUCOSE 77 92 84  BUN 37* 41* 35*  CREATININE 1.18* 1.48* 1.31*  CALCIUM 9.8 9.6 9.7   Recent Labs    10/06/17 0743  AST 20  ALT 12*  ALKPHOS 74  BILITOT 0.5  PROT 7.6  ALBUMIN 3.9   Recent Labs    05/19/17 0800 06/05/17 0700 08/15/17 0730 10/06/17 0743 11/03/17 0700  WBC 5.8 4.8 4.2 3.9* 4.3  NEUTROABS 3.2 2.7  --  2.0  --  HGB 11.2* 10.8* 11.5* 12.0 11.8*  HCT 34.0* 33.7* 37.2 38.4 38.3  MCV 86.5 87.3 85.1 83.8 84.7  PLT 209 234 245 174 213   Lab Results  Component Value Date   TSH 1.454 04/10/2015   Lab Results  Component Value Date   HGBA1C 5.5 03/20/2017   Lab Results  Component Value Date   CHOL 234 (H) 09/22/2017   HDL 122 09/22/2017   LDLCALC 99 09/22/2017   TRIG 65 09/22/2017   CHOLHDL 1.9 09/22/2017    Significant Diagnostic Results in last 30 days:  No results found.  Assessment/Plan  #1 regurgitation-at this point suspicion would be that this is more GERD related- she does have a fairly extensive history here per chart review.  She has been recently switched to Zantac-will restart the Protonix 40 mg daily and monitor-if symptoms do not improve consider repeat GI consult.  I discussed this with [place] and she is in agreement  2.  Increased chest congestion-fairly baseline but will rule out aspiration and obtain a chest x-ray 2 views-monitor with vital signs pulse ox 24 hours every shift.  3.-As noted above she does have a somewhat distant history of a small nonocclusive DVT left leg--she is not really complaining of increased edema today but at times will complain of leg swelling-with her history here and refusal to take aspirin will update a Doppler to rule out any recurrence of DVT-3\  #4 hypertension-again she has quite variable systolics have been hesitant to be more aggressive with her blood pressure medications secondary to some lower  readings especially later in the day at this point will monitor I do not see consistent elevations continue current medications including hydralazine 3 times a day Norvasc 5 mg a day and hydrochlorothiazide 12.5 mg--she is on low-dose potassium will update a metabolic panel to ensure stability of electrolytes  Assessment and plan was discussed with Dr. Lyndel Safe  [phone] note greater than 35 minutes spent assessing patient reviewing her chart and labs and consult notes- discussing her status with nursing staff as well as with Dr. Lyndel Safe and coordinating and formulating a plan.  For numerous diagnoses of note greater than 50% of time spent coordinating plan of care        [place], [place] [phone]

## 2017-11-21 DIAGNOSIS — R600 Localized edema: Secondary | ICD-10-CM | POA: Diagnosis not present

## 2017-12-04 ENCOUNTER — Encounter: Payer: Self-pay | Admitting: Internal Medicine

## 2017-12-04 ENCOUNTER — Non-Acute Institutional Stay (SKILLED_NURSING_FACILITY): Payer: Medicare Other | Admitting: Internal Medicine

## 2017-12-04 DIAGNOSIS — I5032 Chronic diastolic (congestive) heart failure: Secondary | ICD-10-CM

## 2017-12-04 DIAGNOSIS — K219 Gastro-esophageal reflux disease without esophagitis: Secondary | ICD-10-CM | POA: Diagnosis not present

## 2017-12-04 DIAGNOSIS — N183 Chronic kidney disease, stage 3 unspecified: Secondary | ICD-10-CM

## 2017-12-04 DIAGNOSIS — F411 Generalized anxiety disorder: Secondary | ICD-10-CM

## 2017-12-04 DIAGNOSIS — I10 Essential (primary) hypertension: Secondary | ICD-10-CM

## 2017-12-04 DIAGNOSIS — F0391 Unspecified dementia with behavioral disturbance: Secondary | ICD-10-CM | POA: Diagnosis not present

## 2017-12-04 NOTE — Progress Notes (Signed)
Location:   [facility] Room Number: 122/D Place of Service:  SNF (31) Provider:  Mickel Duhamel, PA-C  Patient Care Team: [name] as PCP - General (Internal Medicine) [name], MD as Consulting Physician (Geriatric Medicine)  Extended Emergency Contact Information Primary Emergency Contact: [name], [place] [address] [name] of [place] Phone: [phone] Relation: Daughter Secondary Emergency Contact: [name] States of [place] Phone: [phone] Relation: Son  Code Status:  DNR Goals of care: Advanced Directive information Advanced Directives 12/04/2017  Does Patient Have a Medical Advance Directive? Yes  Type of Advance Directive Out of facility DNR (pink MOST or yellow form)  Does patient want to make changes to medical advance directive? No - Patient declined  Copy of [place] in Chart? No - copy requested  Pre-existing out of facility DNR order (yellow form or pink MOST form) -     Chief Complaint  Patient presents with  . Medical Management of Chronic Issues    Routine Visit  For medical management of chronic medical conditions including GERD-hypertension-venous stasis edema-diastolic CHF-history of distal DVT in the past.  As well as dementia-anxiety- depression and hyperlipidemia HPI:  Pt is a 84 y.o. female seen today for medical management of chronic diseases.  As noted above.  Currently she has no complaints today-iwe saw her  recently for complaints of possible increased GERD symptoms with regurgitation and we did put her back on her Protonix twice daily and this appears to have helped-   Her blood pressure at times also has been challenging with somewhat higher readings earlier than day and then lower readings towards night-I got a manual reading of 150/72 today.  I see listed readings ranging from 130Q-657Q systolically.  She is on  numerous medications including hydralazine 25 mg twice daily and 37.5 mg at night as well as hydrochlorothiazide 12.5 mg a day and Norvasc 5 mg a day.  She also has a history of venous stasis edema but this appears to be stabilized-she is on the low-dose hydrochlorothiazide she has been resistant to using Lasix.  She also has a history of a distal left femoral DVT she is completed a course of Eliquis--we did attempt to put her on aspirin subsequently but she has resisted this saying she is allergic to aspirin although unclear exactly what the allergy is.  She also at one point had hypoxia-we ordered a CT scan secondary to mildly elevated d-dimer that was negative for any pulmonary embolism- she is now basically off oxygen and saturating well-she was treated with a short course of antibiotics for possible pneumonia at that time.  Although x-ray was somewhat unclear in that regards.  Regards to her edema again she is on low-dose hydrochlorothiazide-she has a history of diastolic CHF which has been quite stable-she is actually lost weight which appears to have stabilized recently at around 166-170 pounds--she has desired the weight loss.  At times she does complain of diffuse osteoarthritic pain which appears to be stable at this point she is on routine Tylenol twice a day as well as a as needed dose.  Regards hyperlipidemia she is on a statin LDL was 99 on lab done in late [place] has a history of high HDL- fact it was 122 on lab done in January  Again today she actually has no acute complaints I do note she appears to  have a small amount of film on her tongue but is not complaining of any sore throat at this time or sore mouth   Past Medical History:  Diagnosis Date  . Anxiety   . Cellulitis 2016  . CHF (congestive heart failure) ([place])   . Chronic kidney disease   . Dementia   . Dysphagia   . Gait abnormality   . GERD (gastroesophageal reflux disease)   . Glaucoma   . Gout   .  Hyperlipidemia   . Hypertension   . Muscle weakness   . Myocardial infarction [facility]) [place]  . Osteoarthritis   . Syncope    Past Surgical History:  Procedure Laterality Date  . APPENDECTOMY    . CATARACT EXTRACTION W/PHACO  08/04/2011   Procedure: CATARACT EXTRACTION PHACO AND INTRAOCULAR LENS PLACEMENT (IOC);  Surgeon: Tonny Branch;  Location: [facility] OR[facility];  Service: Ophthalmology;  Laterality: Right;  CDE=18.53  . CATARACT EXTRACTION W/PHACO  08/25/2011   Procedure: CATARACT EXTRACTION PHACO AND INTRAOCULAR LENS PLACEMENT (IOC);  Surgeon: Tonny Branch;  Location: [facility] ORS;  Service: Ophthalmology;  Laterality: Left;  CDE:14.76  . TONSILLECTOMY      Allergies  Allergen Reactions  . Bee Venom Anaphylaxis  . Angiotensin Receptor Blockers     Tongue swelling.  . Aspirin     Directed by physician  . Cabbage Other (See Comments)    Certain foods due to gout flares  . Latex Rash    Outpatient Encounter Medications as of 12/04/2017  Medication Sig  . acetaminophen (TYLENOL) 500 MG tablet Take 1,000 mg by mouth 2 (two) times daily. For leg pain and arthritis. May have additional 1000 mg for break through pain  . amLODipine (NORVASC) 5 MG tablet Take 5 mg by mouth daily.  . Brinzolamide-Brimonidine (SIMBRINZA) 1-0.2 % SUSP Apply to eye. One  drop into both eyes three times a day  . Calcium Carbonate-Vitamin D (CALCIUM-VITAMIN D) 500-200 MG-UNIT tablet Take 1 tablet by mouth 2 (two) times daily.  . citalopram (CELEXA) 20 MG tablet Take 20 mg by mouth daily.  . cycloSPORINE (RESTASIS) 0.05 % ophthalmic emulsion Place 1 drop into both eyes 2 (two) times daily.  Marland Kitchen docusate sodium (COLACE) 100 MG capsule Take 100 mg by mouth 2 (two) times daily.  Marland Kitchen donepezil (ARICEPT) 10 MG tablet Take 5 mg by mouth daily. For dementia  . hydrALAZINE (APRESOLINE) 25 MG tablet Take 25 mg by mouth 2 (two) times daily.   . hydrALAZINE (APRESOLINE) 25 MG tablet Take 1 1/2 tab to= 37.5 mg by mouth at bedtime  .  hydrochlorothiazide (HYDRODIURIL) 12.5 MG tablet Take 12.5 mg by mouth daily.  Marland Kitchen ipratropium-albuterol (DUONEB) 0.5-2.5 (3) MG/3ML SOLN Take 3 mLs by nebulization every 6 (six) hours as needed.  . loratadine (CLARITIN) 10 MG tablet Take 10 mg by mouth daily.  Marland Kitchen LORazepam (ATIVAN) 0.5 MG tablet Take 1 tablet (0.5 mg total) by mouth 2 (two) times daily.  Marland Kitchen lovastatin (MEVACOR) 40 MG tablet Take 40 mg by mouth at bedtime.  . nitroGLYCERIN (NITROSTAT) 0.4 MG SL tablet Place 0.4 mg under the tongue every 5 (five) minutes as needed for chest pain.   . pantoprazole (PROTONIX) 40 MG tablet Take 40 mg by mouth 2 (two) times daily.  . potassium chloride (K-DUR,KLOR-CON) 10 MEQ tablet Take 10 mEq by mouth daily.  . Travoprost, BAK Free, (TRAVATAN) 0.004 % SOLN ophthalmic solution Place 1 drop into both eyes at bedtime. For glaucoma  . [  DISCONTINUED] ranitidine (ZANTAC) 150 MG tablet Take 150 mg by mouth daily.   No facility-administered encounter medications on file as of 12/04/2017.     Review of Systems   In general she is not complaining of fever chills weight appears to be stabilized recently at about 165-170 pounds  Skin is not complain of rashes or itching does have venous stasis changes lower extremities.  Head ears eyes nose mouth and throat is not complain of   Acute  visual changes or sore throat she says she does have some decreased vision baseline and does have a history of glaucoma  Respiratory is not complaining of shortness of breath or cough today.  Cardiac does not complain of chest pain has chronic lower extremity edema.  GU does not complain of dysuria.  Musculoskeletal at times has somewhat diffuse joint complaints especially of her legs but is not complaining of pain today.  Neurologic is not complaining of dizziness headache or syncope of numbness.  Psych does have a history of anxiety with depression but this appears at this point relatively well controlled with routine  Ativan twice daily is also on Celexa  Immunization History  Administered Date(s) Administered  . Influenza Whole 06/11/2007, 10/16/2008, 05/21/2009, 05/05/2010  . Influenza-Unspecified 06/05/2014, 06/02/2016, 06/02/2017, 06/02/2017  . Pneumococcal Conjugate-13 06/06/2016  . Pneumococcal Polysaccharide-23 06/11/2007, 06/01/2016  . Pneumococcal-Unspecified 06/06/2016  . Td 06/11/2007  . Tdap 06/24/2017  . Zoster 05/21/2007   Pertinent  Health Maintenance Due  Topic Date Due  . PNA vac Low Risk Adult (2 of 2 - PCV13) 01/03/2018 (Originally 06/06/2017)  . INFLUENZA VACCINE  03/29/2018  . DEXA SCAN  Completed   Fall Risk  05/22/2017  Falls in the past year? No   Functional Status Survey:    Vitals:   12/04/17 1450  BP: (!) 155/59  Pulse: 60  Resp: 16  Temp: 98 F (36.7 C)  TempSrc: Oral  SpO2: 96%  Weight: 166 lb 12.8 oz (75.7 kg)  Height: 4\' 11"  (1.499 m)  Of note manual reading was 150/72 Body mass index is 33.69 kg/m. Physical Exam    in general this is a pleasant elderly female in no distress sitting comfortably in her wheelchair.  Her skin is warm and dry she has chronic venous stasis changes of her lower extremities-this appears baseline   Eyes visual acuity appears to be grossly intact sclera and conjunctive are clear pupils appear reactive to light.  Oropharynx she does have a brownish film on her tongue mucous membranes are moist tongue is midline with full range of motion.  Chest is clear to auscultation with somewhat shallow air entry there is no labored breathing.  Heart is regular rate and rhythm without murmur gallop or rub she has chronic lower extremity venous stasis changes edema  -abdomen is soft nontender somewhat obese with positive bowel sounds.  Musculoskeletal moves all extremities at baseline strength appears to be intact and baseline largely ambulates in a wheelchair.  Neurologic is grossly intact without lateralizing findings her speech is  clear.  Psych she is pleasant and appropriate--does not appear anxious today Labs reviewed: Recent Labs    09/25/17 0430 10/06/17 0743 11/03/17 0700  NA 141 140 139  K 3.5 3.8 3.7  CL 102 103 101  CO2 29 27 27   GLUCOSE 77 92 84  BUN 37* 41* 35*  CREATININE 1.18* 1.48* 1.31*  CALCIUM 9.8 9.6 9.7   Recent Labs    10/06/17 0743  AST 20  ALT  12*  ALKPHOS 74  BILITOT 0.5  PROT 7.6  ALBUMIN 3.9   Recent Labs    05/19/17 0800 06/05/17 0700 08/15/17 0730 10/06/17 0743 11/03/17 0700  WBC 5.8 4.8 4.2 3.9* 4.3  NEUTROABS 3.2 2.7  --  2.0  --   HGB 11.2* 10.8* 11.5* 12.0 11.8*  HCT 34.0* 33.7* 37.2 38.4 38.3  MCV 86.5 87.3 85.1 83.8 84.7  PLT 209 234 245 174 213   Lab Results  Component Value Date   TSH 1.454 04/10/2015   Lab Results  Component Value Date   HGBA1C 5.5 03/20/2017   Lab Results  Component Value Date   CHOL 234 (H) 09/22/2017   HDL 122 09/22/2017   LDLCALC 99 09/22/2017   TRIG 65 09/22/2017   CHOLHDL 1.9 09/22/2017    Significant Diagnostic Results in last 30 days:  No results found.  Assessment/Plan  #1-hypertension this continues to be challenging she is on hydralazine as well as hydrochlorothiazide and Norvasc- somewhat hesitant to be real aggressive here with lower blood pressures at times will write an order to document blood pressures 3 times a day if consistently elevated will need to make adjustments.  2.-  History of venous stasis edema with history of diastolic CHF this appears stable she is on low-dose hydrochlorothiazide-.  3.  Weight loss this appears to have stabilized recently with rates in the 165-170 area-she has desired the weight loss.  4.  History of chronic kidney disease creatinine most recently last month was 1.3 which is relatively baseline will update this especially since she is on the hydrochlorothiazide.  5.  History of film on her tongue-will treat with nystatin swish and spit and monitor for resolution.  6.   History of a distal left femoral DVT-she did have updated Dopplers done which did not show evidence of a DVT which is encouraging she has refused aspirin after completing a course of Eliquis.  7.  History of GERD again we did address this recently by switching her back to Protonix which appears to have helped at this point will monitor if recurs consider a GI consult.--At one point she was seen by GI    frequently-and thought to have a noncritical ring at the GI junction that was dilated with balloon also had a barium swallow which showed age-related esophageal dysmotility and at that point apparently she was put on Protonix.  8.  History of dementia this appears stable on Aricept --- weight loss she has has been desired  #9 anxiety this appears fairly well controlled with the routine Ativan twice daily there is been no reports of any oversedation or respiratory depression.  10.  History of depression this appears stable on Celexa again this is complicated sometimes with her history of anxiety.  11.-History of hyperlipidemia as noted above this appears stable on lovastatin LDL was 99 on January lab HDL continues to be quite high most recently 122.  12.  History of hypoxia in the past this appears to have resolved again CT scan was reassuring with no evidence of pulmonary embolism she was treated for possible pneumonia.  She continues on duo nebs as needed .  13.  History of glaucoma-will write an order for follow-up with ophthalmology she is on topical drops  #14 history of osteoarthritic pain-this appears controlled on the routine Tylenol as well as a as needed dose.  [id number] note greater than 35 minutes spent assessing patient-reviewing her chart labs-discussing her status with nursing staff-coordinating and formulating plan  of care for numerous diagnoses-of note greater than 50% of time spent coordinating care

## 2017-12-05 ENCOUNTER — Encounter (HOSPITAL_COMMUNITY)
Admission: RE | Admit: 2017-12-05 | Discharge: 2017-12-05 | Disposition: A | Discharge status: Home / Self Care | Payer: Medicare Other | Source: Skilled Nursing Facility | Attending: Internal Medicine | Admitting: Internal Medicine

## 2017-12-05 DIAGNOSIS — I5032 Chronic diastolic (congestive) heart failure: Secondary | ICD-10-CM | POA: Insufficient documentation

## 2017-12-05 DIAGNOSIS — E785 Hyperlipidemia, unspecified: Secondary | ICD-10-CM | POA: Diagnosis not present

## 2017-12-05 DIAGNOSIS — N183 Chronic kidney disease, stage 3 (moderate): Secondary | ICD-10-CM | POA: Insufficient documentation

## 2017-12-05 DIAGNOSIS — I872 Venous insufficiency (chronic) (peripheral): Secondary | ICD-10-CM | POA: Insufficient documentation

## 2017-12-05 LAB — BASIC METABOLIC PANEL
Anion gap: 12 (ref 5–15)
BUN: 46 mg/dL — ABNORMAL HIGH (ref 6–20)
CHLORIDE: 104 mmol/L (ref 101–111)
CO2: 27 mmol/L (ref 22–32)
CREATININE: 1.38 mg/dL — AB (ref 0.44–1.00)
Calcium: 9.6 mg/dL (ref 8.9–10.3)
GFR calc non Af Amer: 32 mL/min — ABNORMAL LOW (ref >60)
GFR, EST AFRICAN AMERICAN: 37 mL/min — AB (ref >60)
Glucose, Bld: 97 mg/dL (ref 65–99)
Potassium: 3.7 mmol/L (ref 3.5–5.1)
SODIUM: 143 mmol/L (ref 135–145)

## 2017-12-11 ENCOUNTER — Other Ambulatory Visit: Payer: Self-pay

## 2017-12-11 MED ORDER — LORAZEPAM 0.5 MG PO TABS: 0.5000 mg | ORAL_TABLET | Freq: Two times a day (BID) | ORAL | 0 refills | Status: DC

## 2017-12-11 NOTE — Telephone Encounter (Signed)
RX Fax for [facility]@ [phone]  

## 2017-12-12 DIAGNOSIS — F320 Major depressive disorder, single episode, mild: Secondary | ICD-10-CM | POA: Diagnosis not present

## 2017-12-12 DIAGNOSIS — F411 Generalized anxiety disorder: Secondary | ICD-10-CM | POA: Diagnosis not present

## 2017-12-12 DIAGNOSIS — F428 Other obsessive-compulsive disorder: Secondary | ICD-10-CM | POA: Diagnosis not present

## 2017-12-15 DIAGNOSIS — F0390 Unspecified dementia without behavioral disturbance: Secondary | ICD-10-CM | POA: Diagnosis not present

## 2017-12-15 DIAGNOSIS — F411 Generalized anxiety disorder: Secondary | ICD-10-CM | POA: Diagnosis not present

## 2017-12-19 DIAGNOSIS — F411 Generalized anxiety disorder: Secondary | ICD-10-CM | POA: Diagnosis not present

## 2017-12-19 DIAGNOSIS — Z961 Presence of intraocular lens: Secondary | ICD-10-CM | POA: Diagnosis not present

## 2017-12-19 DIAGNOSIS — H524 Presbyopia: Secondary | ICD-10-CM | POA: Diagnosis not present

## 2017-12-19 DIAGNOSIS — F320 Major depressive disorder, single episode, mild: Secondary | ICD-10-CM | POA: Diagnosis not present

## 2017-12-19 DIAGNOSIS — F4310 Post-traumatic stress disorder, unspecified: Secondary | ICD-10-CM | POA: Diagnosis not present

## 2017-12-19 DIAGNOSIS — H04123 Dry eye syndrome of bilateral lacrimal glands: Secondary | ICD-10-CM | POA: Diagnosis not present

## 2017-12-19 DIAGNOSIS — H401134 Primary open-angle glaucoma, bilateral, indeterminate stage: Secondary | ICD-10-CM | POA: Diagnosis not present

## 2017-12-19 DIAGNOSIS — F428 Other obsessive-compulsive disorder: Secondary | ICD-10-CM | POA: Diagnosis not present

## 2017-12-21 DIAGNOSIS — I739 Peripheral vascular disease, unspecified: Secondary | ICD-10-CM | POA: Diagnosis not present

## 2017-12-21 DIAGNOSIS — B351 Tinea unguium: Secondary | ICD-10-CM | POA: Diagnosis not present

## 2017-12-21 DIAGNOSIS — M6281 Muscle weakness (generalized): Secondary | ICD-10-CM | POA: Diagnosis not present

## 2018-01-03 ENCOUNTER — Other Ambulatory Visit: Payer: Self-pay

## 2018-01-03 MED ORDER — LORAZEPAM 0.5 MG PO TABS: 0.5000 mg | ORAL_TABLET | Freq: Two times a day (BID) | ORAL | 0 refills | Status: DC

## 2018-01-03 NOTE — Telephone Encounter (Signed)
RX Fax for [facility]@ [phone]  

## 2018-01-23 ENCOUNTER — Encounter: Payer: Self-pay | Admitting: Internal Medicine

## 2018-01-23 ENCOUNTER — Non-Acute Institutional Stay (SKILLED_NURSING_FACILITY): Payer: Medicare Other | Admitting: Internal Medicine

## 2018-01-23 DIAGNOSIS — N183 Chronic kidney disease, stage 3 unspecified: Secondary | ICD-10-CM

## 2018-01-23 DIAGNOSIS — F0391 Unspecified dementia with behavioral disturbance: Secondary | ICD-10-CM

## 2018-01-23 DIAGNOSIS — F329 Major depressive disorder, single episode, unspecified: Secondary | ICD-10-CM

## 2018-01-23 DIAGNOSIS — K219 Gastro-esophageal reflux disease without esophagitis: Secondary | ICD-10-CM | POA: Diagnosis not present

## 2018-01-23 DIAGNOSIS — D649 Anemia, unspecified: Secondary | ICD-10-CM

## 2018-01-23 DIAGNOSIS — F32A Depression, unspecified: Secondary | ICD-10-CM

## 2018-01-23 DIAGNOSIS — I10 Essential (primary) hypertension: Secondary | ICD-10-CM | POA: Diagnosis not present

## 2018-01-23 DIAGNOSIS — F411 Generalized anxiety disorder: Secondary | ICD-10-CM

## 2018-01-23 DIAGNOSIS — I5032 Chronic diastolic (congestive) heart failure: Secondary | ICD-10-CM

## 2018-01-23 NOTE — Progress Notes (Signed)
Location:   [facility] Room Number: 122/D Place of Service:  SNF (31) Provider:  Tana Conch, PA-C  Patient Care Team: [name] as PCP - General (Internal Medicine) [name], MD as Consulting Physician (Geriatric Medicine)  Extended Emergency Contact Information Primary Emergency Contact: [name], [place] [address] [name] of [place] Phone: [phone] Relation: Daughter Secondary Emergency Contact: [name] States of [place] Phone: [phone] Relation: Son  Code Status:  DNR Goals of care: Advanced Directive information Advanced Directives 01/23/2018  Does Patient Have a Medical Advance Directive? Yes  Type of Advance Directive Out of facility DNR (pink MOST or yellow form)  Does patient want to make changes to medical advance directive? No - Patient declined  Copy of [place] in Chart? No - copy requested  Pre-existing out of facility DNR order (yellow form or pink MOST form) -     Chief Complaint  Patient presents with  . Medical Management of Chronic Issues    Patient being seen for Routine Vsit of Medical Managenent    HPI:  Pt is a 84 y.o. female seen today for medical management of chronic diseases.   Patient has H/o Hypertension, Distal DVT, Chronic venous stasis,  Diastolic CHF chronic Renal disaese and GERD,Glaucoma,Dementia and Anxiety  Patient is doing well in facility. Her BP has been running consistently high.  Her weight is now stable at 171 lbs. Her appetite is good. She does have some episodes of anxiety. No New Nursing issues   Past Medical History:  Diagnosis Date  . Anxiety   . Cellulitis 2016  . CHF (congestive heart failure) ([place])   . Chronic kidney disease   . Dementia   . Dysphagia   . Gait abnormality   . GERD (gastroesophageal reflux disease)   . Glaucoma   . Gout   . Hyperlipidemia   . Hypertension    . Muscle weakness   . Myocardial infarction [facility]) [place]  . Osteoarthritis   . Syncope    Past Surgical History:  Procedure Laterality Date  . APPENDECTOMY    . CATARACT EXTRACTION W/PHACO  08/04/2011   Procedure: CATARACT EXTRACTION PHACO AND INTRAOCULAR LENS PLACEMENT (IOC);  Surgeon: Tonny Branch;  Location: [facility];  Service: Ophthalmology;  Laterality: Right;  CDE=18.53  . CATARACT EXTRACTION W/PHACO  08/25/2011   Procedure: CATARACT EXTRACTION PHACO AND INTRAOCULAR LENS PLACEMENT (IOC);  Surgeon: Tonny Branch;  Location: [facility];  Service: Ophthalmology;  Laterality: Left;  CDE:14.76  . TONSILLECTOMY      Allergies  Allergen Reactions  . Bee Venom Anaphylaxis  . Angiotensin Receptor Blockers     Tongue swelling.  . Aspirin     Directed by physician  . Cabbage Other (See Comments)    Certain foods due to gout flares  . Latex Rash    Outpatient Encounter Medications as of 01/23/2018  Medication Sig  . acetaminophen (TYLENOL) 500 MG tablet Take 1,000 mg by mouth 2 (two) times daily. For leg pain and arthritis. May have additional 1000 mg for break through pain  . amLODipine (NORVASC) 5 MG tablet Take 5 mg by mouth daily.  . Brinzolamide-Brimonidine (SIMBRINZA) 1-0.2 % SUSP Apply to eye. One  drop into both eyes three times a day  . Calcium Carbonate-Vitamin D (CALCIUM-VITAMIN D) 500-200 MG-UNIT tablet Take 1 tablet by mouth 2 (two)  times daily.  . citalopram (CELEXA) 20 MG tablet Take 20 mg by mouth daily.  . cycloSPORINE (RESTASIS) 0.05 % ophthalmic emulsion Place 1 drop into both eyes 2 (two) times daily.  Marland Kitchen docusate sodium (COLACE) 100 MG capsule Take 100 mg by mouth 2 (two) times daily.  Marland Kitchen donepezil (ARICEPT) 10 MG tablet Take 5 mg by mouth daily. For dementia  . hydrALAZINE (APRESOLINE) 25 MG tablet Take 25 mg by mouth 2 (two) times daily.   . hydrALAZINE (APRESOLINE) 25 MG tablet Take 1 1/2 tab to= 37.5 mg by mouth at bedtime  . hydrochlorothiazide (HYDRODIURIL)  12.5 MG tablet Take 12.5 mg by mouth daily.  Marland Kitchen ipratropium-albuterol (DUONEB) 0.5-2.5 (3) MG/3ML SOLN Take 3 mLs by nebulization every 6 (six) hours as needed.  . loratadine (CLARITIN) 10 MG tablet Take 10 mg by mouth daily.  Marland Kitchen LORazepam (ATIVAN) 0.5 MG tablet Take 1 tablet (0.5 mg total) by mouth 2 (two) times daily.  Marland Kitchen lovastatin (MEVACOR) 40 MG tablet Take 40 mg by mouth at bedtime.  . nitroGLYCERIN (NITROSTAT) 0.4 MG SL tablet Place 0.4 mg under the tongue every 5 (five) minutes as needed for chest pain.   . pantoprazole (PROTONIX) 40 MG tablet Take 40 mg by mouth 2 (two) times daily.  . potassium chloride (K-DUR,KLOR-CON) 10 MEQ tablet Take 10 mEq by mouth daily.  . Travoprost, BAK Free, (TRAVATAN) 0.004 % SOLN ophthalmic solution Place 1 drop into both eyes at bedtime. For glaucoma   No facility-administered encounter medications on file as of 01/23/2018.      Review of Systems  Review of Systems  Constitutional: Negative for activity change, appetite change, chills, diaphoresis, fatigue and fever.  HENT: Negative for mouth sores, postnasal drip, rhinorrhea, sinus pain and sore throat.   Respiratory: Negative for apnea, cough, chest tightness, shortness of breath and wheezing.   Cardiovascular: Negative for chest pain, palpitations and leg swelling.  Gastrointestinal: Negative for abdominal distention, abdominal pain, constipation, diarrhea, nausea and vomiting.  Genitourinary: Negative for dysuria and frequency.  Musculoskeletal: Negative for arthralgias, joint swelling and myalgias.  Skin: Negative for rash.  Neurological: Negative for dizziness, syncope, weakness, light-headedness and numbness.  Psychiatric/Behavioral: Negative for behavioral problems, confusion and sleep disturbance.     Immunization History  Administered Date(s) Administered  . Influenza Whole 06/11/2007, 10/16/2008, 05/21/2009, 05/05/2010  . Influenza-Unspecified 06/05/2014, 06/02/2016, 06/02/2017,  06/02/2017  . Pneumococcal Conjugate-13 06/06/2016  . Pneumococcal Polysaccharide-23 06/11/2007, 06/01/2016  . Pneumococcal-Unspecified 06/06/2016  . Td 06/11/2007  . Tdap 06/24/2017  . Zoster 05/21/2007   Pertinent  Health Maintenance Due  Topic Date Due  . PNA vac Low Risk Adult (2 of 2 - PCV13) 06/06/2017  . INFLUENZA VACCINE  03/29/2018  . DEXA SCAN  Completed   Fall Risk  05/22/2017  Falls in the past year? No   Functional Status Survey:    Vitals:   01/23/18 1234  BP: (!) 154/70  Pulse: (!) 55  Resp: 18  SpO2: 96%  Weight: 172 lb 3.2 oz (78.1 kg)  Height: 4\' 11"  (1.499 m)   Body mass index is 34.78 kg/m. Physical Exam  Constitutional: She is oriented to person, place, and time. She appears well-developed and well-nourished.  HENT:  Head: Normocephalic.  Mouth/Throat: Oropharynx is clear and moist.  Eyes: Pupils are equal, round, and reactive to light.  Neck: Neck supple.  Cardiovascular: Normal rate and regular rhythm.  Pulmonary/Chest: Effort normal and breath sounds normal.  Abdominal: Soft. Bowel sounds are normal.  Musculoskeletal: She exhibits edema.  With Chronic Venous changes  Neurological: She is alert and oriented to person, place, and time.  Skin: Skin is warm and dry.  Psychiatric: She has a normal mood and affect. Her behavior is normal.    Labs reviewed: Recent Labs    10/06/17 0743 11/03/17 0700 12/05/17 0700  NA 140 139 143  K 3.8 3.7 3.7  CL 103 101 104  CO2 27 27 27   GLUCOSE 92 84 97  BUN 41* 35* 46*  CREATININE 1.48* 1.31* 1.38*  CALCIUM 9.6 9.7 9.6   Recent Labs    10/06/17 0743  AST 20  ALT 12*  ALKPHOS 74  BILITOT 0.5  PROT 7.6  ALBUMIN 3.9   Recent Labs    05/19/17 0800 06/05/17 0700 08/15/17 0730 10/06/17 0743 11/03/17 0700  WBC 5.8 4.8 4.2 3.9* 4.3  NEUTROABS 3.2 2.7  --  2.0  --   HGB 11.2* 10.8* 11.5* 12.0 11.8*  HCT 34.0* 33.7* 37.2 38.4 38.3  MCV 86.5 87.3 85.1 83.8 84.7  PLT 209 234 245 174 213    Lab Results  Component Value Date   TSH 1.454 04/10/2015   Lab Results  Component Value Date   HGBA1C 5.5 03/20/2017   Lab Results  Component Value Date   CHOL 234 (H) 09/22/2017   HDL 122 09/22/2017   LDLCALC 99 09/22/2017   TRIG 65 09/22/2017   CHOLHDL 1.9 09/22/2017    Significant Diagnostic Results in last 30 days:  No results found.  Assessment/Plan  Essential Hypertension She has very Labile BP with swings of SBP of 180-120 But recently it has been staying high On Hydralazine and HCTZ and Norvasc Will increase her Hydralazine to 37.5 Mg TID  Chronic diastolic congestive heart failure She does have some swelling in her legs. She continues to refuse Lasix Continue on HCTZ  Stage 3 chronic kidney disease Creatinine is staying stable.  Anxiety state patient is doing well She is on Celexa and Ativan.  Any titration can make her symptoms recur  Hyperlipidemia, LDL 99 01/19 on lovastatin GERD Change her Protonix to QD Dementia stable on Aricept       Family/ staff Communication:   Labs/tests ordered:   Total time spent in this patient care encounter was 25 minutes; greater than 50% of the visit spent counseling patient, reviewing records , Labs and coordinating care for problems addressed at this encounter.

## 2018-02-06 DIAGNOSIS — F428 Other obsessive-compulsive disorder: Secondary | ICD-10-CM | POA: Diagnosis not present

## 2018-02-06 DIAGNOSIS — F320 Major depressive disorder, single episode, mild: Secondary | ICD-10-CM | POA: Diagnosis not present

## 2018-02-06 DIAGNOSIS — F411 Generalized anxiety disorder: Secondary | ICD-10-CM | POA: Diagnosis not present

## 2018-02-06 DIAGNOSIS — F4310 Post-traumatic stress disorder, unspecified: Secondary | ICD-10-CM | POA: Diagnosis not present

## 2018-02-07 ENCOUNTER — Other Ambulatory Visit: Payer: Self-pay

## 2018-02-07 MED ORDER — LORAZEPAM 0.5 MG PO TABS: 0.5000 mg | ORAL_TABLET | Freq: Two times a day (BID) | ORAL | 0 refills | Status: DC

## 2018-02-07 NOTE — Telephone Encounter (Signed)
RX Fax for [facility]@ [phone]  

## 2018-02-13 DIAGNOSIS — F320 Major depressive disorder, single episode, mild: Secondary | ICD-10-CM | POA: Diagnosis not present

## 2018-02-13 DIAGNOSIS — F411 Generalized anxiety disorder: Secondary | ICD-10-CM | POA: Diagnosis not present

## 2018-02-13 DIAGNOSIS — F428 Other obsessive-compulsive disorder: Secondary | ICD-10-CM | POA: Diagnosis not present

## 2018-02-21 DIAGNOSIS — F428 Other obsessive-compulsive disorder: Secondary | ICD-10-CM | POA: Diagnosis not present

## 2018-02-21 DIAGNOSIS — F320 Major depressive disorder, single episode, mild: Secondary | ICD-10-CM | POA: Diagnosis not present

## 2018-02-21 DIAGNOSIS — F411 Generalized anxiety disorder: Secondary | ICD-10-CM | POA: Diagnosis not present

## 2018-02-27 DIAGNOSIS — F428 Other obsessive-compulsive disorder: Secondary | ICD-10-CM | POA: Diagnosis not present

## 2018-02-27 DIAGNOSIS — F320 Major depressive disorder, single episode, mild: Secondary | ICD-10-CM | POA: Diagnosis not present

## 2018-02-27 DIAGNOSIS — F411 Generalized anxiety disorder: Secondary | ICD-10-CM | POA: Diagnosis not present

## 2018-03-02 ENCOUNTER — Non-Acute Institutional Stay (SKILLED_NURSING_FACILITY): Payer: Medicare Other | Admitting: Internal Medicine

## 2018-03-02 ENCOUNTER — Encounter: Payer: Self-pay | Admitting: Internal Medicine

## 2018-03-02 DIAGNOSIS — K219 Gastro-esophageal reflux disease without esophagitis: Secondary | ICD-10-CM | POA: Diagnosis not present

## 2018-03-02 DIAGNOSIS — N183 Chronic kidney disease, stage 3 unspecified: Secondary | ICD-10-CM

## 2018-03-02 DIAGNOSIS — F0391 Unspecified dementia with behavioral disturbance: Secondary | ICD-10-CM | POA: Diagnosis not present

## 2018-03-02 DIAGNOSIS — R05 Cough: Secondary | ICD-10-CM

## 2018-03-02 DIAGNOSIS — I5032 Chronic diastolic (congestive) heart failure: Secondary | ICD-10-CM | POA: Diagnosis not present

## 2018-03-02 DIAGNOSIS — R059 Cough, unspecified: Secondary | ICD-10-CM

## 2018-03-02 DIAGNOSIS — I10 Essential (primary) hypertension: Secondary | ICD-10-CM | POA: Diagnosis not present

## 2018-03-02 NOTE — Progress Notes (Signed)
Location:   [facility] Room Number: 122/D Place of Service:  SNF [phone]) Provider:  Mickel Duhamel, PA-C  Patient Care Team: [name] as PCP - General (Internal Medicine) [name], MD as Consulting Physician (Geriatric Medicine)  Extended Emergency Contact Information Primary Emergency Contact: [name], [place] [address] [name] of [place] Phone: [phone] Relation: Daughter Secondary Emergency Contact: [name] [place] of [place] Phone: [phone] Relation: Son  Code Status:  DNR Goals of care: Advanced Directive information Advanced Directives 03/02/2018  Does Patient Have a Medical Advance Directive? Yes  Type of Advance Directive Out of facility DNR (pink MOST or yellow form)  Does patient want to make changes to medical advance directive? No - Patient declined  Copy of [place] in Chart? No - copy requested  Pre-existing out of facility DNR order (yellow form or pink MOST form) -     Chief Complaint  Patient presents with  . Medical Management of Chronic Issues    Patient is being seen for Routine Visit  . Acute Visit    Patients C/o Upper Respiratory Issues  Medical management of chronic medical issues including hypertension-diastolic CHF- chronic kidney disease- anxiety-GERD- as well as dementia  Acute visit secondary to coughing   HPI:  Pt is a 84 y.o. female seen today for medical management of chronic diseases as noted above in addition to coughing with possible concerns for a URI  She is a long-term resident of facility and has been stable recently with no recent acute issues- she does have a history of anxiety but this appears to be reasonably controlled on Ativan twice a day.  She also has variable blood pressure readings- reading today is 160/72-this may be somewhat elevated because of her respiratory issues and coughing-previous blood  pressures 131/64- 118/58-154/65 167/68 again there continues to be variability hydralazine was recently increased by Dr. Lyndel Safe she is also on Norvasc addition to hydralazine and hydrochlorothiazide  She has gained about 4 pounds since last month however she has had significant weight loss in the past-at this point will monitor but appears to be relatively stable at this regards.   does have a history of dementia on Aricept but this appears to be well controlled and mild he does very well with supportive care.  She also has a history of chronic kidney disease last creatinine was 1.38 in April will recheck this appears to be relatively stable.  She  does have a history of diastolic CHF but does not take Lasix she is on hydrochlorothiazide-   Most acute issue today is some increased cough she is not really complaining of shortness of breath- vital signs appear to be stable oxygen saturation is in the 90s on room air- several months ago she did develop some hypoxia CT scan showed possible pneumonia so she was treated with antibiotics although the etiology of the hypoxia iwasnot truly clear.  Nonetheless she has not been oxygen dependent now for some time.  She does have duo nebs as needed but is somewhat resistant to using these.  Vital signs are stable systolic is somewhat elevated which does occur at times    .   Marland Kitchen     Past Medical History:  Diagnosis Date  . Anxiety   . Cellulitis 2016  . CHF (congestive heart failure) ([place])   . Chronic kidney disease   .  Dementia   . Dysphagia   . Gait abnormality   . GERD (gastroesophageal reflux disease)   . Glaucoma   . Gout   . Hyperlipidemia   . Hypertension   . Muscle weakness   . Myocardial infarction [facility]) [place]  . Osteoarthritis   . Syncope    Past Surgical History:  Procedure Laterality Date  . APPENDECTOMY    . CATARACT EXTRACTION W/PHACO  08/04/2011   Procedure: CATARACT EXTRACTION PHACO AND INTRAOCULAR LENS  PLACEMENT (IOC);  Surgeon: Tonny Branch;  Location: [facility];  Service: Ophthalmology;  Laterality: Right;  CDE=18.53  . CATARACT EXTRACTION W/PHACO  08/25/2011   Procedure: CATARACT EXTRACTION PHACO AND INTRAOCULAR LENS PLACEMENT (IOC);  Surgeon: Tonny Branch;  Location: [facility];  Service: Ophthalmology;  Laterality: Left;  CDE:14.76  . TONSILLECTOMY      Allergies  Allergen Reactions  . Bee Venom Anaphylaxis  . Angiotensin Receptor Blockers     Tongue swelling.  . Aspirin     Directed by physician  . Cabbage Other (See Comments)    Certain foods due to gout flares  . Latex Rash     Outpatient Encounter Medications as of 03/02/2018  Medication Sig  . acetaminophen (TYLENOL) 500 MG tablet Take 1,000 mg by mouth 2 (two) times daily. For leg pain and arthritis. May have additional 1000 mg for break through pain  . amLODipine (NORVASC) 5 MG tablet Take 5 mg by mouth daily.  Roseanne Kaufman [place]-Castor Oil (VENELEX) OINT Apply to bilateral buttocks, sacrum and coccyx q shift and prn incontinence for protection and moisture related skin irritation  . Brinzolamide-Brimonidine (SIMBRINZA) 1-0.2 % SUSP Apply to eye. One  drop into both eyes three times a day  . Calcium Carbonate-Vitamin D (CALCIUM-VITAMIN D) 500-200 MG-UNIT tablet Take 1 tablet by mouth 2 (two) times daily.  . citalopram (CELEXA) 20 MG tablet Take 20 mg by mouth daily.  . cycloSPORINE (RESTASIS) 0.05 % ophthalmic emulsion Place 1 drop into both eyes 2 (two) times daily.  Marland Kitchen docusate sodium (COLACE) 100 MG capsule Take 100 mg by mouth 2 (two) times daily.  Marland Kitchen donepezil (ARICEPT) 10 MG tablet Take 5 mg by mouth daily. For dementia  . hydrALAZINE (APRESOLINE) 25 MG tablet Take 1 1/2 tab to= 37.5 mg by mouth at bedtime  . hydrochlorothiazide (HYDRODIURIL) 12.5 MG tablet Take 12.5 mg by mouth daily.  Marland Kitchen ipratropium-albuterol (DUONEB) 0.5-2.5 (3) MG/3ML SOLN Take 3 mLs by nebulization every 6 (six) hours as needed.  . loratadine (CLARITIN) 10 MG  tablet Take 10 mg by mouth daily.  Marland Kitchen LORazepam (ATIVAN) 0.5 MG tablet Take 1 tablet (0.5 mg total) by mouth 2 (two) times daily.  Marland Kitchen lovastatin (MEVACOR) 40 MG tablet Take 40 mg by mouth at bedtime.  . nitroGLYCERIN (NITROSTAT) 0.4 MG SL tablet Place 0.4 mg under the tongue every 5 (five) minutes as needed for chest pain.   . pantoprazole (PROTONIX) 40 MG tablet Take 20 mg by mouth 2 (two) times daily.   . potassium chloride (K-DUR,KLOR-CON) 10 MEQ tablet Take 10 mEq by mouth daily.  . Travoprost, BAK Free, (TRAVATAN) 0.004 % SOLN ophthalmic solution Place 1 drop into both eyes at bedtime. For glaucoma  . [DISCONTINUED] hydrALAZINE (APRESOLINE) 25 MG tablet Take 25 mg by mouth 2 (two) times daily.    No facility-administered encounter medications on file as of 03/02/2018.      Review of Systems   In general is not complaining of fever  chills does have a cough  Skin is not complain of rashes itching or diaphoresis.  Head ears eyes nose mouth and throat does not complain of visual changes or sore throat does appear to have some watery discharge from eyes.  Respiratory she is not complaining of shortness of breath- she is complaining of a productive cough although color of the production cannot be recalled by patient   Cardiac-is not complaining of chest pain has baseline venous stasis edema.  GI is not complaining of abdominal discomfort nausea vomiting diarrhea constipation at this time.  GU is not complaining of dysuria.  Musculoskeletal-is not currently complaining of joint pain- has complained of leg pain at times.  Neurologic is not complaining of dizziness headache syncope.  Psych is slightly anxious but does not complain of overt depression appears to be relatively at baseline    Immunization History  Administered Date(s) Administered  . Influenza Whole 06/11/2007, 10/16/2008, 05/21/2009, 05/05/2010  . Influenza-Unspecified 06/05/2014, 06/02/2016, 06/02/2017, 06/02/2017  .  Pneumococcal Conjugate-13 06/06/2016  . Pneumococcal Polysaccharide-23 06/11/2007, 06/01/2016  . Pneumococcal-Unspecified 06/06/2016  . Td 06/11/2007  . Tdap 06/24/2017  . Zoster 05/21/2007   Pertinent  Health Maintenance Due  Topic Date Due  . PNA vac Low Risk Adult (2 of 2 - PCV13) 04/02/2018 (Originally 06/06/2017)  . INFLUENZA VACCINE  03/29/2018  . DEXA SCAN  Completed   Fall Risk  05/22/2017  Falls in the past year? No   Functional Status Survey:  Temp--98.0   pulse is 72 respirations of 18 blood pressure 160/72 O2 saturation is 97% on room air  Vitals:   03/02/18 1623  Weight: 175 lb 6.4 oz (79.6 kg)  Height: 4\' 11"  (1.499 m)   Body mass index is 35.43 kg/m. Physical Exam   In general this is a pleasant elderly female in no distress her skin is warm and dry.  Eyes sclera and conjunctive are clear appears to have some slight watery discharge visual acuity appears to be intact.  Oropharynx is clear mucous membranes moist.  Chest there is no labored breathing she has some mild expiratory wheezing somewhat diffuse--  Heart is regular rate and rhythm without murmur gallop or rub she has her baseline venous stasis edema.  Abdomen is obese soft nontender with positive bowel sounds.  Musculoskeletal moves all extremities at baseline has baseline lower extremity weakness with her edema  Neurologic is grossly intact her speech is clear no lateralizing findings  Psych she is largely alert and oriented pleasant and appropriate slightly anxious  Labs reviewed: Recent Labs    10/06/17 0743 11/03/17 0700 12/05/17 0700  NA 140 139 143  K 3.8 3.7 3.7  CL 103 101 104  CO2 27 27 27   GLUCOSE 92 84 97  BUN 41* 35* 46*  CREATININE 1.48* 1.31* 1.38*  CALCIUM 9.6 9.7 9.6   Recent Labs    10/06/17 0743  AST 20  ALT 12*  ALKPHOS 74  BILITOT 0.5  PROT 7.6  ALBUMIN 3.9   Recent Labs    05/19/17 0800 06/05/17 0700 08/15/17 0730 10/06/17 0743 11/03/17 0700  WBC  5.8 4.8 4.2 3.9* 4.3  NEUTROABS 3.2 2.7  --  2.0  --   HGB 11.2* 10.8* 11.5* 12.0 11.8*  HCT 34.0* 33.7* 37.2 38.4 38.3  MCV 86.5 87.3 85.1 83.8 84.7  PLT 209 234 245 174 213   Lab Results  Component Value Date   TSH 1.454 04/10/2015   Lab Results  Component Value Date  HGBA1C 5.5 03/20/2017   Lab Results  Component Value Date   CHOL 234 (H) 09/22/2017   HDL 122 09/22/2017   LDLCALC 99 09/22/2017   TRIG 65 09/22/2017   CHOLHDL 1.9 09/22/2017    Significant Diagnostic Results in last 30 days:  No results found.  Assessment/Plan  #1- history of cough- we will start Mucinex 600 mg twice daily for 7 days- also will empirically start Avelox 400 mg daily x7 days since she does have a previous history of pneumonia and some chest congestion although no sign of distress  or   labored breathing --.  O2 saturations are satisfactory in the 90s  Will obtain a chest x-ray two-view-  PRN duo nebs need to be encouraged  I did speak with her about this--saying she would benefit from this.--  And also will start a probiotic since she is on an antibiotic-monitor vital signs pulse ox every shift for 72 hours.  Also will obtain a CBC with differential and metabolic panel tomorrow morning   #2-hypertension-this continues to be somewhat challenging with her variability since she is having cold cough symptoms this may be elevating her blood pressure currently but this will have to be watched at this point continue current medications including hydralazine which was recently increased to 37.5 mg 3 times a day-also on hydrochlorothiazide 12.5 mg a day Norvasc 5 mg a day  #3 history of diastolic CHF-again she has refused her Lasix she is on hydrochlorothiazide-at this point will monitor she has gained some weight but I suspect this is more appetite related- she has previously lost a significant amount of weight.  4.-History of anxiety this appears to be stabilized on her Ativan twice daily she is  also on Celexa-at this point would avoid a dose reduction she has not really tolerated this.  5.  History of GERD she is on Protonix daily she currently does not have any complaints in that regard-  At one point she was seen by GI and was thought to have a ring at the GI junction that was dilated with balloon also had a barium swallow which showed age-related esophageal dysmotility--it was at that point she was started on Protonix  #6 history of distal left femoral DVT- updated Dopplers did not show evidence of a DVT-she had refused her aspirin after completing a course of Eliquis  #7 history of dementia this appears to be mild she is on Aricept continues to do well report of care.  8.  History of hyperlipidemia she continues on a statin LDL was 99 in January robust HDL of 122  #9- history of chronic kidney disease again this shows some stability with last creatinine of 1.38 in April will update this.  10.  History constipation this appears stable she is on Colace.  71  [id number]-of note greater than 40 minutes spent assessing patient- discussing her status with nursing staff- reviewing her chart and labs- and coordinating and formulating a plan of care for numerous diagnoses- of note greater than 50% of time spent coordinating a plan of care

## 2018-03-03 ENCOUNTER — Encounter: Payer: Self-pay | Admitting: Internal Medicine

## 2018-03-03 ENCOUNTER — Encounter (HOSPITAL_COMMUNITY): Payer: Self-pay | Admitting: Emergency Medicine

## 2018-03-03 ENCOUNTER — Encounter (HOSPITAL_COMMUNITY)
Admission: RE | Admit: 2018-03-03 | Discharge: 2018-03-03 | Disposition: A | Discharge status: Home / Self Care | Payer: Medicare Other | Source: Skilled Nursing Facility | Attending: *Deleted | Admitting: *Deleted

## 2018-03-03 ENCOUNTER — Emergency Department (HOSPITAL_COMMUNITY): Payer: Medicare Other

## 2018-03-03 ENCOUNTER — Encounter (HOSPITAL_COMMUNITY): Payer: Self-pay

## 2018-03-03 ENCOUNTER — Other Ambulatory Visit: Payer: Self-pay

## 2018-03-03 ENCOUNTER — Inpatient Hospital Stay (HOSPITAL_COMMUNITY)
Admission: EM | Admit: 2018-03-03 | Discharge: 2018-03-07 | DRG: 202 | Disposition: A | Discharge status: Skilled Nursing Facility | Payer: Medicare Other | Attending: Internal Medicine | Admitting: Internal Medicine

## 2018-03-03 DIAGNOSIS — M79604 Pain in right leg: Secondary | ICD-10-CM

## 2018-03-03 DIAGNOSIS — Z91040 Latex allergy status: Secondary | ICD-10-CM | POA: Diagnosis not present

## 2018-03-03 DIAGNOSIS — R531 Weakness: Secondary | ICD-10-CM

## 2018-03-03 DIAGNOSIS — Z79899 Other long term (current) drug therapy: Secondary | ICD-10-CM | POA: Diagnosis not present

## 2018-03-03 DIAGNOSIS — M129 Arthropathy, unspecified: Secondary | ICD-10-CM | POA: Diagnosis not present

## 2018-03-03 DIAGNOSIS — R778 Other specified abnormalities of plasma proteins: Secondary | ICD-10-CM

## 2018-03-03 DIAGNOSIS — Z9981 Dependence on supplemental oxygen: Secondary | ICD-10-CM | POA: Diagnosis not present

## 2018-03-03 DIAGNOSIS — J9601 Acute respiratory failure with hypoxia: Secondary | ICD-10-CM | POA: Diagnosis not present

## 2018-03-03 DIAGNOSIS — B9789 Other viral agents as the cause of diseases classified elsewhere: Secondary | ICD-10-CM | POA: Diagnosis present

## 2018-03-03 DIAGNOSIS — I130 Hypertensive heart and chronic kidney disease with heart failure and stage 1 through stage 4 chronic kidney disease, or unspecified chronic kidney disease: Secondary | ICD-10-CM | POA: Diagnosis present

## 2018-03-03 DIAGNOSIS — N390 Urinary tract infection, site not specified: Secondary | ICD-10-CM | POA: Diagnosis not present

## 2018-03-03 DIAGNOSIS — I5032 Chronic diastolic (congestive) heart failure: Secondary | ICD-10-CM | POA: Diagnosis not present

## 2018-03-03 DIAGNOSIS — J40 Bronchitis, not specified as acute or chronic: Secondary | ICD-10-CM | POA: Diagnosis not present

## 2018-03-03 DIAGNOSIS — Z888 Allergy status to other drugs, medicaments and biological substances status: Secondary | ICD-10-CM | POA: Diagnosis not present

## 2018-03-03 DIAGNOSIS — N183 Chronic kidney disease, stage 3 unspecified: Secondary | ICD-10-CM

## 2018-03-03 DIAGNOSIS — E785 Hyperlipidemia, unspecified: Secondary | ICD-10-CM | POA: Diagnosis present

## 2018-03-03 DIAGNOSIS — Z886 Allergy status to analgesic agent status: Secondary | ICD-10-CM | POA: Diagnosis not present

## 2018-03-03 DIAGNOSIS — Z66 Do not resuscitate: Secondary | ICD-10-CM | POA: Diagnosis present

## 2018-03-03 DIAGNOSIS — R509 Fever, unspecified: Secondary | ICD-10-CM | POA: Diagnosis not present

## 2018-03-03 DIAGNOSIS — M6281 Muscle weakness (generalized): Secondary | ICD-10-CM | POA: Diagnosis not present

## 2018-03-03 DIAGNOSIS — J206 Acute bronchitis due to rhinovirus: Secondary | ICD-10-CM | POA: Diagnosis not present

## 2018-03-03 DIAGNOSIS — R262 Difficulty in walking, not elsewhere classified: Secondary | ICD-10-CM | POA: Diagnosis not present

## 2018-03-03 DIAGNOSIS — H409 Unspecified glaucoma: Secondary | ICD-10-CM | POA: Diagnosis present

## 2018-03-03 DIAGNOSIS — R0789 Other chest pain: Secondary | ICD-10-CM | POA: Diagnosis not present

## 2018-03-03 DIAGNOSIS — K219 Gastro-esophageal reflux disease without esophagitis: Secondary | ICD-10-CM | POA: Diagnosis present

## 2018-03-03 DIAGNOSIS — F0390 Unspecified dementia without behavioral disturbance: Secondary | ICD-10-CM | POA: Diagnosis present

## 2018-03-03 DIAGNOSIS — F329 Major depressive disorder, single episode, unspecified: Secondary | ICD-10-CM | POA: Diagnosis not present

## 2018-03-03 DIAGNOSIS — M79606 Pain in leg, unspecified: Secondary | ICD-10-CM

## 2018-03-03 DIAGNOSIS — Z23 Encounter for immunization: Secondary | ICD-10-CM | POA: Diagnosis not present

## 2018-03-03 DIAGNOSIS — I252 Old myocardial infarction: Secondary | ICD-10-CM

## 2018-03-03 DIAGNOSIS — F411 Generalized anxiety disorder: Secondary | ICD-10-CM | POA: Diagnosis not present

## 2018-03-03 DIAGNOSIS — R05 Cough: Secondary | ICD-10-CM | POA: Diagnosis not present

## 2018-03-03 DIAGNOSIS — R279 Unspecified lack of coordination: Secondary | ICD-10-CM | POA: Diagnosis not present

## 2018-03-03 DIAGNOSIS — R1312 Dysphagia, oropharyngeal phase: Secondary | ICD-10-CM | POA: Diagnosis not present

## 2018-03-03 DIAGNOSIS — M79661 Pain in right lower leg: Secondary | ICD-10-CM | POA: Diagnosis not present

## 2018-03-03 DIAGNOSIS — R609 Edema, unspecified: Secondary | ICD-10-CM | POA: Diagnosis not present

## 2018-03-03 DIAGNOSIS — I248 Other forms of acute ischemic heart disease: Secondary | ICD-10-CM | POA: Diagnosis present

## 2018-03-03 DIAGNOSIS — R7989 Other specified abnormal findings of blood chemistry: Secondary | ICD-10-CM

## 2018-03-03 DIAGNOSIS — R079 Chest pain, unspecified: Secondary | ICD-10-CM

## 2018-03-03 DIAGNOSIS — B9710 Unspecified enterovirus as the cause of diseases classified elsewhere: Secondary | ICD-10-CM | POA: Diagnosis present

## 2018-03-03 DIAGNOSIS — M79662 Pain in left lower leg: Secondary | ICD-10-CM | POA: Diagnosis not present

## 2018-03-03 DIAGNOSIS — I10 Essential (primary) hypertension: Secondary | ICD-10-CM | POA: Diagnosis not present

## 2018-03-03 DIAGNOSIS — M109 Gout, unspecified: Secondary | ICD-10-CM | POA: Diagnosis not present

## 2018-03-03 DIAGNOSIS — I2510 Atherosclerotic heart disease of native coronary artery without angina pectoris: Secondary | ICD-10-CM | POA: Diagnosis present

## 2018-03-03 DIAGNOSIS — M79605 Pain in left leg: Secondary | ICD-10-CM

## 2018-03-03 DIAGNOSIS — J209 Acute bronchitis, unspecified: Secondary | ICD-10-CM | POA: Diagnosis not present

## 2018-03-03 DIAGNOSIS — J9600 Acute respiratory failure, unspecified whether with hypoxia or hypercapnia: Secondary | ICD-10-CM | POA: Diagnosis present

## 2018-03-03 DIAGNOSIS — R748 Abnormal levels of other serum enzymes: Secondary | ICD-10-CM | POA: Diagnosis not present

## 2018-03-03 DIAGNOSIS — I872 Venous insufficiency (chronic) (peripheral): Secondary | ICD-10-CM | POA: Diagnosis not present

## 2018-03-03 LAB — COMPREHENSIVE METABOLIC PANEL
ALBUMIN: 3.5 g/dL (ref 3.5–5.0)
ALK PHOS: 65 U/L (ref 38–126)
ALT: 12 U/L (ref 0–44)
ANION GAP: 8 (ref 5–15)
AST: 19 U/L (ref 15–41)
BUN: 34 mg/dL — ABNORMAL HIGH (ref 8–23)
CALCIUM: 8.8 mg/dL — AB (ref 8.9–10.3)
CO2: 30 mmol/L (ref 22–32)
Chloride: 102 mmol/L (ref 98–111)
Creatinine, Ser: 1.16 mg/dL — ABNORMAL HIGH (ref 0.44–1.00)
GFR calc Af Amer: 45 mL/min — ABNORMAL LOW (ref >60)
GFR, EST NON AFRICAN AMERICAN: 39 mL/min — AB (ref >60)
GLUCOSE: 118 mg/dL — AB (ref 70–99)
POTASSIUM: 3.8 mmol/L (ref 3.5–5.1)
Sodium: 140 mmol/L (ref 135–145)
TOTAL PROTEIN: 7 g/dL (ref 6.5–8.1)
Total Bilirubin: 0.7 mg/dL (ref 0.3–1.2)

## 2018-03-03 LAB — URINALYSIS, ROUTINE W REFLEX MICROSCOPIC
BILIRUBIN URINE: NEGATIVE
Glucose, UA: NEGATIVE mg/dL
HGB URINE DIPSTICK: NEGATIVE
KETONES UR: NEGATIVE mg/dL
NITRITE: POSITIVE — AB
PROTEIN: NEGATIVE mg/dL
Specific Gravity, Urine: 1.018 (ref 1.005–1.030)
pH: 6 (ref 5.0–8.0)

## 2018-03-03 LAB — CBC WITH DIFFERENTIAL/PLATELET
BASOS PCT: 0 %
Basophils Absolute: 0 10*3/uL (ref 0.0–0.1)
EOS PCT: 1 %
Eosinophils Absolute: 0 10*3/uL (ref 0.0–0.7)
HCT: 31.9 % — ABNORMAL LOW (ref 36.0–46.0)
Hemoglobin: 10.2 g/dL — ABNORMAL LOW (ref 12.0–15.0)
LYMPHS PCT: 13 %
Lymphs Abs: 0.7 10*3/uL (ref 0.7–4.0)
MCH: 27.3 pg (ref 26.0–34.0)
MCHC: 32 g/dL (ref 30.0–36.0)
MCV: 85.5 fL (ref 78.0–100.0)
MONO ABS: 0.4 10*3/uL (ref 0.1–1.0)
Monocytes Relative: 8 %
NEUTROS ABS: 4.1 10*3/uL (ref 1.7–7.7)
Neutrophils Relative %: 78 %
PLATELETS: 174 10*3/uL (ref 150–400)
RBC: 3.73 MIL/uL — AB (ref 3.87–5.11)
RDW: 15.1 % (ref 11.5–15.5)
WBC: 5.3 10*3/uL (ref 4.0–10.5)

## 2018-03-03 LAB — I-STAT CG4 LACTIC ACID, ED: Lactic Acid, Venous: 0.92 mmol/L (ref 0.5–1.9)

## 2018-03-03 LAB — TROPONIN I
Troponin I: 0.03 ng/mL (ref <0.03)
Troponin I: 0.03 ng/mL (ref <0.03)
Troponin I: 0.03 ng/mL (ref <0.03)

## 2018-03-03 LAB — PROCALCITONIN: Procalcitonin: <0.1 ng/mL

## 2018-03-03 LAB — BRAIN NATRIURETIC PEPTIDE: B Natriuretic Peptide: 299 pg/mL — ABNORMAL HIGH (ref 0.0–100.0)

## 2018-03-03 MED ORDER — PRAVASTATIN SODIUM 40 MG PO TABS
40.0000 mg | ORAL_TABLET | Freq: Every day | ORAL | Status: DC
  Administered 2018-03-03 – 2018-03-06 (×4): 40 mg via ORAL
  Filled 2018-03-03 (×4): qty 1

## 2018-03-03 MED ORDER — CYCLOSPORINE 0.05 % OP EMUL
1.0000 [drp] | Freq: Two times a day (BID) | OPHTHALMIC | Status: DC
  Administered 2018-03-03 – 2018-03-07 (×8): 1 [drp] via OPHTHALMIC
  Filled 2018-03-03 (×7): qty 1

## 2018-03-03 MED ORDER — PNEUMOCOCCAL VAC POLYVALENT 25 MCG/0.5ML IJ INJ
0.5000 mL | INJECTION | INTRAMUSCULAR | Status: AC
  Administered 2018-03-04: 0.5 mL via INTRAMUSCULAR
  Filled 2018-03-03: qty 0.5

## 2018-03-03 MED ORDER — IPRATROPIUM-ALBUTEROL 0.5-2.5 (3) MG/3ML IN SOLN
3.0000 mL | Freq: Four times a day (QID) | RESPIRATORY_TRACT | Status: DC
  Administered 2018-03-03 – 2018-03-04 (×5): 3 mL via RESPIRATORY_TRACT
  Filled 2018-03-03 (×4): qty 3

## 2018-03-03 MED ORDER — BRINZOLAMIDE 1 % OP SUSP
1.0000 [drp] | Freq: Three times a day (TID) | OPHTHALMIC | Status: DC
  Administered 2018-03-04 – 2018-03-07 (×10): 1 [drp] via OPHTHALMIC
  Filled 2018-03-03: qty 10

## 2018-03-03 MED ORDER — ACETAMINOPHEN 650 MG RE SUPP: 650.0000 mg | Freq: Four times a day (QID) | RECTAL | Status: DC | PRN

## 2018-03-03 MED ORDER — LORATADINE 10 MG PO TABS
10.0000 mg | ORAL_TABLET | Freq: Every day | ORAL | Status: DC
  Administered 2018-03-04 – 2018-03-07 (×4): 10 mg via ORAL
  Filled 2018-03-03 (×4): qty 1

## 2018-03-03 MED ORDER — DONEPEZIL HCL 5 MG PO TABS
5.0000 mg | ORAL_TABLET | Freq: Every day | ORAL | Status: DC
  Administered 2018-03-04 – 2018-03-07 (×4): 5 mg via ORAL
  Filled 2018-03-03 (×4): qty 1

## 2018-03-03 MED ORDER — CITALOPRAM HYDROBROMIDE 20 MG PO TABS
20.0000 mg | ORAL_TABLET | Freq: Every day | ORAL | Status: DC
  Administered 2018-03-04 – 2018-03-07 (×4): 20 mg via ORAL
  Filled 2018-03-03 (×4): qty 1

## 2018-03-03 MED ORDER — CEFTRIAXONE SODIUM 1 G IJ SOLR
1.0000 g | INTRAMUSCULAR | Status: AC
  Administered 2018-03-04 – 2018-03-05 (×2): 1 g via INTRAVENOUS
  Filled 2018-03-03: qty 1
  Filled 2018-03-03 (×2): qty 10

## 2018-03-03 MED ORDER — PANTOPRAZOLE SODIUM 20 MG PO TBEC
20.0000 mg | DELAYED_RELEASE_TABLET | Freq: Two times a day (BID) | ORAL | Status: DC
  Filled 2018-03-03 (×4): qty 1

## 2018-03-03 MED ORDER — LATANOPROST 0.005 % OP SOLN
1.0000 [drp] | Freq: Every day | OPHTHALMIC | Status: DC
  Administered 2018-03-04 – 2018-03-06 (×3): 1 [drp] via OPHTHALMIC
  Filled 2018-03-03: qty 2.5

## 2018-03-03 MED ORDER — DOCUSATE SODIUM 100 MG PO CAPS
100.0000 mg | ORAL_CAPSULE | Freq: Two times a day (BID) | ORAL | Status: DC
  Administered 2018-03-03 – 2018-03-07 (×8): 100 mg via ORAL
  Filled 2018-03-03 (×8): qty 1

## 2018-03-03 MED ORDER — BRIMONIDINE TARTRATE 0.2 % OP SOLN
1.0000 [drp] | Freq: Three times a day (TID) | OPHTHALMIC | Status: DC
  Administered 2018-03-03 – 2018-03-07 (×13): 1 [drp] via OPHTHALMIC
  Filled 2018-03-03 (×2): qty 5

## 2018-03-03 MED ORDER — IPRATROPIUM-ALBUTEROL 0.5-2.5 (3) MG/3ML IN SOLN
3.0000 mL | Freq: Once | RESPIRATORY_TRACT | Status: AC
  Administered 2018-03-03: 3 mL via RESPIRATORY_TRACT
  Filled 2018-03-03: qty 3

## 2018-03-03 MED ORDER — CEFTRIAXONE SODIUM 1 G IJ SOLR
1.0000 g | Freq: Once | INTRAMUSCULAR | Status: AC
  Administered 2018-03-03: 1 g via INTRAVENOUS
  Filled 2018-03-03: qty 10

## 2018-03-03 MED ORDER — ACETAMINOPHEN 325 MG PO TABS
650.0000 mg | ORAL_TABLET | Freq: Four times a day (QID) | ORAL | Status: DC | PRN
  Administered 2018-03-03 – 2018-03-06 (×2): 650 mg via ORAL
  Filled 2018-03-03 (×2): qty 2

## 2018-03-03 MED ORDER — LORAZEPAM 0.5 MG PO TABS
0.5000 mg | ORAL_TABLET | Freq: Two times a day (BID) | ORAL | Status: DC
  Administered 2018-03-03 – 2018-03-07 (×8): 0.5 mg via ORAL
  Filled 2018-03-03 (×8): qty 1

## 2018-03-03 MED ORDER — ONDANSETRON HCL 4 MG/2ML IJ SOLN: 4.0000 mg | Freq: Four times a day (QID) | INTRAMUSCULAR | Status: DC | PRN

## 2018-03-03 MED ORDER — CALCIUM CARBONATE-VITAMIN D 500-200 MG-UNIT PO TABS
1.0000 | ORAL_TABLET | Freq: Two times a day (BID) | ORAL | Status: DC
  Administered 2018-03-03 – 2018-03-07 (×9): 1 via ORAL
  Filled 2018-03-03 (×9): qty 1

## 2018-03-03 MED ORDER — ENOXAPARIN SODIUM 30 MG/0.3ML ~~LOC~~ SOLN
30.0000 mg | SUBCUTANEOUS | Status: DC
  Administered 2018-03-03 – 2018-03-06 (×4): 30 mg via SUBCUTANEOUS
  Filled 2018-03-03 (×4): qty 0.3

## 2018-03-03 MED ORDER — ONDANSETRON HCL 4 MG PO TABS: 4.0000 mg | ORAL_TABLET | Freq: Four times a day (QID) | ORAL | Status: DC | PRN

## 2018-03-03 NOTE — ED Notes (Signed)
CRITICAL VALUE ALERT  Critical Value:  Troponin 0.03  Date & Time Notied:  03/03/18 117  Provider Notified: Dr. Tomi Bamberger  Orders Received/Actions taken: EDP notified by Benay Pillow, RN. No further orders given.

## 2018-03-03 NOTE — H&P (Addendum)
History and Physical  BOOTS MCGLOWN TGG:[id number] DOB: 1922-04-01 DOA: 03/03/2018   PCP: Wille Celeste, PA-C   Patient coming from: Home  Chief Complaint: generalized weakness, cough, congestion, fever  HPI:  [name] is a 84 y.o. female with medical history of diastolic CHF, CKD stage III, GERD, hyperlipidemia, hypertension, CAD presenting with 3-day history of fevers, chills, cough, and shortness of breath with her symptoms worsening the last 24 hours.  The patient has been producing white sputum without hemoptysis.  In addition, the patient was noted to have a new oxygen requirement at the [facility].  As result, the patient was sent to the emergency department for further evaluation.  She denies any nausea, vomiting, diarrhea, abdominal pain, dysuria, hematuria, headache, neck pain. In the emergency department, the patient was noted to have a temperature 100.6 F but was hemodynamically stable.  She was noted to have oxygen saturation 88% on room air.  BMP, LFTs, CBC were largely unremarkable.  UA shows 21-50 WBC.  Chest x-ray shows interstitial prominence without consolidations.  Patient was started on ceftriaxone.  Assessment/Plan: Acute respiratory failure with hypoxia -Likely due to acute bronchitis -Stable on 2 L presently -Wean oxygen as tolerated for saturation greater than 92% -Check viral respiratory panel  Acute bronchitis -Viral respiratory panel -Start duo nebs -Procalcitonin -7/6 CXR--interstitial prominence without consolidation  Generalized weakness -Likely due to infectious process -PT evaluation  Elevated troponin -Likely due to demand ischemia -EKG sinus rhythm with nonspecific T wave changes -cycle troponins -Echo  CKD stage 3 -baseline creatinine 1.0-1.3 -am BMP  Essential hypertension -Holding amlodipine and HCTZ secondary to soft blood pressure  Leg pain and edema -Venous duplex rule out DVT  Chest pain/Elevated  troponin -Atypical by clinical history -Cycle troponin -Echocardiogram  Hyperlipidemia -continue statin      Past Medical History:  Diagnosis Date  . Anxiety   . Cellulitis 2016  . CHF (congestive heart failure) ([place])   . Chronic kidney disease   . Dementia   . Dysphagia   . Gait abnormality   . GERD (gastroesophageal reflux disease)   . Glaucoma   . Gout   . Hyperlipidemia   . Hypertension   . Muscle weakness   . Myocardial infarction [facility]) [place]  . Osteoarthritis   . Syncope    Past Surgical History:  Procedure Laterality Date  . APPENDECTOMY    . CATARACT EXTRACTION W/PHACO  08/04/2011   Procedure: CATARACT EXTRACTION PHACO AND INTRAOCULAR LENS PLACEMENT (IOC);  Surgeon: Tonny Branch;  Location: [facility];  Service: Ophthalmology;  Laterality: Right;  CDE=18.53  . CATARACT EXTRACTION W/PHACO  08/25/2011   Procedure: CATARACT EXTRACTION PHACO AND INTRAOCULAR LENS PLACEMENT (IOC);  Surgeon: Tonny Branch;  Location: [facility];  Service: Ophthalmology;  Laterality: Left;  CDE:14.76  . TONSILLECTOMY     Social History:  reports that she has never smoked. She has never used smokeless tobacco. She reports that she does not drink alcohol or use drugs.   Family History  Problem Relation Age of Onset  . Diabetes Father   . Diabetes Sister   . Diabetes Brother   . Stroke Sister   . Stroke Sister   . Anesthesia problems Neg Hx   . Hypotension Neg Hx   . Malignant hyperthermia Neg Hx   . Pseudochol deficiency Neg Hx      Allergies  Allergen Reactions  . Bee Venom Anaphylaxis  . Angiotensin Receptor Blockers  Tongue swelling.  . Aspirin     Directed by physician  . Cabbage Other (See Comments)    Certain foods due to gout flares  . Latex Rash     Prior to Admission medications   Medication Sig Start Date End Date Taking? Authorizing Provider  acetaminophen (TYLENOL) 500 MG tablet Take 1,000 mg by mouth 2 (two) times daily. For leg pain and arthritis.  May have additional 1000 mg for break through pain    [provider]  amLODipine (NORVASC) 5 MG tablet Take 5 mg by mouth daily.    [provider]  Janne Lab Oil [facility] OINT Apply to bilateral buttocks, sacrum and coccyx q shift and prn incontinence for protection and moisture related skin irritation    [provider]  Brinzolamide-Brimonidine (SIMBRINZA) 1-0.2 % SUSP Apply to eye. One  drop into both eyes three times a day    [provider]  Calcium Carbonate-Vitamin D (CALCIUM-VITAMIN D) 500-200 MG-UNIT tablet Take 1 tablet by mouth 2 (two) times daily.    [provider]  citalopram (CELEXA) 20 MG tablet Take 20 mg by mouth daily.    [provider]  cycloSPORINE (RESTASIS) 0.05 % ophthalmic emulsion Place 1 drop into both eyes 2 (two) times daily.    [provider]  docusate sodium (COLACE) 100 MG capsule Take 100 mg by mouth 2 (two) times daily.    [provider]  donepezil (ARICEPT) 10 MG tablet Take 5 mg by mouth daily. For dementia    [provider]  hydrALAZINE (APRESOLINE) 25 MG tablet Take 1 1/2 tab to= 37.5 mg by mouth at bedtime    [provider]  hydrochlorothiazide (HYDRODIURIL) 12.5 MG tablet Take 12.5 mg by mouth daily.    [provider]  ipratropium-albuterol (DUONEB) 0.5-2.5 (3) MG/3ML SOLN Take 3 mLs by nebulization every 6 (six) hours as needed.    [provider]  loratadine (CLARITIN) 10 MG tablet Take 10 mg by mouth daily.    [provider]  LORazepam (ATIVAN) 0.5 MG tablet Take 1 tablet (0.5 mg total) by mouth 2 (two) times daily. 02/07/18   Granville Lewis C, PA-C  lovastatin (MEVACOR) 40 MG tablet Take 40 mg by mouth at bedtime.    [provider]  nitroGLYCERIN (NITROSTAT) 0.4 MG SL tablet Place 0.4 mg under the tongue every 5 (five) minutes as needed for chest pain.     [provider]  pantoprazole (PROTONIX) 40 MG  tablet Take 20 mg by mouth 2 (two) times daily.     [provider]  potassium chloride (K-DUR,KLOR-CON) 10 MEQ tablet Take 10 mEq by mouth daily.    [provider]  Travoprost, BAK Free, (TRAVATAN) 0.004 % SOLN ophthalmic solution Place 1 drop into both eyes at bedtime. For glaucoma    [provider]    Review of Systems:  Constitutional:  No weight loss, night sweats Head&Eyes: No headache.  No vision loss.  No eye pain or scotoma ENT:  No Difficulty swallowing,Tooth/dental problems,Sore throat,  No ear ache, post nasal drip,  Cardio-vascular:  No chest pain, Orthopnea, PND, swelling in lower extremities,  dizziness, palpitations  GI:  No  abdominal pain, nausea, vomiting, diarrhea, loss of appetite, hematochezia, melena, heartburn, indigestion, Resp:  No shortness of breath with exertion or at rest. No chest wall deformity  Skin:  no rash or lesions.  GU:  no dysuria, change in color of urine, no urgency or frequency.  No flank pain.  Musculoskeletal:  No joint pain or swelling. No decreased range of motion. No back pain.  Psych:  No change in mood or affect. No depression or anxiety. Neurologic: No headache, no dysesthesia, no focal weakness, no vision loss. No syncope  Physical Exam: Vitals:   03/03/18 1030 03/03/18 1034 03/03/18 1100 03/03/18 1300  BP: (!) 131/49  (!) 121/45 (!) 130/45  Pulse: (!) 58  60 (!) 59  Resp: (!) 21  20 20   Temp:      TempSrc:      SpO2: 99% 99% 100% 99%  Weight:       General:  A&O x 3, NAD, nontoxic, pleasant/cooperative Head/Eye: No conjunctival hemorrhage, no icterus, [place]/AT, No nystagmus ENT:  No icterus,  No thrush, good dentition, no pharyngeal exudate Neck:  No masses, no lymphadenpathy, no bruits CV:  RRR, no rub, no gallop, no S3 Lung:  Bibasilar crackles, bilateral exp wheez Abdomen: soft/NT, +BS, nondistended, no peritoneal signs Ext: No cyanosis, No rashes, No petechiae, No lymphangitis, No  edema Neuro: CNII-XII intact, strength 4/5 in bilateral upper and lower extremities, no dysmetria  Labs on Admission:  Basic Metabolic Panel: Recent Labs  Lab 03/03/18 1008  NA 140  K 3.8  CL 102  CO2 30  GLUCOSE 118*  BUN 34*  CREATININE 1.16*  CALCIUM 8.8*   Liver Function Tests: Recent Labs  Lab 03/03/18 1008  AST 19  ALT 12  ALKPHOS 65  BILITOT 0.7  PROT 7.0  ALBUMIN 3.5   No results for input(s): LIPASE, AMYLASE in the last 168 hours. No results for input(s): AMMONIA in the last 168 hours. CBC: Recent Labs  Lab 03/03/18 1008  WBC 5.3  NEUTROABS 4.1  HGB 10.2*  HCT 31.9*  MCV 85.5  PLT 174   Coagulation Profile: No results for input(s): INR, PROTIME in the last 168 hours. Cardiac Enzymes: Recent Labs  Lab 03/03/18 1008  TROPONINI 0.03*   BNP: Invalid input(s): POCBNP CBG: No results for input(s): GLUCAP in the last 168 hours. Urine analysis:    Component Value Date/Time   COLORURINE YELLOW 03/03/2018 1200   APPEARANCEUR HAZY (A) 03/03/2018 1200   LABSPEC 1.018 03/03/2018 1200   PHURINE 6.0 03/03/2018 1200   GLUCOSEU NEGATIVE 03/03/2018 1200   GLUCOSEU NEG mg/dL 05/21/2009 2149   HGBUR NEGATIVE 03/03/2018 1200   BILIRUBINUR NEGATIVE 03/03/2018 1200   KETONESUR NEGATIVE 03/03/2018 1200   PROTEINUR NEGATIVE 03/03/2018 1200   UROBILINOGEN 1 05/21/2009 2149   NITRITE POSITIVE (A) 03/03/2018 1200   LEUKOCYTESUR LARGE (A) 03/03/2018 1200   Sepsis Labs: @LABRCNTIP (procalcitonin:4,lacticidven:4) ) Recent Results (from the past 240 hour(s))  Blood Culture (routine x 2)     Status: None (Preliminary result)   Collection Time: 03/03/18 10:10 AM  Result Value Ref Range Status   Specimen Description   Final    BLOOD RIGHT FOREARM BOTTLES DRAWN AEROBIC AND ANAEROBIC   Special Requests   Final    Blood Culture results may not be optimal due to an excessive volume of blood received in culture bottles Performed at [facility], [address]  [place], [place] [address]    Culture PENDING  Incomplete   Report Status PENDING  Incomplete  Blood Culture (routine x 2)     Status: None (Preliminary result)   Collection Time: 03/03/18 10:14 AM  Result Value Ref Range Status   Specimen Description   Final    BLOOD LEFT HAND BOTTLES DRAWN AEROBIC AND ANAEROBIC  Special Requests   Final    Blood Culture adequate volume Performed at [facility], [address] [place], [place] [address]    Culture PENDING  Incomplete   Report Status PENDING  Incomplete     Radiological Exams on Admission: Dg Chest Port 1 View  Result Date: 03/03/2018 CLINICAL DATA:  Cough and fever EXAM: PORTABLE CHEST 1 VIEW COMPARISON:  Chest radiograph July 30, 2015 and chest CT June 07, 2017 FINDINGS: There is no edema or consolidation. Heart is mildly enlarged with pulmonary vascularity normal. No adenopathy. There is degenerative change in each shoulder. IMPRESSION: Stable cardiac prominence.  No edema or consolidation. Electronically Signed   By: Lowella Grip III M.D.   On: 03/03/2018 10:32    EKG: Independently reviewed. Sinus, nonspecific T wave inversion    Time spent:60 minutes Code Status:   DNR Family Communication:  No Family at bedside Disposition Plan: expect 2-3 day hospitalization Consults called: none DVT Prophylaxis: [place] Lovenox  Orson Eva, DO  [facility] Pager [phone]  If 7PM-7AM, please contact night-coverage [email or web address] Password [email or web address] 03/03/2018, 1:59 PM

## 2018-03-03 NOTE — ED Provider Notes (Signed)
[facility] EMERGENCY DEPARTMENT Provider Note   CSN: [id number] Arrival date & time: 03/03/18  4580     History   Chief Complaint Chief Complaint  Patient presents with  . Fever    HPI [name] is a 84 y.o. female.  HPI Patient presents to the emergency room for evaluation of fever and cough.  Patient is a resident of a nursing home.  2 days ago she started having trouble with cough and congestion.  Patient is normally not on any oxygen but today the staff at the facility noticed her oxygen level was decreased.  She has continued to have a productive cough.  Patient states she is having some congestion in her chest.  She also has pain in her chest when she coughs.  She also has some pain in her abdomen when she coughs.  She denies any vomiting or diarrhea.  No dysuria. Past Medical History:  Diagnosis Date  . Anxiety   . Cellulitis 2016  . CHF (congestive heart failure) ([place])   . Chronic kidney disease   . Dementia   . Dysphagia   . Gait abnormality   . GERD (gastroesophageal reflux disease)   . Glaucoma   . Gout   . Hyperlipidemia   . Hypertension   . Muscle weakness   . Myocardial infarction [facility]) [place]  . Osteoarthritis   . Syncope     Patient Active Problem List   Diagnosis Date Noted  . Anemia 08/09/2017  . Diastolic CHF ([place]) 99/83/3825  . Chronic renal disease 06/21/2016  . Hyperglycemia 12/31/2015  . DVT (deep venous thrombosis) ([place]) 05/22/2015  . GERD 05/04/2010  . GOUT, UNSPECIFIED 01/12/2010  . Depression 07/05/2009  . CALLUSES, FEET, BILATERAL 05/21/2009  . Allergic rhinitis 12/21/2008  . Hyperlipidemia 11/07/2007  . Dementia with behavioral disturbance 11/07/2007  . Anxiety state 11/07/2007  . Essential hypertension 11/07/2007  . OSTEOARTHRITIS 11/07/2007    Past Surgical History:  Procedure Laterality Date  . APPENDECTOMY    . CATARACT EXTRACTION W/PHACO  08/04/2011   Procedure: CATARACT EXTRACTION PHACO AND  INTRAOCULAR LENS PLACEMENT (IOC);  Surgeon: Tonny Branch;  Location: [facility];  Service: Ophthalmology;  Laterality: Right;  CDE=18.53  . CATARACT EXTRACTION W/PHACO  08/25/2011   Procedure: CATARACT EXTRACTION PHACO AND INTRAOCULAR LENS PLACEMENT (IOC);  Surgeon: Tonny Branch;  Location: [facility];  Service: Ophthalmology;  Laterality: Left;  CDE:14.76  . TONSILLECTOMY       OB History   None      Home Medications    Prior to Admission medications   Medication Sig Start Date End Date Taking? Authorizing Provider  acetaminophen (TYLENOL) 500 MG tablet Take 1,000 mg by mouth 2 (two) times daily. For leg pain and arthritis. May have additional 1000 mg for break through pain    [provider]  amLODipine (NORVASC) 5 MG tablet Take 5 mg by mouth daily.    [provider]  Janne Lab [facility]) OINT Apply to bilateral buttocks, sacrum and coccyx q shift and prn incontinence for protection and moisture related skin irritation    [provider]  Brinzolamide-Brimonidine (SIMBRINZA) 1-0.2 % SUSP Apply to eye. One  drop into both eyes three times a day    [provider]  Calcium Carbonate-Vitamin D (CALCIUM-VITAMIN D) 500-200 MG-UNIT tablet Take 1 tablet by mouth 2 (two) times daily.    [provider]  citalopram (CELEXA) 20 MG tablet Take 20 mg by mouth daily.  [provider]  cycloSPORINE (RESTASIS) 0.05 % ophthalmic emulsion Place 1 drop into both eyes 2 (two) times daily.    [provider]  docusate sodium (COLACE) 100 MG capsule Take 100 mg by mouth 2 (two) times daily.    [provider]  donepezil (ARICEPT) 10 MG tablet Take 5 mg by mouth daily. For dementia    [provider]  hydrALAZINE (APRESOLINE) 25 MG tablet Take 1 1/2 tab to= 37.5 mg by mouth at bedtime    [provider]  hydrochlorothiazide (HYDRODIURIL) 12.5 MG tablet Take 12.5 mg by mouth daily.    [provider]    ipratropium-albuterol (DUONEB) 0.5-2.5 (3) MG/3ML SOLN Take 3 mLs by nebulization every 6 (six) hours as needed.    [provider]  loratadine (CLARITIN) 10 MG tablet Take 10 mg by mouth daily.    [provider]  LORazepam (ATIVAN) 0.5 MG tablet Take 1 tablet (0.5 mg total) by mouth 2 (two) times daily. 02/07/18   [place]anville Lewis C, PA-C  lovastatin (MEVACOR) 40 MG tablet Take 40 mg by mouth at bedtime.    [provider]  nitroGLYCERIN (NITROSTAT) 0.4 MG SL tablet Place 0.4 mg under the tongue every 5 (five) minutes as needed for chest pain.     [provider]  pantoprazole (PROTONIX) 40 MG tablet Take 20 mg by mouth 2 (two) times daily.     [provider]  potassium chloride (K-DUR,KLOR-CON) 10 MEQ tablet Take 10 mEq by mouth daily.    [provider]  Travoprost, BAK Free, (TRAVATAN) 0.004 % SOLN ophthalmic solution Place 1 drop into both eyes at bedtime. For glaucoma    [provider]    Family History Family History  Problem Relation Age of Onset  . Diabetes Father   . Diabetes Sister   . Diabetes Brother   . Stroke Sister   . Stroke Sister   . Anesthesia problems Neg Hx   . Hypotension Neg Hx   . Malignant hyperthermia Neg Hx   . Pseudochol deficiency Neg Hx     Social History Social History   Tobacco Use  . Smoking status: Never Smoker  . Smokeless tobacco: Never Used  Substance Use Topics  . Alcohol use: No  . Drug use: No     Allergies   Bee venom; Angiotensin receptor blockers; Aspirin; Cabbage; and Latex   Review of Systems Review of Systems  All other systems reviewed and are negative.    Physical Exam Updated Vital Signs BP (!) 121/45   Pulse 60   Temp (!) 100.6 F (38.1 C) (Rectal)   Resp 20   Wt 79.4 kg (175 lb)   SpO2 100%   BMI 35.35 kg/m   Physical Exam  Constitutional: No distress.  Elderly  HENT:  Head: Normocephalic and atraumatic.  Right Ear: External ear normal.   Left Ear: External ear normal.  Eyes: Conjunctivae are normal. Right eye exhibits no discharge. Left eye exhibits no discharge. No scleral icterus.  Neck: Neck supple. No tracheal deviation present.  Cardiovascular: Normal rate, regular rhythm and intact distal pulses.  Pulmonary/Chest: Effort normal. No stridor. No respiratory distress. She has wheezes. She has rhonchi. She has no rales.  Abdominal: Soft. Bowel sounds are normal. She exhibits no distension. There is no tenderness. There is no rebound and no guarding.  Musculoskeletal: She exhibits edema. She exhibits no tenderness.  Chronic venous stasis changes in the lower extremities, lichenification of the skin  Neurological: She is alert. She has normal strength. No cranial nerve deficit (no facial droop, extraocular movements intact, no slurred speech) or sensory deficit. She exhibits normal muscle tone. She displays no seizure activity. Coordination normal.  Skin: Skin is warm and dry. No rash noted.  Psychiatric: She has a normal mood and affect.  Nursing note and vitals reviewed.    ED Treatments / Results  Labs (all labs ordered are listed, but only abnormal results are displayed) Labs Reviewed  COMPREHENSIVE METABOLIC PANEL - Abnormal; Notable for the following components:      Result Value   Glucose, Bld 118 (*)    BUN 34 (*)    Creatinine, Ser 1.16 (*)    Calcium 8.8 (*)    GFR calc non Af Amer 39 (*)    GFR calc Af Amer 45 (*)    All other components within normal limits  CBC WITH DIFFERENTIAL/PLATELET - Abnormal; Notable for the following components:   RBC 3.73 (*)    Hemoglobin 10.2 (*)    HCT 31.9 (*)    All other components within normal limits  URINALYSIS, ROUTINE W REFLEX MICROSCOPIC - Abnormal; Notable for the following components:   APPearance HAZY (*)    Nitrite POSITIVE (*)    Leukocytes, UA LARGE (*)    Bacteria, UA RARE (*)    All other components within normal limits  BRAIN NATRIURETIC PEPTIDE -  Abnormal; Notable for the following components:   B Natriuretic Peptide 299.0 (*)    All other components within normal limits  TROPONIN I - Abnormal; Notable for the following components:   Troponin I 0.03 (*)    All other components within normal limits  CULTURE, BLOOD (ROUTINE X 2)  CULTURE, BLOOD (ROUTINE X 2)  URINE CULTURE  I-STAT CG4 LACTIC ACID, ED  I-STAT CG4 LACTIC ACID, ED    EKG EKG Interpretation  Date/Time:  Saturday March 03 2018 09:36:32 EDT Ventricular Rate:  63 PR Interval:    QRS Duration: 96 QT Interval:  457 QTC Calculation: 468 R Axis:   -57 Text Interpretation:  Sinus rhythm LAD, consider left anterior fascicular block Abnormal R-wave progression, late transition Abnormal T, consider ischemia, diffuse leads No old tracing to compare Confirmed by Dorie Rank [phone]) on 03/03/2018 9:41:03 AM   Radiology Dg Chest Port 1 View  Result Date: 03/03/2018 CLINICAL DATA:  Cough and fever EXAM: PORTABLE CHEST 1 VIEW COMPARISON:  Chest radiograph July 30, 2015 and chest CT June 07, 2017 FINDINGS: There is no edema or consolidation. Heart is mildly enlarged with pulmonary vascularity normal. No adenopathy. There is degenerative change in each shoulder. IMPRESSION: Stable cardiac prominence.  No edema or consolidation. Electronically Signed   By: Lowella Grip III M.D.   On: 03/03/2018 10:32    Procedures Procedures (including critical care time)  Medications Ordered in ED Medications  cefTRIAXone (ROCEPHIN) 1 g in sodium chloride 0.9 % 100 mL IVPB (has no administration in time range)  ipratropium-albuterol (DUONEB) 0.5-2.5 (3) MG/3ML nebulizer solution 3 mL (3 mLs Nebulization Given 03/03/18 1034)     Initial Impression / Assessment and Plan / ED Course  I have reviewed the triage vital signs and the nursing notes.  Pertinent labs & imaging results that were available during my care of the patient were reviewed by me and considered in my medical decision  making (see chart for details).  Clinical Course as of Mar 03 1309  Sat Mar 03, 2018  1307 Hemoglobin decreased  from prior values  CBC WITH DIFFERENTIAL(!) [JK]  1307 Consistent with urinary tract infection  Urinalysis, Routine w reflex microscopic(!) [JK]  1308 Renal function is stable  Comprehensive metabolic panel(!) [JK]  5374 BMP and troponin also slightly elevated but I doubt this is clinically significant at this time.  Will need to follow her troponins   [JK]    Clinical Course User Index [JK] Dorie Rank, MD    Patient presented to the emergency room for evaluation of fever, cough, and decreased O2 sats.  Patient is also noticed increasing weakness.  Her initial oxygen saturation was 88% on room air.  She has was improved with 2 L of nasal cannula oxygen.  Her laboratory tests are notable for mild decrease in her hemoglobin.  No pneumonia noted on her chest x-ray however she also does appear to have a urinary tract infection.  Patient did have some wheezing on exam initially.  I suspect she has a bronchitis type infection but no clear pneumonia.  Her fever is likely a result of her pulmonary infection and possible urinary tract infection.  No signs of sepsis.  She does have some mild increase in her troponin and BNP but no overt evidence of pulmonary edema.  Patient has been started on IV antibiotics.  I will consult the medical service for admission and further treatment considering her age, comorbidities, abnormal lab tests and oxygen requirement. Final Clinical Impressions(s) / ED Diagnoses   Final diagnoses:  Urinary tract infection without hematuria, site unspecified  Bronchitis  Weakness  Fever and chills      Dorie Rank, MD 03/03/18 1310

## 2018-03-03 NOTE — ED Notes (Signed)
CRITICAL VALUE ALERT  Critical Value:  Trop 0.03  Date & Time Notied:  03/03/18 1102  Provider Notified: j knapp  Orders Received/Actions taken:

## 2018-03-03 NOTE — ED Triage Notes (Signed)
Pt from [facility] with elevated fever, decreased O2 sat on RA, and productive cough. Pt states sx started two days ago with increased SOB.

## 2018-03-04 ENCOUNTER — Inpatient Hospital Stay (HOSPITAL_COMMUNITY): Payer: Medicare Other

## 2018-03-04 DIAGNOSIS — R079 Chest pain, unspecified: Secondary | ICD-10-CM

## 2018-03-04 DIAGNOSIS — N390 Urinary tract infection, site not specified: Secondary | ICD-10-CM

## 2018-03-04 LAB — BASIC METABOLIC PANEL
Anion gap: 7 (ref 5–15)
BUN: 28 mg/dL — AB (ref 8–23)
CALCIUM: 8.8 mg/dL — AB (ref 8.9–10.3)
CO2: 32 mmol/L (ref 22–32)
CREATININE: 1.1 mg/dL — AB (ref 0.44–1.00)
Chloride: 104 mmol/L (ref 98–111)
GFR calc Af Amer: 48 mL/min — ABNORMAL LOW (ref >60)
GFR calc non Af Amer: 42 mL/min — ABNORMAL LOW (ref >60)
GLUCOSE: 84 mg/dL (ref 70–99)
Potassium: 3.5 mmol/L (ref 3.5–5.1)
Sodium: 143 mmol/L (ref 135–145)

## 2018-03-04 LAB — RESPIRATORY PANEL BY PCR
Adenovirus: NOT DETECTED
BORDETELLA PERTUSSIS-RVPCR: NOT DETECTED
CHLAMYDOPHILA PNEUMONIAE-RVPPCR: NOT DETECTED
CORONAVIRUS HKU1-RVPPCR: NOT DETECTED
Coronavirus 229E: NOT DETECTED
Coronavirus NL63: NOT DETECTED
Coronavirus OC43: NOT DETECTED
INFLUENZA A-RVPPCR: NOT DETECTED
INFLUENZA B-RVPPCR: NOT DETECTED
METAPNEUMOVIRUS-RVPPCR: NOT DETECTED
Mycoplasma pneumoniae: NOT DETECTED
PARAINFLUENZA VIRUS 4-RVPPCR: NOT DETECTED
Parainfluenza Virus 1: NOT DETECTED
Parainfluenza Virus 2: NOT DETECTED
Parainfluenza Virus 3: NOT DETECTED
RESPIRATORY SYNCYTIAL VIRUS-RVPPCR: NOT DETECTED
RHINOVIRUS / ENTEROVIRUS - RVPPCR: DETECTED — AB

## 2018-03-04 LAB — CBC
HCT: 31.7 % — ABNORMAL LOW (ref 36.0–46.0)
Hemoglobin: 9.8 g/dL — ABNORMAL LOW (ref 12.0–15.0)
MCH: 26.6 pg (ref 26.0–34.0)
MCHC: 30.9 g/dL (ref 30.0–36.0)
MCV: 86.1 fL (ref 78.0–100.0)
PLATELETS: 166 10*3/uL (ref 150–400)
RBC: 3.68 MIL/uL — ABNORMAL LOW (ref 3.87–5.11)
RDW: 15.5 % (ref 11.5–15.5)
WBC: 3.4 10*3/uL — ABNORMAL LOW (ref 4.0–10.5)

## 2018-03-04 LAB — ECHOCARDIOGRAM COMPLETE
HEIGHTINCHES: 59 in
WEIGHTICAEL: 2740.76 [oz_av]

## 2018-03-04 LAB — URINE CULTURE: CULTURE: NO GROWTH

## 2018-03-04 LAB — MRSA PCR SCREENING: MRSA BY PCR: POSITIVE — AB

## 2018-03-04 MED ORDER — AMLODIPINE BESYLATE 5 MG PO TABS
5.0000 mg | ORAL_TABLET | Freq: Every day | ORAL | Status: DC
  Administered 2018-03-04 – 2018-03-07 (×4): 5 mg via ORAL
  Filled 2018-03-04 (×4): qty 1

## 2018-03-04 MED ORDER — PANTOPRAZOLE SODIUM 40 MG PO TBEC
40.0000 mg | DELAYED_RELEASE_TABLET | Freq: Every day | ORAL | Status: DC
  Administered 2018-03-04 – 2018-03-07 (×4): 40 mg via ORAL
  Filled 2018-03-04 (×3): qty 1

## 2018-03-04 MED ORDER — METHYLPREDNISOLONE SODIUM SUCC 125 MG IJ SOLR
60.0000 mg | Freq: Two times a day (BID) | INTRAMUSCULAR | Status: DC
  Administered 2018-03-04 – 2018-03-06 (×5): 60 mg via INTRAVENOUS
  Filled 2018-03-04 (×5): qty 2

## 2018-03-04 MED ORDER — POTASSIUM CHLORIDE CRYS ER 10 MEQ PO TBCR
10.0000 meq | EXTENDED_RELEASE_TABLET | Freq: Once | ORAL | Status: AC
  Administered 2018-03-04: 10 meq via ORAL
  Filled 2018-03-04: qty 1

## 2018-03-04 NOTE — Progress Notes (Signed)
*  PRELIMINARY RESULTS* Echocardiogram 2D Echocardiogram has been performed.  Leavy Cella 03/04/2018, 9:41 AM

## 2018-03-04 NOTE — Progress Notes (Signed)
PROGRESS NOTE  [name] TML:[id number] DOB: November 04, 1921 DOA: 03/03/2018 PCP: Wille Celeste, PA-C  Brief History:   84 y.o. female with medical history of diastolic CHF, CKD stage III, GERD, hyperlipidemia, hypertension, CAD presenting with 3-day history of fevers, chills, cough, and shortness of breath with her symptoms worsening the last 24 hours.  The patient has been producing white sputum without hemoptysis.  In addition, the patient was noted to have a new oxygen requirement at the [facility].  As result, the patient was sent to the emergency department for further evaluation.  She denies any nausea, vomiting, diarrhea, abdominal pain, dysuria, hematuria, headache, neck pain. In the emergency department, the patient was noted to have a temperature 100.6 F but was hemodynamically stable.  She was noted to have oxygen saturation 88% on room air.  BMP, LFTs, CBC were largely unremarkable.  UA shows 21-50 WBC.  Chest x-ray shows interstitial prominence without consolidations.  Patient was started on ceftriaxone.    Assessment/Plan: Acute respiratory failure with hypoxia -Likely due to acute bronchitis -Stable on 2 L presently -Wean oxygen as tolerated for saturation greater than 92% -Check viral respiratory panel--pending  Acute bronchitis with bronchospasm -Viral respiratory panel -continue duo nebs -Procalcitonin <0.10 -start IV solumedrol -7/6 CXR personally reviewed--interstitial prominence without consolidation  Generalized weakness -Likely due to infectious process -PT evaluation -B12 -TSH  UTI -continue ceftriaxone pending urine culture  Elevated troponin -Likely due to demand ischemia -EKG sinus rhythm with nonspecific T wave changes -cycle troponins--0.03 x 3 -Echo--EF 65 to 70%, grade 1 DD, no WMA, mild left ear, trivial MR/TR  CKD stage 3 -baseline creatinine 1.0-1.3 -am BMP  Essential hypertension -Holding amlodipine and HCTZ  secondary to soft blood pressure initially -restart amlodipine  Leg pain and edema -Venous duplex rule out DVT  Hyperlipidemia -continue statin   Disposition Plan:   SNF 1-2 days  Family Communication:  No Family at bedside  Consultants:  none  Code Status:  DNR  DVT Prophylaxis:  [place] Lovenox   Procedures: As Listed in Progress Note Above  Antibiotics: ceftiraxone 7/6>>>    Subjective: Patient states that she is breathing better but she continues to have a nonproductive cough.  She still has some shortness of breath but it is improving.  She denies any headache, chest pain, nausea, vomiting, diarrhea.  There is no dysuria hematuria.  There is no hematochezia or melena.  She has abdominal pain with coughing.  Objective: Vitals:   03/03/18 2024 03/03/18 2116 03/04/18 0630 03/04/18 0902  BP:  (!) 139/51 (!) 176/60   Pulse: 67 79 (!) 58   Resp: 16 19 17    Temp:  100.3 F (37.9 C) 98 F (36.7 C)   TempSrc:  Oral Oral   SpO2: 94% 97% 100% 98%  Weight:   77.7 kg (171 lb 4.8 oz)   Height:        Intake/Output Summary (Last 24 hours) at 03/04/2018 1039 Last data filed at 03/04/2018 0700 Gross per 24 hour  Intake 340 ml  Output 800 ml  Net -460 ml   Weight change:  Exam:   General:  Pt is alert, follows commands appropriately, not in acute distress  HEENT: No icterus, No thrush, No neck mass, [place]/AT  Cardiovascular: RRR, S1/S2, no rubs, no gallops  Respiratory: Bibasilar rales.  Bilateral expiratory wheeze.   Abdomen: Soft/+BS, non tender, non distended, no guarding  Extremities: 1 + LE edema,  No lymphangitis, No petechiae, No rashes, no synovitis   Data Reviewed: I have personally reviewed following labs and imaging studies Basic Metabolic Panel: Recent Labs  Lab 03/03/18 1008 03/04/18 0703  NA 140 143  K 3.8 3.5  CL 102 104  CO2 30 32  GLUCOSE 118* 84  BUN 34* 28*  CREATININE 1.16* 1.10*  CALCIUM 8.8* 8.8*   Liver Function Tests: Recent Labs   Lab 03/03/18 1008  AST 19  ALT 12  ALKPHOS 65  BILITOT 0.7  PROT 7.0  ALBUMIN 3.5   No results for input(s): LIPASE, AMYLASE in the last 168 hours. No results for input(s): AMMONIA in the last 168 hours. Coagulation Profile: No results for input(s): INR, PROTIME in the last 168 hours. CBC: Recent Labs  Lab 03/03/18 1008 03/04/18 0703  WBC 5.3 3.4*  NEUTROABS 4.1  --   HGB 10.2* 9.8*  HCT 31.9* 31.7*  MCV 85.5 86.1  PLT 174 166   Cardiac Enzymes: Recent Labs  Lab 03/03/18 1008 03/03/18 1530 03/03/18 2127  TROPONINI 0.03* 0.03* 0.03*   BNP: Invalid input(s): POCBNP CBG: No results for input(s): GLUCAP in the last 168 hours. HbA1C: No results for input(s): HGBA1C in the last 72 hours. Urine analysis:    Component Value Date/Time   COLORURINE YELLOW 03/03/2018 1200   APPEARANCEUR HAZY (A) 03/03/2018 1200   LABSPEC 1.018 03/03/2018 1200   PHURINE 6.0 03/03/2018 1200   GLUCOSEU NEGATIVE 03/03/2018 1200   GLUCOSEU NEG mg/dL 05/21/2009 2149   HGBUR NEGATIVE 03/03/2018 1200   BILIRUBINUR NEGATIVE 03/03/2018 1200   KETONESUR NEGATIVE 03/03/2018 1200   PROTEINUR NEGATIVE 03/03/2018 1200   UROBILINOGEN 1 05/21/2009 2149   NITRITE POSITIVE (A) 03/03/2018 1200   LEUKOCYTESUR LARGE (A) 03/03/2018 1200   Sepsis Labs: @LABRCNTIP (procalcitonin:4,lacticidven:4) ) Recent Results (from the past 240 hour(s))  Blood Culture (routine x 2)     Status: None (Preliminary result)   Collection Time: 03/03/18 10:10 AM  Result Value Ref Range Status   Specimen Description   Final    BLOOD RIGHT FOREARM BOTTLES DRAWN AEROBIC AND ANAEROBIC   Special Requests   Final    Blood Culture results may not be optimal due to an excessive volume of blood received in culture bottles   Culture   Final    NO GROWTH < 24 HOURS Performed at [facility], [address] [place], [place] [address]    Report Status PENDING  Incomplete  Blood Culture (routine x 2)     Status: None (Preliminary  result)   Collection Time: 03/03/18 10:14 AM  Result Value Ref Range Status   Specimen Description   Final    BLOOD LEFT HAND BOTTLES DRAWN AEROBIC AND ANAEROBIC   Special Requests Blood Culture adequate volume  Final   Culture   Final    NO GROWTH < 24 HOURS Performed at [facility], [address] [place], [place] [address]    Report Status PENDING  Incomplete     Scheduled Meds: . brimonidine  1 drop Both Eyes TID  . brinzolamide  1 drop Both Eyes TID  . calcium-vitamin D  1 tablet Oral BID  . citalopram  20 mg Oral Daily  . cycloSPORINE  1 drop Both Eyes BID  . docusate sodium  100 mg Oral BID  . donepezil  5 mg Oral Daily  . enoxaparin (LOVENOX) injection  30 mg Subcutaneous Q24H  . ipratropium-albuterol  3 mL Nebulization Q6H  . latanoprost  1 drop  Both Eyes QHS  . loratadine  10 mg Oral Daily  . LORazepam  0.5 mg Oral BID  . methylPREDNISolone (SOLU-MEDROL) injection  60 mg Intravenous Q12H  . pantoprazole  40 mg Oral Daily  . pravastatin  40 mg Oral q1800   Continuous Infusions: . cefTRIAXone (ROCEPHIN)  IV      Procedures/Studies: US Venous Img Lower Bilateral  Result Date: 03/04/2018 CLINICAL DATA:  Bilateral lower extremity pain. Shortness of breath for 3 days. History of prior DVT. Evaluate for acute or chronic DVT. EXAM: BILATERAL LOWER EXTREMITY VENOUS DOPPLER ULTRASOUND TECHNIQUE: Gray-scale sonography with graded compression, as well as color Doppler and duplex ultrasound were performed to evaluate the lower extremity deep venous systems from the level of the common femoral vein and including the common femoral, femoral, profunda femoral, popliteal and calf veins including the posterior tibial, peroneal and gastrocnemius veins when visible. The superficial great saphenous vein was also interrogated. Spectral Doppler was utilized to evaluate flow at rest and with distal augmentation maneuvers in the common femoral, femoral and popliteal veins. COMPARISON:  None.  FINDINGS: RIGHT LOWER EXTREMITY Common Femoral Vein: No evidence of thrombus. Normal compressibility, respiratory phasicity and response to augmentation. Saphenofemoral Junction: No evidence of thrombus. Normal compressibility and flow on color Doppler imaging. Profunda Femoral Vein: No evidence of thrombus. Normal compressibility and flow on color Doppler imaging. Femoral Vein: No evidence of thrombus. Normal compressibility, respiratory phasicity and response to augmentation. Popliteal Vein: No evidence of thrombus. Normal compressibility, respiratory phasicity and response to augmentation. Calf Veins: No evidence of thrombus. Normal compressibility and flow on color Doppler imaging. Superficial Great Saphenous Vein: No evidence of thrombus. Normal compressibility. Venous Reflux:  None. Other Findings:  None. LEFT LOWER EXTREMITY Common Femoral Vein: No evidence of thrombus. Normal compressibility, respiratory phasicity and response to augmentation. Saphenofemoral Junction: No evidence of thrombus. Normal compressibility and flow on color Doppler imaging. Profunda Femoral Vein: No evidence of thrombus. Normal compressibility and flow on color Doppler imaging. Femoral Vein: No evidence of thrombus. Normal compressibility, respiratory phasicity and response to augmentation. Popliteal Vein: No evidence of thrombus. Normal compressibility, respiratory phasicity and response to augmentation. Calf Veins: No evidence of thrombus. Normal compressibility and flow on color Doppler imaging. Superficial Great Saphenous Vein: No evidence of thrombus. Normal compressibility. Venous Reflux:  None. Other Findings:  None. IMPRESSION: No evidence of acute or chronic DVT within either lower extremity. Electronically Signed   By: Sandi Mariscal M.D.   On: 03/04/2018 10:24   Dg Chest Port 1 View  Result Date: 03/03/2018 CLINICAL DATA:  Cough and fever EXAM: PORTABLE CHEST 1 VIEW COMPARISON:  Chest radiograph July 30, 2015 and chest  CT June 07, 2017 FINDINGS: There is no edema or consolidation. Heart is mildly enlarged with pulmonary vascularity normal. No adenopathy. There is degenerative change in each shoulder. IMPRESSION: Stable cardiac prominence.  No edema or consolidation. Electronically Signed   By: Lowella Grip III M.D.   On: 03/03/2018 10:32    Orson Eva, DO  [facility] Pager [phone]  If 7PM-7AM, please contact night-coverage [email or web address] Password [email or web address] 03/04/2018, 10:39 AM   LOS: 1 day

## 2018-03-05 LAB — BASIC METABOLIC PANEL
Anion gap: 8 (ref 5–15)
BUN: 29 mg/dL — ABNORMAL HIGH (ref 8–23)
CO2: 30 mmol/L (ref 22–32)
CREATININE: 1.05 mg/dL — AB (ref 0.44–1.00)
Calcium: 8.6 mg/dL — ABNORMAL LOW (ref 8.9–10.3)
Chloride: 102 mmol/L (ref 98–111)
GFR calc non Af Amer: 44 mL/min — ABNORMAL LOW (ref >60)
GFR, EST AFRICAN AMERICAN: 51 mL/min — AB (ref >60)
Glucose, Bld: 184 mg/dL — ABNORMAL HIGH (ref 70–99)
POTASSIUM: 4 mmol/L (ref 3.5–5.1)
SODIUM: 140 mmol/L (ref 135–145)

## 2018-03-05 LAB — TSH: TSH: 0.369 u[IU]/mL (ref 0.350–4.500)

## 2018-03-05 LAB — T4, FREE: Free T4: 0.96 ng/dL (ref 0.82–1.77)

## 2018-03-05 LAB — MAGNESIUM: MAGNESIUM: 2.2 mg/dL (ref 1.7–2.4)

## 2018-03-05 LAB — VITAMIN B12: Vitamin B-12: 605 pg/mL (ref 180–914)

## 2018-03-05 MED ORDER — MUPIROCIN 2 % EX OINT
1.0000 "application " | TOPICAL_OINTMENT | Freq: Two times a day (BID) | CUTANEOUS | Status: DC
  Administered 2018-03-05 – 2018-03-07 (×5): 1 via NASAL
  Filled 2018-03-05: qty 22

## 2018-03-05 MED ORDER — BUDESONIDE 0.5 MG/2ML IN SUSP
0.5000 mg | Freq: Two times a day (BID) | RESPIRATORY_TRACT | Status: DC
Start: 2018-03-05 — End: 2018-03-07
  Administered 2018-03-05 – 2018-03-07 (×5): 0.5 mg via RESPIRATORY_TRACT
  Filled 2018-03-05 (×5): qty 2

## 2018-03-05 MED ORDER — CHLORHEXIDINE GLUCONATE CLOTH 2 % EX PADS
6.0000 | MEDICATED_PAD | Freq: Every day | CUTANEOUS | Status: DC
  Administered 2018-03-05 – 2018-03-07 (×3): 6 via TOPICAL

## 2018-03-05 MED ORDER — FUROSEMIDE 10 MG/ML IJ SOLN
20.0000 mg | Freq: Once | INTRAMUSCULAR | Status: AC
  Administered 2018-03-05: 20 mg via INTRAVENOUS
  Filled 2018-03-05: qty 2

## 2018-03-05 MED ORDER — IPRATROPIUM-ALBUTEROL 0.5-2.5 (3) MG/3ML IN SOLN
3.0000 mL | Freq: Three times a day (TID) | RESPIRATORY_TRACT | Status: DC
  Administered 2018-03-05 – 2018-03-07 (×7): 3 mL via RESPIRATORY_TRACT
  Filled 2018-03-05 (×8): qty 3

## 2018-03-05 NOTE — Care Management Important Message (Signed)
Important Message  Patient Details  Name: [name] MRN: [id number] Date of Birth: 1922-07-16   Medicare Important Message Given:  Yes    Debbe Crumble, Chauncey Reading, RN 03/05/2018, 1:41 PM

## 2018-03-05 NOTE — Progress Notes (Signed)
PROGRESS NOTE  [name] YQM:[id number] DOB: [birth year] DOA: 03/03/2018 PCP: Wille Celeste, PA-C  Brief History:  84 y.o.femalewith medical history ofdiastolic CHF, CKD stage III, GERD, hyperlipidemia, hypertension, CAD presenting with 3-day history of fevers, chills, cough, and shortness of breath with her symptoms worsening the last 24 hours. The patient has been producing white sputum without hemoptysis. In addition, the patient was noted to have a new oxygen requirement at the [facility].As result, the patient was sent to the emergency department for further evaluation. She denies any nausea, vomiting, diarrhea, abdominal pain, dysuria, hematuria, headache, neck pain. In the emergency department, the patient was noted to have a temperature 100.6 F but was hemodynamically stable. She was noted to have oxygen saturation 88% on room air. BMP, LFTs, CBC were largely unremarkable. UA shows 21-50 WBC. Chest x-ray shows interstitial prominence without consolidations. Patient was started on ceftriaxone.    Assessment/Plan: Acute respiratory failure with hypoxia -Likely due to acute bronchitis -Stable on 2 L presently -Wean oxygen as tolerated for saturation greater than 92% -Check viral respiratory panel--positive rhino/enterovirus  Acute bronchitis with bronchospasm -Viral respiratory panel--positive rhino/enterovirus -continue duo nebs -Procalcitonin <0.10 -continue IV solumedrol -start pulmicort -7/6 CXR personally reviewed--interstitial prominence without consolidation  Generalized weakness -Likely due to infectious process -PT evaluation -B12--pending -TSH--0.369  UTI -received 3 days ceftriaxone  Elevated troponin -Likely due to demand ischemia -EKG sinus rhythm with nonspecific T wave changes -cycle troponins--0.03 x 3 -Echo--EF 65 to 70%, grade 1 DD, no WMA, mild AS, trivial MR/TR  CKD stage 3 -baseline creatinine 1.0-1.3 -am  BMP  Essential hypertension -Holding amlodipine and HCTZ secondary to soft blood pressure initially -continue amlodipine  Leg pain and edema -Venous duplex rule out DVT--neg -lasix IV x 1  Hyperlipidemia -continue statin   Disposition Plan:   SNF 7/9 if stable  Family Communication:  No Family at bedside  Consultants:  none  Code Status:  DNR  DVT Prophylaxis:  [place] Lovenox   Procedures: As Listed in Progress Note Above  Antibiotics: ceftiraxone 7/6>>>7/8       Subjective: Patient is breathing better overall but continues to have a cough.  She still remains dyspneic with exertion.  She has no wheezing.  She is nonproductive cough.  She denies any nausea, vomiting, diarrhea, abdominal pain, dysuria, hematuria.  Objective: Vitals:   03/04/18 1940 03/04/18 2213 03/05/18 0609 03/05/18 0753  BP:  (!) 169/66 (!) 173/65   Pulse: (!) 57 63 (!) 59   Resp: 16 16 14    Temp:  98.9 F (37.2 C) 97.7 F (36.5 C)   TempSrc:  Oral Oral   SpO2: 97% 100% 100% 98%  Weight:   78.3 kg (172 lb 9.9 oz)   Height:        Intake/Output Summary (Last 24 hours) at 03/05/2018 1020 Last data filed at 03/05/2018 0930 Gross per 24 hour  Intake 680 ml  Output 850 ml  Net -170 ml   Weight change: -1.08 kg (-2 lb 6.1 oz) Exam:   General:  Pt is alert, follows commands appropriately, not in acute distress  HEENT: No icterus, No thrush, No neck mass, [place]/AT  Cardiovascular: RRR, S1/S2, no rubs, no gallops  Respiratory: Bibasilar rales.  Bibasilar expiratory wheeze.  Good air movement.  Abdomen: Soft/+BS, non tender, non distended, no guarding  Extremities: 1+LE edema, No lymphangitis, No petechiae, No rashes, no synovitis   Data Reviewed: I have personally  reviewed following labs and imaging studies Basic Metabolic Panel: Recent Labs  Lab 03/03/18 1008 03/04/18 0703 03/05/18 0515  NA 140 143 140  K 3.8 3.5 4.0  CL 102 104 102  CO2 30 32 30  GLUCOSE 118* 84 184*   BUN 34* 28* 29*  CREATININE 1.16* 1.10* 1.05*  CALCIUM 8.8* 8.8* 8.6*  MG  --   --  2.2   Liver Function Tests: Recent Labs  Lab 03/03/18 1008  AST 19  ALT 12  ALKPHOS 65  BILITOT 0.7  PROT 7.0  ALBUMIN 3.5   No results for input(s): LIPASE, AMYLASE in the last 168 hours. No results for input(s): AMMONIA in the last 168 hours. Coagulation Profile: No results for input(s): INR, PROTIME in the last 168 hours. CBC: Recent Labs  Lab 03/03/18 1008 03/04/18 0703  WBC 5.3 3.4*  NEUTROABS 4.1  --   HGB 10.2* 9.8*  HCT 31.9* 31.7*  MCV 85.5 86.1  PLT 174 166   Cardiac Enzymes: Recent Labs  Lab 03/03/18 1008 03/03/18 1530 03/03/18 2127  TROPONINI 0.03* 0.03* 0.03*   BNP: Invalid input(s): POCBNP CBG: No results for input(s): GLUCAP in the last 168 hours. HbA1C: No results for input(s): HGBA1C in the last 72 hours. Urine analysis:    Component Value Date/Time   COLORURINE YELLOW 03/03/2018 1200   APPEARANCEUR HAZY (A) 03/03/2018 1200   LABSPEC 1.018 03/03/2018 1200   PHURINE 6.0 03/03/2018 1200   GLUCOSEU NEGATIVE 03/03/2018 1200   GLUCOSEU NEG mg/dL 05/21/2009 2149   HGBUR NEGATIVE 03/03/2018 1200   BILIRUBINUR NEGATIVE 03/03/2018 1200   KETONESUR NEGATIVE 03/03/2018 1200   PROTEINUR NEGATIVE 03/03/2018 1200   UROBILINOGEN 1 05/21/2009 2149   NITRITE POSITIVE (A) 03/03/2018 1200   LEUKOCYTESUR LARGE (A) 03/03/2018 1200   Sepsis Labs: @LABRCNTIP (procalcitonin:4,lacticidven:4) ) Recent Results (from the past 240 hour(s))  Blood Culture (routine x 2)     Status: None (Preliminary result)   Collection Time: 03/03/18 10:10 AM  Result Value Ref Range Status   Specimen Description   Final    BLOOD RIGHT FOREARM BOTTLES DRAWN AEROBIC AND ANAEROBIC   Special Requests   Final    Blood Culture results may not be optimal due to an excessive volume of blood received in culture bottles   Culture   Final    NO GROWTH 2 DAYS Performed at [facility], [address] [place], [place] [address]    Report Status PENDING  Incomplete  Blood Culture (routine x 2)     Status: None (Preliminary result)   Collection Time: 03/03/18 10:14 AM  Result Value Ref Range Status   Specimen Description   Final    BLOOD LEFT HAND BOTTLES DRAWN AEROBIC AND ANAEROBIC   Special Requests Blood Culture adequate volume  Final   Culture   Final    NO GROWTH 2 DAYS Performed at [facility], [address], [place], [place] [address]    Report Status PENDING  Incomplete  Urine culture     Status: None   Collection Time: 03/03/18 12:00 PM  Result Value Ref Range Status   Specimen Description   Final    URINE, CLEAN CATCH Performed at [facility], [address], [place], [place] [address]    Special Requests   Final    NONE Performed at [facility], [address] [place], [place] [address]    Culture   Final    NO GROWTH Performed at [facility], [address] [place], [place] [address]    Report Status 03/04/2018 FINAL  Final  Respiratory Panel by PCR     Status: Abnormal   Collection Time: 03/03/18  9:39 PM  Result Value Ref Range Status   Adenovirus NOT DETECTED NOT DETECTED Final   Coronavirus 229E NOT DETECTED NOT DETECTED Final   Coronavirus HKU1 NOT DETECTED NOT DETECTED Final   Coronavirus NL63 NOT DETECTED NOT DETECTED Final   Coronavirus OC43 NOT DETECTED NOT DETECTED Final   Metapneumovirus NOT DETECTED NOT DETECTED Final   Rhinovirus / Enterovirus DETECTED (A) NOT DETECTED Final   Influenza A NOT DETECTED NOT DETECTED Final   Influenza B NOT DETECTED NOT DETECTED Final   Parainfluenza Virus 1 NOT DETECTED NOT DETECTED Final   Parainfluenza Virus 2 NOT DETECTED NOT DETECTED Final   Parainfluenza Virus 3 NOT DETECTED NOT DETECTED Final   Parainfluenza Virus 4 NOT DETECTED NOT DETECTED Final   Respiratory Syncytial Virus NOT DETECTED NOT DETECTED Final   Bordetella pertussis NOT DETECTED NOT DETECTED Final   Chlamydophila pneumoniae NOT  DETECTED NOT DETECTED Final   Mycoplasma pneumoniae NOT DETECTED NOT DETECTED Final    Comment: Performed at [facility], [place] [address], [place], [place] [address]  MRSA PCR Screening     Status: Abnormal   Collection Time: 03/04/18  5:06 AM  Result Value Ref Range Status   MRSA by PCR POSITIVE (A) NEGATIVE Final    Comment:        The GeneXpert MRSA Assay (FDA approved for NASAL specimens only), is one component of a comprehensive MRSA colonization surveillance program. It is not intended to diagnose MRSA infection nor to guide or monitor treatment for MRSA infections. RESULT CALLED TO, READ BACK BY AND VERIFIED WITH: [email or web address]  BY MATTHEWS, B 7.7.19 Performed at [facility], [address] [place], [place] [address]      Scheduled Meds: . amLODipine  5 mg Oral Daily  . brimonidine  1 drop Both Eyes TID  . brinzolamide  1 drop Both Eyes TID  . calcium-vitamin D  1 tablet Oral BID  . Chlorhexidine Gluconate Cloth  6 each Topical Q0600  . citalopram  20 mg Oral Daily  . cycloSPORINE  1 drop Both Eyes BID  . docusate sodium  100 mg Oral BID  . donepezil  5 mg Oral Daily  . enoxaparin (LOVENOX) injection  30 mg Subcutaneous Q24H  . furosemide  20 mg Intravenous Once  . ipratropium-albuterol  3 mL Nebulization TID  . latanoprost  1 drop Both Eyes QHS  . loratadine  10 mg Oral Daily  . LORazepam  0.5 mg Oral BID  . methylPREDNISolone (SOLU-MEDROL) injection  60 mg Intravenous Q12H  . mupirocin ointment  1 application Nasal BID  . pantoprazole  40 mg Oral Daily  . pravastatin  40 mg Oral q1800   Continuous Infusions: . cefTRIAXone (ROCEPHIN)  IV Stopped (03/04/18 1215)    Procedures/Studies: US Venous Img Lower Bilateral  Result Date: 03/04/2018 CLINICAL DATA:  Bilateral lower extremity pain. Shortness of breath for 3 days. History of prior DVT. Evaluate for acute or chronic DVT. EXAM: BILATERAL LOWER EXTREMITY VENOUS DOPPLER ULTRASOUND TECHNIQUE: Gray-scale  sonography with graded compression, as well as color Doppler and duplex ultrasound were performed to evaluate the lower extremity deep venous systems from the level of the common femoral vein and including the common femoral, femoral, profunda femoral, popliteal and calf veins including the posterior tibial, peroneal and gastrocnemius veins  when visible. The superficial great saphenous vein was also interrogated. Spectral Doppler was utilized to evaluate flow at rest and with distal augmentation maneuvers in the common femoral, femoral and popliteal veins. COMPARISON:  None. FINDINGS: RIGHT LOWER EXTREMITY Common Femoral Vein: No evidence of thrombus. Normal compressibility, respiratory phasicity and response to augmentation. Saphenofemoral Junction: No evidence of thrombus. Normal compressibility and flow on color Doppler imaging. Profunda Femoral Vein: No evidence of thrombus. Normal compressibility and flow on color Doppler imaging. Femoral Vein: No evidence of thrombus. Normal compressibility, respiratory phasicity and response to augmentation. Popliteal Vein: No evidence of thrombus. Normal compressibility, respiratory phasicity and response to augmentation. Calf Veins: No evidence of thrombus. Normal compressibility and flow on color Doppler imaging. Superficial Great Saphenous Vein: No evidence of thrombus. Normal compressibility. Venous Reflux:  None. Other Findings:  None. LEFT LOWER EXTREMITY Common Femoral Vein: No evidence of thrombus. Normal compressibility, respiratory phasicity and response to augmentation. Saphenofemoral Junction: No evidence of thrombus. Normal compressibility and flow on color Doppler imaging. Profunda Femoral Vein: No evidence of thrombus. Normal compressibility and flow on color Doppler imaging. Femoral Vein: No evidence of thrombus. Normal compressibility, respiratory phasicity and response to augmentation. Popliteal Vein: No evidence of thrombus. Normal compressibility,  respiratory phasicity and response to augmentation. Calf Veins: No evidence of thrombus. Normal compressibility and flow on color Doppler imaging. Superficial Great Saphenous Vein: No evidence of thrombus. Normal compressibility. Venous Reflux:  None. Other Findings:  None. IMPRESSION: No evidence of acute or chronic DVT within either lower extremity. Electronically Signed   By: Sandi Mariscal M.D.   On: 03/04/2018 10:24   Dg Chest Port 1 View  Result Date: 03/03/2018 CLINICAL DATA:  Cough and fever EXAM: PORTABLE CHEST 1 VIEW COMPARISON:  Chest radiograph July 30, 2015 and chest CT June 07, 2017 FINDINGS: There is no edema or consolidation. Heart is mildly enlarged with pulmonary vascularity normal. No adenopathy. There is degenerative change in each shoulder. IMPRESSION: Stable cardiac prominence.  No edema or consolidation. Electronically Signed   By: Lowella Grip III M.D.   On: 03/03/2018 10:32    Orson Eva, DO  [facility] Pager [phone]  If 7PM-7AM, please contact night-coverage [email or web address] Password [email or web address] 03/05/2018, 10:20 AM   LOS: 2 days

## 2018-03-06 LAB — BASIC METABOLIC PANEL
Anion gap: 8 (ref 5–15)
BUN: 31 mg/dL — AB (ref 8–23)
CHLORIDE: 99 mmol/L (ref 98–111)
CO2: 32 mmol/L (ref 22–32)
Calcium: 8.8 mg/dL — ABNORMAL LOW (ref 8.9–10.3)
Creatinine, Ser: 0.95 mg/dL (ref 0.44–1.00)
GFR calc Af Amer: 58 mL/min — ABNORMAL LOW (ref >60)
GFR calc non Af Amer: 50 mL/min — ABNORMAL LOW (ref >60)
GLUCOSE: 135 mg/dL — AB (ref 70–99)
Potassium: 3.8 mmol/L (ref 3.5–5.1)
Sodium: 139 mmol/L (ref 135–145)

## 2018-03-06 MED ORDER — PREDNISONE 20 MG PO TABS
60.0000 mg | ORAL_TABLET | Freq: Every day | ORAL | Status: DC
  Administered 2018-03-07: 60 mg via ORAL
  Filled 2018-03-06: qty 3

## 2018-03-06 MED ORDER — ARFORMOTEROL TARTRATE 15 MCG/2ML IN NEBU
15.0000 ug | INHALATION_SOLUTION | Freq: Two times a day (BID) | RESPIRATORY_TRACT | Status: DC
  Administered 2018-03-06 – 2018-03-07 (×2): 15 ug via RESPIRATORY_TRACT
  Filled 2018-03-06 (×2): qty 2

## 2018-03-06 MED ORDER — FUROSEMIDE 10 MG/ML IJ SOLN
20.0000 mg | Freq: Once | INTRAMUSCULAR | Status: AC
  Administered 2018-03-06: 20 mg via INTRAVENOUS
  Filled 2018-03-06: qty 2

## 2018-03-06 MED ORDER — METHYLPREDNISOLONE SODIUM SUCC 125 MG IJ SOLR
60.0000 mg | Freq: Two times a day (BID) | INTRAMUSCULAR | Status: AC
  Administered 2018-03-06: 60 mg via INTRAVENOUS
  Filled 2018-03-06: qty 2

## 2018-03-06 NOTE — Progress Notes (Signed)
PROGRESS NOTE  [name]:[id number] DOB: 02-19-1922 DOA: 03/03/2018 PCP: Wille Celeste, PA-C  Brief History: 84 y.o.femalewith medical history ofdiastolic CHF, CKD stage III, GERD, hyperlipidemia, hypertension, CAD presenting with 3-day history of fevers, chills, cough, and shortness of breath with her symptoms worsening the last 24 hours. The patient has been producing white sputum without hemoptysis. In addition, the patient was noted to have a new oxygen requirement at the [facility].As result, the patient was sent to the emergency department for further evaluation. She denies any nausea, vomiting, diarrhea, abdominal pain, dysuria, hematuria, headache, neck pain. In the emergency department, the patient was noted to have a temperature 100.6 F but was hemodynamically stable. She was noted to have oxygen saturation 88% on room air. BMP, LFTs, CBC were largely unremarkable. UA shows 21-50 WBC. Chest x-ray shows interstitial prominence without consolidations. Patient was started on ceftriaxone x 3 days. The patient continued to have significant bronchospasm and wheezing which gradually improved with IV Solu-Medrol and 2 doses of IV furosemide.   Assessment/Plan: Acute respiratory failure with hypoxia -Likely due to acute bronchitis -Stable on 2 L presently -Wean oxygen as tolerated for saturation greater than 92% -Check viral respiratory panel--positive rhino/enterovirus  Acute bronchitiswith bronchospasm -Viral respiratory panel--positive rhino/enterovirus -continueduo nebs -Procalcitonin<0.10 -continue IV solumedrol>>>po prednisone on 7/10 -started pulmicort -7/6 CXRpersonally reviewed--interstitial prominence without consolidation  Generalized weakness -Likely due to infectious process -PT evaluation -B12--605 -TSH--0.369  Pyuria -received 3 days ceftriaxone  Elevated troponin -due to demand ischemia -EKG sinus rhythm with  nonspecific T wave changes -cycle troponins--0.03 x 3 -Echo--EF 65 to 70%, grade 1 DD, no WMA, mild AS, trivial MR/TR  CKD stage 3 -baseline creatinine 1.0-1.3 -am BMP  Essential hypertension -Holding amlodipine and HCTZ secondary to soft blood pressureinitially -continue amlodipine  Leg pain and edema -Venous duplex rule out DVT--neg -lasix 20 mg IV on 7/8 and 7/9  Hyperlipidemia -continue statin   Disposition [place] 7/10 if stable  Family Communication:NoFamily at bedside  Consultants:none  Code Status: DNR  DVT Prophylaxis: [place] Lovenox   Procedures: As Listed in Progress Note Above  Antibiotics: ceftiraxone 7/6>>>7/8         Subjective: Patient overall is breathing better but she had continued wheezing today with dyspnea on exertion.  She denied any chest pain, nausea, vomiting, diarrhea, abdominal pain, headache, neck pain.  Objective: Vitals:   03/06/18 0931 03/06/18 0934 03/06/18 1359 03/06/18 1505  BP:   (!) 138/54   Pulse:   (!) 56   Resp:   18   Temp:   98.3 F (36.8 C)   TempSrc:   Oral   SpO2: 99% 100% 97% (!) 88%  Weight:      Height:        Intake/Output Summary (Last 24 hours) at 03/06/2018 1807 Last data filed at 03/06/2018 0900 Gross per 24 hour  Intake 360 ml  Output 300 ml  Net 60 ml   Weight change: -1.5 kg (-3 lb 4.9 oz) Exam:   General:  Pt is alert, follows commands appropriately, not in acute distress  HEENT: No icterus, No thrush, No neck mass, [place]/AT  Cardiovascular: RRR, S1/S2, no rubs, no gallops  Respiratory: Bibasilar rales.  Mild bibasilar wheeze.  Good air movement.  Abdomen: Soft/+BS, non tender, non distended, no guarding  Extremities: 1 + LE edema, No lymphangitis, No petechiae, No rashes, no synovitis   Data Reviewed: I have personally reviewed  following labs and imaging studies Basic Metabolic Panel: Recent Labs  Lab 03/03/18 1008 03/04/18 0703 03/05/18 0515  03/06/18 0445  NA 140 143 140 139  K 3.8 3.5 4.0 3.8  CL 102 104 102 99  CO2 30 32 30 32  GLUCOSE 118* 84 184* 135*  BUN 34* 28* 29* 31*  CREATININE 1.16* 1.10* 1.05* 0.95  CALCIUM 8.8* 8.8* 8.6* 8.8*  MG  --   --  2.2  --    Liver Function Tests: Recent Labs  Lab 03/03/18 1008  AST 19  ALT 12  ALKPHOS 65  BILITOT 0.7  PROT 7.0  ALBUMIN 3.5   No results for input(s): LIPASE, AMYLASE in the last 168 hours. No results for input(s): AMMONIA in the last 168 hours. Coagulation Profile: No results for input(s): INR, PROTIME in the last 168 hours. CBC: Recent Labs  Lab 03/03/18 1008 03/04/18 0703  WBC 5.3 3.4*  NEUTROABS 4.1  --   HGB 10.2* 9.8*  HCT 31.9* 31.7*  MCV 85.5 86.1  PLT 174 166   Cardiac Enzymes: Recent Labs  Lab 03/03/18 1008 03/03/18 1530 03/03/18 2127  TROPONINI 0.03* 0.03* 0.03*   BNP: Invalid input(s): POCBNP CBG: No results for input(s): GLUCAP in the last 168 hours. HbA1C: No results for input(s): HGBA1C in the last 72 hours. Urine analysis:    Component Value Date/Time   COLORURINE YELLOW 03/03/2018 1200   APPEARANCEUR HAZY (A) 03/03/2018 1200   LABSPEC 1.018 03/03/2018 1200   PHURINE 6.0 03/03/2018 1200   GLUCOSEU NEGATIVE 03/03/2018 1200   GLUCOSEU NEG mg/dL 05/21/2009 2149   HGBUR NEGATIVE 03/03/2018 1200   BILIRUBINUR NEGATIVE 03/03/2018 1200   KETONESUR NEGATIVE 03/03/2018 1200   PROTEINUR NEGATIVE 03/03/2018 1200   UROBILINOGEN 1 05/21/2009 2149   NITRITE POSITIVE (A) 03/03/2018 1200   LEUKOCYTESUR LARGE (A) 03/03/2018 1200   Sepsis Labs: @LABRCNTIP (procalcitonin:4,lacticidven:4) ) Recent Results (from the past 240 hour(s))  Blood Culture (routine x 2)     Status: None (Preliminary result)   Collection Time: 03/03/18 10:10 AM  Result Value Ref Range Status   Specimen Description   Final    BLOOD RIGHT FOREARM BOTTLES DRAWN AEROBIC AND ANAEROBIC   Special Requests   Final    Blood Culture results may not be optimal due  to an excessive volume of blood received in culture bottles   Culture   Final    NO GROWTH 3 DAYS Performed at [facility], [address], [place], [place] [address]    Report Status PENDING  Incomplete  Blood Culture (routine x 2)     Status: None (Preliminary result)   Collection Time: 03/03/18 10:14 AM  Result Value Ref Range Status   Specimen Description   Final    BLOOD LEFT HAND BOTTLES DRAWN AEROBIC AND ANAEROBIC   Special Requests Blood Culture adequate volume  Final   Culture   Final    NO GROWTH 3 DAYS Performed at [facility], [address], [place], [place] [address]    Report Status PENDING  Incomplete  Urine culture     Status: None   Collection Time: 03/03/18 12:00 PM  Result Value Ref Range Status   Specimen Description   Final    URINE, CLEAN CATCH Performed at [facility], [address], [place], [place] [address]    Special Requests   Final    NONE Performed at [facility], [address] [place],  [address]    Culture   Final  NO GROWTH Performed at [facility], [place] [address] [place], [place] [address]    Report Status 03/04/2018 FINAL  Final  Respiratory Panel by PCR     Status: Abnormal   Collection Time: 03/03/18  9:39 PM  Result Value Ref Range Status   Adenovirus NOT DETECTED NOT DETECTED Final   Coronavirus 229E NOT DETECTED NOT DETECTED Final   Coronavirus HKU1 NOT DETECTED NOT DETECTED Final   Coronavirus NL63 NOT DETECTED NOT DETECTED Final   Coronavirus OC43 NOT DETECTED NOT DETECTED Final   Metapneumovirus NOT DETECTED NOT DETECTED Final   Rhinovirus / Enterovirus DETECTED (A) NOT DETECTED Final   Influenza A NOT DETECTED NOT DETECTED Final   Influenza B NOT DETECTED NOT DETECTED Final   Parainfluenza Virus 1 NOT DETECTED NOT DETECTED Final   Parainfluenza Virus 2 NOT DETECTED NOT DETECTED Final   Parainfluenza Virus 3 NOT DETECTED NOT DETECTED Final   Parainfluenza Virus 4 NOT DETECTED NOT DETECTED Final    Respiratory Syncytial Virus NOT DETECTED NOT DETECTED Final   Bordetella pertussis NOT DETECTED NOT DETECTED Final   Chlamydophila pneumoniae NOT DETECTED NOT DETECTED Final   Mycoplasma pneumoniae NOT DETECTED NOT DETECTED Final    Comment: Performed at [facility], [place] [address], [place], [place] [address]  MRSA PCR Screening     Status: Abnormal   Collection Time: 03/04/18  5:06 AM  Result Value Ref Range Status   MRSA by PCR POSITIVE (A) NEGATIVE Final    Comment:        The GeneXpert MRSA Assay (FDA approved for NASAL specimens only), is one component of a comprehensive MRSA colonization surveillance program. It is not intended to diagnose MRSA infection nor to guide or monitor treatment for MRSA infections. RESULT CALLED TO, READ BACK BY AND VERIFIED WITH: TAYLOR,[email or web address]  BY MATTHEWS, B 7.7.19 Performed at [facility], [address], [place], [place] [address]      Scheduled Meds: . amLODipine  5 mg Oral Daily  . arformoterol  15 mcg Nebulization BID  . brimonidine  1 drop Both Eyes TID  . brinzolamide  1 drop Both Eyes TID  . budesonide (PULMICORT) nebulizer solution  0.5 mg Nebulization BID  . calcium-vitamin D  1 tablet Oral BID  . Chlorhexidine Gluconate Cloth  6 each Topical Q0600  . citalopram  20 mg Oral Daily  . cycloSPORINE  1 drop Both Eyes BID  . docusate sodium  100 mg Oral BID  . donepezil  5 mg Oral Daily  . enoxaparin (LOVENOX) injection  30 mg Subcutaneous Q24H  . ipratropium-albuterol  3 mL Nebulization TID  . latanoprost  1 drop Both Eyes QHS  . loratadine  10 mg Oral Daily  . LORazepam  0.5 mg Oral BID  . methylPREDNISolone (SOLU-MEDROL) injection  60 mg Intravenous Q12H  . mupirocin ointment  1 application Nasal BID  . pantoprazole  40 mg Oral Daily  . pravastatin  40 mg Oral q1800  . [START ON 03/14/2018] predniSONE  60 mg Oral Q breakfast   Continuous Infusions:  Procedures/Studies: US Venous Img Lower Bilateral  Result Date:  03/04/2018 CLINICAL DATA:  Bilateral lower extremity pain. Shortness of breath for 3 days. History of prior DVT. Evaluate for acute or chronic DVT. EXAM: BILATERAL LOWER EXTREMITY VENOUS DOPPLER ULTRASOUND TECHNIQUE: Gray-scale sonography with graded compression, as well as color Doppler and duplex ultrasound were performed to evaluate the lower extremity deep venous systems from the level of the common  femoral vein and including the common femoral, femoral, profunda femoral, popliteal and calf veins including the posterior tibial, peroneal and gastrocnemius veins when visible. The superficial great saphenous vein was also interrogated. Spectral Doppler was utilized to evaluate flow at rest and with distal augmentation maneuvers in the common femoral, femoral and popliteal veins. COMPARISON:  None. FINDINGS: RIGHT LOWER EXTREMITY Common Femoral Vein: No evidence of thrombus. Normal compressibility, respiratory phasicity and response to augmentation. Saphenofemoral Junction: No evidence of thrombus. Normal compressibility and flow on color Doppler imaging. Profunda Femoral Vein: No evidence of thrombus. Normal compressibility and flow on color Doppler imaging. Femoral Vein: No evidence of thrombus. Normal compressibility, respiratory phasicity and response to augmentation. Popliteal Vein: No evidence of thrombus. Normal compressibility, respiratory phasicity and response to augmentation. Calf Veins: No evidence of thrombus. Normal compressibility and flow on color Doppler imaging. Superficial Great Saphenous Vein: No evidence of thrombus. Normal compressibility. Venous Reflux:  None. Other Findings:  None. LEFT LOWER EXTREMITY Common Femoral Vein: No evidence of thrombus. Normal compressibility, respiratory phasicity and response to augmentation. Saphenofemoral Junction: No evidence of thrombus. Normal compressibility and flow on color Doppler imaging. Profunda Femoral Vein: No evidence of thrombus. Normal  compressibility and flow on color Doppler imaging. Femoral Vein: No evidence of thrombus. Normal compressibility, respiratory phasicity and response to augmentation. Popliteal Vein: No evidence of thrombus. Normal compressibility, respiratory phasicity and response to augmentation. Calf Veins: No evidence of thrombus. Normal compressibility and flow on color Doppler imaging. Superficial Great Saphenous Vein: No evidence of thrombus. Normal compressibility. Venous Reflux:  None. Other Findings:  None. IMPRESSION: No evidence of acute or chronic DVT within either lower extremity. Electronically Signed   By: Sandi Mariscal M.D.   On: 03/04/2018 10:24   Dg Chest Port 1 View  Result Date: 03/03/2018 CLINICAL DATA:  Cough and fever EXAM: PORTABLE CHEST 1 VIEW COMPARISON:  Chest radiograph July 30, 2015 and chest CT June 07, 2017 FINDINGS: There is no edema or consolidation. Heart is mildly enlarged with pulmonary vascularity normal. No adenopathy. There is degenerative change in each shoulder. IMPRESSION: Stable cardiac prominence.  No edema or consolidation. Electronically Signed   By: Lowella Grip III M.D.   On: 03/03/2018 10:32    Orson Eva, DO  [facility] Pager [phone]  If 7PM-7AM, please contact night-coverage [email or web address] Password [email or web address] 03/06/2018, 6:07 PM   LOS: 3 days

## 2018-03-06 NOTE — Clinical Social Work Note (Signed)
LCSW spoke with [name] at [facility]. Patient is a long term care resident at [facility]. She receives assistance with staff for ADLs.  She uses a Corporate treasurer. Patient can return to the facility at discharge.   Elajah Kunsman, Clydene Pugh, LCSW

## 2018-03-06 NOTE — NC FL2 (Signed)
[place] LEVEL OF CARE SCREENING TOOL     IDENTIFICATION  Patient Name: [name] Birthdate: 17-Jun-1922 Sex: female Admission Date (Current Location): 03/03/2018  [facility] and [place] Number:  [employer] and Address:  [place] [address] [place]      Provider Number: [id number]  Attending Physician Name and Address:  Orson Eva, MD  Relative Name and Phone Number:       Current Level of Care: Hospital Recommended Level of Care: [place] Prior Approval Number:    Date Approved/Denied:   PASRR Number:    Discharge Plan: SNF    Current Diagnoses: Patient Active Problem List   Diagnosis Date Noted  . Urinary tract infection without hematuria   . Acute respiratory failure ([place]) 03/03/2018  . Acute bronchitis 03/03/2018  . Elevated troponin 03/03/2018  . Chest pain 03/03/2018  . CKD (chronic kidney disease) stage 3, GFR 30-59 ml/min (HCC) 03/03/2018  . Chronic diastolic CHF (congestive heart failure) ([place]) 03/03/2018  . Fever and chills   . Anemia 08/09/2017  . Diastolic CHF ([place]) 93/71/6967  . Chronic renal disease 06/21/2016  . Hyperglycemia 12/31/2015  . DVT (deep venous thrombosis) ([place]) 05/22/2015  . GERD 05/04/2010  . GOUT, UNSPECIFIED 01/12/2010  . Depression 07/05/2009  . CALLUSES, FEET, BILATERAL 05/21/2009  . Allergic rhinitis 12/21/2008  . Hyperlipidemia 11/07/2007  . Dementia with behavioral disturbance 11/07/2007  . Anxiety state 11/07/2007  . Essential hypertension 11/07/2007  . OSTEOARTHRITIS 11/07/2007    Orientation RESPIRATION BLADDER Height & Weight     Self, Time, Situation, Place  O2(2L) Incontinent Weight: 169 lb 5 oz (76.8 kg) Height:  4\' 11"  (149.9 cm)  BEHAVIORAL SYMPTOMS/MOOD NEUROLOGICAL BOWEL NUTRITION STATUS      Incontinent (heart healthy)  AMBULATORY STATUS COMMUNICATION OF NEEDS Skin   Extensive Assist Verbally Normal                        Personal Care Assistance Level of Assistance  Bathing, Feeding, Dressing Bathing Assistance: Limited assistance Feeding assistance: Independent Dressing Assistance: Limited assistance     Functional Limitations Info  Sight, Hearing, Speech Sight Info: Adequate Hearing Info: Adequate Speech Info: Adequate    SPECIAL CARE FACTORS FREQUENCY                       Contractures Contractures Info: Not present    Additional Factors Info  Code Status, Allergies, Psychotropic Code Status Info: DNR Allergies Info: Bee Venom, Angiotensin Receptor Blockers, Aspirin, Cabbage, Latex Psychotropic Info: Celexa         Current Medications (03/06/2018):  This is the current hospital active medication list Current Facility-Administered Medications  Medication Dose Route Frequency Provider Last Rate Last Dose  . acetaminophen (TYLENOL) tablet 650 mg  650 mg Oral Q6H PRN Tat, Shanon Brow, MD   650 mg at 03/06/18 8938   Or  . acetaminophen (TYLENOL) suppository 650 mg  650 mg Rectal Q6H PRN Tat, Shanon Brow, MD      . amLODipine (NORVASC) tablet 5 mg  5 mg Oral Daily Tat, David, MD   5 mg at 03/06/18 0951  . arformoterol (BROVANA) nebulizer solution 15 mcg  15 mcg Nebulization BID Tat, David, MD      . brimonidine (ALPHAGAN) 0.2 % ophthalmic solution 1 drop  1 drop Both Eyes TID Tat, David, MD   1 drop at 03/06/18 1609  . brinzolamide (AZOPT)  1 % ophthalmic suspension 1 drop  1 drop Both Eyes TID Tat, David, MD   1 drop at 03/06/18 1609  . budesonide (PULMICORT) nebulizer solution 0.5 mg  0.5 mg Nebulization BID Tat, David, MD   0.5 mg at 03/06/18 0933  . calcium-vitamin D (OSCAL WITH D) 500-200 MG-UNIT per tablet 1 tablet  1 tablet Oral BID Orson Eva, MD   1 tablet at 03/06/18 0954  . Chlorhexidine Gluconate Cloth 2 % PADS 6 each  6 each Topical Q0600 Tat, David, MD   6 each at 03/06/18 0600  . citalopram (CELEXA) tablet 20 mg  20 mg Oral Daily Tat, David, MD   20 mg at 03/06/18 0952  .  cycloSPORINE (RESTASIS) 0.05 % ophthalmic emulsion 1 drop  1 drop Both Eyes BID Tat, David, MD   1 drop at 03/06/18 0955  . docusate sodium (COLACE) capsule 100 mg  100 mg Oral BID Orson Eva, MD   100 mg at 03/06/18 0954  . donepezil (ARICEPT) tablet 5 mg  5 mg Oral Daily Tat, David, MD   5 mg at 03/06/18 0953  . enoxaparin (LOVENOX) injection 30 mg  30 mg Subcutaneous Q24H Tat, Shanon Brow, MD   30 mg at 03/05/18 1811  . ipratropium-albuterol (DUONEB) 0.5-2.5 (3) MG/3ML nebulizer solution 3 mL  3 mL Nebulization TID Tat, Shanon Brow, MD   3 mL at 03/06/18 1503  . latanoprost (XALATAN) 0.005 % ophthalmic solution 1 drop  1 drop Both Eyes QHS Orson Eva, MD   1 drop at 03/05/18 2249  . loratadine (CLARITIN) tablet 10 mg  10 mg Oral Daily Tat, David, MD   10 mg at 03/06/18 0955  . LORazepam (ATIVAN) tablet 0.5 mg  0.5 mg Oral BID Tat, Shanon Brow, MD   0.5 mg at 03/06/18 0953  . methylPREDNISolone sodium succinate (SOLU-MEDROL) 125 mg/2 mL injection 60 mg  60 mg Intravenous Q12H Tat, Shanon Brow, MD      . mupirocin ointment (BACTROBAN) 2 % 1 application  1 application Nasal BID Orson Eva, MD   1 application at 25/49/82 [phone]  . ondansetron (ZOFRAN) tablet 4 mg  4 mg Oral Q6H PRN Tat, David, MD       Or  . ondansetron (ZOFRAN) injection 4 mg  4 mg Intravenous Q6H PRN Tat, David, MD      . pantoprazole (PROTONIX) EC tablet 40 mg  40 mg Oral Daily Tat, David, MD   40 mg at 03/06/18 0952  . pravastatin (PRAVACHOL) tablet 40 mg  40 mg Oral q1800 Orson Eva, MD   40 mg at 03/05/18 1810  . [START ON 03/01/2018] predniSONE (DELTASONE) tablet 60 mg  60 mg Oral Q breakfast Tat, Shanon Brow, MD         Discharge Medications: Please see discharge summary for a list of discharge medications.  Relevant Imaging Results:  Relevant Lab Results:   Additional Information    Teagon Kron, Clydene Pugh, LCSW

## 2018-03-07 ENCOUNTER — Inpatient Hospital Stay
Admission: RE | Admit: 2018-03-07 | Discharge: 2021-07-29 | Disposition: E | Payer: Medicare Other | Source: Ambulatory Visit | Attending: Internal Medicine | Admitting: Internal Medicine

## 2018-03-07 ENCOUNTER — Encounter: Payer: Self-pay | Admitting: Internal Medicine

## 2018-03-07 ENCOUNTER — Non-Acute Institutional Stay (SKILLED_NURSING_FACILITY): Payer: Medicare Other | Admitting: Internal Medicine

## 2018-03-07 DIAGNOSIS — R279 Unspecified lack of coordination: Secondary | ICD-10-CM | POA: Diagnosis not present

## 2018-03-07 DIAGNOSIS — R062 Wheezing: Principal | ICD-10-CM

## 2018-03-07 DIAGNOSIS — I739 Peripheral vascular disease, unspecified: Secondary | ICD-10-CM | POA: Diagnosis not present

## 2018-03-07 DIAGNOSIS — F0390 Unspecified dementia without behavioral disturbance: Secondary | ICD-10-CM | POA: Diagnosis not present

## 2018-03-07 DIAGNOSIS — R609 Edema, unspecified: Secondary | ICD-10-CM | POA: Diagnosis not present

## 2018-03-07 DIAGNOSIS — R52 Pain, unspecified: Secondary | ICD-10-CM

## 2018-03-07 DIAGNOSIS — R778 Other specified abnormalities of plasma proteins: Secondary | ICD-10-CM

## 2018-03-07 DIAGNOSIS — F411 Generalized anxiety disorder: Secondary | ICD-10-CM

## 2018-03-07 DIAGNOSIS — R1312 Dysphagia, oropharyngeal phase: Secondary | ICD-10-CM | POA: Diagnosis not present

## 2018-03-07 DIAGNOSIS — F428 Other obsessive-compulsive disorder: Secondary | ICD-10-CM | POA: Diagnosis not present

## 2018-03-07 DIAGNOSIS — I5032 Chronic diastolic (congestive) heart failure: Secondary | ICD-10-CM | POA: Diagnosis not present

## 2018-03-07 DIAGNOSIS — F329 Major depressive disorder, single episode, unspecified: Secondary | ICD-10-CM | POA: Diagnosis not present

## 2018-03-07 DIAGNOSIS — I130 Hypertensive heart and chronic kidney disease with heart failure and stage 1 through stage 4 chronic kidney disease, or unspecified chronic kidney disease: Secondary | ICD-10-CM | POA: Diagnosis not present

## 2018-03-07 DIAGNOSIS — M6281 Muscle weakness (generalized): Secondary | ICD-10-CM | POA: Diagnosis not present

## 2018-03-07 DIAGNOSIS — R262 Difficulty in walking, not elsewhere classified: Secondary | ICD-10-CM | POA: Diagnosis not present

## 2018-03-07 DIAGNOSIS — H409 Unspecified glaucoma: Secondary | ICD-10-CM | POA: Diagnosis not present

## 2018-03-07 DIAGNOSIS — I10 Essential (primary) hypertension: Secondary | ICD-10-CM

## 2018-03-07 DIAGNOSIS — R748 Abnormal levels of other serum enzymes: Secondary | ICD-10-CM

## 2018-03-07 DIAGNOSIS — E785 Hyperlipidemia, unspecified: Secondary | ICD-10-CM | POA: Diagnosis not present

## 2018-03-07 DIAGNOSIS — Z23 Encounter for immunization: Secondary | ICD-10-CM | POA: Diagnosis not present

## 2018-03-07 DIAGNOSIS — F4310 Post-traumatic stress disorder, unspecified: Secondary | ICD-10-CM | POA: Diagnosis not present

## 2018-03-07 DIAGNOSIS — Z9981 Dependence on supplemental oxygen: Secondary | ICD-10-CM | POA: Diagnosis not present

## 2018-03-07 DIAGNOSIS — K219 Gastro-esophageal reflux disease without esophagitis: Secondary | ICD-10-CM | POA: Diagnosis not present

## 2018-03-07 DIAGNOSIS — J9601 Acute respiratory failure with hypoxia: Secondary | ICD-10-CM | POA: Diagnosis not present

## 2018-03-07 DIAGNOSIS — J206 Acute bronchitis due to rhinovirus: Secondary | ICD-10-CM

## 2018-03-07 DIAGNOSIS — M109 Gout, unspecified: Secondary | ICD-10-CM | POA: Diagnosis not present

## 2018-03-07 DIAGNOSIS — F320 Major depressive disorder, single episode, mild: Secondary | ICD-10-CM | POA: Diagnosis not present

## 2018-03-07 DIAGNOSIS — M129 Arthropathy, unspecified: Secondary | ICD-10-CM | POA: Diagnosis not present

## 2018-03-07 DIAGNOSIS — I872 Venous insufficiency (chronic) (peripheral): Secondary | ICD-10-CM | POA: Diagnosis not present

## 2018-03-07 DIAGNOSIS — N183 Chronic kidney disease, stage 3 (moderate): Secondary | ICD-10-CM | POA: Diagnosis not present

## 2018-03-07 DIAGNOSIS — B351 Tinea unguium: Secondary | ICD-10-CM | POA: Diagnosis not present

## 2018-03-07 DIAGNOSIS — R7989 Other specified abnormal findings of blood chemistry: Secondary | ICD-10-CM

## 2018-03-07 LAB — BASIC METABOLIC PANEL
ANION GAP: 7 (ref 5–15)
BUN: 36 mg/dL — AB (ref 8–23)
CO2: 34 mmol/L — AB (ref 22–32)
Calcium: 8.7 mg/dL — ABNORMAL LOW (ref 8.9–10.3)
Chloride: 97 mmol/L — ABNORMAL LOW (ref 98–111)
Creatinine, Ser: 1 mg/dL (ref 0.44–1.00)
GFR calc Af Amer: 54 mL/min — ABNORMAL LOW (ref >60)
GFR calc non Af Amer: 47 mL/min — ABNORMAL LOW (ref >60)
GLUCOSE: 146 mg/dL — AB (ref 70–99)
POTASSIUM: 4.2 mmol/L (ref 3.5–5.1)
Sodium: 138 mmol/L (ref 135–145)

## 2018-03-07 MED ORDER — PREDNISONE 10 MG PO TABS
10.0000 mg | ORAL_TABLET | Freq: Every day | ORAL | 0 refills | Status: DC
Start: 2018-03-07 — End: 2018-03-07

## 2018-03-07 NOTE — Progress Notes (Signed)
Patient discharged back to [facility] today per MD orders. Patient vital signs WDL. IV removed and site WDL. Discharge Instructions including follow up appointments, medications, and education reviewed with nurse Asa Lente.  Patient is transported out via wheelchair.

## 2018-03-07 NOTE — Discharge Summary (Signed)
Physician Discharge Summary  [name] CZY:[id number] DOB: July 24, 1922 DOA: 03/03/2018  PCP: Wille Celeste, PA-C  Admit date: 03/03/2018 Discharge date: 03/09/2018  Time spent: 45 minutes  Recommendations for Outpatient Follow-up:  -Will be discharged back to SNF today. -Advised to follow up with PCP in 2 weeks.   Discharge Diagnoses:  Active Problems:   Essential hypertension   Acute respiratory failure (HCC)   Acute bronchitis   Elevated troponin   Chest pain   CKD (chronic kidney disease) stage 3, GFR 30-59 ml/min (HCC)   Chronic diastolic CHF (congestive heart failure) (HCC)   Urinary tract infection without hematuria   Discharge Condition: Stable and improved  Filed Weights   03/05/18 0609 03/06/18 0500 03/09/2018 0500  Weight: 78.3 kg (172 lb 9.9 oz) 76.8 kg (169 lb 5 oz) 77.1 kg (169 lb 15.6 oz)    History of present illness:  As per Dr. Carles Collet on 7/6: [name] is a 83 y.o. female with medical history of diastolic CHF, CKD stage III, GERD, hyperlipidemia, hypertension, CAD presenting with 3-day history of fevers, chills, cough, and shortness of breath with her symptoms worsening the last 24 hours.  The patient has been producing white sputum without hemoptysis.  In addition, the patient was noted to have a new oxygen requirement at the [facility].  As result, the patient was sent to the emergency department for further evaluation.  She denies any nausea, vomiting, diarrhea, abdominal pain, dysuria, hematuria, headache, neck pain. In the emergency department, the patient was noted to have a temperature 100.6 F but was hemodynamically stable.  She was noted to have oxygen saturation 88% on room air.  BMP, LFTs, CBC were largely unremarkable.  UA shows 21-50 WBC.  Chest x-ray shows interstitial prominence without consolidations.  Patient was started on ceftriaxone.     Hospital Course:   Acute respiratory failure with hypoxemia -Due to acute bronchitis,  viral respiratory panel with positive rhinovirus and enterovirus. -Patient's breathing is much improved, her prednisone has been transitioned to oral and is currently being tapered. -No evidence of bacterial pneumonia either by labs or by x-rays. -Continues on 2 L of oxygen, continue to wean to keep sats above 90%.  Generalized weakness -Likely due to infectious process, continue PT at SNF. -TSH was 0.369 and vitamin B12 was 605, both within normal limits.  Elevated troponin -Due to demand ischemia, EKG shows sinus rhythm with nonspecific T wave changes -2D echo shows ejection fraction of 65 to 70% with grade 1 diastolic dysfunction, no wall motion abnormalities.  Procedures:  As above   Consultations:  None  Discharge Instructions  Discharge Instructions    Diet - low sodium heart healthy   Complete by:  As directed    Increase activity slowly   Complete by:  As directed      Allergies as of 03/09/2018      Reactions   Bee Venom Anaphylaxis   Angiotensin Receptor Blockers    Tongue swelling.   Aspirin    Directed by physician   Cabbage Other (See Comments)   Certain foods due to gout flares   Latex Rash      Medication List    TAKE these medications   acetaminophen 500 MG tablet Commonly known as:  TYLENOL Take 1,000 mg by mouth 2 (two) times daily. For leg pain and arthritis. May have additional 1000 mg for break through pain   amLODipine 5 MG tablet Commonly known as:  NORVASC Take 5 mg by mouth daily.   calcium-vitamin D 500-200 MG-UNIT tablet Take 1 tablet by mouth 2 (two) times daily.   citalopram 20 MG tablet Commonly known as:  CELEXA Take 20 mg by mouth daily.   cycloSPORINE 0.05 % ophthalmic emulsion Commonly known as:  RESTASIS Place 1 drop into both eyes 2 (two) times daily.   docusate sodium 100 MG capsule Commonly known as:  COLACE Take 100 mg by mouth 2 (two) times daily.   donepezil 10 MG tablet Commonly known as:  ARICEPT Take 5 mg  by mouth daily. For dementia   guaiFENesin 600 MG 12 hr tablet Commonly known as:  MUCINEX Take 600 mg by mouth 2 (two) times daily.   hydrALAZINE 25 MG tablet Commonly known as:  APRESOLINE Take 1 1/2 tab to= 37.5 mg by mouth at bedtime   hydrochlorothiazide 12.5 MG tablet Commonly known as:  HYDRODIURIL Take 12.5 mg by mouth daily.   ipratropium-albuterol 0.5-2.5 (3) MG/3ML Soln Commonly known as:  DUONEB Take 3 mLs by nebulization every 6 (six) hours as needed.   loratadine 10 MG tablet Commonly known as:  CLARITIN Take 10 mg by mouth daily.   LORazepam 0.5 MG tablet Commonly known as:  ATIVAN Take 1 tablet (0.5 mg total) by mouth 2 (two) times daily.   lovastatin 40 MG tablet Commonly known as:  MEVACOR Take 40 mg by mouth at bedtime.   nitroGLYCERIN 0.4 MG SL tablet Commonly known as:  NITROSTAT Place 0.4 mg under the tongue every 5 (five) minutes as needed for chest pain.   pantoprazole 20 MG tablet Commonly known as:  PROTONIX Take 20 mg by mouth daily. Do not crush.   potassium chloride 10 MEQ tablet Commonly known as:  K-DUR,KLOR-CON Take 10 mEq by mouth daily.   predniSONE 10 MG tablet Commonly known as:  DELTASONE Take 1 tablet (10 mg total) by mouth daily with breakfast. Take 6 tablets today and then decrease by 1 tablet daily until none are left.   SIMBRINZA 1-0.2 % Susp Generic drug:  Brinzolamide-Brimonidine Apply to eye. One  drop into both eyes three times a day   Travoprost (BAK Free) 0.004 % Soln ophthalmic solution Commonly known as:  TRAVATAN Place 1 drop into both eyes at bedtime. For glaucoma   VENELEX Oint Apply to bilateral buttocks, sacrum and coccyx q shift and prn incontinence for protection and moisture related skin irritation      Allergies  Allergen Reactions  . Bee Venom Anaphylaxis  . Angiotensin Receptor Blockers     Tongue swelling.  . Aspirin     Directed by physician  . Cabbage Other (See Comments)    Certain  foods due to gout flares  . Latex Rash   Follow-up Information    Wille Celeste, PA-C. Schedule an appointment as soon as possible for a visit in 2 week(s).   Specialties:  Internal Medicine, Physician Assistant Contact information: [address] [place] [address] [phone]            The results of significant diagnostics from this hospitalization (including imaging, microbiology, ancillary and laboratory) are listed below for reference.    Significant Diagnostic Studies: US Venous Img Lower Bilateral  Result Date: 03/04/2018 CLINICAL DATA:  Bilateral lower extremity pain. Shortness of breath for 3 days. History of prior DVT. Evaluate for acute or chronic DVT. EXAM: BILATERAL LOWER EXTREMITY VENOUS DOPPLER ULTRASOUND TECHNIQUE: Gray-scale sonography with graded compression, as well as color  Doppler and duplex ultrasound were performed to evaluate the lower extremity deep venous systems from the level of the common femoral vein and including the common femoral, femoral, profunda femoral, popliteal and calf veins including the posterior tibial, peroneal and gastrocnemius veins when visible. The superficial great saphenous vein was also interrogated. Spectral Doppler was utilized to evaluate flow at rest and with distal augmentation maneuvers in the common femoral, femoral and popliteal veins. COMPARISON:  None. FINDINGS: RIGHT LOWER EXTREMITY Common Femoral Vein: No evidence of thrombus. Normal compressibility, respiratory phasicity and response to augmentation. Saphenofemoral Junction: No evidence of thrombus. Normal compressibility and flow on color Doppler imaging. Profunda Femoral Vein: No evidence of thrombus. Normal compressibility and flow on color Doppler imaging. Femoral Vein: No evidence of thrombus. Normal compressibility, respiratory phasicity and response to augmentation. Popliteal Vein: No evidence of thrombus. Normal compressibility, respiratory phasicity and response  to augmentation. Calf Veins: No evidence of thrombus. Normal compressibility and flow on color Doppler imaging. Superficial Great Saphenous Vein: No evidence of thrombus. Normal compressibility. Venous Reflux:  None. Other Findings:  None. LEFT LOWER EXTREMITY Common Femoral Vein: No evidence of thrombus. Normal compressibility, respiratory phasicity and response to augmentation. Saphenofemoral Junction: No evidence of thrombus. Normal compressibility and flow on color Doppler imaging. Profunda Femoral Vein: No evidence of thrombus. Normal compressibility and flow on color Doppler imaging. Femoral Vein: No evidence of thrombus. Normal compressibility, respiratory phasicity and response to augmentation. Popliteal Vein: No evidence of thrombus. Normal compressibility, respiratory phasicity and response to augmentation. Calf Veins: No evidence of thrombus. Normal compressibility and flow on color Doppler imaging. Superficial Great Saphenous Vein: No evidence of thrombus. Normal compressibility. Venous Reflux:  None. Other Findings:  None. IMPRESSION: No evidence of acute or chronic DVT within either lower extremity. Electronically Signed   By: Sandi Mariscal M.D.   On: 03/04/2018 10:24   Dg Chest Port 1 View  Result Date: 03/03/2018 CLINICAL DATA:  Cough and fever EXAM: PORTABLE CHEST 1 VIEW COMPARISON:  Chest radiograph July 30, 2015 and chest CT June 07, 2017 FINDINGS: There is no edema or consolidation. Heart is mildly enlarged with pulmonary vascularity normal. No adenopathy. There is degenerative change in each shoulder. IMPRESSION: Stable cardiac prominence.  No edema or consolidation. Electronically Signed   By: Lowella Grip III M.D.   On: 03/03/2018 10:32    Microbiology: Recent Results (from the past 240 hour(s))  Blood Culture (routine x 2)     Status: None (Preliminary result)   Collection Time: 03/03/18 10:10 AM  Result Value Ref Range Status   Specimen Description   Final    BLOOD RIGHT  FOREARM BOTTLES DRAWN AEROBIC AND ANAEROBIC   Special Requests   Final    Blood Culture results may not be optimal due to an excessive volume of blood received in culture bottles   Culture   Final    NO GROWTH 4 DAYS Performed at [facility], [address] [place], [place] [address]    Report Status PENDING  Incomplete  Blood Culture (routine x 2)     Status: None (Preliminary result)   Collection Time: 03/03/18 10:14 AM  Result Value Ref Range Status   Specimen Description   Final    BLOOD LEFT HAND BOTTLES DRAWN AEROBIC AND ANAEROBIC   Special Requests Blood Culture adequate volume  Final   Culture   Final    NO GROWTH 4 DAYS Performed at [facility], [address], [place], [place] [address]  Report Status PENDING  Incomplete  Urine culture     Status: None   Collection Time: 03/03/18 12:00 PM  Result Value Ref Range Status   Specimen Description   Final    URINE, CLEAN CATCH Performed at [facility], [address], [place], [place] [address]    Special Requests   Final    NONE Performed at [facility], [address], [place], [place] [address]    Culture   Final    NO GROWTH Performed at [facility], [place] [address] [place], [place] [address]    Report Status 03/04/2018 FINAL  Final  Respiratory Panel by PCR     Status: Abnormal   Collection Time: 03/03/18  9:39 PM  Result Value Ref Range Status   Adenovirus NOT DETECTED NOT DETECTED Final   Coronavirus 229E NOT DETECTED NOT DETECTED Final   Coronavirus HKU1 NOT DETECTED NOT DETECTED Final   Coronavirus NL63 NOT DETECTED NOT DETECTED Final   Coronavirus OC43 NOT DETECTED NOT DETECTED Final   Metapneumovirus NOT DETECTED NOT DETECTED Final   Rhinovirus / Enterovirus DETECTED (A) NOT DETECTED Final   Influenza A NOT DETECTED NOT DETECTED Final   Influenza B NOT DETECTED NOT DETECTED Final   Parainfluenza Virus 1 NOT DETECTED NOT DETECTED Final   Parainfluenza Virus 2 NOT DETECTED NOT DETECTED Final    Parainfluenza Virus 3 NOT DETECTED NOT DETECTED Final   Parainfluenza Virus 4 NOT DETECTED NOT DETECTED Final   Respiratory Syncytial Virus NOT DETECTED NOT DETECTED Final   Bordetella pertussis NOT DETECTED NOT DETECTED Final   Chlamydophila pneumoniae NOT DETECTED NOT DETECTED Final   Mycoplasma pneumoniae NOT DETECTED NOT DETECTED Final    Comment: Performed at [facility], [place] [address], [place], [place] [address]  MRSA PCR Screening     Status: Abnormal   Collection Time: 03/04/18  5:06 AM  Result Value Ref Range Status   MRSA by PCR POSITIVE (A) NEGATIVE Final    Comment:        The GeneXpert MRSA Assay (FDA approved for NASAL specimens only), is one component of a comprehensive MRSA colonization surveillance program. It is not intended to diagnose MRSA infection nor to guide or monitor treatment for MRSA infections. RESULT CALLED TO, READ BACK BY AND VERIFIED WITH: TAYLOR,[email or web address]  BY MATTHEWS, B 7.7.19 Performed at [facility], [address], [place], [place] [address]      Labs: Basic Metabolic Panel: Recent Labs  Lab 03/03/18 1008 03/04/18 0703 03/05/18 0515 03/06/18 0445 03/08/2018 0410  NA 140 143 140 139 138  K 3.8 3.5 4.0 3.8 4.2  CL 102 104 102 99 97*  CO2 30 32 30 32 34*  GLUCOSE 118* 84 184* 135* 146*  BUN 34* 28* 29* 31* 36*  CREATININE 1.16* 1.10* 1.05* 0.95 1.00  CALCIUM 8.8* 8.8* 8.6* 8.8* 8.7*  MG  --   --  2.2  --   --    Liver Function Tests: Recent Labs  Lab 03/03/18 1008  AST 19  ALT 12  ALKPHOS 65  BILITOT 0.7  PROT 7.0  ALBUMIN 3.5   No results for input(s): LIPASE, AMYLASE in the last 168 hours. No results for input(s): AMMONIA in the last 168 hours. CBC: Recent Labs  Lab 03/03/18 1008 03/04/18 0703  WBC 5.3 3.4*  NEUTROABS 4.1  --   HGB 10.2* 9.8*  HCT 31.9* 31.7*  MCV 85.5 86.1  PLT 174 166   Cardiac Enzymes: Recent  Labs  Lab 03/03/18 1008 03/03/18 1530 03/03/18 2127  TROPONINI 0.03* 0.03* 0.03*    BNP: BNP (last 3 results) Recent Labs    03/15/17 0730 06/28/17 0729 03/03/18 1014  BNP 64.0 319.0* 299.0*    ProBNP (last 3 results) No results for input(s): PROBNP in the last 8760 hours.  CBG: No results for input(s): GLUCAP in the last 168 hours.     Signed:  Lelon Frohlich  [facility] Pager: [phone] 03/12/2018, 12:09 PM

## 2018-03-07 NOTE — Progress Notes (Signed)
Location:   [facility] Room Number: 122/D Place of Service:  SNF (31) Provider:  Granville Lewis PA-C  Wille Celeste, PA-C  Patient Care Team: [name] as PCP - General (Internal Medicine) [name], MD as Consulting Physician (Geriatric Medicine)  Extended Emergency Contact Information Primary Emergency Contact: [name], [place] [address] [name] of [place] Phone: [phone] Relation: Daughter Secondary Emergency Contact: [name] [place] of [place] Phone: [phone] Relation: Son  Code Status:  DNR Goals of care: Advanced Directive information Advanced Directives 03/17/2018  Does Patient Have a Medical Advance Directive? Yes  Type of Advance Directive Out of facility DNR (pink MOST or yellow form)  Does patient want to make changes to medical advance directive? No - Patient declined  Copy of [place] in Chart? No - copy requested  Would patient like information on creating a medical advance directive? No - Patient declined  Pre-existing out of facility DNR order (yellow form or pink MOST form) -     Chief Complaint  Patient presents with  . Hospitalization Follow-up    Patient is being seen for Hospitalization F/U  For bronchitis with hypoxia  HPI:  Pt is a 84 y.o. female seen today for a hospital follow-up status post admission for acute respiratory failure failure with hypoxia  Patient developed a cough in the facility- and antibiotics were prescribed as well as Mucinex-she was also encouraged to use her PRN nebulizers   However the next morning she developed a fever and had hypoxia--  She was sent to the ER where she was diagnosed with bronchitis with hypoxia  Chest x-ray did not show evidence of pneumonia did show interstitial prominence without consolidation.  She was started on Rocephin--- viral respiratory panel was positive for rhinovirus and  enterovirus.  She improved with treatment and had prednisone as well which has been transitioned to oral.  She is on oxygen at this point with recommendation to try to wean--to keep sats above 90%   She also has some increased weakness this was thought due to the infection- she appears to be relatively back at her baseline this afternoon.  She also had an elevated troponin this was thought due to demand ischemia EKG showed sinus rhythm with nonspecific T wave changes.  Her other medical issues include diastolic CHF she has refused Lasix continues had been on low-dose hydrochlorothiazide-- also history of hypertension on numerous agents including Norvasc hydralazine- Norvasc and hydrochlorothiazide were held initially in the hospital because of some hypotension- is now back on low-dose Norvasc and hydralazine once a day this will have to be watched  Blood pressure manually today was 142/60- previous reading 127/63   Other medical issues include dementia with anxiety-depression-hyperlipidemia-GERD- all these apparently were stable during her hospitalization.   Currently she is sitting in her wheelchair comfortably she is bright alert talkative appears to be herself--       Past Medical History:  Diagnosis Date  . Anxiety   . Cellulitis 2016  . CHF (congestive heart failure) ([place])   . Chronic kidney disease   . Dementia   . Dysphagia   . Gait abnormality   . GERD (gastroesophageal reflux disease)   . Glaucoma   . Gout   . Hyperlipidemia   . Hypertension   . Muscle weakness   . Myocardial infarction [facility] [place]  . Osteoarthritis   .  Syncope    Past Surgical History:  Procedure Laterality Date  . APPENDECTOMY    . CATARACT EXTRACTION W/PHACO  08/04/2011   Procedure: CATARACT EXTRACTION PHACO AND INTRAOCULAR LENS PLACEMENT (IOC);  Surgeon: Tonny Branch;  Location: [facility];  Service: Ophthalmology;  Laterality: Right;  CDE=18.53  . CATARACT EXTRACTION W/PHACO   08/25/2011   Procedure: CATARACT EXTRACTION PHACO AND INTRAOCULAR LENS PLACEMENT (IOC);  Surgeon: Tonny Branch;  Location: [facility];  Service: Ophthalmology;  Laterality: Left;  CDE:14.76  . TONSILLECTOMY      Allergies  Allergen Reactions  . Bee Venom Anaphylaxis  . Angiotensin Receptor Blockers     Tongue swelling.  . Aspirin     Directed by physician  . Cabbage Other (See Comments)    Certain foods due to gout flares  . Latex Rash    Allergies as of 03/24/2018      Reactions   Bee Venom Anaphylaxis   Angiotensin Receptor Blockers    Tongue swelling.   Aspirin    Directed by physician   Cabbage Other (See Comments)   Certain foods due to gout flares   Latex Rash      Medication List        Accurate as of 03/03/2018  2:54 PM. Always use your most recent med list.          acetaminophen 500 MG tablet Commonly known as:  TYLENOL Take 1,000 mg by mouth 2 (two) times daily. For leg pain and arthritis. May have additional 1000 mg for break through pain   amLODipine 5 MG tablet Commonly known as:  NORVASC Take 5 mg by mouth daily.   calcium-vitamin D 500-200 MG-UNIT tablet Take 1 tablet by mouth 2 (two) times daily.   citalopram 20 MG tablet Commonly known as:  CELEXA Take 20 mg by mouth daily.   cycloSPORINE 0.05 % ophthalmic emulsion Commonly known as:  RESTASIS Place 1 drop into both eyes 2 (two) times daily.   docusate sodium 100 MG capsule Commonly known as:  COLACE Take 100 mg by mouth 2 (two) times daily.   donepezil 10 MG tablet Commonly known as:  ARICEPT Take 5 mg by mouth daily. For dementia   guaiFENesin 600 MG 12 hr tablet Commonly known as:  MUCINEX Take 600 mg by mouth 2 (two) times daily.   hydrALAZINE 25 MG tablet Commonly known as:  APRESOLINE Take 1 1/2 tab to= 37.5 mg by mouth three times a day   hydrochlorothiazide 12.5 MG tablet Commonly known as:  HYDRODIURIL Take 12.5 mg by mouth daily.   ipratropium-albuterol 0.5-2.5 (3) MG/3ML  Soln Commonly known as:  DUONEB Take 3 mLs by nebulization every 6 (six) hours as needed.   loratadine 10 MG tablet Commonly known as:  CLARITIN Take 10 mg by mouth daily.   LORazepam 0.5 MG tablet Commonly known as:  ATIVAN Take 1 tablet (0.5 mg total) by mouth 2 (two) times daily.   lovastatin 40 MG tablet Commonly known as:  MEVACOR Take 40 mg by mouth at bedtime.   nitroGLYCERIN 0.4 MG SL tablet Commonly known as:  NITROSTAT Place 0.4 mg under the tongue every 5 (five) minutes as needed for chest pain.   pantoprazole 20 MG tablet Commonly known as:  PROTONIX Take 20 mg by mouth 2 (two) times daily. Do not crush.   potassium chloride 10 MEQ tablet Commonly known as:  K-DUR,KLOR-CON Take 10 mEq by mouth daily.   RISA-BID PROBIOTIC Tabs Take 1 tablet by  mouth twice a day from 03/02/2018-03/12/2018   SIMBRINZA 1-0.2 % Susp Generic drug:  Brinzolamide-Brimonidine Apply to eye. One  drop into both eyes three times a day   Travoprost (BAK Free) 0.004 % Soln ophthalmic solution Commonly known as:  TRAVATAN Place 1 drop into both eyes at bedtime. For glaucoma   VENELEX Oint Apply to bilateral buttocks, sacrum and coccyx q shift and prn incontinence for protection and moisture related skin irritation       Review of Systems   In general she is not complaining of any fever chills says she is feeling better  Skin does not complain of rashes or itching or diaphoresis--she does have venous stasis changes chronic lower extremities  Head ears eyes nose mouth and throat is not complaining of any sore throat or visual changes   Respiratory does not complain of increased shortness of breath she is on oxygen currently- I do note some wheezing on exam   Cardiac is not complaining of any chest pain thinks her edema has improved during hospitalization   GI is not complaining of abdominal pain nausea vomiting diarrhea or constipation   GU is not complaining of  dysuria   Musculoskeletal at times will complain of leg pain but is not complaining of that currently-is not complaining of joint pain   Neurologic is not complaining of dizziness headache or numbness   Psych appears to be at baseline she is bright and alert has some mild cognitive issues but is doing well and appears to be talkative self today--she does have a history of some anxiety and depression but this appears stable currently not complaining of that today   Immunization History  Administered Date(s) Administered  . Influenza Whole 06/11/2007, 10/16/2008, 05/21/2009, 05/05/2010  . Influenza-Unspecified 06/05/2014, 06/02/2016, 06/02/2017, 06/02/2017  . Pneumococcal Conjugate-13 06/06/2016  . Pneumococcal Polysaccharide-23 06/11/2007, 06/01/2016, 03/04/2018  . Pneumococcal-Unspecified 06/06/2016  . Td 06/11/2007  . Tdap 06/24/2017  . Zoster 05/21/2007   Pertinent  Health Maintenance Due  Topic Date Due  . INFLUENZA VACCINE  03/29/2018  . PNA vac Low Risk Adult (2 of 2 - PCV13) 03/05/2019  . DEXA SCAN  Completed   Fall Risk  05/22/2017  Falls in the past year? No   Functional Status Survey:    Vitals:   03/10/2018 1435  BP: 127/63  Pulse: 68  Resp: 18  Temp: 97.7 F (36.5 C)  TempSrc: Oral  SpO2: 95%   Manual blood pressure reading was 142/60 Physical Exam   In general this is a pleasant somewhat obese elderly female in no distress who appears to be back at her baseline no sign of distress  Skin is warm and dry she does have chronic venous stasis changes lower extremities bilaterally   Eyes visual acuity appears to be intact sclera and conjunctive are clear  Oropharynx clear mucous membranes moist  Chest there is no labored breathing she does have diffuse expiratory wheezing no sign of distress  Cardiac is not complaining of chest pain has chronic lower extremity edema if anything  this actually appears to be improved compared to previous exam  Abdomen is  obese soft nontender with positive bowel sounds  Musculoskeletal moves her extremities at baseline has some chronic lower extremity weakness because of her edema-   Neurologic is grossly intact her speech is clear no lateralizing findings.  Psych-continues to be bright and alert has some mild cognitive deficits but is pleasant and talkative at her baseline--   Labs reviewed: Recent  Labs    03/05/18 0515 03/06/18 0445 03/28/2018 0410  NA 140 139 138  K 4.0 3.8 4.2  CL 102 99 97*  CO2 30 32 34*  GLUCOSE 184* 135* 146*  BUN 29* 31* 36*  CREATININE 1.05* 0.95 1.00  CALCIUM 8.6* 8.8* 8.7*  MG 2.2  --   --    Recent Labs    10/06/17 0743 03/03/18 1008  AST 20 19  ALT 12* 12  ALKPHOS 74 65  BILITOT 0.5 0.7  PROT 7.6 7.0  ALBUMIN 3.9 3.5   Recent Labs    06/05/17 0700  10/06/17 0743 11/03/17 0700 03/03/18 1008 03/04/18 0703  WBC 4.8   < > 3.9* 4.3 5.3 3.4*  NEUTROABS 2.7  --  2.0  --  4.1  --   HGB 10.8*   < > 12.0 11.8* 10.2* 9.8*  HCT 33.7*   < > 38.4 38.3 31.9* 31.7*  MCV 87.3   < > 83.8 84.7 85.5 86.1  PLT 234   < > 174 213 174 166   < > = values in this interval not displayed.   Lab Results  Component Value Date   TSH 0.369 03/05/2018   Lab Results  Component Value Date   HGBA1C 5.5 03/20/2017   Lab Results  Component Value Date   CHOL 234 (H) 09/22/2017   HDL 122 09/22/2017   LDLCALC 99 09/22/2017   TRIG 65 09/22/2017   CHOLHDL 1.9 09/22/2017    Significant Diagnostic Results in last 30 days:  US Venous Img Lower Bilateral  Result Date: 03/04/2018 CLINICAL DATA:  Bilateral lower extremity pain. Shortness of breath for 3 days. History of prior DVT. Evaluate for acute or chronic DVT. EXAM: BILATERAL LOWER EXTREMITY VENOUS DOPPLER ULTRASOUND TECHNIQUE: Gray-scale sonography with graded compression, as well as color Doppler and duplex ultrasound were performed to evaluate the lower extremity deep venous systems from the level of the common femoral vein  and including the common femoral, femoral, profunda femoral, popliteal and calf veins including the posterior tibial, peroneal and gastrocnemius veins when visible. The superficial great saphenous vein was also interrogated. Spectral Doppler was utilized to evaluate flow at rest and with distal augmentation maneuvers in the common femoral, femoral and popliteal veins. COMPARISON:  None. FINDINGS: RIGHT LOWER EXTREMITY Common Femoral Vein: No evidence of thrombus. Normal compressibility, respiratory phasicity and response to augmentation. Saphenofemoral Junction: No evidence of thrombus. Normal compressibility and flow on color Doppler imaging. Profunda Femoral Vein: No evidence of thrombus. Normal compressibility and flow on color Doppler imaging. Femoral Vein: No evidence of thrombus. Normal compressibility, respiratory phasicity and response to augmentation. Popliteal Vein: No evidence of thrombus. Normal compressibility, respiratory phasicity and response to augmentation. Calf Veins: No evidence of thrombus. Normal compressibility and flow on color Doppler imaging. Superficial Great Saphenous Vein: No evidence of thrombus. Normal compressibility. Venous Reflux:  None. Other Findings:  None. LEFT LOWER EXTREMITY Common Femoral Vein: No evidence of thrombus. Normal compressibility, respiratory phasicity and response to augmentation. Saphenofemoral Junction: No evidence of thrombus. Normal compressibility and flow on color Doppler imaging. Profunda Femoral Vein: No evidence of thrombus. Normal compressibility and flow on color Doppler imaging. Femoral Vein: No evidence of thrombus. Normal compressibility, respiratory phasicity and response to augmentation. Popliteal Vein: No evidence of thrombus. Normal compressibility, respiratory phasicity and response to augmentation. Calf Veins: No evidence of thrombus. Normal compressibility and flow on color Doppler imaging. Superficial Great Saphenous Vein: No evidence of  thrombus. Normal compressibility. Venous Reflux:  None. Other Findings:  None. IMPRESSION: No evidence of acute or chronic DVT within either lower extremity. Electronically Signed   By: Sandi Mariscal M.D.   On: 03/04/2018 10:24   Dg Chest Port 1 View  Result Date: 03/03/2018 CLINICAL DATA:  Cough and fever EXAM: PORTABLE CHEST 1 VIEW COMPARISON:  Chest radiograph July 30, 2015 and chest CT June 07, 2017 FINDINGS: There is no edema or consolidation. Heart is mildly enlarged with pulmonary vascularity normal. No adenopathy. There is degenerative change in each shoulder. IMPRESSION: Stable cardiac prominence.  No edema or consolidation. Electronically Signed   By: Lowella Grip III M.D.   On: 03/03/2018 10:32    Assessment/Plan  #1 history of acute respiratory failure with hypoxia thought secondary to bronchitis clinically she has improved she still is on oxygen with recommendation to wean her to stats 90% or greater- she is now on a prednisone taper-she is completed antibiotics.  She is also on Mucinex twice daily and has her PRN DuoNeb's.  At this point will monitor she still does have wheezing but appears to be quite comfortable and at her baseline.  2.  History of elevated troponin in the hospital this was thought secondary to demand ischemia no complaints of chest pain.  3.  Diastolic CHF thought to be grade 1-  has refused Lasix her hydrochlorothiazide is currently on hold secondary to some hypotension in the hospital-- at this point will monitor but would have a fairly low threshold for restarting this  #4-hypertension is on Norvasc 5 mg a day as well as hydralazine 37.5 mg at at bedtime- hydrochlorothiazide was held in the hospital as well as her other hydralazine dose as it appears- at this point will monitor manual blood pressure was systolically in the 884Z this afternoon previous blood pressure was 127/63-  #5-history of anxiety she is on Ativan twice a day which was her dose  before hospitalization we will monitor she appears to be doing fine today  #6-history of dementia again this appears to be fairly mild she is on Aricept-does have Celexa for coexistent depression- again she appears to be a good spirits today back at her baseline  #7- history of GERD- she is on Protonix twice daily- she has been seen by GI somewhat in the distant past and thought to have a ring at the GI junction that was dilated with balloon- also had a barium swallow and at that time showed age-related esophageal dysmotility--appears she is been on a proton pump inhibitor since then  #8 history of hyperlipidemia she is on lovastatin 40 mg a day LDL was 99 in January HDL was 122  9 chronic kidney disease- creatinine appears to be stable at 1.0 on lab done today with BUN of 36   #10 history of allergic rhinitis continues on Claritin she  has been on this for some time  #11 history of distal left femoral DVT- updated Doppler did not show evidence of a DVT- she has completed a course of Eliquis has refused to take aspirin  Again clinically she appears to be doing well continue to monitor she is currently requiring oxygen--will be attempts to wean her off here in the near future she also continues on prednisone is being tapered    [id number]-of note greater than 40 minutes spent assessing patient- reviewing her chart and labs- coordinating and formulating plan of care for numerous diagnoses- of note greater than 50% of time spent coordinating plan of care

## 2018-03-08 ENCOUNTER — Encounter: Payer: Self-pay | Admitting: Internal Medicine

## 2018-03-08 ENCOUNTER — Non-Acute Institutional Stay (SKILLED_NURSING_FACILITY): Payer: Medicare Other | Admitting: Internal Medicine

## 2018-03-08 DIAGNOSIS — I10 Essential (primary) hypertension: Secondary | ICD-10-CM

## 2018-03-08 DIAGNOSIS — J206 Acute bronchitis due to rhinovirus: Secondary | ICD-10-CM

## 2018-03-08 DIAGNOSIS — I5032 Chronic diastolic (congestive) heart failure: Secondary | ICD-10-CM | POA: Diagnosis not present

## 2018-03-08 LAB — CULTURE, BLOOD (ROUTINE X 2)
Culture: NO GROWTH
Culture: NO GROWTH
Special Requests: ADEQUATE

## 2018-03-08 NOTE — Progress Notes (Signed)
Provider: Veleta Miners MD  Location:   [facility] Room Number: 122/D Place of Service:  SNF (31)  PCP: Wille Celeste, PA-C Patient Care Team: [name] as PCP - General (Internal Medicine) [name], MD as Consulting Physician (Geriatric Medicine)  Extended Emergency Contact Information Primary Emergency Contact: [name], [place] [address] [name] of [place] Phone: [phone] Relation: Daughter Secondary Emergency Contact: [name] [place] of [place] Phone: [phone] Relation: Son  Code Status: DNR Goals of Care: Advanced Directive information Advanced Directives 03/08/2018  Does Patient Have a Medical Advance Directive? Yes  Type of Advance Directive Out of facility DNR (pink MOST or yellow form)  Does patient want to make changes to medical advance directive? No - Patient declined  Copy of [place] in Chart? No - copy requested  Would patient like information on creating a medical advance directive? No - Patient declined  Pre-existing out of facility DNR order (yellow form or pink MOST form) -      Chief Complaint  Patient presents with  . Readmit To SNF    Patient is being seen for Readmission  Visit    HPI: Patient is a 84 y.o. female seen today for Readmission to the SNF for therapy and long-term placement Patient has H/o Hypertension, Distal DVT, Chronic venous stasis,  Diastolic CHF chronic Renal disaese and GERD,Glaucoma,Dementia and Anxiety  Patient was admitted in the hospital from 07/06-07/10 for Hypoxia and Viral illness . She was treated for Bronchitis. With prednisone She did Receive 3 days of Antibiotics. .She was weaned of Oxygen and did well on RA. She also had Negative Dopplers of LE. Patient is complaining of productive cough.  Denies any fever, chills ,chest pain or shortness of breath.   Past Medical History:  Diagnosis Date  .  Anxiety   . Cellulitis 2016  . CHF (congestive heart failure) ([place])   . Chronic kidney disease   . Dementia   . Dysphagia   . Gait abnormality   . GERD (gastroesophageal reflux disease)   . Glaucoma   . Gout   . Hyperlipidemia   . Hypertension   . Muscle weakness   . Myocardial infarction [facility] [place]  . Osteoarthritis   . Syncope    Past Surgical History:  Procedure Laterality Date  . APPENDECTOMY    . CATARACT EXTRACTION W/PHACO  08/04/2011   Procedure: CATARACT EXTRACTION PHACO AND INTRAOCULAR LENS PLACEMENT (IOC);  Surgeon: Tonny Branch;  Location: [facility];  Service: Ophthalmology;  Laterality: Right;  CDE=18.53  . CATARACT EXTRACTION W/PHACO  08/25/2011   Procedure: CATARACT EXTRACTION PHACO AND INTRAOCULAR LENS PLACEMENT (IOC);  Surgeon: Tonny Branch;  Location: [facility];  Service: Ophthalmology;  Laterality: Left;  CDE:14.76  . TONSILLECTOMY      reports that she has never smoked. She has never used smokeless tobacco. She reports that she does not drink alcohol or use drugs. Social History   Socioeconomic History  . Marital status: Divorced    Spouse name: Not on file  . Number of children: Not on file  . Years of education: Not on file  . Highest education level: Not on file  Occupational History  . Occupation: retired   Scientific laboratory technician  . Financial resource strain: Not on file  . Food insecurity:    Worry: Not on file    Inability: Not on  file  . Transportation needs:    Medical: Not on file    Non-medical: Not on file  Tobacco Use  . Smoking status: Never Smoker  . Smokeless tobacco: Never Used  Substance and Sexual Activity  . Alcohol use: No  . Drug use: No  . Sexual activity: Not Currently  Lifestyle  . Physical activity:    Days per week: Not on file    Minutes per session: Not on file  . Stress: Not on file  Relationships  . Social connections:    Talks on phone: Not on file    Gets together: Not on file    Attends religious service: Not on  file    Active member of club or organization: Not on file    Attends meetings of clubs or organizations: Not on file    Relationship status: Not on file  . Intimate partner violence:    Fear of current or ex partner: Not on file    Emotionally abused: Not on file    Physically abused: Not on file    Forced sexual activity: Not on file  Other Topics Concern  . Not on file  Social History Narrative  . Not on file    Functional Status Survey:    Family History  Problem Relation Age of Onset  . Diabetes Father   . Diabetes Sister   . Diabetes Brother   . Stroke Sister   . Stroke Sister   . Anesthesia problems Neg Hx   . Hypotension Neg Hx   . Malignant hyperthermia Neg Hx   . Pseudochol deficiency Neg Hx     Health Maintenance  Topic Date Due  . INFLUENZA VACCINE  03/29/2018  . PNA vac Low Risk Adult (2 of 2 - PCV13) 03/05/2019  . TETANUS/TDAP  06/25/2027  . DEXA SCAN  Completed    Allergies  Allergen Reactions  . Bee Venom Anaphylaxis  . Angiotensin Receptor Blockers     Tongue swelling.  . Aspirin     Directed by physician  . Cabbage Other (See Comments)    Certain foods due to gout flares  . Latex Rash    Allergies as of 03/08/2018      Reactions   Bee Venom Anaphylaxis   Angiotensin Receptor Blockers    Tongue swelling.   Aspirin    Directed by physician   Cabbage Other (See Comments)   Certain foods due to gout flares   Latex Rash      Medication List        Accurate as of 03/08/18  2:07 PM. Always use your most recent med list.          acetaminophen 500 MG tablet Commonly known as:  TYLENOL Take 1,000 mg by mouth 2 (two) times daily. For leg pain and arthritis. May have additional 1000 mg for break through pain   amLODipine 5 MG tablet Commonly known as:  NORVASC Take 5 mg by mouth daily.   calcium-vitamin D 500-200 MG-UNIT tablet Take 1 tablet by mouth 2 (two) times daily.   citalopram 20 MG tablet Commonly known as:  CELEXA Take  20 mg by mouth daily.   cycloSPORINE 0.05 % ophthalmic emulsion Commonly known as:  RESTASIS Place 1 drop into both eyes 2 (two) times daily.   docusate sodium 100 MG capsule Commonly known as:  COLACE Take 100 mg by mouth 2 (two) times daily.   donepezil 10 MG tablet Commonly known as:  ARICEPT Take  5 mg by mouth daily. For dementia   guaiFENesin 600 MG 12 hr tablet Commonly known as:  MUCINEX Take 600 mg by mouth 2 (two) times daily.   hydrALAZINE 25 MG tablet Commonly known as:  APRESOLINE Take 1 1/2 tab to= 37.5 mg by mouth three times a day   hydrochlorothiazide 12.5 MG tablet Commonly known as:  HYDRODIURIL Take 12.5 mg by mouth daily.   ipratropium-albuterol 0.5-2.5 (3) MG/3ML Soln Commonly known as:  DUONEB Take 3 mLs by nebulization every 6 (six) hours as needed.   loratadine 10 MG tablet Commonly known as:  CLARITIN Take 10 mg by mouth daily.   LORazepam 0.5 MG tablet Commonly known as:  ATIVAN Take 1 tablet (0.5 mg total) by mouth 2 (two) times daily.   lovastatin 40 MG tablet Commonly known as:  MEVACOR Take 40 mg by mouth at bedtime.   nitroGLYCERIN 0.4 MG SL tablet Commonly known as:  NITROSTAT Place 0.4 mg under the tongue every 5 (five) minutes as needed for chest pain.   pantoprazole 20 MG tablet Commonly known as:  PROTONIX Take 20 mg by mouth 2 (two) times daily. Do not crush.   potassium chloride 10 MEQ tablet Commonly known as:  K-DUR,KLOR-CON Take 10 mEq by mouth daily.   predniSONE 10 MG tablet Commonly known as:  DELTASONE Take 50 mg (5 Tabs) by mouth once on 03/08/2018. Then take 40 mg (4 Tabs) by mouth once on 03/09/2018. Then take 30 mg (3 Tabs) once by mouth on 03/10/2018. Then take 20 mg (2 Tabs) once by mouth on 03/11/2018. Then take 10 mg (1 Tab) once by mouth on 03/12/2018.   SIMBRINZA 1-0.2 % Susp Generic drug:  Brinzolamide-Brimonidine Apply to eye. One  drop into both eyes three times a day   Travoprost (BAK Free) 0.004 %  Soln ophthalmic solution Commonly known as:  TRAVATAN Place 1 drop into both eyes at bedtime. For glaucoma   VENELEX Oint Apply to bilateral buttocks, sacrum and coccyx q shift and prn incontinence for protection and moisture related skin irritation       Review of Systems  Review of Systems  Constitutional: Negative for activity change, appetite change, chills, diaphoresis, fatigue and fever.  HENT: Negative for mouth sores, postnasal drip, rhinorrhea, sinus pain and sore throat.   Respiratory: Negative for apnea,  chest tightness, shortness of breath and wheezing.   Cardiovascular: Negative for chest pain, palpitations and leg swelling.  Gastrointestinal: Negative for abdominal distention, abdominal pain, constipation, diarrhea, nausea and vomiting.  Genitourinary: Negative for dysuria and frequency.  Musculoskeletal: Negative for arthralgias, joint swelling and myalgias.  Skin: Negative for rash.  Neurological: Negative for dizziness, syncope, weakness, light-headedness and numbness.  Psychiatric/Behavioral: Negative for behavioral problems, confusion and sleep disturbance.     Vitals:   03/08/18 1333  BP: (!) 167/79  Pulse: (!) 59  Resp: 17  Temp: 97.9 F (36.6 C)   There is no height or weight on file to calculate BMI. Physical Exam  Constitutional: She is oriented to person, place, and time. She appears well-developed and well-nourished.  HENT:  Head: Normocephalic.  Mouth/Throat: Oropharynx is clear and moist.  Eyes: Pupils are equal, round, and reactive to light.  Neck: Neck supple.  Cardiovascular: Normal rate and regular rhythm.  Pulmonary/Chest: Effort normal.  Has Bilateral Wheezing  Abdominal: Soft. Bowel sounds are normal. She exhibits no distension. There is no tenderness. There is no guarding.  Musculoskeletal:  Mild edema Bilateral  Neurological: She  is alert and oriented to person, place, and time.  No Focal deficits  Skin: Skin is warm and dry.    Psychiatric: She has a normal mood and affect. Her behavior is normal. Thought content normal.    Labs reviewed: Basic Metabolic Panel: Recent Labs    03/05/18 0515 03/06/18 0445 03/20/2018 0410  NA 140 139 138  K 4.0 3.8 4.2  CL 102 99 97*  CO2 30 32 34*  GLUCOSE 184* 135* 146*  BUN 29* 31* 36*  CREATININE 1.05* 0.95 1.00  CALCIUM 8.6* 8.8* 8.7*  MG 2.2  --   --    Liver Function Tests: Recent Labs    10/06/17 0743 03/03/18 1008  AST 20 19  ALT 12* 12  ALKPHOS 74 65  BILITOT 0.5 0.7  PROT 7.6 7.0  ALBUMIN 3.9 3.5   No results for input(s): LIPASE, AMYLASE in the last 8760 hours. No results for input(s): AMMONIA in the last 8760 hours. CBC: Recent Labs    06/05/17 0700  10/06/17 0743 11/03/17 0700 03/03/18 1008 03/04/18 0703  WBC 4.8   < > 3.9* 4.3 5.3 3.4*  NEUTROABS 2.7  --  2.0  --  4.1  --   HGB 10.8*   < > 12.0 11.8* 10.2* 9.8*  HCT 33.7*   < > 38.4 38.3 31.9* 31.7*  MCV 87.3   < > 83.8 84.7 85.5 86.1  PLT 234   < > 174 213 174 166   < > = values in this interval not displayed.   Cardiac Enzymes: Recent Labs    03/03/18 1008 03/03/18 1530 03/03/18 2127  TROPONINI 0.03* 0.03* 0.03*   BNP: Invalid input(s): POCBNP Lab Results  Component Value Date   HGBA1C 5.5 03/20/2017   Lab Results  Component Value Date   TSH 0.369 03/05/2018   Lab Results  Component Value Date   VITAMINB12 605 03/05/2018   Lab Results  Component Value Date   FOLATE 17.7 08/15/2017   Lab Results  Component Value Date   IRON 36 08/15/2017   TIBC 434 08/15/2017   FERRITIN 22 08/15/2017    Imaging and Procedures obtained prior to SNF admission: US Venous Img Lower Bilateral  Result Date: 03/04/2018 CLINICAL DATA:  Bilateral lower extremity pain. Shortness of breath for 3 days. History of prior DVT. Evaluate for acute or chronic DVT. EXAM: BILATERAL LOWER EXTREMITY VENOUS DOPPLER ULTRASOUND TECHNIQUE: Gray-scale sonography with graded compression, as well as color  Doppler and duplex ultrasound were performed to evaluate the lower extremity deep venous systems from the level of the common femoral vein and including the common femoral, femoral, profunda femoral, popliteal and calf veins including the posterior tibial, peroneal and gastrocnemius veins when visible. The superficial great saphenous vein was also interrogated. Spectral Doppler was utilized to evaluate flow at rest and with distal augmentation maneuvers in the common femoral, femoral and popliteal veins. COMPARISON:  None. FINDINGS: RIGHT LOWER EXTREMITY Common Femoral Vein: No evidence of thrombus. Normal compressibility, respiratory phasicity and response to augmentation. Saphenofemoral Junction: No evidence of thrombus. Normal compressibility and flow on color Doppler imaging. Profunda Femoral Vein: No evidence of thrombus. Normal compressibility and flow on color Doppler imaging. Femoral Vein: No evidence of thrombus. Normal compressibility, respiratory phasicity and response to augmentation. Popliteal Vein: No evidence of thrombus. Normal compressibility, respiratory phasicity and response to augmentation. Calf Veins: No evidence of thrombus. Normal compressibility and flow on color Doppler imaging. Superficial Great Saphenous Vein: No evidence of thrombus. Normal  compressibility. Venous Reflux:  None. Other Findings:  None. LEFT LOWER EXTREMITY Common Femoral Vein: No evidence of thrombus. Normal compressibility, respiratory phasicity and response to augmentation. Saphenofemoral Junction: No evidence of thrombus. Normal compressibility and flow on color Doppler imaging. Profunda Femoral Vein: No evidence of thrombus. Normal compressibility and flow on color Doppler imaging. Femoral Vein: No evidence of thrombus. Normal compressibility, respiratory phasicity and response to augmentation. Popliteal Vein: No evidence of thrombus. Normal compressibility, respiratory phasicity and response to augmentation. Calf  Veins: No evidence of thrombus. Normal compressibility and flow on color Doppler imaging. Superficial Great Saphenous Vein: No evidence of thrombus. Normal compressibility. Venous Reflux:  None. Other Findings:  None. IMPRESSION: No evidence of acute or chronic DVT within either lower extremity. Electronically Signed   By: Sandi Mariscal M.D.   On: 03/04/2018 10:24   Dg Chest Port 1 View  Result Date: 03/03/2018 CLINICAL DATA:  Cough and fever EXAM: PORTABLE CHEST 1 VIEW COMPARISON:  Chest radiograph July 30, 2015 and chest CT June 07, 2017 FINDINGS: There is no edema or consolidation. Heart is mildly enlarged with pulmonary vascularity normal. No adenopathy. There is degenerative change in each shoulder. IMPRESSION: Stable cardiac prominence.  No edema or consolidation. Electronically Signed   By: Lowella Grip III M.D.   On: 03/03/2018 10:32    Assessment/Plan Acute bronchitis due to Rhinovirus On prednisone Taper Will Start her on Duo Nebs Q 6  Essential hypertension Will restart her Hydralazine 25 mg in Am Continue HCTZ  Chronic diastolic congestive heart failure  Doing well. Has lost weight and is now 167 lbs Will Monitor. Patient refuses Lasix in facility. She did get 1 dose in hospital.  Generalized weakness Therapy will evaluate and restart CKD Repeat BMP GERD Continue on Protonix.   Family/ staff Communication:   Labs/tests ordered:BMP and CBC  Total time spent in this patient care encounter was 45_ minutes; greater than 50% of the visit spent counseling patient, reviewing records , Labs and coordinating care for problems addressed at this encounter.

## 2018-03-15 ENCOUNTER — Encounter (HOSPITAL_COMMUNITY)
Admission: RE | Admit: 2018-03-15 | Discharge: 2018-03-15 | Disposition: A | Discharge status: Home / Self Care | Payer: Medicare Other | Source: Skilled Nursing Facility | Attending: Internal Medicine | Admitting: Internal Medicine

## 2018-03-15 DIAGNOSIS — J206 Acute bronchitis due to rhinovirus: Secondary | ICD-10-CM | POA: Insufficient documentation

## 2018-03-15 DIAGNOSIS — M6281 Muscle weakness (generalized): Secondary | ICD-10-CM | POA: Insufficient documentation

## 2018-03-15 DIAGNOSIS — I10 Essential (primary) hypertension: Secondary | ICD-10-CM | POA: Insufficient documentation

## 2018-03-15 DIAGNOSIS — Z9981 Dependence on supplemental oxygen: Secondary | ICD-10-CM | POA: Insufficient documentation

## 2018-03-15 DIAGNOSIS — I5032 Chronic diastolic (congestive) heart failure: Secondary | ICD-10-CM | POA: Insufficient documentation

## 2018-03-15 LAB — BASIC METABOLIC PANEL
ANION GAP: 8 (ref 5–15)
BUN: 45 mg/dL — ABNORMAL HIGH (ref 8–23)
CHLORIDE: 104 mmol/L (ref 98–111)
CO2: 28 mmol/L (ref 22–32)
Calcium: 8.9 mg/dL (ref 8.9–10.3)
Creatinine, Ser: 1.35 mg/dL — ABNORMAL HIGH (ref 0.44–1.00)
GFR calc Af Amer: 38 mL/min — ABNORMAL LOW (ref >60)
GFR calc non Af Amer: 32 mL/min — ABNORMAL LOW (ref >60)
GLUCOSE: 98 mg/dL (ref 70–99)
Potassium: 3.8 mmol/L (ref 3.5–5.1)
Sodium: 140 mmol/L (ref 135–145)

## 2018-03-15 LAB — CBC WITH DIFFERENTIAL/PLATELET
Basophils Absolute: 0 10*3/uL (ref 0.0–0.1)
Basophils Relative: 0 %
Eosinophils Absolute: 0.1 10*3/uL (ref 0.0–0.7)
Eosinophils Relative: 1 %
HEMATOCRIT: 36.1 % (ref 36.0–46.0)
HEMOGLOBIN: 11.6 g/dL — AB (ref 12.0–15.0)
LYMPHS PCT: 33 %
Lymphs Abs: 2.4 10*3/uL (ref 0.7–4.0)
MCH: 26.7 pg (ref 26.0–34.0)
MCHC: 32.1 g/dL (ref 30.0–36.0)
MCV: 83.2 fL (ref 78.0–100.0)
MONO ABS: 0.9 10*3/uL (ref 0.1–1.0)
MONOS PCT: 13 %
NEUTROS PCT: 53 %
Neutro Abs: 3.9 10*3/uL (ref 1.7–7.7)
Platelets: 249 10*3/uL (ref 150–400)
RBC: 4.34 MIL/uL (ref 3.87–5.11)
RDW: 15.4 % (ref 11.5–15.5)
WBC: 7.3 10*3/uL (ref 4.0–10.5)

## 2018-03-23 ENCOUNTER — Encounter (HOSPITAL_COMMUNITY)
Admission: RE | Admit: 2018-03-23 | Discharge: 2018-03-23 | Disposition: A | Discharge status: Home / Self Care | Payer: Medicare Other | Source: Skilled Nursing Facility | Attending: Internal Medicine | Admitting: Internal Medicine

## 2018-03-23 DIAGNOSIS — M6281 Muscle weakness (generalized): Secondary | ICD-10-CM | POA: Insufficient documentation

## 2018-03-23 DIAGNOSIS — J206 Acute bronchitis due to rhinovirus: Secondary | ICD-10-CM | POA: Insufficient documentation

## 2018-03-23 DIAGNOSIS — I130 Hypertensive heart and chronic kidney disease with heart failure and stage 1 through stage 4 chronic kidney disease, or unspecified chronic kidney disease: Secondary | ICD-10-CM | POA: Insufficient documentation

## 2018-03-23 LAB — BASIC METABOLIC PANEL
ANION GAP: 8 (ref 5–15)
BUN: 42 mg/dL — ABNORMAL HIGH (ref 8–23)
CO2: 29 mmol/L (ref 22–32)
Calcium: 9.2 mg/dL (ref 8.9–10.3)
Chloride: 103 mmol/L (ref 98–111)
Creatinine, Ser: 1.52 mg/dL — ABNORMAL HIGH (ref 0.44–1.00)
GFR calc Af Amer: 33 mL/min — ABNORMAL LOW (ref >60)
GFR calc non Af Amer: 28 mL/min — ABNORMAL LOW (ref >60)
GLUCOSE: 95 mg/dL (ref 70–99)
POTASSIUM: 3.8 mmol/L (ref 3.5–5.1)
SODIUM: 140 mmol/L (ref 135–145)

## 2018-03-27 DIAGNOSIS — F4310 Post-traumatic stress disorder, unspecified: Secondary | ICD-10-CM | POA: Diagnosis not present

## 2018-03-27 DIAGNOSIS — F320 Major depressive disorder, single episode, mild: Secondary | ICD-10-CM | POA: Diagnosis not present

## 2018-03-27 DIAGNOSIS — F428 Other obsessive-compulsive disorder: Secondary | ICD-10-CM | POA: Diagnosis not present

## 2018-03-27 DIAGNOSIS — F411 Generalized anxiety disorder: Secondary | ICD-10-CM | POA: Diagnosis not present

## 2018-03-28 DIAGNOSIS — F411 Generalized anxiety disorder: Secondary | ICD-10-CM | POA: Diagnosis not present

## 2018-03-28 DIAGNOSIS — F0390 Unspecified dementia without behavioral disturbance: Secondary | ICD-10-CM | POA: Diagnosis not present

## 2018-03-29 DEATH — deceased

## 2018-04-03 DIAGNOSIS — F320 Major depressive disorder, single episode, mild: Secondary | ICD-10-CM | POA: Diagnosis not present

## 2018-04-03 DIAGNOSIS — F4310 Post-traumatic stress disorder, unspecified: Secondary | ICD-10-CM | POA: Diagnosis not present

## 2018-04-03 DIAGNOSIS — F411 Generalized anxiety disorder: Secondary | ICD-10-CM | POA: Diagnosis not present

## 2018-04-03 DIAGNOSIS — F428 Other obsessive-compulsive disorder: Secondary | ICD-10-CM | POA: Diagnosis not present

## 2018-04-05 ENCOUNTER — Non-Acute Institutional Stay (SKILLED_NURSING_FACILITY): Payer: Medicare Other | Admitting: Internal Medicine

## 2018-04-05 ENCOUNTER — Encounter: Payer: Self-pay | Admitting: Internal Medicine

## 2018-04-05 DIAGNOSIS — F0391 Unspecified dementia with behavioral disturbance: Secondary | ICD-10-CM | POA: Diagnosis not present

## 2018-04-05 DIAGNOSIS — N183 Chronic kidney disease, stage 3 unspecified: Secondary | ICD-10-CM

## 2018-04-05 DIAGNOSIS — I10 Essential (primary) hypertension: Secondary | ICD-10-CM

## 2018-04-05 DIAGNOSIS — I5032 Chronic diastolic (congestive) heart failure: Secondary | ICD-10-CM

## 2018-04-05 DIAGNOSIS — K219 Gastro-esophageal reflux disease without esophagitis: Secondary | ICD-10-CM | POA: Diagnosis not present

## 2018-04-05 DIAGNOSIS — J206 Acute bronchitis due to rhinovirus: Secondary | ICD-10-CM

## 2018-04-05 NOTE — Progress Notes (Signed)
Location:    [facility] Room Number: 122/D Place of Service:  SNF (31) Provider:Jenniger Figiel [name] No Wille Cel[place], [place]  Patient Care Team: [name] as PCP - General (Internal Medicine) [name], MD as Consulting Physician (Geriatric Medicine)  Extended Emergency Contact Information Primary Emergency Contact: [name], [place] [address] [name] of [place] Phone: [phone] Relation: Daughter Secondary Emergency Contact: [name] [place] of [place] Phone: [phone] Relation: Son  Code Status:  DNR Goals of care: Advanced Directive information Advanced Directives 03/08/2018  Does Patient Have a Medical Advance Directive? Yes  Type of Advance Directive Out of facility DNR (pink MOST or yellow form)  Does patient want to make changes to medical advance directive? No - Patient declined  Copy of [place] in Chart? No - copy requested  Would patient like information on creating a medical advance directive? No - Patient declined  Pre-existing out of facility DNR order (yellow form or pink MOST form) -     Chief Complaint  Patient presents with  . Medical Management of Chronic Issues    Patient is being seen for rotine visit of medical management   . Acute Visit    Patients c/o Left leg pain   Medical management of chronic medical conditions including dementia- chronic venous stasis- renal insufficiency- diastolic CHF-GERD- anxiety and depression-hypertension-hyperlipidemia-.  Acute visit secondary to possible left leg discomfort     HPI:  Pt is a 84 y.o. female seen today for medical management of chronic diseases.  As noted above as well as complains of some left leg discomfort at times.  She is a long-term resident of facility and appears to be doing relatively well she was recently hospitalized for bronchitis URI and responded to a course of antibiotics and  prednisone- she does continue on Mucinex and DuoNeb's as needed but this appears to be stabilized  She does complain at times of some leg pain but this is rather nonspecific- and somewhat chronic at times- physical exam was fairly benign today but will need to be monitored- she is able to stand is weak but this is not new- I did speak with her restorative therapist who says she has not really noted any significant changes  At one point she had significant weight loss this was desired by her but weight appears to have stabilized since April at around 170 pounds- she does have a history of diastolic CHF- at one point possibility of initiating Lasix was discussed but patient did not want to start on this she is on low-dose hydrochlorothiazide.  In regards to hypertension in addition to hydrochlorothiazide she is on Norvasc as well as hydralazine 3 times a day- manual blood pressure today was 138/60- blood pressures continue to be somewhat variable ranging from 103/48 up to 166/61 at this point will monitor I do not see consistent highs or lows.  In regards to renal insufficiency creatinine the most recent lab was at the higher end of her baseline at 1.5 to baseline appears to run from around 1 to mid ones at times.  We will update this.  At one point she complained of some GERD symptoms and her Protonix was increased up to twice daily and this appears to help-she has seen GI in the past--- she was thought to have a noncritical ring at the GI junction that required dilation with balloon-she also had  a barium swallow which showed age-related esophageal dysmotility-and this apparently was the reason for starting her Protonix.  She does have a history of dementia but this appears stable on Aricept she also has Ativan twice daily for persistent anxiety appears to benefit from this.  Regards to hyperlipidemia she continues to have an excellent HDL of 122 this was on January lab LDL was 99-she does continue on  Mevacor.  She also has a history of glaucoma she is on topical eyedrops and is followed by ophthalmology.  Again her only complaint today is some leg pain which is somewhat nonspecific-she does have Tylenol for osteoarthritis.     Past Medical History:  Diagnosis Date  . Anxiety   . Cellulitis 2016  . CHF (congestive heart failure) ([place])   . Chronic kidney disease   . Dementia   . Dysphagia   . Gait abnormality   . GERD (gastroesophageal reflux disease)   . Glaucoma   . Gout   . Hyperlipidemia   . Hypertension   . Muscle weakness   . Myocardial infarction [facility] [place]  . Osteoarthritis   . Syncope    Past Surgical History:  Procedure Laterality Date  . APPENDECTOMY    . CATARACT EXTRACTION W/PHACO  08/04/2011   Procedure: CATARACT EXTRACTION PHACO AND INTRAOCULAR LENS PLACEMENT (IOC);  Surgeon: Tonny Branch;  Location: [facility];  Service: Ophthalmology;  Laterality: Right;  CDE=18.53  . CATARACT EXTRACTION W/PHACO  08/25/2011   Procedure: CATARACT EXTRACTION PHACO AND INTRAOCULAR LENS PLACEMENT (IOC);  Surgeon: Tonny Branch;  Location: [facility];  Service: Ophthalmology;  Laterality: Left;  CDE:14.76  . TONSILLECTOMY      Allergies  Allergen Reactions  . Bee Venom Anaphylaxis  . Angiotensin Receptor Blockers     Tongue swelling.  . Aspirin     Directed by physician  . Cabbage Other (See Comments)    Certain foods due to gout flares  . Latex Rash    Outpatient Encounter Medications as of 04/05/2018  Medication Sig  . acetaminophen (TYLENOL) 500 MG tablet Take 1,000 mg by mouth 2 (two) times daily. For leg pain and arthritis. May have additional 1000 mg for break through pain  . amLODipine (NORVASC) 5 MG tablet Take 5 mg by mouth daily.  Roseanne Kaufman [place]-Castor Oil (VENELEX) OINT Apply to bilateral buttocks, sacrum and coccyx q shift and prn incontinence for protection and moisture related skin irritation  . Brinzolamide-Brimonidine (SIMBRINZA) 1-0.2 % SUSP Apply to  eye. One  drop into both eyes three times a day  . Calcium Carbonate-Vitamin D (CALCIUM-VITAMIN D) 500-200 MG-UNIT tablet Take 1 tablet by mouth 2 (two) times daily.  . citalopram (CELEXA) 20 MG tablet Take 20 mg by mouth daily.  . cycloSPORINE (RESTASIS) 0.05 % ophthalmic emulsion Place 1 drop into both eyes 2 (two) times daily.  Marland Kitchen docusate sodium (COLACE) 100 MG capsule Take 100 mg by mouth 2 (two) times daily.  Marland Kitchen donepezil (ARICEPT) 10 MG tablet Take 5 mg by mouth daily. For dementia  . guaiFENesin (MUCINEX) 600 MG 12 hr tablet Take 600 mg by mouth 2 (two) times daily.  . hydrALAZINE (APRESOLINE) 25 MG tablet Take 1 1/2 tab to= 37.5 mg by mouth at bedtime  . hydrALAZINE (APRESOLINE) 25 MG tablet Take 25 mg by mouth every morning.  . hydrochlorothiazide (HYDRODIURIL) 12.5 MG tablet Take 12.5 mg by mouth daily.  Marland Kitchen ipratropium-albuterol (DUONEB) 0.5-2.5 (3) MG/3ML SOLN Take 3 mLs by nebulization every 6 (  six) hours as needed.  . loratadine (CLARITIN) 10 MG tablet Take 10 mg by mouth daily.  Marland Kitchen LORazepam (ATIVAN) 0.5 MG tablet Take 1 tablet (0.5 mg total) by mouth 2 (two) times daily.  Marland Kitchen lovastatin (MEVACOR) 40 MG tablet Take 40 mg by mouth at bedtime.  . nitroGLYCERIN (NITROSTAT) 0.4 MG SL tablet Place 0.4 mg under the tongue every 5 (five) minutes as needed for chest pain.   . pantoprazole (PROTONIX) 20 MG tablet Take 20 mg by mouth 2 (two) times daily. Do not crush.   . potassium chloride (K-DUR,KLOR-CON) 10 MEQ tablet Take 10 mEq by mouth daily.  . Travoprost, BAK Free, (TRAVATAN) 0.004 % SOLN ophthalmic solution Place 1 drop into both eyes at bedtime. For glaucoma  . [DISCONTINUED] predniSONE (DELTASONE) 10 MG tablet Take 50 mg (5 Tabs) by mouth once on 03/08/2018. Then take 40 mg (4 Tabs) by mouth once on 03/09/2018. Then take 30 mg (3 Tabs) once by mouth on 03/10/2018. Then take 20 mg (2 Tabs) once by mouth on 03/11/2018. Then take 10 mg (1 Tab) once by mouth on 03/12/2018.   No  facility-administered encounter medications on file as of 04/05/2018.      Review of Systems   In general she is not complaining of any fever chills weight appears to be stabilized  Skin does not complain of rashes or itching- she does have venous stasis changes which appears relatively baseline lower extremities.  Head ears eyes nose mouth and throat is not complaining of any sore throat or visual changes.  Respiratory is not complaining of increased cough or shortness of breath today she does not require oxygen.  Cardiac is not complaining of chest pain or palpitations she has chronic lower extremity edema which appears stable and improved status post weight loss earlier this year.  GI is not complaining of abdominal pain nausea vomiting diarrhea constipation.  GU is not complaining of dysuria today.  Musculoskeletal is not complaining of upper extremity pain does complain at times of leg pain at times right leg at times left leg-.  Neurologic does not complain dizziness headache or numbness or syncope.  Psych does have a history of mild dementia with anxiety and depression but this appears relatively stable    Immunization History  Administered Date(s) Administered  . Influenza Whole 06/11/2007, 10/16/2008, 05/21/2009, 05/05/2010  . Influenza-Unspecified 06/05/2014, 06/02/2016, 06/02/2017, 06/02/2017  . Pneumococcal Conjugate-13 06/06/2016  . Pneumococcal Polysaccharide-23 06/11/2007, 06/01/2016, 03/04/2018  . Pneumococcal-Unspecified 06/06/2016  . Td 06/11/2007  . Tdap 06/24/2017  . Zoster 05/21/2007   Pertinent  Health Maintenance Due  Topic Date Due  . INFLUENZA VACCINE  03/29/2018  . PNA vac Low Risk Adult (2 of 2 - PCV13) 03/05/2019  . DEXA SCAN  Completed   Fall Risk  05/22/2017  Falls in the past year? No   Functional Status Survey:    Vitals:   04/05/18 1306  BP: (!) 141/60  Pulse: (!) 56  Resp: 16  Temp: 97.9 F (36.6 C)  TempSrc: Oral  SpO2: 97%    Manual blood pressure was 138/60 pulse was 60 Weight is relatively stable at 170.4 pounds Physical Exam  In general this is a pleasant elderly female in no distress sitting comfortably in a wheelchair- she is a bit anxious but this is not unusual.  Her skin is warm and dry she has chronic venous stasis changes lower extremities bilaterally this appears to be baseline do not really see concerning erythema that i would  indicate cellulitis.  Eyes sclera and conjunctive are clear visual acuity appears grossly intact  Oropharynx is clear mucous membranes moist.  Chest is clear to auscultation there is no labored breathing.  Heart is regular rate and rhythm without murmur gallop or rub she has mild-moderate lower extremity edema which appears baseline with previous exam.  Abdomen is soft obese nontender with positive bowel sounds.  Musculoskeletal is able to move all extremities x4 at baseline strength appears to be intact all 4 extremities- she is able to flex and extend at the hip bilaterally without significant discomfort.  She is able to stand but is quite weak but this is not new-I do not note any deformity of her hip or leg.  She does have arthritic changes which are chronic  Neurologic is grossly intact her speech is clear no lateralizing findings.  Psych she is bright and alert grossly oriented has mild cognitive deficits at baseline but is pleasant appropriate a little bit anxious at times  Wellness here Labs reviewed: Recent Labs    03/05/18 0515  03/08/2018 0410 03/15/18 0700 03/23/18 0700  NA 140   < > 138 140 140  K 4.0   < > 4.2 3.8 3.8  CL 102   < > 97* 104 103  CO2 30   < > 34* 28 29  GLUCOSE 184*   < > 146* 98 95  BUN 29*   < > 36* 45* 42*  CREATININE 1.05*   < > 1.00 1.35* 1.52*  CALCIUM 8.6*   < > 8.7* 8.9 9.2  MG 2.2  --   --   --   --    < > = values in this interval not displayed.   Recent Labs    10/06/17 0743 03/03/18 1008  AST 20 19  ALT 12* 12   ALKPHOS 74 65  BILITOT 0.5 0.7  PROT 7.6 7.0  ALBUMIN 3.9 3.5   Recent Labs    10/06/17 0743  03/03/18 1008 03/04/18 0703 03/15/18 0700  WBC 3.9*   < > 5.3 3.4* 7.3  NEUTROABS 2.0  --  4.1  --  3.9  HGB 12.0   < > 10.2* 9.8* 11.6*  HCT 38.4   < > 31.9* 31.7* 36.1  MCV 83.8   < > 85.5 86.1 83.2  PLT 174   < > 174 166 249   < > = values in this interval not displayed.   Lab Results  Component Value Date   TSH 0.369 03/05/2018   Lab Results  Component Value Date   HGBA1C 5.5 03/20/2017   Lab Results  Component Value Date   CHOL 234 (H) 09/22/2017   HDL 122 09/22/2017   LDLCALC 99 09/22/2017   TRIG 65 09/22/2017   CHOLHDL 1.9 09/22/2017    Significant Diagnostic Results in last 30 days:  No results found.  Assessment/Plan #1-leg pain- physical exam was quite benign- at this point will monitor she does have Tylenol for osteoarthritic pain-I do not really note any changes today --pain complaints appear to be somewhat intermittent and variable at this point will monitor   #2- history of systolic CHF-her weight appears to be stable as well as edema she does not really want to take Lasix she is on low-dose hydrochlorothiazide-at this point will monitor it appears clinically compensated.  3.  Renal insufficiency recent creatinine was at the higher end of her baseline at 1.5 to with a BUN in the low 40s- will update this  to ensure stability.  4.  History o left femoral f distal DVT in the past- she has completed a course of Eliquis- has refused aspirin saying she has an allergy to it- updated Doppler did not really show any evidence of DVT  #5 history of chronic venous stasis again this appears relatively stabilized noted above she is only on low-dose hydrochlorothiazide as diuretic-has refused Lasix.  6.-  History of hypertension at this point appears fairly stable she has variable blood pressures but do not see consistent highs or lows-she is on Norvasc as well as  hydrochlorothiazide as well as hydralazine.  7.  History of bronchitis-again she was hospitalized for a URI recently but appears to have made a decent recovery currently is on Mucinex routinely and nebulizers as needed- exam today was fairly benign- he is not requiring oxygen.  8.  History of hypoxia in the past- this was worked up including a CT scan to rule out pulmonary embolism because she has a mildly elevated d-dimer- she is no longer requiring oxygen.  9.-  GERD-as noted in discussion above she is on Protonix twice daily has been followed by GI in the past for suspected esophageal dysmotility- this appears to be stable currently.  10.  History of dementia with anxiety she continues on Aricept and doing well with supportive care for anxiety she is on Ativan twice daily and appears to have tolerated this and appears to benefit from it.  She also has a history of depression and continues on Celexa.  11.  History of hyperlipidemia she is on Mevacor as noted above LDL was 99 HDL was 122 on January lab will update this periodically but appears to be under good control.  12 history of glaucoma continues on topical eyedrops and she is followed by ophthalmology vision appears to be relatively baseline today.  Again will update a metabolic panel to keep an eye on her renal function also monitor leg pain although I suspect this is somewhat episodic.  [id number] note greater than 40 minutes spent assessing patient-discussing her concerns at bedside-reviewing her chart and labs- and coordinating and formulating a plan of care for numerous diagnoses-of note greater than 50% of time spent coordinating a plan of care.

## 2018-04-09 ENCOUNTER — Encounter (HOSPITAL_COMMUNITY)
Admission: RE | Admit: 2018-04-09 | Discharge: 2018-04-09 | Disposition: A | Discharge status: Home / Self Care | Payer: Medicare Other | Source: Skilled Nursing Facility | Attending: Internal Medicine | Admitting: Internal Medicine

## 2018-04-09 DIAGNOSIS — Z9981 Dependence on supplemental oxygen: Secondary | ICD-10-CM | POA: Insufficient documentation

## 2018-04-09 DIAGNOSIS — J206 Acute bronchitis due to rhinovirus: Secondary | ICD-10-CM | POA: Insufficient documentation

## 2018-04-09 DIAGNOSIS — I5032 Chronic diastolic (congestive) heart failure: Secondary | ICD-10-CM | POA: Insufficient documentation

## 2018-04-09 DIAGNOSIS — I10 Essential (primary) hypertension: Secondary | ICD-10-CM | POA: Diagnosis not present

## 2018-04-09 DIAGNOSIS — M6281 Muscle weakness (generalized): Secondary | ICD-10-CM | POA: Diagnosis not present

## 2018-04-09 LAB — BASIC METABOLIC PANEL
ANION GAP: 8 (ref 5–15)
BUN: 42 mg/dL — ABNORMAL HIGH (ref 8–23)
CO2: 29 mmol/L (ref 22–32)
Calcium: 9.4 mg/dL (ref 8.9–10.3)
Chloride: 106 mmol/L (ref 98–111)
Creatinine, Ser: 1.24 mg/dL — ABNORMAL HIGH (ref 0.44–1.00)
GFR, EST AFRICAN AMERICAN: 42 mL/min — AB (ref >60)
GFR, EST NON AFRICAN AMERICAN: 36 mL/min — AB (ref >60)
Glucose, Bld: 90 mg/dL (ref 70–99)
Potassium: 3.8 mmol/L (ref 3.5–5.1)
Sodium: 143 mmol/L (ref 135–145)

## 2018-04-10 DIAGNOSIS — F4310 Post-traumatic stress disorder, unspecified: Secondary | ICD-10-CM | POA: Diagnosis not present

## 2018-04-10 DIAGNOSIS — F411 Generalized anxiety disorder: Secondary | ICD-10-CM | POA: Diagnosis not present

## 2018-04-10 DIAGNOSIS — F428 Other obsessive-compulsive disorder: Secondary | ICD-10-CM | POA: Diagnosis not present

## 2018-04-10 DIAGNOSIS — F320 Major depressive disorder, single episode, mild: Secondary | ICD-10-CM | POA: Diagnosis not present

## 2018-04-13 ENCOUNTER — Other Ambulatory Visit: Payer: Self-pay

## 2018-04-13 MED ORDER — LORAZEPAM 0.5 MG PO TABS: 0.5000 mg | ORAL_TABLET | Freq: Two times a day (BID) | ORAL | 0 refills | Status: DC

## 2018-04-17 DIAGNOSIS — F4310 Post-traumatic stress disorder, unspecified: Secondary | ICD-10-CM | POA: Diagnosis not present

## 2018-04-17 DIAGNOSIS — F411 Generalized anxiety disorder: Secondary | ICD-10-CM | POA: Diagnosis not present

## 2018-04-17 DIAGNOSIS — F428 Other obsessive-compulsive disorder: Secondary | ICD-10-CM | POA: Diagnosis not present

## 2018-04-17 DIAGNOSIS — F320 Major depressive disorder, single episode, mild: Secondary | ICD-10-CM | POA: Diagnosis not present

## 2018-04-24 DIAGNOSIS — F428 Other obsessive-compulsive disorder: Secondary | ICD-10-CM | POA: Diagnosis not present

## 2018-04-24 DIAGNOSIS — F4310 Post-traumatic stress disorder, unspecified: Secondary | ICD-10-CM | POA: Diagnosis not present

## 2018-04-24 DIAGNOSIS — F320 Major depressive disorder, single episode, mild: Secondary | ICD-10-CM | POA: Diagnosis not present

## 2018-04-24 DIAGNOSIS — F411 Generalized anxiety disorder: Secondary | ICD-10-CM | POA: Diagnosis not present

## 2018-05-07 ENCOUNTER — Non-Acute Institutional Stay (SKILLED_NURSING_FACILITY): Payer: Medicare Other | Admitting: Internal Medicine

## 2018-05-07 ENCOUNTER — Encounter: Payer: Self-pay | Admitting: Internal Medicine

## 2018-05-07 DIAGNOSIS — I10 Essential (primary) hypertension: Secondary | ICD-10-CM

## 2018-05-07 DIAGNOSIS — M79605 Pain in left leg: Secondary | ICD-10-CM

## 2018-05-07 DIAGNOSIS — I5032 Chronic diastolic (congestive) heart failure: Secondary | ICD-10-CM | POA: Diagnosis not present

## 2018-05-07 NOTE — Progress Notes (Signed)
Location:    [facility] Room Number: 122/D Place of Service:  SNF [phone]) Provider:  Mickel Duhamel, PA-C  Patient Care Team: [name] as PCP - General (Internal Medicine) [name], MD as Consulting Physician (Geriatric Medicine)  Extended Emergency Contact Information Primary Emergency Contact: [name], [place] [address] [name] of [place] Phone: [phone] Relation: Daughter Secondary Emergency Contact: [name] [place] of [place] Phone: [phone] Relation: Son  Code Status:  DNR Goals of care: Advanced Directive information Advanced Directives 05/07/2018  Does Patient Have a Medical Advance Directive? Yes  Type of Advance Directive Out of facility DNR (pink MOST or yellow form)  Does patient want to make changes to medical advance directive? No - Patient declined  Copy of [place] in Chart? No - copy requested  Would patient like information on creating a medical advance directive? No - Patient declined  Pre-existing out of facility DNR order (yellow form or pink MOST form) -     Chief Complaint  Patient presents with  . Acute Visit    Patients c/o leg swelling   As well as left leg pain  HPI:  Pt is a 84 y.o. female seen today for an acute visit for complaints of left leg pain- and possible swelling.  Patient is a long-term resident of facility with a history of dementia as well as chronic venous stasis edema-renal insufficiency- diastolic CHF in addition to GERD anxiety and depression- she also has a history of hypertension and hyperlipidemia.  Time she will have somewhat chronic complaints of leg swelling and discomfort.  However she says the left leg pain and hip discomfort appears to have worsened.  She is also complaining of increased edema although on exam edema appears to be relatively baseline with previous exams.  She has gained a  small amount of weight at 175 pounds- baseline appears to be in the high1 60s to low1 70s appear to be a precipitous gain  She is on low-dose hydrochlorothiazide she has refused Lasix since she does not want to be on diuretic.  Does again have a history of diastolic CHF  She also has a history of a left femoral distal DVT in the past- he did complete a course of Eliquis and has refused aspirin saying she has an allergy to it-an updated Doppler was done which did not really show any evidence of a DVT.  She does not report any recent trauma or fall.     Past Medical History:  Diagnosis Date  . Anxiety   . Cellulitis 2016  . CHF (congestive heart failure) ([place])   . Chronic kidney disease   . Dementia   . Dysphagia   . Gait abnormality   . GERD (gastroesophageal reflux disease)   . Glaucoma   . Gout   . Hyperlipidemia   . Hypertension   . Muscle weakness   . Myocardial infarction [facility]) [place]  . Osteoarthritis   . Syncope    Past Surgical History:  Procedure Laterality Date  . APPENDECTOMY    . CATARACT EXTRACTION W/PHACO  08/04/2011   Procedure: CATARACT EXTRACTION PHACO AND INTRAOCULAR LENS PLACEMENT (IOC);  Surgeon: Tonny Branch;  Location: [facility];  Service: Ophthalmology;  Laterality: Right;  CDE=18.53  . CATARACT EXTRACTION W/PHACO  08/25/2011   Procedure: CATARACT EXTRACTION PHACO AND INTRAOCULAR LENS PLACEMENT (IOC);  Surgeon: Tonny Branch;  Location: [facility];  Service: Ophthalmology;  Laterality: Left;  CDE:14.76  . TONSILLECTOMY      Allergies  Allergen Reactions  . Bee Venom Anaphylaxis  . Angiotensin Receptor Blockers     Tongue swelling.  . Aspirin     Directed by physician  . Cabbage Other (See Comments)    Certain foods due to gout flares  . Latex Rash    Outpatient Encounter Medications as of 05/07/2018  Medication Sig  . acetaminophen (TYLENOL) 500 MG tablet Take 1,000 mg by mouth 2 (two) times daily. For leg pain and arthritis. May have  additional 1000 mg for break through pain  . amLODipine (NORVASC) 5 MG tablet Take 5 mg by mouth daily.  Roseanne Kaufman Peru-Castor Oil (VENELEX) OINT Apply to bilateral buttocks, sacrum and coccyx q shift and prn incontinence for protection and moisture related skin irritation  . Brinzolamide-Brimonidine (SIMBRINZA) 1-0.2 % SUSP Apply to eye. One  drop into both eyes three times a day  . Calcium Carbonate-Vitamin D (CALCIUM-VITAMIN D) 500-200 MG-UNIT tablet Take 1 tablet by mouth 2 (two) times daily.  . citalopram (CELEXA) 20 MG tablet Take 20 mg by mouth daily.  . cycloSPORINE (RESTASIS) 0.05 % ophthalmic emulsion Place 1 drop into both eyes 2 (two) times daily.  Marland Kitchen docusate sodium (COLACE) 100 MG capsule Take 100 mg by mouth 2 (two) times daily.  Marland Kitchen donepezil (ARICEPT) 10 MG tablet Take 5 mg by mouth daily. For dementia  . hydrALAZINE (APRESOLINE) 25 MG tablet Take 1 1/2 tab to= 37.5 mg by mouth at bedtime  . hydrALAZINE (APRESOLINE) 25 MG tablet Take 25 mg by mouth every morning.  . hydrochlorothiazide (HYDRODIURIL) 12.5 MG tablet Take 12.5 mg by mouth daily.  Marland Kitchen ipratropium-albuterol (DUONEB) 0.5-2.5 (3) MG/3ML SOLN Take 3 mLs by nebulization every 6 (six) hours as needed.  . loratadine (CLARITIN) 10 MG tablet Take 10 mg by mouth daily.  Marland Kitchen LORazepam (ATIVAN) 0.5 MG tablet Take 1 tablet (0.5 mg total) by mouth 2 (two) times daily.  Marland Kitchen lovastatin (MEVACOR) 40 MG tablet Take 40 mg by mouth at bedtime.  . nitroGLYCERIN (NITROSTAT) 0.4 MG SL tablet Place 0.4 mg under the tongue every 5 (five) minutes as needed for chest pain.   . pantoprazole (PROTONIX) 20 MG tablet Take 20 mg by mouth 2 (two) times daily. Do not crush.   . potassium chloride (K-DUR,KLOR-CON) 10 MEQ tablet Take 10 mEq by mouth daily.  . Travoprost, BAK Free, (TRAVATAN) 0.004 % SOLN ophthalmic solution Place 1 drop into both eyes at bedtime. For glaucoma  . [DISCONTINUED] guaiFENesin (MUCINEX) 600 MG 12 hr tablet Take 600 mg by mouth 2  (two) times daily.   No facility-administered encounter medications on file as of 05/07/2018.     Review of Systems   In general she is not complaining of fever or chills.  Skin does have venous stasis changes lower extremities but is not complaining of rashes or itching or diaphoresis.  Head ears eyes nose mouth and throat is not complaining of sore throat or visual changes.  Respiratory denies shortness of breath or increased cough from baseline.  Cardiac does not complain of chest pain does have chronic lower extremity edema-venous stasis changes.  GI is not of abdominal pain nausea vomiting diarrhea constipation.  Musculoskeletal is complaining of more left leg hip pain this actually appears to be possibly more upper leg although this is kind of difficult to localize   Neurologic is  not complain of dizziness headache or numbness.  Psych does have a history of anxiety with depression this appears to be variable a bit anxious today and apparently did staff over the weekend saying that she was in her 90s was tired     Immunization History  Administered Date(s) Administered  . Influenza Whole 06/11/2007, 10/16/2008, 05/21/2009, 05/05/2010  . Influenza-Unspecified 06/05/2014, 06/02/2016, 06/02/2017, 06/02/2017  . Pneumococcal Conjugate-13 06/06/2016  . Pneumococcal Polysaccharide-23 06/11/2007, 06/01/2016, 03/04/2018  . Pneumococcal-Unspecified 06/06/2016  . Td 06/11/2007  . Tdap 06/24/2017  . Zoster 05/21/2007   Pertinent  Health Maintenance Due  Topic Date Due  . INFLUENZA VACCINE  06/06/2018 (Originally 03/29/2018)  . PNA vac Low Risk Adult (2 of 2 - PCV13) 03/05/2019  . DEXA SCAN  Completed   Fall Risk  05/22/2017  Falls in the past year? No   Functional Status Survey:    Vitals:   05/07/18 1357  BP: (!) 172/85  Pulse: 65  Resp: 16  Temp: 98.7 F (37.1 C)  TempSrc: Oral  SpO2: 92%  Weight is 175 pounds-- Manual blood pressure was 142/60 Physical  Exam General this is a pleasant female in no distress she is a little bit anxious.  Skin is warm and dry she has chronic venous stasis edema with scaling lower extremities this appears baseline.  Eyes visual acuity appears grossly intact sclera and conjunctive are clear.  Oropharynx is clear mucous membranes moist.  Chest is clear to auscultation today there is no labored breathing.  Heart is regular rate and rhythm without murmur gallop or rub it appears her edema is relatively baseline with previous exams I  do not see any unilateral increases in either leg--pedal pulses are reduced bilaterally most likely because of the edema.  Abdomen is soft obese nontender with positive bowel sounds.  Musculoskeletal appears able to move all extremities x4 but says she cannot walk because of her left leg- she attempts to stand but needs assistance.  Flexion and extension at the knee does not appear to be reduced from previous exams- flexion and extension of the hip possibly elicits some pain on the left although when distracted and she performs this I do not really see grimacing  I do not see any significant deformities of the left hip or leg. Sitting in the wheelchair she appears to move her left leg with baseline strength but she says it is feeling weaker and she is hesitant to try walking  Neurologic as noted above but could not really appreciate lateralizing findings strength appears to be intact in all 4 extremities  Psych she is pleasant and appropriate a bit anxious but at baseline- does not appear overtly depressed today but is somewhat anxious about her leg--   Labs reviewed: Recent Labs    03/05/18 0515  03/15/18 0700 03/23/18 0700 04/09/18 0600  NA 140   < > 140 140 143  K 4.0   < > 3.8 3.8 3.8  CL 102   < > 104 103 106  CO2 30   < > 28 29 29   GLUCOSE 184*   < > 98 95 90  BUN 29*   < > 45* 42* 42*  CREATININE 1.05*   < > 1.35* 1.52* 1.24*  CALCIUM 8.6*   < > 8.9 9.2 9.4  MG  2.2  --   --   --   --    < > = values in this interval not displayed.   Recent Labs  10/06/17 0743 03/03/18 1008  AST 20 19  ALT 12* 12  ALKPHOS 74 65  BILITOT 0.5 0.7  PROT 7.6 7.0  ALBUMIN 3.9 3.5   Recent Labs    10/06/17 0743  03/03/18 1008 03/04/18 0703 03/15/18 0700  WBC 3.9*   < > 5.3 3.4* 7.3  NEUTROABS 2.0  --  4.1  --  3.9  HGB 12.0   < > 10.2* 9.8* 11.6*  HCT 38.4   < > 31.9* 31.7* 36.1  MCV 83.8   < > 85.5 86.1 83.2  PLT 174   < > 174 166 249   < > = values in this interval not displayed.   Lab Results  Component Value Date   TSH 0.369 03/05/2018   Lab Results  Component Value Date   HGBA1C 5.5 03/20/2017   Lab Results  Component Value Date   CHOL 234 (H) 09/22/2017   HDL 122 09/22/2017   LDLCALC 99 09/22/2017   TRIG 65 09/22/2017   CHOLHDL 1.9 09/22/2017    Significant Diagnostic Results in last 30 days:  No results found.  Assessment/Plan   #1- history of left leg hip discomfort- will obtain an x-ray of the area and make her nonweightbearing until results are known- could not really appreciate any changes on physical exam- patient is fairly baseline but she is insistent that she feels weaker and she is now hesitant to walk with therapy because of it-.  Will await x-ray results nonweightbearing until results are received. She  does have Tylenol routinely and PRN for pain  2.  History of leg swelling this appears relatively baseline she is on the upper end of her baseline weight wise but edema appears fairly stable-again she has refused Lasix-but is on low-dose hydrochlorothiazide- at this point will monitor   #3 hypertension- continues to have somewhat variable blood pressures 142/60 manually was seen earlier today had a systolic in the 759F-  does have elevated blood pressures at times but this does not appear to be consistent--  previously had blood pressures 125/77-134/58- 113/40- I do see occasional 150  Plus   but this is not consistent  at this point will continues on hydralazine twice a day as well as Norvasc once a day  #4 depression-she continues on Celexa she is also on Ativan for anxiety-appears relatively at baseline today but does make comments which indicate depression sometimes-- we will order a psych follow-up   CPT--[id number]

## 2018-05-08 DIAGNOSIS — M79652 Pain in left thigh: Secondary | ICD-10-CM | POA: Diagnosis not present

## 2018-05-08 DIAGNOSIS — M25572 Pain in left ankle and joints of left foot: Secondary | ICD-10-CM | POA: Diagnosis not present

## 2018-05-08 DIAGNOSIS — M79672 Pain in left foot: Secondary | ICD-10-CM | POA: Diagnosis not present

## 2018-05-08 DIAGNOSIS — M25552 Pain in left hip: Secondary | ICD-10-CM | POA: Diagnosis not present

## 2018-05-08 DIAGNOSIS — M25562 Pain in left knee: Secondary | ICD-10-CM | POA: Diagnosis not present

## 2018-05-08 DIAGNOSIS — M79662 Pain in left lower leg: Secondary | ICD-10-CM | POA: Diagnosis not present

## 2018-05-14 ENCOUNTER — Other Ambulatory Visit: Payer: Self-pay

## 2018-05-14 MED ORDER — LORAZEPAM 0.5 MG PO TABS: 0.5000 mg | ORAL_TABLET | Freq: Two times a day (BID) | ORAL | 0 refills | Status: DC

## 2018-05-14 NOTE — Telephone Encounter (Signed)
RX Fax for [facility]@ [phone]  

## 2018-05-15 DIAGNOSIS — F4310 Post-traumatic stress disorder, unspecified: Secondary | ICD-10-CM | POA: Diagnosis not present

## 2018-05-15 DIAGNOSIS — F411 Generalized anxiety disorder: Secondary | ICD-10-CM | POA: Diagnosis not present

## 2018-05-15 DIAGNOSIS — F320 Major depressive disorder, single episode, mild: Secondary | ICD-10-CM | POA: Diagnosis not present

## 2018-05-15 DIAGNOSIS — F428 Other obsessive-compulsive disorder: Secondary | ICD-10-CM | POA: Diagnosis not present

## 2018-05-22 DIAGNOSIS — F320 Major depressive disorder, single episode, mild: Secondary | ICD-10-CM | POA: Diagnosis not present

## 2018-05-22 DIAGNOSIS — F4310 Post-traumatic stress disorder, unspecified: Secondary | ICD-10-CM | POA: Diagnosis not present

## 2018-05-22 DIAGNOSIS — F428 Other obsessive-compulsive disorder: Secondary | ICD-10-CM | POA: Diagnosis not present

## 2018-05-22 DIAGNOSIS — F411 Generalized anxiety disorder: Secondary | ICD-10-CM | POA: Diagnosis not present

## 2018-05-26 DIAGNOSIS — F0280 Dementia in other diseases classified elsewhere without behavioral disturbance: Secondary | ICD-10-CM | POA: Diagnosis not present

## 2018-05-26 DIAGNOSIS — F411 Generalized anxiety disorder: Secondary | ICD-10-CM | POA: Diagnosis not present

## 2018-05-29 DIAGNOSIS — F428 Other obsessive-compulsive disorder: Secondary | ICD-10-CM | POA: Diagnosis not present

## 2018-05-29 DIAGNOSIS — F320 Major depressive disorder, single episode, mild: Secondary | ICD-10-CM | POA: Diagnosis not present

## 2018-05-29 DIAGNOSIS — F4310 Post-traumatic stress disorder, unspecified: Secondary | ICD-10-CM | POA: Diagnosis not present

## 2018-05-29 DIAGNOSIS — F411 Generalized anxiety disorder: Secondary | ICD-10-CM | POA: Diagnosis not present

## 2018-05-31 ENCOUNTER — Non-Acute Institutional Stay (SKILLED_NURSING_FACILITY): Payer: Medicare Other

## 2018-05-31 DIAGNOSIS — Z0000 Encounter for general adult medical examination without abnormal findings: Secondary | ICD-10-CM | POA: Diagnosis not present

## 2018-05-31 NOTE — Progress Notes (Signed)
Subjective:   BURNADETTE BASKETT is a 84 y.o. female who presents for Medicare Annual (Subsequent) preventive examination at [facility]  Last AWV-05/22/2017       Objective:     Vitals: BP 138/65 (BP Location: Right Arm, Patient Position: Sitting)   Pulse 63   Temp 98.2 F (36.8 C) (Oral)   Ht 4\' 11"  (1.499 m)   Wt 169 lb (76.7 kg)   BMI 34.13 kg/m   Body mass index is 34.13 kg/m.  Advanced Directives 05/31/2018 05/07/2018 03/08/2018 03/13/2018 03/03/2018 03/02/2018 01/23/2018  Does Patient Have a Medical Advance Directive? Yes Yes Yes Yes No Yes Yes  Type of Advance Directive Out of facility DNR (pink MOST or yellow form) Out of facility DNR (pink MOST or yellow form) Out of facility DNR (pink MOST or yellow form) Out of facility DNR (pink MOST or yellow form) - Out of facility DNR (pink MOST or yellow form) Out of facility DNR (pink MOST or yellow form)  Does patient want to make changes to medical advance directive? No - Patient declined No - Patient declined No - Patient declined No - Patient declined No - Patient declined No - Patient declined No - Patient declined  Copy of [place] in Chart? - No - copy requested No - copy requested No - copy requested - No - copy requested No - copy requested  Would patient like information on creating a medical advance directive? - No - Patient declined No - Patient declined No - Patient declined No - Patient declined - -  Pre-existing out of facility DNR order (yellow form or pink MOST form) Yellow form placed in chart (order not valid for inpatient use) - - - - - -    Tobacco Social History   Tobacco Use  Smoking Status Never Smoker  Smokeless Tobacco Never Used     Counseling given: Not Answered   Clinical Intake:  Pre-visit preparation completed: No  Pain : 0-10 Pain Score: 10-Worst pain ever Pain Type: Chronic pain Pain Location: Leg Pain Orientation: Right, Left Pain Descriptors /  Indicators: Aching Pain Onset: More than a month ago Pain Frequency: Intermittent     Diabetes: No  How often do you need to have someone help you when you read instructions, pamphlets, or other written materials from your doctor or pharmacy?: 3 - Sometimes What is the last grade level you completed in school?: 8th grade  Interpreter Needed?: No  Information entered by :: Tyson Dense, RN  Past Medical History:  Diagnosis Date  . Anxiety   . Cellulitis 2016  . CHF (congestive heart failure) ([place])   . Chronic kidney disease   . Dementia ([place])   . Dysphagia   . Gait abnormality   . GERD (gastroesophageal reflux disease)   . Glaucoma   . Gout   . Hyperlipidemia   . Hypertension   . Muscle weakness   . Myocardial infarction [facility]) [place]  . Osteoarthritis   . Syncope    Past Surgical History:  Procedure Laterality Date  . APPENDECTOMY    . CATARACT EXTRACTION W/PHACO  08/04/2011   Procedure: CATARACT EXTRACTION PHACO AND INTRAOCULAR LENS PLACEMENT (IOC);  Surgeon: Tonny Branch;  Location: [facility];  Service: Ophthalmology;  Laterality: Right;  CDE=18.53  . CATARACT EXTRACTION W/PHACO  08/25/2011   Procedure: CATARACT EXTRACTION PHACO AND INTRAOCULAR LENS PLACEMENT (IOC);  Surgeon: Tonny Branch;  Location: [facility];  Service: Ophthalmology;  Laterality: Left;  CDE:14.76  . TONSILLECTOMY     Family History  Problem Relation Age of Onset  . Diabetes Father   . Diabetes Sister   . Diabetes Brother   . Stroke Sister   . Stroke Sister   . Anesthesia problems Neg Hx   . Hypotension Neg Hx   . Malignant hyperthermia Neg Hx   . Pseudochol deficiency Neg Hx    Social History   Socioeconomic History  . Marital status: Divorced    Spouse name: Not on file  . Number of children: Not on file  . Years of education: Not on file  . Highest education level: Not on file  Occupational History  . Occupation: retired   Scientific laboratory technician  . Financial resource strain: Not hard at  all  . Food insecurity:    Worry: Never true    Inability: Never true  . Transportation needs:    Medical: No    Non-medical: No  Tobacco Use  . Smoking status: Never Smoker  . Smokeless tobacco: Never Used  Substance and Sexual Activity  . Alcohol use: No  . Drug use: No  . Sexual activity: Not Currently  Lifestyle  . Physical activity:    Days per week: 0 days    Minutes per session: 0 min  . Stress: Only a little  Relationships  . Social connections:    Talks on phone: More than three times a week    Gets together: More than three times a week    Attends religious service: Never    Active member of club or organization: No    Attends meetings of clubs or organizations: Never    Relationship status: Divorced  Other Topics Concern  . Not on file  Social History Narrative  . Not on file    Outpatient Encounter Medications as of 05/31/2018  Medication Sig  . acetaminophen (TYLENOL) 500 MG tablet Take 1,000 mg by mouth 2 (two) times daily. For leg pain and arthritis. May have additional 1000 mg for break through pain  . amLODipine (NORVASC) 5 MG tablet Take 5 mg by mouth daily.  Roseanne Kaufman Peru-Castor Oil (VENELEX) OINT Apply to bilateral buttocks, sacrum and coccyx q shift and prn incontinence for protection and moisture related skin irritation  . Brinzolamide-Brimonidine (SIMBRINZA) 1-0.2 % SUSP Apply to eye. One  drop into both eyes three times a day  . Calcium Carbonate-Vitamin D (CALCIUM-VITAMIN D) 500-200 MG-UNIT tablet Take 1 tablet by mouth 2 (two) times daily.  . citalopram (CELEXA) 20 MG tablet Take 20 mg by mouth daily.  . cycloSPORINE (RESTASIS) 0.05 % ophthalmic emulsion Place 1 drop into both eyes 2 (two) times daily.  Marland Kitchen docusate sodium (COLACE) 100 MG capsule Take 100 mg by mouth 2 (two) times daily.  Marland Kitchen donepezil (ARICEPT) 10 MG tablet Take 5 mg by mouth daily. For dementia  . hydrALAZINE (APRESOLINE) 25 MG tablet Take 1 1/2 tab to= 37.5 mg by mouth at bedtime    . hydrALAZINE (APRESOLINE) 25 MG tablet Take 25 mg by mouth every morning.  . hydrochlorothiazide (HYDRODIURIL) 12.5 MG tablet Take 12.5 mg by mouth daily.  Marland Kitchen ipratropium-albuterol (DUONEB) 0.5-2.5 (3) MG/3ML SOLN Take 3 mLs by nebulization every 6 (six) hours as needed.  . loratadine (CLARITIN) 10 MG tablet Take 10 mg by mouth daily.  Marland Kitchen LORazepam (ATIVAN) 0.5 MG tablet Take 1 tablet (0.5 mg total) by mouth 2 (two) times daily.  Marland Kitchen lovastatin (MEVACOR) 40  MG tablet Take 40 mg by mouth at bedtime.  . nitroGLYCERIN (NITROSTAT) 0.4 MG SL tablet Place 0.4 mg under the tongue every 5 (five) minutes as needed for chest pain.   . pantoprazole (PROTONIX) 20 MG tablet Take 20 mg by mouth 2 (two) times daily. Do not crush.   . potassium chloride (K-DUR,KLOR-CON) 10 MEQ tablet Take 10 mEq by mouth daily.  . Travoprost, BAK Free, (TRAVATAN) 0.004 % SOLN ophthalmic solution Place 1 drop into both eyes at bedtime. For glaucoma   No facility-administered encounter medications on file as of 05/31/2018.     Activities of Daily Living In your present state of health, do you have any difficulty performing the following activities: 05/31/2018 03/03/2018  Hearing? Y N  Vision? N N  Difficulty concentrating or making decisions? Y N  Walking or climbing stairs? Y N  Dressing or bathing? Y Y  Doing errands, shopping? Y N  Preparing Food and eating ? Y -  Using the Toilet? Y -  In the past six months, have you accidently leaked urine? Y -  Do you have problems with loss of bowel control? Y -  Managing your Medications? Y -  Managing your Finances? Y -  Housekeeping or managing your Housekeeping? Y -  Some recent data might be hidden    Patient Care Team: [name] as PCP - General (Internal Medicine) [name], MD as Consulting Physician (Geriatric Medicine)    Assessment:   This is a routine wellness examination for [place].  Exercise Activities and Dietary recommendations Current  Exercise Habits: The patient does not participate in regular exercise at present, Exercise limited by: orthopedic condition(s);neurologic condition(s)  Goals   None     Fall Risk Fall Risk  05/31/2018 05/22/2017  Falls in the past year? No No   Is the patient's home free of loose throw rugs in walkways, pet beds, electrical cords, etc?   yes      Grab bars in the bathroom? yes      Handrails on the stairs?   yes      Adequate lighting?   yes  Depression Screen PHQ 2/9 Scores 05/31/2018 05/22/2017  PHQ - 2 Score 0 0     Cognitive Function     6CIT Screen 05/31/2018 05/22/2017  What Year? 4 points 0 points  What month? 0 points 0 points  What time? 0 points 0 points  Count back from 20 0 points 0 points  Months in reverse 4 points 0 points  Repeat phrase 4 points 6 points  Total Score 12 6    Immunization History  Administered Date(s) Administered  . Influenza Whole 06/11/2007, 10/16/2008, 05/21/2009, 05/05/2010  . Influenza-Unspecified 06/05/2014, 06/02/2016, 06/02/2017, 06/02/2017  . Pneumococcal Conjugate-13 06/06/2016  . Pneumococcal Polysaccharide-23 06/11/2007, 06/01/2016, 03/04/2018  . Pneumococcal-Unspecified 06/06/2016  . Td 06/11/2007  . Tdap 06/24/2017  . Zoster 05/21/2007    Qualifies for Shingles Vaccine? Not in past records  Screening Tests Health Maintenance  Topic Date Due  . INFLUENZA VACCINE  06/06/2018 (Originally 03/29/2018)  . PNA vac Low Risk Adult (2 of 2 - PCV13) 03/05/2019  . TETANUS/TDAP  06/25/2027  . DEXA SCAN  Completed    Cancer Screenings: Lung: Low Dose CT Chest recommended if Age 61-80 years, 30 pack-year currently smoking OR have quit w/in 15years. Patient does not qualify. Breast:  Up to date on Mammogram? Yes   Up to date of Bone Density/Dexa? Yes Colorectal: up to date  Additional Screenings:  Hepatitis C Screening: declined Flu vaccine due: will receive at [facility]     Plan:  I have personally reviewed and addressed the  Medicare Annual Wellness questionnaire and have noted the following in the patient's chart:  A. Medical and social history B. Use of alcohol, tobacco or illicit drugs  C. Current medications and supplements D. Functional ability and status E.  Nutritional status F.  Physical activity G. Advance directives H. List of other physicians I.  Hospitalizations, surgeries, and ER visits in previous 12 months J.  [place] to include hearing, vision, cognitive, depression L. Referrals and appointments - none  In addition, I have reviewed and discussed with patient certain preventive protocols, quality metrics, and best practice recommendations. A written personalized care plan for preventive services as well as general preventive health recommendations were provided to patient.  See attached scanned questionnaire for additional information.   Signed,   Tyson Dense, RN Nurse Health Advisor  Patient Concerns: None

## 2018-05-31 NOTE — Patient Instructions (Signed)
[name] , Thank you for taking time to come for your Medicare Wellness Visit. I appreciate your ongoing commitment to your health goals. Please review the following plan we discussed and let me know if I can assist you in the future.   Screening recommendations/referrals: Colonoscopy excluded, over age 82 Mammogram excluded, over age 21 Bone Density up to date Recommended yearly ophthalmology/optometry visit for glaucoma screening and checkup Recommended yearly dental visit for hygiene and checkup  Vaccinations: Influenza vaccine due, will receive at [facility] Pneumococcal vaccine up to date, completed Tdap vaccine up to date, due 06/25/2027 Shingles vaccine not in past records    Advanced directives: in chart  Conditions/risks identified: none  Next appointment: Dr. Lyndel Safe makes rounds   Preventive Care 65 Years and Older, Female Preventive care refers to lifestyle choices and visits with your health care provider that can promote health and wellness. What does preventive care include?  A yearly physical exam. This is also called an annual well check.  Dental exams once or twice a year.  Routine eye exams. Ask your health care provider how often you should have your eyes checked.  Personal lifestyle choices, including:  Daily care of your teeth and gums.  Regular physical activity.  Eating a healthy diet.  Avoiding tobacco and drug use.  Limiting alcohol use.  Practicing safe sex.  Taking low-dose aspirin every day.  Taking vitamin and mineral supplements as recommended by your health care provider. What happens during an annual well check? The services and screenings done by your health care provider during your annual well check will depend on your age, overall health, lifestyle risk factors, and family history of disease. Counseling  Your health care provider may ask you questions about your:  Alcohol use.  Tobacco use.  Drug use.  Emotional  well-being.  Home and relationship well-being.  Sexual activity.  Eating habits.  History of falls.  Memory and ability to understand (cognition).  Work and work Statistician.  Reproductive health. Screening  You may have the following tests or measurements:  Height, weight, and BMI.  Blood pressure.  Lipid and cholesterol levels. These may be checked every 5 years, or more frequently if you are over 58 years old.  Skin check.  Lung cancer screening. You may have this screening every year starting at age 65 if you have a 30-pack-year history of smoking and currently smoke or have quit within the past 15 years.  Fecal occult blood test (FOBT) of the stool. You may have this test every year starting at age 49.  Flexible sigmoidoscopy or colonoscopy. You may have a sigmoidoscopy every 5 years or a colonoscopy every 10 years starting at age 81.  Hepatitis C blood test.  Hepatitis B blood test.  Sexually transmitted disease (STD) testing.  Diabetes screening. This is done by checking your blood sugar (glucose) after you have not eaten for a while (fasting). You may have this done every 1-3 years.  Bone density scan. This is done to screen for osteoporosis. You may have this done starting at age 21.  Mammogram. This may be done every 1-2 years. Talk to your health care provider about how often you should have regular mammograms. Talk with your health care provider about your test results, treatment options, and if necessary, the need for more tests. Vaccines  Your health care provider may recommend certain vaccines, such as:  Influenza vaccine. This is recommended every year.  Tetanus, diphtheria, and acellular pertussis (Tdap, Td) vaccine.  You may need a Td booster every 10 years.  Zoster vaccine. You may need this after age 22.  Pneumococcal 13-valent conjugate (PCV13) vaccine. One dose is recommended after age 76.  Pneumococcal polysaccharide (PPSV23) vaccine. One  dose is recommended after age 69. Talk to your health care provider about which screenings and vaccines you need and how often you need them. This information is not intended to replace advice given to you by your health care provider. Make sure you discuss any questions you have with your health care provider. Document Released: 09/11/2015 Document Revised: 05/04/2016 Document Reviewed: 06/16/2015 Elsevier Interactive Patient Education  2017 [place] Prevention in the Home Falls can cause injuries. They can happen to people of all ages. There are many things you can do to make your home safe and to help prevent falls. What can I do on the outside of my home?  Regularly fix the edges of walkways and driveways and fix any cracks.  Remove anything that might make you trip as you walk through a door, such as a raised step or threshold.  Trim any bushes or trees on the path to your home.  Use bright outdoor lighting.  Clear any walking paths of anything that might make someone trip, such as rocks or tools.  Regularly check to see if handrails are loose or broken. Make sure that both sides of any steps have handrails.  Any raised decks and porches should have guardrails on the edges.  Have any leaves, snow, or ice cleared regularly.  Use sand or salt on walking paths during winter.  Clean up any spills in your garage right away. This includes oil or grease spills. What can I do in the bathroom?  Use night lights.  Install grab bars by the toilet and in the tub and shower. Do not use towel bars as grab bars.  Use non-skid mats or decals in the tub or shower.  If you need to sit down in the shower, use a plastic, non-slip stool.  Keep the floor dry. Clean up any water that spills on the floor as soon as it happens.  Remove soap buildup in the tub or shower regularly.  Attach bath mats securely with double-sided non-slip rug tape.  Do not have throw rugs and other  things on the floor that can make you trip. What can I do in the bedroom?  Use night lights.  Make sure that you have a light by your bed that is easy to reach.  Do not use any sheets or blankets that are too big for your bed. They should not hang down onto the floor.  Have a firm chair that has side arms. You can use this for support while you get dressed.  Do not have throw rugs and other things on the floor that can make you trip. What can I do in the kitchen?  Clean up any spills right away.  Avoid walking on wet floors.  Keep items that you use a lot in easy-to-reach places.  If you need to reach something above you, use a strong step stool that has a grab bar.  Keep electrical cords out of the way.  Do not use floor polish or wax that makes floors slippery. If you must use wax, use non-skid floor wax.  Do not have throw rugs and other things on the floor that can make you trip. What can I do with my stairs?  Do not leave any  items on the stairs.  Make sure that there are handrails on both sides of the stairs and use them. Fix handrails that are broken or loose. Make sure that handrails are as long as the stairways.  Check any carpeting to make sure that it is firmly attached to the stairs. Fix any carpet that is loose or worn.  Avoid having throw rugs at the top or bottom of the stairs. If you do have throw rugs, attach them to the floor with carpet tape.  Make sure that you have a light switch at the top of the stairs and the bottom of the stairs. If you do not have them, ask someone to add them for you. What else can I do to help prevent falls?  Wear shoes that:  Do not have high heels.  Have rubber bottoms.  Are comfortable and fit you well.  Are closed at the toe. Do not wear sandals.  If you use a stepladder:  Make sure that it is fully opened. Do not climb a closed stepladder.  Make sure that both sides of the stepladder are locked into place.  Ask  someone to hold it for you, if possible.  Clearly mark and make sure that you can see:  Any grab bars or handrails.  First and last steps.  Where the edge of each step is.  Use tools that help you move around (mobility aids) if they are needed. These include:  Canes.  Walkers.  Scooters.  Crutches.  Turn on the lights when you go into a dark area. Replace any light bulbs as soon as they burn out.  Set up your furniture so you have a clear path. Avoid moving your furniture around.  If any of your floors are uneven, fix them.  If there are any pets around you, be aware of where they are.  Review your medicines with your doctor. Some medicines can make you feel dizzy. This can increase your chance of falling. Ask your doctor what other things that you can do to help prevent falls. This information is not intended to replace advice given to you by your health care provider. Make sure you discuss any questions you have with your health care provider. Document Released: 06/11/2009 Document Revised: 01/21/2016 Document Reviewed: 09/19/2014 Elsevier Interactive Patient Education  2017 [employer].

## 2018-06-05 DIAGNOSIS — F411 Generalized anxiety disorder: Secondary | ICD-10-CM | POA: Diagnosis not present

## 2018-06-05 DIAGNOSIS — F4310 Post-traumatic stress disorder, unspecified: Secondary | ICD-10-CM | POA: Diagnosis not present

## 2018-06-05 DIAGNOSIS — F320 Major depressive disorder, single episode, mild: Secondary | ICD-10-CM | POA: Diagnosis not present

## 2018-06-05 DIAGNOSIS — F428 Other obsessive-compulsive disorder: Secondary | ICD-10-CM | POA: Diagnosis not present

## 2018-06-12 ENCOUNTER — Other Ambulatory Visit: Payer: Self-pay

## 2018-06-12 DIAGNOSIS — F428 Other obsessive-compulsive disorder: Secondary | ICD-10-CM | POA: Diagnosis not present

## 2018-06-12 DIAGNOSIS — F4310 Post-traumatic stress disorder, unspecified: Secondary | ICD-10-CM | POA: Diagnosis not present

## 2018-06-12 DIAGNOSIS — F320 Major depressive disorder, single episode, mild: Secondary | ICD-10-CM | POA: Diagnosis not present

## 2018-06-12 DIAGNOSIS — F411 Generalized anxiety disorder: Secondary | ICD-10-CM | POA: Diagnosis not present

## 2018-06-12 MED ORDER — LORAZEPAM 0.5 MG PO TABS: 0.5000 mg | ORAL_TABLET | Freq: Two times a day (BID) | ORAL | 0 refills | Status: DC

## 2018-06-12 NOTE — Telephone Encounter (Signed)
RX Fax for [facility]@ [phone]  

## 2018-06-13 DIAGNOSIS — M79675 Pain in left toe(s): Secondary | ICD-10-CM | POA: Diagnosis not present

## 2018-06-13 DIAGNOSIS — I739 Peripheral vascular disease, unspecified: Secondary | ICD-10-CM | POA: Diagnosis not present

## 2018-06-13 DIAGNOSIS — B351 Tinea unguium: Secondary | ICD-10-CM | POA: Diagnosis not present

## 2018-06-13 DIAGNOSIS — M79674 Pain in right toe(s): Secondary | ICD-10-CM | POA: Diagnosis not present

## 2018-06-19 DIAGNOSIS — F320 Major depressive disorder, single episode, mild: Secondary | ICD-10-CM | POA: Diagnosis not present

## 2018-06-19 DIAGNOSIS — F4310 Post-traumatic stress disorder, unspecified: Secondary | ICD-10-CM | POA: Diagnosis not present

## 2018-06-19 DIAGNOSIS — F411 Generalized anxiety disorder: Secondary | ICD-10-CM | POA: Diagnosis not present

## 2018-06-19 DIAGNOSIS — F428 Other obsessive-compulsive disorder: Secondary | ICD-10-CM | POA: Diagnosis not present

## 2018-06-25 DIAGNOSIS — H04123 Dry eye syndrome of bilateral lacrimal glands: Secondary | ICD-10-CM | POA: Diagnosis not present

## 2018-06-25 DIAGNOSIS — H401134 Primary open-angle glaucoma, bilateral, indeterminate stage: Secondary | ICD-10-CM | POA: Diagnosis not present

## 2018-06-25 DIAGNOSIS — Z961 Presence of intraocular lens: Secondary | ICD-10-CM | POA: Diagnosis not present

## 2018-06-26 DIAGNOSIS — F428 Other obsessive-compulsive disorder: Secondary | ICD-10-CM | POA: Diagnosis not present

## 2018-06-26 DIAGNOSIS — F411 Generalized anxiety disorder: Secondary | ICD-10-CM | POA: Diagnosis not present

## 2018-06-26 DIAGNOSIS — F4310 Post-traumatic stress disorder, unspecified: Secondary | ICD-10-CM | POA: Diagnosis not present

## 2018-06-26 DIAGNOSIS — F320 Major depressive disorder, single episode, mild: Secondary | ICD-10-CM | POA: Diagnosis not present

## 2018-06-28 ENCOUNTER — Encounter: Payer: Self-pay | Admitting: Internal Medicine

## 2018-06-28 ENCOUNTER — Non-Acute Institutional Stay (SKILLED_NURSING_FACILITY): Payer: Medicare Other | Admitting: Internal Medicine

## 2018-06-28 DIAGNOSIS — I10 Essential (primary) hypertension: Secondary | ICD-10-CM | POA: Diagnosis not present

## 2018-06-28 DIAGNOSIS — I5032 Chronic diastolic (congestive) heart failure: Secondary | ICD-10-CM

## 2018-06-28 DIAGNOSIS — N183 Chronic kidney disease, stage 3 unspecified: Secondary | ICD-10-CM

## 2018-06-28 DIAGNOSIS — E785 Hyperlipidemia, unspecified: Secondary | ICD-10-CM

## 2018-06-28 NOTE — Progress Notes (Signed)
Location:    [facility] Room Number: 122/D Place of Service:  SNF [phone]) Provider: Veleta Miners MD  Wille Celeste, PA-C  Patient Care Team: [name] as PCP - General (Internal Medicine) [name], MD as Consulting Physician (Geriatric Medicine)  Extended Emergency Contact Information Primary Emergency Contact: [name], [place] [address] [name] of [place] Phone: [phone] Relation: Daughter Secondary Emergency Contact: [name] [place] of [place] Phone: [phone] Relation: Son  Code Status:  DNR Goals of care: Advanced Directive information Advanced Directives 06/28/2018  Does Patient Have a Medical Advance Directive? Yes  Type of Advance Directive Out of facility DNR (pink MOST or yellow form)  Does patient want to make changes to medical advance directive? No - Patient declined  Copy of [place] in Chart? No - copy requested  Would patient like information on creating a medical advance directive? No - Patient declined  Pre-existing out of facility DNR order (yellow form or pink MOST form) -     Chief Complaint  Patient presents with  . Medical Management of Chronic Issues    Routine visit of medical management    HPI:  Pt is a 84 y.o. female seen today for medical management of chronic diseases.    Patient has H/o Hypertension, Distal DVT, Chronic venous stasis,  Diastolic CHF chronic Renal disaese and GERD,Glaucoma,Dementia and Anxiety Patient is a long-term resident of facility She has been stable in the facility recently. She did not have any new complaints today.  Her weight is up from 167 lbs at 176 pounds.  He has good appetite.  Denied any chest pain, cough or any shortness of breath.  No new nursing issues  Past Medical History:  Diagnosis Date  . Anxiety   . Cellulitis 2016  . CHF (congestive heart failure) ([place])   . Chronic kidney disease    . Dementia ([place])   . Dysphagia   . Gait abnormality   . GERD (gastroesophageal reflux disease)   . Glaucoma   . Gout   . Hyperlipidemia   . Hypertension   . Muscle weakness   . Myocardial infarction [facility]) [place]  . Osteoarthritis   . Syncope    Past Surgical History:  Procedure Laterality Date  . APPENDECTOMY    . CATARACT EXTRACTION W/PHACO  08/04/2011   Procedure: CATARACT EXTRACTION PHACO AND INTRAOCULAR LENS PLACEMENT (IOC);  Surgeon: Tonny Branch;  Location: [facility];  Service: Ophthalmology;  Laterality: Right;  CDE=18.53  . CATARACT EXTRACTION W/PHACO  08/25/2011   Procedure: CATARACT EXTRACTION PHACO AND INTRAOCULAR LENS PLACEMENT (IOC);  Surgeon: Tonny Branch;  Location: [facility];  Service: Ophthalmology;  Laterality: Left;  CDE:14.76  . TONSILLECTOMY      Allergies  Allergen Reactions  . Bee Venom Anaphylaxis  . Angiotensin Receptor Blockers     Tongue swelling.  . Aspirin     Directed by physician  . Cabbage Other (See Comments)    Certain foods due to gout flares  . Latex Rash    Outpatient Encounter Medications as of 06/28/2018  Medication Sig  . acetaminophen (TYLENOL) 500 MG tablet Take 1,000 mg by mouth 2 (two) times daily. For leg pain and arthritis. May have additional 1000 mg for break through pain  . amLODipine (NORVASC) 5 MG tablet Take 5 mg by mouth daily.  . Brinzolamide-Brimonidine (SIMBRINZA) 1-0.2 % SUSP  Apply to eye. One  drop into both eyes three times a day  . Calcium Carbonate-Vitamin D (CALCIUM-VITAMIN D) 500-200 MG-UNIT tablet Take 1 tablet by mouth 2 (two) times daily.  . citalopram (CELEXA) 20 MG tablet Take 20 mg by mouth daily.  . cycloSPORINE (RESTASIS) 0.05 % ophthalmic emulsion Place 1 drop into both eyes 2 (two) times daily.  Marland Kitchen docusate sodium (COLACE) 100 MG capsule Take 100 mg by mouth 2 (two) times daily.  Marland Kitchen donepezil (ARICEPT) 10 MG tablet Take 5 mg by mouth daily. For dementia  . hydrALAZINE (APRESOLINE) 25 MG tablet Take 1  1/2 tab to= 37.5 mg by mouth at bedtime  . hydrALAZINE (APRESOLINE) 25 MG tablet Take 25 mg by mouth every morning.  . hydrochlorothiazide (HYDRODIURIL) 12.5 MG tablet Take 12.5 mg by mouth daily.  Marland Kitchen ipratropium-albuterol (DUONEB) 0.5-2.5 (3) MG/3ML SOLN Take 3 mLs by nebulization every 6 (six) hours as needed.  . loratadine (CLARITIN) 10 MG tablet Take 10 mg by mouth daily.  Marland Kitchen LORazepam (ATIVAN) 0.5 MG tablet Take 1 tablet (0.5 mg total) by mouth 2 (two) times daily.  Marland Kitchen lovastatin (MEVACOR) 40 MG tablet Take 40 mg by mouth at bedtime.  . nitroGLYCERIN (NITROSTAT) 0.4 MG SL tablet Place 0.4 mg under the tongue every 5 (five) minutes as needed for chest pain.   . pantoprazole (PROTONIX) 20 MG tablet Take 20 mg by mouth 2 (two) times daily. Do not crush.   . potassium chloride (K-DUR,KLOR-CON) 10 MEQ tablet Take 10 mEq by mouth daily.  . Travoprost, BAK Free, (TRAVATAN) 0.004 % SOLN ophthalmic solution Place 1 drop into both eyes at bedtime. For glaucoma  . [DISCONTINUED] Balsam [place]-Castor Oil (VENELEX) OINT Apply to bilateral buttocks, sacrum and coccyx q shift and prn incontinence for protection and moisture related skin irritation   No facility-administered encounter medications on file as of 06/28/2018.      Review of Systems  Review of Systems  Constitutional: Negative for activity change, appetite change, chills, diaphoresis, fatigue and fever.  HENT: Negative for mouth sores, postnasal drip, rhinorrhea, sinus pain and sore throat.   Respiratory: Negative for apnea, cough, chest tightness, shortness of breath and wheezing.   Cardiovascular: Negative for chest pain, palpitations and leg swelling.  Gastrointestinal: Negative for abdominal distention, abdominal pain, constipation, diarrhea, nausea and vomiting.  Genitourinary: Negative for dysuria and frequency.  Musculoskeletal: Negative for arthralgias, joint swelling and myalgias.  Skin: Negative for rash.  Neurological: Negative for  dizziness, syncope, weakness, light-headedness and numbness.  Psychiatric/Behavioral: Negative for behavioral problems, confusion and sleep disturbance.     Immunization History  Administered Date(s) Administered  . Influenza Whole 06/11/2007, 10/16/2008, 05/21/2009, 05/05/2010  . Influenza-Unspecified 06/05/2014, 06/02/2016, 06/02/2017, 06/02/2017  . Pneumococcal Conjugate-13 06/06/2016  . Pneumococcal Polysaccharide-23 06/11/2007, 06/01/2016, 03/04/2018  . Pneumococcal-Unspecified 06/06/2016  . Td 06/11/2007  . Tdap 06/24/2017  . Zoster 05/21/2007   Pertinent  Health Maintenance Due  Topic Date Due  . PNA vac Low Risk Adult (2 of 2 - PCV13) 03/05/2019  . INFLUENZA VACCINE  Completed  . DEXA SCAN  Completed   Fall Risk  05/31/2018 05/22/2017  Falls in the past year? No No   Functional Status Survey:    Vitals:   06/28/18 1421  BP: (!) 177/73  Pulse: 61  Resp: 16  Temp: 97.8 F (36.6 C)  TempSrc: Oral  SpO2: 95%  Weight: 175 lb (79.4 kg)  Height: 4\' 11"  (1.499 m)   Body mass index is 35.35  kg/m. Physical Exam  Constitutional: She is oriented to person, place, and time. She appears well-developed and well-nourished.  HENT:  Head: Normocephalic.  Mouth/Throat: Oropharynx is clear and moist.  Eyes: Pupils are equal, round, and reactive to light.  Neck: Neck supple.  Cardiovascular: Normal rate and regular rhythm.  Pulmonary/Chest: Effort normal and breath sounds normal. No stridor. No respiratory distress. She has no wheezes.  Abdominal: Soft. Bowel sounds are normal. She exhibits no distension. There is no tenderness. There is no guarding.  Musculoskeletal:  Mild edema Bilateral and Chronic Venous Changes bilateral  Neurological: She is alert and oriented to person, place, and time.  No Focal deficits Patient walks with Assist and her Walker  Skin: Skin is warm and dry.  Psychiatric: She has a normal mood and affect. Her behavior is normal. Thought content  normal.    Labs reviewed: Recent Labs    03/05/18 0515  03/15/18 0700 03/23/18 0700 04/09/18 0600  NA 140   < > 140 140 143  K 4.0   < > 3.8 3.8 3.8  CL 102   < > 104 103 106  CO2 30   < > 28 29 29   GLUCOSE 184*   < > 98 95 90  BUN 29*   < > 45* 42* 42*  CREATININE 1.05*   < > 1.35* 1.52* 1.24*  CALCIUM 8.6*   < > 8.9 9.2 9.4  MG 2.2  --   --   --   --    < > = values in this interval not displayed.   Recent Labs    10/06/17 0743 03/03/18 1008  AST 20 19  ALT 12* 12  ALKPHOS 74 65  BILITOT 0.5 0.7  PROT 7.6 7.0  ALBUMIN 3.9 3.5   Recent Labs    10/06/17 0743  03/03/18 1008 03/04/18 0703 03/15/18 0700  WBC 3.9*   < > 5.3 3.4* 7.3  NEUTROABS 2.0  --  4.1  --  3.9  HGB 12.0   < > 10.2* 9.8* 11.6*  HCT 38.4   < > 31.9* 31.7* 36.1  MCV 83.8   < > 85.5 86.1 83.2  PLT 174   < > 174 166 249   < > = values in this interval not displayed.   Lab Results  Component Value Date   TSH 0.369 03/05/2018   Lab Results  Component Value Date   HGBA1C 5.5 03/20/2017   Lab Results  Component Value Date   CHOL 234 (H) 09/22/2017   HDL 122 09/22/2017   LDLCALC 99 09/22/2017   TRIG 65 09/22/2017   CHOLHDL 1.9 09/22/2017    Significant Diagnostic Results in last 30 days:  No results found.  Assessment/Plan  Essential Hypertension She has very Labile BP with swings of SBP of 180-120 But recently it has been staying high On Hydralazine and HCTZ and Norvasc Will increase her Hydralazine to 37.5 Mg BID  Chronic diastolic congestive heart failure She does have some swelling in her legs. Has Gained some weight but asymptomatic She continues to refuse Lasix Continue on HCTZ  Stage 3 chronic kidney disease Creatinine is staying stable.Repeat BMP Anxiety state patient is doing well She is on Celexa and Ativan.  Any titration can make her symptoms recur Will also not reduce her Ativan at this time  Hyperlipidemia, LDL 9901/19on lovastatin GERD Change  her Protonix to BID Reducing the dose caused her symptoms to come back Dementia stable on Aricept  Family/ staff Communication:   Labs/tests ordered:

## 2018-06-29 ENCOUNTER — Encounter (HOSPITAL_COMMUNITY)
Admission: RE | Admit: 2018-06-29 | Discharge: 2018-06-29 | Disposition: A | Discharge status: Home / Self Care | Payer: Medicare Other | Source: Skilled Nursing Facility | Attending: Internal Medicine | Admitting: Internal Medicine

## 2018-06-29 DIAGNOSIS — I110 Hypertensive heart disease with heart failure: Secondary | ICD-10-CM | POA: Insufficient documentation

## 2018-06-29 DIAGNOSIS — I5032 Chronic diastolic (congestive) heart failure: Secondary | ICD-10-CM | POA: Diagnosis not present

## 2018-06-29 LAB — CBC
HCT: 36.4 % (ref 36.0–46.0)
HEMOGLOBIN: 11.1 g/dL — AB (ref 12.0–15.0)
MCH: 25.7 pg — ABNORMAL LOW (ref 26.0–34.0)
MCHC: 30.5 g/dL (ref 30.0–36.0)
MCV: 84.3 fL (ref 80.0–100.0)
NRBC: 0 % (ref 0.0–0.2)
Platelets: 224 10*3/uL (ref 150–400)
RBC: 4.32 MIL/uL (ref 3.87–5.11)
RDW: 15.5 % (ref 11.5–15.5)
WBC: 4.1 10*3/uL (ref 4.0–10.5)

## 2018-06-29 LAB — COMPREHENSIVE METABOLIC PANEL
ALBUMIN: 3.9 g/dL (ref 3.5–5.0)
ALK PHOS: 78 U/L (ref 38–126)
ALT: 12 U/L (ref 0–44)
AST: 17 U/L (ref 15–41)
Anion gap: 8 (ref 5–15)
BILIRUBIN TOTAL: 0.7 mg/dL (ref 0.3–1.2)
BUN: 41 mg/dL — AB (ref 8–23)
CO2: 28 mmol/L (ref 22–32)
Calcium: 9.3 mg/dL (ref 8.9–10.3)
Chloride: 104 mmol/L (ref 98–111)
Creatinine, Ser: 1.38 mg/dL — ABNORMAL HIGH (ref 0.44–1.00)
GFR calc Af Amer: 37 mL/min — ABNORMAL LOW (ref >60)
GFR calc non Af Amer: 32 mL/min — ABNORMAL LOW (ref >60)
GLUCOSE: 94 mg/dL (ref 70–99)
POTASSIUM: 3.8 mmol/L (ref 3.5–5.1)
Sodium: 140 mmol/L (ref 135–145)
Total Protein: 7.6 g/dL (ref 6.5–8.1)

## 2018-07-03 DIAGNOSIS — F320 Major depressive disorder, single episode, mild: Secondary | ICD-10-CM | POA: Diagnosis not present

## 2018-07-03 DIAGNOSIS — F428 Other obsessive-compulsive disorder: Secondary | ICD-10-CM | POA: Diagnosis not present

## 2018-07-03 DIAGNOSIS — F411 Generalized anxiety disorder: Secondary | ICD-10-CM | POA: Diagnosis not present

## 2018-07-03 DIAGNOSIS — F4310 Post-traumatic stress disorder, unspecified: Secondary | ICD-10-CM | POA: Diagnosis not present

## 2018-07-16 ENCOUNTER — Other Ambulatory Visit: Payer: Self-pay

## 2018-07-16 MED ORDER — LORAZEPAM 0.5 MG PO TABS: 0.5000 mg | ORAL_TABLET | Freq: Two times a day (BID) | ORAL | 0 refills | Status: DC

## 2018-07-16 NOTE — Telephone Encounter (Signed)
RX Fax for [facility]@ [phone]  

## 2018-07-17 DIAGNOSIS — F4310 Post-traumatic stress disorder, unspecified: Secondary | ICD-10-CM | POA: Diagnosis not present

## 2018-07-17 DIAGNOSIS — F320 Major depressive disorder, single episode, mild: Secondary | ICD-10-CM | POA: Diagnosis not present

## 2018-07-17 DIAGNOSIS — F411 Generalized anxiety disorder: Secondary | ICD-10-CM | POA: Diagnosis not present

## 2018-07-17 DIAGNOSIS — F428 Other obsessive-compulsive disorder: Secondary | ICD-10-CM | POA: Diagnosis not present

## 2018-07-24 DIAGNOSIS — F411 Generalized anxiety disorder: Secondary | ICD-10-CM | POA: Diagnosis not present

## 2018-07-24 DIAGNOSIS — F4310 Post-traumatic stress disorder, unspecified: Secondary | ICD-10-CM | POA: Diagnosis not present

## 2018-07-24 DIAGNOSIS — F428 Other obsessive-compulsive disorder: Secondary | ICD-10-CM | POA: Diagnosis not present

## 2018-07-24 DIAGNOSIS — F320 Major depressive disorder, single episode, mild: Secondary | ICD-10-CM | POA: Diagnosis not present

## 2018-08-01 ENCOUNTER — Encounter: Payer: Self-pay | Admitting: Internal Medicine

## 2018-08-01 ENCOUNTER — Non-Acute Institutional Stay (SKILLED_NURSING_FACILITY): Payer: Medicare Other | Admitting: Internal Medicine

## 2018-08-01 DIAGNOSIS — F0391 Unspecified dementia with behavioral disturbance: Secondary | ICD-10-CM

## 2018-08-01 DIAGNOSIS — I10 Essential (primary) hypertension: Secondary | ICD-10-CM | POA: Diagnosis not present

## 2018-08-01 DIAGNOSIS — K219 Gastro-esophageal reflux disease without esophagitis: Secondary | ICD-10-CM | POA: Diagnosis not present

## 2018-08-01 DIAGNOSIS — N183 Chronic kidney disease, stage 3 unspecified: Secondary | ICD-10-CM

## 2018-08-01 DIAGNOSIS — R0902 Hypoxemia: Secondary | ICD-10-CM

## 2018-08-01 DIAGNOSIS — I5032 Chronic diastolic (congestive) heart failure: Secondary | ICD-10-CM | POA: Diagnosis not present

## 2018-08-01 NOTE — Progress Notes (Signed)
Location:    [facility] Room Number: 122/D Place of Service:  SNF (31) Provider: Granville Lewis PA-C  Wille Celeste, PA-C  Patient Care Team: [name] as PCP - General (Internal Medicine) [name], MD as Consulting Physician (Geriatric Medicine)  Extended Emergency Contact Information Primary Emergency Contact: [name], [place] [address] [name] of [place] Phone: [phone] Relation: Daughter Secondary Emergency Contact: [name] States of [place] Phone: [phone] Relation: Son  Code Status:  DNR Goals of care: Advanced Directive information Advanced Directives 08/01/2018  Does Patient Have a Medical Advance Directive? Yes  Type of Advance Directive Out of facility DNR (pink MOST or yellow form)  Does patient want to make changes to medical advance directive? No - Patient declined  Copy of [place] in Chart? No - copy requested  Would patient like information on creating a medical advance directive? No - Patient declined  Pre-existing out of facility DNR order (yellow form or pink MOST form) -     Chief Complaint  Patient presents with  . Medical Management of Chronic Issues    Routine visit of medical management   For medical management of chronic medical conditions including hypertension-history of distal DVT-chronic venous stasis diastolic CHF chronic renal disease-GERD glaucoma dementia as well as anxiety.    HPI:  Pt is a 84 y.o. female seen today for medical management of chronic diseases.  As noted above she appears to be having a period of relative stability she does have some history of intermittent hypoxia and apparently O2 saturations were in the high 80s last night but with oxygen quickly came up to high 90s actually most recently 100%.  She previously has had a work-up including a CT scan to rule out any pulmonary embolism since she has an elevated  d-dimer but this was negative she also has a history of a left femoral DVT but updated Doppler did not show evidence of this.  One point had been on Eliquis we have wanted to put her up on aspirin subsequently but she says she has an allergy to aspirin and has refused anticoagulation  Nursing staff does not report any increased congestion or cough suspect she does have somewhat of an anxiety component to this as well she is on Ativan twice daily on Celexa for coexistent depression.  She also has a history of diastolic CHF her weight has been relatively stable in the 8 pounds her edema appears to be stable with venous stasis changes chronically she is on hydrochlorothiazide she has refused aspirin she is also on potassium supplementation.  She also has a history of stage III chronic disease recent labs showed a creatinine of 1.38 which is relatively baseline this was done last month.  She also has a history of coma this is stabilized on topical eyedrops she does not complain of any visual changes today.  Regards to GERD she is on Protonix twice daily at one point we did a trial course had a lower dose but did not tolerate this -- brought back to twice daily and appears to be helping   Currently she is sitting in her wheelchair comfortably appears to be a bit anxious    She says at times it feels like there is cold air over her somewhat of a chronic complain   again physical exam appeared to be fairly baseline at times she does  have elevated blood pressures listed at 169/69 today-she recently had her hydralazine increased to 37.5 mg she is also on hydrochlorothiazide 12.5 mg a day in addition to Norvasc 5 mg a day I got a manual reading later this afternoon after medications were administered at 122/58   Past Medical History:  Diagnosis Date  . Anxiety   . Cellulitis 2016  . CHF (congestive heart failure) ([place])   . Chronic kidney disease   . Dementia ([place])   . Dysphagia   . Gait  abnormality   . GERD (gastroesophageal reflux disease)   . Glaucoma   . Gout   . Hyperlipidemia   . Hypertension   . Muscle weakness   . Myocardial infarction [facility]) [place]  . Osteoarthritis   . Syncope    Past Surgical History:  Procedure Laterality Date  . APPENDECTOMY    . CATARACT EXTRACTION W/PHACO  08/04/2011   Procedure: CATARACT EXTRACTION PHACO AND INTRAOCULAR LENS PLACEMENT (IOC);  Surgeon: Tonny Branch;  Location: [facility];  Service: Ophthalmology;  Laterality: Right;  CDE=18.53  . CATARACT EXTRACTION W/PHACO  08/25/2011   Procedure: CATARACT EXTRACTION PHACO AND INTRAOCULAR LENS PLACEMENT (IOC);  Surgeon: Tonny Branch;  Location: [facility];  Service: Ophthalmology;  Laterality: Left;  CDE:14.76  . TONSILLECTOMY      Allergies  Allergen Reactions  . Bee Venom Anaphylaxis  . Angiotensin Receptor Blockers     Tongue swelling.  . Aspirin     Directed by physician  . Cabbage Other (See Comments)    Certain foods due to gout flares  . Latex Rash    Outpatient Encounter Medications as of 08/01/2018  Medication Sig  . acetaminophen (TYLENOL) 500 MG tablet Take 1,000 mg by mouth 2 (two) times daily. For leg pain and arthritis. May have additional 1000 mg for break through pain once a day prn  . amLODipine (NORVASC) 5 MG tablet Take 5 mg by mouth daily.  . Brinzolamide-Brimonidine (SIMBRINZA) 1-0.2 % SUSP Apply to eye. One  drop into both eyes three times a day  . Calcium Carbonate-Vitamin D (CALCIUM-VITAMIN D) 500-200 MG-UNIT tablet Take 1 tablet by mouth 2 (two) times daily.  . citalopram (CELEXA) 20 MG tablet Take 20 mg by mouth daily.  . cycloSPORINE (RESTASIS) 0.05 % ophthalmic emulsion Place 1 drop into both eyes 2 (two) times daily.  Marland Kitchen docusate sodium (COLACE) 100 MG capsule Take 100 mg by mouth 2 (two) times daily.  Marland Kitchen donepezil (ARICEPT) 10 MG tablet Take 5 mg by mouth daily. For dementia  . hydrALAZINE (APRESOLINE) 25 MG tablet Take 1 1/2 tab to= 37.5 mg by mouth  at bedtime  . hydrochlorothiazide (HYDRODIURIL) 12.5 MG tablet Take 12.5 mg by mouth daily.  Marland Kitchen ipratropium-albuterol (DUONEB) 0.5-2.5 (3) MG/3ML SOLN Take 3 mLs by nebulization every 6 (six) hours as needed.  . loratadine (CLARITIN) 10 MG tablet Take 10 mg by mouth daily.  Marland Kitchen LORazepam (ATIVAN) 0.5 MG tablet Take 1 tablet (0.5 mg total) by mouth 2 (two) times daily.  Marland Kitchen lovastatin (MEVACOR) 40 MG tablet Take 40 mg by mouth at bedtime.  . nitroGLYCERIN (NITROSTAT) 0.4 MG SL tablet Place 0.4 mg under the tongue every 5 (five) minutes as needed for chest pain.   . pantoprazole (PROTONIX) 20 MG tablet Take 20 mg by mouth 2 (two) times daily. Do not crush.   . potassium chloride (K-DUR,KLOR-CON) 10 MEQ tablet Take 10 mEq by mouth daily.  . Travoprost, BAK Free, (TRAVATAN) 0.004 %  SOLN ophthalmic solution Place 1 drop into both eyes at bedtime. For glaucoma  . [DISCONTINUED] hydrALAZINE (APRESOLINE) 25 MG tablet Take 25 mg by mouth every morning.   No facility-administered encounter medications on file as of 08/01/2018.      Review of Systems   General she not complaining any fever or chills.  Skin is not complain of rashes itching or diaphoresis she does have venous stasis changes lower extremities that are chronic.  Head ears eyes nose mouth and throat is not complaining of visual changes or sore throat.  Respiratory does not really complain of increased cough or shortness of breath this time again she does occasionally have some spots fairly quickly to oxygen.  Cardiac is not complaining of chest pain or palpitations has chronic lower extremity edema.  GI is not complaining of abdominal pain at this time nausea vomiting diarrhea constipation.  GU is not complaining of dysuria.  Musculoskeletal is not currently complaining of leg pain or joint pain will at times complain of leg pain.  Neurologic is not really complaining of dizziness headache or syncope.  And psych does appear to be  slightly anxious which is not abnormal for her appears to be in good spirits  Immunization History  Administered Date(s) Administered  . Influenza Whole 06/11/2007, 10/16/2008, 05/21/2009, 05/05/2010  . Influenza-Unspecified 06/05/2014, 06/02/2016, 06/02/2017, 06/02/2017  . Pneumococcal Conjugate-13 06/06/2016  . Pneumococcal Polysaccharide-23 06/11/2007, 06/01/2016, 03/04/2018  . Pneumococcal-Unspecified 06/06/2016  . Td 06/11/2007  . Tdap 06/24/2017  . Zoster 05/21/2007   Pertinent  Health Maintenance Due  Topic Date Due  . PNA vac Low Risk Adult (2 of 2 - PCV13) 03/05/2019  . INFLUENZA VACCINE  Completed  . DEXA SCAN  Completed   Fall Risk  05/31/2018 05/22/2017  Falls in the past year? No No   Functional Status Survey:    Vitals:   08/01/18 1051  BP: (!) 169/69  Pulse: (!) 54  Resp: 16  Temp: 98.6 F (37 C)  TempSrc: Oral  SpO2: 100%  Weight is 178.5 stable Manual blood pressure this afternoon 122/58  Physical Exam In general this is a pleasant elderly female in no distress sitting comfortably in her wheelchair she is a little bit anxious.  Her skin is warm and dry she does have chronic venous stasis changes lower extremities bilaterally.  Eyes visual acuity appears to be intact sclera and conjunctive are clear.  Oropharynx is clear mucous membranes moist.  Chest is clear to auscultation with somewhat shallow air entry there is no labored breathing.  Heart is regular rate and rhythm without murmur gallop rub she has chronic venous stasis edema bilaterally.  Abdomen is obese soft nontender with positive bowel sounds.  Musculoskeletal moves all extremities x4 at baseline largely ambulates in wheelchair edema appears to be at baseline.  Neurologic is grossly intact her speech is clear no lateralizing findings she continues to walk with assistance with a walker at times.  Psych she is pleasant and appropriate a little bit anxious but this is not  abnormal.   Labs reviewed: Recent Labs    03/05/18 0515  03/23/18 0700 04/09/18 0600 06/29/18 0700  NA 140   < > 140 143 140  K 4.0   < > 3.8 3.8 3.8  CL 102   < > 103 106 104  CO2 30   < > 29 29 28   GLUCOSE 184*   < > 95 90 94  BUN 29*   < > 42* 42* 41*  CREATININE 1.05*   < > 1.52* 1.24* 1.38*  CALCIUM 8.6*   < > 9.2 9.4 9.3  MG 2.2  --   --   --   --    < > = values in this interval not displayed.   Recent Labs    10/06/17 0743 03/03/18 1008 06/29/18 0700  AST 20 19 17   ALT 12* 12 12  ALKPHOS 74 65 78  BILITOT 0.5 0.7 0.7  PROT 7.6 7.0 7.6  ALBUMIN 3.9 3.5 3.9   Recent Labs    10/06/17 0743  03/03/18 1008 03/04/18 0703 03/15/18 0700 06/29/18 0700  WBC 3.9*   < > 5.3 3.4* 7.3 4.1  NEUTROABS 2.0  --  4.1  --  3.9  --   HGB 12.0   < > 10.2* 9.8* 11.6* 11.1*  HCT 38.4   < > 31.9* 31.7* 36.1 36.4  MCV 83.8   < > 85.5 86.1 83.2 84.3  PLT 174   < > 174 166 249 224   < > = values in this interval not displayed.   Lab Results  Component Value Date   TSH 0.369 03/05/2018   Lab Results  Component Value Date   HGBA1C 5.5 03/20/2017   Lab Results  Component Value Date   CHOL 234 (H) 09/22/2017   HDL 122 09/22/2017   LDLCALC 99 09/22/2017   TRIG 65 09/22/2017   CHOLHDL 1.9 09/22/2017    Significant Diagnostic Results in last 30 days:  No results found.  Assessment/Plan  #1-hypertension she continues on hydralazine 37.5 mg twice daily as well as 12.5 mg hydrochlorothiazide and 5 mg and Norvasc-.  I see readings systolically are quite variable-actually got a manual reading of 122/58 earlier this afternoon at this point will monitor-it appears that comes down after she gets her a.m. medication  2.  History of diastolic CHF her weight appears to be relatively stable as well as edema she is on hydrochlorothiazide has refused Lasix she is also on potassium supplementation metabolic panel last month continues to show stability.  3.  History of stage III chronic  kidney disease as noted above this appears stable with a creatinine of 1.38.  Noted last month at this point will continue to monitor periodically.  4.  History of hypoxia she will have this transitory wise--- quickly response to oxygen she actually was off her oxygen earlier this morning for an hour and oxygen saturation continued to be in the 90s without any signs of distress-at this point will monitor continue PRN oxygen physical exam was quite benign.--Monitor vital signs pulse ox every shift for 48 hours  to keep an eye on this  5.-  History of anxiety and depression-this appears relatively stable on Ativan twice daily and Celexa- a little bit anxious today but this is not really unusual.  6.-History of hyperlipidemia LDL was 99 on lab done in January she also notably had a consistently elevated HDL-actually was 122 on lab done in January-   #7- history of dementia this appears stable on Aricept  does quite well with supportive care.  8.  History of GERD again she has failed dose reduction of her Protonix is on it twice daily now this appears to help.  9.  History of left femoral DVT again she did complete a course of Eliquis she refuses aspirin subsequently secondary to saying she has an allergy to it she actually did have an updated Doppler however which showed no evidence of the DVT.  10.  History of glaucoma she continues on topical eyedrops this appears to be stable she does not complain of visual changes today.  11 history of allergic rhinitis continues on Claritin this continues to be stable.  CPT- [id number]

## 2018-08-01 NOTE — Progress Notes (Deleted)
Location:    [facility] Room Number: 122/D Place of Service:  SNF [phone]) Provider:  Mickel Duhamel, PA-C  Patient Care Team: [name] as PCP - General (Internal Medicine) [name], MD as Consulting Physician (Geriatric Medicine)  Extended Emergency Contact Information Primary Emergency Contact: [name], [place] [address] [name] of [place] Phone: [phone] Relation: Daughter Secondary Emergency Contact: [name] States of [place] Phone: [phone] Relation: Son  Code Status:  DNR Goals of care: Advanced Directive information Advanced Directives 08/01/2018  Does Patient Have a Medical Advance Directive? Yes  Type of Advance Directive Out of facility DNR (pink MOST or yellow form)  Does patient want to make changes to medical advance directive? No - Patient declined  Copy of [place] in Chart? No - copy requested  Would patient like information on creating a medical advance directive? No - Patient declined  Pre-existing out of facility DNR order (yellow form or pink MOST form) -     Chief Complaint  Patient presents with  . Acute Visit    Decreased O2 Stats    HPI:  Pt is a 84 y.o. female     Past Medical History:  Diagnosis Date  . Anxiety   . Cellulitis 2016  . CHF (congestive heart failure) ([place])   . Chronic kidney disease   . Dementia ([place])   . Dysphagia   . Gait abnormality   . GERD (gastroesophageal reflux disease)   . Glaucoma   . Gout   . Hyperlipidemia   . Hypertension   . Muscle weakness   . Myocardial infarction [facility]) [place]  . Osteoarthritis   . Syncope    Past Surgical History:  Procedure Laterality Date  . APPENDECTOMY    . CATARACT EXTRACTION W/PHACO  08/04/2011   Procedure: CATARACT EXTRACTION PHACO AND INTRAOCULAR LENS PLACEMENT (IOC);  Surgeon: Tonny Branch;  Location: [facility];  Service: Ophthalmology;   Laterality: Right;  CDE=18.53  . CATARACT EXTRACTION W/PHACO  08/25/2011   Procedure: CATARACT EXTRACTION PHACO AND INTRAOCULAR LENS PLACEMENT (IOC);  Surgeon: Tonny Branch;  Location: [facility];  Service: Ophthalmology;  Laterality: Left;  CDE:14.76  . TONSILLECTOMY      Allergies  Allergen Reactions  . Bee Venom Anaphylaxis  . Angiotensin Receptor Blockers     Tongue swelling.  . Aspirin     Directed by physician  . Cabbage Other (See Comments)    Certain foods due to gout flares  . Latex Rash    Outpatient Encounter Medications as of 08/01/2018  Medication Sig  . acetaminophen (TYLENOL) 500 MG tablet Take 1,000 mg by mouth 2 (two) times daily. For leg pain and arthritis. May have additional 1000 mg for break through pain once a day prn  . amLODipine (NORVASC) 5 MG tablet Take 5 mg by mouth daily.  . Brinzolamide-Brimonidine (SIMBRINZA) 1-0.2 % SUSP Apply to eye. One  drop into both eyes three times a day  . Calcium Carbonate-Vitamin D (CALCIUM-VITAMIN D) 500-200 MG-UNIT tablet Take 1 tablet by mouth 2 (two) times daily.  . citalopram (CELEXA) 20 MG tablet Take 20 mg by mouth daily.  . cycloSPORINE (RESTASIS) 0.05 % ophthalmic emulsion Place 1 drop into both eyes 2 (two) times daily.  Marland Kitchen docusate sodium (COLACE) 100 MG capsule Take 100 mg by mouth 2 (two) times daily.  Marland Kitchen donepezil (  ARICEPT) 10 MG tablet Take 5 mg by mouth daily. For dementia  . hydrALAZINE (APRESOLINE) 25 MG tablet Take 1 1/2 tab to= 37.5 mg by mouth at bedtime  . hydrochlorothiazide (HYDRODIURIL) 12.5 MG tablet Take 12.5 mg by mouth daily.  Marland Kitchen ipratropium-albuterol (DUONEB) 0.5-2.5 (3) MG/3ML SOLN Take 3 mLs by nebulization every 6 (six) hours as needed.  . loratadine (CLARITIN) 10 MG tablet Take 10 mg by mouth daily.  Marland Kitchen LORazepam (ATIVAN) 0.5 MG tablet Take 1 tablet (0.5 mg total) by mouth 2 (two) times daily.  Marland Kitchen lovastatin (MEVACOR) 40 MG tablet Take 40 mg by mouth at bedtime.  . nitroGLYCERIN (NITROSTAT) 0.4 MG SL  tablet Place 0.4 mg under the tongue every 5 (five) minutes as needed for chest pain.   . pantoprazole (PROTONIX) 20 MG tablet Take 20 mg by mouth 2 (two) times daily. Do not crush.   . potassium chloride (K-DUR,KLOR-CON) 10 MEQ tablet Take 10 mEq by mouth daily.  . Travoprost, BAK Free, (TRAVATAN) 0.004 % SOLN ophthalmic solution Place 1 drop into both eyes at bedtime. For glaucoma  . [DISCONTINUED] hydrALAZINE (APRESOLINE) 25 MG tablet Take 25 mg by mouth every morning.   No facility-administered encounter medications on file as of 08/01/2018.     Review of Systems  Immunization History  Administered Date(s) Administered  . Influenza Whole 06/11/2007, 10/16/2008, 05/21/2009, 05/05/2010  . Influenza-Unspecified 06/05/2014, 06/02/2016, 06/02/2017, 06/02/2017  . Pneumococcal Conjugate-13 06/06/2016  . Pneumococcal Polysaccharide-23 06/11/2007, 06/01/2016, 03/04/2018  . Pneumococcal-Unspecified 06/06/2016  . Td 06/11/2007  . Tdap 06/24/2017  . Zoster 05/21/2007   Pertinent  Health Maintenance Due  Topic Date Due  . PNA vac Low Risk Adult (2 of 2 - PCV13) 03/05/2019  . INFLUENZA VACCINE  Completed  . DEXA SCAN  Completed   Fall Risk  05/31/2018 05/22/2017  Falls in the past year? No No   Functional Status Survey:    Vitals:   08/01/18 1051  BP: (!) 169/69  Pulse: (!) 54  Resp: 16  Temp: 98.6 F (37 C)  TempSrc: Oral  SpO2: 100%   There is no height or weight on file to calculate BMI. Physical Exam  Labs reviewed: Recent Labs    03/05/18 0515  03/23/18 0700 04/09/18 0600 06/29/18 0700  NA 140   < > 140 143 140  K 4.0   < > 3.8 3.8 3.8  CL 102   < > 103 106 104  CO2 30   < > 29 29 28   GLUCOSE 184*   < > 95 90 94  BUN 29*   < > 42* 42* 41*  CREATININE 1.05*   < > 1.52* 1.24* 1.38*  CALCIUM 8.6*   < > 9.2 9.4 9.3  MG 2.2  --   --   --   --    < > = values in this interval not displayed.   Recent Labs    10/06/17 0743 03/03/18 1008 06/29/18 0700  AST 20 19  17   ALT 12* 12 12  ALKPHOS 74 65 78  BILITOT 0.5 0.7 0.7  PROT 7.6 7.0 7.6  ALBUMIN 3.9 3.5 3.9   Recent Labs    10/06/17 0743  03/03/18 1008 03/04/18 0703 03/15/18 0700 06/29/18 0700  WBC 3.9*   < > 5.3 3.4* 7.3 4.1  NEUTROABS 2.0  --  4.1  --  3.9  --   HGB 12.0   < > 10.2* 9.8* 11.6* 11.1*  HCT 38.4   < >  31.9* 31.7* 36.1 36.4  MCV 83.8   < > 85.5 86.1 83.2 84.3  PLT 174   < > 174 166 249 224   < > = values in this interval not displayed.   Lab Results  Component Value Date   TSH 0.369 03/05/2018   Lab Results  Component Value Date   HGBA1C 5.5 03/20/2017   Lab Results  Component Value Date   CHOL 234 (H) 09/22/2017   HDL 122 09/22/2017   LDLCALC 99 09/22/2017   TRIG 65 09/22/2017   CHOLHDL 1.9 09/22/2017    Significant Diagnostic Results in last 30 days:  No results found.  Assessment/Plan There are no diagnoses linked to this encounter.      Oralia Manis, [place]

## 2018-08-07 DIAGNOSIS — F428 Other obsessive-compulsive disorder: Secondary | ICD-10-CM | POA: Diagnosis not present

## 2018-08-07 DIAGNOSIS — F320 Major depressive disorder, single episode, mild: Secondary | ICD-10-CM | POA: Diagnosis not present

## 2018-08-07 DIAGNOSIS — F411 Generalized anxiety disorder: Secondary | ICD-10-CM | POA: Diagnosis not present

## 2018-08-13 ENCOUNTER — Other Ambulatory Visit: Payer: Self-pay

## 2018-08-13 MED ORDER — LORAZEPAM 0.5 MG PO TABS: 0.5000 mg | ORAL_TABLET | Freq: Two times a day (BID) | ORAL | 2 refills | Status: DC

## 2018-08-13 NOTE — Telephone Encounter (Signed)
RX Fax for [facility]@ [phone]  

## 2018-08-14 ENCOUNTER — Encounter: Payer: Self-pay | Admitting: Internal Medicine

## 2018-08-14 DIAGNOSIS — F428 Other obsessive-compulsive disorder: Secondary | ICD-10-CM | POA: Diagnosis not present

## 2018-08-14 DIAGNOSIS — F411 Generalized anxiety disorder: Secondary | ICD-10-CM | POA: Diagnosis not present

## 2018-08-14 DIAGNOSIS — F320 Major depressive disorder, single episode, mild: Secondary | ICD-10-CM | POA: Diagnosis not present

## 2018-08-14 NOTE — Progress Notes (Signed)
Location:    [place] Room Number: 122/D Place of Service:  SNF [phone]) Provider:  Granville Lewis PA-C  [name], MD  Patient Care Team: [name], MD as PCP - General (Internal Medicine) [name], MD as Consulting Physician (Geriatric Medicine) [name] as Physician Assistant (Internal Medicine)  Extended Emergency Contact Information Primary Emergency Contact: [name], [place] [address] [name] of [place] Phone: [phone] Relation: Daughter Secondary Emergency Contact: [name] States of [place] Phone: [phone] Relation: Son  Code Status:  DNR Goals of care: Advanced Directive information Advanced Directives 08/14/2018  Does Patient Have a Medical Advance Directive? Yes  Type of Advance Directive Out of facility DNR (pink MOST or yellow form)  Does patient want to make changes to medical advance directive? No - Patient declined  Copy of [place] in Chart? No - copy requested  Would patient like information on creating a medical advance directive? No - Patient declined  Pre-existing out of facility DNR order (yellow form or pink MOST form) -     Chief Complaint  Patient presents with  . Medical Management of Chronic Issues    Routine visit of medical management     HPI:  Pt is a 84 y.o. female seen today for medical management of chronic diseases.     Past Medical History:  Diagnosis Date  . Anxiety   . Cellulitis 2016  . CHF (congestive heart failure) ([place])   . Chronic kidney disease   . Dementia ([place])   . Dysphagia   . Gait abnormality   . GERD (gastroesophageal reflux disease)   . Glaucoma   . Gout   . Hyperlipidemia   . Hypertension   . Muscle weakness   . Myocardial infarction [facility]) [place]  . Osteoarthritis   . Syncope    Past Surgical History:  Procedure Laterality Date  . APPENDECTOMY    . CATARACT EXTRACTION  W/PHACO  08/04/2011   Procedure: CATARACT EXTRACTION PHACO AND INTRAOCULAR LENS PLACEMENT (IOC);  Surgeon: Tonny Branch;  Location: [facility];  Service: Ophthalmology;  Laterality: Right;  CDE=18.53  . CATARACT EXTRACTION W/PHACO  08/25/2011   Procedure: CATARACT EXTRACTION PHACO AND INTRAOCULAR LENS PLACEMENT (IOC);  Surgeon: Tonny Branch;  Location: [facility];  Service: Ophthalmology;  Laterality: Left;  CDE:14.76  . TONSILLECTOMY      Allergies  Allergen Reactions  . Bee Venom Anaphylaxis  . Angiotensin Receptor Blockers     Tongue swelling.  . Aspirin     Directed by physician  . Cabbage Other (See Comments)    Certain foods due to gout flares  . Latex Rash    Outpatient Encounter Medications as of 08/14/2018  Medication Sig  . acetaminophen (TYLENOL) 500 MG tablet Take 1,000 mg by mouth 2 (two) times daily. For leg pain and arthritis. May have additional 1000 mg for break through pain once a day prn  . amLODipine (NORVASC) 5 MG tablet Take 5 mg by mouth daily.  . Brinzolamide-Brimonidine (SIMBRINZA) 1-0.2 % SUSP Apply to eye. One  drop into both eyes three times a day  . Calcium Carbonate-Vitamin D (CALCIUM-VITAMIN D) 500-200 MG-UNIT tablet Take 1 tablet by mouth 2 (two) times daily.  . citalopram (CELEXA) 20 MG tablet Take 20 mg by mouth daily.  . cycloSPORINE (RESTASIS) 0.05 % ophthalmic emulsion Place 1 drop into both eyes  2 (two) times daily.  Marland Kitchen docusate sodium (COLACE) 100 MG capsule Take 100 mg by mouth 2 (two) times daily.  Marland Kitchen donepezil (ARICEPT) 10 MG tablet Take 5 mg by mouth daily. For dementia  . hydrALAZINE (APRESOLINE) 25 MG tablet Take 1 1/2 tab to= 37.5 mg by mouth twice a day  . hydrochlorothiazide (HYDRODIURIL) 12.5 MG tablet Take 12.5 mg by mouth daily.  Marland Kitchen ipratropium-albuterol (DUONEB) 0.5-2.5 (3) MG/3ML SOLN Take 3 mLs by nebulization every 6 (six) hours as needed.  . loratadine (CLARITIN) 10 MG tablet Take 10 mg by mouth daily.  Marland Kitchen LORazepam (ATIVAN) 0.5 MG tablet Take 1  tablet (0.5 mg total) by mouth 2 (two) times daily.  Marland Kitchen lovastatin (MEVACOR) 40 MG tablet Take 40 mg by mouth at bedtime.  . nitroGLYCERIN (NITROSTAT) 0.4 MG SL tablet Place 0.4 mg under the tongue every 5 (five) minutes as needed for chest pain.   . pantoprazole (PROTONIX) 20 MG tablet Take 20 mg by mouth 2 (two) times daily. Do not crush.   . potassium chloride (K-DUR,KLOR-CON) 10 MEQ tablet Take 10 mEq by mouth daily.  . Travoprost, BAK Free, (TRAVATAN) 0.004 % SOLN ophthalmic solution Place 1 drop into both eyes at bedtime. For glaucoma   No facility-administered encounter medications on file as of 08/14/2018.      Review of Systems  Immunization History  Administered Date(s) Administered  . Influenza Whole 06/11/2007, 10/16/2008, 05/21/2009, 05/05/2010  . Influenza-Unspecified 06/05/2014, 06/02/2016, 06/02/2017, 06/02/2017  . Pneumococcal Conjugate-13 06/06/2016  . Pneumococcal Polysaccharide-23 06/11/2007, 06/01/2016, 03/04/2018  . Pneumococcal-Unspecified 06/06/2016  . Td 06/11/2007  . Tdap 06/24/2017  . Zoster 05/21/2007   Pertinent  Health Maintenance Due  Topic Date Due  . PNA vac Low Risk Adult (2 of 2 - PCV13) 03/05/2019  . INFLUENZA VACCINE  Completed  . DEXA SCAN  Completed   Fall Risk  05/31/2018 05/22/2017  Falls in the past year? No No   Functional Status Survey:    Vitals:   08/14/18 1422  BP: (!) 151/55  Pulse: 60  Resp: 16  Temp: 98.1 F (36.7 C)  TempSrc: Oral  SpO2: 99%  Weight: 178 lb (80.7 kg)  Height: 4\' 11"  (1.499 m)   Body mass index is 35.95 kg/m. Physical Exam  Labs reviewed: Recent Labs    03/05/18 0515  03/23/18 0700 04/09/18 0600 06/29/18 0700  NA 140   < > 140 143 140  K 4.0   < > 3.8 3.8 3.8  CL 102   < > 103 106 104  CO2 30   < > 29 29 28   GLUCOSE 184*   < > 95 90 94  BUN 29*   < > 42* 42* 41*  CREATININE 1.05*   < > 1.52* 1.24* 1.38*  CALCIUM 8.6*   < > 9.2 9.4 9.3  MG 2.2  --   --   --   --    < > = values in this  interval not displayed.   Recent Labs    10/06/17 0743 03/03/18 1008 06/29/18 0700  AST 20 19 17   ALT 12* 12 12  ALKPHOS 74 65 78  BILITOT 0.5 0.7 0.7  PROT 7.6 7.0 7.6  ALBUMIN 3.9 3.5 3.9   Recent Labs    10/06/17 0743  03/03/18 1008 03/04/18 0703 03/15/18 0700 06/29/18 0700  WBC 3.9*   < > 5.3 3.4* 7.3 4.1  NEUTROABS 2.0  --  4.1  --  3.9  --  HGB 12.0   < > 10.2* 9.8* 11.6* 11.1*  HCT 38.4   < > 31.9* 31.7* 36.1 36.4  MCV 83.8   < > 85.5 86.1 83.2 84.3  PLT 174   < > 174 166 249 224   < > = values in this interval not displayed.   Lab Results  Component Value Date   TSH 0.369 03/05/2018   Lab Results  Component Value Date   HGBA1C 5.5 03/20/2017   Lab Results  Component Value Date   CHOL 234 (H) 09/22/2017   HDL 122 09/22/2017   LDLCALC 99 09/22/2017   TRIG 65 09/22/2017   CHOLHDL 1.9 09/22/2017    Significant Diagnostic Results in last 30 days:  No results found.  Assessment/Plan There are no diagnoses linked to this encounter.   Family/ staff Communication:   Labs/tests ordered:

## 2018-08-15 NOTE — Progress Notes (Signed)
This encounter was created in error - please disregard.

## 2018-08-18 DIAGNOSIS — F411 Generalized anxiety disorder: Secondary | ICD-10-CM | POA: Diagnosis not present

## 2018-08-18 DIAGNOSIS — F0390 Unspecified dementia without behavioral disturbance: Secondary | ICD-10-CM | POA: Diagnosis not present

## 2018-08-21 DIAGNOSIS — F320 Major depressive disorder, single episode, mild: Secondary | ICD-10-CM | POA: Diagnosis not present

## 2018-08-21 DIAGNOSIS — F411 Generalized anxiety disorder: Secondary | ICD-10-CM | POA: Diagnosis not present

## 2018-08-21 DIAGNOSIS — F428 Other obsessive-compulsive disorder: Secondary | ICD-10-CM | POA: Diagnosis not present

## 2018-09-03 ENCOUNTER — Encounter: Payer: Self-pay | Admitting: Internal Medicine

## 2018-09-03 ENCOUNTER — Non-Acute Institutional Stay (SKILLED_NURSING_FACILITY): Payer: Medicare Other | Admitting: Internal Medicine

## 2018-09-03 DIAGNOSIS — I10 Essential (primary) hypertension: Secondary | ICD-10-CM | POA: Diagnosis not present

## 2018-09-03 DIAGNOSIS — I5032 Chronic diastolic (congestive) heart failure: Secondary | ICD-10-CM

## 2018-09-03 DIAGNOSIS — R0902 Hypoxemia: Secondary | ICD-10-CM | POA: Diagnosis not present

## 2018-09-03 NOTE — Progress Notes (Signed)
Location:    [place] Room Number: 122/D Place of Service:  SNF [phone]) Provider:  Freddi Starr, MD  Patient Care Team: [name], MD as PCP - General (Internal Medicine) [name], MD as Consulting Physician (Geriatric Medicine) [name] as Physician Assistant (Internal Medicine)  Extended Emergency Contact Information Primary Emergency Contact: [name], [place] [address] [name] of [place] Phone: [phone] Relation: Daughter Secondary Emergency Contact: [name] States of [place] Phone: [phone] Relation: Son  Code Status:  DNR Goals of care: Advanced Directive information Advanced Directives 09/03/2018  Does Patient Have a Medical Advance Directive? Yes  Type of Advance Directive Out of facility DNR (pink MOST or yellow form)  Does patient want to make changes to medical advance directive? No - Patient declined  Copy of [place] in Chart? No - copy requested  Would patient like information on creating a medical advance directive? No - Patient declined  Pre-existing out of facility DNR order (yellow form or pink MOST form) -    Chief complaint-acute visit secondary intermittent shortness of breath   HPI:  Pt is a 85 y.o. female seen today for an acute visit for  Apparent complaints of intermittent shortness of breath.  Patient is a long-term resident of facility with a history of hypertension as well as a distal DVT chronic venous stasis stasis diastolic CHF chronic renal disease GERD glaucoma dementia and anxiety.  She is seen today for occasional complaints of shortness of breath- this is not really a new complaint per nursing. O2 sats when she takes her oxygen off goes into the 80s per nursing but quickly rises into the 90s when she applies her oxygen.  She previously had a work-up including a CT scan to rule out a pulmonary embolism  since she had an elevated d-dimer but this was negative-she also has a history of a left femoral DVT but updated Doppler showed no evidence of this-she had been on Eliquis and we wanted to transition her after the Eliquis therapy to aspirin but she states she has an allergy to aspirin and has refused it.  In regards to hypoxia again this is somewhat chronic and responds pretty rapidly to oxygen  Today her oxygen saturation was 95% oxygen apparently goes into the 80s when it is taken off for any amount of time.  She also has a history of diastolic CHF appears she is gained about 4 pounds above her baseline recently although this could be a scale variation-will have this updated tomorrow her edema appears to be relatively baseline.  She has refused Lasix but she is on low-dose hydrochlorothiazide-.  She does have a history of anxiety as well and has an order for Ativan twice a day.  At times it is thought her anxiety contributes to her complaints of shortness of breath.  She also has a history of hypertension which has had variable readings in the past she is on Norvasc 5 mg a day as well as hydrochlorothiazide 12.5 mg a day in addition to hydralazine 37.5 mg twice daily Dr. Lyndel Safe most recently increase this because of higher blood pressure readings.  Currently she is sitting in her chair she does not appear to be in any distress but does appear to be a bit anxious which she tends to do at times   Past Medical History:  Diagnosis Date  .  Anxiety   . Cellulitis 2016  . CHF (congestive heart failure) ([place])   . Chronic kidney disease   . Dementia ([place])   . Dysphagia   . Gait abnormality   . GERD (gastroesophageal reflux disease)   . Glaucoma   . Gout   . Hyperlipidemia   . Hypertension   . Muscle weakness   . Myocardial infarction [facility]) [place]  . Osteoarthritis   . Syncope    Past Surgical History:  Procedure Laterality Date  . APPENDECTOMY    . CATARACT EXTRACTION  W/PHACO  08/04/2011   Procedure: CATARACT EXTRACTION PHACO AND INTRAOCULAR LENS PLACEMENT (IOC);  Surgeon: Tonny Branch;  Location: [facility];  Service: Ophthalmology;  Laterality: Right;  CDE=18.53  . CATARACT EXTRACTION W/PHACO  08/25/2011   Procedure: CATARACT EXTRACTION PHACO AND INTRAOCULAR LENS PLACEMENT (IOC);  Surgeon: Tonny Branch;  Location: [facility];  Service: Ophthalmology;  Laterality: Left;  CDE:14.76  . TONSILLECTOMY      Allergies  Allergen Reactions  . Bee Venom Anaphylaxis  . Angiotensin Receptor Blockers     Tongue swelling.  . Aspirin     Directed by physician  . Cabbage Other (See Comments)    Certain foods due to gout flares  . Latex Rash    Outpatient Encounter Medications as of 09/03/2018  Medication Sig  . acetaminophen (TYLENOL) 500 MG tablet Take 1,000 mg by mouth 2 (two) times daily. For leg pain and arthritis. May have additional 1000 mg for break through pain once a day prn  . amLODipine (NORVASC) 5 MG tablet Take 5 mg by mouth daily.  . Brinzolamide-Brimonidine (SIMBRINZA) 1-0.2 % SUSP Apply to eye. One  drop into both eyes three times a day  . Calcium Carbonate-Vitamin D (CALCIUM-VITAMIN D) 500-200 MG-UNIT tablet Take 1 tablet by mouth 2 (two) times daily.  . citalopram (CELEXA) 20 MG tablet Take 20 mg by mouth daily.  . cycloSPORINE (RESTASIS) 0.05 % ophthalmic emulsion Place 1 drop into both eyes 2 (two) times daily.  Marland Kitchen docusate sodium (COLACE) 100 MG capsule Take 100 mg by mouth 2 (two) times daily.  Marland Kitchen donepezil (ARICEPT) 10 MG tablet Take 5 mg by mouth daily. For dementia  . hydrALAZINE (APRESOLINE) 25 MG tablet Take 1 1/2 tab to= 37.5 mg by mouth twice a day  . hydrochlorothiazide (HYDRODIURIL) 12.5 MG tablet Take 12.5 mg by mouth daily.  Marland Kitchen ipratropium-albuterol (DUONEB) 0.5-2.5 (3) MG/3ML SOLN Take 3 mLs by nebulization every 6 (six) hours as needed.  . loratadine (CLARITIN) 10 MG tablet Take 10 mg by mouth daily.  Marland Kitchen LORazepam (ATIVAN) 0.5 MG tablet Take 1  tablet (0.5 mg total) by mouth 2 (two) times daily.  Marland Kitchen lovastatin (MEVACOR) 40 MG tablet Take 40 mg by mouth at bedtime.  . nitroGLYCERIN (NITROSTAT) 0.4 MG SL tablet Place 0.4 mg under the tongue every 5 (five) minutes as needed for chest pain.   . pantoprazole (PROTONIX) 20 MG tablet Take 20 mg by mouth 2 (two) times daily. Do not crush.   . potassium chloride (K-DUR,KLOR-CON) 10 MEQ tablet Take 10 mEq by mouth daily.  . Travoprost, BAK Free, (TRAVATAN) 0.004 % SOLN ophthalmic solution Place 1 drop into both eyes at bedtime. For glaucoma   No facility-administered encounter medications on file as of 09/03/2018.     Review of Systems   In general she is not complaining of any fever or chills.  Skin is not complain of rashes or itching does have  chronic venous stasis changes lower extremities.  Head ears eyes nose mouth and throat is not complain of visual changes or sore throat.  Respiratory occasionally will complain of shortness of breath although this is somewhat chronic in nature- does not complain of an increased cough from baseline.  Cardiac does not complain of chest pain at this time or palpitations.  GI does have sometimes "sore stomach", e she is on Protonix twice daily has failed dose reduction--is not complain of constipation or diarrhea nausea or vomiting.  GU is not complaining of dysuria.  Musculoskeletal-she is not complaining of joint pain and leg pain at this time.  When her legs are touched at times she will say it hurts but this also is somewhat of a chronic complaint neurologic does not complain of dizziness headache or syncope at this time     psych does have some history of depression anxiety and mild dementia at this point appears to have some slightly increased anxiety  Immunization History  Administered Date(s) Administered  . Influenza Whole 06/11/2007, 10/16/2008, 05/21/2009, 05/05/2010  . Influenza-Unspecified 06/05/2014, 06/02/2016, 06/02/2017,  06/02/2017  . Pneumococcal Conjugate-13 06/06/2016  . Pneumococcal Polysaccharide-23 06/11/2007, 06/01/2016, 03/04/2018  . Pneumococcal-Unspecified 06/06/2016  . Td 06/11/2007  . Tdap 06/24/2017  . Zoster 05/21/2007   Pertinent  Health Maintenance Due  Topic Date Due  . PNA vac Low Risk Adult (2 of 2 - PCV13) 03/05/2019  . INFLUENZA VACCINE  Completed  . DEXA SCAN  Completed   Fall Risk  05/31/2018 05/22/2017  Falls in the past year? No No   Functional Status Survey:    She is afebrile pulse is 60 respirations of 18 blood pressure taken manually 158/72 Weight most recent listed 184 pounds which is about 4 to 6 pounds over her recent baseline Oxygen saturation is 95% on 2 L of oxygen Physical Exam In general this is a pleasant elderly female who appears to be a bit anxious but in no distress.  Her skin is warm and dry she does have venous stasis changes lower extremities.  Eyes visual acuity appears to be intact sclera and conjunctive are clear.  Oropharynx mucous membranes moist she has food residue on her tongue.  Chest- has somewhat shallow air entry she has some expiratory wheezing upper lung fields- at times she will develop this--- possibly slightly increased from her baseline  Abdomen is obese soft nontender with positive bowel sounds.  Musculoskeletal is able to move all extremities x4 she is not complaining of pain when I this afternoon- her edema appears to be relatively baseline with venous stasis changes.  Neurologic is grossly intact her speech is clear no lateralizing findings.  Psych she is pleasant appropriate somewhat anxious   Labs reviewed: Recent Labs    03/05/18 0515  03/23/18 0700 04/09/18 0600 06/29/18 0700  NA 140   < > 140 143 140  K 4.0   < > 3.8 3.8 3.8  CL 102   < > 103 106 104  CO2 30   < > 29 29 28   GLUCOSE 184*   < > 95 90 94  BUN 29*   < > 42* 42* 41*  CREATININE 1.05*   < > 1.52* 1.24* 1.38*  CALCIUM 8.6*   < > 9.2 9.4 9.3  MG  2.2  --   --   --   --    < > = values in this interval not displayed.   Recent Labs    10/06/17 [phone]  03/03/18 1008 06/29/18 0700  AST 20 19 17   ALT 12* 12 12  ALKPHOS 74 65 78  BILITOT 0.5 0.7 0.7  PROT 7.6 7.0 7.6  ALBUMIN 3.9 3.5 3.9   Recent Labs    10/06/17 0743  03/03/18 1008 03/04/18 0703 03/15/18 0700 06/29/18 0700  WBC 3.9*   < > 5.3 3.4* 7.3 4.1  NEUTROABS 2.0  --  4.1  --  3.9  --   HGB 12.0   < > 10.2* 9.8* 11.6* 11.1*  HCT 38.4   < > 31.9* 31.7* 36.1 36.4  MCV 83.8   < > 85.5 86.1 83.2 84.3  PLT 174   < > 174 166 249 224   < > = values in this interval not displayed.   Lab Results  Component Value Date   TSH 0.369 03/05/2018   Lab Results  Component Value Date   HGBA1C 5.5 03/20/2017   Lab Results  Component Value Date   CHOL 234 (H) 09/22/2017   HDL 122 09/22/2017   LDLCALC 99 09/22/2017   TRIG 65 09/22/2017   CHOLHDL 1.9 09/22/2017    Significant Diagnostic Results in last 30 days:  No results found.  Assessment/Plan  #1- intermittent hypoxia-as stated above this is not totally new- she is not in any distress and appears to be at her baseline- she appears to have possibly some slightly increased wheezing from baseline-will order a chest x-ray 2 views- also monitor vital signs pulse ox every shift for 48 hours.  She does have nebulizers every 6 hours as needed at this point will monitor.  2.  History of hypertension and appears her systolic blood pressures somewhat are fairly consistently elevated I see readings ranging from the 130s to 180 area-but most appear to be 150 or above recently-will increase her hydralazine to 50 mg twice daily continued hydrochlorothiazide 12.5 mg a day as well as Norvasc 5 mg.  3.  History of diastolic CHF-she is on low-dose hydrochlorothiazide-again has refused Lasix- will order a weight tomorrow to see if the 184 reading is accurate- also will see what the chest x-ray tells [place] although she appears to be relatively  stable in this regards.--Also will update a metabolic panel last creatinine in early November was 1.38 which appears relatively baseline.  She does have some history of renal insufficiency  .  [id number]

## 2018-09-04 ENCOUNTER — Ambulatory Visit (HOSPITAL_COMMUNITY): Payer: Medicare Other | Attending: Internal Medicine

## 2018-09-04 ENCOUNTER — Encounter (HOSPITAL_COMMUNITY)
Admission: RE | Admit: 2018-09-04 | Discharge: 2018-09-04 | Disposition: A | Discharge status: Home / Self Care | Payer: Medicare Other | Source: Skilled Nursing Facility | Attending: Internal Medicine | Admitting: Internal Medicine

## 2018-09-04 DIAGNOSIS — I130 Hypertensive heart and chronic kidney disease with heart failure and stage 1 through stage 4 chronic kidney disease, or unspecified chronic kidney disease: Secondary | ICD-10-CM | POA: Diagnosis not present

## 2018-09-04 DIAGNOSIS — I5032 Chronic diastolic (congestive) heart failure: Secondary | ICD-10-CM | POA: Insufficient documentation

## 2018-09-04 DIAGNOSIS — J984 Other disorders of lung: Secondary | ICD-10-CM | POA: Diagnosis not present

## 2018-09-04 DIAGNOSIS — R062 Wheezing: Secondary | ICD-10-CM | POA: Insufficient documentation

## 2018-09-04 LAB — CBC WITH DIFFERENTIAL/PLATELET
Abs Immature Granulocytes: 0 10*3/uL (ref 0.00–0.07)
Basophils Absolute: 0 10*3/uL (ref 0.0–0.1)
Basophils Relative: 0 %
Eosinophils Absolute: 0.1 10*3/uL (ref 0.0–0.5)
Eosinophils Relative: 2 %
HCT: 34.8 % — ABNORMAL LOW (ref 36.0–46.0)
Hemoglobin: 10.5 g/dL — ABNORMAL LOW (ref 12.0–15.0)
Immature Granulocytes: 0 %
Lymphocytes Relative: 21 %
Lymphs Abs: 0.9 10*3/uL (ref 0.7–4.0)
MCH: 25.7 pg — ABNORMAL LOW (ref 26.0–34.0)
MCHC: 30.2 g/dL (ref 30.0–36.0)
MCV: 85.1 fL (ref 80.0–100.0)
Monocytes Absolute: 0.5 10*3/uL (ref 0.1–1.0)
Monocytes Relative: 12 %
Neutro Abs: 2.9 10*3/uL (ref 1.7–7.7)
Neutrophils Relative %: 65 %
Platelets: 188 10*3/uL (ref 150–400)
RBC: 4.09 MIL/uL (ref 3.87–5.11)
RDW: 17.8 % — ABNORMAL HIGH (ref 11.5–15.5)
WBC: 4.5 10*3/uL (ref 4.0–10.5)
nRBC: 0 % (ref 0.0–0.2)

## 2018-09-04 LAB — BASIC METABOLIC PANEL
Anion gap: 9 (ref 5–15)
BUN: 27 mg/dL — AB (ref 8–23)
CO2: 31 mmol/L (ref 22–32)
CREATININE: 1.09 mg/dL — AB (ref 0.44–1.00)
Calcium: 9.7 mg/dL (ref 8.9–10.3)
Chloride: 99 mmol/L (ref 98–111)
GFR calc Af Amer: 50 mL/min — ABNORMAL LOW (ref >60)
GFR, EST NON AFRICAN AMERICAN: 43 mL/min — AB (ref >60)
Glucose, Bld: 93 mg/dL (ref 70–99)
POTASSIUM: 3.6 mmol/L (ref 3.5–5.1)
SODIUM: 139 mmol/L (ref 135–145)

## 2018-09-18 DIAGNOSIS — F428 Other obsessive-compulsive disorder: Secondary | ICD-10-CM | POA: Diagnosis not present

## 2018-09-18 DIAGNOSIS — F320 Major depressive disorder, single episode, mild: Secondary | ICD-10-CM | POA: Diagnosis not present

## 2018-09-18 DIAGNOSIS — F411 Generalized anxiety disorder: Secondary | ICD-10-CM | POA: Diagnosis not present

## 2018-09-21 ENCOUNTER — Encounter: Payer: Self-pay | Admitting: Internal Medicine

## 2018-09-21 NOTE — Progress Notes (Signed)
Location:    [facility] Room Number: 122/D Place of Service:  SNF [phone]) Provider:  Freddi Starr, MD  Patient Care Team: [name], MD as PCP - General (Internal Medicine) [name], MD as Consulting Physician (Geriatric Medicine) [name] as Physician Assistant (Internal Medicine)  Extended Emergency Contact Information Primary Emergency Contact: [name], [place] [address] [name] of [place] Phone: [phone] Relation: Daughter Secondary Emergency Contact: [name] [place] of [place] Phone: [phone] Relation: Son  Code Status:  DNR Goals of care: Advanced Directive information Advanced Directives 09/21/2018  Does Patient Have a Medical Advance Directive? Yes  Type of Advance Directive Out of facility DNR (pink MOST or yellow form)  Does patient want to make changes to medical advance directive? No - Patient declined  Copy of [place] in Chart? No - copy requested  Would patient like information on creating a medical advance directive? No - Patient declined  Pre-existing out of facility DNR order (yellow form or pink MOST form) -     Chief Complaint  Patient presents with  . Acute Visit    Fall    HPI:  Pt is a 85 y.o. female seen today for an acute visit for    Past Medical History:  Diagnosis Date  . Anxiety   . Cellulitis 2016  . CHF (congestive heart failure) ([place])   . Chronic kidney disease   . Dementia ([place])   . Dysphagia   . Gait abnormality   . GERD (gastroesophageal reflux disease)   . Glaucoma   . Gout   . Hyperlipidemia   . Hypertension   . Muscle weakness   . Myocardial infarction [facility]) [place]  . Osteoarthritis   . Syncope    Past Surgical History:  Procedure Laterality Date  . APPENDECTOMY    . CATARACT EXTRACTION W/PHACO  08/04/2011   Procedure: CATARACT EXTRACTION PHACO AND INTRAOCULAR LENS  PLACEMENT (IOC);  Surgeon: Tonny Branch;  Location: [facility];  Service: Ophthalmology;  Laterality: Right;  CDE=18.53  . CATARACT EXTRACTION W/PHACO  08/25/2011   Procedure: CATARACT EXTRACTION PHACO AND INTRAOCULAR LENS PLACEMENT (IOC);  Surgeon: Tonny Branch;  Location: [facility];  Service: Ophthalmology;  Laterality: Left;  CDE:14.76  . TONSILLECTOMY      Allergies  Allergen Reactions  . Bee Venom Anaphylaxis  . Angiotensin Receptor Blockers     Tongue swelling.  . Aspirin     Directed by physician  . Cabbage Other (See Comments)    Certain foods due to gout flares  . Latex Rash    Outpatient Encounter Medications as of 09/21/2018  Medication Sig  . acetaminophen (TYLENOL) 500 MG tablet Take 1,000 mg by mouth 2 (two) times daily. For leg pain and arthritis. May have additional 1000 mg for break through pain once a day prn  . amLODipine (NORVASC) 5 MG tablet Take 5 mg by mouth daily.  . Brinzolamide-Brimonidine (SIMBRINZA) 1-0.2 % SUSP Apply to eye. One  drop into both eyes three times a day  . Calcium Carbonate-Vitamin D (CALCIUM-VITAMIN D) 500-200 MG-UNIT tablet Take 1 tablet by mouth 2 (two) times daily.  . citalopram (CELEXA) 20 MG tablet Take 20 mg by mouth daily.  . cycloSPORINE (RESTASIS) 0.05 % ophthalmic emulsion Place 1 drop into both eyes 2 (two) times daily.  Marland Kitchen docusate sodium (COLACE) 100 MG  capsule Take 100 mg by mouth 2 (two) times daily.  Marland Kitchen donepezil (ARICEPT) 10 MG tablet Take 5 mg by mouth daily. For dementia  . hydrALAZINE (APRESOLINE) 25 MG tablet Take 1Tablet 50 mg by mouth twice a day  . hydrochlorothiazide (HYDRODIURIL) 12.5 MG tablet Take 12.5 mg by mouth daily.  Marland Kitchen ipratropium-albuterol (DUONEB) 0.5-2.5 (3) MG/3ML SOLN Take 3 mLs by nebulization every 6 (six) hours as needed.  . loratadine (CLARITIN) 10 MG tablet Take 10 mg by mouth daily.  Marland Kitchen LORazepam (ATIVAN) 0.5 MG tablet Take 1 tablet (0.5 mg total) by mouth 2 (two) times daily.  Marland Kitchen lovastatin (MEVACOR) 40 MG  tablet Take 40 mg by mouth at bedtime.  . nitroGLYCERIN (NITROSTAT) 0.4 MG SL tablet Place 0.4 mg under the tongue every 5 (five) minutes as needed for chest pain.   . pantoprazole (PROTONIX) 20 MG tablet Take 20 mg by mouth 2 (two) times daily. Do not crush.   . potassium chloride (K-DUR,KLOR-CON) 10 MEQ tablet Take 10 mEq by mouth daily.  . Travoprost, BAK Free, (TRAVATAN) 0.004 % SOLN ophthalmic solution Place 1 drop into both eyes at bedtime. For glaucoma   No facility-administered encounter medications on file as of 09/21/2018.     Review of Systems  Immunization History  Administered Date(s) Administered  . Influenza Whole 06/11/2007, 10/16/2008, 05/21/2009, 05/05/2010  . Influenza-Unspecified 06/05/2014, 06/02/2016, 06/02/2017, 06/02/2017  . Pneumococcal Conjugate-13 06/06/2016  . Pneumococcal Polysaccharide-23 06/11/2007, 06/01/2016, 03/04/2018  . Pneumococcal-Unspecified 06/06/2016  . Td 06/11/2007  . Tdap 06/24/2017  . Zoster 05/21/2007   Pertinent  Health Maintenance Due  Topic Date Due  . PNA vac Low Risk Adult (2 of 2 - PCV13) 03/05/2019  . INFLUENZA VACCINE  Completed  . DEXA SCAN  Completed   Fall Risk  05/31/2018 05/22/2017  Falls in the past year? No No   Functional Status Survey:    There were no vitals filed for this visit. There is no height or weight on file to calculate BMI. Physical Exam  Labs reviewed: Recent Labs    03/05/18 0515  04/09/18 0600 06/29/18 0700 09/04/18 0700  NA 140   < > 143 140 139  K 4.0   < > 3.8 3.8 3.6  CL 102   < > 106 104 99  CO2 30   < > 29 28 31   GLUCOSE 184*   < > 90 94 93  BUN 29*   < > 42* 41* 27*  CREATININE 1.05*   < > 1.24* 1.38* 1.09*  CALCIUM 8.6*   < > 9.4 9.3 9.7  MG 2.2  --   --   --   --    < > = values in this interval not displayed.   Recent Labs    10/06/17 0743 03/03/18 1008 06/29/18 0700  AST 20 19 17   ALT 12* 12 12  ALKPHOS 74 65 78  BILITOT 0.5 0.7 0.7  PROT 7.6 7.0 7.6  ALBUMIN 3.9 3.5  3.9   Recent Labs    03/03/18 1008  03/15/18 0700 06/29/18 0700 09/04/18 0700  WBC 5.3   < > 7.3 4.1 4.5  NEUTROABS 4.1  --  3.9  --  2.9  HGB 10.2*   < > 11.6* 11.1* 10.5*  HCT 31.9*   < > 36.1 36.4 34.8*  MCV 85.5   < > 83.2 84.3 85.1  PLT 174   < > 249 224 188   < > = values in this interval  not displayed.   Lab Results  Component Value Date   TSH 0.369 03/05/2018   Lab Results  Component Value Date   HGBA1C 5.5 03/20/2017   Lab Results  Component Value Date   CHOL 234 (H) 09/22/2017   HDL 122 09/22/2017   LDLCALC 99 09/22/2017   TRIG 65 09/22/2017   CHOLHDL 1.9 09/22/2017    Significant Diagnostic Results in last 30 days:  Dg Chest 2 View  Result Date: 09/04/2018 CLINICAL DATA:  85 year old female with a history of wheezing and shortness of breath EXAM: CHEST - 2 VIEW COMPARISON:  CT 06/07/2017, chest x-ray 03/03/2018 FINDINGS: Cardiomediastinal silhouette unchanged with cardiomegaly. No evidence of interlobular septal thickening. Similar appearance of prominent central vasculature. No pneumothorax. No pleural effusion. Coarsened interstitial markings are similar to prior. No confluent airspace disease. Degenerative changes of the thoracic spine. No acute displaced fracture. Degenerative changes of the shoulders. IMPRESSION: Chronic lung changes and cardiomegaly without evidence of acute cardiopulmonary disease Electronically Signed   By: Corrie Mckusick D.O.   On: 09/04/2018 13:49    Assessment/Plan There are no diagnoses linked to this encounter.      Oralia Manis, [place]

## 2018-09-23 NOTE — Progress Notes (Signed)
This encounter was created in error - please disregard.

## 2018-09-25 DIAGNOSIS — F428 Other obsessive-compulsive disorder: Secondary | ICD-10-CM | POA: Diagnosis not present

## 2018-09-25 DIAGNOSIS — F320 Major depressive disorder, single episode, mild: Secondary | ICD-10-CM | POA: Diagnosis not present

## 2018-09-25 DIAGNOSIS — F411 Generalized anxiety disorder: Secondary | ICD-10-CM | POA: Diagnosis not present

## 2018-10-17 ENCOUNTER — Encounter: Payer: Self-pay | Admitting: Adult Health

## 2018-10-17 ENCOUNTER — Non-Acute Institutional Stay (SKILLED_NURSING_FACILITY): Payer: Medicare Other | Admitting: Adult Health

## 2018-10-17 DIAGNOSIS — F418 Other specified anxiety disorders: Secondary | ICD-10-CM | POA: Diagnosis not present

## 2018-10-17 DIAGNOSIS — I82402 Acute embolism and thrombosis of unspecified deep veins of left lower extremity: Secondary | ICD-10-CM | POA: Diagnosis not present

## 2018-10-17 DIAGNOSIS — F0151 Vascular dementia with behavioral disturbance: Secondary | ICD-10-CM | POA: Diagnosis not present

## 2018-10-17 DIAGNOSIS — Z9981 Dependence on supplemental oxygen: Secondary | ICD-10-CM

## 2018-10-17 DIAGNOSIS — E785 Hyperlipidemia, unspecified: Secondary | ICD-10-CM | POA: Diagnosis not present

## 2018-10-17 DIAGNOSIS — J9611 Chronic respiratory failure with hypoxia: Secondary | ICD-10-CM | POA: Diagnosis not present

## 2018-10-17 DIAGNOSIS — M150 Primary generalized (osteo)arthritis: Secondary | ICD-10-CM

## 2018-10-17 DIAGNOSIS — F01518 Vascular dementia, unspecified severity, with other behavioral disturbance: Secondary | ICD-10-CM

## 2018-10-17 DIAGNOSIS — I130 Hypertensive heart and chronic kidney disease with heart failure and stage 1 through stage 4 chronic kidney disease, or unspecified chronic kidney disease: Secondary | ICD-10-CM

## 2018-10-17 DIAGNOSIS — J3089 Other allergic rhinitis: Secondary | ICD-10-CM | POA: Diagnosis not present

## 2018-10-17 DIAGNOSIS — K5909 Other constipation: Secondary | ICD-10-CM

## 2018-10-17 DIAGNOSIS — I5032 Chronic diastolic (congestive) heart failure: Secondary | ICD-10-CM

## 2018-10-17 DIAGNOSIS — H40053 Ocular hypertension, bilateral: Secondary | ICD-10-CM

## 2018-10-17 DIAGNOSIS — N183 Chronic kidney disease, stage 3 unspecified: Secondary | ICD-10-CM

## 2018-10-17 DIAGNOSIS — K219 Gastro-esophageal reflux disease without esophagitis: Secondary | ICD-10-CM | POA: Diagnosis not present

## 2018-10-17 DIAGNOSIS — M159 Polyosteoarthritis, unspecified: Secondary | ICD-10-CM

## 2018-10-17 NOTE — Progress Notes (Signed)
Location:   [facility] Room Number: 24 D Place of Service:  SNF (31)   CODE STATUS: DNR  Allergies  Allergen Reactions  . Bee Venom Anaphylaxis  . Ace Inhibitors   . Angiotensin Receptor Blockers     Tongue swelling.  . Aspirin     Directed by physician  . Cabbage Other (See Comments)    Certain foods due to gout flares  . Latex Rash    Chief Complaint  Patient presents with  . Medical Management of Chronic Issues    Hypertensive heart and kidney disease with chronic diastolic congestive heart failure and stage 3 chronic kidney disease; deep vein thrombosis (DVT); of left lower extremity unspecified chronicity; unspecified vein; chronic diastolic CHD; chronic non-seasonal allergic rhinitis.     HPI:  She is a 85 year old long term resident of this facility being seen for the management of her chronic illnesses: hypertensive heart disease; dvt; chronic diastolic heart failure; allergic rhinitis. She denies any sinus congestion; no uncontrolled pain; no anxiety or agitation.   Past Medical History:  Diagnosis Date  . Anxiety   . Cellulitis 2016  . CHF (congestive heart failure) ([place])   . Chronic kidney disease   . Dementia ([place])   . Dysphagia   . Gait abnormality   . GERD (gastroesophageal reflux disease)   . Glaucoma   . Gout   . Hyperlipidemia   . Hypertension   . Muscle weakness   . Myocardial infarction [facility]) [place]  . Osteoarthritis   . Syncope     Past Surgical History:  Procedure Laterality Date  . APPENDECTOMY    . CATARACT EXTRACTION W/PHACO  08/04/2011   Procedure: CATARACT EXTRACTION PHACO AND INTRAOCULAR LENS PLACEMENT (IOC);  Surgeon: Tonny Branch;  Location: [facility];  Service: Ophthalmology;  Laterality: Right;  CDE=18.53  . CATARACT EXTRACTION W/PHACO  08/25/2011   Procedure: CATARACT EXTRACTION PHACO AND INTRAOCULAR LENS PLACEMENT (IOC);  Surgeon: Tonny Branch;  Location: [facility];  Service: Ophthalmology;   Laterality: Left;  CDE:14.76  . TONSILLECTOMY      Social History   Socioeconomic History  . Marital status: Divorced    Spouse name: Not on file  . Number of children: Not on file  . Years of education: Not on file  . Highest education level: Not on file  Occupational History  . Occupation: retired   Scientific laboratory technician  . Financial resource strain: Not hard at all  . Food insecurity:    Worry: Never true    Inability: Never true  . Transportation needs:    Medical: No    Non-medical: No  Tobacco Use  . Smoking status: Never Smoker  . Smokeless tobacco: Never Used  Substance and Sexual Activity  . Alcohol use: No  . Drug use: No  . Sexual activity: Not Currently  Lifestyle  . Physical activity:    Days per week: 0 days    Minutes per session: 0 min  . Stress: Only a little  Relationships  . Social connections:    Talks on phone: More than three times a week    Gets together: More than three times a week    Attends religious service: Never    Active member of club or organization: No    Attends meetings of clubs or organizations: Never    Relationship status: Divorced  . Intimate partner violence:    Fear of current or ex partner: No  Emotionally abused: No    Physically abused: No    Forced sexual activity: No  Other Topics Concern  . Not on file  Social History Narrative  . Not on file   Family History  Problem Relation Age of Onset  . Diabetes Father   . Diabetes Sister   . Diabetes Brother   . Stroke Sister   . Stroke Sister   . Anesthesia problems Neg Hx   . Hypotension Neg Hx   . Malignant hyperthermia Neg Hx   . Pseudochol deficiency Neg Hx       VITAL SIGNS BP (!) 140/59   Pulse 64   Temp 98 F (36.7 C)   Resp 20   Ht 4\' 11"  (1.499 m)   Wt 182 lb 9.6 oz (82.8 kg)   SpO2 98%   BMI 36.88 kg/m   Outpatient Encounter Medications as of 10/17/2018  Medication Sig  . acetaminophen (TYLENOL) 500 MG tablet Take 1,000 mg by mouth 2 (two) times  daily. For leg pain and arthritis. May have additional 1000 mg for break through pain once a day prn  . amLODipine (NORVASC) 5 MG tablet Take 5 mg by mouth daily.  . Brinzolamide-Brimonidine (SIMBRINZA) 1-0.2 % SUSP One  drop into both eyes three times a day   . Calcium Carbonate-Vitamin D (CALCIUM-VITAMIN D) 500-200 MG-UNIT tablet Take 1 tablet by mouth 2 (two) times daily.  . citalopram (CELEXA) 20 MG tablet Take 20 mg by mouth daily.  . cycloSPORINE (RESTASIS) 0.05 % ophthalmic emulsion Place 1 drop into both eyes 2 (two) times daily.  Marland Kitchen docusate sodium (COLACE) 100 MG capsule Take 100 mg by mouth 2 (two) times daily.  Marland Kitchen donepezil (ARICEPT) 10 MG tablet Take 5 mg by mouth daily. For dementia  . hydrALAZINE (APRESOLINE) 25 MG tablet Take 1Tablet 50 mg by mouth twice a day  . hydrochlorothiazide (HYDRODIURIL) 12.5 MG tablet Take 12.5 mg by mouth daily.  Marland Kitchen ipratropium-albuterol (DUONEB) 0.5-2.5 (3) MG/3ML SOLN Take 3 mLs by nebulization every 6 (six) hours as needed.  . loratadine (CLARITIN) 10 MG tablet Take 10 mg by mouth daily.  Marland Kitchen LORazepam (ATIVAN) 0.5 MG tablet Take 1 tablet (0.5 mg total) by mouth 2 (two) times daily.  Marland Kitchen lovastatin (MEVACOR) 40 MG tablet Take 40 mg by mouth at bedtime.  . nitroGLYCERIN (NITROSTAT) 0.4 MG SL tablet Place 0.4 mg under the tongue every 5 (five) minutes as needed for chest pain.   . NON FORMULARY Diet Type:  Regular, NAS  . OXYGEN Inhale 2 L/min into the lungs continuous. Wean to keep sats above 90%  . pantoprazole (PROTONIX) 20 MG tablet Take 20 mg by mouth 2 (two) times daily. Do not crush.   . potassium chloride (K-DUR,KLOR-CON) 10 MEQ tablet Take 10 mEq by mouth daily.  . Saline (OCEAN NASAL SPRAY NA) Place 1 spray into the nose every 2 (two) hours as needed.  . Travoprost, BAK Free, (TRAVATAN) 0.004 % SOLN ophthalmic solution Place 1 drop into both eyes at bedtime. For glaucoma   No facility-administered encounter medications on file as of 10/17/2018.       SIGNIFICANT DIAGNOSTIC EXAMS  LABS REVIEWED TODAY:   06-29-18: wbc 4.1; hgb 11.1; hct 36.4; mcv 84.3; plt 224; glucose 94; bun 41; creat 1.38; k+ 3.8; na++ 140; ca 9.0; liver normal albumin 3.9  09-04-18: wbc 4.5; hgb 10.5; hc6 34.8; mcv 85.1 plt 188 glucose 93; bun 27; creat 1.09; k+ 3.6; na++ 139; ca 9.7  Review of Systems  Constitutional: Negative for malaise/fatigue.  Respiratory: Negative for cough and shortness of breath.   Cardiovascular: Negative for chest pain, palpitations and leg swelling.  Gastrointestinal: Negative for abdominal pain, constipation and heartburn.  Musculoskeletal: Negative for back pain, joint pain and myalgias.  Skin: Negative.   Neurological: Negative for dizziness.  Psychiatric/Behavioral: The patient is not nervous/anxious.     Physical Exam Constitutional:      General: She is not in acute distress.    Appearance: She is well-developed. She is not diaphoretic.  Eyes:     Comments: History of bilateral cataract removal with lens implant   Neck:     Musculoskeletal: Neck supple.     Thyroid: No thyromegaly.  Cardiovascular:     Rate and Rhythm: Normal rate and regular rhythm.     Heart sounds: Murmur present.     Comments: 3/6 Post tibs present bilaterally Pedal pulses faint  Pulmonary:     Effort: Pulmonary effort is normal. No respiratory distress.     Comments: 02 dependent  Breath sounds diminished in bases  Abdominal:     General: Bowel sounds are normal. There is no distension.     Palpations: Abdomen is soft.     Tenderness: There is no abdominal tenderness.  Musculoskeletal:     Right lower leg: Edema present.     Left lower leg: Edema present.     Comments: Is able to move all extremities Uses walker Has 2+ bilateral lower extremity edema  Lymphadenopathy:     Cervical: No cervical adenopathy.  Skin:    General: Skin is warm and dry.     Comments: Bilateral lower extremities discolored   Neurological:     Mental  Status: She is alert. Mental status is at baseline.  Psychiatric:        Mood and Affect: Mood normal.        ASSESSMENT/ PLAN:  TODAY:   1. Hypertensive heart and kidney disease with chronic diastolic congestive heart failure and stage 3 chronic kidney disease: is stable b/p 140/59: will continue norvasc 5 mg daily; hctz 12.5 mg daily apresoline 50 mg twice daily   2. Chronic diastolic CF (congestive heart failure) EF 65-70% (03-04-18); is stable will continue apresoline 50 mg twice daily has prn ntg for chest pain   3. Deep vein thrombosis (DVT) of left lower extremity unspecified chronicity unspecified vein: is stable will monitor  4. Chronic non-seasonal allergic rhinitis: is stable will continue claritin 10 mg  5. Chronic respiratory failure with hypoxia on home 02 therapy: is stable is 02 dependent; will continue duoneb every 6 hours as needed  6. GERD without esophagitis: is stable will continue protonix 20 mg twice daily  7. Chronic constipation: is stable will continue colace twice daily  8. Vascular dementia with behavioral disturbance: is without change: weight is 182 pounds; albumin 3.9; will continue aricept 5 mg daily   9. Primary osteoarthritis multiple joints: is stable will continue tylenol 1 gm twice daily   10. CKD (chronic kidney disease) stage 3 GFR 30-59 ml/min: is stable bun 27; creat 1.09  11. Depression with anxiety: is stable will continue celexa 20 mg daily and ativan 0.5 mg twice daily for anxiety  12. Hyperlipidemia: is stable due to advanced age will stop mevacor as this medication is not providing her any benefit   13. Increased intraocular pressure; bilateral: is stable will continue simbrinza to both eyes three times daily and travatan to both eyes  nightly        MD is aware of resident's narcotic use and is in agreement with current plan of care. We will attempt to wean resident as apropriate   Ok Edwards NP [facility]    Contact [phone] Monday through Friday 8am- 5pm  After hours call [phone]

## 2018-10-20 ENCOUNTER — Encounter: Payer: Self-pay | Admitting: Adult Health

## 2018-10-20 DIAGNOSIS — Z9981 Dependence on supplemental oxygen: Secondary | ICD-10-CM

## 2018-10-20 DIAGNOSIS — I130 Hypertensive heart and chronic kidney disease with heart failure and stage 1 through stage 4 chronic kidney disease, or unspecified chronic kidney disease: Secondary | ICD-10-CM

## 2018-10-20 DIAGNOSIS — I5032 Chronic diastolic (congestive) heart failure: Secondary | ICD-10-CM

## 2018-10-20 DIAGNOSIS — N183 Chronic kidney disease, stage 3 unspecified: Secondary | ICD-10-CM | POA: Insufficient documentation

## 2018-10-20 DIAGNOSIS — K5909 Other constipation: Secondary | ICD-10-CM | POA: Insufficient documentation

## 2018-10-20 DIAGNOSIS — J9611 Chronic respiratory failure with hypoxia: Secondary | ICD-10-CM | POA: Insufficient documentation

## 2018-10-20 DIAGNOSIS — H40053 Ocular hypertension, bilateral: Secondary | ICD-10-CM | POA: Insufficient documentation

## 2018-10-30 ENCOUNTER — Other Ambulatory Visit: Payer: Self-pay | Admitting: Adult Health

## 2018-10-30 MED ORDER — LORAZEPAM 0.5 MG PO TABS: 0.5000 mg | ORAL_TABLET | Freq: Two times a day (BID) | ORAL | 0 refills | Status: DC

## 2018-10-31 DIAGNOSIS — B351 Tinea unguium: Secondary | ICD-10-CM | POA: Diagnosis not present

## 2018-10-31 DIAGNOSIS — I739 Peripheral vascular disease, unspecified: Secondary | ICD-10-CM | POA: Diagnosis not present

## 2018-10-31 DIAGNOSIS — G629 Polyneuropathy, unspecified: Secondary | ICD-10-CM | POA: Diagnosis not present

## 2018-11-14 ENCOUNTER — Encounter: Payer: Self-pay | Admitting: Adult Health

## 2018-11-14 ENCOUNTER — Non-Acute Institutional Stay (SKILLED_NURSING_FACILITY): Payer: Medicare Other | Admitting: Adult Health

## 2018-11-14 DIAGNOSIS — N183 Chronic kidney disease, stage 3 unspecified: Secondary | ICD-10-CM

## 2018-11-14 DIAGNOSIS — I130 Hypertensive heart and chronic kidney disease with heart failure and stage 1 through stage 4 chronic kidney disease, or unspecified chronic kidney disease: Secondary | ICD-10-CM

## 2018-11-14 DIAGNOSIS — I5032 Chronic diastolic (congestive) heart failure: Secondary | ICD-10-CM

## 2018-11-14 DIAGNOSIS — Z9981 Dependence on supplemental oxygen: Secondary | ICD-10-CM | POA: Diagnosis not present

## 2018-11-14 DIAGNOSIS — K219 Gastro-esophageal reflux disease without esophagitis: Secondary | ICD-10-CM | POA: Diagnosis not present

## 2018-11-14 DIAGNOSIS — J9611 Chronic respiratory failure with hypoxia: Secondary | ICD-10-CM

## 2018-11-14 NOTE — Progress Notes (Signed)
Location:   [facility] Room Number: 59 D Place of Service:  SNF (31)   CODE STATUS: DNR  Allergies  Allergen Reactions  . Bee Venom Anaphylaxis  . Ace Inhibitors   . Angiotensin Receptor Blockers     Tongue swelling.  . Aspirin     Directed by physician  . Cabbage Other (See Comments)    Certain foods due to gout flares  . Latex Rash    Chief Complaint  Patient presents with  . Medical Management of Chronic Issues    Hypertensive heart and kidney disease with chronic diastolic congestive heart failure and stage 3 chronic kidney disease; chronic respiratory failure iwht hypoxia on home 02 therapy; gastroesophageal esophagitis.     HPI:  She is a 85 year old term resident of this facility being seen for the management of her chronic illnesses: hypertensive heart disease; respiratory failure; gerd. She has been feeling like she is choking on solid foods; is able to tolerate applesauce due to her gerd. She denies any cough or shortness of breath; no chest pain no fevers   Past Medical History:  Diagnosis Date  . Anxiety   . Cellulitis 2016  . CHF (congestive heart failure) ([place])   . Chronic kidney disease   . Dementia ([place])   . Dysphagia   . Gait abnormality   . GERD (gastroesophageal reflux disease)   . Glaucoma   . Gout   . Gout, unspecified 01/12/2010   Qualifier: Diagnosis of  By: Moshe Cipro MD, Joycelyn Schmid    . Hyperlipidemia   . Hypertension   . Muscle weakness   . Myocardial infarction [facility]) [place]  . Osteoarthritis   . Syncope     Past Surgical History:  Procedure Laterality Date  . APPENDECTOMY    . CATARACT EXTRACTION W/PHACO  08/04/2011   Procedure: CATARACT EXTRACTION PHACO AND INTRAOCULAR LENS PLACEMENT (IOC);  Surgeon: Tonny Branch;  Location: [facility];  Service: Ophthalmology;  Laterality: Right;  CDE=18.53  . CATARACT EXTRACTION W/PHACO  08/25/2011   Procedure: CATARACT EXTRACTION PHACO AND INTRAOCULAR LENS PLACEMENT  (IOC);  Surgeon: Tonny Branch;  Location: [facility];  Service: Ophthalmology;  Laterality: Left;  CDE:14.76  . TONSILLECTOMY      Social History   Socioeconomic History  . Marital status: Divorced    Spouse name: Not on file  . Number of children: Not on file  . Years of education: Not on file  . Highest education level: Not on file  Occupational History  . Occupation: retired   Scientific laboratory technician  . Financial resource strain: Not hard at all  . Food insecurity:    Worry: Never true    Inability: Never true  . Transportation needs:    Medical: No    Non-medical: No  Tobacco Use  . Smoking status: Never Smoker  . Smokeless tobacco: Never Used  Substance and Sexual Activity  . Alcohol use: No  . Drug use: No  . Sexual activity: Not Currently  Lifestyle  . Physical activity:    Days per week: 0 days    Minutes per session: 0 min  . Stress: Only a little  Relationships  . Social connections:    Talks on phone: More than three times a week    Gets together: More than three times a week    Attends religious service: Never    Active member of club or organization: No    Attends meetings of clubs  or organizations: Never    Relationship status: Divorced  . Intimate partner violence:    Fear of current or ex partner: No    Emotionally abused: No    Physically abused: No    Forced sexual activity: No  Other Topics Concern  . Not on file  Social History Narrative  . Not on file   Family History  Problem Relation Age of Onset  . Diabetes Father   . Diabetes Sister   . Diabetes Brother   . Stroke Sister   . Stroke Sister   . Anesthesia problems Neg Hx   . Hypotension Neg Hx   . Malignant hyperthermia Neg Hx   . Pseudochol deficiency Neg Hx       VITAL SIGNS Ht 4\' 11"  (1.499 m)   Wt 180 lb 6.4 oz (81.8 kg)   BMI 36.44 kg/m   Outpatient Encounter Medications as of 11/14/2018  Medication Sig  . acetaminophen (TYLENOL) 500 MG tablet Take 1,000 mg by mouth 2 (two) times  daily. For leg pain and arthritis. May have additional 1000 mg for break through pain once a day prn  . amLODipine (NORVASC) 5 MG tablet Take 5 mg by mouth daily.  . Brinzolamide-Brimonidine (SIMBRINZA) 1-0.2 % SUSP One  drop into both eyes three times a day   . Calcium Carbonate-Vitamin D (CALCIUM-VITAMIN D) 500-200 MG-UNIT tablet Take 1 tablet by mouth 2 (two) times daily.  . citalopram (CELEXA) 20 MG tablet Take 20 mg by mouth daily.  . cycloSPORINE (RESTASIS) 0.05 % ophthalmic emulsion Place 1 drop into both eyes 2 (two) times daily.  Marland Kitchen docusate sodium (COLACE) 100 MG capsule Take 100 mg by mouth 2 (two) times daily.  Marland Kitchen donepezil (ARICEPT) 10 MG tablet Take 5 mg by mouth daily. For dementia  . hydrALAZINE (APRESOLINE) 25 MG tablet Take 1Tablet 50 mg by mouth twice a day  . hydrochlorothiazide (HYDRODIURIL) 12.5 MG tablet Take 12.5 mg by mouth daily.  Marland Kitchen ipratropium-albuterol (DUONEB) 0.5-2.5 (3) MG/3ML SOLN Take 3 mLs by nebulization every 6 (six) hours as needed.  . loratadine (CLARITIN) 10 MG tablet Take 10 mg by mouth daily.  Marland Kitchen LORazepam (ATIVAN) 0.5 MG tablet Take 1 tablet (0.5 mg total) by mouth 2 (two) times daily.  . nitroGLYCERIN (NITROSTAT) 0.4 MG SL tablet Place 0.4 mg under the tongue every 5 (five) minutes as needed for chest pain.   . NON FORMULARY Diet Type:  Regular, NAS  . OXYGEN Inhale 2 L/min into the lungs continuous. Wean to keep sats above 90%  . pantoprazole (PROTONIX) 20 MG tablet Take 20 mg by mouth 2 (two) times daily. Do not crush.   . potassium chloride (K-DUR,KLOR-CON) 10 MEQ tablet Take 10 mEq by mouth daily.  . Saline (OCEAN NASAL SPRAY NA) Place 1 spray into the nose every 2 (two) hours as needed.  . Travoprost, BAK Free, (TRAVATAN) 0.004 % SOLN ophthalmic solution Place 1 drop into both eyes at bedtime. For glaucoma  . [DISCONTINUED] lovastatin (MEVACOR) 40 MG tablet Take 40 mg by mouth at bedtime.   No facility-administered encounter medications on file as of  11/14/2018.      SIGNIFICANT DIAGNOSTIC EXAMS  LABS REVIEWED PREVIOUS:   06-29-18: wbc 4.1; hgb 11.1; hct 36.4; mcv 84.3; plt 224; glucose 94; bun 41; creat 1.38; k+ 3.8; na++ 140; ca 9.0; liver normal albumin 3.9  09-04-18: wbc 4.5; hgb 10.5; hc6 34.8; mcv 85.1 plt 188 glucose 93; bun 27; creat 1.09; k+ 3.6; na++  139; ca 9.7   NO NEW LABS.    Review of Systems  Constitutional: Negative for malaise/fatigue.  Respiratory: Negative for cough and shortness of breath.   Cardiovascular: Negative for chest pain, palpitations and leg swelling.  Gastrointestinal: Positive for heartburn. Negative for abdominal pain and constipation.       Has a choking sensation when eating solids   Musculoskeletal: Negative for back pain, joint pain and myalgias.  Skin: Negative.   Neurological: Negative for dizziness.  Psychiatric/Behavioral: The patient is not nervous/anxious.     Physical Exam Constitutional:      General: She is not in acute distress.    Appearance: She is well-developed. She is not diaphoretic.  Eyes:     Comments: History of bilateral cataract removal with lens implant    Neck:     Musculoskeletal: Neck supple.     Thyroid: No thyromegaly.  Cardiovascular:     Rate and Rhythm: Normal rate and regular rhythm.     Heart sounds: Murmur present.     Comments: 3/6 Post tibs present bilaterally Pedal pulses faint  Pulmonary:     Effort: Pulmonary effort is normal. No respiratory distress.     Breath sounds: Normal breath sounds.  Abdominal:     General: Bowel sounds are normal. There is no distension.     Palpations: Abdomen is soft.     Tenderness: There is no abdominal tenderness.  Musculoskeletal:     Right lower leg: Edema present.     Left lower leg: Edema present.     Comments: Is able to move all extremities Uses walker Has 2+ bilateral lower extremity edema   Lymphadenopathy:     Cervical: No cervical adenopathy.  Skin:    General: Skin is warm and dry.      Comments: Bilateral lower extremities discolored   Neurological:     Mental Status: She is alert. Mental status is at baseline.     ASSESSMENT/ PLAN:  TODAY:   1. Hypertensive heart and kidney disease with chronic diastolic congestive heart failure and stage 2 chronic kidney disease: is stable will continue hctz 12.5 mg daily apresoline 50 mg twice daily will stop norvasc due to her edema and will monitor  Will check b/p daily for 2 weeks.    2. Chronic respiratory failure with hypoxia on home 02 therapy: is stable is 02 dependent will continue duoneb every 6 hours as needed claritin 10 mg daily   3. GERD without esophagitis: is worse will increase protonix to 40 mg twice daily and will have speech therapy evaluate and treat as indicated   PREVIOUS   4. Chronic constipation: is stable will continue colace twice daily  5. Vascular dementia with behavioral disturbance: is without change: weight is 182 pounds; albumin 3.9; will continue aricept 5 mg daily   6. Primary osteoarthritis multiple joints: is stable will continue tylenol 1 gm twice daily   7. CKD (chronic kidney disease) stage 3 GFR 30-59 ml/min: is stable bun 27; creat 1.09  8. Depression with anxiety: is stable will continue celexa 20 mg daily and ativan 0.5 mg twice daily for anxiety  9. Hyperlipidemia: is stable is off mevacor will not make changes will monitor   10. Increased intraocular pressure; bilateral: is stable will continue simbrinza to both eyes three times daily and travatan to both eyes nightly   11. Chronic diastolic CF (congestive heart failure) EF 65-70% (03-04-18); is stable will continue apresoline 50 mg twice daily has prn  ntg for chest pain   12. Deep vein thrombosis (DVT) of left lower extremity unspecified chronicity unspecified vein: is stable will monitor  13. Chronic non-seasonal allergic rhinitis: is stable will continue claritin 10 mg  14. Hypokalemia: is stable will continue k+ 10 meq daily     MD is aware of resident's narcotic use and is in agreement with current plan of care. We will attempt to wean resident as apropriate   Ok Edwards NP [facility]  Contact [phone] Monday through Friday 8am- 5pm  After hours call [phone]

## 2018-11-30 DIAGNOSIS — F411 Generalized anxiety disorder: Secondary | ICD-10-CM | POA: Diagnosis not present

## 2018-11-30 DIAGNOSIS — F0150 Vascular dementia without behavioral disturbance: Secondary | ICD-10-CM | POA: Diagnosis not present

## 2018-12-05 DIAGNOSIS — I5032 Chronic diastolic (congestive) heart failure: Secondary | ICD-10-CM | POA: Diagnosis not present

## 2018-12-05 DIAGNOSIS — M129 Arthropathy, unspecified: Secondary | ICD-10-CM | POA: Diagnosis not present

## 2018-12-05 DIAGNOSIS — R279 Unspecified lack of coordination: Secondary | ICD-10-CM | POA: Diagnosis not present

## 2018-12-05 DIAGNOSIS — M6281 Muscle weakness (generalized): Secondary | ICD-10-CM | POA: Diagnosis not present

## 2018-12-06 DIAGNOSIS — R279 Unspecified lack of coordination: Secondary | ICD-10-CM | POA: Diagnosis not present

## 2018-12-06 DIAGNOSIS — M129 Arthropathy, unspecified: Secondary | ICD-10-CM | POA: Diagnosis not present

## 2018-12-06 DIAGNOSIS — I5032 Chronic diastolic (congestive) heart failure: Secondary | ICD-10-CM | POA: Diagnosis not present

## 2018-12-06 DIAGNOSIS — M6281 Muscle weakness (generalized): Secondary | ICD-10-CM | POA: Diagnosis not present

## 2018-12-07 DIAGNOSIS — M129 Arthropathy, unspecified: Secondary | ICD-10-CM | POA: Diagnosis not present

## 2018-12-07 DIAGNOSIS — I5032 Chronic diastolic (congestive) heart failure: Secondary | ICD-10-CM | POA: Diagnosis not present

## 2018-12-07 DIAGNOSIS — M6281 Muscle weakness (generalized): Secondary | ICD-10-CM | POA: Diagnosis not present

## 2018-12-07 DIAGNOSIS — R279 Unspecified lack of coordination: Secondary | ICD-10-CM | POA: Diagnosis not present

## 2018-12-10 DIAGNOSIS — M129 Arthropathy, unspecified: Secondary | ICD-10-CM | POA: Diagnosis not present

## 2018-12-10 DIAGNOSIS — I5032 Chronic diastolic (congestive) heart failure: Secondary | ICD-10-CM | POA: Diagnosis not present

## 2018-12-10 DIAGNOSIS — R279 Unspecified lack of coordination: Secondary | ICD-10-CM | POA: Diagnosis not present

## 2018-12-10 DIAGNOSIS — M6281 Muscle weakness (generalized): Secondary | ICD-10-CM | POA: Diagnosis not present

## 2018-12-11 DIAGNOSIS — M129 Arthropathy, unspecified: Secondary | ICD-10-CM | POA: Diagnosis not present

## 2018-12-11 DIAGNOSIS — M6281 Muscle weakness (generalized): Secondary | ICD-10-CM | POA: Diagnosis not present

## 2018-12-11 DIAGNOSIS — I5032 Chronic diastolic (congestive) heart failure: Secondary | ICD-10-CM | POA: Diagnosis not present

## 2018-12-11 DIAGNOSIS — R279 Unspecified lack of coordination: Secondary | ICD-10-CM | POA: Diagnosis not present

## 2018-12-12 ENCOUNTER — Other Ambulatory Visit: Payer: Self-pay | Admitting: Adult Health

## 2018-12-12 DIAGNOSIS — R279 Unspecified lack of coordination: Secondary | ICD-10-CM | POA: Diagnosis not present

## 2018-12-12 DIAGNOSIS — M129 Arthropathy, unspecified: Secondary | ICD-10-CM | POA: Diagnosis not present

## 2018-12-12 DIAGNOSIS — M6281 Muscle weakness (generalized): Secondary | ICD-10-CM | POA: Diagnosis not present

## 2018-12-12 DIAGNOSIS — I5032 Chronic diastolic (congestive) heart failure: Secondary | ICD-10-CM | POA: Diagnosis not present

## 2018-12-12 MED ORDER — LORAZEPAM 0.5 MG PO TABS: 0.5000 mg | ORAL_TABLET | Freq: Two times a day (BID) | ORAL | 0 refills | Status: DC

## 2018-12-13 DIAGNOSIS — M6281 Muscle weakness (generalized): Secondary | ICD-10-CM | POA: Diagnosis not present

## 2018-12-13 DIAGNOSIS — I5032 Chronic diastolic (congestive) heart failure: Secondary | ICD-10-CM | POA: Diagnosis not present

## 2018-12-13 DIAGNOSIS — R279 Unspecified lack of coordination: Secondary | ICD-10-CM | POA: Diagnosis not present

## 2018-12-13 DIAGNOSIS — M129 Arthropathy, unspecified: Secondary | ICD-10-CM | POA: Diagnosis not present

## 2018-12-14 DIAGNOSIS — M6281 Muscle weakness (generalized): Secondary | ICD-10-CM | POA: Diagnosis not present

## 2018-12-14 DIAGNOSIS — I5032 Chronic diastolic (congestive) heart failure: Secondary | ICD-10-CM | POA: Diagnosis not present

## 2018-12-14 DIAGNOSIS — M129 Arthropathy, unspecified: Secondary | ICD-10-CM | POA: Diagnosis not present

## 2018-12-14 DIAGNOSIS — R279 Unspecified lack of coordination: Secondary | ICD-10-CM | POA: Diagnosis not present

## 2018-12-17 ENCOUNTER — Non-Acute Institutional Stay (SKILLED_NURSING_FACILITY): Payer: Medicare Other | Admitting: Internal Medicine

## 2018-12-17 ENCOUNTER — Encounter: Payer: Self-pay | Admitting: Internal Medicine

## 2018-12-17 DIAGNOSIS — I5032 Chronic diastolic (congestive) heart failure: Secondary | ICD-10-CM

## 2018-12-17 DIAGNOSIS — N183 Chronic kidney disease, stage 3 unspecified: Secondary | ICD-10-CM

## 2018-12-17 DIAGNOSIS — M129 Arthropathy, unspecified: Secondary | ICD-10-CM | POA: Diagnosis not present

## 2018-12-17 DIAGNOSIS — K219 Gastro-esophageal reflux disease without esophagitis: Secondary | ICD-10-CM | POA: Diagnosis not present

## 2018-12-17 DIAGNOSIS — M6281 Muscle weakness (generalized): Secondary | ICD-10-CM | POA: Diagnosis not present

## 2018-12-17 DIAGNOSIS — I10 Essential (primary) hypertension: Secondary | ICD-10-CM

## 2018-12-17 DIAGNOSIS — R279 Unspecified lack of coordination: Secondary | ICD-10-CM | POA: Diagnosis not present

## 2018-12-17 NOTE — Progress Notes (Signed)
Location:  [facility] Room Number: [place] of Service:  SNF [phone]) Provider:  Veleta Miners, MD [name], MD  Patient Care Team: [name], MD as PCP - General (Internal Medicine) [name], MD as Consulting Physician (Geriatric Medicine) [name] as Physician Assistant (Internal Medicine)  Extended Emergency Contact Information Primary Emergency Contact: [name], [place] [address] [name] of [place] Phone: [phone] Relation: Daughter Secondary Emergency Contact: [name] [place] of [place] Phone: [phone] Relation: Son  Code Status:  DNR Goals of care: Advanced Directive information Advanced Directives 12/17/2018  Does Patient Have a Medical Advance Directive? Yes  Type of Advance Directive Out of facility DNR (pink MOST or yellow form)  Does patient want to make changes to medical advance directive? No - Patient declined  Copy of [place] in Chart? -  Would patient like information on creating a medical advance directive? No - Patient declined  Pre-existing out of facility DNR order (yellow form or pink MOST form) Yellow form placed in chart (order not valid for inpatient use)     Chief Complaint  Patient presents with  . Medical Management of Chronic Issues    Hypertension, Depression with Anxiety    HPI:  Pt is a 85 y.o. female seen today for medical management of chronic diseases.   Patient has H/o Hypertension, Distal DVT, Chronic venous stasis,  Diastolic CHF chronic Renal disaese and GERD,Glaucoma,Dementia and Anxiety  She is Long term Resident of facility No Acute issues per nurses. Doing well. Has gained some weight and is up to 179 lbs. No Cough or SOB. Uses Oxygen 2l PRN No new Nursing Issues    Past Medical History:  Diagnosis Date  . Anxiety   . Cellulitis 2016  . CHF (congestive heart failure) ([place])   . Chronic kidney  disease   . Dementia ([place])   . Dysphagia   . Gait abnormality   . GERD (gastroesophageal reflux disease)   . Glaucoma   . Gout   . Gout, unspecified 01/12/2010   Qualifier: Diagnosis of  By: Moshe Cipro MD, Joycelyn Schmid    . Hyperlipidemia   . Hypertension   . Muscle weakness   . Myocardial infarction [facility]) [place]  . Osteoarthritis   . Syncope    Past Surgical History:  Procedure Laterality Date  . APPENDECTOMY    . CATARACT EXTRACTION W/PHACO  08/04/2011   Procedure: CATARACT EXTRACTION PHACO AND INTRAOCULAR LENS PLACEMENT (IOC);  Surgeon: Tonny Branch;  Location: [facility];  Service: Ophthalmology;  Laterality: Right;  CDE=18.53  . CATARACT EXTRACTION W/PHACO  08/25/2011   Procedure: CATARACT EXTRACTION PHACO AND INTRAOCULAR LENS PLACEMENT (IOC);  Surgeon: Tonny Branch;  Location: [facility];  Service: Ophthalmology;  Laterality: Left;  CDE:14.76  . TONSILLECTOMY      Allergies  Allergen Reactions  . Bee Venom Anaphylaxis  . Ace Inhibitors   . Angiotensin Receptor Blockers     Tongue swelling.  . Aspirin     Directed by physician  . Cabbage Other (See Comments)    Certain foods due to gout flares  . Latex Rash    Outpatient Encounter Medications as of 12/17/2018  Medication Sig  . acetaminophen (TYLENOL) 500 MG tablet Take 1,000 mg by mouth 2 (two) times daily. For leg pain and arthritis. May have additional 1000 mg for break through pain once  a day prn  . Brinzolamide-Brimonidine (SIMBRINZA) 1-0.2 % SUSP One  drop into both eyes three times a day   . Calcium Carbonate-Vitamin D (CALCIUM-VITAMIN D) 500-200 MG-UNIT tablet Take 1 tablet by mouth 2 (two) times daily.  . citalopram (CELEXA) 20 MG tablet Take 20 mg by mouth daily.  . cycloSPORINE (RESTASIS) 0.05 % ophthalmic emulsion Place 1 drop into both eyes 2 (two) times daily.  Marland Kitchen docusate sodium (COLACE) 100 MG capsule Take 100 mg by mouth 2 (two) times daily.  Marland Kitchen donepezil (ARICEPT) 10 MG tablet Take 5 mg by mouth daily. For  dementia  . hydrALAZINE (APRESOLINE) 25 MG tablet Take 1Tablet 50 mg by mouth twice a day  . hydrochlorothiazide (HYDRODIURIL) 12.5 MG tablet Take 12.5 mg by mouth daily.  Marland Kitchen ipratropium-albuterol (DUONEB) 0.5-2.5 (3) MG/3ML SOLN Take 3 mLs by nebulization every 6 (six) hours as needed.  . loratadine (CLARITIN) 10 MG tablet Take 10 mg by mouth daily.  Marland Kitchen LORazepam (ATIVAN) 0.5 MG tablet Take 1 tablet (0.5 mg total) by mouth 2 (two) times daily.  . nitroGLYCERIN (NITROSTAT) 0.4 MG SL tablet Place 0.4 mg under the tongue every 5 (five) minutes as needed for chest pain.   . NON FORMULARY Diet Type:  Regular, NAS  . OXYGEN Inhale 2 L/min into the lungs continuous. Wean to keep sats above 90%  . pantoprazole (PROTONIX) 40 MG tablet Take 40 mg by mouth 2 (two) times daily. Do not crush.   . potassium chloride (K-DUR,KLOR-CON) 10 MEQ tablet Take 10 mEq by mouth daily.  . Saline (OCEAN NASAL SPRAY NA) Place 1 spray into the nose every 2 (two) hours as needed.  . Travoprost, BAK Free, (TRAVATAN) 0.004 % SOLN ophthalmic solution Place 1 drop into both eyes at bedtime. For glaucoma  . [DISCONTINUED] amLODipine (NORVASC) 5 MG tablet Take 5 mg by mouth daily.   No facility-administered encounter medications on file as of 12/17/2018.     Review of Systems  Constitutional: Negative.   HENT: Negative.   Respiratory: Positive for shortness of breath.   Cardiovascular: Positive for leg swelling.  Gastrointestinal: Negative.   Genitourinary: Positive for frequency.  Musculoskeletal: Negative.   Skin: Positive for color change.  Neurological: Positive for weakness.  Psychiatric/Behavioral: Negative.     Immunization History  Administered Date(s) Administered  . Influenza Whole 06/11/2007, 10/16/2008, 05/21/2009, 05/05/2010  . Influenza-Unspecified 06/05/2014, 06/02/2016, 06/02/2017, 05/31/2018  . Pneumococcal Conjugate-13 06/06/2016  . Pneumococcal Polysaccharide-23 06/11/2007, 06/01/2016, 03/04/2018   . Pneumococcal-Unspecified 06/06/2016  . Td 06/11/2007  . Tdap 06/24/2017  . Zoster 05/21/2007   Pertinent  Health Maintenance Due  Topic Date Due  . PNA vac Low Risk Adult (2 of 2 - PCV13) 03/05/2019  . INFLUENZA VACCINE  03/30/2019  . DEXA SCAN  Completed   Fall Risk  05/31/2018 05/22/2017  Falls in the past year? No No   Functional Status Survey:    Vitals:   12/17/18 0928  BP: 136/70  Pulse: (!) 58  Resp: 16  Temp: (!) 97.4 F (36.3 C)  SpO2: 96%  Weight: 179 lb 3.2 oz (81.3 kg)  Height: 4\' 11"  (1.499 m)   Body mass index is 36.19 kg/m. Physical Exam Constitutional:      Appearance: She is well-developed.  HENT:     Head: Normocephalic.  Eyes:     Pupils: Pupils are equal, round, and reactive to light.  Neck:     Musculoskeletal: Neck supple.  Cardiovascular:     Rate and  Rhythm: Normal rate and regular rhythm.  Pulmonary:     Effort: Pulmonary effort is normal. No respiratory distress.     Breath sounds: Normal breath sounds. No stridor. No wheezing.  Abdominal:     General: Bowel sounds are normal. There is no distension.     Palpations: Abdomen is soft.     Tenderness: There is no abdominal tenderness. There is no guarding.  Musculoskeletal:     Comments: Mild edema Bilateral and Chronic Venous Changes bilateral  Skin:    General: Skin is warm and dry.  Neurological:     Mental Status: She is alert and oriented to person, place, and time.     Comments: No Focal deficits Patient walks with Assist and her Walker  Psychiatric:        Behavior: Behavior normal.        Thought Content: Thought content normal.     Labs reviewed: Recent Labs    03/05/18 0515  04/09/18 0600 06/29/18 0700 09/04/18 0700  NA 140   < > 143 140 139  K 4.0   < > 3.8 3.8 3.6  CL 102   < > 106 104 99  CO2 30   < > 29 28 31   GLUCOSE 184*   < > 90 94 93  BUN 29*   < > 42* 41* 27*  CREATININE 1.05*   < > 1.24* 1.38* 1.09*  CALCIUM 8.6*   < > 9.4 9.3 9.7  MG 2.2  --   --    --   --    < > = values in this interval not displayed.   Recent Labs    03/03/18 1008 06/29/18 0700  AST 19 17  ALT 12 12  ALKPHOS 65 78  BILITOT 0.7 0.7  PROT 7.0 7.6  ALBUMIN 3.5 3.9   Recent Labs    03/03/18 1008  03/15/18 0700 06/29/18 0700 09/04/18 0700  WBC 5.3   < > 7.3 4.1 4.5  NEUTROABS 4.1  --  3.9  --  2.9  HGB 10.2*   < > 11.6* 11.1* 10.5*  HCT 31.9*   < > 36.1 36.4 34.8*  MCV 85.5   < > 83.2 84.3 85.1  PLT 174   < > 249 224 188   < > = values in this interval not displayed.   Lab Results  Component Value Date   TSH 0.369 03/05/2018   Lab Results  Component Value Date   HGBA1C 5.5 03/20/2017   Lab Results  Component Value Date   CHOL 234 (H) 09/22/2017   HDL 122 09/22/2017   LDLCALC 99 09/22/2017   TRIG 65 09/22/2017   CHOLHDL 1.9 09/22/2017    Significant Diagnostic Results in last 30 days:  No results found.  Assessment/Plan  Essential Hypertension She has very Labile BP with swings of SBP of 180-120 She has been doing well on hydralazine 50 mg bid dose She is also on low dose of HCTZ as she refuses Lasix and high dose due to Urinary frequency.   Chronic diastolic congestive heart failure Has gained some weight and has swelling in her legs She refuses Lasix Stage 3 chronic kidney disease Creatinine is staying stable.  Anxiety state Doing well on Ativan and Celexa No GDR right now  GERD Change her Protonix toBID Reducing the dose caused her symptoms to come back Dementia stable on Aricept    Family/ staff Communication:   Labs/tests ordered:   Total time spent in  this patient care encounter was  25_  minutes; greater than 50% of the visit spent counseling patient and staff, reviewing records , Labs and coordinating care for problems addressed at this encounter.

## 2018-12-18 DIAGNOSIS — R279 Unspecified lack of coordination: Secondary | ICD-10-CM | POA: Diagnosis not present

## 2018-12-18 DIAGNOSIS — M6281 Muscle weakness (generalized): Secondary | ICD-10-CM | POA: Diagnosis not present

## 2018-12-18 DIAGNOSIS — M129 Arthropathy, unspecified: Secondary | ICD-10-CM | POA: Diagnosis not present

## 2018-12-18 DIAGNOSIS — I5032 Chronic diastolic (congestive) heart failure: Secondary | ICD-10-CM | POA: Diagnosis not present

## 2018-12-19 ENCOUNTER — Encounter: Payer: Self-pay | Admitting: Adult Health

## 2018-12-19 ENCOUNTER — Non-Acute Institutional Stay (SKILLED_NURSING_FACILITY): Payer: Medicare Other | Admitting: Adult Health

## 2018-12-19 DIAGNOSIS — F0151 Vascular dementia with behavioral disturbance: Secondary | ICD-10-CM

## 2018-12-19 DIAGNOSIS — J9611 Chronic respiratory failure with hypoxia: Secondary | ICD-10-CM

## 2018-12-19 DIAGNOSIS — N183 Chronic kidney disease, stage 3 unspecified: Secondary | ICD-10-CM

## 2018-12-19 DIAGNOSIS — M129 Arthropathy, unspecified: Secondary | ICD-10-CM | POA: Diagnosis not present

## 2018-12-19 DIAGNOSIS — R279 Unspecified lack of coordination: Secondary | ICD-10-CM | POA: Diagnosis not present

## 2018-12-19 DIAGNOSIS — I5032 Chronic diastolic (congestive) heart failure: Secondary | ICD-10-CM | POA: Diagnosis not present

## 2018-12-19 DIAGNOSIS — M6281 Muscle weakness (generalized): Secondary | ICD-10-CM | POA: Diagnosis not present

## 2018-12-19 DIAGNOSIS — F01518 Vascular dementia, unspecified severity, with other behavioral disturbance: Secondary | ICD-10-CM

## 2018-12-19 DIAGNOSIS — Z9981 Dependence on supplemental oxygen: Secondary | ICD-10-CM

## 2018-12-19 NOTE — Progress Notes (Signed)
Location:    [facility] Room Number: 122/D Place of Service:  SNF (31)   CODE STATUS: DNR  Allergies  Allergen Reactions  . Bee Venom Anaphylaxis  . Ace Inhibitors   . Angiotensin Receptor Blockers     Tongue swelling.  . Aspirin     Directed by physician  . Cabbage Other (See Comments)    Certain foods due to gout flares  . Latex Rash    Chief Complaint  Patient presents with  . Acute Visit    Care Plan Meeting    HPI:  We have come together for her routine care plan meeting. There are no changes in her BIMS score; no significant change in weights; no changes in mood. No falls; no uncontrolled pain. No reports of fevers.    Past Medical History:  Diagnosis Date  . Anxiety   . Cellulitis 2016  . CHF (congestive heart failure) ([place])   . Chronic kidney disease   . Dementia ([place])   . Dysphagia   . Gait abnormality   . GERD (gastroesophageal reflux disease)   . Glaucoma   . Gout   . Gout, unspecified 01/12/2010   Qualifier: Diagnosis of  By: Moshe Cipro MD, Joycelyn Schmid    . Hyperlipidemia   . Hypertension   . Muscle weakness   . Myocardial infarction [facility]) [place]  . Osteoarthritis   . Syncope     Past Surgical History:  Procedure Laterality Date  . APPENDECTOMY    . CATARACT EXTRACTION W/PHACO  08/04/2011   Procedure: CATARACT EXTRACTION PHACO AND INTRAOCULAR LENS PLACEMENT (IOC);  Surgeon: Tonny Branch;  Location: [facility] ORS;  Service: Ophthalmology;  Laterality: Right;  CDE=18.53  . CATARACT EXTRACTION W/PHACO  08/25/2011   Procedure: CATARACT EXTRACTION PHACO AND INTRAOCULAR LENS PLACEMENT (IOC);  Surgeon: Tonny Branch;  Location: [facility] ORS;  Service: Ophthalmology;  Laterality: Left;  CDE:14.76  . TONSILLECTOMY      Social History   Socioeconomic History  . Marital status: Divorced    Spouse name: Not on file  . Number of children: Not on file  . Years of education: Not on file  . Highest education level: Not on file  Occupational  History  . Occupation: retired   Scientific laboratory technician  . Financial resource strain: Not hard at all  . Food insecurity:    Worry: Never true    Inability: Never true  . Transportation needs:    Medical: No    Non-medical: No  Tobacco Use  . Smoking status: Never Smoker  . Smokeless tobacco: Never Used  Substance and Sexual Activity  . Alcohol use: No  . Drug use: No  . Sexual activity: Not Currently  Lifestyle  . Physical activity:    Days per week: 0 days    Minutes per session: 0 min  . Stress: Only a little  Relationships  . Social connections:    Talks on phone: More than three times a week    Gets together: More than three times a week    Attends religious service: Never    Active member of club or organization: No    Attends meetings of clubs or organizations: Never    Relationship status: Divorced  . Intimate partner violence:    Fear of current or ex partner: No    Emotionally abused: No    Physically abused: No    Forced sexual activity: No  Other Topics Concern  . Not on file  Social History Narrative  . Not on file   Family History  Problem Relation Age of Onset  . Diabetes Father   . Diabetes Sister   . Diabetes Brother   . Stroke Sister   . Stroke Sister   . Anesthesia problems Neg Hx   . Hypotension Neg Hx   . Malignant hyperthermia Neg Hx   . Pseudochol deficiency Neg Hx       VITAL SIGNS BP 136/70   Pulse (!) 58   Temp 99.1 F (37.3 C) (Oral)   Resp 16   Ht 4\' 11"  (1.499 m)   Wt 179 lb 3.2 oz (81.3 kg)   SpO2 98%   BMI 36.19 kg/m   Outpatient Encounter Medications as of 12/19/2018  Medication Sig  . acetaminophen (TYLENOL) 500 MG tablet Take 1,000 mg by mouth 2 (two) times daily. For leg pain and arthritis. May have additional 1000 mg for break through pain once a day prn  . Brinzolamide-Brimonidine (SIMBRINZA) 1-0.2 % SUSP One  drop into both eyes three times a day   . Calcium Carbonate-Vitamin D (CALCIUM-VITAMIN D) 500-200 MG-UNIT tablet  Take 1 tablet by mouth 2 (two) times daily.  . citalopram (CELEXA) 20 MG tablet Take 20 mg by mouth daily.  . cycloSPORINE (RESTASIS) 0.05 % ophthalmic emulsion Place 1 drop into both eyes 2 (two) times daily.  Marland Kitchen docusate sodium (COLACE) 100 MG capsule Take 100 mg by mouth 2 (two) times daily.  Marland Kitchen donepezil (ARICEPT) 10 MG tablet Take 5 mg by mouth daily. For dementia  . hydrALAZINE (APRESOLINE) 25 MG tablet Take 1Tablet 50 mg by mouth twice a day  . hydrochlorothiazide (HYDRODIURIL) 12.5 MG tablet Take 12.5 mg by mouth daily.  Marland Kitchen ipratropium-albuterol (DUONEB) 0.5-2.5 (3) MG/3ML SOLN Take 3 mLs by nebulization every 6 (six) hours as needed.  . loratadine (CLARITIN) 10 MG tablet Take 10 mg by mouth daily.  Marland Kitchen LORazepam (ATIVAN) 0.5 MG tablet Take 1 tablet (0.5 mg total) by mouth 2 (two) times daily.  . nitroGLYCERIN (NITROSTAT) 0.4 MG SL tablet Place 0.4 mg under the tongue every 5 (five) minutes as needed for chest pain.   . NON FORMULARY Diet Type:  Regular, NAS  . OXYGEN Inhale 2 L/min into the lungs continuous. Wean to keep sats above 90%  . pantoprazole (PROTONIX) 40 MG tablet Take 40 mg by mouth 2 (two) times daily. Do not crush.   . potassium chloride (K-DUR,KLOR-CON) 10 MEQ tablet Take 10 mEq by mouth daily.  . Saline (OCEAN NASAL SPRAY NA) Place 1 spray into the nose every 2 (two) hours as needed.  . Travoprost, BAK Free, (TRAVATAN) 0.004 % SOLN ophthalmic solution Place 1 drop into both eyes at bedtime. For glaucoma   No facility-administered encounter medications on file as of 12/19/2018.      SIGNIFICANT DIAGNOSTIC EXAMS   LABS REVIEWED PREVIOUS:   06-29-18: wbc 4.1; hgb 11.1; hct 36.4; mcv 84.3; plt 224; glucose 94; bun 41; creat 1.38; k+ 3.8; na++ 140; ca 9.0; liver normal albumin 3.9  09-04-18: wbc 4.5; hgb 10.5; hc6 34.8; mcv 85.1 plt 188 glucose 93; bun 27; creat 1.09; k+ 3.6; na++ 139; ca 9.7   NO NEW LABS.   Review of Systems  Constitutional: Negative for malaise/fatigue.   Respiratory: Negative for cough and shortness of breath.   Cardiovascular: Negative for chest pain, palpitations and leg swelling.  Gastrointestinal: Negative for abdominal pain, constipation and heartburn.  Musculoskeletal: Negative for back pain, joint pain  and myalgias.  Skin: Negative.   Neurological: Negative for dizziness.  Psychiatric/Behavioral: The patient is not nervous/anxious.     Physical Exam Constitutional:      General: She is not in acute distress.    Appearance: She is well-developed. She is not diaphoretic.  Eyes:     Comments:  History of bilateral cataract removal with lens implant     Neck:     Musculoskeletal: Neck supple.     Thyroid: No thyromegaly.  Cardiovascular:     Rate and Rhythm: Normal rate and regular rhythm.     Heart sounds: Murmur present.     Comments: Post tibs present bilaterally Pedal pulses faint  Pulmonary:     Effort: Pulmonary effort is normal. No respiratory distress.     Breath sounds: Normal breath sounds.     Comments: 02 dependent  Abdominal:     General: Bowel sounds are normal. There is no distension.     Palpations: Abdomen is soft.     Tenderness: There is no abdominal tenderness.  Musculoskeletal:     Right lower leg: No edema.     Left lower leg: No edema.     Comments:  Is able to move all extremities Uses walker Has 2+ bilateral lower extremity edema    Lymphadenopathy:     Cervical: No cervical adenopathy.  Skin:    General: Skin is warm and dry.     Comments: Bilateral lower extremities discolored   Neurological:     Mental Status: She is alert. Mental status is at baseline.  Psychiatric:        Mood and Affect: Mood normal.      ASSESSMENT/ PLAN:  TODAY:  1. Vascular dementia with behavioral disturbance 2. CKD (chronic kidney disease) stage 3 3. Chronic respiratory failure with hypoxia on home 02 therapy.   Will continue her current medications Will continue her current plan of care Will monitor  her status   MD is aware of resident's narcotic use and is in agreement with current plan of care. We will attempt to wean resident as apropriate   Ok Edwards NP [facility]  Contact [phone] Monday through Friday 8am- 5pm  After hours call [phone]

## 2018-12-20 DIAGNOSIS — R279 Unspecified lack of coordination: Secondary | ICD-10-CM | POA: Diagnosis not present

## 2018-12-20 DIAGNOSIS — M6281 Muscle weakness (generalized): Secondary | ICD-10-CM | POA: Diagnosis not present

## 2018-12-20 DIAGNOSIS — I5032 Chronic diastolic (congestive) heart failure: Secondary | ICD-10-CM | POA: Diagnosis not present

## 2018-12-20 DIAGNOSIS — M129 Arthropathy, unspecified: Secondary | ICD-10-CM | POA: Diagnosis not present

## 2018-12-21 DIAGNOSIS — M6281 Muscle weakness (generalized): Secondary | ICD-10-CM | POA: Diagnosis not present

## 2018-12-21 DIAGNOSIS — M129 Arthropathy, unspecified: Secondary | ICD-10-CM | POA: Diagnosis not present

## 2018-12-21 DIAGNOSIS — R279 Unspecified lack of coordination: Secondary | ICD-10-CM | POA: Diagnosis not present

## 2018-12-21 DIAGNOSIS — I5032 Chronic diastolic (congestive) heart failure: Secondary | ICD-10-CM | POA: Diagnosis not present

## 2018-12-24 DIAGNOSIS — R279 Unspecified lack of coordination: Secondary | ICD-10-CM | POA: Diagnosis not present

## 2018-12-24 DIAGNOSIS — M129 Arthropathy, unspecified: Secondary | ICD-10-CM | POA: Diagnosis not present

## 2018-12-24 DIAGNOSIS — M6281 Muscle weakness (generalized): Secondary | ICD-10-CM | POA: Diagnosis not present

## 2018-12-24 DIAGNOSIS — I5032 Chronic diastolic (congestive) heart failure: Secondary | ICD-10-CM | POA: Diagnosis not present

## 2018-12-25 DIAGNOSIS — R279 Unspecified lack of coordination: Secondary | ICD-10-CM | POA: Diagnosis not present

## 2018-12-25 DIAGNOSIS — I5032 Chronic diastolic (congestive) heart failure: Secondary | ICD-10-CM | POA: Diagnosis not present

## 2018-12-25 DIAGNOSIS — M129 Arthropathy, unspecified: Secondary | ICD-10-CM | POA: Diagnosis not present

## 2018-12-25 DIAGNOSIS — M6281 Muscle weakness (generalized): Secondary | ICD-10-CM | POA: Diagnosis not present

## 2018-12-26 DIAGNOSIS — I5032 Chronic diastolic (congestive) heart failure: Secondary | ICD-10-CM | POA: Diagnosis not present

## 2018-12-26 DIAGNOSIS — M6281 Muscle weakness (generalized): Secondary | ICD-10-CM | POA: Diagnosis not present

## 2018-12-26 DIAGNOSIS — R279 Unspecified lack of coordination: Secondary | ICD-10-CM | POA: Diagnosis not present

## 2018-12-26 DIAGNOSIS — M129 Arthropathy, unspecified: Secondary | ICD-10-CM | POA: Diagnosis not present

## 2018-12-28 DIAGNOSIS — F0150 Vascular dementia without behavioral disturbance: Secondary | ICD-10-CM | POA: Diagnosis not present

## 2018-12-28 DIAGNOSIS — F411 Generalized anxiety disorder: Secondary | ICD-10-CM | POA: Diagnosis not present

## 2019-01-10 ENCOUNTER — Other Ambulatory Visit: Payer: Self-pay | Admitting: Adult Health

## 2019-01-10 MED ORDER — LORAZEPAM 0.5 MG PO TABS: 0.5000 mg | ORAL_TABLET | Freq: Two times a day (BID) | ORAL | 0 refills | Status: DC

## 2019-01-15 ENCOUNTER — Encounter: Payer: Self-pay | Admitting: Adult Health

## 2019-01-15 ENCOUNTER — Non-Acute Institutional Stay (SKILLED_NURSING_FACILITY): Payer: Medicare Other | Admitting: Adult Health

## 2019-01-15 DIAGNOSIS — M159 Polyosteoarthritis, unspecified: Secondary | ICD-10-CM

## 2019-01-15 DIAGNOSIS — M150 Primary generalized (osteo)arthritis: Secondary | ICD-10-CM | POA: Diagnosis not present

## 2019-01-15 DIAGNOSIS — F0151 Vascular dementia with behavioral disturbance: Secondary | ICD-10-CM

## 2019-01-15 DIAGNOSIS — F01518 Vascular dementia, unspecified severity, with other behavioral disturbance: Secondary | ICD-10-CM

## 2019-01-15 DIAGNOSIS — N183 Chronic kidney disease, stage 3 unspecified: Secondary | ICD-10-CM

## 2019-01-15 NOTE — Progress Notes (Signed)
Location:   [facility] Room Number: 28 D Place of Service:  SNF (31)   CODE STATUS: DNR  Allergies  Allergen Reactions   Bee Venom Anaphylaxis   Ace Inhibitors    Angiotensin Receptor Blockers     Tongue swelling.   Aspirin     Directed by physician   Cabbage Other (See Comments)    Certain foods due to gout flares   Latex Rash    Chief Complaint  Patient presents with   Medical Management of Chronic Issues    Vascular dementia with behavioral disturbance; ckd (chronic kidney disease) stage 3 GFR 30-59 ml/min primary osteoarthritis involving  multiple joints     HPI:  She is a 85 year old long term resident of this facility being seen for the management of her chronic illnesses; dementia; stage 3 ckd; osteoarthritis. She denies any uncontrolled joint pain; no changes in her appetite; sleeps well at night. There are no reports of fevers present.   Past Medical History:  Diagnosis Date   Anxiety    Cellulitis 2016   CHF (congestive heart failure) ([facility])    Chronic kidney disease    Dementia ([facility])    Dysphagia    Gait abnormality    GERD (gastroesophageal reflux disease)    Glaucoma    Gout    Gout, unspecified 01/12/2010   Qualifier: Diagnosis of  By: Moshe Cipro MD, Margaret     Hyperlipidemia    Hypertension    Muscle weakness    Myocardial infarction ([place]) 1990s   [place]   Osteoarthritis    Syncope     Past Surgical History:  Procedure Laterality Date   APPENDECTOMY     CATARACT EXTRACTION W/PHACO  08/04/2011   Procedure: CATARACT EXTRACTION PHACO AND INTRAOCULAR LENS PLACEMENT ([place]);  Surgeon: Tonny Branch;  Location: [facility];  Service: Ophthalmology;  Laterality: Right;  CDE=18.53   CATARACT EXTRACTION W/PHACO  08/25/2011   Procedure: CATARACT EXTRACTION PHACO AND INTRAOCULAR LENS PLACEMENT (IOC);  Surgeon: Tonny Branch;  Location: [facility];  Service: Ophthalmology;  Laterality: Left;  CDE:14.76    TONSILLECTOMY      Social History   Socioeconomic History   Marital status: Divorced    Spouse name: Not on file   Number of children: Not on file   Years of education: Not on file   Highest education level: Not on file  Occupational History   Occupation: retired   Scientist, 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 at [employer] insecurity:    Worry: Never true    Inability: Never true   Transportation needs:    Medical: No    Non-medical: No  Tobacco Use   Smoking status: Never Smoker   Smokeless tobacco: Never Used  Substance and Sexual Activity   Alcohol use: No   Drug use: No   Sexual activity: Not Currently  Lifestyle   Physical activity:    Days per week: 0 days    Minutes per session: 0 min   Stress: Only a little  Relationships   Social connections:    Talks on phone: More than three times a week    Gets together: More than three times a week    Attends religious service: Never    Active member of club or organization: No    Attends meetings of clubs or organizations: Never    Relationship status: Divorced   Intimate partner violence:    Fear of  current or ex partner: No    Emotionally abused: No    Physically abused: No    Forced sexual activity: No  Other Topics Concern   Not on file  Social History Narrative   Not on file   Family History  Problem Relation Age of Onset   Diabetes Father    Diabetes Sister    Diabetes Brother    Stroke Sister    Stroke Sister    Anesthesia problems Neg Hx    Hypotension Neg Hx    Malignant hyperthermia Neg Hx    Pseudochol deficiency Neg Hx       VITAL SIGNS BP 133/78    Pulse 69    Temp (!) 97.5 F (36.4 C)    Resp 20    Ht 4\' 11"  (1.499 m)    Wt 178 lb 9.6 oz (81 kg)    SpO2 94%    BMI 36.07 kg/m   Outpatient Encounter Medications as of 01/15/2019  Medication Sig   acetaminophen (TYLENOL) 500 MG tablet Take 1,000 mg by mouth 2 (two) times daily. For leg pain and  arthritis. May have additional 1000 mg for break through pain once a day prn   Brinzolamide-Brimonidine (SIMBRINZA) 1-0.2 % SUSP One  drop into both eyes three times a day    Calcium Carbonate-Vitamin D (CALCIUM-VITAMIN D) 500-200 MG-UNIT tablet Take 1 tablet by mouth 2 (two) times daily.   citalopram (CELEXA) 20 MG tablet Take 20 mg by mouth daily.   cycloSPORINE (RESTASIS) 0.05 % ophthalmic emulsion Place 1 drop into both eyes 2 (two) times daily.   docusate sodium (COLACE) 100 MG capsule Take 100 mg by mouth 2 (two) times daily.   donepezil (ARICEPT) 10 MG tablet Take 5 mg by mouth daily. For dementia   hydrALAZINE (APRESOLINE) 50 MG tablet Take 50 mg by mouth 2 (two) times a day.    hydrochlorothiazide (HYDRODIURIL) 12.5 MG tablet Take 12.5 mg by mouth daily.   ipratropium-albuterol (DUONEB) 0.5-2.5 (3) MG/3ML SOLN Take 3 mLs by nebulization every 6 (six) hours as needed.   loratadine (CLARITIN) 10 MG tablet Take 10 mg by mouth daily.   LORazepam (ATIVAN) 0.5 MG tablet Take 1 tablet (0.5 mg total) by mouth 2 (two) times daily.   nitroGLYCERIN (NITROSTAT) 0.4 MG SL tablet Place 0.4 mg under the tongue every 5 (five) minutes as needed for chest pain.    NON FORMULARY Diet Type:  Regular, NAS   OXYGEN Inhale 2 L/min into the lungs continuous. Wean to keep sats above 90%   pantoprazole (PROTONIX) 40 MG tablet Take 40 mg by mouth 2 (two) times daily. Do not crush.    potassium chloride (K-DUR,KLOR-CON) 10 MEQ tablet Take 10 mEq by mouth daily.   Saline (OCEAN NASAL SPRAY NA) Place 1 spray into the nose every 2 (two) hours as needed.   Travoprost, BAK Free, (TRAVATAN) 0.004 % SOLN ophthalmic solution Place 1 drop into both eyes at bedtime. For glaucoma   No facility-administered encounter medications on file as of 01/15/2019.      SIGNIFICANT DIAGNOSTIC EXAMS  LABS REVIEWED PREVIOUS:   06-29-18: wbc 4.1; hgb 11.1; hct 36.4; mcv 84.3; plt 224; glucose 94; bun 41; creat 1.38;  k+ 3.8; na++ 140; ca 9.0; liver normal albumin 3.9  09-04-18: wbc 4.5; hgb 10.5; hc6 34.8; mcv 85.1 plt 188 glucose 93; bun 27; creat 1.09; k+ 3.6; na++ 139; ca 9.7   NO NEW LABS.    Review of Systems  Constitutional: Negative for malaise/fatigue.  Respiratory: Negative for cough and shortness of breath.   Cardiovascular: Negative for chest pain, palpitations and leg swelling.  Gastrointestinal: Negative for abdominal pain, constipation and heartburn.  Musculoskeletal: Negative for back pain, joint pain and myalgias.  Skin: Negative.   Neurological: Negative for dizziness.  Psychiatric/Behavioral: The patient is not nervous/anxious.     Physical Exam Constitutional:      General: She is not in acute distress.    Appearance: She is well-developed. She is not diaphoretic.  Eyes:     Comments: History of bilateral cataract removal with lens implant      Neck:     Thyroid: No thyromegaly.  Cardiovascular:     Rate and Rhythm: Normal rate and regular rhythm.     Heart sounds: Murmur present.     Comments: 3/6  Post tibs present bilaterally Pedal pulses faint  Pulmonary:     Effort: Pulmonary effort is normal. No respiratory distress.     Breath sounds: Normal breath sounds.     Comments: 02 dependent  Abdominal:     General: Bowel sounds are normal. There is no distension.     Palpations: Abdomen is soft.     Tenderness: There is no abdominal tenderness.  Musculoskeletal:     Right lower leg: Edema present.     Left lower leg: Edema present.     Comments:  Is able to move all extremities Has 2+ bilateral lower extremity edema     Lymphadenopathy:     Cervical: No cervical adenopathy.  Skin:    General: Skin is warm and dry.     Comments: Bilateral lower extremities discolored    Neurological:     Mental Status: She is alert. Mental status is at baseline.  Psychiatric:        Mood and Affect: Mood normal.      ASSESSMENT/ PLAN:  TODAY:   1. Vascular dementia with  behavioral disturbance is without change: weight is 178 (previous 182) pounds albumin 3.9; will continue aricept 5 mg daily   2. Primary osteoarthritis multiple joints: is stable will continue tylenol 1 gm twice daily   3. CKD (chronic kidney disease) stage 3 GFR 30-59 ml/min stable bun 27; creat 1.09   PREVIOUS   4. Chronic constipation: is stable will continue colace twice daily  5. Depression with anxiety: is stable will continue celexa 20 mg daily and ativan 0.5 mg twice daily for anxiety  6. Hyperlipidemia: is stable is off mevacor will not make changes will monitor   7. Increased intraocular pressure; bilateral: is stable will continue simbrinza to both eyes three times daily and travatan to both eyes nightly   8. Chronic diastolic CF (congestive heart failure) EF 65-70% (03-04-18); is stable will continue apresoline 50 mg twice daily has prn ntg for chest pain   9. Deep vein thrombosis (DVT) of left lower extremity unspecified chronicity unspecified vein: is stable will monitor  10. Chronic non-seasonal allergic rhinitis: is stable will continue claritin 10 mg  11. Hypokalemia: is stable will continue k+ 10 meq daily   12. Hypertensive heart and kidney disease with chronic diastolic congestive heart failure and stage 2 chronic kidney disease: is stable b/p 133/78will continue hctz 12.5 mg daily apresoline 50 mg twice daily will monitor    13. Chronic respiratory failure with hypoxia on home 02 therapy: is stable is 02 dependent will continue duoneb every 6 hours as needed claritin 10 mg daily   14.  GERD without esophagitis: is stable will continue protonix 40 mg twice daily    MD is aware of resident's narcotic use and is in agreement with current plan of care. We will attempt to wean resident as apropriate   Ok Edwards NP [facility]  Contact [phone] Monday through Friday 8am- 5pm  After hours call [phone]

## 2019-01-31 DIAGNOSIS — F0150 Vascular dementia without behavioral disturbance: Secondary | ICD-10-CM | POA: Diagnosis not present

## 2019-01-31 DIAGNOSIS — F411 Generalized anxiety disorder: Secondary | ICD-10-CM | POA: Diagnosis not present

## 2019-02-01 DIAGNOSIS — F411 Generalized anxiety disorder: Secondary | ICD-10-CM | POA: Diagnosis not present

## 2019-02-01 DIAGNOSIS — F0150 Vascular dementia without behavioral disturbance: Secondary | ICD-10-CM | POA: Diagnosis not present

## 2019-02-01 DIAGNOSIS — F331 Major depressive disorder, recurrent, moderate: Secondary | ICD-10-CM | POA: Diagnosis not present

## 2019-02-05 DIAGNOSIS — Z1159 Encounter for screening for other viral diseases: Secondary | ICD-10-CM | POA: Diagnosis not present

## 2019-02-05 DIAGNOSIS — I130 Hypertensive heart and chronic kidney disease with heart failure and stage 1 through stage 4 chronic kidney disease, or unspecified chronic kidney disease: Secondary | ICD-10-CM | POA: Diagnosis not present

## 2019-02-08 ENCOUNTER — Non-Acute Institutional Stay (SKILLED_NURSING_FACILITY): Payer: Medicare Other | Admitting: Adult Health

## 2019-02-08 ENCOUNTER — Encounter: Payer: Self-pay | Admitting: Adult Health

## 2019-02-08 DIAGNOSIS — F418 Other specified anxiety disorders: Secondary | ICD-10-CM | POA: Diagnosis not present

## 2019-02-08 DIAGNOSIS — E785 Hyperlipidemia, unspecified: Secondary | ICD-10-CM | POA: Diagnosis not present

## 2019-02-08 DIAGNOSIS — K5909 Other constipation: Secondary | ICD-10-CM

## 2019-02-08 NOTE — Progress Notes (Signed)
Location:   [place] Room Number: 3 D Place of Service:  SNF (31)   CODE STATUS: DNR  Allergies  Allergen Reactions  . Bee Venom Anaphylaxis  . Ace Inhibitors   . Angiotensin Receptor Blockers     Tongue swelling.  . Aspirin     Directed by physician  . Cabbage Other (See Comments)    Certain foods due to gout flares  . Latex Rash    Chief Complaint  Patient presents with  . Medical Management of Chronic Issues     Chronic constipation depression with anxiety:Hyperlipidemia:     HPI:  She is a 85 year old long term resident of this facility being seen for the management of her chronic illnesses: constipation; depression; hyperlipidemia. She denies any changes in her appetite; is sleeping well at night. No uncontrolled pain. There are no reports of fevers.   Past Medical History:  Diagnosis Date  . Anxiety   . Cellulitis 2016  . CHF (congestive heart failure) ([place])   . Chronic kidney disease   . Dementia ([place])   . Dysphagia   . Gait abnormality   . GERD (gastroesophageal reflux disease)   . Glaucoma   . Gout   . Gout, unspecified 01/12/2010   Qualifier: Diagnosis of  By: Moshe Cipro MD, Joycelyn Schmid    . Hyperlipidemia   . Hypertension   . Muscle weakness   . Myocardial infarction [facility]) [place]  . Osteoarthritis   . Syncope     Past Surgical History:  Procedure Laterality Date  . APPENDECTOMY    . CATARACT EXTRACTION W/PHACO  08/04/2011   Procedure: CATARACT EXTRACTION PHACO AND INTRAOCULAR LENS PLACEMENT (IOC);  Surgeon: Tonny Branch;  Location: [facility];  Service: Ophthalmology;  Laterality: Right;  CDE=18.53  . CATARACT EXTRACTION W/PHACO  08/25/2011   Procedure: CATARACT EXTRACTION PHACO AND INTRAOCULAR LENS PLACEMENT (IOC);  Surgeon: Tonny Branch;  Location: [facility] OR[facility];  Service: Ophthalmology;  Laterality: Left;  CDE:14.76  . TONSILLECTOMY      Social History   Socioeconomic History  . Marital status: Divorced    Spouse  name: Not on file  . Number of children: Not on file  . Years of education: Not on file  . Highest education level: Not on file  Occupational History  . Occupation: retired   Scientific laboratory technician  . Financial resource strain: Not hard at all  . Food insecurity    Worry: Never true    Inability: Never true  . Transportation needs    Medical: No    Non-medical: No  Tobacco Use  . Smoking status: Never Smoker  . Smokeless tobacco: Never Used  Substance and Sexual Activity  . Alcohol use: No  . Drug use: No  . Sexual activity: Not Currently  Lifestyle  . Physical activity    Days per week: 0 days    Minutes per session: 0 min  . Stress: Only a little  Relationships  . Social connections    Talks on phone: More than three times a week    Gets together: More than three times a week    Attends religious service: Never    Active member of club or organization: No    Attends meetings of clubs or organizations: Never    Relationship status: Divorced  . Intimate partner violence    Fear of current or ex partner: No    Emotionally abused: No    Physically abused: No  Forced sexual activity: No  Other Topics Concern  . Not on file  Social History Narrative  . Not on file   Family History  Problem Relation Age of Onset  . Diabetes Father   . Diabetes Sister   . Diabetes Brother   . Stroke Sister   . Stroke Sister   . Anesthesia problems Neg Hx   . Hypotension Neg Hx   . Malignant hyperthermia Neg Hx   . Pseudochol deficiency Neg Hx       VITAL SIGNS Ht 4\' 11"  (1.499 m)   Wt 171 lb 3.2 oz (77.7 kg)   BMI 34.58 kg/m   Outpatient Encounter Medications as of 02/08/2019  Medication Sig  . acetaminophen (TYLENOL) 500 MG tablet Take 1,000 mg by mouth 2 (two) times daily. For leg pain and arthritis. May have additional 1000 mg for break through pain once a day prn  . Brinzolamide-Brimonidine (SIMBRINZA) 1-0.2 % SUSP One  drop into both eyes three times a day   . Calcium  Carbonate-Vitamin D (CALCIUM-VITAMIN D) 500-200 MG-UNIT tablet Take 1 tablet by mouth 2 (two) times daily.  . citalopram (CELEXA) 20 MG tablet Take 20 mg by mouth daily.  . cycloSPORINE (RESTASIS) 0.05 % ophthalmic emulsion Place 1 drop into both eyes 2 (two) times daily.  Marland Kitchen docusate sodium (COLACE) 100 MG capsule Take 100 mg by mouth 2 (two) times daily.  Marland Kitchen donepezil (ARICEPT) 5 MG tablet Take 5 mg by mouth daily. For dementia  . hydrALAZINE (APRESOLINE) 50 MG tablet Take 50 mg by mouth 2 (two) times a day.   . hydrochlorothiazide (HYDRODIURIL) 12.5 MG tablet Take 12.5 mg by mouth daily.  Marland Kitchen ipratropium-albuterol (DUONEB) 0.5-2.5 (3) MG/3ML SOLN Take 3 mLs by nebulization every 6 (six) hours as needed.  . loratadine (CLARITIN) 10 MG tablet Take 10 mg by mouth daily.  Marland Kitchen LORazepam (ATIVAN) 0.5 MG tablet Take 1 tablet (0.5 mg total) by mouth 2 (two) times daily.  . nitroGLYCERIN (NITROSTAT) 0.4 MG SL tablet Place 0.4 mg under the tongue every 5 (five) minutes as needed for chest pain.   . NON FORMULARY Diet Type:  Regular, NAS  . OXYGEN Inhale 2 L/min into the lungs continuous. Wean to keep sats above 90%  . pantoprazole (PROTONIX) 40 MG tablet Take 40 mg by mouth 2 (two) times daily. Do not crush.   . potassium chloride (K-DUR,KLOR-CON) 10 MEQ tablet Take 10 mEq by mouth daily.  . Saline (OCEAN NASAL SPRAY NA) Place 1 spray into the nose every 2 (two) hours as needed.  . Travoprost, BAK Free, (TRAVATAN) 0.004 % SOLN ophthalmic solution Place 1 drop into both eyes at bedtime. For glaucoma   No facility-administered encounter medications on file as of 02/08/2019.      SIGNIFICANT DIAGNOSTIC EXAMS  LABS REVIEWED PREVIOUS:   06-29-18: wbc 4.1; hgb 11.1; hct 36.4; mcv 84.3; plt 224; glucose 94; bun 41; creat 1.38; k+ 3.8; na++ 140; ca 9.0; liver normal albumin 3.9  09-04-18: wbc 4.5; hgb 10.5; hc6 34.8; mcv 85.1 plt 188 glucose 93; bun 27; creat 1.09; k+ 3.6; na++ 139; ca 9.7   NO NEW LABS.    Review of Systems  Constitutional: Negative for malaise/fatigue.  Respiratory: Negative for cough and shortness of breath.   Cardiovascular: Negative for chest pain, palpitations and leg swelling.  Gastrointestinal: Negative for abdominal pain, constipation and heartburn.  Musculoskeletal: Negative for back pain, joint pain and myalgias.  Skin: Negative.   Neurological: Negative for  dizziness.  Psychiatric/Behavioral: The patient is not nervous/anxious.     Physical Exam Exam conducted with a chaperone present.  Constitutional:      General: She is not in acute distress.    Appearance: She is well-developed. She is not diaphoretic.  Eyes:     Comments: History of bilateral cataract removal with lens implant       Neck:     Thyroid: No thyromegaly.  Cardiovascular:     Rate and Rhythm: Normal rate and regular rhythm.     Heart sounds: Murmur present.     Comments: Post tibs present bilaterally Pedal pulses faint  Pulmonary:     Effort: Pulmonary effort is normal. No respiratory distress.     Breath sounds: Normal breath sounds.     Comments: 02 dependent  Abdominal:     General: Bowel sounds are normal. There is no distension.     Palpations: Abdomen is soft.     Tenderness: There is no abdominal tenderness.  Musculoskeletal:     Right lower leg: Edema present.     Left lower leg: Edema present.     Comments:  Is able to move all extremities Has 2+ bilateral lower extremity edema      Lymphadenopathy:     Cervical: No cervical adenopathy.  Skin:    General: Skin is warm and dry.     Comments: Bilateral lower extremities discolored     Neurological:     Mental Status: She is alert. Mental status is at baseline.  Psychiatric:        Mood and Affect: Mood normal.     ASSESSMENT/ PLAN:  TODAY:   1. Chronic constipation is stable will continue colace twice daily    2 depression with anxiety: is stable will continue celexa 20 mg daily and ativan 0.5 mg twice daily    3. Hyperlipidemia: is stable is off mevacor will not make changes. Will monitor   PREVIOUS   4. Increased intraocular pressure; bilateral: is stable will continue simbrinza to both eyes three times daily and travatan to both eyes nightly   5. Chronic diastolic CF (congestive heart failure) EF 65-70% (03-04-18); is stable will continue apresoline 50 mg twice daily has prn ntg for chest pain   6. Deep vein thrombosis (DVT) of left lower extremity unspecified chronicity unspecified vein: is stable will monitor  7. Chronic non-seasonal allergic rhinitis: is stable will continue claritin 10 mg  8. Hypokalemia: is stable will continue k+ 10 meq daily   9. 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 Chronic respiratory failure with hypoxia on home 02 therapy: is stable is 02 dependent will continue duoneb every 6 hours as needed claritin 10 mg daily   11. GERD without esophagitis: is stable will continue protonix 40 mg twice daily   12. Vascular dementia with behavioral disturbance is without change: weight is 171 (previous 178, 182) pounds albumin 3.9; will continue aricept 5 mg daily   13. Primary osteoarthritis multiple joints: is stable will continue tylenol 1 gm twice daily   14. CKD (chronic kidney disease) stage 3 GFR 30-59 ml/min stable bun 27; creat 1.09  On 03-04-19 will check cbc; cmp     MD is aware of resident's narcotic use and is in agreement with current plan of care. We will attempt to  wean resident as apropriate   Ok Edwards NP [facility]  Contact [phone] Monday through Friday 8am- 5pm  After hours call [phone]

## 2019-02-11 ENCOUNTER — Other Ambulatory Visit: Payer: Self-pay | Admitting: Adult Health

## 2019-02-11 MED ORDER — LORAZEPAM 0.5 MG PO TABS: 0.5000 mg | ORAL_TABLET | Freq: Two times a day (BID) | ORAL | 0 refills | Status: DC

## 2019-03-05 ENCOUNTER — Other Ambulatory Visit: Payer: Self-pay | Admitting: Adult Health

## 2019-03-05 MED ORDER — LORAZEPAM 0.5 MG PO TABS: 0.2500 mg | ORAL_TABLET | Freq: Two times a day (BID) | ORAL | 0 refills | Status: DC

## 2019-03-07 DIAGNOSIS — F320 Major depressive disorder, single episode, mild: Secondary | ICD-10-CM | POA: Diagnosis not present

## 2019-03-07 DIAGNOSIS — F411 Generalized anxiety disorder: Secondary | ICD-10-CM | POA: Diagnosis not present

## 2019-03-07 DIAGNOSIS — F428 Other obsessive-compulsive disorder: Secondary | ICD-10-CM | POA: Diagnosis not present

## 2019-03-07 DIAGNOSIS — F4310 Post-traumatic stress disorder, unspecified: Secondary | ICD-10-CM | POA: Diagnosis not present

## 2019-03-08 ENCOUNTER — Non-Acute Institutional Stay (SKILLED_NURSING_FACILITY): Payer: Medicare Other | Admitting: Adult Health

## 2019-03-08 ENCOUNTER — Encounter: Payer: Self-pay | Admitting: Adult Health

## 2019-03-08 DIAGNOSIS — I82402 Acute embolism and thrombosis of unspecified deep veins of left lower extremity: Secondary | ICD-10-CM | POA: Diagnosis not present

## 2019-03-08 DIAGNOSIS — I5032 Chronic diastolic (congestive) heart failure: Secondary | ICD-10-CM

## 2019-03-08 DIAGNOSIS — F411 Generalized anxiety disorder: Secondary | ICD-10-CM | POA: Diagnosis not present

## 2019-03-08 DIAGNOSIS — F0150 Vascular dementia without behavioral disturbance: Secondary | ICD-10-CM | POA: Diagnosis not present

## 2019-03-08 DIAGNOSIS — J3089 Other allergic rhinitis: Secondary | ICD-10-CM

## 2019-03-08 NOTE — Progress Notes (Signed)
Location:    [place] Room Number: 107 D Place of Service:  SNF (31)   CODE STATUS: DNR  Allergies  Allergen Reactions  . Bee Venom Anaphylaxis  . Ace Inhibitors   . Angiotensin Receptor Blockers     Tongue swelling.  . Aspirin     Directed by physician  . Cabbage Other (See Comments)    Certain foods due to gout flares  . Latex Rash    Chief Complaint  Patient presents with  . Medical Management of Chronic Issues       Chronic diastolic CHF(congestive heart failure)Deep vein thrombosis (DVT) of left lower extremity unspecified chronicity unspecified vein: Chronic non-seasonal allergic rhinitis:     HPI:  She is a 85 year old long term resident of this facility being seen for the management of her chronic illnesses: chf; dvt; allergic rhinitis. She denies any uncontrolled pain; no cough; no sinus congestion. She is slowly losing weight; this is an unfortunate outcome at the later stages of this disease process.   Past Medical History:  Diagnosis Date  . Anxiety   . Cellulitis 2016  . CHF (congestive heart failure) ([place])   . Chronic kidney disease   . Dementia ([place])   . Dysphagia   . Gait abnormality   . GERD (gastroesophageal reflux disease)   . Glaucoma   . Gout   . Gout, unspecified 01/12/2010   Qualifier: Diagnosis of  By: Moshe Cipro MD, Joycelyn Schmid    . Hyperlipidemia   . Hypertension   . Muscle weakness   . Myocardial infarction [facility] [place]  . Osteoarthritis   . Syncope     Past Surgical History:  Procedure Laterality Date  . APPENDECTOMY    . CATARACT EXTRACTION W/PHACO  08/04/2011   Procedure: CATARACT EXTRACTION PHACO AND INTRAOCULAR LENS PLACEMENT (IOC);  Surgeon: Tonny Branch;  Location: [facility];  Service: Ophthalmology;  Laterality: Right;  CDE=18.53  . CATARACT EXTRACTION W/PHACO  08/25/2011   Procedure: CATARACT EXTRACTION PHACO AND INTRAOCULAR LENS PLACEMENT (IOC);  Surgeon: Tonny Branch;  Location: [facility];   Service: Ophthalmology;  Laterality: Left;  CDE:14.76  . TONSILLECTOMY      Social History   Socioeconomic History  . Marital status: Divorced    Spouse name: Not on file  . Number of children: Not on file  . Years of education: Not on file  . Highest education level: Not on file  Occupational History  . Occupation: retired   Scientific laboratory technician  . Financial resource strain: Not hard at all  . Food insecurity    Worry: Never true    Inability: Never true  . Transportation needs    Medical: No    Non-medical: No  Tobacco Use  . Smoking status: Never Smoker  . Smokeless tobacco: Never Used  Substance and Sexual Activity  . Alcohol use: No  . Drug use: No  . Sexual activity: Not Currently  Lifestyle  . Physical activity    Days per week: 0 days    Minutes per session: 0 min  . Stress: Only a little  Relationships  . Social connections    Talks on phone: More than three times a week    Gets together: More than three times a week    Attends religious service: Never    Active member of club or organization: No    Attends meetings of clubs or organizations: Never    Relationship status: Divorced  .  Intimate partner violence    Fear of current or ex partner: No    Emotionally abused: No    Physically abused: No    Forced sexual activity: No  Other Topics Concern  . Not on file  Social History Narrative  . Not on file   Family History  Problem Relation Age of Onset  . Diabetes Father   . Diabetes Sister   . Diabetes Brother   . Stroke Sister   . Stroke Sister   . Anesthesia problems Neg Hx   . Hypotension Neg Hx   . Malignant hyperthermia Neg Hx   . Pseudochol deficiency Neg Hx       VITAL SIGNS Ht 4\' 11"  (1.499 m)   Wt 167 lb 12.8 oz (76.1 kg)   BMI 33.89 kg/m   Outpatient Encounter Medications as of 03/08/2019  Medication Sig  . acetaminophen (TYLENOL) 500 MG tablet Take 1,000 mg by mouth 2 (two) times daily. For leg pain and arthritis. May have additional  1000 mg for break through pain once a day prn  . Brinzolamide-Brimonidine (SIMBRINZA) 1-0.2 % SUSP One  drop into both eyes three times a day   . Calcium Carbonate-Vitamin D (CALCIUM-VITAMIN D) 500-200 MG-UNIT tablet Take 1 tablet by mouth 2 (two) times daily.  . citalopram (CELEXA) 20 MG tablet Take 20 mg by mouth daily.  . cycloSPORINE (RESTASIS) 0.05 % ophthalmic emulsion Place 1 drop into both eyes 2 (two) times daily.  Marland Kitchen docusate sodium (COLACE) 100 MG capsule Take 100 mg by mouth 2 (two) times daily.  Marland Kitchen donepezil (ARICEPT) 5 MG tablet Take 5 mg by mouth daily. For dementia  . hydrALAZINE (APRESOLINE) 50 MG tablet Take 50 mg by mouth 2 (two) times a day.   . hydrochlorothiazide (HYDRODIURIL) 12.5 MG tablet Take 12.5 mg by mouth daily.  Marland Kitchen ipratropium-albuterol (DUONEB) 0.5-2.5 (3) MG/3ML SOLN Take 3 mLs by nebulization every 6 (six) hours as needed.  . loratadine (CLARITIN) 10 MG tablet Take 10 mg by mouth daily.  Marland Kitchen LORazepam (ATIVAN) 0.5 MG tablet Take 0.5 mg by mouth daily.  Marland Kitchen LORazepam (ATIVAN) 0.5 MG tablet Take 1/2 tablet (0.25 mg) by mouth daily at 9:00 am  . nitroGLYCERIN (NITROSTAT) 0.4 MG SL tablet Place 0.4 mg under the tongue every 5 (five) minutes as needed for chest pain.   . NON FORMULARY Diet Type:  Regular, NAS  . OXYGEN Inhale 2 L/min into the lungs continuous. Wean to keep sats above 90%  . pantoprazole (PROTONIX) 40 MG tablet Take 40 mg by mouth 2 (two) times daily. Do not crush.   . potassium chloride (K-DUR,KLOR-CON) 10 MEQ tablet Take 10 mEq by mouth daily.  . Saline (OCEAN NASAL SPRAY NA) Place 1 spray into the nose every 2 (two) hours as needed.  . Travoprost, BAK Free, (TRAVATAN) 0.004 % SOLN ophthalmic solution Place 1 drop into both eyes at bedtime. For glaucoma  . [DISCONTINUED] LORazepam (ATIVAN) 0.5 MG tablet Take 0.5 tablets (0.25 mg total) by mouth 2 (two) times daily. (Patient not taking: Reported on 03/08/2019)   No facility-administered encounter  medications on file as of 03/08/2019.      SIGNIFICANT DIAGNOSTIC EXAMS  LABS REVIEWED PREVIOUS:   06-29-18: wbc 4.1; hgb 11.1; hct 36.4; mcv 84.3; plt 224; glucose 94; bun 41; creat 1.38; k+ 3.8; na++ 140; ca 9.0; liver normal albumin 3.9  09-04-18: wbc 4.5; hgb 10.5; hc6 34.8; mcv 85.1 plt 188 glucose 93; bun 27; creat 1.09; k+ 3.6;  na++ 139; ca 9.7   NO NEW LABS.    Review of Systems  Constitutional: Negative for malaise/fatigue.  Respiratory: Negative for cough and shortness of breath.   Cardiovascular: Negative for chest pain, palpitations and leg swelling.  Gastrointestinal: Negative for abdominal pain, constipation and heartburn.  Musculoskeletal: Negative for back pain, joint pain and myalgias.  Skin: Negative.   Neurological: Negative for dizziness.  Psychiatric/Behavioral: The patient is not nervous/anxious.      Physical Exam Constitutional:      General: She is not in acute distress.    Appearance: She is well-developed. She is not diaphoretic.  Eyes:     Comments: History of bilateral cataract removal with lens implant       Neck:     Musculoskeletal: Neck supple.     Thyroid: No thyromegaly.  Cardiovascular:     Rate and Rhythm: Normal rate and regular rhythm.     Heart sounds: Murmur present.     Comments: 2/6 Post tibs present bilaterally Pedal pulses faint  Pulmonary:     Effort: Pulmonary effort is normal. No respiratory distress.     Breath sounds: Normal breath sounds.     Comments: 02 dependent  Abdominal:     General: Bowel sounds are normal. There is no distension.     Palpations: Abdomen is soft.     Tenderness: There is no abdominal tenderness.  Musculoskeletal:     Right lower leg: Edema present.     Left lower leg: Edema present.     Comments: Is able to move all extremities Has 2+ bilateral lower extremity edema  Lymphadenopathy:     Cervical: No cervical adenopathy.  Skin:    General: Skin is warm and dry.     Comments: Bilateral  lower extremities discolored   Neurological:     Mental Status: She is alert. Mental status is at baseline.  Psychiatric:        Mood and Affect: Mood normal.      ASSESSMENT/ PLAN:  TODAY:   1. Chronic diastolic CHF(congestive heart failure) EF 65-70% (03-04-18); is stable will continue apresoline 50 mg twice daily and has prn ntg   2. Deep vein thrombosis (DVT) of left lower extremity unspecified chronicity unspecified vein: is stable will not make changes will monitor her status.   3. Chronic non-seasonal allergic rhinitis: is stable will continue claritin 10 mg daily   PREVIOUS   4. Increased intraocular pressure; bilateral: is stable will continue simbrinza to both eyes three times daily and travatan to both eyes nightly   5. Hypokalemia: is stable will continue k+ 10 meq daily   6. 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 Chronic respiratory failure with hypoxia on home 02 therapy: is stable is 02 dependent will continue duoneb every 6 hours as needed claritin 10 mg daily   8. GERD without esophagitis: is stable will continue protonix 40 mg twice daily   9. Vascular dementia with behavioral disturbance is without change: weight is 167 (previous 171, 178, 182) pounds albumin 3.9; will continue aricept 5 mg daily is slowly losing weight; this is an unfortunate outcome in the late stages of this disease process  10. Primary osteoarthritis multiple joints: is stable will continue tylenol 1 gm twice daily   11. CKD (chronic kidney disease) stage 3 GFR  30-59 ml/min stable bun 27; creat 1.09  12. Chronic constipation is stable will continue colace twice daily    13 depression with anxiety: is stable will continue celexa 20 mg daily and ativan 0.25 mg in the AM 0.5 mg in the PM       MD is aware of  resident's narcotic use and is in agreement with current plan of care. We will attempt to wean resident as apropriate   Ok Edwards NP [facility]  Contact [phone] Monday through Friday 8am- 5pm  After hours call [phone]

## 2019-03-11 ENCOUNTER — Encounter (HOSPITAL_COMMUNITY)
Admission: RE | Admit: 2019-03-11 | Discharge: 2019-03-11 | Disposition: A | Discharge status: Home / Self Care | Payer: Medicare Other | Source: Ambulatory Visit | Attending: Adult Health | Admitting: Adult Health

## 2019-03-11 DIAGNOSIS — I130 Hypertensive heart and chronic kidney disease with heart failure and stage 1 through stage 4 chronic kidney disease, or unspecified chronic kidney disease: Secondary | ICD-10-CM | POA: Insufficient documentation

## 2019-03-11 LAB — COMPREHENSIVE METABOLIC PANEL
ALT: 11 U/L (ref 0–44)
AST: 22 U/L (ref 15–41)
Albumin: 4 g/dL (ref 3.5–5.0)
Alkaline Phosphatase: 77 U/L (ref 38–126)
Anion gap: 10 (ref 5–15)
BUN: 44 mg/dL — ABNORMAL HIGH (ref 8–23)
CO2: 29 mmol/L (ref 22–32)
Calcium: 9.4 mg/dL (ref 8.9–10.3)
Chloride: 100 mmol/L (ref 98–111)
Creatinine, Ser: 1.32 mg/dL — ABNORMAL HIGH (ref 0.44–1.00)
GFR calc Af Amer: 40 mL/min — ABNORMAL LOW (ref >60)
GFR calc non Af Amer: 34 mL/min — ABNORMAL LOW (ref >60)
Glucose, Bld: 87 mg/dL (ref 70–99)
Potassium: 3.8 mmol/L (ref 3.5–5.1)
Sodium: 139 mmol/L (ref 135–145)
Total Bilirubin: 0.5 mg/dL (ref 0.3–1.2)
Total Protein: 7.6 g/dL (ref 6.5–8.1)

## 2019-03-11 LAB — CBC
HCT: 36.7 % (ref 36.0–46.0)
Hemoglobin: 11.4 g/dL — ABNORMAL LOW (ref 12.0–15.0)
MCH: 25.9 pg — ABNORMAL LOW (ref 26.0–34.0)
MCHC: 31.1 g/dL (ref 30.0–36.0)
MCV: 83.4 fL (ref 80.0–100.0)
Platelets: 211 10*3/uL (ref 150–400)
RBC: 4.4 MIL/uL (ref 3.87–5.11)
RDW: 18.5 % — ABNORMAL HIGH (ref 11.5–15.5)
WBC: 4.7 10*3/uL (ref 4.0–10.5)
nRBC: 0 % (ref 0.0–0.2)

## 2019-03-12 ENCOUNTER — Encounter: Payer: Self-pay | Admitting: Adult Health

## 2019-03-12 ENCOUNTER — Non-Acute Institutional Stay (SKILLED_NURSING_FACILITY): Payer: Medicare Other | Admitting: Adult Health

## 2019-03-12 DIAGNOSIS — I130 Hypertensive heart and chronic kidney disease with heart failure and stage 1 through stage 4 chronic kidney disease, or unspecified chronic kidney disease: Secondary | ICD-10-CM | POA: Diagnosis not present

## 2019-03-12 DIAGNOSIS — I5032 Chronic diastolic (congestive) heart failure: Secondary | ICD-10-CM | POA: Diagnosis not present

## 2019-03-12 DIAGNOSIS — N183 Chronic kidney disease, stage 3 unspecified: Secondary | ICD-10-CM

## 2019-03-12 NOTE — Progress Notes (Signed)
Location:   [place] Room Number: 42 D Place of Service:  SNF (31)   CODE STATUS: DNR  Allergies  Allergen Reactions  . Bee Venom Anaphylaxis  . Ace Inhibitors   . Angiotensin Receptor Blockers     Tongue swelling.  . Aspirin     Directed by physician  . Cabbage Other (See Comments)    Certain foods due to gout flares  . Latex Rash    Chief Complaint  Patient presents with  . Acute Visit    Blood Pressure    HPI:  She has had some elevated readings up to 170's over 80's. There are no reports of falls or vertigo. There are no reports of uncontrolled pain. She does take her medications. She is 85 years old. Her elevated readings or not consistently elevated.   Past Medical History:  Diagnosis Date  . Anxiety   . Cellulitis 2016  . CHF (congestive heart failure) ([place])   . Chronic kidney disease   . Dementia ([place])   . Dysphagia   . Gait abnormality   . GERD (gastroesophageal reflux disease)   . Glaucoma   . Gout   . Gout, unspecified 01/12/2010   Qualifier: Diagnosis of  By: Moshe Cipro MD, Joycelyn Schmid    . Hyperlipidemia   . Hypertension   . Muscle weakness   . Myocardial infarction [facility]) [place]  . Osteoarthritis   . Syncope     Past Surgical History:  Procedure Laterality Date  . APPENDECTOMY    . CATARACT EXTRACTION W/PHACO  08/04/2011   Procedure: CATARACT EXTRACTION PHACO AND INTRAOCULAR LENS PLACEMENT (IOC);  Surgeon: Tonny Branch;  Location: [facility] ORS;  Service: Ophthalmology;  Laterality: Right;  CDE=18.53  . CATARACT EXTRACTION W/PHACO  08/25/2011   Procedure: CATARACT EXTRACTION PHACO AND INTRAOCULAR LENS PLACEMENT (IOC);  Surgeon: Tonny Branch;  Location: [facility] ORS;  Service: Ophthalmology;  Laterality: Left;  CDE:14.76  . TONSILLECTOMY      Social History   Socioeconomic History  . Marital status: Divorced    Spouse name: Not on file  . Number of children: Not on file  . Years of education: Not on file  . Highest  education level: Not on file  Occupational History  . Occupation: retired   Scientific laboratory technician  . Financial resource strain: Not hard at all  . Food insecurity    Worry: Never true    Inability: Never true  . Transportation needs    Medical: No    Non-medical: No  Tobacco Use  . Smoking status: Never Smoker  . Smokeless tobacco: Never Used  Substance and Sexual Activity  . Alcohol use: No  . Drug use: No  . Sexual activity: Not Currently  Lifestyle  . Physical activity    Days per week: 0 days    Minutes per session: 0 min  . Stress: Only a little  Relationships  . Social connections    Talks on phone: More than three times a week    Gets together: More than three times a week    Attends religious service: Never    Active member of club or organization: No    Attends meetings of clubs or organizations: Never    Relationship status: Divorced  . Intimate partner violence    Fear of current or ex partner: No    Emotionally abused: No    Physically abused: No    Forced sexual activity: No  Other Topics  Concern  . Not on file  Social History Narrative  . Not on file   Family History  Problem Relation Age of Onset  . Diabetes Father   . Diabetes Sister   . Diabetes Brother   . Stroke Sister   . Stroke Sister   . Anesthesia problems Neg Hx   . Hypotension Neg Hx   . Malignant hyperthermia Neg Hx   . Pseudochol deficiency Neg Hx       VITAL SIGNS BP (!) 158/70   Pulse (!) 59   Temp 98 F (36.7 C)   Ht 4\' 11"  (1.499 m)   Wt 167 lb 12.8 oz (76.1 kg)   SpO2 97%   BMI 33.89 kg/m   Outpatient Encounter Medications as of 03/12/2019  Medication Sig  . acetaminophen (TYLENOL) 500 MG tablet Take 1,000 mg by mouth 2 (two) times daily. For leg pain and arthritis. May have additional 1000 mg for break through pain once a day prn  . Brinzolamide-Brimonidine (SIMBRINZA) 1-0.2 % SUSP One  drop into both eyes three times a day   . Calcium Carbonate-Vitamin D (CALCIUM-VITAMIN  D) 500-200 MG-UNIT tablet Take 1 tablet by mouth 2 (two) times daily.  . citalopram (CELEXA) 20 MG tablet Take 20 mg by mouth daily.  . cycloSPORINE (RESTASIS) 0.05 % ophthalmic emulsion Place 1 drop into both eyes 2 (two) times daily.  Marland Kitchen docusate sodium (COLACE) 100 MG capsule Take 100 mg by mouth 2 (two) times daily.  Marland Kitchen donepezil (ARICEPT) 5 MG tablet Take 5 mg by mouth daily. For dementia  . hydrALAZINE (APRESOLINE) 50 MG tablet Take 50 mg by mouth 2 (two) times a day.   . hydrochlorothiazide (HYDRODIURIL) 12.5 MG tablet Take 12.5 mg by mouth daily.  Marland Kitchen ipratropium-albuterol (DUONEB) 0.5-2.5 (3) MG/3ML SOLN Take 3 mLs by nebulization every 6 (six) hours as needed.  . loratadine (CLARITIN) 10 MG tablet Take 10 mg by mouth daily.  Marland Kitchen LORazepam (ATIVAN) 0.5 MG tablet Take 0.25 mg by mouth daily. Give @ 9:00 am  . LORazepam (ATIVAN) 0.5 MG tablet Take 0.5 mg by mouth daily.   . nitroGLYCERIN (NITROSTAT) 0.4 MG SL tablet Place 0.4 mg under the tongue every 5 (five) minutes as needed for chest pain.   . NON FORMULARY Diet Type:  Regular, NAS  . OXYGEN Inhale 2 L/min into the lungs continuous. Wean to keep sats above 90%  . pantoprazole (PROTONIX) 40 MG tablet Take 40 mg by mouth 2 (two) times daily. Do not crush.   . potassium chloride (K-DUR,KLOR-CON) 10 MEQ tablet Take 10 mEq by mouth daily.  . Saline (OCEAN NASAL SPRAY NA) Place 1 spray into the nose every 2 (two) hours as needed.  . Travoprost, BAK Free, (TRAVATAN) 0.004 % SOLN ophthalmic solution Place 1 drop into both eyes at bedtime. For glaucoma   No facility-administered encounter medications on file as of 03/12/2019.      SIGNIFICANT DIAGNOSTIC EXAMS   LABS REVIEWED PREVIOUS:   06-29-18: wbc 4.1; hgb 11.1; hct 36.4; mcv 84.3; plt 224; glucose 94; bun 41; creat 1.38; k+ 3.8; na++ 140; ca 9.0; liver normal albumin 3.9  09-04-18: wbc 4.5; hgb 10.5; hc6 34.8; mcv 85.1 plt 188 glucose 93; bun 27; creat 1.09; k+ 3.6; na++ 139; ca 9.7    TODAY;   03-11-19: wbc 4.7; hgb 11.4; hct 36.7; mcv 83.4; plt 83.4; plt 211; glucose 87; bun 44; creat 1.32; k+ 3.8; na++ 139; ca 9.4; liver normal albumin 4.0  Review of Systems  Constitutional: Negative for malaise/fatigue.  Respiratory: Negative for cough and shortness of breath.   Cardiovascular: Negative for chest pain, palpitations and leg swelling.  Gastrointestinal: Negative for abdominal pain, constipation and heartburn.  Musculoskeletal: Negative for back pain, joint pain and myalgias.  Skin: Negative.   Neurological: Negative for dizziness.  Psychiatric/Behavioral: The patient is not nervous/anxious.     Physical Exam Constitutional:      General: She is not in acute distress.    Appearance: She is well-developed. She is not diaphoretic.  Eyes:     Comments: History of bilateral cataract removal with lens implant      Neck:     Musculoskeletal: Neck supple.     Thyroid: No thyromegaly.  Cardiovascular:     Rate and Rhythm: Normal rate and regular rhythm.     Pulses: Normal pulses.     Heart sounds: Murmur present.     Comments: Post tibs present bilaterally Pedal pulses faint  Pulmonary:     Effort: Pulmonary effort is normal. No respiratory distress.     Breath sounds: Normal breath sounds.     Comments: 02 dependent  Abdominal:     General: Bowel sounds are normal. There is no distension.     Palpations: Abdomen is soft.     Tenderness: There is no abdominal tenderness.  Musculoskeletal:     Right lower leg: Edema present.     Left lower leg: Edema present.     Comments: Is able to move all extremities Has 2+ bilateral lower extremity edema   Lymphadenopathy:     Cervical: No cervical adenopathy.  Skin:    General: Skin is warm and dry.  Neurological:     Mental Status: She is alert. Mental status is at baseline.  Psychiatric:        Mood and Affect: Mood normal.       ASSESSMENT/ PLAN:  TODAY ;  1. Hypertensive heart and kidney disease  with chronic diastolic congestive heart failure and stage 2 chronic kidney disease: is without change: will continue hctz 12.5 mg daily apresoline 50 mg twice daily will continue to monitor her status.       MD is aware of resident's narcotic use and is in agreement with current plan of care. We will attempt to wean resident as apropriate   Ok Edwards NP [facility]  Contact [phone] Monday through Friday 8am- 5pm  After hours call [phone]

## 2019-03-13 ENCOUNTER — Encounter: Payer: Self-pay | Admitting: Adult Health

## 2019-03-13 ENCOUNTER — Non-Acute Institutional Stay (SKILLED_NURSING_FACILITY): Payer: Medicare Other | Admitting: Adult Health

## 2019-03-13 DIAGNOSIS — Z9981 Dependence on supplemental oxygen: Secondary | ICD-10-CM

## 2019-03-13 DIAGNOSIS — N183 Chronic kidney disease, stage 3 unspecified: Secondary | ICD-10-CM

## 2019-03-13 DIAGNOSIS — J9611 Chronic respiratory failure with hypoxia: Secondary | ICD-10-CM | POA: Diagnosis not present

## 2019-03-13 DIAGNOSIS — F01518 Vascular dementia, unspecified severity, with other behavioral disturbance: Secondary | ICD-10-CM

## 2019-03-13 DIAGNOSIS — F0151 Vascular dementia with behavioral disturbance: Secondary | ICD-10-CM

## 2019-03-13 NOTE — Progress Notes (Signed)
Location:   [place] Room Number: 59 D Place of Service:  SNF (31)   CODE STATUS: DNR  Allergies  Allergen Reactions  . Bee Venom Anaphylaxis  . Ace Inhibitors   . Angiotensin Receptor Blockers     Tongue swelling.  . Aspirin     Directed by physician  . Cabbage Other (See Comments)    Certain foods due to gout flares  . Latex Rash    Chief Complaint  Patient presents with  . Acute Visit    Care Plan Meeting    HPI:  We have come together for her care plan meeting. She does have family present via phone BIMS 13/15 mood 0/30 She continues to be mostly independent with her adls. She has lost up to 12 pounds; she states that she is trying to lose weight. There are no reports of recent falls. She denies any uncontrolled pain; no changes in her appetite; no anxiety or depressive thoughts  Past Medical History:  Diagnosis Date  . Anxiety   . Cellulitis 2016  . CHF (congestive heart failure) ([place])   . Chronic kidney disease   . Dementia ([place])   . Dysphagia   . Gait abnormality   . GERD (gastroesophageal reflux disease)   . Glaucoma   . Gout   . Gout, unspecified 01/12/2010   Qualifier: Diagnosis of  By: Moshe Cipro MD, Joycelyn Schmid    . Hyperlipidemia   . Hypertension   . Muscle weakness   . Myocardial infarction [facility]) [place]  . Osteoarthritis   . Syncope     Past Surgical History:  Procedure Laterality Date  . APPENDECTOMY    . CATARACT EXTRACTION W/PHACO  08/04/2011   Procedure: CATARACT EXTRACTION PHACO AND INTRAOCULAR LENS PLACEMENT (IOC);  Surgeon: Tonny Branch;  Location: [facility];  Service: Ophthalmology;  Laterality: Right;  CDE=18.53  . CATARACT EXTRACTION W/PHACO  08/25/2011   Procedure: CATARACT EXTRACTION PHACO AND INTRAOCULAR LENS PLACEMENT (IOC);  Surgeon: Tonny Branch;  Location: [facility];  Service: Ophthalmology;  Laterality: Left;  CDE:14.76  . TONSILLECTOMY      Social History   Socioeconomic History  . Marital  status: Divorced    Spouse name: Not on file  . Number of children: Not on file  . Years of education: Not on file  . Highest education level: Not on file  Occupational History  . Occupation: retired   Scientific laboratory technician  . Financial resource strain: Not hard at all  . Food insecurity    Worry: Never true    Inability: Never true  . Transportation needs    Medical: No    Non-medical: No  Tobacco Use  . Smoking status: Never Smoker  . Smokeless tobacco: Never Used  Substance and Sexual Activity  . Alcohol use: No  . Drug use: No  . Sexual activity: Not Currently  Lifestyle  . Physical activity    Days per week: 0 days    Minutes per session: 0 min  . Stress: Only a little  Relationships  . Social connections    Talks on phone: More than three times a week    Gets together: More than three times a week    Attends religious service: Never    Active member of club or organization: No    Attends meetings of clubs or organizations: Never    Relationship status: Divorced  . Intimate partner violence    Fear of current or ex  partner: No    Emotionally abused: No    Physically abused: No    Forced sexual activity: No  Other Topics Concern  . Not on file  Social History Narrative  . Not on file   Family History  Problem Relation Age of Onset  . Diabetes Father   . Diabetes Sister   . Diabetes Brother   . Stroke Sister   . Stroke Sister   . Anesthesia problems Neg Hx   . Hypotension Neg Hx   . Malignant hyperthermia Neg Hx   . Pseudochol deficiency Neg Hx       VITAL SIGNS BP (!) 158/70   Pulse (!) 59   Temp 99 F (37.2 C)   Ht 4\' 11"  (1.499 m)   Wt 167 lb 12.8 oz (76.1 kg)   SpO2 97%   BMI 33.89 kg/m   Outpatient Encounter Medications as of 03/13/2019  Medication Sig  . acetaminophen (TYLENOL) 500 MG tablet Take 1,000 mg by mouth 2 (two) times daily. For leg pain and arthritis. May have additional 1000 mg for break through pain once a day prn  .  Brinzolamide-Brimonidine (SIMBRINZA) 1-0.2 % SUSP One  drop into both eyes three times a day   . Calcium Carbonate-Vitamin D (CALCIUM-VITAMIN D) 500-200 MG-UNIT tablet Take 1 tablet by mouth 2 (two) times daily.  . citalopram (CELEXA) 20 MG tablet Take 20 mg by mouth daily.  . cycloSPORINE (RESTASIS) 0.05 % ophthalmic emulsion Place 1 drop into both eyes 2 (two) times daily.  Marland Kitchen docusate sodium (COLACE) 100 MG capsule Take 100 mg by mouth 2 (two) times daily.  Marland Kitchen donepezil (ARICEPT) 5 MG tablet Take 5 mg by mouth daily. For dementia  . hydrALAZINE (APRESOLINE) 50 MG tablet Take 50 mg by mouth 2 (two) times a day.   . hydrochlorothiazide (HYDRODIURIL) 12.5 MG tablet Take 12.5 mg by mouth daily.  Marland Kitchen ipratropium-albuterol (DUONEB) 0.5-2.5 (3) MG/3ML SOLN Take 3 mLs by nebulization every 6 (six) hours as needed.  . loratadine (CLARITIN) 10 MG tablet Take 10 mg by mouth daily.  Marland Kitchen LORazepam (ATIVAN) 0.5 MG tablet Take 0.25 mg by mouth daily. Give @ 9:00 am  . LORazepam (ATIVAN) 0.5 MG tablet Take 0.5 mg by mouth daily.   . nitroGLYCERIN (NITROSTAT) 0.4 MG SL tablet Place 0.4 mg under the tongue every 5 (five) minutes as needed for chest pain.   . NON FORMULARY Diet Type:  Regular, NAS  . OXYGEN Inhale 2 L/min into the lungs continuous. Wean to keep sats above 90%  . pantoprazole (PROTONIX) 40 MG tablet Take 40 mg by mouth 2 (two) times daily. Do not crush.   . potassium chloride (K-DUR,KLOR-CON) 10 MEQ tablet Take 10 mEq by mouth daily.  . Saline (OCEAN NASAL SPRAY NA) Place 1 spray into the nose every 2 (two) hours as needed.  . Travoprost, BAK Free, (TRAVATAN) 0.004 % SOLN ophthalmic solution Place 1 drop into both eyes at bedtime. For glaucoma   No facility-administered encounter medications on file as of 03/13/2019.      SIGNIFICANT DIAGNOSTIC EXAMS   LABS REVIEWED PREVIOUS:   06-29-18: wbc 4.1; hgb 11.1; hct 36.4; mcv 84.3; plt 224; glucose 94; bun 41; creat 1.38; k+ 3.8; na++ 140; ca 9.0;  liver normal albumin 3.9  09-04-18: wbc 4.5; hgb 10.5; hc6 34.8; mcv 85.1 plt 188 glucose 93; bun 27; creat 1.09; k+ 3.6; na++ 139; ca 9.7  03-11-19: wbc 4.7; hgb 11.4; hct 36.7; mcv 83.4; plt  83.4; plt 211; glucose 87; bun 44; creat 1.32; k+ 3.8; na++ 139; ca 9.4; liver normal albumin 4.0   NO NEW LABS.     Review of Systems  Constitutional: Negative for malaise/fatigue.  Respiratory: Negative for cough and shortness of breath.   Cardiovascular: Negative for chest pain, palpitations and leg swelling.  Gastrointestinal: Negative for abdominal pain, constipation and heartburn.  Musculoskeletal: Negative for back pain, joint pain and myalgias.  Skin: Negative.   Neurological: Negative for dizziness.  Psychiatric/Behavioral: The patient is not nervous/anxious.      Physical Exam Constitutional:      General: She is not in acute distress.    Appearance: She is well-developed. She is not diaphoretic.  Eyes:     Comments: History of bilateral cataract removal with lens implant     Neck:     Musculoskeletal: Neck supple.     Thyroid: No thyromegaly.  Cardiovascular:     Rate and Rhythm: Normal rate and regular rhythm.     Pulses: Normal pulses.     Heart sounds: Normal heart sounds.     Comments: Post tibs present bilaterally Pedal pulses faint  Pulmonary:     Effort: Pulmonary effort is normal. No respiratory distress.     Breath sounds: Normal breath sounds.     Comments: 02 dependent  Abdominal:     General: Bowel sounds are normal. There is no distension.     Palpations: Abdomen is soft.     Tenderness: There is no abdominal tenderness.  Musculoskeletal:     Right lower leg: Edema present.     Left lower leg: Edema present.     Comments: Is able to move all extremities Has 2+ bilateral lower extremity edema  Lymphadenopathy:     Cervical: No cervical adenopathy.  Skin:    General: Skin is warm and dry.  Neurological:     Mental Status: She is alert. Mental status is at  baseline.  Psychiatric:        Mood and Affect: Mood normal.     ASSESSMENT/ PLAN:  TODAY:   1. Vascular dementia with behavioral disturbance 2. CKD (chronic kidney disease) stage 3 3. Chronic respiratory failure with hypoxia  Will continue current medications Will continue current plan of care Will continue to monitor her status.       MD is aware of resident's narcotic use and is in agreement with current plan of care. We will attempt to wean resident as apropriate   Ok Edwards NP [facility]  Contact [phone] Monday through Friday 8am- 5pm  After hours call [phone]

## 2019-03-21 DIAGNOSIS — F411 Generalized anxiety disorder: Secondary | ICD-10-CM | POA: Diagnosis not present

## 2019-03-21 DIAGNOSIS — F4310 Post-traumatic stress disorder, unspecified: Secondary | ICD-10-CM | POA: Diagnosis not present

## 2019-03-21 DIAGNOSIS — F320 Major depressive disorder, single episode, mild: Secondary | ICD-10-CM | POA: Diagnosis not present

## 2019-03-21 DIAGNOSIS — F428 Other obsessive-compulsive disorder: Secondary | ICD-10-CM | POA: Diagnosis not present

## 2019-03-28 DIAGNOSIS — F428 Other obsessive-compulsive disorder: Secondary | ICD-10-CM | POA: Diagnosis not present

## 2019-03-28 DIAGNOSIS — F411 Generalized anxiety disorder: Secondary | ICD-10-CM | POA: Diagnosis not present

## 2019-03-28 DIAGNOSIS — F320 Major depressive disorder, single episode, mild: Secondary | ICD-10-CM | POA: Diagnosis not present

## 2019-04-01 ENCOUNTER — Other Ambulatory Visit: Payer: Self-pay | Admitting: Adult Health

## 2019-04-01 MED ORDER — LORAZEPAM 0.5 MG PO TABS: 0.5000 mg | ORAL_TABLET | Freq: Every day | ORAL | 0 refills | Status: DC

## 2019-04-01 MED ORDER — LORAZEPAM 0.5 MG PO TABS: 0.2500 mg | ORAL_TABLET | Freq: Every day | ORAL | 0 refills | Status: DC

## 2019-04-02 DIAGNOSIS — Z20828 Contact with and (suspected) exposure to other viral communicable diseases: Secondary | ICD-10-CM | POA: Diagnosis not present

## 2019-04-04 DIAGNOSIS — F320 Major depressive disorder, single episode, mild: Secondary | ICD-10-CM | POA: Diagnosis not present

## 2019-04-04 DIAGNOSIS — F411 Generalized anxiety disorder: Secondary | ICD-10-CM | POA: Diagnosis not present

## 2019-04-04 DIAGNOSIS — F428 Other obsessive-compulsive disorder: Secondary | ICD-10-CM | POA: Diagnosis not present

## 2019-04-05 ENCOUNTER — Encounter: Payer: Self-pay | Admitting: Adult Health

## 2019-04-05 ENCOUNTER — Non-Acute Institutional Stay (SKILLED_NURSING_FACILITY): Payer: Medicare Other | Admitting: Adult Health

## 2019-04-05 DIAGNOSIS — I5032 Chronic diastolic (congestive) heart failure: Secondary | ICD-10-CM

## 2019-04-05 DIAGNOSIS — Z9981 Dependence on supplemental oxygen: Secondary | ICD-10-CM | POA: Diagnosis not present

## 2019-04-05 DIAGNOSIS — N183 Chronic kidney disease, stage 3 unspecified: Secondary | ICD-10-CM

## 2019-04-05 DIAGNOSIS — I130 Hypertensive heart and chronic kidney disease with heart failure and stage 1 through stage 4 chronic kidney disease, or unspecified chronic kidney disease: Secondary | ICD-10-CM | POA: Diagnosis not present

## 2019-04-05 DIAGNOSIS — J9611 Chronic respiratory failure with hypoxia: Secondary | ICD-10-CM | POA: Diagnosis not present

## 2019-04-05 DIAGNOSIS — K219 Gastro-esophageal reflux disease without esophagitis: Secondary | ICD-10-CM

## 2019-04-05 NOTE — Progress Notes (Signed)
Location:   [place] Room Number: 14 D Place of Service:  SNF (31)   CODE STATUS: DNR  Allergies  Allergen Reactions  . Bee Venom Anaphylaxis  . Ace Inhibitors   . Angiotensin Receptor Blockers     Tongue swelling.  . Aspirin     Directed by physician  . Cabbage Other (See Comments)    Certain foods due to gout flares  . Latex Rash    Chief Complaint  Patient presents with  . Medical Management of Chronic Issues        Hypertensive heart and kidney disease with chronic diastolic congestive heart failure and stage 3 chronic kidney disease: Chronic respiratory failure with hypoxia on home 02 therapy:   GERD without esophagitis:     HPI:  She is a 85 year old long term resident of this facility being seen for the management of her chronic illnesses: hypertensive heart disease; respiratory failure; gerd. She denies any cough shortness of breath. No complaints of uncontrolled; states she has a good appetite.   Past Medical History:  Diagnosis Date  . Anxiety   . Cellulitis 2016  . CHF (congestive heart failure) ([place])   . Chronic kidney disease   . Dementia ([place])   . Dysphagia   . Gait abnormality   . GERD (gastroesophageal reflux disease)   . Glaucoma   . Gout   . Gout, unspecified 01/12/2010   Qualifier: Diagnosis of  By: Moshe Cipro MD, Joycelyn Schmid    . Hyperlipidemia   . Hypertension   . Muscle weakness   . Myocardial infarction [facility]) [place]  . Osteoarthritis   . Syncope     Past Surgical History:  Procedure Laterality Date  . APPENDECTOMY    . CATARACT EXTRACTION W/PHACO  08/04/2011   Procedure: CATARACT EXTRACTION PHACO AND INTRAOCULAR LENS PLACEMENT (IOC);  Surgeon: Tonny Branch;  Location: [facility];  Service: Ophthalmology;  Laterality: Right;  CDE=18.53  . CATARACT EXTRACTION W/PHACO  08/25/2011   Procedure: CATARACT EXTRACTION PHACO AND INTRAOCULAR LENS PLACEMENT (IOC);  Surgeon: Tonny Branch;  Location: [facility];  Service:  Ophthalmology;  Laterality: Left;  CDE:14.76  . TONSILLECTOMY      Social History   Socioeconomic History  . Marital status: Divorced    Spouse name: Not on file  . Number of children: Not on file  . Years of education: Not on file  . Highest education level: Not on file  Occupational History  . Occupation: retired   Scientific laboratory technician  . Financial resource strain: Not hard at all  . Food insecurity    Worry: Never true    Inability: Never true  . Transportation needs    Medical: No    Non-medical: No  Tobacco Use  . Smoking status: Never Smoker  . Smokeless tobacco: Never Used  Substance and Sexual Activity  . Alcohol use: No  . Drug use: No  . Sexual activity: Not Currently  Lifestyle  . Physical activity    Days per week: 0 days    Minutes per session: 0 min  . Stress: Only a little  Relationships  . Social connections    Talks on phone: More than three times a week    Gets together: More than three times a week    Attends religious service: Never    Active member of club or organization: No    Attends meetings of clubs or organizations: Never    Relationship status:  Divorced  . Intimate partner violence    Fear of current or ex partner: No    Emotionally abused: No    Physically abused: No    Forced sexual activity: No  Other Topics Concern  . Not on file  Social History Narrative  . Not on file   Family History  Problem Relation Age of Onset  . Diabetes Father   . Diabetes Sister   . Diabetes Brother   . Stroke Sister   . Stroke Sister   . Anesthesia problems Neg Hx   . Hypotension Neg Hx   . Malignant hyperthermia Neg Hx   . Pseudochol deficiency Neg Hx       VITAL SIGNS BP (!) 145/74   Pulse 69   Temp 99.3 F (37.4 C)   Resp 20   Ht 4\' 11"  (1.499 m)   Wt 171 lb 6.4 oz (77.7 kg)   SpO2 98%   BMI 34.62 kg/m   Outpatient Encounter Medications as of 04/05/2019  Medication Sig  . acetaminophen (TYLENOL) 500 MG tablet Take 1,000 mg by mouth 2  (two) times daily. For leg pain and arthritis. May have additional 1000 mg for break through pain once a day prn  . Balsam [place]-Castor Oil (VENELEX) OINT Apply topically to sacrum and bilateral buttocks every shift and as needed  . Brinzolamide-Brimonidine (SIMBRINZA) 1-0.2 % SUSP One  drop into both eyes three times a day   . Calcium Carbonate-Vitamin D (CALCIUM-VITAMIN D) 500-200 MG-UNIT tablet Take 1 tablet by mouth 2 (two) times daily.  . citalopram (CELEXA) 20 MG tablet Take 20 mg by mouth daily.  . cycloSPORINE (RESTASIS) 0.05 % ophthalmic emulsion Place 1 drop into both eyes 2 (two) times daily.  Marland Kitchen docusate sodium (COLACE) 100 MG capsule Take 100 mg by mouth 2 (two) times daily.  Marland Kitchen donepezil (ARICEPT) 5 MG tablet Take 5 mg by mouth daily. For dementia  . hydrALAZINE (APRESOLINE) 50 MG tablet Take 50 mg by mouth 2 (two) times a day.   . hydrochlorothiazide (HYDRODIURIL) 12.5 MG tablet Take 12.5 mg by mouth daily.  Marland Kitchen ipratropium-albuterol (DUONEB) 0.5-2.5 (3) MG/3ML SOLN Take 3 mLs by nebulization every 6 (six) hours as needed.  . loratadine (CLARITIN) 10 MG tablet Take 10 mg by mouth daily.  Marland Kitchen LORazepam (ATIVAN) 0.5 MG tablet Take 0.5 tablets (0.25 mg total) by mouth daily. Give @ 9:00 am  . LORazepam (ATIVAN) 0.5 MG tablet Take 1 tablet (0.5 mg total) by mouth daily.  . nitroGLYCERIN (NITROSTAT) 0.4 MG SL tablet Place 0.4 mg under the tongue every 5 (five) minutes as needed for chest pain.   . NON FORMULARY Diet Type:  Regular, NAS  . OXYGEN Inhale 2 L/min into the lungs continuous. Wean to keep sats above 90%  . pantoprazole (PROTONIX) 40 MG tablet Take 40 mg by mouth 2 (two) times daily. Do not crush.   . potassium chloride (K-DUR,KLOR-CON) 10 MEQ tablet Take 10 mEq by mouth daily.  . Saline (OCEAN NASAL SPRAY NA) Place 1 spray into the nose every 2 (two) hours as needed.  . Travoprost, BAK Free, (TRAVATAN) 0.004 % SOLN ophthalmic solution Place 1 drop into both eyes at bedtime. For  glaucoma   No facility-administered encounter medications on file as of 04/05/2019.      SIGNIFICANT DIAGNOSTIC EXAMS  LABS REVIEWED PREVIOUS:   06-29-18: wbc 4.1; hgb 11.1; hct 36.4; mcv 84.3; plt 224; glucose 94; bun 41; creat 1.38; k+ 3.8; na++ 140; ca  9.0; liver normal albumin 3.9  09-04-18: wbc 4.5; hgb 10.5; hc6 34.8; mcv 85.1 plt 188 glucose 93; bun 27; creat 1.09; k+ 3.6; na++ 139; ca 9.7  03-11-19: wbc 4.7; hgb 11.4; hct 36.7; mcv 83.4; plt 83.4; plt 211; glucose 87; bun 44; creat 1.32; k+ 3.8; na++ 139; ca 9.4; liver normal albumin 4.0   NO NEW LABS.     Review of Systems  Constitutional: Negative for malaise/fatigue.  Respiratory: Negative for cough and shortness of breath.   Cardiovascular: Negative for chest pain, palpitations and leg swelling.  Gastrointestinal: Negative for abdominal pain, constipation and heartburn.  Musculoskeletal: Negative for back pain, joint pain and myalgias.  Skin: Negative.   Neurological: Negative for dizziness.  Psychiatric/Behavioral: The patient is not nervous/anxious.     Physical Exam Constitutional:      General: She is not in acute distress.    Appearance: She is well-developed. She is not diaphoretic.  Eyes:     Comments: History of bilateral cataract removal with lens implant     Neck:     Musculoskeletal: Neck supple.     Thyroid: No thyromegaly.  Cardiovascular:     Rate and Rhythm: Normal rate and regular rhythm.     Heart sounds: Normal heart sounds.     Comments: Post tibs present bilaterally Pedal pulses faint  Pulmonary:     Effort: Pulmonary effort is normal. No respiratory distress.     Breath sounds: Normal breath sounds.     Comments: 02 dependent  Abdominal:     General: Bowel sounds are normal. There is no distension.     Palpations: Abdomen is soft.     Tenderness: There is no abdominal tenderness.  Musculoskeletal:     Right lower leg: Edema present.     Left lower leg: Edema present.     Comments: Is  able to move all extremities Has 2+ bilateral lower extremity edema   Lymphadenopathy:     Cervical: No cervical adenopathy.  Skin:    General: Skin is warm and dry.  Neurological:     Mental Status: She is alert. Mental status is at baseline.  Psychiatric:        Mood and Affect: Mood normal.      ASSESSMENT/ PLAN:  TODAY:   1. Hypertensive heart and kidney disease with chronic diastolic congestive heart failure and stage 3 chronic kidney disease: is stable b/p 145/74 will continue hctz 12.5 mg daily apresoline 50 mg twice daily   2. Chronic respiratory failure with hypoxia on home 02 therapy: is stable is 02 dependent will continue duoneb every 6 hours as needed will continue claritin 10 mg daily   3. GERD without esophagitis: is stable will continue protonix 40 mg twice daily   PREVIOUS   4. Increased intraocular pressure; bilateral: is stable will continue simbrinza to both eyes three times daily and travatan to both eyes nightly   5. Hypokalemia: is stable will continue k+ 10 meq daily   6. Vascular dementia with behavioral disturbance is without change: weight is 171 (previous  176, 171, 178, 182) pounds albumin 3.9; will continue aricept 5 mg daily is slowly losing weight; this is an unfortunate outcome in the late stages of this disease process  7. Primary osteoarthritis multiple joints: is stable will continue tylenol 1 gm twice daily   8. CKD (chronic kidney disease) stage 3 GFR 30-59 ml/min stable bun 27; creat 1.09  9. Chronic constipation is stable will continue colace twice  daily    10 depression with anxiety: is stable will continue celexa 20 mg daily and ativan 0.25 mg in the AM 0.5 mg in the PM   11. Chronic diastolic CHF(congestive heart failure) EF 65-70% (03-04-18); is stable will continue apresoline 50 mg twice daily and has prn ntg   12. Deep vein thrombosis (DVT) of left lower extremity unspecified chronicity unspecified vein: is stable will not make  changes will monitor her status.   13. Chronic non-seasonal allergic rhinitis: is stable will continue claritin 10 mg daily   MD is aware of resident's narcotic use and is in agreement with current plan of care. We will attempt to wean resident as apropriate   Ok Edwards NP [facility]  Contact [phone] Monday through Friday 8am- 5pm  After hours call [phone]

## 2019-04-17 DIAGNOSIS — F411 Generalized anxiety disorder: Secondary | ICD-10-CM | POA: Diagnosis not present

## 2019-04-17 DIAGNOSIS — F320 Major depressive disorder, single episode, mild: Secondary | ICD-10-CM | POA: Diagnosis not present

## 2019-04-17 DIAGNOSIS — F428 Other obsessive-compulsive disorder: Secondary | ICD-10-CM | POA: Diagnosis not present

## 2019-04-25 ENCOUNTER — Other Ambulatory Visit: Payer: Self-pay | Admitting: Adult Health

## 2019-04-25 MED ORDER — LORAZEPAM 0.5 MG PO TABS: 0.2500 mg | ORAL_TABLET | Freq: Every day | ORAL | 0 refills | Status: DC

## 2019-04-25 MED ORDER — LORAZEPAM 0.5 MG PO TABS: 0.5000 mg | ORAL_TABLET | Freq: Every day | ORAL | 0 refills | Status: DC

## 2019-05-02 ENCOUNTER — Non-Acute Institutional Stay (SKILLED_NURSING_FACILITY): Payer: Medicare Other | Admitting: Adult Health

## 2019-05-02 ENCOUNTER — Other Ambulatory Visit: Payer: Self-pay | Admitting: Adult Health

## 2019-05-02 ENCOUNTER — Encounter: Payer: Self-pay | Admitting: Adult Health

## 2019-05-02 DIAGNOSIS — F0151 Vascular dementia with behavioral disturbance: Secondary | ICD-10-CM | POA: Diagnosis not present

## 2019-05-02 DIAGNOSIS — F418 Other specified anxiety disorders: Secondary | ICD-10-CM

## 2019-05-02 DIAGNOSIS — F01518 Vascular dementia, unspecified severity, with other behavioral disturbance: Secondary | ICD-10-CM

## 2019-05-02 MED ORDER — LORAZEPAM 0.5 MG PO TABS: 0.2500 mg | ORAL_TABLET | Freq: Two times a day (BID) | ORAL | 0 refills | Status: DC

## 2019-05-02 NOTE — Progress Notes (Signed)
Location: [place] Room Number: 122-D Place of Service:  SNF (31)   CODE STATUS: DNR  Allergies  Allergen Reactions  . Bee Venom Anaphylaxis  . Ace Inhibitors   . Angiotensin Receptor Blockers     Tongue swelling.  . Aspirin     Directed by physician  . Cabbage Other (See Comments)    Certain foods due to gout flares  . Latex Rash    Chief Complaint  Patient presents with  . Acute Visit    Psychoactive medication review    HPI:  We have come together to discuss her psychoactive medications. She is presently taking ativan 0.25 mg in the AM and 0.5 mg in the PM. She denies any anxious or depressive thoughts. She denies any changes in her appetite. Her weight is stable. There are no reports of insomnia.   Past Medical History:  Diagnosis Date  . Anxiety   . Cellulitis 2016  . CHF (congestive heart failure) ([place])   . Chronic kidney disease   . Dementia ([place])   . Dysphagia   . Gait abnormality   . GERD (gastroesophageal reflux disease)   . Glaucoma   . Gout   . Gout, unspecified 01/12/2010   Qualifier: Diagnosis of  By: Moshe Cipro MD, Joycelyn Schmid    . Hyperlipidemia   . Hypertension   . Muscle weakness   . Myocardial infarction [facility]) [place]  . Osteoarthritis   . Syncope     Past Surgical History:  Procedure Laterality Date  . APPENDECTOMY    . CATARACT EXTRACTION W/PHACO  08/04/2011   Procedure: CATARACT EXTRACTION PHACO AND INTRAOCULAR LENS PLACEMENT (IOC);  Surgeon: Tonny Branch;  Location: [facility];  Service: Ophthalmology;  Laterality: Right;  CDE=18.53  . CATARACT EXTRACTION W/PHACO  08/25/2011   Procedure: CATARACT EXTRACTION PHACO AND INTRAOCULAR LENS PLACEMENT (IOC);  Surgeon: Tonny Branch;  Location: [facility];  Service: Ophthalmology;  Laterality: Left;  CDE:14.76  . TONSILLECTOMY      Social History   Socioeconomic History  . Marital status: Divorced    Spouse name: Not on file  . Number of children: Not on file  . Years  of education: Not on file  . Highest education level: Not on file  Occupational History  . Occupation: retired   Scientific laboratory technician  . Financial resource strain: Not hard at all  . Food insecurity    Worry: Never true    Inability: Never true  . Transportation needs    Medical: No    Non-medical: No  Tobacco Use  . Smoking status: Never Smoker  . Smokeless tobacco: Never Used  Substance and Sexual Activity  . Alcohol use: No  . Drug use: No  . Sexual activity: Not Currently  Lifestyle  . Physical activity    Days per week: 0 days    Minutes per session: 0 min  . Stress: Only a little  Relationships  . Social connections    Talks on phone: More than three times a week    Gets together: More than three times a week    Attends religious service: Never    Active member of club or organization: No    Attends meetings of clubs or organizations: Never    Relationship status: Divorced  . Intimate partner violence    Fear of current or ex partner: No    Emotionally abused: No    Physically abused: No    Forced sexual  activity: No  Other Topics Concern  . Not on file  Social History Narrative  . Not on file   Family History  Problem Relation Age of Onset  . Diabetes Father   . Diabetes Sister   . Diabetes Brother   . Stroke Sister   . Stroke Sister   . Anesthesia problems Neg Hx   . Hypotension Neg Hx   . Malignant hyperthermia Neg Hx   . Pseudochol deficiency Neg Hx       VITAL SIGNS BP (!) 190/72   Pulse 61   Temp 99 F (37.2 C) (Oral)   Resp 18   Ht 4\' 11"  (1.499 m)   Wt 168 lb (76.2 kg)   SpO2 97%   BMI 33.93 kg/m   Outpatient Encounter Medications as of 05/02/2019  Medication Sig  . acetaminophen (TYLENOL) 500 MG tablet Take 1,000 mg by mouth 2 (two) times daily. For leg pain and arthritis. May have additional 1000 mg for break through pain once a day prn  . Balsam [place]-Castor Oil (VENELEX) OINT Apply topically to sacrum and bilateral buttocks every shift  and as needed  . Brinzolamide-Brimonidine (SIMBRINZA) 1-0.2 % SUSP One  drop into both eyes three times a day   . Calcium Carbonate-Vitamin D (CALCIUM-VITAMIN D) 500-200 MG-UNIT tablet Take 1 tablet by mouth 2 (two) times daily.  . citalopram (CELEXA) 20 MG tablet Take 20 mg by mouth daily.  . cycloSPORINE (RESTASIS) 0.05 % ophthalmic emulsion Place 1 drop into both eyes 2 (two) times daily.  Marland Kitchen docusate sodium (COLACE) 100 MG capsule Take 100 mg by mouth 2 (two) times daily.  Marland Kitchen donepezil (ARICEPT) 5 MG tablet Take 5 mg by mouth daily. For dementia  . hydrALAZINE (APRESOLINE) 50 MG tablet Take 50 mg by mouth 2 (two) times a day.   . hydrochlorothiazide (HYDRODIURIL) 12.5 MG tablet Take 12.5 mg by mouth daily.  Marland Kitchen ipratropium-albuterol (DUONEB) 0.5-2.5 (3) MG/3ML SOLN Take 3 mLs by nebulization every 6 (six) hours as needed.  . loratadine (CLARITIN) 10 MG tablet Take 10 mg by mouth daily.  Marland Kitchen LORazepam (ATIVAN) 0.5 MG tablet Take 0.5 tablets (0.25 mg total) by mouth 2 (two) times daily.  . nitroGLYCERIN (NITROSTAT) 0.4 MG SL tablet Place 0.4 mg under the tongue every 5 (five) minutes as needed for chest pain.   . NON FORMULARY Diet Type:  Regular, NAS  . OXYGEN Inhale 2 L/min into the lungs continuous. Wean to keep sats above 90%  . pantoprazole (PROTONIX) 40 MG tablet Take 40 mg by mouth 2 (two) times daily. Do not crush.   . potassium chloride (K-DUR,KLOR-CON) 10 MEQ tablet Take 10 mEq by mouth daily.  . Saline (OCEAN NASAL SPRAY NA) Place 1 spray into the nose every 2 (two) hours as needed.  . Travoprost, BAK Free, (TRAVATAN) 0.004 % SOLN ophthalmic solution Place 1 drop into both eyes at bedtime. For glaucoma   No facility-administered encounter medications on file as of 05/02/2019.      SIGNIFICANT DIAGNOSTIC EXAMS   LABS REVIEWED PREVIOUS:   06-29-18: wbc 4.1; hgb 11.1; hct 36.4; mcv 84.3; plt 224; glucose 94; bun 41; creat 1.38; k+ 3.8; na++ 140; ca 9.0; liver normal albumin 3.9   09-04-18: wbc 4.5; hgb 10.5; hc6 34.8; mcv 85.1 plt 188 glucose 93; bun 27; creat 1.09; k+ 3.6; na++ 139; ca 9.7  03-11-19: wbc 4.7; hgb 11.4; hct 36.7; mcv 83.4; plt 83.4; plt 211; glucose 87; bun 44; creat 1.32; k+  3.8; na++ 139; ca 9.4; liver normal albumin 4.0   NO NEW LABS.     Review of Systems  Constitutional: Negative for malaise/fatigue.  Respiratory: Negative for cough and shortness of breath.   Cardiovascular: Negative for chest pain, palpitations and leg swelling.  Gastrointestinal: Negative for abdominal pain, constipation and heartburn.  Musculoskeletal: Negative for back pain, joint pain and myalgias.  Skin: Negative.   Neurological: Negative for dizziness.  Psychiatric/Behavioral: The patient is not nervous/anxious.       Physical Exam Constitutional:      General: She is not in acute distress.    Appearance: She is well-developed. She is not diaphoretic.  Eyes:     Comments: History of bilateral cataract removal with lens implant      Neck:     Musculoskeletal: Neck supple.     Thyroid: No thyromegaly.  Cardiovascular:     Rate and Rhythm: Normal rate and regular rhythm.     Heart sounds: Normal heart sounds.     Comments: Post tibs present bilaterally Pedal pulses faint  Pulmonary:     Effort: Pulmonary effort is normal. No respiratory distress.     Breath sounds: Normal breath sounds.     Comments: 02 dependent  Abdominal:     General: Bowel sounds are normal. There is no distension.     Palpations: Abdomen is soft.     Tenderness: There is no abdominal tenderness.  Musculoskeletal:     Right lower leg: Edema present.     Left lower leg: Edema present.     Comments:  Is able to move all extremities Has 2+ bilateral lower extremity edema    Lymphadenopathy:     Cervical: No cervical adenopathy.  Skin:    General: Skin is warm and dry.  Neurological:     Mental Status: She is alert. Mental status is at baseline.  Psychiatric:        Mood and Affect:  Mood normal.     ASSESSMENT/ PLAN:  TODAY;   1. Depression with anxiety 2. Vascular dementia without behavioral disturbance  Will lower her ativan to 0.25 mg twice daily and will monitor her status.      MD is aware of resident's narcotic use and is in agreement with current plan of care. We will attempt to wean resident as appropriate.  Ok Edwards NP [facility]  Contact [phone] Monday through Friday 8am- 5pm  After hours call [phone]

## 2019-05-08 ENCOUNTER — Non-Acute Institutional Stay (SKILLED_NURSING_FACILITY): Payer: Medicare Other | Admitting: Internal Medicine

## 2019-05-08 ENCOUNTER — Encounter: Payer: Self-pay | Admitting: Internal Medicine

## 2019-05-08 DIAGNOSIS — I130 Hypertensive heart and chronic kidney disease with heart failure and stage 1 through stage 4 chronic kidney disease, or unspecified chronic kidney disease: Secondary | ICD-10-CM

## 2019-05-08 DIAGNOSIS — N183 Chronic kidney disease, stage 3 unspecified: Secondary | ICD-10-CM

## 2019-05-08 DIAGNOSIS — J3089 Other allergic rhinitis: Secondary | ICD-10-CM

## 2019-05-08 DIAGNOSIS — I5032 Chronic diastolic (congestive) heart failure: Secondary | ICD-10-CM

## 2019-05-08 NOTE — Progress Notes (Signed)
Location:  [facility] Room Number: 122-D Place of Service:  SNF (31)  Hennie Duos, MD  Patient Care Team: Hennie Duos, MD as PCP - General (Internal Medicine) Nyoka Cowden Phylis Bougie, NP as Nurse Practitioner ([place]) [facility], [place] ([place])  Extended Emergency Contact Information Primary Emergency Contact: [place], [place] [address] [name] of [place] Phone: [phone] Relation: Daughter Secondary Emergency Contact: [name] [place] of [place] Phone: [phone] Relation: Son    Allergies: Bee venom, Ace inhibitors, Angiotensin receptor blockers, Aspirin, Cabbage, and Latex  Chief Complaint  Patient presents with  . Medical Management of Chronic Issues    Routine [place] visit    HPI: Patient is a 85 y.o. female who is being seen for routine issues of chronic diastolic congestive heart failure, hypertension, and allergic rhinitis.  Past Medical History:  Diagnosis Date  . Anxiety   . Cellulitis 2016  . CHF (congestive heart failure) ([place])   . Chronic kidney disease   . Dementia ([place])   . Dysphagia   . Gait abnormality   . GERD (gastroesophageal reflux disease)   . Glaucoma   . Gout   . Gout, unspecified 01/12/2010   Qualifier: Diagnosis of  By: Moshe Cipro MD, Joycelyn Schmid    . Hyperlipidemia   . Hypertension   . Muscle weakness   . Myocardial infarction [facility]) [place]  . Osteoarthritis   . Syncope     Past Surgical History:  Procedure Laterality Date  . APPENDECTOMY    . CATARACT EXTRACTION W/PHACO  08/04/2011   Procedure: CATARACT EXTRACTION PHACO AND INTRAOCULAR LENS PLACEMENT (IOC);  Surgeon: Tonny Branch;  Location: [facility] ORS;  Service: Ophthalmology;  Laterality: Right;  CDE=18.53  . CATARACT EXTRACTION W/PHACO  08/25/2011   Procedure: CATARACT EXTRACTION PHACO AND INTRAOCULAR LENS PLACEMENT (IOC);  Surgeon: Tonny Branch;  Location:  [facility] ORS;  Service: Ophthalmology;  Laterality: Left;  CDE:14.76  . TONSILLECTOMY      Allergies as of 05/08/2019      Reactions   Bee Venom Anaphylaxis   Ace Inhibitors    Angiotensin Receptor Blockers    Tongue swelling.   Aspirin    Directed by physician   Cabbage Other (See Comments)   Certain foods due to gout flares   Latex Rash      Medication List    Notice   This visit is during an admission. Changes to the med list made in this visit will be reflected in the After Visit Summary of the admission.     No orders of the defined types were placed in this encounter.   Immunization History  Administered Date(s) Administered  . Influenza Whole 06/11/2007, 10/16/2008, 05/21/2009, 05/05/2010  . Influenza-Unspecified 06/05/2014, 06/02/2016, 06/02/2017, 05/31/2018  . Pneumococcal Conjugate-13 06/06/2016  . Pneumococcal Polysaccharide-23 06/11/2007, 06/01/2016, 03/04/2018  . Td 06/11/2007  . Tdap 06/24/2017  . Zoster 05/21/2007    Social History   Tobacco Use  . Smoking status: Never Smoker  . Smokeless tobacco: Never Used  Substance Use Topics  . Alcohol use: No    Review of Systems  DATA OBTAINED: from patient GENERAL:  no fevers, fatigue, appetite changes SKIN: No itching, rash HEENT: No complaint RESPIRATORY: No cough, wheezing, SOB CARDIAC: No chest pain, palpitations, lower extremity edema  GI: No abdominal pain, No N/V/D or constipation, No heartburn or reflux  GU: No dysuria, frequency  or urgency, or incontinence  MUSCULOSKELETAL: No unrelieved bone/joint pain NEUROLOGIC: No headache, dizziness  PSYCHIATRIC: No overt anxiety or sadness  Vitals:   05/08/19 1555  BP: (!) 190/72  Pulse: 66  Resp: 18  Temp: 98.7 F (37.1 C)  SpO2: 99%   Body mass index is 33.93 kg/m. Physical Exam  GENERAL APPEARANCE: Alert, conversant, No acute distress  SKIN: No diaphoresis rash HEENT: Unremarkable RESPIRATORY: Breathing is even, unlabored. Lung sounds are  clear   CARDIOVASCULAR: Heart RRR 3/6 systolic murmur, no, rubs or gallops.  Trace peripheral edema; bilateral elephantitis GASTROINTESTINAL: Abdomen is soft, non-tender, not distended w/ normal bowel sounds.  GENITOURINARY: Bladder non tender, not distended  MUSCULOSKELETAL: No abnormal joints or musculature NEUROLOGIC: Cranial nerves 2-12 grossly intact. Moves all extremities PSYCHIATRIC: Mood and affect appropriate to situation, no behavioral issues  Patient Active Problem List   Diagnosis Date Noted  . Hypertensive heart and kidney disease with chronic diastolic congestive heart failure and stage 3 chronic kidney disease ([place]) 10/20/2018  . Chronic respiratory failure with hypoxia, on home O2 therapy ([place]) 10/20/2018  . Chronic constipation 10/20/2018  . Increased intraocular pressure, bilateral 10/20/2018  . Acute respiratory failure ([place]) 03/03/2018  . Acute bronchitis 03/03/2018  . CKD (chronic kidney disease) stage 3, GFR 30-59 ml/min (HCC) 03/03/2018  . Chronic diastolic CHF (congestive heart failure) ([place]) 03/03/2018  . Hyperglycemia 12/31/2015  . DVT (deep venous thrombosis) ([place]) 05/22/2015  . GERD 05/04/2010  . Depression with anxiety 07/05/2009  . Chronic non-seasonal allergic rhinitis 12/21/2008  . Hyperlipidemia 11/07/2007  . Dementia with behavioral disturbance ([place]) 11/07/2007  . Essential hypertension 11/07/2007  . Osteoarthritis 11/07/2007    CMP     Component Value Date/Time   NA 139 03/11/2019 0425   K 3.8 03/11/2019 0425   CL 100 03/11/2019 0425   CO2 29 03/11/2019 0425   GLUCOSE 87 03/11/2019 0425   BUN 44 (H) 03/11/2019 0425   CREATININE 1.32 (H) 03/11/2019 0425   CREATININE 1.12 (H) 05/01/2014 1028   CALCIUM 9.4 03/11/2019 0425   PROT 7.6 03/11/2019 0425   ALBUMIN 4.0 03/11/2019 0425   AST 22 03/11/2019 0425   ALT 11 03/11/2019 0425   ALKPHOS 77 03/11/2019 0425   BILITOT 0.5 03/11/2019 0425   GFRNONAA 34 (L) 03/11/2019 0425   GFRAA 40 (L)  03/11/2019 0425   Recent Labs    06/29/18 0700 09/04/18 0700 03/11/19 0425  NA 140 139 139  K 3.8 3.6 3.8  CL 104 99 100  CO2 28 31 29   GLUCOSE 94 93 87  BUN 41* 27* 44*  CREATININE 1.38* 1.09* 1.32*  CALCIUM 9.3 9.7 9.4   Recent Labs    06/29/18 0700 03/11/19 0425  AST 17 22  ALT 12 11  ALKPHOS 78 77  BILITOT 0.7 0.5  PROT 7.6 7.6  ALBUMIN 3.9 4.0   Recent Labs    06/29/18 0700 09/04/18 0700 03/11/19 0425  WBC 4.1 4.5 4.7  NEUTROABS  --  2.9  --   HGB 11.1* 10.5* 11.4*  HCT 36.4 34.8* 36.7  MCV 84.3 85.1 83.4  PLT 224 188 211   No results for input(s): CHOL, LDLCALC, TRIG in the last 8760 hours.  Invalid input(s): HCL No results found for: [facility] Lab Results  Component Value Date   TSH 0.369 03/05/2018   Lab Results  Component Value Date   HGBA1C 5.5 03/20/2017   Lab Results  Component Value Date   CHOL 234 (H) 09/22/2017  HDL 122 09/22/2017   LDLCALC 99 09/22/2017   TRIG 65 09/22/2017   CHOLHDL 1.9 09/22/2017    Significant Diagnostic Results in last 30 days:  No results found.  Assessment and Plan  Chronic diastolic CHF (congestive heart failure) (HCC) No reports of problems; patient is on hydrochlorothiazide, continue hydrochlorothiazide 12.5 mg  Hypertensive heart and kidney disease with chronic diastolic congestive heart failure and stage 3 chronic kidney disease ([place]) Controlled; continue hydrochlorothiazide 12.5 mg daily and hydralazine 50 mg twice daily  Chronic non-seasonal allergic rhinitis Appears controlled; continue Claritin 10 mg daily    Hennie Duos, MD

## 2019-05-10 ENCOUNTER — Encounter: Payer: Self-pay | Admitting: Internal Medicine

## 2019-05-11 ENCOUNTER — Encounter: Payer: Self-pay | Admitting: Internal Medicine

## 2019-05-11 NOTE — Assessment & Plan Note (Signed)
Appears controlled; continue Claritin 10 mg daily 

## 2019-05-11 NOTE — Assessment & Plan Note (Signed)
Controlled; continue hydrochlorothiazide 12.5 mg daily and hydralazine 50 mg twice daily

## 2019-05-11 NOTE — Assessment & Plan Note (Signed)
No reports of problems; patient is on hydrochlorothiazide, continue hydrochlorothiazide 12.5 mg

## 2019-05-16 DIAGNOSIS — F411 Generalized anxiety disorder: Secondary | ICD-10-CM | POA: Diagnosis not present

## 2019-05-16 DIAGNOSIS — F428 Other obsessive-compulsive disorder: Secondary | ICD-10-CM | POA: Diagnosis not present

## 2019-05-16 DIAGNOSIS — F320 Major depressive disorder, single episode, mild: Secondary | ICD-10-CM | POA: Diagnosis not present

## 2019-05-17 DIAGNOSIS — F0280 Dementia in other diseases classified elsewhere without behavioral disturbance: Secondary | ICD-10-CM | POA: Diagnosis not present

## 2019-05-17 DIAGNOSIS — F411 Generalized anxiety disorder: Secondary | ICD-10-CM | POA: Diagnosis not present

## 2019-05-30 DIAGNOSIS — F411 Generalized anxiety disorder: Secondary | ICD-10-CM | POA: Diagnosis not present

## 2019-05-30 DIAGNOSIS — F428 Other obsessive-compulsive disorder: Secondary | ICD-10-CM | POA: Diagnosis not present

## 2019-05-30 DIAGNOSIS — F320 Major depressive disorder, single episode, mild: Secondary | ICD-10-CM | POA: Diagnosis not present

## 2019-06-04 ENCOUNTER — Encounter: Payer: Self-pay | Admitting: Adult Health

## 2019-06-04 ENCOUNTER — Non-Acute Institutional Stay (SKILLED_NURSING_FACILITY): Payer: Medicare Other | Admitting: Adult Health

## 2019-06-04 DIAGNOSIS — I5032 Chronic diastolic (congestive) heart failure: Secondary | ICD-10-CM

## 2019-06-04 DIAGNOSIS — H40053 Ocular hypertension, bilateral: Secondary | ICD-10-CM

## 2019-06-04 DIAGNOSIS — J9611 Chronic respiratory failure with hypoxia: Secondary | ICD-10-CM | POA: Diagnosis not present

## 2019-06-04 DIAGNOSIS — I130 Hypertensive heart and chronic kidney disease with heart failure and stage 1 through stage 4 chronic kidney disease, or unspecified chronic kidney disease: Secondary | ICD-10-CM | POA: Diagnosis not present

## 2019-06-04 DIAGNOSIS — I82402 Acute embolism and thrombosis of unspecified deep veins of left lower extremity: Secondary | ICD-10-CM | POA: Diagnosis not present

## 2019-06-04 DIAGNOSIS — Z9981 Dependence on supplemental oxygen: Secondary | ICD-10-CM

## 2019-06-04 DIAGNOSIS — F418 Other specified anxiety disorders: Secondary | ICD-10-CM

## 2019-06-04 DIAGNOSIS — J3089 Other allergic rhinitis: Secondary | ICD-10-CM

## 2019-06-04 DIAGNOSIS — N1832 Chronic kidney disease, stage 3b: Secondary | ICD-10-CM | POA: Diagnosis not present

## 2019-06-04 DIAGNOSIS — F01518 Vascular dementia, unspecified severity, with other behavioral disturbance: Secondary | ICD-10-CM

## 2019-06-04 DIAGNOSIS — F0151 Vascular dementia with behavioral disturbance: Secondary | ICD-10-CM | POA: Diagnosis not present

## 2019-06-04 NOTE — Progress Notes (Signed)
Location:    [facility] Room Number: 122/D Place of Service:  SNF (31)   CODE STATUS: DNR  Allergies  Allergen Reactions  . Bee Venom Anaphylaxis  . Ace Inhibitors   . Angiotensin Receptor Blockers     Tongue swelling.  . Aspirin     Directed by physician  . Cabbage Other (See Comments)    Certain foods due to gout flares  . Latex Rash    Chief Complaint  Patient presents with  . Annual Exam    Annual Exam    HPI:  She is a 85 year old long term resident of this facility being seen for her annual comprehensive exam. She has not been hospitalized during the past year. Her weight has been stable. She denies any heart burn; no changes in her appetite. No joint pain; no anxiety or depressive thoughts. She continue to be followed for her chronic illnesses including: hypertension; chf; gerd.   Past Medical History:  Diagnosis Date  . Anxiety   . Cellulitis 2016  . CHF (congestive heart failure) ([place])   . Chronic kidney disease   . Dementia ([place])   . Dysphagia   . Gait abnormality   . GERD (gastroesophageal reflux disease)   . Glaucoma   . Gout   . Gout, unspecified 01/12/2010   Qualifier: Diagnosis of  By: Moshe Cipro MD, Joycelyn Schmid    . Hyperlipidemia   . Hypertension   . Muscle weakness   . Myocardial infarction [facility]) [place]  . Osteoarthritis   . Syncope     Past Surgical History:  Procedure Laterality Date  . APPENDECTOMY    . CATARACT EXTRACTION W/PHACO  08/04/2011   Procedure: CATARACT EXTRACTION PHACO AND INTRAOCULAR LENS PLACEMENT (IOC);  Surgeon: Tonny Branch;  Location: [facility]S;  Service: Ophthalmology;  Laterality: Right;  CDE=18.53  . CATARACT EXTRACTION W/PHACO  08/25/2011   Procedure: CATARACT EXTRACTION PHACO AND INTRAOCULAR LENS PLACEMENT (IOC);  Surgeon: Tonny Branch;  Location: [facility];  Service: Ophthalmology;  Laterality: Left;  CDE:14.76  . TONSILLECTOMY      Social History   Socioeconomic History  . Marital status:  Divorced    Spouse name: Not on file  . Number of children: Not on file  . Years of education: Not on file  . Highest education level: Not on file  Occupational History  . Occupation: retired   Scientific laboratory technician  . Financial resource strain: Not hard at all  . Food insecurity    Worry: Never true    Inability: Never true  . Transportation needs    Medical: No    Non-medical: No  Tobacco Use  . Smoking status: Never Smoker  . Smokeless tobacco: Never Used  Substance and Sexual Activity  . Alcohol use: No  . Drug use: No  . Sexual activity: Not Currently  Lifestyle  . Physical activity    Days per week: 0 days    Minutes per session: 0 min  . Stress: Only a little  Relationships  . Social connections    Talks on phone: More than three times a week    Gets together: More than three times a week    Attends religious service: Never    Active member of club or organization: No    Attends meetings of clubs or organizations: Never    Relationship status: Divorced  . Intimate partner violence    Fear of current or ex partner: No  Emotionally abused: No    Physically abused: No    Forced sexual activity: No  Other Topics Concern  . Not on file  Social History Narrative  . Not on file   Family History  Problem Relation Age of Onset  . Diabetes Father   . Diabetes Sister   . Diabetes Brother   . Stroke Sister   . Stroke Sister   . Anesthesia problems Neg Hx   . Hypotension Neg Hx   . Malignant hyperthermia Neg Hx   . Pseudochol deficiency Neg Hx       VITAL SIGNS BP (!) 140/57   Pulse 79   Temp (!) 97.2 F (36.2 C) (Oral)   Resp 20   Ht 4\' 11"  (1.499 m)   Wt 167 lb 12.8 oz (76.1 kg)   SpO2 98%   BMI 33.89 kg/m   Outpatient Encounter Medications as of 06/04/2019  Medication Sig  . acetaminophen (TYLENOL) 500 MG tablet Take 1,000 mg by mouth 2 (two) times daily. For leg pain and arthritis. May have additional 1000 mg for break through pain once a day prn  .  Balsam [place]-Castor Oil (VENELEX) OINT Apply topically to sacrum and bilateral buttocks every shift and as needed  . Brinzolamide-Brimonidine (SIMBRINZA) 1-0.2 % SUSP One  drop into both eyes three times a day   . Calcium Carbonate-Vitamin D (CALCIUM-VITAMIN D) 500-200 MG-UNIT tablet Take 1 tablet by mouth 2 (two) times daily.  . citalopram (CELEXA) 20 MG tablet Take 20 mg by mouth daily.  . cycloSPORINE (RESTASIS) 0.05 % ophthalmic emulsion Place 1 drop into both eyes 2 (two) times daily.   Marland Kitchen docusate sodium (COLACE) 100 MG capsule Take 100 mg by mouth 2 (two) times daily.  Marland Kitchen donepezil (ARICEPT) 5 MG tablet Take 5 mg by mouth daily. For dementia  . hydrALAZINE (APRESOLINE) 50 MG tablet Take 50 mg by mouth 2 (two) times a day.   . hydrochlorothiazide (HYDRODIURIL) 12.5 MG tablet Take 12.5 mg by mouth daily.   Marland Kitchen ipratropium-albuterol (DUONEB) 0.5-2.5 (3) MG/3ML SOLN Take 3 mLs by nebulization every 6 (six) hours as needed.  . loratadine (CLARITIN) 10 MG tablet Take 10 mg by mouth daily.  Marland Kitchen LORazepam (ATIVAN) 0.5 MG tablet Take 0.5 tablets (0.25 mg total) by mouth 2 (two) times daily.  . nitroGLYCERIN (NITROSTAT) 0.4 MG SL tablet Place 0.4 mg under the tongue every 5 (five) minutes as needed for chest pain.   . NON FORMULARY Diet Type:  Regular, NAS  . OXYGEN Inhale 2 L/min into the lungs continuous. Wean to keep sats above 90%  . pantoprazole (PROTONIX) 40 MG tablet Take 40 mg by mouth 2 (two) times daily. Do not crush.   . potassium chloride (K-DUR,KLOR-CON) 10 MEQ tablet Take 10 mEq by mouth daily.  . Saline (OCEAN NASAL SPRAY NA) Place 1 spray into the nose every 2 (two) hours as needed.  . Travoprost, BAK Free, (TRAVATAN) 0.004 % SOLN ophthalmic solution Place 1 drop into both eyes at bedtime. For glaucoma   No facility-administered encounter medications on file as of 06/04/2019.      SIGNIFICANT DIAGNOSTIC EXAMS    LABS REVIEWED PREVIOUS:   06-29-18: wbc 4.1; hgb 11.1; hct 36.4; mcv  84.3; plt 224; glucose 94; bun 41; creat 1.38; k+ 3.8; na++ 140; ca 9.0; liver normal albumin 3.9  09-04-18: wbc 4.5; hgb 10.5; hc6 34.8; mcv 85.1 plt 188 glucose 93; bun 27; creat 1.09; k+ 3.6; na++ 139; ca 9.7  03-11-19: wbc 4.7; hgb 11.4; hct 36.7; mcv 83.4; plt 83.4; plt 211; glucose 87; bun 44; creat 1.32; k+ 3.8; na++ 139; ca 9.4; liver normal albumin 4.0   NO NEW LABS.    Review of Systems  Constitutional: Negative for malaise/fatigue.  Respiratory: Negative for cough and shortness of breath.   Cardiovascular: Negative for chest pain, palpitations and leg swelling.  Gastrointestinal: Negative for abdominal pain, constipation and heartburn.  Musculoskeletal: Negative for back pain, joint pain and myalgias.  Skin: Negative.   Neurological: Negative for dizziness.  Psychiatric/Behavioral: The patient is not nervous/anxious.     Physical Exam Constitutional:      General: She is not in acute distress.    Appearance: She is well-developed. She is not diaphoretic.  Eyes:     Comments:  History of bilateral cataract removal with lens implant       Neck:     Musculoskeletal: Neck supple.     Thyroid: No thyromegaly.  Cardiovascular:     Rate and Rhythm: Normal rate and regular rhythm.     Heart sounds: Normal heart sounds.     Comments: Post tibs present bilaterally Pedal pulses faint  Pulmonary:     Effort: Pulmonary effort is normal. No respiratory distress.     Breath sounds: Normal breath sounds.     Comments: 02 dependent  Abdominal:     General: Bowel sounds are normal. There is no distension.     Palpations: Abdomen is soft.     Tenderness: There is no abdominal tenderness.  Musculoskeletal:     Right lower leg: Edema present.     Left lower leg: Edema present.     Comments:  Is able to move all extremities Has 2+ bilateral lower extremity edema     Lymphadenopathy:     Cervical: No cervical adenopathy.  Skin:    General: Skin is warm and dry.  Neurological:      Mental Status: She is alert. Mental status is at baseline.  Psychiatric:        Mood and Affect: Mood normal.      ASSESSMENT/ PLAN:  TODAY:   1. Increased intraocular pressure bilateral: is stable will continue simbrinza to both eyes three times daily and travatan to both eyes nightly  2. Hypokalemia: is stable continue k+ 10 meq daily   3. Vascular dementia with behavioral disturbance. No significant change in status: weight is 167 pounds; albumin 3.9; will continue aricept 5 mg daily. She is slowly losing weight which is an unfortunate but expected outcome at the late stages of this disease process.   4. Primary osteoarthritis multiple joints: is stable continue tylenol 1 mg twice daily   5. CKD (chronic kidney disease) stage 3 GFR 30-59 ml/min: stable bun 27; creat 1.09  6. Chronic constipation: is stable will continue colace twice daily   7. Depression with anxiety: is stable will continue celexa 20 gm daily and ativan 0.25 mg twice daily   8. Chronic diastolic CHF (Congestife heart failure) EF 65-70% (03-04-18) is stable will continue apresoline twice daily and has prn ntg.   9. Deep vein thrombosis (DVT) of left lower extremity unspecified chronicity unspecified vein: is stable will continue to monitor    10. Chronic non-seasonal allergic rhinitis: is stable will continue claritin 10 mg daily   11. Hypertensive heart and kidney disease with chronic diastolic congestive heart failure and stage 3 chronic kidney disease: is stable b/p 140/57 will continue hctz 12.5 mg daily apresoline  50 mg twice daily   12. Chronic respiratory failure with hypoxia on home 02 therapy: is stable is 02 dependent will continue duoneb every 6 hours as needed will continue claritin 10 mg daily   13. GERD without esophagitis: is stable will continue protonix 40 mg twice daily    Health maintenance is up to date.   MD is aware of resident's narcotic use and is in agreement with current plan of  care. We will attempt to wean resident as appropriate.  Ok Edwards NP [facility]  Contact [phone] Monday through Friday 8am- 5pm  After hours call [phone]

## 2019-06-19 ENCOUNTER — Other Ambulatory Visit: Payer: Self-pay | Admitting: Adult Health

## 2019-06-19 MED ORDER — LORAZEPAM 0.5 MG PO TABS: 0.2500 mg | ORAL_TABLET | Freq: Two times a day (BID) | ORAL | 0 refills | Status: DC

## 2019-06-20 DIAGNOSIS — F320 Major depressive disorder, single episode, mild: Secondary | ICD-10-CM | POA: Diagnosis not present

## 2019-06-20 DIAGNOSIS — F411 Generalized anxiety disorder: Secondary | ICD-10-CM | POA: Diagnosis not present

## 2019-06-20 DIAGNOSIS — F428 Other obsessive-compulsive disorder: Secondary | ICD-10-CM | POA: Diagnosis not present

## 2019-06-21 DIAGNOSIS — B351 Tinea unguium: Secondary | ICD-10-CM | POA: Diagnosis not present

## 2019-06-21 DIAGNOSIS — I739 Peripheral vascular disease, unspecified: Secondary | ICD-10-CM | POA: Diagnosis not present

## 2019-06-21 DIAGNOSIS — M79674 Pain in right toe(s): Secondary | ICD-10-CM | POA: Diagnosis not present

## 2019-06-21 DIAGNOSIS — M79675 Pain in left toe(s): Secondary | ICD-10-CM | POA: Diagnosis not present

## 2019-07-04 ENCOUNTER — Encounter: Payer: Self-pay | Admitting: Adult Health

## 2019-07-04 ENCOUNTER — Non-Acute Institutional Stay (SKILLED_NURSING_FACILITY): Payer: Medicare Other | Admitting: Adult Health

## 2019-07-04 DIAGNOSIS — F428 Other obsessive-compulsive disorder: Secondary | ICD-10-CM | POA: Diagnosis not present

## 2019-07-04 DIAGNOSIS — F411 Generalized anxiety disorder: Secondary | ICD-10-CM | POA: Diagnosis not present

## 2019-07-04 DIAGNOSIS — K5909 Other constipation: Secondary | ICD-10-CM

## 2019-07-04 DIAGNOSIS — M159 Polyosteoarthritis, unspecified: Secondary | ICD-10-CM

## 2019-07-04 DIAGNOSIS — M8949 Other hypertrophic osteoarthropathy, multiple sites: Secondary | ICD-10-CM | POA: Diagnosis not present

## 2019-07-04 DIAGNOSIS — N1832 Chronic kidney disease, stage 3b: Secondary | ICD-10-CM | POA: Diagnosis not present

## 2019-07-04 DIAGNOSIS — F320 Major depressive disorder, single episode, mild: Secondary | ICD-10-CM | POA: Diagnosis not present

## 2019-07-04 NOTE — Progress Notes (Signed)
Location:    [facility] Room Number: 122/D Place of Service:  SNF (31)   CODE STATUS: DNR  Allergies  Allergen Reactions  . Bee Venom Anaphylaxis  . Ace Inhibitors   . Angiotensin Receptor Blockers     Tongue swelling.  . Aspirin     Directed by physician  . Cabbage Other (See Comments)    Certain foods due to gout flares  . Latex Rash    Chief Complaint  Patient presents with  . Medical Management of Chronic Issues        Primary osteoarthritis multiple joints:  CKD (chronic kidney disease) stage 3 GFR 30-59 ml/min   Chronic constipation;    HPI:  She is 85 year old long term resident of this facility being seen for the management of her chronic illnesses: osteoarthritis; ckd; constipation. She denies any problems with constipation or diarrhea. No uncontrolled joint pain. No anxiety or agitation.   Past Medical History:  Diagnosis Date  . Anxiety   . Cellulitis 2016  . CHF (congestive heart failure) ([place])   . Chronic kidney disease   . Dementia ([place])   . Dysphagia   . Gait abnormality   . GERD (gastroesophageal reflux disease)   . Glaucoma   . Gout   . Gout, unspecified 01/12/2010   Qualifier: Diagnosis of  By: Moshe Cipro MD, Joycelyn Schmid    . Hyperlipidemia   . Hypertension   . Muscle weakness   . Myocardial infarction [facility]) [place]  . Osteoarthritis   . Syncope     Past Surgical History:  Procedure Laterality Date  . APPENDECTOMY    . CATARACT EXTRACTION W/PHACO  08/04/2011   Procedure: CATARACT EXTRACTION PHACO AND INTRAOCULAR LENS PLACEMENT (IOC);  Surgeon: Tonny Branch;  Location: [facility];  Service: Ophthalmology;  Laterality: Right;  CDE=18.53  . CATARACT EXTRACTION W/PHACO  08/25/2011   Procedure: CATARACT EXTRACTION PHACO AND INTRAOCULAR LENS PLACEMENT (IOC);  Surgeon: Tonny Branch;  Location: [facility];  Service: Ophthalmology;  Laterality: Left;  CDE:14.76  . TONSILLECTOMY      Social History   Socioeconomic History  .  Marital status: Divorced    Spouse name: Not on file  . Number of children: Not on file  . Years of education: Not on file  . Highest education level: Not on file  Occupational History  . Occupation: retired   Scientific laboratory technician  . Financial resource strain: Not hard at all  . Food insecurity    Worry: Never true    Inability: Never true  . Transportation needs    Medical: No    Non-medical: No  Tobacco Use  . Smoking status: Never Smoker  . Smokeless tobacco: Never Used  Substance and Sexual Activity  . Alcohol use: No  . Drug use: No  . Sexual activity: Not Currently  Lifestyle  . Physical activity    Days per week: 0 days    Minutes per session: 0 min  . Stress: Only a little  Relationships  . Social connections    Talks on phone: More than three times a week    Gets together: More than three times a week    Attends religious service: Never    Active member of club or organization: No    Attends meetings of clubs or organizations: Never    Relationship status: Divorced  . Intimate partner violence    Fear of current or ex partner: No    Emotionally  abused: No    Physically abused: No    Forced sexual activity: No  Other Topics Concern  . Not on file  Social History Narrative  . Not on file   Family History  Problem Relation Age of Onset  . Diabetes Father   . Diabetes Sister   . Diabetes Brother   . Stroke Sister   . Stroke Sister   . Anesthesia problems Neg Hx   . Hypotension Neg Hx   . Malignant hyperthermia Neg Hx   . Pseudochol deficiency Neg Hx       VITAL SIGNS BP 135/61   Pulse 79   Temp 98.5 F (36.9 C) (Oral)   Resp 20   Ht 4\' 11"  (1.499 m)   Wt 167 lb 3.2 oz (75.8 kg)   SpO2 95%   BMI 33.77 kg/m   Outpatient Encounter Medications as of 07/04/2019  Medication Sig  . acetaminophen (TYLENOL) 500 MG tablet Take 1,000 mg by mouth 2 (two) times daily. For leg pain and arthritis. May have additional 1000 mg for break through pain once a day prn   . Balsam [place]-Castor Oil (VENELEX) OINT Apply topically to sacrum and bilateral buttocks every shift and as needed  . Brinzolamide-Brimonidine (SIMBRINZA) 1-0.2 % SUSP One  drop into both eyes three times a day   . Calcium Carbonate-Vitamin D (CALCIUM-VITAMIN D) 500-200 MG-UNIT tablet Take 1 tablet by mouth 2 (two) times daily.  . citalopram (CELEXA) 20 MG tablet Take 20 mg by mouth daily.  . cycloSPORINE (RESTASIS) 0.05 % ophthalmic emulsion Place 1 drop into both eyes 2 (two) times daily.   Marland Kitchen docusate sodium (COLACE) 100 MG capsule Take 100 mg by mouth 2 (two) times daily.  Marland Kitchen donepezil (ARICEPT) 5 MG tablet Take 5 mg by mouth daily. For dementia  . hydrALAZINE (APRESOLINE) 50 MG tablet Take 50 mg by mouth 2 (two) times a day.   . hydrochlorothiazide (HYDRODIURIL) 12.5 MG tablet Take 12.5 mg by mouth daily.   Marland Kitchen ipratropium-albuterol (DUONEB) 0.5-2.5 (3) MG/3ML SOLN Take 3 mLs by nebulization every 6 (six) hours as needed.  . loratadine (CLARITIN) 10 MG tablet Take 10 mg by mouth daily.  Marland Kitchen LORazepam (ATIVAN) 0.5 MG tablet Take 0.5 tablets (0.25 mg total) by mouth 2 (two) times daily.  . nitroGLYCERIN (NITROSTAT) 0.4 MG SL tablet Place 0.4 mg under the tongue every 5 (five) minutes as needed for chest pain.   . NON FORMULARY Diet Type:  Regular, NAS  . OXYGEN Inhale 2 L/min into the lungs continuous. Wean to keep sats above 90%  . pantoprazole (PROTONIX) 40 MG tablet Take 40 mg by mouth 2 (two) times daily. Do not crush.   . potassium chloride (K-DUR,KLOR-CON) 10 MEQ tablet Take 10 mEq by mouth daily.  . Saline (OCEAN NASAL SPRAY NA) Place 1 spray into the nose every 2 (two) hours as needed.  . Travoprost, BAK Free, (TRAVATAN) 0.004 % SOLN ophthalmic solution Place 1 drop into both eyes at bedtime. For glaucoma   No facility-administered encounter medications on file as of 07/04/2019.      SIGNIFICANT DIAGNOSTIC EXAMS   LABS REVIEWED PREVIOUS:   06-29-18: wbc 4.1; hgb 11.1; hct 36.4; mcv  84.3; plt 224; glucose 94; bun 41; creat 1.38; k+ 3.8; na++ 140; ca 9.0; liver normal albumin 3.9  09-04-18: wbc 4.5; hgb 10.5; hc6 34.8; mcv 85.1 plt 188 glucose 93; bun 27; creat 1.09; k+ 3.6; na++ 139; ca 9.7  03-11-19: wbc 4.7; hgb  11.4; hct 36.7; mcv 83.4; plt 83.4; plt 211; glucose 87; bun 44; creat 1.32; k+ 3.8; na++ 139; ca 9.4; liver normal albumin 4.0   NO NEW LABS.    Review of Systems  Constitutional: Negative for malaise/fatigue.  Respiratory: Negative for cough and shortness of breath.   Cardiovascular: Negative for chest pain, palpitations and leg swelling.  Gastrointestinal: Negative for abdominal pain, constipation and heartburn.  Musculoskeletal: Negative for back pain, joint pain and myalgias.  Skin: Negative.   Neurological: Negative for dizziness.  Psychiatric/Behavioral: The patient is not nervous/anxious.    Physical Exam Constitutional:      General: She is not in acute distress.    Appearance: She is well-developed. She is not diaphoretic.  Eyes:     Comments: History of bilateral cataract removal with lens implant      Neck:     Musculoskeletal: Neck supple.     Thyroid: No thyromegaly.  Cardiovascular:     Rate and Rhythm: Normal rate and regular rhythm.     Heart sounds: Normal heart sounds.     Comments:  Post tibs present bilaterally Pedal pulses faint  Pulmonary:     Effort: Pulmonary effort is normal. No respiratory distress.     Breath sounds: Normal breath sounds.     Comments: 02 dependent  Abdominal:     General: Bowel sounds are normal. There is no distension.     Palpations: Abdomen is soft.     Tenderness: There is no abdominal tenderness.  Musculoskeletal:     Right lower leg: Edema present.     Left lower leg: Edema present.     Comments: Is able to move all extremities Has 2+ bilateral lower extremity edema      Lymphadenopathy:     Cervical: No cervical adenopathy.  Skin:    General: Skin is warm and dry.  Neurological:      Mental Status: She is alert. Mental status is at baseline.  Psychiatric:        Mood and Affect: Mood normal.       ASSESSMENT/ PLAN:  TODAY:   1. Primary osteoarthritis multiple joints: is stable will continue tylenol 1 gm twice daily   2. CKD (chronic kidney disease) stage 3 GFR 30-59 ml/min is stable bun 27; creat 1.09  3. Chronic constipation; will continue colace twice daily   PREVIOUS  4. Depression with anxiety: is stable will continue celexa 20 mg daily and ativan 0.25 mg twice daily   5. Chronic diastolic CHF (Congestife heart failure) EF 65-70% (03-04-18) is stable will continue apresoline twice daily and has prn ntg.   6. Deep vein thrombosis (DVT) of left lower extremity unspecified chronicity unspecified vein: is stable will continue to monitor    7. Chronic non-seasonal allergic rhinitis: is stable will continue claritin 10 mg daily   8. Hypertensive heart and kidney disease with chronic diastolic congestive heart failure and stage 3 chronic kidney disease: is stable b/p 135/61 will continue hctz 12.5 mg daily apresoline 50 mg twice daily   9. Chronic respiratory failure with hypoxia on home 02 therapy: is stable is 02 dependent will continue claritin 10 mg daily   10. GERD without esophagitis: is stable will continue protonix 40 mg twice daily   11. Increased intraocular pressure bilateral: is stable will continue simbrinza to both eyes three times daily and travatan to both eyes nightly  12. Hypokalemia: is stable continue k+ 10 meq daily   13. Vascular dementia with  behavioral disturbance. No significant change in status: weight is 167 pounds; albumin 3.9; will continue aricept 5 mg daily. She is slowly losing weight which is an unfortunate but expected outcome at the late stages of this disease process.        MD is aware of resident's narcotic use and is in agreement with current plan of care. We will attempt to wean resident as appropriate.  Ok Edwards NP [facility]  Contact [phone] Monday through Friday 8am- 5pm  After hours call [phone]

## 2019-07-08 DIAGNOSIS — H401134 Primary open-angle glaucoma, bilateral, indeterminate stage: Secondary | ICD-10-CM | POA: Diagnosis not present

## 2019-07-08 DIAGNOSIS — H04123 Dry eye syndrome of bilateral lacrimal glands: Secondary | ICD-10-CM | POA: Diagnosis not present

## 2019-07-08 DIAGNOSIS — Z961 Presence of intraocular lens: Secondary | ICD-10-CM | POA: Diagnosis not present

## 2019-07-15 ENCOUNTER — Other Ambulatory Visit: Payer: Self-pay | Admitting: Adult Health

## 2019-07-15 MED ORDER — LORAZEPAM 0.5 MG PO TABS: 0.2500 mg | ORAL_TABLET | Freq: Two times a day (BID) | ORAL | 0 refills | Status: DC

## 2019-07-22 ENCOUNTER — Encounter: Payer: Self-pay | Admitting: Internal Medicine

## 2019-07-22 ENCOUNTER — Inpatient Hospital Stay (HOSPITAL_COMMUNITY): Payer: Medicare Other | Attending: Internal Medicine

## 2019-07-23 ENCOUNTER — Non-Acute Institutional Stay (SKILLED_NURSING_FACILITY): Payer: Medicare Other | Admitting: Adult Health

## 2019-07-23 ENCOUNTER — Encounter: Payer: Self-pay | Admitting: Adult Health

## 2019-07-23 ENCOUNTER — Telehealth: Payer: Self-pay | Admitting: Orthopedic Surgery

## 2019-07-23 DIAGNOSIS — S52134A Nondisplaced fracture of neck of right radius, initial encounter for closed fracture: Secondary | ICD-10-CM | POA: Diagnosis not present

## 2019-07-23 NOTE — Telephone Encounter (Signed)
Has she been to the ER ??  If not apply a sling call us back in 2 weeks   In office visit not needed

## 2019-07-23 NOTE — Telephone Encounter (Signed)
I received a call from Assunta Curtis [phone]) from [facility].  She stated that this patient fell out of her chair last night and sustained an Impacted fracture of the radial neck junction.  Please review her chart and advise as to whether to schedule here or what should be done for this patient.  Thanks

## 2019-07-23 NOTE — Telephone Encounter (Signed)
I had tried multiple times to call [name] and then just the [facility] in general but could not get through.  I finally spoke to Alta and she took the message as I read it to her from Dr Aline Brochure earlier.  I offered for her to go into the patient's chart and look at the response herself.  She said she would pass this information on.

## 2019-07-23 NOTE — Progress Notes (Signed)
Location:    [place] Room Number: 122/D Place of Service:  SNF (31)   CODE STATUS: DNR  Allergies  Allergen Reactions  . Bee Venom Anaphylaxis  . Ace Inhibitors   . Angiotensin Receptor Blockers     Tongue swelling.  . Aspirin     Directed by physician  . Cabbage Other (See Comments)    Certain foods due to gout flares  . Latex Rash    Chief Complaint  Patient presents with  . Acute Visit    Follow Up Fall     HPI:    Past Medical History:  Diagnosis Date  . Anxiety   . Cellulitis 2016  . CHF (congestive heart failure) ([place])   . Chronic kidney disease   . Dementia ([place])   . Dysphagia   . Gait abnormality   . GERD (gastroesophageal reflux disease)   . Glaucoma   . Gout   . Gout, unspecified 01/12/2010   Qualifier: Diagnosis of  By: Moshe Cipro MD, Joycelyn Schmid    . Hyperlipidemia   . Hypertension   . Muscle weakness   . Myocardial infarction [facility]) [place]  . Osteoarthritis   . Syncope     Past Surgical History:  Procedure Laterality Date  . APPENDECTOMY    . CATARACT EXTRACTION W/PHACO  08/04/2011   Procedure: CATARACT EXTRACTION PHACO AND INTRAOCULAR LENS PLACEMENT (IOC);  Surgeon: Tonny Branch;  Location: [facility];  Service: Ophthalmology;  Laterality: Right;  CDE=18.53  . CATARACT EXTRACTION W/PHACO  08/25/2011   Procedure: CATARACT EXTRACTION PHACO AND INTRAOCULAR LENS PLACEMENT (IOC);  Surgeon: Tonny Branch;  Location: [facility];  Service: Ophthalmology;  Laterality: Left;  CDE:14.76  . TONSILLECTOMY      Social History   Socioeconomic History  . Marital status: Divorced    Spouse name: Not on file  . Number of children: Not on file  . Years of education: Not on file  . Highest education level: Not on file  Occupational History  . Occupation: retired   Scientific laboratory technician  . Financial resource strain: Not hard at all  . Food insecurity    Worry: Never true    Inability: Never true  . Transportation needs    Medical: No   Non-medical: No  Tobacco Use  . Smoking status: Never Smoker  . Smokeless tobacco: Never Used  Substance and Sexual Activity  . Alcohol use: No  . Drug use: No  . Sexual activity: Not Currently  Lifestyle  . Physical activity    Days per week: 0 days    Minutes per session: 0 min  . Stress: Only a little  Relationships  . Social connections    Talks on phone: More than three times a week    Gets together: More than three times a week    Attends religious service: Never    Active member of club or organization: No    Attends meetings of clubs or organizations: Never    Relationship status: Divorced  . Intimate partner violence    Fear of current or ex partner: No    Emotionally abused: No    Physically abused: No    Forced sexual activity: No  Other Topics Concern  . Not on file  Social History Narrative  . Not on file   Family History  Problem Relation Age of Onset  . Diabetes Father   . Diabetes Sister   . Diabetes Brother   . Stroke Sister   .  Stroke Sister   . Anesthesia problems Neg Hx   . Hypotension Neg Hx   . Malignant hyperthermia Neg Hx   . Pseudochol deficiency Neg Hx       VITAL SIGNS BP (!) 180/62   Pulse 66   Temp 98.4 F (36.9 C) (Oral)   Resp 18   Ht 4\' 11"  (1.499 m)   Wt 167 lb 3.2 oz (75.8 kg)   SpO2 99%   BMI 33.77 kg/m   Outpatient Encounter Medications as of 07/23/2019  Medication Sig  . acetaminophen (TYLENOL) 500 MG tablet Take 1,000 mg by mouth 2 (two) times daily. For leg pain and arthritis. May have additional 1000 mg for break through pain once a day prn  . Balsam [place]-Castor Oil (VENELEX) OINT Apply topically to sacrum and bilateral buttocks every shift and as needed  . Brinzolamide-Brimonidine (SIMBRINZA) 1-0.2 % SUSP One  drop into both eyes three times a day   . Calcium Carbonate-Vitamin D (CALCIUM-VITAMIN D) 500-200 MG-UNIT tablet Take 1 tablet by mouth 2 (two) times daily.  . citalopram (CELEXA) 20 MG tablet Take 20 mg by  mouth daily.  . cycloSPORINE (RESTASIS) 0.05 % ophthalmic emulsion Place 1 drop into both eyes 2 (two) times daily.   Marland Kitchen docusate sodium (COLACE) 100 MG capsule Take 100 mg by mouth 2 (two) times daily.  Marland Kitchen donepezil (ARICEPT) 5 MG tablet Take 5 mg by mouth daily. For dementia  . hydrALAZINE (APRESOLINE) 50 MG tablet Take 50 mg by mouth 2 (two) times a day.   . hydrochlorothiazide (HYDRODIURIL) 12.5 MG tablet Take 12.5 mg by mouth daily.   Marland Kitchen ipratropium-albuterol (DUONEB) 0.5-2.5 (3) MG/3ML SOLN Take 3 mLs by nebulization every 6 (six) hours as needed.  . loratadine (CLARITIN) 10 MG tablet Take 10 mg by mouth daily.  Marland Kitchen LORazepam (ATIVAN) 0.5 MG tablet Take 0.5 tablets (0.25 mg total) by mouth 2 (two) times daily.  . nitroGLYCERIN (NITROSTAT) 0.4 MG SL tablet Place 0.4 mg under the tongue every 5 (five) minutes as needed for chest pain.   . NON FORMULARY Diet Type:  Regular, NAS  . OXYGEN Inhale 2 L/min into the lungs continuous. Wean to keep sats above 90%  . pantoprazole (PROTONIX) 40 MG tablet Take 40 mg by mouth daily. Do not crush.   . potassium chloride (K-DUR,KLOR-CON) 10 MEQ tablet Take 10 mEq by mouth daily.  . Saline (OCEAN NASAL SPRAY NA) Place 1 spray into the nose every 2 (two) hours as needed.  . Travoprost, BAK Free, (TRAVATAN) 0.004 % SOLN ophthalmic solution Place 1 drop into both eyes at bedtime. For glaucoma   No facility-administered encounter medications on file as of 07/23/2019.      SIGNIFICANT DIAGNOSTIC EXAMS  TODAY;   07-22-19: right forearm x-ray  1. Impacted fracture of the radial neck junction. 2. Suspect additional nondisplaced fracture of the coronoid process. 3. Right oval swelling and trace joint effusion. 4. Degenerative arthrosis of the wrist and elbow. Enthesopathic changes of the olecranon.  07-22-19: right shoulder x-ray  1. The osseous structures appear diffusely demineralized which may limit detection of small or nondisplaced fractures. 2. No  acute fracture or traumatic malalignment. 3. High-riding appearance of the humeral head likely reflecting rotator cuff insufficiency. 4. Moderate osteoarthrosis of the acromioclavicular and glenohumeral joints.     LABS REVIEWED PREVIOUS:   09-04-18: wbc 4.5; hgb 10.5; hc6 34.8; mcv 85.1 plt 188 glucose 93; bun 27; creat 1.09; k+ 3.6; na++ 139; ca 9.7  03-11-19: wbc 4.7; hgb 11.4; hct 36.7; mcv 83.4; plt 83.4; plt 211; glucose 87; bun 44; creat 1.32; k+ 3.8; na++ 139; ca 9.4; liver normal albumin 4.0   NO NEW LABS.    Review of Systems  Constitutional: Negative for malaise/fatigue.  Respiratory: Negative for cough and shortness of breath.   Cardiovascular: Negative for chest pain, palpitations and leg swelling.  Gastrointestinal: Negative for abdominal pain, constipation and heartburn.  Musculoskeletal: Negative for back pain, joint pain and myalgias.  Skin: Negative.   Neurological: Negative for dizziness.  Psychiatric/Behavioral: The patient is not nervous/anxious.     Physical Exam Constitutional:      General: She is not in acute distress.    Appearance: She is well-developed. She is not diaphoretic.  Eyes:     Comments: History of bilateral cataract removal with lens implant     Neck:     Musculoskeletal: Neck supple.     Thyroid: No thyromegaly.  Cardiovascular:     Rate and Rhythm: Normal rate and regular rhythm.     Heart sounds: Normal heart sounds.     Comments:  Post tibs present bilaterally Pedal pulses faint  Pulmonary:     Effort: Pulmonary effort is normal. No respiratory distress.     Breath sounds: Normal breath sounds.     Comments: 02 dependent  Abdominal:     General: Bowel sounds are normal. There is no distension.     Palpations: Abdomen is soft.     Tenderness: There is no abdominal tenderness.  Musculoskeletal:     Right lower leg: Edema present.     Left lower leg: Edema present.     Comments: Is able to move all extremities Has 2+ bilateral  lower extremity edema      Has right forearm swelling: denies any pain   Lymphadenopathy:     Cervical: No cervical adenopathy.  Skin:    General: Skin is warm and dry.  Neurological:     Mental Status: She is alert. Mental status is at baseline.  Psychiatric:        Mood and Affect: Mood normal.        ASSESSMENT/ PLAN:  TODAY;   1. Closed nondisplaced fracture of neck of radius initial encounter  Ortho has been consulted; will place in sling and they will see in 2 weeks. They have reviewed the films.  Will monitor her status     MD is aware of resident's narcotic use and is in agreement with current plan of care. We will attempt to wean resident as appropriate.  Ok Edwards NP [facility]  Contact [phone] Monday through Friday 8am- 5pm  After hours call [phone]

## 2019-07-24 ENCOUNTER — Encounter: Payer: Self-pay | Admitting: Adult Health

## 2019-07-24 NOTE — Progress Notes (Signed)
Location:  [place] Room Number: 122/D Place of Service:  SNF (31) Provider: Ok Edwards NP   Patient Care Team: Hennie Duos, MD as PCP - General (Internal Medicine) Nyoka Cowden Phylis Bougie, NP as Nurse Practitioner ([facility] [place] ([place])  Extended Emergency Contact Information Primary Emergency Contact: [place], [place] [address] [name] of [place] Phone: [phone] Relation: Daughter Secondary Emergency Contact: [name] [place] of [place] Phone: [phone] Relation: Son  Code Status: dnr Goals of Care: Advanced Directive information Advanced Directives 07/24/2019  Does Patient Have a Medical Advance Directive? Yes  Type of Advance Directive Out of facility DNR (pink MOST or yellow form)  Does patient want to make changes to medical advance directive? No - Patient declined  Copy of [place] in Chart? -  Would patient like information on creating a medical advance directive? -  Pre-existing out of facility DNR order (yellow form or pink MOST form) -     Chief Complaint  Patient presents with  . Medicare Wellness    Annual Wellness Visit    HPI: Patient is a 85 y.o. female seen in today for an annual wellness exam.    Depression screen [facility] 2/9 07/24/2019 06/09/2019 05/31/2018 05/22/2017  Decreased Interest 0 0 0 0  Down, Depressed, Hopeless 0 0 0 0  PHQ - 2 Score 0 0 0 0  Some recent data might be hidden    Fall Risk  07/24/2019 06/09/2019 05/31/2018 05/22/2017  Falls in the past year? 1 0 No No  Number falls in past yr: 1 - - -  Injury with Fall? 1 - - -  Risk for fall due to : History of fall(s);Impaired balance/gait;Impaired mobility - - -  Follow up Falls evaluation completed - - -     Health Maintenance  Topic Date Due  . TETANUS/TDAP  06/25/2027  . INFLUENZA VACCINE  Completed  . DEXA SCAN  Completed  . PNA  vac Low Risk Adult  Completed     Functional Status Survey: Is the patient deaf or have difficulty hearing?: Yes Does the patient have difficulty seeing, even when wearing glasses/contacts?: No Does the patient have difficulty concentrating, remembering, or making decisions?: Yes Does the patient have difficulty walking or climbing stairs?: Yes Does the patient have difficulty dressing or bathing?: Yes   Past Medical History:  Diagnosis Date  . Anxiety   . Cellulitis 2016  . CHF (congestive heart failure) ([place])   . Chronic kidney disease   . Dementia ([place])   . Dysphagia   . Gait abnormality   . GERD (gastroesophageal reflux disease)   . Glaucoma   . Gout   . Gout, unspecified 01/12/2010   Qualifier: Diagnosis of  By: Moshe Cipro MD, Joycelyn Schmid    . Hyperlipidemia   . Hypertension   . Muscle weakness   . Myocardial infarction [facility] [place]  . Osteoarthritis   . Syncope     Past Surgical History:  Procedure Laterality Date  . APPENDECTOMY    . CATARACT EXTRACTION W/PHACO  08/04/2011   Procedure: CATARACT EXTRACTION PHACO AND INTRAOCULAR LENS PLACEMENT (IOC);  Surgeon: Tonny Branch;  Location: [facility] ORS;  Service: Ophthalmology;  Laterality: Right;  CDE=18.53  . CATARACT EXTRACTION W/PHACO  08/25/2011   Procedure: CATARACT EXTRACTION PHACO AND INTRAOCULAR LENS PLACEMENT (IOC);  Surgeon: Tonny Branch;  Location:  [facility] ORS;  Service: Ophthalmology;  Laterality: Left;  CDE:14.76  . TONSILLECTOMY      Family History  Problem Relation Age of Onset  . Diabetes Father   . Diabetes Sister   . Diabetes Brother   . Stroke Sister   . Stroke Sister   . Anesthesia problems Neg Hx   . Hypotension Neg Hx   . Malignant hyperthermia Neg Hx   . Pseudochol deficiency Neg Hx     Social History   Socioeconomic History  . Marital status: Divorced    Spouse name: Not on file  . Number of children: Not on file  . Years of education: Not on file  . Highest education level: Not on file   Occupational History  . Occupation: retired   Scientific laboratory technician  . Financial resource strain: Not hard at all  . Food insecurity    Worry: Never true    Inability: Never true  . Transportation needs    Medical: No    Non-medical: No  Tobacco Use  . Smoking status: Never Smoker  . Smokeless tobacco: Never Used  Substance and Sexual Activity  . Alcohol use: No  . Drug use: No  . Sexual activity: Not Currently  Lifestyle  . Physical activity    Days per week: 0 days    Minutes per session: 0 min  . Stress: Only a little  Relationships  . Social connections    Talks on phone: More than three times a week    Gets together: More than three times a week    Attends religious service: Never    Active member of club or organization: No    Attends meetings of clubs or organizations: Never    Relationship status: Divorced  Other Topics Concern  . Not on file  Social History Narrative   Long term resident [facility]     reports that she has never smoked. She has never used smokeless tobacco. She reports that she does not drink alcohol or use drugs.   Allergies  Allergen Reactions  . Bee Venom Anaphylaxis  . Ace Inhibitors   . Angiotensin Receptor Blockers     Tongue swelling.  . Aspirin     Directed by physician  . Cabbage Other (See Comments)    Certain foods due to gout flares  . Latex Rash    Outpatient Encounter Medications as of 07/24/2019  Medication Sig  . acetaminophen (TYLENOL) 500 MG tablet Take 1,000 mg by mouth 2 (two) times daily. For leg pain and arthritis. May have additional 1000 mg for break through pain once a day prn  . Balsam Peru-Castor Oil (VENELEX) OINT Apply topically to sacrum and bilateral buttocks every shift and as needed  . Brinzolamide-Brimonidine (SIMBRINZA) 1-0.2 % SUSP One  drop into both eyes three times a day   . Calcium Carbonate-Vitamin D (CALCIUM-VITAMIN D) 500-200 MG-UNIT tablet Take 1 tablet by mouth 2 (two) times daily.  . citalopram (CELEXA)  20 MG tablet Take 20 mg by mouth daily.  . cycloSPORINE (RESTASIS) 0.05 % ophthalmic emulsion Place 1 drop into both eyes 2 (two) times daily.   Marland Kitchen docusate sodium (COLACE) 100 MG capsule Take 100 mg by mouth 2 (two) times daily.  Marland Kitchen donepezil (ARICEPT) 5 MG tablet Take 5 mg by mouth daily. For dementia  . hydrALAZINE (APRESOLINE) 50 MG tablet Take 50 mg by mouth 2 (two) times a day.   . hydrochlorothiazide (HYDRODIURIL) 12.5 MG tablet Take 12.5 mg by mouth  daily.   . ipratropium-albuterol (DUONEB) 0.5-2.5 (3) MG/3ML SOLN Take 3 mLs by nebulization every 6 (six) hours as needed.  . loratadine (CLARITIN) 10 MG tablet Take 10 mg by mouth daily.  Marland Kitchen LORazepam (ATIVAN) 0.5 MG tablet Take 0.5 tablets (0.25 mg total) by mouth 2 (two) times daily.  . nitroGLYCERIN (NITROSTAT) 0.4 MG SL tablet Place 0.4 mg under the tongue every 5 (five) minutes as needed for chest pain.   . NON FORMULARY Diet Type:  Regular, NAS  . OXYGEN Inhale 2 L/min into the lungs continuous. Wean to keep sats above 90%  . pantoprazole (PROTONIX) 40 MG tablet Take 40 mg by mouth daily. Do not crush.   . potassium chloride (K-DUR,KLOR-CON) 10 MEQ tablet Take 10 mEq by mouth daily.  . Saline (OCEAN NASAL SPRAY NA) Place 1 spray into the nose every 2 (two) hours as needed.  . Travoprost, BAK Free, (TRAVATAN) 0.004 % SOLN ophthalmic solution Place 1 drop into both eyes at bedtime. For glaucoma   No facility-administered encounter medications on file as of 07/24/2019.      Review of Systems:  Review of Systems  Physical Exam: Vitals:   07/24/19 1429  BP: (!) 180/62  Pulse: 66  Resp: 18  Temp: 98.4 F (36.9 C)  TempSrc: Oral  SpO2: 98%  Weight: 167 lb 3.2 oz (75.8 kg)  Height: 4\' 11"  (1.499 m)   Body mass index is 33.77 kg/m. Physical Exam    SIGNIFICANT DIAGNOSTIC EXAMS  PREVIOUS GET X-RAY RESULT    LABS REVIEWED PREVIOUS:   06-29-18: wbc 4.1; hgb 11.1; hct 36.4; mcv 84.3; plt 224; glucose 94; bun 41; creat  1.38; k+ 3.8; na++ 140; ca 9.0; liver normal albumin 3.9  09-04-18: wbc 4.5; hgb 10.5; hc6 34.8; mcv 85.1 plt 188 glucose 93; bun 27; creat 1.09; k+ 3.6; na++ 139; ca 9.7  03-11-19: wbc 4.7; hgb 11.4; hct 36.7; mcv 83.4; plt 83.4; plt 211; glucose 87; bun 44; creat 1.32; k+ 3.8; na++ 139; ca 9.4; liver normal albumin 4.0   NO NEW LABS.        Assessment/Plan       Ok Edwards NP [facility]  Contact [phone] Monday through Friday 8am- 5pm  After hours call [phone]          This encounter was created in error - please disregard.

## 2019-08-04 ENCOUNTER — Other Ambulatory Visit: Payer: Self-pay | Admitting: Internal Medicine

## 2019-08-04 ENCOUNTER — Other Ambulatory Visit (HOSPITAL_COMMUNITY)
Admission: RE | Admit: 2019-08-04 | Discharge: 2019-08-04 | Disposition: A | Discharge status: Home / Self Care | Payer: Medicare Other | Source: Ambulatory Visit | Attending: Internal Medicine | Admitting: Internal Medicine

## 2019-08-04 DIAGNOSIS — Z9189 Other specified personal risk factors, not elsewhere classified: Secondary | ICD-10-CM

## 2019-08-04 DIAGNOSIS — Z20828 Contact with and (suspected) exposure to other viral communicable diseases: Secondary | ICD-10-CM | POA: Insufficient documentation

## 2019-08-06 DIAGNOSIS — S52131A Displaced fracture of neck of right radius, initial encounter for closed fracture: Secondary | ICD-10-CM | POA: Insufficient documentation

## 2019-08-06 DIAGNOSIS — Z9181 History of falling: Secondary | ICD-10-CM | POA: Diagnosis not present

## 2019-08-06 DIAGNOSIS — S5291XA Unspecified fracture of right forearm, initial encounter for closed fracture: Secondary | ICD-10-CM | POA: Insufficient documentation

## 2019-08-06 DIAGNOSIS — R279 Unspecified lack of coordination: Secondary | ICD-10-CM | POA: Diagnosis not present

## 2019-08-06 DIAGNOSIS — R262 Difficulty in walking, not elsewhere classified: Secondary | ICD-10-CM | POA: Diagnosis not present

## 2019-08-06 DIAGNOSIS — M159 Polyosteoarthritis, unspecified: Secondary | ICD-10-CM | POA: Diagnosis not present

## 2019-08-06 HISTORY — DX: Displaced fracture of neck of right radius, initial encounter for closed fracture: S52.131A

## 2019-08-07 ENCOUNTER — Other Ambulatory Visit (HOSPITAL_COMMUNITY)
Admission: RE | Admit: 2019-08-07 | Discharge: 2019-08-07 | Disposition: A | Discharge status: Home / Self Care | Payer: Medicare Other | Source: Ambulatory Visit | Attending: Internal Medicine | Admitting: Internal Medicine

## 2019-08-07 DIAGNOSIS — R279 Unspecified lack of coordination: Secondary | ICD-10-CM | POA: Diagnosis not present

## 2019-08-07 DIAGNOSIS — R509 Fever, unspecified: Secondary | ICD-10-CM | POA: Insufficient documentation

## 2019-08-07 DIAGNOSIS — Z9181 History of falling: Secondary | ICD-10-CM | POA: Diagnosis not present

## 2019-08-07 DIAGNOSIS — M159 Polyosteoarthritis, unspecified: Secondary | ICD-10-CM | POA: Diagnosis not present

## 2019-08-07 DIAGNOSIS — Z20828 Contact with and (suspected) exposure to other viral communicable diseases: Secondary | ICD-10-CM | POA: Insufficient documentation

## 2019-08-07 DIAGNOSIS — R262 Difficulty in walking, not elsewhere classified: Secondary | ICD-10-CM | POA: Diagnosis not present

## 2019-08-07 LAB — RESPIRATORY PANEL BY RT PCR (FLU A&B, COVID)
Influenza A by PCR: NEGATIVE
Influenza B by PCR: NEGATIVE
SARS Coronavirus 2 by RT PCR: NEGATIVE

## 2019-08-07 LAB — SARS CORONAVIRUS 2 (TAT 6-24 HRS): SARS Coronavirus 2: NEGATIVE

## 2019-08-08 ENCOUNTER — Other Ambulatory Visit: Payer: Self-pay | Admitting: Adult Health

## 2019-08-08 DIAGNOSIS — R279 Unspecified lack of coordination: Secondary | ICD-10-CM | POA: Diagnosis not present

## 2019-08-08 DIAGNOSIS — M159 Polyosteoarthritis, unspecified: Secondary | ICD-10-CM | POA: Diagnosis not present

## 2019-08-08 DIAGNOSIS — R262 Difficulty in walking, not elsewhere classified: Secondary | ICD-10-CM | POA: Diagnosis not present

## 2019-08-08 DIAGNOSIS — Z9181 History of falling: Secondary | ICD-10-CM | POA: Diagnosis not present

## 2019-08-08 MED ORDER — LORAZEPAM 0.5 MG PO TABS: 0.2500 mg | ORAL_TABLET | Freq: Two times a day (BID) | ORAL | 0 refills | Status: DC

## 2019-08-09 ENCOUNTER — Other Ambulatory Visit: Payer: Self-pay

## 2019-08-09 DIAGNOSIS — Z1159 Encounter for screening for other viral diseases: Secondary | ICD-10-CM | POA: Diagnosis not present

## 2019-08-09 DIAGNOSIS — I130 Hypertensive heart and chronic kidney disease with heart failure and stage 1 through stage 4 chronic kidney disease, or unspecified chronic kidney disease: Secondary | ICD-10-CM | POA: Diagnosis not present

## 2019-08-10 ENCOUNTER — Other Ambulatory Visit: Payer: Self-pay | Admitting: Internal Medicine

## 2019-08-10 ENCOUNTER — Other Ambulatory Visit (HOSPITAL_COMMUNITY)
Admission: RE | Admit: 2019-08-10 | Discharge: 2019-08-10 | Disposition: A | Discharge status: Home / Self Care | Payer: Medicare Other | Source: Ambulatory Visit | Attending: Internal Medicine | Admitting: Internal Medicine

## 2019-08-10 DIAGNOSIS — Z20828 Contact with and (suspected) exposure to other viral communicable diseases: Secondary | ICD-10-CM | POA: Insufficient documentation

## 2019-08-10 DIAGNOSIS — Z9189 Other specified personal risk factors, not elsewhere classified: Secondary | ICD-10-CM

## 2019-08-10 LAB — SARS CORONAVIRUS 2 (TAT 6-24 HRS): SARS Coronavirus 2: NEGATIVE

## 2019-08-12 DIAGNOSIS — R279 Unspecified lack of coordination: Secondary | ICD-10-CM | POA: Diagnosis not present

## 2019-08-12 DIAGNOSIS — Z9181 History of falling: Secondary | ICD-10-CM | POA: Diagnosis not present

## 2019-08-12 DIAGNOSIS — M159 Polyosteoarthritis, unspecified: Secondary | ICD-10-CM | POA: Diagnosis not present

## 2019-08-12 DIAGNOSIS — R262 Difficulty in walking, not elsewhere classified: Secondary | ICD-10-CM | POA: Diagnosis not present

## 2019-08-14 ENCOUNTER — Other Ambulatory Visit (HOSPITAL_COMMUNITY)
Admission: RE | Admit: 2019-08-14 | Discharge: 2019-08-14 | Disposition: A | Discharge status: Home / Self Care | Payer: Medicare Other | Source: Ambulatory Visit | Attending: Internal Medicine | Admitting: Internal Medicine

## 2019-08-14 DIAGNOSIS — M159 Polyosteoarthritis, unspecified: Secondary | ICD-10-CM | POA: Diagnosis not present

## 2019-08-14 DIAGNOSIS — R279 Unspecified lack of coordination: Secondary | ICD-10-CM | POA: Diagnosis not present

## 2019-08-14 DIAGNOSIS — Z20828 Contact with and (suspected) exposure to other viral communicable diseases: Secondary | ICD-10-CM | POA: Diagnosis present

## 2019-08-14 DIAGNOSIS — R262 Difficulty in walking, not elsewhere classified: Secondary | ICD-10-CM | POA: Diagnosis not present

## 2019-08-14 DIAGNOSIS — Z9181 History of falling: Secondary | ICD-10-CM | POA: Diagnosis not present

## 2019-08-15 DIAGNOSIS — Z9181 History of falling: Secondary | ICD-10-CM | POA: Diagnosis not present

## 2019-08-15 DIAGNOSIS — M159 Polyosteoarthritis, unspecified: Secondary | ICD-10-CM | POA: Diagnosis not present

## 2019-08-15 DIAGNOSIS — R262 Difficulty in walking, not elsewhere classified: Secondary | ICD-10-CM | POA: Diagnosis not present

## 2019-08-15 DIAGNOSIS — R279 Unspecified lack of coordination: Secondary | ICD-10-CM | POA: Diagnosis not present

## 2019-08-16 DIAGNOSIS — M159 Polyosteoarthritis, unspecified: Secondary | ICD-10-CM | POA: Diagnosis not present

## 2019-08-16 DIAGNOSIS — Z9181 History of falling: Secondary | ICD-10-CM | POA: Diagnosis not present

## 2019-08-16 DIAGNOSIS — R279 Unspecified lack of coordination: Secondary | ICD-10-CM | POA: Diagnosis not present

## 2019-08-16 DIAGNOSIS — R262 Difficulty in walking, not elsewhere classified: Secondary | ICD-10-CM | POA: Diagnosis not present

## 2019-08-16 LAB — NOVEL CORONAVIRUS, NAA (HOSP ORDER, SEND-OUT TO REF LAB; TAT 18-24 HRS): SARS-CoV-2, NAA: NOT DETECTED

## 2019-08-17 DIAGNOSIS — Z9181 History of falling: Secondary | ICD-10-CM | POA: Diagnosis not present

## 2019-08-17 DIAGNOSIS — R262 Difficulty in walking, not elsewhere classified: Secondary | ICD-10-CM | POA: Diagnosis not present

## 2019-08-17 DIAGNOSIS — R279 Unspecified lack of coordination: Secondary | ICD-10-CM | POA: Diagnosis not present

## 2019-08-17 DIAGNOSIS — M159 Polyosteoarthritis, unspecified: Secondary | ICD-10-CM | POA: Diagnosis not present

## 2019-08-18 DIAGNOSIS — Z9181 History of falling: Secondary | ICD-10-CM | POA: Diagnosis not present

## 2019-08-18 DIAGNOSIS — M159 Polyosteoarthritis, unspecified: Secondary | ICD-10-CM | POA: Diagnosis not present

## 2019-08-18 DIAGNOSIS — R262 Difficulty in walking, not elsewhere classified: Secondary | ICD-10-CM | POA: Diagnosis not present

## 2019-08-18 DIAGNOSIS — R279 Unspecified lack of coordination: Secondary | ICD-10-CM | POA: Diagnosis not present

## 2019-08-19 ENCOUNTER — Other Ambulatory Visit (HOSPITAL_COMMUNITY)
Admission: RE | Admit: 2019-08-19 | Discharge: 2019-08-19 | Disposition: A | Discharge status: Home / Self Care | Payer: Medicare Other | Source: Ambulatory Visit | Attending: Internal Medicine | Admitting: Internal Medicine

## 2019-08-19 DIAGNOSIS — R279 Unspecified lack of coordination: Secondary | ICD-10-CM | POA: Diagnosis not present

## 2019-08-19 DIAGNOSIS — R262 Difficulty in walking, not elsewhere classified: Secondary | ICD-10-CM | POA: Diagnosis not present

## 2019-08-19 DIAGNOSIS — Z9181 History of falling: Secondary | ICD-10-CM | POA: Diagnosis not present

## 2019-08-19 DIAGNOSIS — Z20828 Contact with and (suspected) exposure to other viral communicable diseases: Secondary | ICD-10-CM | POA: Insufficient documentation

## 2019-08-19 DIAGNOSIS — M159 Polyosteoarthritis, unspecified: Secondary | ICD-10-CM | POA: Diagnosis not present

## 2019-08-21 ENCOUNTER — Encounter: Payer: Self-pay | Admitting: Adult Health

## 2019-08-21 ENCOUNTER — Non-Acute Institutional Stay (SKILLED_NURSING_FACILITY): Payer: Medicare Other | Admitting: Adult Health

## 2019-08-21 DIAGNOSIS — I5032 Chronic diastolic (congestive) heart failure: Secondary | ICD-10-CM | POA: Diagnosis not present

## 2019-08-21 DIAGNOSIS — J9611 Chronic respiratory failure with hypoxia: Secondary | ICD-10-CM

## 2019-08-21 DIAGNOSIS — I130 Hypertensive heart and chronic kidney disease with heart failure and stage 1 through stage 4 chronic kidney disease, or unspecified chronic kidney disease: Secondary | ICD-10-CM

## 2019-08-21 DIAGNOSIS — F0151 Vascular dementia with behavioral disturbance: Secondary | ICD-10-CM | POA: Diagnosis not present

## 2019-08-21 DIAGNOSIS — N1832 Chronic kidney disease, stage 3b: Secondary | ICD-10-CM

## 2019-08-21 DIAGNOSIS — Z9981 Dependence on supplemental oxygen: Secondary | ICD-10-CM

## 2019-08-21 DIAGNOSIS — F01518 Vascular dementia, unspecified severity, with other behavioral disturbance: Secondary | ICD-10-CM

## 2019-08-21 LAB — NOVEL CORONAVIRUS, NAA (HOSP ORDER, SEND-OUT TO REF LAB; TAT 18-24 HRS): SARS-CoV-2, NAA: NOT DETECTED

## 2019-08-21 NOTE — Progress Notes (Signed)
Location:    [place] Room Number: 122/D Place of Service:  SNF (31)   CODE STATUS: DNR  Allergies  Allergen Reactions  . Bee Venom Anaphylaxis  . Ace Inhibitors   . Angiotensin Receptor Blockers     Tongue swelling.  . Aspirin     Directed by physician  . Cabbage Other (See Comments)    Certain foods due to gout flares  . Latex Rash    Chief Complaint  Patient presents with  . Acute Visit     COVID Vaccine Permission    HPI:  We have the mederna vaccine available. Her risk factors include: advanced age; long term resident of snf; hypertension; chronic respiraotry failure; dementia. I have spoken with her family the 2 injection process; effectiveness; side effects; and possible adverse reactions. Verbalized understanding and agreed to the vaccine. She denies any cough; no shortness of breath no changes in appetite no uncontrolled pain.    Past Medical History:  Diagnosis Date  . Anxiety   . Cellulitis 2016  . CHF (congestive heart failure) ([place])   . Chronic kidney disease   . Dementia ([place])   . Dysphagia   . Gait abnormality   . GERD (gastroesophageal reflux disease)   . Glaucoma   . Gout   . Gout, unspecified 01/12/2010   Qualifier: Diagnosis of  By: Moshe Cipro MD, Joycelyn Schmid    . Hyperlipidemia   . Hypertension   . Muscle weakness   . Myocardial infarction [facility] [place]  . Osteoarthritis   . Syncope     Past Surgical History:  Procedure Laterality Date  . APPENDECTOMY    . CATARACT EXTRACTION W/PHACO  08/04/2011   Procedure: CATARACT EXTRACTION PHACO AND INTRAOCULAR LENS PLACEMENT (IOC);  Surgeon: Tonny Branch;  Location: [facility];  Service: Ophthalmology;  Laterality: Right;  CDE=18.53  . CATARACT EXTRACTION W/PHACO  08/25/2011   Procedure: CATARACT EXTRACTION PHACO AND INTRAOCULAR LENS PLACEMENT (IOC);  Surgeon: Tonny Branch;  Location: [facility];  Service: Ophthalmology;  Laterality: Left;  CDE:14.76  . TONSILLECTOMY      Social  History   Socioeconomic History  . Marital status: Divorced    Spouse name: Not on file  . Number of children: Not on file  . Years of education: Not on file  . Highest education level: Not on file  Occupational History  . Occupation: retired   Tobacco Use  . Smoking status: Never Smoker  . Smokeless tobacco: Never Used  Substance and Sexual Activity  . Alcohol use: No  . Drug use: No  . Sexual activity: Not Currently  Other Topics Concern  . Not on file  Social History Narrative   Long term resident [facility]    Social Determinants of Health   Financial Resource Strain:   . Difficulty of Paying Living Expenses: Not on file  Food Insecurity:   . Worried About Charity fundraiser in the Last Year: Not on file  . Ran Out of Food in the Last Year: Not on file  Transportation Needs:   . Lack of Transportation (Medical): Not on file  . Lack of Transportation (Non-Medical): Not on file  Physical Activity:   . Days of Exercise per Week: Not on file  . Minutes of Exercise per Session: Not on file  Stress:   . Feeling of Stress : Not on file  Social Connections:   . Frequency of Communication with Friends and Family: Not on file  .  Frequency of Social Gatherings with Friends and Family: Not on file  . Attends Religious Services: Not on file  . Active Member of Clubs or Organizations: Not on file  . Attends Archivist Meetings: Not on file  . Marital Status: Not on file  Intimate Partner Violence:   . Fear of Current or Ex-Partner: Not on file  . Emotionally Abused: Not on file  . Physically Abused: Not on file  . Sexually Abused: Not on file   Family History  Problem Relation Age of Onset  . Diabetes Father   . Diabetes Sister   . Diabetes Brother   . Stroke Sister   . Stroke Sister   . Anesthesia problems Neg Hx   . Hypotension Neg Hx   . Malignant hyperthermia Neg Hx   . Pseudochol deficiency Neg Hx       VITAL SIGNS BP (!) 180/62   Pulse 66   Temp  98.8 F (37.1 C) (Oral)   Resp 18   Ht 4\' 11"  (1.499 m)   Wt 163 lb 12.8 oz (74.3 kg)   SpO2 98%   BMI 33.08 kg/m   Outpatient Encounter Medications as of 08/21/2019  Medication Sig  . acetaminophen (TYLENOL) 500 MG tablet Take 1,000 mg by mouth 2 (two) times daily. For leg pain and arthritis. May have additional 1000 mg for break through pain once a day prn  . alum & mag hydroxide-simeth (MAALOX PLUS) 400-400-40 MG/5ML suspension Take by mouth. Give 4 teaspoons twice a day as nedded  . ascorbic acid (VITAMIN C) 500 MG tablet Take 500 mg by mouth daily.  Roseanne Kaufman Peru-Castor Oil (VENELEX) OINT Apply topically to sacrum and bilateral buttocks every shift and as needed  . Brinzolamide-Brimonidine (SIMBRINZA) 1-0.2 % SUSP One  drop into both eyes three times a day   . Calcium Carbonate-Vitamin D (CALCIUM-VITAMIN D) 500-200 MG-UNIT tablet Take 1 tablet by mouth 2 (two) times daily.  . citalopram (CELEXA) 20 MG tablet Take 10 mg by mouth daily.   . cycloSPORINE (RESTASIS) 0.05 % ophthalmic emulsion Place 1 drop into both eyes 2 (two) times daily.   Marland Kitchen docusate sodium (COLACE) 100 MG capsule Take 100 mg by mouth 2 (two) times daily.  Marland Kitchen donepezil (ARICEPT) 5 MG tablet Take 5 mg by mouth daily. For dementia  . hydrALAZINE (APRESOLINE) 50 MG tablet Take 50 mg by mouth 2 (two) times a day.   . hydrochlorothiazide (HYDRODIURIL) 12.5 MG tablet Take 12.5 mg by mouth daily.   Marland Kitchen ipratropium-albuterol (DUONEB) 0.5-2.5 (3) MG/3ML SOLN Take 3 mLs by nebulization every 6 (six) hours as needed.  . loratadine (CLARITIN) 10 MG tablet Take 10 mg by mouth daily.  Marland Kitchen LORazepam (ATIVAN) 0.5 MG tablet Take 0.5 tablets (0.25 mg total) by mouth 2 (two) times daily.  . nitroGLYCERIN (NITROSTAT) 0.4 MG SL tablet Place 0.4 mg under the tongue every 5 (five) minutes as needed for chest pain.   . NON FORMULARY Diet Type:  Regular, NAS  . OXYGEN Inhale 2 L/min into the lungs continuous. Wean to keep sats above 90%  .  pantoprazole (PROTONIX) 40 MG tablet Take 40 mg by mouth daily. Do not crush.   . potassium chloride (K-DUR,KLOR-CON) 10 MEQ tablet Take 10 mEq by mouth daily.  . Saline (OCEAN NASAL SPRAY NA) Place 1 spray into the nose every 2 (two) hours as needed.  . Travoprost, BAK Free, (TRAVATAN) 0.004 % SOLN ophthalmic solution Place 1 drop into both eyes  at bedtime. For glaucoma  . zinc sulfate 220 (50 Zn) MG capsule Take 220 mg by mouth daily.   No facility-administered encounter medications on file as of 08/21/2019.     SIGNIFICANT DIAGNOSTIC EXAMS   PREVIOUS ;   07-22-19: right forearm x-ray  1. Impacted fracture of the radial neck junction. 2. Suspect additional nondisplaced fracture of the coronoid process. 3. Right oval swelling and trace joint effusion. 4. Degenerative arthrosis of the wrist and elbow. Enthesopathic changes of the olecranon.  07-22-19: right shoulder x-ray  1. The osseous structures appear diffusely demineralized which may limit detection of small or nondisplaced fractures. 2. No acute fracture or traumatic malalignment. 3. High-riding appearance of the humeral head likely reflecting rotator cuff insufficiency. 4. Moderate osteoarthrosis of the acromioclavicular and glenohumeral joints.   NO NEW EXAMS.   LABS REVIEWED PREVIOUS:   09-04-18: wbc 4.5; hgb 10.5; hc6 34.8; mcv 85.1 plt 188 glucose 93; bun 27; creat 1.09; k+ 3.6; na++ 139; ca 9.7  03-11-19: wbc 4.7; hgb 11.4; hct 36.7; mcv 83.4; plt 83.4; plt 211; glucose 87; bun 44; creat 1.32; k+ 3.8; na++ 139; ca 9.4; liver normal albumin 4.0   NO NEW LABS.    Review of Systems  Constitutional: Negative for malaise/fatigue.  Respiratory: Negative for cough and shortness of breath.   Cardiovascular: Negative for chest pain, palpitations and leg swelling.  Gastrointestinal: Negative for abdominal pain, constipation and heartburn.  Musculoskeletal: Negative for back pain, joint pain and myalgias.  Skin: Negative.     Neurological: Negative for dizziness.  Psychiatric/Behavioral: The patient is not nervous/anxious.    Physical Exam Constitutional:      General: She is not in acute distress.    Appearance: She is well-developed. She is obese. She is not diaphoretic.  Neck:     Thyroid: No thyromegaly.  Cardiovascular:     Rate and Rhythm: Normal rate and regular rhythm.     Pulses: Normal pulses.     Heart sounds: Normal heart sounds.  Pulmonary:     Effort: Pulmonary effort is normal. No respiratory distress.     Breath sounds: Normal breath sounds.  Abdominal:     General: Bowel sounds are normal. There is no distension.     Palpations: Abdomen is soft.     Tenderness: There is no abdominal tenderness.  Musculoskeletal:        General: Normal range of motion.     Cervical back: Neck supple.     Right lower leg: No edema.     Left lower leg: No edema.  Lymphadenopathy:     Cervical: No cervical adenopathy.  Skin:    General: Skin is warm and dry.  Neurological:     Mental Status: She is alert. Mental status is at baseline.  Psychiatric:        Mood and Affect: Mood normal.        ASSESSMENT/ PLAN:  TODAY  1. advanced age 85. Long term resident of snf 3. Chronic respiratory failure with hypoxia  4. Hypertensive heart and kidney disease with chronic diastolic congestive heart failure and stage 3b chronic kidney disease 5. Vascular dementia with behavioral disturbance  Will setup for her first injection on 08-27-19.   MD is aware of resident's narcotic use and is in agreement with current plan of care. We will attempt to wean resident as appropriate.  Ok Edwards NP [facility]  Contact [phone] Monday through Friday 8am- 5pm  After hours call [phone]

## 2019-08-22 ENCOUNTER — Other Ambulatory Visit: Payer: Self-pay | Admitting: Adult Health

## 2019-08-25 ENCOUNTER — Other Ambulatory Visit (HOSPITAL_COMMUNITY)
Admission: RE | Admit: 2019-08-25 | Discharge: 2019-08-25 | Disposition: A | Discharge status: Home / Self Care | Payer: Medicare Other | Source: Ambulatory Visit | Attending: Adult Health | Admitting: Adult Health

## 2019-08-25 DIAGNOSIS — Z20828 Contact with and (suspected) exposure to other viral communicable diseases: Secondary | ICD-10-CM | POA: Insufficient documentation

## 2019-08-26 LAB — NOVEL CORONAVIRUS, NAA (HOSP ORDER, SEND-OUT TO REF LAB; TAT 18-24 HRS): SARS-CoV-2, NAA: NOT DETECTED

## 2019-08-29 ENCOUNTER — Non-Acute Institutional Stay (SKILLED_NURSING_FACILITY): Payer: Medicare Other | Admitting: Adult Health

## 2019-08-29 ENCOUNTER — Other Ambulatory Visit: Payer: Self-pay | Admitting: Adult Health

## 2019-08-29 ENCOUNTER — Encounter: Payer: Self-pay | Admitting: Adult Health

## 2019-08-29 DIAGNOSIS — F418 Other specified anxiety disorders: Secondary | ICD-10-CM | POA: Diagnosis not present

## 2019-08-29 MED ORDER — LORAZEPAM 0.5 MG PO TABS: 0.2500 mg | ORAL_TABLET | Freq: Every day | ORAL | 0 refills | Status: DC

## 2019-08-29 NOTE — Progress Notes (Signed)
Location:    [place] Room Number: 122/D Place of Service:  SNF (31)   CODE STATUS: DNR  Allergies  Allergen Reactions  . Bee Venom Anaphylaxis  . Ace Inhibitors   . Angiotensin Receptor Blockers     Tongue swelling.  . Aspirin     Directed by physician  . Cabbage Other (See Comments)    Certain foods due to gout flares  . Latex Rash    Chief Complaint  Patient presents with  . Acute Visit    Med Management    HPI:  She is presently taking ativan 0.25 mg twice daily . There are no reports of agitation or anxiety. She states that she is feeling good and has no concerns. There are no reports of changes in appetite.    Past Medical History:  Diagnosis Date  . Anxiety   . Cellulitis 2016  . CHF (congestive heart failure) ([place])   . Chronic kidney disease   . Dementia ([place])   . Dysphagia   . Gait abnormality   . GERD (gastroesophageal reflux disease)   . Glaucoma   . Gout   . Gout, unspecified 01/12/2010   Qualifier: Diagnosis of  By: Moshe Cipro MD, Joycelyn Schmid    . Hyperlipidemia   . Hypertension   . Muscle weakness   . Myocardial infarction [facility])   . Osteoarthritis   . Syncope     Past Surgical History:  Procedure Laterality Date  . APPENDECTOMY    . CATARACT EXTRACTION W/PHACO  08/04/2011   Procedure: CATARACT EXTRACTION PHACO AND INTRAOCULAR LENS PLACEMENT (IOC);  Surgeon: Tonny Branch;  Location: [facility];  Service: Ophthalmology;  Laterality: Right;  CDE=18.53  . CATARACT EXTRACTION W/PHACO  08/25/2011   Procedure: CATARACT EXTRACTION PHACO AND INTRAOCULAR LENS PLACEMENT (IOC);  Surgeon: Tonny Branch;  Location: [facility];  Service: Ophthalmology;  Laterality: Left;  CDE:14.76  . TONSILLECTOMY      Social History   Socioeconomic History  . Marital status: Divorced    Spouse name: Not on file  . Number of children: Not on file  . Years of education: Not on file  . Highest education level: Not on file  Occupational History   . Occupation: retired   Tobacco Use  . Smoking status: Never Smoker  . Smokeless tobacco: Never Used  Substance and Sexual Activity  . Alcohol use: No  . Drug use: No  . Sexual activity: Not Currently  Other Topics Concern  . Not on file  Social History Narrative   Long term resident [facility]    Social Determinants of Health   Financial Resource Strain:   . Difficulty of Paying Living Expenses: Not on file  Food Insecurity:   . Worried About Charity fundraiser in the Last Year: Not on file  . Ran Out of Food in the Last Year: Not on file  Transportation Needs:   . Lack of Transportation (Medical): Not on file  . Lack of Transportation (Non-Medical): Not on file  Physical Activity:   . Days of Exercise per Week: Not on file  . Minutes of Exercise per Session: Not on file  Stress:   . Feeling of Stress : Not on file  Social Connections:   . Frequency of Communication with Friends and Family: Not on file  . Frequency of Social Gatherings with Friends and Family: Not on file  . Attends Religious Services: Not on file  . Active Member of Clubs  or Organizations: Not on file  . Attends Archivist Meetings: Not on file  . Marital Status: Not on file  Intimate Partner Violence:   . Fear of Current or Ex-Partner: Not on file  . Emotionally Abused: Not on file  . Physically Abused: Not on file  . Sexually Abused: Not on file   Family History  Problem Relation Age of Onset  . Diabetes Father   . Diabetes Sister   . Diabetes Brother   . Stroke Sister   . Stroke Sister   . Anesthesia problems Neg Hx   . Hypotension Neg Hx   . Malignant hyperthermia Neg Hx   . Pseudochol deficiency Neg Hx       VITAL SIGNS BP 139/74   Pulse 71   Temp 98.6 F (37 C) (Oral)   Resp 19   Ht 4\' 11"  (1.499 m)   Wt 163 lb 12.8 oz (74.3 kg)   SpO2 97%   BMI 33.08 kg/m   Outpatient Encounter Medications as of 08/29/2019  Medication Sig  . acetaminophen (TYLENOL) 500 MG tablet  Take 1,000 mg by mouth 2 (two) times daily. For leg pain and arthritis. May have additional 1000 mg for break through pain once a day prn  . Alum & Mag Hydroxide-Simeth (MYLANTA MAXIMUM STRENGTH PO) Take by mouth. Suspension; 400-400-40 mg/5 ml; amt 4 teaspoons; For indigestion/ heartburn twice a day prn  . Balsam [place]-Castor Oil (VENELEX) OINT Apply topically to sacrum and bilateral buttocks every shift and as needed  . Brinzolamide-Brimonidine (SIMBRINZA) 1-0.2 % SUSP One  drop into both eyes three times a day   . Calcium Carbonate-Vitamin D (CALCIUM-VITAMIN D) 500-200 MG-UNIT tablet Take 1 tablet by mouth 2 (two) times daily.  . citalopram (CELEXA) 20 MG tablet Take 10 mg by mouth daily.   . cycloSPORINE (RESTASIS) 0.05 % ophthalmic emulsion Place 1 drop into both eyes 2 (two) times daily.   Marland Kitchen docusate sodium (COLACE) 100 MG capsule Take 100 mg by mouth 2 (two) times daily.  Marland Kitchen donepezil (ARICEPT) 5 MG tablet Take 5 mg by mouth daily. For dementia  . hydrALAZINE (APRESOLINE) 50 MG tablet Take 50 mg by mouth 2 (two) times a day.   . hydrochlorothiazide (HYDRODIURIL) 12.5 MG tablet Take 12.5 mg by mouth daily.   Marland Kitchen ipratropium-albuterol (DUONEB) 0.5-2.5 (3) MG/3ML SOLN Take 3 mLs by nebulization every 6 (six) hours as needed.  . loratadine (CLARITIN) 10 MG tablet Take 10 mg by mouth daily.  Marland Kitchen LORazepam (ATIVAN) 0.5 MG tablet Take 0.5 tablets (0.25 mg total) by mouth daily.  . nitroGLYCERIN (NITROSTAT) 0.4 MG SL tablet Place 0.4 mg under the tongue every 5 (five) minutes as needed for chest pain.   . NON FORMULARY Diet Type:  Regular, NAS  . OXYGEN Inhale 2 L/min into the lungs continuous. Wean to keep sats above 90%  . pantoprazole (PROTONIX) 40 MG tablet Take 40 mg by mouth daily. Do not crush.   . potassium chloride (K-DUR,KLOR-CON) 10 MEQ tablet Take 10 mEq by mouth daily.  . Saline (OCEAN NASAL SPRAY NA) Place 1 spray into the nose every 2 (two) hours as needed.  . Travoprost, BAK Free,  (TRAVATAN) 0.004 % SOLN ophthalmic solution Place 1 drop into both eyes at bedtime. For glaucoma  . [DISCONTINUED] alum & mag hydroxide-simeth (MAALOX PLUS) 400-400-40 MG/5ML suspension Take by mouth. Give 4 teaspoons twice a day as nedded   No facility-administered encounter medications on file as of 08/29/2019.  SIGNIFICANT DIAGNOSTIC EXAMS   PREVIOUS ;   07-22-19: right forearm x-ray  1. Impacted fracture of the radial neck junction. 2. Suspect additional nondisplaced fracture of the coronoid process. 3. Right oval swelling and trace joint effusion. 4. Degenerative arthrosis of the wrist and elbow. Enthesopathic changes of the olecranon.  07-22-19: right shoulder x-ray  1. The osseous structures appear diffusely demineralized which may limit detection of small or nondisplaced fractures. 2. No acute fracture or traumatic malalignment. 3. High-riding appearance of the humeral head likely reflecting rotator cuff insufficiency. 4. Moderate osteoarthrosis of the acromioclavicular and glenohumeral joints.   NO NEW EXAMS.   LABS REVIEWED PREVIOUS:   09-04-18: wbc 4.5; hgb 10.5; hc6 34.8; mcv 85.1 plt 188 glucose 93; bun 27; creat 1.09; k+ 3.6; na++ 139; ca 9.7  03-11-19: wbc 4.7; hgb 11.4; hct 36.7; mcv 83.4; plt 83.4; plt 211; glucose 87; bun 44; creat 1.32; k+ 3.8; na++ 139; ca 9.4; liver normal albumin 4.0   NO NEW LABS.    Review of Systems  Constitutional: Negative for malaise/fatigue.  Respiratory: Negative for cough and shortness of breath.   Cardiovascular: Negative for chest pain, palpitations and leg swelling.  Gastrointestinal: Negative for abdominal pain, constipation and heartburn.  Musculoskeletal: Negative for back pain, joint pain and myalgias.  Skin: Negative.   Neurological: Negative for dizziness.  Psychiatric/Behavioral: The patient is not nervous/anxious.     Physical Exam Constitutional:      General: She is not in acute distress.    Appearance: She  is well-developed. She is obese. She is not diaphoretic.  Neck:     Thyroid: No thyromegaly.  Cardiovascular:     Rate and Rhythm: Normal rate and regular rhythm.     Pulses: Normal pulses.     Heart sounds: Normal heart sounds.  Pulmonary:     Effort: Pulmonary effort is normal. No respiratory distress.     Breath sounds: Normal breath sounds.  Abdominal:     General: Bowel sounds are normal. There is no distension.     Palpations: Abdomen is soft.     Tenderness: There is no abdominal tenderness.  Musculoskeletal:        General: Normal range of motion.     Cervical back: Neck supple.     Right lower leg: No edema.     Left lower leg: No edema.  Lymphadenopathy:     Cervical: No cervical adenopathy.  Skin:    General: Skin is warm and dry.  Neurological:     Mental Status: She is alert. Mental status is at baseline.  Psychiatric:        Mood and Affect: Mood normal.       ASSESSMENT/ PLAN:  TODAY  1. Depression with anxiety: is stable will lower ativan 0.25 mg daily and will monitor her status.      MD is aware of resident's narcotic use and is in agreement with current plan of care. We will attempt to wean resident as appropriate.  Ok Edwards NP [facility]  Contact [phone] Monday through Friday 8am- 5pm  After hours call [phone]

## 2019-09-04 ENCOUNTER — Non-Acute Institutional Stay (SKILLED_NURSING_FACILITY): Payer: Medicare Other | Admitting: Adult Health

## 2019-09-04 DIAGNOSIS — I5032 Chronic diastolic (congestive) heart failure: Secondary | ICD-10-CM | POA: Diagnosis not present

## 2019-09-04 DIAGNOSIS — N1832 Chronic kidney disease, stage 3b: Secondary | ICD-10-CM

## 2019-09-04 DIAGNOSIS — M79601 Pain in right arm: Secondary | ICD-10-CM | POA: Diagnosis not present

## 2019-09-04 DIAGNOSIS — Z1159 Encounter for screening for other viral diseases: Secondary | ICD-10-CM | POA: Diagnosis not present

## 2019-09-04 DIAGNOSIS — F0151 Vascular dementia with behavioral disturbance: Secondary | ICD-10-CM

## 2019-09-04 DIAGNOSIS — F01518 Vascular dementia, unspecified severity, with other behavioral disturbance: Secondary | ICD-10-CM

## 2019-09-04 DIAGNOSIS — I130 Hypertensive heart and chronic kidney disease with heart failure and stage 1 through stage 4 chronic kidney disease, or unspecified chronic kidney disease: Secondary | ICD-10-CM | POA: Diagnosis not present

## 2019-09-05 ENCOUNTER — Encounter: Payer: Self-pay | Admitting: Adult Health

## 2019-09-05 ENCOUNTER — Non-Acute Institutional Stay (SKILLED_NURSING_FACILITY): Payer: Medicare Other | Admitting: Adult Health

## 2019-09-05 DIAGNOSIS — I5032 Chronic diastolic (congestive) heart failure: Secondary | ICD-10-CM

## 2019-09-05 DIAGNOSIS — I82402 Acute embolism and thrombosis of unspecified deep veins of left lower extremity: Secondary | ICD-10-CM

## 2019-09-05 DIAGNOSIS — F418 Other specified anxiety disorders: Secondary | ICD-10-CM | POA: Diagnosis not present

## 2019-09-05 NOTE — Progress Notes (Signed)
Location:   [facility] Room Number: [id number] Place of Service:  SNF (31)   CODE STATUS: dnr  Allergies  Allergen Reactions  . Bee Venom Anaphylaxis  . Ace Inhibitors   . Angiotensin Receptor Blockers     Tongue swelling.  . Aspirin     Directed by physician  . Cabbage Other (See Comments)    Certain foods due to gout flares  . Latex Rash    Chief Complaint  Patient presents with  . Acute Visit    care plan meeting     HPI:  We have come together for her care plan meeting. BIMS 15/15 mood 0/30. She has family present. She has had one fall with fracture. She denies any pain; she has lost 10 pounds over the past year. She states that her appetite is good. There are no reports of anxiety; she has had her ativan lowered to one time daily. She continues to be followed for her chronic illnesses including: chf; dementia ckd.   Past Medical History:  Diagnosis Date  . Anxiety   . Cellulitis 2016  . CHF (congestive heart failure) ([place])   . Chronic kidney disease   . Dementia ([place])   . Dysphagia   . Gait abnormality   . GERD (gastroesophageal reflux disease)   . Glaucoma   . Gout   . Gout, unspecified 01/12/2010   Qualifier: Diagnosis of  By: Moshe Cipro MD, Joycelyn Schmid    . Hyperlipidemia   . Hypertension   . Muscle weakness   . Myocardial infarction [facility] [place]  . Osteoarthritis   . Syncope     Past Surgical History:  Procedure Laterality Date  . APPENDECTOMY    . CATARACT EXTRACTION W/PHACO  08/04/2011   Procedure: CATARACT EXTRACTION PHACO AND INTRAOCULAR LENS PLACEMENT (IOC);  Surgeon: Tonny Branch;  Location: [facility];  Service: Ophthalmology;  Laterality: Right;  CDE=18.53  . CATARACT EXTRACTION W/PHACO  08/25/2011   Procedure: CATARACT EXTRACTION PHACO AND INTRAOCULAR LENS PLACEMENT (IOC);  Surgeon: Tonny Branch;  Location: [facility];  Service: Ophthalmology;  Laterality: Left;  CDE:14.76  . TONSILLECTOMY      Social History   Socioeconomic  History  . Marital status: Divorced    Spouse name: Not on file  . Number of children: Not on file  . Years of education: Not on file  . Highest education level: Not on file  Occupational History  . Occupation: retired   Tobacco Use  . Smoking status: Never Smoker  . Smokeless tobacco: Never Used  Substance and Sexual Activity  . Alcohol use: No  . Drug use: No  . Sexual activity: Not Currently  Other Topics Concern  . Not on file  Social History Narrative   Long term resident [facility]    Social Determinants of Health   Financial Resource Strain:   . Difficulty of Paying Living Expenses: Not on file  Food Insecurity:   . Worried About Charity fundraiser in the Last Year: Not on file  . Ran Out of Food in the Last Year: Not on file  Transportation Needs:   . Lack of Transportation (Medical): Not on file  . Lack of Transportation (Non-Medical): Not on file  Physical Activity:   . Days of Exercise per Week: Not on file  . Minutes of Exercise per Session: Not on file  Stress:   . Feeling of Stress : Not on file  Social Connections:   .  Frequency of Communication with Friends and Family: Not on file  . Frequency of Social Gatherings with Friends and Family: Not on file  . Attends Religious Services: Not on file  . Active Member of Clubs or Organizations: Not on file  . Attends Archivist Meetings: Not on file  . Marital Status: Not on file  Intimate Partner Violence:   . Fear of Current or Ex-Partner: Not on file  . Emotionally Abused: Not on file  . Physically Abused: Not on file  . Sexually Abused: Not on file   Family History  Problem Relation Age of Onset  . Diabetes Father   . Diabetes Sister   . Diabetes Brother   . Stroke Sister   . Stroke Sister   . Anesthesia problems Neg Hx   . Hypotension Neg Hx   . Malignant hyperthermia Neg Hx   . Pseudochol deficiency Neg Hx       VITAL SIGNS BP 138/80   Pulse 68   Temp 98.1 F (36.7 C)   Resp 16    Ht 4\' 11"  (1.499 m)   Wt 167 lb (75.8 kg)   SpO2 98%   BMI 33.73 kg/m   Outpatient Encounter Medications as of 09/04/2019  Medication Sig  . acetaminophen (TYLENOL) 500 MG tablet Take 1,000 mg by mouth 2 (two) times daily. For leg pain and arthritis. May have additional 1000 mg for break through pain once a day prn  . Alum & Mag Hydroxide-Simeth (MYLANTA MAXIMUM STRENGTH PO) Take by mouth. Suspension; 400-400-40 mg/5 ml; amt 4 teaspoons; For indigestion/ heartburn twice a day prn  . Balsam [place]-Castor Oil (VENELEX) OINT Apply topically to sacrum and bilateral buttocks every shift and as needed  . Brinzolamide-Brimonidine (SIMBRINZA) 1-0.2 % SUSP One  drop into both eyes three times a day   . Calcium Carbonate-Vitamin D (CALCIUM-VITAMIN D) 500-200 MG-UNIT tablet Take 1 tablet by mouth 2 (two) times daily.  . citalopram (CELEXA) 20 MG tablet Take 10 mg by mouth daily.   . cycloSPORINE (RESTASIS) 0.05 % ophthalmic emulsion Place 1 drop into both eyes 2 (two) times daily.   Marland Kitchen docusate sodium (COLACE) 100 MG capsule Take 100 mg by mouth 2 (two) times daily.  Marland Kitchen donepezil (ARICEPT) 5 MG tablet Take 5 mg by mouth daily. For dementia  . hydrALAZINE (APRESOLINE) 50 MG tablet Take 50 mg by mouth 2 (two) times a day.   . hydrochlorothiazide (HYDRODIURIL) 12.5 MG tablet Take 12.5 mg by mouth daily.   Marland Kitchen ipratropium-albuterol (DUONEB) 0.5-2.5 (3) MG/3ML SOLN Take 3 mLs by nebulization every 6 (six) hours as needed.  . loratadine (CLARITIN) 10 MG tablet Take 10 mg by mouth daily.  Marland Kitchen LORazepam (ATIVAN) 0.5 MG tablet Take 0.5 tablets (0.25 mg total) by mouth daily.  . nitroGLYCERIN (NITROSTAT) 0.4 MG SL tablet Place 0.4 mg under the tongue every 5 (five) minutes as needed for chest pain.   . NON FORMULARY Diet Type:  Regular, NAS  . OXYGEN Inhale 2 L/min into the lungs continuous. Wean to keep sats above 90%  . pantoprazole (PROTONIX) 40 MG tablet Take 40 mg by mouth daily. Do not crush.   . potassium chloride  (K-DUR,KLOR-CON) 10 MEQ tablet Take 10 mEq by mouth daily.  . Saline (OCEAN NASAL SPRAY NA) Place 1 spray into the nose every 2 (two) hours as needed.  . Travoprost, BAK Free, (TRAVATAN) 0.004 % SOLN ophthalmic solution Place 1 drop into both eyes at bedtime. For glaucoma  No facility-administered encounter medications on file as of 09/04/2019.     SIGNIFICANT DIAGNOSTIC EXAMS   PREVIOUS ;   07-22-19: right forearm x-ray  1. Impacted fracture of the radial neck junction. 2. Suspect additional nondisplaced fracture of the coronoid process. 3. Right oval swelling and trace joint effusion. 4. Degenerative arthrosis of the wrist and elbow. Enthesopathic changes of the olecranon.  07-22-19: right shoulder x-ray  1. The osseous structures appear diffusely demineralized which may limit detection of small or nondisplaced fractures. 2. No acute fracture or traumatic malalignment. 3. High-riding appearance of the humeral head likely reflecting rotator cuff insufficiency. 4. Moderate osteoarthrosis of the acromioclavicular and glenohumeral joints.   NO NEW EXAMS.   LABS REVIEWED PREVIOUS:   09-04-18: wbc 4.5; hgb 10.5; hc6 34.8; mcv 85.1 plt 188 glucose 93; bun 27; creat 1.09; k+ 3.6; na++ 139; ca 9.7  03-11-19: wbc 4.7; hgb 11.4; hct 36.7; mcv 83.4; plt 83.4; plt 211; glucose 87; bun 44; creat 1.32; k+ 3.8; na++ 139; ca 9.4; liver normal albumin 4.0   NO NEW LABS.    Review of Systems  Constitutional: Negative for malaise/fatigue.  Respiratory: Negative for cough and shortness of breath.   Cardiovascular: Negative for chest pain, palpitations and leg swelling.  Gastrointestinal: Negative for abdominal pain, constipation and heartburn.  Musculoskeletal: Negative for back pain, joint pain and myalgias.  Skin: Negative.   Neurological: Negative for dizziness.  Psychiatric/Behavioral: The patient is not nervous/anxious.     Physical Exam Constitutional:      General: She is not in acute  distress.    Appearance: She is well-developed. She is obese. She is not diaphoretic.  Neck:     Thyroid: No thyromegaly.  Cardiovascular:     Rate and Rhythm: Normal rate and regular rhythm.     Pulses: Normal pulses.     Heart sounds: Normal heart sounds.  Pulmonary:     Effort: Pulmonary effort is normal. No respiratory distress.     Breath sounds: Normal breath sounds.  Abdominal:     General: Bowel sounds are normal. There is no distension.     Palpations: Abdomen is soft.     Tenderness: There is no abdominal tenderness.  Musculoskeletal:        General: Normal range of motion.     Cervical back: Neck supple.     Right lower leg: No edema.     Left lower leg: No edema.  Lymphadenopathy:     Cervical: No cervical adenopathy.  Skin:    General: Skin is warm and dry.  Neurological:     Mental Status: She is alert. Mental status is at baseline.  Psychiatric:        Mood and Affect: Mood normal.        ASSESSMENT/ PLAN:  TODAY  1. Chronic diastolic CHF (congestive heart failure)  2. Vascular dementia with behavioral disturbance 3. ckd stage 3b.   Will continue current medications Will continue current plan of care Will continue to monitor her status.     MD is aware of resident's narcotic use and is in agreement with current plan of care. We will attempt to wean resident as appropriate.  Ok Edwards NP [facility] Adult [facility]  Contact [phone] Monday through Friday 8am- 5pm  After hours call [phone]

## 2019-09-05 NOTE — Progress Notes (Signed)
Location:    [place] Room Number: 122/D Place of Service:  SNF (31)   CODE STATUS: DNR  Allergies  Allergen Reactions  . Bee Venom Anaphylaxis  . Ace Inhibitors   . Angiotensin Receptor Blockers     Tongue swelling.  . Aspirin     Directed by physician  . Cabbage Other (See Comments)    Certain foods due to gout flares  . Latex Rash    Chief Complaint  Patient presents with  . Medical Management of Chronic Issues        Depression with anxiety:  Chronic diastolic CHF (congestive heart failure) Deep vein thrombosis (DVT) of left lower extremity unspecified chronicity unspecified vein:    HPI:  She is a 86 year old long term resident of this facility being seen for the management of her chronic illnesses: dvt; chf depression. She denies any anxiety or depressive thoughts. She denies any cough or shortness of breath. Denies any uncontrolled pain.    Past Medical History:  Diagnosis Date  . Anxiety   . Cellulitis 2016  . CHF (congestive heart failure) ([place])   . Chronic kidney disease   . Dementia ([place])   . Dysphagia   . Gait abnormality   . GERD (gastroesophageal reflux disease)   . Glaucoma   . Gout   . Gout, unspecified 01/12/2010   Qualifier: Diagnosis of  By: Moshe Cipro MD, Joycelyn Schmid    . Hyperlipidemia   . Hypertension   . Muscle weakness   . Myocardial infarction [facility]) [place]  . Osteoarthritis   . Syncope     Past Surgical History:  Procedure Laterality Date  . APPENDECTOMY    . CATARACT EXTRACTION W/PHACO  08/04/2011   Procedure: CATARACT EXTRACTION PHACO AND INTRAOCULAR LENS PLACEMENT (IOC);  Surgeon: Tonny Branch;  Location: [facility];  Service: Ophthalmology;  Laterality: Right;  CDE=18.53  . CATARACT EXTRACTION W/PHACO  08/25/2011   Procedure: CATARACT EXTRACTION PHACO AND INTRAOCULAR LENS PLACEMENT (IOC);  Surgeon: Tonny Branch;  Location: [facility];  Service: Ophthalmology;  Laterality: Left;  CDE:14.76  . TONSILLECTOMY       Social History   Socioeconomic History  . Marital status: Divorced    Spouse name: Not on file  . Number of children: Not on file  . Years of education: Not on file  . Highest education level: Not on file  Occupational History  . Occupation: retired   Tobacco Use  . Smoking status: Never Smoker  . Smokeless tobacco: Never Used  Substance and Sexual Activity  . Alcohol use: No  . Drug use: No  . Sexual activity: Not Currently  Other Topics Concern  . Not on file  Social History Narrative   Long term resident [facility]    Social Determinants of Health   Financial Resource Strain:   . Difficulty of Paying Living Expenses: Not on file  Food Insecurity:   . Worried About Charity fundraiser in the Last Year: Not on file  . Ran Out of Food in the Last Year: Not on file  Transportation Needs:   . Lack of Transportation (Medical): Not on file  . Lack of Transportation (Non-Medical): Not on file  Physical Activity:   . Days of Exercise per Week: Not on file  . Minutes of Exercise per Session: Not on file  Stress:   . Feeling of Stress : Not on file  Social Connections:   . Frequency of Communication with  Friends and Family: Not on file  . Frequency of Social Gatherings with Friends and Family: Not on file  . Attends Religious Services: Not on file  . Active Member of Clubs or Organizations: Not on file  . Attends Archivist Meetings: Not on file  . Marital Status: Not on file  Intimate Partner Violence:   . Fear of Current or Ex-Partner: Not on file  . Emotionally Abused: Not on file  . Physically Abused: Not on file  . Sexually Abused: Not on file   Family History  Problem Relation Age of Onset  . Diabetes Father   . Diabetes Sister   . Diabetes Brother   . Stroke Sister   . Stroke Sister   . Anesthesia problems Neg Hx   . Hypotension Neg Hx   . Malignant hyperthermia Neg Hx   . Pseudochol deficiency Neg Hx       VITAL SIGNS BP (!) 163/71   Pulse  71   Temp 98.9 F (37.2 C) (Oral)   Resp 18   Ht 4\' 11"  (1.499 m)   Wt 167 lb (75.8 kg)   SpO2 96%   BMI 33.73 kg/m   Outpatient Encounter Medications as of 09/05/2019  Medication Sig  . acetaminophen (TYLENOL) 500 MG tablet Take 1,000 mg by mouth 2 (two) times daily. For leg pain and arthritis. May have additional 1000 mg for break through pain once a day prn  . Alum & Mag Hydroxide-Simeth (MYLANTA MAXIMUM STRENGTH PO) Take by mouth. Suspension; 400-400-40 mg/5 ml; amt 4 teaspoons; For indigestion/ heartburn twice a day prn  . Balsam [place]-Castor Oil (VENELEX) OINT Apply topically to sacrum and bilateral buttocks every shift and as needed  . Brinzolamide-Brimonidine (SIMBRINZA) 1-0.2 % SUSP One  drop into both eyes three times a day   . Calcium Carbonate-Vitamin D (CALCIUM-VITAMIN D) 500-200 MG-UNIT tablet Take 1 tablet by mouth 2 (two) times daily.  . citalopram (CELEXA) 20 MG tablet Take 10 mg by mouth daily.   . cycloSPORINE (RESTASIS) 0.05 % ophthalmic emulsion Place 1 drop into both eyes 2 (two) times daily.   Marland Kitchen docusate sodium (COLACE) 100 MG capsule Take 100 mg by mouth 2 (two) times daily.  Marland Kitchen donepezil (ARICEPT) 5 MG tablet Take 5 mg by mouth daily. For dementia  . hydrALAZINE (APRESOLINE) 50 MG tablet Take 50 mg by mouth 2 (two) times a day.   . hydrochlorothiazide (HYDRODIURIL) 12.5 MG tablet Take 12.5 mg by mouth daily.   Marland Kitchen ipratropium-albuterol (DUONEB) 0.5-2.5 (3) MG/3ML SOLN Take 3 mLs by nebulization every 6 (six) hours as needed.  . loratadine (CLARITIN) 10 MG tablet Take 10 mg by mouth daily.  Marland Kitchen LORazepam (ATIVAN) 0.5 MG tablet Take 0.5 tablets (0.25 mg total) by mouth daily.  . nitroGLYCERIN (NITROSTAT) 0.4 MG SL tablet Place 0.4 mg under the tongue every 5 (five) minutes as needed for chest pain.   . NON FORMULARY Diet Type:  Regular, NAS  . OXYGEN Inhale 2 L/min into the lungs continuous. Wean to keep sats above 90%  . pantoprazole (PROTONIX) 40 MG tablet Take 40 mg by  mouth daily. Do not crush.   . potassium chloride (K-DUR,KLOR-CON) 10 MEQ tablet Take 10 mEq by mouth daily.  . Saline (OCEAN NASAL SPRAY NA) Place 1 spray into the nose every 2 (two) hours as needed.  . Travoprost, BAK Free, (TRAVATAN) 0.004 % SOLN ophthalmic solution Place 1 drop into both eyes at bedtime. For glaucoma   No  facility-administered encounter medications on file as of 09/05/2019.     SIGNIFICANT DIAGNOSTIC EXAMS  PREVIOUS ;   07-22-19: right forearm x-ray  1. Impacted fracture of the radial neck junction. 2. Suspect additional nondisplaced fracture of the coronoid process. 3. Right oval swelling and trace joint effusion. 4. Degenerative arthrosis of the wrist and elbow. Enthesopathic changes of the olecranon.  07-22-19: right shoulder x-ray  1. The osseous structures appear diffusely demineralized which may limit detection of small or nondisplaced fractures. 2. No acute fracture or traumatic malalignment. 3. High-riding appearance of the humeral head likely reflecting rotator cuff insufficiency. 4. Moderate osteoarthrosis of the acromioclavicular and glenohumeral joints.   NO NEW EXAMS.   LABS REVIEWED PREVIOUS:   09-04-18: wbc 4.5; hgb 10.5; hc6 34.8; mcv 85.1 plt 188 glucose 93; bun 27; creat 1.09; k+ 3.6; na++ 139; ca 9.7  03-11-19: wbc 4.7; hgb 11.4; hct 36.7; mcv 83.4; plt 83.4; plt 211; glucose 87; bun 44; creat 1.32; k+ 3.8; na++ 139; ca 9.4; liver normal albumin 4.0   NO NEW LABS.    Review of Systems  Constitutional: Negative for malaise/fatigue.  Respiratory: Negative for cough and shortness of breath.   Cardiovascular: Negative for chest pain, palpitations and leg swelling.  Gastrointestinal: Negative for abdominal pain, constipation and heartburn.  Musculoskeletal: Negative for back pain, joint pain and myalgias.  Skin: Negative.   Neurological: Negative for dizziness.  Psychiatric/Behavioral: The patient is not nervous/anxious.    Physical  Exam Constitutional:      General: She is not in acute distress.    Appearance: She is well-developed. She is obese. She is not diaphoretic.  Eyes:     Comments: History of bilateral cataracts with implants   Neck:     Thyroid: No thyromegaly.  Cardiovascular:     Rate and Rhythm: Normal rate and regular rhythm.     Heart sounds: Normal heart sounds.     Comments: Pedal pulses faint  Pulmonary:     Effort: Pulmonary effort is normal. No respiratory distress.     Breath sounds: Normal breath sounds.  Abdominal:     General: Bowel sounds are normal. There is no distension.     Palpations: Abdomen is soft.     Tenderness: There is no abdominal tenderness.  Musculoskeletal:        General: Normal range of motion.     Cervical back: Neck supple.     Right lower leg: No edema.     Left lower leg: No edema.  Lymphadenopathy:     Cervical: No cervical adenopathy.  Skin:    General: Skin is warm and dry.  Neurological:     Mental Status: She is alert. Mental status is at baseline.  Psychiatric:        Mood and Affect: Mood normal.         ASSESSMENT/ PLAN:  TODAY:   1. Depression with anxiety: is stable will continue celexa 10 mg daily and ativan 0.25 mg daily   2. Chronic diastolic CHF (congestive heart failure) EF 65-70% (03-04-18) will continue apresoline 50 mg twice daily and has prn ntg   3. Deep vein thrombosis (DVT) of left lower extremity unspecified chronicity unspecified vein: is stable will continue to monitor her status.   PREVIOUS  4. Chronic non-seasonal allergic rhinitis: is stable will continue claritin 10 mg daily   5. Hypertensive heart and kidney disease with chronic diastolic congestive heart failure and stage 3 chronic kidney disease: is stable b/p  163/71 will continue hctz 12.5 mg daily apresoline 50 mg twice daily   6. Chronic respiratory failure with hypoxia on home 02 therapy: is stable is 02 dependent will continue claritin 10 mg daily   7. GERD  without esophagitis: is stable will continue protonix 40 mg  daily   8. Increased intraocular pressure bilateral: is stable will continue simbrinza to both eyes three times daily and travatan to both eyes nightly  9. Hypokalemia: is stable continue k+ 10 meq daily  10. Vascular dementia with behavioral disturbance. No significant change in status: weight is 167 pounds; albumin 3.9; will continue aricept 5 mg daily. She is slowly losing weight which is an unfortunate but expected outcome at the late stages of this disease process.   11. Primary osteoarthritis multiple joints: is stable will continue tylenol 1 gm twice daily   12. CKD (chronic kidney disease) stage 3 GFR 30-59 ml/min is stable bun 27; creat 1.09  13. Chronic constipation; will continue colace twice daily      MD is aware of resident's narcotic use and is in agreement with current plan of care. We will attempt to wean resident as appropriate.  Ok Edwards NP [facility]  Contact [phone] Monday through Friday 8am- 5pm  After hours call [phone]

## 2019-09-06 ENCOUNTER — Other Ambulatory Visit (HOSPITAL_COMMUNITY)
Admission: RE | Admit: 2019-09-06 | Discharge: 2019-09-06 | Disposition: A | Discharge status: Home / Self Care | Payer: Medicare Other | Source: Skilled Nursing Facility | Attending: Adult Health | Admitting: Adult Health

## 2019-09-06 DIAGNOSIS — I130 Hypertensive heart and chronic kidney disease with heart failure and stage 1 through stage 4 chronic kidney disease, or unspecified chronic kidney disease: Secondary | ICD-10-CM | POA: Diagnosis not present

## 2019-09-06 LAB — CBC
HCT: 34.9 % — ABNORMAL LOW (ref 36.0–46.0)
Hemoglobin: 10.8 g/dL — ABNORMAL LOW (ref 12.0–15.0)
MCH: 26.2 pg (ref 26.0–34.0)
MCHC: 30.9 g/dL (ref 30.0–36.0)
MCV: 84.7 fL (ref 80.0–100.0)
Platelets: 261 10*3/uL (ref 150–400)
RBC: 4.12 MIL/uL (ref 3.87–5.11)
RDW: 16.8 % — ABNORMAL HIGH (ref 11.5–15.5)
WBC: 4.4 10*3/uL (ref 4.0–10.5)
nRBC: 0 % (ref 0.0–0.2)

## 2019-09-06 LAB — COMPREHENSIVE METABOLIC PANEL
ALT: 16 U/L (ref 0–44)
AST: 25 U/L (ref 15–41)
Albumin: 3.5 g/dL (ref 3.5–5.0)
Alkaline Phosphatase: 67 U/L (ref 38–126)
Anion gap: 9 (ref 5–15)
BUN: 26 mg/dL — ABNORMAL HIGH (ref 8–23)
CO2: 30 mmol/L (ref 22–32)
Calcium: 9.1 mg/dL (ref 8.9–10.3)
Chloride: 102 mmol/L (ref 98–111)
Creatinine, Ser: 1.21 mg/dL — ABNORMAL HIGH (ref 0.44–1.00)
GFR calc Af Amer: 44 mL/min — ABNORMAL LOW (ref >60)
GFR calc non Af Amer: 38 mL/min — ABNORMAL LOW (ref >60)
Glucose, Bld: 81 mg/dL (ref 70–99)
Potassium: 3.5 mmol/L (ref 3.5–5.1)
Sodium: 141 mmol/L (ref 135–145)
Total Bilirubin: 0.5 mg/dL (ref 0.3–1.2)
Total Protein: 7 g/dL (ref 6.5–8.1)

## 2019-09-12 DIAGNOSIS — Z1159 Encounter for screening for other viral diseases: Secondary | ICD-10-CM | POA: Diagnosis not present

## 2019-09-12 DIAGNOSIS — M79601 Pain in right arm: Secondary | ICD-10-CM | POA: Diagnosis not present

## 2019-09-12 DIAGNOSIS — I130 Hypertensive heart and chronic kidney disease with heart failure and stage 1 through stage 4 chronic kidney disease, or unspecified chronic kidney disease: Secondary | ICD-10-CM | POA: Diagnosis not present

## 2019-09-16 DIAGNOSIS — F4323 Adjustment disorder with mixed anxiety and depressed mood: Secondary | ICD-10-CM | POA: Diagnosis not present

## 2019-09-16 DIAGNOSIS — F411 Generalized anxiety disorder: Secondary | ICD-10-CM | POA: Diagnosis not present

## 2019-09-19 DIAGNOSIS — Z1159 Encounter for screening for other viral diseases: Secondary | ICD-10-CM | POA: Diagnosis not present

## 2019-09-19 DIAGNOSIS — M79601 Pain in right arm: Secondary | ICD-10-CM | POA: Diagnosis not present

## 2019-09-19 DIAGNOSIS — I130 Hypertensive heart and chronic kidney disease with heart failure and stage 1 through stage 4 chronic kidney disease, or unspecified chronic kidney disease: Secondary | ICD-10-CM | POA: Diagnosis not present

## 2019-09-26 DIAGNOSIS — Z1159 Encounter for screening for other viral diseases: Secondary | ICD-10-CM | POA: Diagnosis not present

## 2019-09-26 DIAGNOSIS — I130 Hypertensive heart and chronic kidney disease with heart failure and stage 1 through stage 4 chronic kidney disease, or unspecified chronic kidney disease: Secondary | ICD-10-CM | POA: Diagnosis not present

## 2019-09-26 DIAGNOSIS — M79601 Pain in right arm: Secondary | ICD-10-CM | POA: Diagnosis not present

## 2019-09-30 ENCOUNTER — Other Ambulatory Visit: Payer: Self-pay | Admitting: Adult Health

## 2019-09-30 MED ORDER — LORAZEPAM 0.5 MG PO TABS: 0.2500 mg | ORAL_TABLET | Freq: Every day | ORAL | 0 refills | Status: DC

## 2019-10-02 DIAGNOSIS — F428 Other obsessive-compulsive disorder: Secondary | ICD-10-CM | POA: Diagnosis not present

## 2019-10-02 DIAGNOSIS — F411 Generalized anxiety disorder: Secondary | ICD-10-CM | POA: Diagnosis not present

## 2019-10-02 DIAGNOSIS — F320 Major depressive disorder, single episode, mild: Secondary | ICD-10-CM | POA: Diagnosis not present

## 2019-10-03 DIAGNOSIS — I130 Hypertensive heart and chronic kidney disease with heart failure and stage 1 through stage 4 chronic kidney disease, or unspecified chronic kidney disease: Secondary | ICD-10-CM | POA: Diagnosis not present

## 2019-10-03 DIAGNOSIS — M79601 Pain in right arm: Secondary | ICD-10-CM | POA: Diagnosis not present

## 2019-10-03 DIAGNOSIS — F0280 Dementia in other diseases classified elsewhere without behavioral disturbance: Secondary | ICD-10-CM | POA: Diagnosis not present

## 2019-10-03 DIAGNOSIS — F411 Generalized anxiety disorder: Secondary | ICD-10-CM | POA: Diagnosis not present

## 2019-10-03 DIAGNOSIS — Z1159 Encounter for screening for other viral diseases: Secondary | ICD-10-CM | POA: Diagnosis not present

## 2019-10-04 ENCOUNTER — Encounter: Payer: Self-pay | Admitting: Adult Health

## 2019-10-04 ENCOUNTER — Non-Acute Institutional Stay (SKILLED_NURSING_FACILITY): Payer: Medicare Other | Admitting: Adult Health

## 2019-10-04 DIAGNOSIS — N1832 Chronic kidney disease, stage 3b: Secondary | ICD-10-CM | POA: Diagnosis not present

## 2019-10-04 DIAGNOSIS — I5032 Chronic diastolic (congestive) heart failure: Secondary | ICD-10-CM

## 2019-10-04 DIAGNOSIS — K219 Gastro-esophageal reflux disease without esophagitis: Secondary | ICD-10-CM

## 2019-10-04 DIAGNOSIS — I130 Hypertensive heart and chronic kidney disease with heart failure and stage 1 through stage 4 chronic kidney disease, or unspecified chronic kidney disease: Secondary | ICD-10-CM | POA: Diagnosis not present

## 2019-10-04 DIAGNOSIS — J3089 Other allergic rhinitis: Secondary | ICD-10-CM

## 2019-10-04 NOTE — Progress Notes (Signed)
Location:    [place] Room Number: 122D Place of Service:  SNF (31) Phillips Grout NP    CODE STATUS: DNR  Allergies  Allergen Reactions  . Bee Venom Anaphylaxis  . Ace Inhibitors   . Angiotensin Receptor Blockers     Tongue swelling.  . Aspirin     Directed by physician  . Cabbage Other (See Comments)    Certain foods due to gout flares  . Latex Rash    Chief Complaint  Patient presents with  . Medical Management of Chronic Issues         Chronic non-seasonal allergic rhinitis:  Hypertensive heart and kidney disease with chronic diastolic congestive heart failure and stage 3 chronic kidney disease:  GERD without esophagitis:    HPI:  She is a 86 year old long term resident of this facility being seen for the management of her chronic illnesses: allergic rhinitis; hypertensive heart disease; gerd. There are no reports of heart burn; no reports of sinus congestive or cough. No reports of uncontrolled pain.   Past Medical History:  Diagnosis Date  . Anxiety   . Cellulitis 2016  . CHF (congestive heart failure) ([place])   . Chronic kidney disease   . Dementia ([place])   . Dysphagia   . Gait abnormality   . GERD (gastroesophageal reflux disease)   . Glaucoma   . Gout   . Gout, unspecified 01/12/2010   Qualifier: Diagnosis of  By: Moshe Cipro MD, Joycelyn Schmid    . Hyperlipidemia   . Hypertension   . Muscle weakness   . Myocardial infarction [facility]) [place]  . Osteoarthritis   . Syncope     Past Surgical History:  Procedure Laterality Date  . APPENDECTOMY    . CATARACT EXTRACTION W/PHACO  08/04/2011   Procedure: CATARACT EXTRACTION PHACO AND INTRAOCULAR LENS PLACEMENT (IOC);  Surgeon: Tonny Branch;  Location: [facility];  Service: Ophthalmology;  Laterality: Right;  CDE=18.53  . CATARACT EXTRACTION W/PHACO  08/25/2011   Procedure: CATARACT EXTRACTION PHACO AND INTRAOCULAR LENS PLACEMENT (IOC);  Surgeon: Tonny Branch;  Location: [facility];  Service:  Ophthalmology;  Laterality: Left;  CDE:14.76  . TONSILLECTOMY      Social History   Socioeconomic History  . Marital status: Divorced    Spouse name: Not on file  . Number of children: Not on file  . Years of education: Not on file  . Highest education level: Not on file  Occupational History  . Occupation: retired   Tobacco Use  . Smoking status: Never Smoker  . Smokeless tobacco: Never Used  Substance and Sexual Activity  . Alcohol use: No  . Drug use: No  . Sexual activity: Not Currently  Other Topics Concern  . Not on file  Social History Narrative   Long term resident [facility]    Social Determinants of Health   Financial Resource Strain:   . Difficulty of Paying Living Expenses: Not on file  Food Insecurity:   . Worried About Charity fundraiser in the Last Year: Not on file  . Ran Out of Food in the Last Year: Not on file  Transportation Needs:   . Lack of Transportation (Medical): Not on file  . Lack of Transportation (Non-Medical): Not on file  Physical Activity:   . Days of Exercise per Week: Not on file  . Minutes of Exercise per Session: Not on file  Stress:   . Feeling of Stress : Not  on file  Social Connections:   . Frequency of Communication with Friends and Family: Not on file  . Frequency of Social Gatherings with Friends and Family: Not on file  . Attends Religious Services: Not on file  . Active Member of Clubs or Organizations: Not on file  . Attends Archivist Meetings: Not on file  . Marital Status: Not on file  Intimate Partner Violence:   . Fear of Current or Ex-Partner: Not on file  . Emotionally Abused: Not on file  . Physically Abused: Not on file  . Sexually Abused: Not on file   Family History  Problem Relation Age of Onset  . Diabetes Father   . Diabetes Sister   . Diabetes Brother   . Stroke Sister   . Stroke Sister   . Anesthesia problems Neg Hx   . Hypotension Neg Hx   . Malignant hyperthermia Neg Hx   . Pseudochol  deficiency Neg Hx       VITAL SIGNS BP (!) 185/79   Pulse (!) 57   Temp 98.4 F (36.9 C) (Oral)   Resp 20   Ht 4\' 11"  (1.499 m)   Wt 161 lb 9.6 oz (73.3 kg)   SpO2 99%   BMI 32.64 kg/m   Outpatient Encounter Medications as of 10/04/2019  Medication Sig  . acetaminophen (TYLENOL) 500 MG tablet Take 1,000 mg by mouth 2 (two) times daily. For leg pain and arthritis. May have additional 1000 mg for break through pain once a day prn  . Alum & Mag Hydroxide-Simeth (MYLANTA MAXIMUM STRENGTH PO) Take by mouth. Suspension; 400-400-40 mg/5 ml; amt 4 teaspoons; For indigestion/ heartburn twice a day prn  . Balsam [place]-Castor Oil (VENELEX) OINT Apply topically to sacrum and bilateral buttocks every shift and as needed  . Brinzolamide-Brimonidine (SIMBRINZA) 1-0.2 % SUSP One  drop into both eyes three times a day   . Calcium Carbonate-Vitamin D (CALCIUM-VITAMIN D) 500-200 MG-UNIT tablet Take 1 tablet by mouth 2 (two) times daily.  . citalopram (CELEXA) 10 MG tablet Take 10 mg by mouth daily.  . cycloSPORINE (RESTASIS) 0.05 % ophthalmic emulsion Place 1 drop into both eyes 2 (two) times daily.   Marland Kitchen docusate sodium (COLACE) 100 MG capsule Take 100 mg by mouth 2 (two) times daily.  Marland Kitchen donepezil (ARICEPT) 5 MG tablet Take 5 mg by mouth daily. For dementia  . hydrALAZINE (APRESOLINE) 50 MG tablet Take 50 mg by mouth 2 (two) times a day.   . hydrochlorothiazide (HYDRODIURIL) 12.5 MG tablet Take 12.5 mg by mouth daily.   Marland Kitchen ipratropium-albuterol (DUONEB) 0.5-2.5 (3) MG/3ML SOLN Take 3 mLs by nebulization every 6 (six) hours as needed.  . loratadine (CLARITIN) 10 MG tablet Take 10 mg by mouth daily.  Marland Kitchen LORazepam (ATIVAN) 0.5 MG tablet Take 0.5 tablets (0.25 mg total) by mouth daily.  . nitroGLYCERIN (NITROSTAT) 0.4 MG SL tablet Place 0.4 mg under the tongue every 5 (five) minutes as needed for chest pain.   . NON FORMULARY Diet Type:  Regular, NAS  . OXYGEN Inhale 2 L/min into the lungs continuous. Wean to  keep sats above 90%  . pantoprazole (PROTONIX) 40 MG tablet Take 40 mg by mouth daily. Do not crush.   . potassium chloride (K-DUR,KLOR-CON) 10 MEQ tablet Take 10 mEq by mouth daily.  . Saline (OCEAN NASAL SPRAY NA) Place 1 spray into the nose every 2 (two) hours as needed.  . Travoprost, BAK Free, (TRAVATAN) 0.004 % SOLN ophthalmic  solution Place 1 drop into both eyes at bedtime. For glaucoma  . [DISCONTINUED] citalopram (CELEXA) 20 MG tablet Take 10 mg by mouth daily.    No facility-administered encounter medications on file as of 10/04/2019.     SIGNIFICANT DIAGNOSTIC EXAMS  PREVIOUS ;   07-22-19: right forearm x-ray  1. Impacted fracture of the radial neck junction. 2. Suspect additional nondisplaced fracture of the coronoid process. 3. Right oval swelling and trace joint effusion. 4. Degenerative arthrosis of the wrist and elbow. Enthesopathic changes of the olecranon.  07-22-19: right shoulder x-ray  1. The osseous structures appear diffusely demineralized which may limit detection of small or nondisplaced fractures. 2. No acute fracture or traumatic malalignment. 3. High-riding appearance of the humeral head likely reflecting rotator cuff insufficiency. 4. Moderate osteoarthrosis of the acromioclavicular and glenohumeral joints.   NO NEW EXAMS.   LABS REVIEWED PREVIOUS:   03-11-19: wbc 4.7; hgb 11.4; hct 36.7; mcv 83.4; plt 83.4; plt 211; glucose 87; bun 44; creat 1.32; k+ 3.8; na++ 139; ca 9.4; liver normal albumin 4.0   TODAY  09-06-19: wbc 4.4; hgb 10.8; hct 34.9; mcv 84.7 plt 261; glucose 81; bun 26; creat 1.21; k+ 3.5; na++ 141; ca 9.1 liver normal albumin 3.5     Review of Systems  Constitutional: Negative for malaise/fatigue.  Respiratory: Negative for cough and shortness of breath.   Cardiovascular: Negative for chest pain, palpitations and leg swelling.  Gastrointestinal: Negative for abdominal pain, constipation and heartburn.  Musculoskeletal: Negative for back  pain, joint pain and myalgias.  Skin: Negative.   Neurological: Negative for dizziness.  Psychiatric/Behavioral: The patient is not nervous/anxious.      Physical Exam Constitutional:      General: She is not in acute distress.    Appearance: She is well-developed. She is obese. She is not diaphoretic.  Eyes:     Comments: History of bilateral cataracts with implants    Neck:     Thyroid: No thyromegaly.  Cardiovascular:     Rate and Rhythm: Normal rate and regular rhythm.     Heart sounds: Normal heart sounds.     Comments: Pedal pulses faint  Pulmonary:     Effort: Pulmonary effort is normal. No respiratory distress.     Breath sounds: Normal breath sounds.  Abdominal:     General: Bowel sounds are normal. There is no distension.     Palpations: Abdomen is soft.     Tenderness: There is no abdominal tenderness.  Musculoskeletal:        General: Normal range of motion.     Cervical back: Neck supple.     Right lower leg: No edema.     Left lower leg: No edema.  Lymphadenopathy:     Cervical: No cervical adenopathy.  Skin:    General: Skin is warm and dry.  Neurological:     Mental Status: She is alert. Mental status is at baseline.  Psychiatric:        Mood and Affect: Mood normal.     ASSESSMENT/ PLAN:  TODAY:   1. Chronic non-seasonal allergic rhinitis: is stable will continue claritin 10 mg daily   2. Hypertensive heart and kidney disease with chronic diastolic congestive heart failure and stage 3 chronic kidney disease: is stable b/p 185/79 will continue hctz 12.5 mg daily and apresoline 50 mg twice daily  3. GERD without esophagitis: is stable will continue protonic 40 mg daily    PREVIOUS  4. Chronic respiratory failure with  hypoxia on home 02 therapy: is stable is 02 dependent will continue claritin 10 mg daily   5. Increased intraocular pressure bilateral: is stable will continue simbrinza to both eyes three times daily and travatan to both eyes  nightly  6. Hypokalemia: is stable continue k+ 10 meq daily  7. Vascular dementia with behavioral disturbance. No significant change in status: weight is 161 pounds; albumin 3.9; will continue aricept 5 mg daily. She is slowly losing weight which is an unfortunate but expected outcome at the late stages of this disease process.   8. Primary osteoarthritis multiple joints: is stable will continue tylenol 1 gm twice daily   9. CKD (chronic kidney disease) stage 3 GFR 30-59 ml/min is stable bun 27; creat 1.09  10. Chronic constipation; will continue colace twice daily   11. Depression with anxiety: is stable will continue celexa 10 mg daily and ativan 0.25 mg daily   12. Chronic diastolic CHF (congestive heart failure) EF 65-70% (03-04-18) will continue apresoline 50 mg twice daily and has prn ntg   13. Deep vein thrombosis (DVT) of left lower extremity unspecified chronicity unspecified vein: is stable will continue to monitor her status.         MD is aware of resident's narcotic use and is in agreement with current plan of care. We will attempt to wean resident as appropriate.  Ok Edwards NP [facility]  Contact [phone] Monday through Friday 8am- 5pm  After hours call [phone]

## 2019-10-11 DIAGNOSIS — I130 Hypertensive heart and chronic kidney disease with heart failure and stage 1 through stage 4 chronic kidney disease, or unspecified chronic kidney disease: Secondary | ICD-10-CM | POA: Diagnosis not present

## 2019-10-11 DIAGNOSIS — Z1159 Encounter for screening for other viral diseases: Secondary | ICD-10-CM | POA: Diagnosis not present

## 2019-10-11 DIAGNOSIS — M79601 Pain in right arm: Secondary | ICD-10-CM | POA: Diagnosis not present

## 2019-10-16 DIAGNOSIS — F411 Generalized anxiety disorder: Secondary | ICD-10-CM | POA: Diagnosis not present

## 2019-10-16 DIAGNOSIS — F428 Other obsessive-compulsive disorder: Secondary | ICD-10-CM | POA: Diagnosis not present

## 2019-10-16 DIAGNOSIS — F320 Major depressive disorder, single episode, mild: Secondary | ICD-10-CM | POA: Diagnosis not present

## 2019-10-18 DIAGNOSIS — I130 Hypertensive heart and chronic kidney disease with heart failure and stage 1 through stage 4 chronic kidney disease, or unspecified chronic kidney disease: Secondary | ICD-10-CM | POA: Diagnosis not present

## 2019-10-18 DIAGNOSIS — M79601 Pain in right arm: Secondary | ICD-10-CM | POA: Diagnosis not present

## 2019-10-18 DIAGNOSIS — Z1159 Encounter for screening for other viral diseases: Secondary | ICD-10-CM | POA: Diagnosis not present

## 2019-10-23 ENCOUNTER — Other Ambulatory Visit: Payer: Self-pay | Admitting: Adult Health

## 2019-10-23 MED ORDER — LORAZEPAM 0.5 MG PO TABS: 0.2500 mg | ORAL_TABLET | Freq: Every day | ORAL | 0 refills | Status: DC

## 2019-10-30 ENCOUNTER — Encounter: Payer: Self-pay | Admitting: Internal Medicine

## 2019-10-30 ENCOUNTER — Non-Acute Institutional Stay (SKILLED_NURSING_FACILITY): Payer: Medicare Other | Admitting: Internal Medicine

## 2019-10-30 DIAGNOSIS — M79601 Pain in right arm: Secondary | ICD-10-CM | POA: Diagnosis not present

## 2019-10-30 DIAGNOSIS — F0281 Dementia in other diseases classified elsewhere with behavioral disturbance: Secondary | ICD-10-CM

## 2019-10-30 DIAGNOSIS — F428 Other obsessive-compulsive disorder: Secondary | ICD-10-CM | POA: Diagnosis not present

## 2019-10-30 DIAGNOSIS — K219 Gastro-esophageal reflux disease without esophagitis: Secondary | ICD-10-CM | POA: Diagnosis not present

## 2019-10-30 DIAGNOSIS — K5909 Other constipation: Secondary | ICD-10-CM

## 2019-10-30 DIAGNOSIS — I130 Hypertensive heart and chronic kidney disease with heart failure and stage 1 through stage 4 chronic kidney disease, or unspecified chronic kidney disease: Secondary | ICD-10-CM | POA: Diagnosis not present

## 2019-10-30 DIAGNOSIS — F411 Generalized anxiety disorder: Secondary | ICD-10-CM | POA: Diagnosis not present

## 2019-10-30 DIAGNOSIS — G301 Alzheimer's disease with late onset: Secondary | ICD-10-CM | POA: Diagnosis not present

## 2019-10-30 DIAGNOSIS — Z1159 Encounter for screening for other viral diseases: Secondary | ICD-10-CM | POA: Diagnosis not present

## 2019-10-30 DIAGNOSIS — F320 Major depressive disorder, single episode, mild: Secondary | ICD-10-CM | POA: Diagnosis not present

## 2019-10-30 NOTE — Progress Notes (Signed)
Location:  [place] Room Number: 112-D Place of Service:  SNF (31)  [name] Duos, MD  Patient Care Team: [name] Duos, MD as PCP - General (Internal Medicine) [name] Phylis Bougie, NP as Nurse Practitioner ([place]) [facility], [place] ([place])  Extended Emergency Contact Information Primary Emergency Contact: [place], [place] [address] [name] of [place] Phone: [phone] Relation: Daughter Secondary Emergency Contact: [name] [place] of [place] Phone: [phone] Relation: Son    Allergies: Bee venom, Ace inhibitors, Angiotensin receptor blockers, Aspirin, Cabbage, and Latex  Chief Complaint  Patient presents with  . Medical Management of Chronic Issues    Routine [place] visit    HPI: Patient is a 86 y.o. female who is being seen for routine issues of constipation, GERD, and dementia.  Past Medical History:  Diagnosis Date  . Anxiety   . Cellulitis 2016  . CHF (congestive heart failure) ([place])   . Chronic kidney disease   . Dementia ([place])   . Dysphagia   . Gait abnormality   . GERD (gastroesophageal reflux disease)   . Glaucoma   . Gout   . Gout, unspecified 01/12/2010   Qualifier: Diagnosis of  By: Moshe Cipro MD, Joycelyn Schmid    . Hyperlipidemia   . Hypertension   . Muscle weakness   . Myocardial infarction [facility]) [place]  . Osteoarthritis   . Syncope     Past Surgical History:  Procedure Laterality Date  . APPENDECTOMY    . CATARACT EXTRACTION W/PHACO  08/04/2011   Procedure: CATARACT EXTRACTION PHACO AND INTRAOCULAR LENS PLACEMENT (IOC);  Surgeon: Tonny Branch;  Location: [facility];  Service: Ophthalmology;  Laterality: Right;  CDE=18.53  . CATARACT EXTRACTION W/PHACO  08/25/2011   Procedure: CATARACT EXTRACTION PHACO AND INTRAOCULAR LENS PLACEMENT (IOC);  Surgeon: Tonny Branch;  Location: [facility];  Service: Ophthalmology;  Laterality:  Left;  CDE:14.76  . TONSILLECTOMY      Allergies as of 10/30/2019      Reactions   Bee Venom Anaphylaxis   Ace Inhibitors    Angiotensin Receptor Blockers    Tongue swelling.   Aspirin    Directed by physician   Cabbage Other (See Comments)   Certain foods due to gout flares   Latex Rash      Medication List    Notice   This visit is during an admission. Changes to the med list made in this visit will be reflected in the After Visit Summary of the admission.    Current Outpatient Medications on File Prior to Visit  Medication Sig Dispense Refill  . acetaminophen (TYLENOL) 500 MG tablet Take 1,000 mg by mouth 2 (two) times daily. For leg pain and arthritis. May have additional 1000 mg for break through pain once a day prn    . alum & mag hydroxide-simeth (MAALOX PLUS) 400-400-40 MG/5ML suspension Take 20 mLs by mouth 2 (two) times daily as needed for indigestion.    Roseanne Kaufman Peru-Castor Oil (VENELEX) OINT Apply topically to sacrum and bilateral buttocks every shift and as needed    . Brinzolamide-Brimonidine (SIMBRINZA) 1-0.2 % SUSP One  drop into both eyes three times a day     . Calcium Carbonate-Vitamin D (CALCIUM-VITAMIN D) 500-200 MG-UNIT tablet Take 1 tablet by mouth 2 (two) times daily.    . citalopram (CELEXA) 10 MG tablet Take 10 mg by mouth daily.    Marland Kitchen  cycloSPORINE (RESTASIS) 0.05 % ophthalmic emulsion Place 1 drop into both eyes 2 (two) times daily.     Marland Kitchen docusate sodium (COLACE) 100 MG capsule Take 100 mg by mouth 2 (two) times daily.    Marland Kitchen donepezil (ARICEPT) 5 MG tablet Take 5 mg by mouth daily. For dementia    . hydrALAZINE (APRESOLINE) 50 MG tablet Take 50 mg by mouth 2 (two) times a day.     . hydrochlorothiazide (HYDRODIURIL) 12.5 MG tablet Take 12.5 mg by mouth daily.     Marland Kitchen ipratropium-albuterol (DUONEB) 0.5-2.5 (3) MG/3ML SOLN Take 3 mLs by nebulization every 6 (six) hours as needed.    . loratadine (CLARITIN) 10 MG tablet Take 10 mg by mouth daily.    Marland Kitchen  LORazepam (ATIVAN) 0.5 MG tablet Take 0.5 tablets (0.25 mg total) by mouth daily. 15 tablet 0  . nitroGLYCERIN (NITROSTAT) 0.4 MG SL tablet Place 0.4 mg under the tongue every 5 (five) minutes as needed for chest pain.     . NON FORMULARY Diet Type:  Regular, NAS    . OXYGEN Inhale 2 L/min into the lungs continuous. Wean to keep sats above 90%    . pantoprazole (PROTONIX) 40 MG tablet Take 40 mg by mouth daily. Do not crush.     . potassium chloride (K-DUR,KLOR-CON) 10 MEQ tablet Take 10 mEq by mouth daily.    . Saline (OCEAN NASAL SPRAY NA) Place 1 spray into the nose every 2 (two) hours as needed.    . Travoprost, BAK Free, (TRAVATAN) 0.004 % SOLN ophthalmic solution Place 1 drop into both eyes at bedtime. For glaucoma     No current facility-administered medications on file prior to visit.     No orders of the defined types were placed in this encounter.   Immunization History  Administered Date(s) Administered  . Influenza Whole 06/11/2007, 10/16/2008, 05/21/2009, 05/05/2010  . Influenza-Unspecified 06/05/2014, 06/02/2016, 06/02/2017, 05/31/2018, 06/03/2019  . Pneumococcal Conjugate-13 06/06/2016  . Pneumococcal Polysaccharide-23 06/11/2007, 06/01/2016, 03/04/2018  . Td 06/11/2007  . Tdap 06/24/2017  . Zoster 05/21/2007    Social History   Tobacco Use  . Smoking status: Never Smoker  . Smokeless tobacco: Never Used  Substance Use Topics  . Alcohol use: No    Review of Systems  GENERAL:  no fevers, fatigue, appetite changes SKIN: No itching, rash HEENT: No complaint RESPIRATORY: No cough, wheezing, SOB CARDIAC: No chest pain, palpitations, lower extremity edema  GI: No abdominal pain, No N/V/D or constipation, No heartburn or reflux  GU: No dysuria, frequency or urgency, or incontinence  MUSCULOSKELETAL: No unrelieved bone/joint pain NEUROLOGIC: No headache, dizziness  PSYCHIATRIC: No overt anxiety or sadness  Vitals:   10/30/19 1536  BP: (!) 185/79  Pulse: (!)  58  Resp: 20  Temp: 98.2 F (36.8 C)  SpO2: 97%   Body mass index is 32.03 kg/m. Physical Exam  GENERAL APPEARANCE: Alert, conversant, No acute distress  SKIN: No diaphoresis rash HEENT: Unremarkable RESPIRATORY: Breathing is even, unlabored. Lung sounds are clear   CARDIOVASCULAR: Heart RRR no murmurs, rubs or gallops. No peripheral edema  GASTROINTESTINAL: Abdomen is soft, non-tender, not distended w/ normal bowel sounds.  GENITOURINARY: Bladder non tender, not distended  MUSCULOSKELETAL: No abnormal joints or musculature NEUROLOGIC: Cranial nerves 2-12 grossly intact. Moves all extremities PSYCHIATRIC: Mood and affect appropriate with dementia, no behavioral issues  Patient Active Problem List   Diagnosis Date Noted  . Closed right radial fracture 08/06/2019  . Fracture of radial neck,  right, closed 08/06/2019  . Hypertensive heart and kidney disease with chronic diastolic congestive heart failure and stage 3 chronic kidney disease ([place]) 10/20/2018  . Chronic respiratory failure with hypoxia, on home O2 therapy ([place]) 10/20/2018  . Chronic constipation 10/20/2018  . Increased intraocular pressure, bilateral 10/20/2018  . CKD (chronic kidney disease) stage 3, GFR 30-59 ml/min 03/03/2018  . Chronic diastolic CHF (congestive heart failure) ([place]) 03/03/2018  . Hyperglycemia 12/31/2015  . DVT (deep venous thrombosis) ([place]) 05/22/2015  . GERD 05/04/2010  . Depression with anxiety 07/05/2009  . Chronic non-seasonal allergic rhinitis 12/21/2008  . Hyperlipidemia 11/07/2007  . Dementia with behavioral disturbance ([place]) 11/07/2007  . Essential hypertension 11/07/2007  . Osteoarthritis 11/07/2007    CMP     Component Value Date/Time   NA 141 09/06/2019 0600   K 3.5 09/06/2019 0600   CL 102 09/06/2019 0600   CO2 30 09/06/2019 0600   GLUCOSE 81 09/06/2019 0600   BUN 26 (H) 09/06/2019 0600   CREATININE 1.21 (H) 09/06/2019 0600   CREATININE 1.12 (H) 05/01/2014 1028   CALCIUM  9.1 09/06/2019 0600   PROT 7.0 09/06/2019 0600   ALBUMIN 3.5 09/06/2019 0600   AST 25 09/06/2019 0600   ALT 16 09/06/2019 0600   ALKPHOS 67 09/06/2019 0600   BILITOT 0.5 09/06/2019 0600   GFRNONAA 38 (L) 09/06/2019 0600   GFRAA 44 (L) 09/06/2019 0600   Recent Labs    03/11/19 0425 09/06/19 0600  NA 139 141  K 3.8 3.5  CL 100 102  CO2 29 30  GLUCOSE 87 81  BUN 44* 26*  CREATININE 1.32* 1.21*  CALCIUM 9.4 9.1   Recent Labs    03/11/19 0425 09/06/19 0600  AST 22 25  ALT 11 16  ALKPHOS 77 67  BILITOT 0.5 0.5  PROT 7.6 7.0  ALBUMIN 4.0 3.5   Recent Labs    03/11/19 0425 09/06/19 0600  WBC 4.7 4.4  HGB 11.4* 10.8*  HCT 36.7 34.9*  MCV 83.4 84.7  PLT 211 261   No results for input(s): CHOL, LDLCALC, TRIG in the last 8760 hours.  Invalid input(s): HCL No results found for: [facility] Lab Results  Component Value Date   TSH 0.369 03/05/2018   Lab Results  Component Value Date   HGBA1C 5.5 03/20/2017   Lab Results  Component Value Date   CHOL 234 (H) 09/22/2017   HDL 122 09/22/2017   LDLCALC 99 09/22/2017   TRIG 65 09/22/2017   CHOLHDL 1.9 09/22/2017    Significant Diagnostic Results in last 30 days:  No results found.  Assessment and Plan  Chronic constipation Stable; continue Colace 100 mg twice daily  GERD Chronic and stable; continue Protonix 40 mg daily  Dementia with behavioral disturbance Chronic and stable; continue Aricept 5 mg daily     [name] Duos, MD

## 2019-11-03 ENCOUNTER — Encounter: Payer: Self-pay | Admitting: Internal Medicine

## 2019-11-03 NOTE — Assessment & Plan Note (Signed)
Chronic and stable; continue Protonix 40 mg daily

## 2019-11-03 NOTE — Assessment & Plan Note (Signed)
Stable; continue Colace 100 mg twice daily

## 2019-11-03 NOTE — Assessment & Plan Note (Signed)
Chronic and stable; continue Aricept 5 mg daily

## 2019-11-06 ENCOUNTER — Non-Acute Institutional Stay: Payer: Self-pay | Admitting: Adult Health

## 2019-11-06 ENCOUNTER — Encounter: Payer: Self-pay | Admitting: Adult Health

## 2019-11-06 DIAGNOSIS — F418 Other specified anxiety disorders: Secondary | ICD-10-CM

## 2019-11-06 DIAGNOSIS — F411 Generalized anxiety disorder: Secondary | ICD-10-CM | POA: Diagnosis not present

## 2019-11-06 DIAGNOSIS — F428 Other obsessive-compulsive disorder: Secondary | ICD-10-CM | POA: Diagnosis not present

## 2019-11-06 DIAGNOSIS — F320 Major depressive disorder, single episode, mild: Secondary | ICD-10-CM | POA: Diagnosis not present

## 2019-11-06 NOTE — Progress Notes (Signed)
Location:    [facility] Room Number: 122/D Place of Service:  SNF (31)   CODE STATUS: DNR  Allergies  Allergen Reactions  . Bee Venom Anaphylaxis  . Ace Inhibitors   . Angiotensin Receptor Blockers     Tongue swelling.  . Aspirin     Directed by physician  . Cabbage Other (See Comments)    Certain foods due to gout flares  . Latex Rash    Chief Complaint  Patient presents with  . Medication Management    Medication Review     HPI:  She is presently taking ativan 0.25 mg daily and celexa 10 mg daily for her anxiety and depression. She would not allow for the staff to wash her wheelchair. She is hoarding more. There are no reports of agitation no reports of insomnia.   Past Medical History:  Diagnosis Date  . Anxiety   . Cellulitis 2016  . CHF (congestive heart failure) ([place])   . Chronic kidney disease   . Dementia ([place])   . Dysphagia   . Gait abnormality   . GERD (gastroesophageal reflux disease)   . Glaucoma   . Gout   . Gout, unspecified 01/12/2010   Qualifier: Diagnosis of  By: Moshe Cipro MD, Joycelyn Schmid    . Hyperlipidemia   . Hypertension   . Muscle weakness   . Myocardial infarction [facility]) [place]  . Osteoarthritis   . Syncope     Past Surgical History:  Procedure Laterality Date  . APPENDECTOMY    . CATARACT EXTRACTION W/PHACO  08/04/2011   Procedure: CATARACT EXTRACTION PHACO AND INTRAOCULAR LENS PLACEMENT (IOC);  Surgeon: Tonny Branch;  Location: [facility]S;  Service: Ophthalmology;  Laterality: Right;  CDE=18.53  . CATARACT EXTRACTION W/PHACO  08/25/2011   Procedure: CATARACT EXTRACTION PHACO AND INTRAOCULAR LENS PLACEMENT (IOC);  Surgeon: Tonny Branch;  Location: [facility]S;  Service: Ophthalmology;  Laterality: Left;  CDE:14.76  . TONSILLECTOMY      Social History   Socioeconomic History  . Marital status: Divorced    Spouse name: Not on file  . Number of children: Not on file  . Years of education: Not on file  . Highest  education level: Not on file  Occupational History  . Occupation: retired   Tobacco Use  . Smoking status: Never Smoker  . Smokeless tobacco: Never Used  Substance and Sexual Activity  . Alcohol use: No  . Drug use: No  . Sexual activity: Not Currently  Other Topics Concern  . Not on file  Social History Narrative   Long term resident [facility]    Social Determinants of Health   Financial Resource Strain:   . Difficulty of Paying Living Expenses: Not on file  Food Insecurity:   . Worried About Charity fundraiser in the Last Year: Not on file  . Ran Out of Food in the Last Year: Not on file  Transportation Needs:   . Lack of Transportation (Medical): Not on file  . Lack of Transportation (Non-Medical): Not on file  Physical Activity:   . Days of Exercise per Week: Not on file  . Minutes of Exercise per Session: Not on file  Stress:   . Feeling of Stress : Not on file  Social Connections:   . Frequency of Communication with Friends and Family: Not on file  . Frequency of Social Gatherings with Friends and Family: Not on file  . Attends Religious Services: Not on file  .  Active Member of Clubs or Organizations: Not on file  . Attends Archivist Meetings: Not on file  . Marital Status: Not on file  Intimate Partner Violence:   . Fear of Current or Ex-Partner: Not on file  . Emotionally Abused: Not on file  . Physically Abused: Not on file  . Sexually Abused: Not on file   Family History  Problem Relation Age of Onset  . Diabetes Father   . Diabetes Sister   . Diabetes Brother   . Stroke Sister   . Stroke Sister   . Anesthesia problems Neg Hx   . Hypotension Neg Hx   . Malignant hyperthermia Neg Hx   . Pseudochol deficiency Neg Hx       VITAL SIGNS BP (!) 185/79   Pulse (!) 58   Temp 98.8 F (37.1 C) (Oral)   Resp 20   Ht 4\' 11"  (1.499 m)   Wt 158 lb 9.6 oz (71.9 kg)   SpO2 100%   BMI 32.03 kg/m   Outpatient Encounter Medications as of 11/06/2019    Medication Sig  . acetaminophen (TYLENOL) 500 MG tablet Take 1,000 mg by mouth 2 (two) times daily. For leg pain and arthritis. May have additional 1000 mg for break through pain once a day prn  . alum & mag hydroxide-simeth (MAALOX PLUS) 400-400-40 MG/5ML suspension Take 20 mLs by mouth 2 (two) times daily as needed for indigestion.  Roseanne Kaufman [place]-Castor Oil (VENELEX) OINT Apply topically to sacrum and bilateral buttocks every shift and as needed  . Brinzolamide-Brimonidine (SIMBRINZA) 1-0.2 % SUSP One  drop into both eyes three times a day   . Calcium Carbonate-Vitamin D (CALCIUM-VITAMIN D) 500-200 MG-UNIT tablet Take 1 tablet by mouth 2 (two) times daily.  . citalopram (CELEXA) 10 MG tablet Take 10 mg by mouth daily.  . cycloSPORINE (RESTASIS) 0.05 % ophthalmic emulsion Place 1 drop into both eyes 2 (two) times daily.   Marland Kitchen docusate sodium (COLACE) 100 MG capsule Take 100 mg by mouth 2 (two) times daily.  Marland Kitchen donepezil (ARICEPT) 5 MG tablet Take 5 mg by mouth daily. For dementia  . hydrALAZINE (APRESOLINE) 50 MG tablet Take 50 mg by mouth 2 (two) times a day.   . hydrochlorothiazide (HYDRODIURIL) 12.5 MG tablet Take 12.5 mg by mouth daily.   Marland Kitchen ipratropium-albuterol (DUONEB) 0.5-2.5 (3) MG/3ML SOLN Take 3 mLs by nebulization every 6 (six) hours as needed.  . loratadine (CLARITIN) 10 MG tablet Take 10 mg by mouth daily.  Marland Kitchen LORazepam (ATIVAN) 0.5 MG tablet Take 0.5 tablets (0.25 mg total) by mouth daily.  . nitroGLYCERIN (NITROSTAT) 0.4 MG SL tablet Place 0.4 mg under the tongue every 5 (five) minutes as needed for chest pain.   . NON FORMULARY Diet Type:  Regular, NAS  . OXYGEN Inhale 2 L/min into the lungs continuous. Wean to keep sats above 90%  . pantoprazole (PROTONIX) 40 MG tablet Take 40 mg by mouth daily. Do not crush.   . potassium chloride (K-DUR,KLOR-CON) 10 MEQ tablet Take 10 mEq by mouth daily.  . Saline (OCEAN NASAL SPRAY NA) Place 1 spray into the nose every 2 (two) hours as needed.   . Travoprost, BAK Free, (TRAVATAN) 0.004 % SOLN ophthalmic solution Place 1 drop into both eyes at bedtime. For glaucoma   No facility-administered encounter medications on file as of 11/06/2019.     SIGNIFICANT DIAGNOSTIC EXAMS   PREVIOUS ;   07-22-19: right forearm x-ray  1. Impacted fracture  of the radial neck junction. 2. Suspect additional nondisplaced fracture of the coronoid process. 3. Right oval swelling and trace joint effusion. 4. Degenerative arthrosis of the wrist and elbow. Enthesopathic changes of the olecranon.  07-22-19: right shoulder x-ray  1. The osseous structures appear diffusely demineralized which may limit detection of small or nondisplaced fractures. 2. No acute fracture or traumatic malalignment. 3. High-riding appearance of the humeral head likely reflecting rotator cuff insufficiency. 4. Moderate osteoarthrosis of the acromioclavicular and glenohumeral joints.   NO NEW EXAMS.   LABS REVIEWED PREVIOUS:   03-11-19: wbc 4.7; hgb 11.4; hct 36.7; mcv 83.4; plt 83.4; plt 211; glucose 87; bun 44; creat 1.32; k+ 3.8; na++ 139; ca 9.4; liver normal albumin 4.0  09-06-19: wbc 4.4; hgb 10.8; hct 34.9; mcv 84.7 plt 261; glucose 81; bun 26; creat 1.21; k+ 3.5; na++ 141; ca 9.1 liver normal albumin 3.5     NO NEW LABS.   Review of Systems  Constitutional: Negative for malaise/fatigue.  Respiratory: Negative for cough and shortness of breath.   Cardiovascular: Negative for chest pain, palpitations and leg swelling.  Gastrointestinal: Negative for abdominal pain, constipation and heartburn.  Musculoskeletal: Negative for back pain, joint pain and myalgias.  Skin: Negative.   Neurological: Negative for dizziness.  Psychiatric/Behavioral: The patient is not nervous/anxious.    Physical Exam Constitutional:      General: She is not in acute distress.    Appearance: She is well-developed. She is obese. She is not diaphoretic.  Eyes:     Comments: History of  bilateral cataracts with implants     Neck:     Thyroid: No thyromegaly.  Cardiovascular:     Rate and Rhythm: Normal rate and regular rhythm.     Pulses: Normal pulses.     Heart sounds: Normal heart sounds.     Comments: Pulses faint  Pulmonary:     Effort: Pulmonary effort is normal. No respiratory distress.     Breath sounds: Normal breath sounds.  Abdominal:     General: Bowel sounds are normal. There is no distension.     Palpations: Abdomen is soft.     Tenderness: There is no abdominal tenderness.  Musculoskeletal:        General: Normal range of motion.     Cervical back: Neck supple.     Right lower leg: No edema.     Left lower leg: No edema.  Lymphadenopathy:     Cervical: No cervical adenopathy.  Skin:    General: Skin is warm and dry.  Neurological:     Mental Status: She is alert. Mental status is at baseline.  Psychiatric:        Mood and Affect: Mood normal.       ASSESSMENT/ PLAN:  TODAY  1.  Depression with anxiety: is not being adequately managed: will continue ativan 0.25 mg daily and will increase celexa to 20 mg daily will monitor   MD is aware of resident's narcotic use and is in agreement with current plan of care. We will attempt to wean resident as appropriate.  Ok Edwards NP [facility]  Contact [phone] Monday through Friday 8am- 5pm  After hours call [phone]

## 2019-11-07 DIAGNOSIS — Z1159 Encounter for screening for other viral diseases: Secondary | ICD-10-CM | POA: Diagnosis not present

## 2019-11-07 DIAGNOSIS — M79601 Pain in right arm: Secondary | ICD-10-CM | POA: Diagnosis not present

## 2019-11-07 DIAGNOSIS — I130 Hypertensive heart and chronic kidney disease with heart failure and stage 1 through stage 4 chronic kidney disease, or unspecified chronic kidney disease: Secondary | ICD-10-CM | POA: Diagnosis not present

## 2019-11-13 DIAGNOSIS — Z1159 Encounter for screening for other viral diseases: Secondary | ICD-10-CM | POA: Diagnosis not present

## 2019-11-13 DIAGNOSIS — F411 Generalized anxiety disorder: Secondary | ICD-10-CM | POA: Diagnosis not present

## 2019-11-13 DIAGNOSIS — I130 Hypertensive heart and chronic kidney disease with heart failure and stage 1 through stage 4 chronic kidney disease, or unspecified chronic kidney disease: Secondary | ICD-10-CM | POA: Diagnosis not present

## 2019-11-13 DIAGNOSIS — F320 Major depressive disorder, single episode, mild: Secondary | ICD-10-CM | POA: Diagnosis not present

## 2019-11-13 DIAGNOSIS — F428 Other obsessive-compulsive disorder: Secondary | ICD-10-CM | POA: Diagnosis not present

## 2019-11-13 DIAGNOSIS — M79601 Pain in right arm: Secondary | ICD-10-CM | POA: Diagnosis not present

## 2019-11-18 ENCOUNTER — Other Ambulatory Visit: Payer: Self-pay | Admitting: Adult Health

## 2019-11-18 MED ORDER — LORAZEPAM 0.5 MG PO TABS: 0.2500 mg | ORAL_TABLET | Freq: Every day | ORAL | 0 refills | Status: DC

## 2019-11-27 DIAGNOSIS — F428 Other obsessive-compulsive disorder: Secondary | ICD-10-CM | POA: Diagnosis not present

## 2019-11-27 DIAGNOSIS — F320 Major depressive disorder, single episode, mild: Secondary | ICD-10-CM | POA: Diagnosis not present

## 2019-11-27 DIAGNOSIS — F411 Generalized anxiety disorder: Secondary | ICD-10-CM | POA: Diagnosis not present

## 2019-11-28 DIAGNOSIS — F320 Major depressive disorder, single episode, mild: Secondary | ICD-10-CM | POA: Diagnosis not present

## 2019-11-28 DIAGNOSIS — F411 Generalized anxiety disorder: Secondary | ICD-10-CM | POA: Diagnosis not present

## 2019-11-28 DIAGNOSIS — F428 Other obsessive-compulsive disorder: Secondary | ICD-10-CM | POA: Diagnosis not present

## 2019-12-03 DIAGNOSIS — Z1159 Encounter for screening for other viral diseases: Secondary | ICD-10-CM | POA: Diagnosis not present

## 2019-12-03 DIAGNOSIS — M79601 Pain in right arm: Secondary | ICD-10-CM | POA: Diagnosis not present

## 2019-12-05 ENCOUNTER — Non-Acute Institutional Stay (SKILLED_NURSING_FACILITY): Payer: Medicare Other | Admitting: Adult Health

## 2019-12-05 ENCOUNTER — Encounter: Payer: Self-pay | Admitting: Adult Health

## 2019-12-05 DIAGNOSIS — N1832 Chronic kidney disease, stage 3b: Secondary | ICD-10-CM

## 2019-12-05 DIAGNOSIS — F0281 Dementia in other diseases classified elsewhere with behavioral disturbance: Secondary | ICD-10-CM | POA: Diagnosis not present

## 2019-12-05 DIAGNOSIS — G301 Alzheimer's disease with late onset: Secondary | ICD-10-CM | POA: Diagnosis not present

## 2019-12-05 DIAGNOSIS — F02818 Dementia in other diseases classified elsewhere, unspecified severity, with other behavioral disturbance: Secondary | ICD-10-CM

## 2019-12-05 DIAGNOSIS — I5032 Chronic diastolic (congestive) heart failure: Secondary | ICD-10-CM

## 2019-12-05 NOTE — Progress Notes (Signed)
Location:    [place] Room Number: 122/D Place of Service:  SNF (31)   CODE STATUS: DNR  Allergies  Allergen Reactions  . Bee Venom Anaphylaxis  . Ace Inhibitors   . Angiotensin Receptor Blockers     Tongue swelling.  . Aspirin     Directed by physician  . Cabbage Other (See Comments)    Certain foods due to gout flares  . Latex Rash    Chief Complaint  Patient presents with  . Acute Visit    care plan meeting   HPI:  We have come together for her routine care plan meeting. Family present BIMS 15/15 mood 0/30. No falls; her weight is stable. No reports of changes in appetite. No reports of uncontrolled pain. No reports of agitation or anxiety. She continue to be followed for her chronic illnesses including: Chronic diastolic CHF (congestive heart failure) Late onset alzheimer's disease with behavioral disturbance Stage 3b chronic kidney disease  Past Medical History:  Diagnosis Date  . Anxiety   . Cellulitis 2016  . CHF (congestive heart failure) ([place])   . Chronic kidney disease   . Dementia ([place])   . Dysphagia   . Gait abnormality   . GERD (gastroesophageal reflux disease)   . Glaucoma   . Gout   . Gout, unspecified 01/12/2010   Qualifier: Diagnosis of  By: Moshe Cipro MD, Joycelyn Schmid    . Hyperlipidemia   . Hypertension   . Muscle weakness   . Myocardial infarction [facility]) [place]  . Osteoarthritis   . Syncope     Past Surgical History:  Procedure Laterality Date  . APPENDECTOMY    . CATARACT EXTRACTION W/PHACO  08/04/2011   Procedure: CATARACT EXTRACTION PHACO AND INTRAOCULAR LENS PLACEMENT (IOC);  Surgeon: Tonny Branch;  Location: [facility] OR[facility];  Service: Ophthalmology;  Laterality: Right;  CDE=18.53  . CATARACT EXTRACTION W/PHACO  08/25/2011   Procedure: CATARACT EXTRACTION PHACO AND INTRAOCULAR LENS PLACEMENT (IOC);  Surgeon: Tonny Branch;  Location: [facility] ORS;  Service: Ophthalmology;  Laterality: Left;  CDE:14.76  . TONSILLECTOMY       Social History   Socioeconomic History  . Marital status: Divorced    Spouse name: Not on file  . Number of children: Not on file  . Years of education: Not on file  . Highest education level: Not on file  Occupational History  . Occupation: retired   Tobacco Use  . Smoking status: Never Smoker  . Smokeless tobacco: Never Used  Substance and Sexual Activity  . Alcohol use: No  . Drug use: No  . Sexual activity: Not Currently  Other Topics Concern  . Not on file  Social History Narrative   Long term resident [facility]    Social Determinants of Health   Financial Resource Strain:   . Difficulty of Paying Living Expenses:   Food Insecurity:   . Worried About Charity fundraiser in the Last Year:   . Arboriculturist in the Last Year:   Transportation Needs:   . Film/video editor (Medical):   Marland Kitchen Lack of Transportation (Non-Medical):   Physical Activity:   . Days of Exercise per Week:   . Minutes of Exercise per Session:   Stress:   . Feeling of Stress :   Social Connections:   . Frequency of Communication with Friends and Family:   . Frequency of Social Gatherings with Friends and Family:   . Attends Religious Services:   .  Active Member of Clubs or Organizations:   . Attends Archivist Meetings:   Marland Kitchen Marital Status:   Intimate Partner Violence:   . Fear of Current or Ex-Partner:   . Emotionally Abused:   Marland Kitchen Physically Abused:   . Sexually Abused:    Family History  Problem Relation Age of Onset  . Diabetes Father   . Diabetes Sister   . Diabetes Brother   . Stroke Sister   . Stroke Sister   . Anesthesia problems Neg Hx   . Hypotension Neg Hx   . Malignant hyperthermia Neg Hx   . Pseudochol deficiency Neg Hx       VITAL SIGNS BP (!) 149/77   Pulse 62   Temp 99 F (37.2 C)   Resp 18   Ht 4\' 11"  (1.499 m)   Wt 160 lb (72.6 kg)   SpO2 97%   BMI 32.32 kg/m   Outpatient Encounter Medications as of 12/05/2019  Medication Sig  . acetaminophen  (TYLENOL) 500 MG tablet Take 1,000 mg by mouth every 8 (eight) hours as needed. For leg pain and arthritis. May have additional 1000 mg for break through pain once a day prn  . alum & mag hydroxide-simeth (MAALOX PLUS) 400-400-40 MG/5ML suspension Take 20 mLs by mouth 2 (two) times daily as needed for indigestion.  . Alum & Mag Hydroxide-Simeth (MYLANTA PO) Take by mouth. liquid; regular; amt: 4 tsp.; oral Special Instructions: after breakfast Once A Day 09:00 AM  . Balsam [place]-Castor Oil (VENELEX) OINT Apply topically to sacrum and bilateral buttocks every shift and as needed  . Brinzolamide-Brimonidine (SIMBRINZA) 1-0.2 % SUSP One  drop into both eyes three times a day   . Calcium Carbonate-Vitamin D (CALCIUM-VITAMIN D) 500-200 MG-UNIT tablet Take 1 tablet by mouth 2 (two) times daily.  . citalopram (CELEXA) 10 MG tablet Take 10 mg by mouth daily.  . clotrimazole (LOTRIMIN AF) 1 % cream Apply 1 application topically 2 (two) times daily. till area on left side of face is resolved  . cycloSPORINE (RESTASIS) 0.05 % ophthalmic emulsion Place 1 drop into both eyes 2 (two) times daily.   Marland Kitchen docusate sodium (COLACE) 100 MG capsule Take 100 mg by mouth 2 (two) times daily.  Marland Kitchen donepezil (ARICEPT) 5 MG tablet Take 5 mg by mouth daily. For dementia  . hydrALAZINE (APRESOLINE) 50 MG tablet Take 50 mg by mouth 2 (two) times a day.   . hydrochlorothiazide (HYDRODIURIL) 12.5 MG tablet Take 12.5 mg by mouth daily.   Marland Kitchen ipratropium-albuterol (DUONEB) 0.5-2.5 (3) MG/3ML SOLN Take 3 mLs by nebulization every 6 (six) hours as needed.  . loratadine (CLARITIN) 10 MG tablet Take 10 mg by mouth daily.  Marland Kitchen LORazepam (ATIVAN) 0.5 MG tablet Take 0.5 tablets (0.25 mg total) by mouth daily.  . nitroGLYCERIN (NITROSTAT) 0.4 MG SL tablet Place 0.4 mg under the tongue every 5 (five) minutes as needed for chest pain.   . NON FORMULARY Diet Type:  Regular, NAS  . OXYGEN Inhale 2 L/min into the lungs continuous. Wean to keep sats  above 90%  . pantoprazole (PROTONIX) 40 MG tablet Take 40 mg by mouth daily. Do not crush.   . potassium chloride (K-DUR,KLOR-CON) 10 MEQ tablet Take 10 mEq by mouth daily.  . Saline (OCEAN NASAL SPRAY NA) Place 1 spray into the nose every 2 (two) hours as needed.  . Travoprost, BAK Free, (TRAVATAN) 0.004 % SOLN ophthalmic solution Place 1 drop into both eyes at bedtime.  For glaucoma   No facility-administered encounter medications on file as of 12/05/2019.     SIGNIFICANT DIAGNOSTIC EXAMS  PREVIOUS ;   07-22-19: right forearm x-ray  1. Impacted fracture of the radial neck junction. 2. Suspect additional nondisplaced fracture of the coronoid process. 3. Right oval swelling and trace joint effusion. 4. Degenerative arthrosis of the wrist and elbow. Enthesopathic changes of the olecranon.  07-22-19: right shoulder x-ray  1. The osseous structures appear diffusely demineralized which may limit detection of small or nondisplaced fractures. 2. No acute fracture or traumatic malalignment. 3. High-riding appearance of the humeral head likely reflecting rotator cuff insufficiency. 4. Moderate osteoarthrosis of the acromioclavicular and glenohumeral joints.   NO NEW EXAMS.   LABS REVIEWED PREVIOUS:   03-11-19: wbc 4.7; hgb 11.4; hct 36.7; mcv 83.4; plt 83.4; plt 211; glucose 87; bun 44; creat 1.32; k+ 3.8; na++ 139; ca 9.4; liver normal albumin 4.0  09-06-19: wbc 4.4; hgb 10.8; hct 34.9; mcv 84.7 plt 261; glucose 81; bun 26; creat 1.21; k+ 3.5; na++ 141; ca 9.1 liver normal albumin 3.5     NO NEW LABS.   Review of Systems  Constitutional: Negative for malaise/fatigue.  Respiratory: Negative for cough and shortness of breath.   Cardiovascular: Negative for chest pain, palpitations and leg swelling.  Gastrointestinal: Negative for abdominal pain, constipation and heartburn.  Musculoskeletal: Negative for back pain, joint pain and myalgias.  Skin: Negative.   Neurological: Negative for  dizziness.  Psychiatric/Behavioral: The patient is not nervous/anxious.    Physical Exam Constitutional:      General: She is not in acute distress.    Appearance: She is well-developed. She is obese. She is not diaphoretic.  Eyes:     Comments: History of bilateral cataracts with implants      Neck:     Thyroid: No thyromegaly.  Cardiovascular:     Rate and Rhythm: Normal rate and regular rhythm.     Heart sounds: Normal heart sounds.     Comments: Pulses faint  Pulmonary:     Effort: Pulmonary effort is normal. No respiratory distress.     Breath sounds: Normal breath sounds.  Abdominal:     General: Bowel sounds are normal. There is no distension.     Palpations: Abdomen is soft.     Tenderness: There is no abdominal tenderness.  Musculoskeletal:        General: Normal range of motion.     Cervical back: Neck supple.  Lymphadenopathy:     Cervical: No cervical adenopathy.  Skin:    General: Skin is warm and dry.  Neurological:     Mental Status: She is alert and oriented to person, place, and time.  Psychiatric:        Mood and Affect: Mood normal.       ASSESSMENT/ PLAN:  TODAY  1. Chronic diastolic CHF (congestive heart failure) 2. Late onset alzheimer's disease with behavioral disturbance 3. Stage 3b chronic kidney disease  Will continue current medications Will continue current plan of care Will continue to monitor her status.   MD is aware of resident's narcotic use and is in agreement with current plan of care. We will attempt to wean resident as appropriate.  Ok Edwards NP [facility]  Contact [phone] Monday through Friday 8am- 5pm  After hours call [phone]

## 2019-12-06 ENCOUNTER — Non-Acute Institutional Stay (SKILLED_NURSING_FACILITY): Payer: Medicare Other | Admitting: Adult Health

## 2019-12-06 ENCOUNTER — Encounter: Payer: Self-pay | Admitting: Adult Health

## 2019-12-06 DIAGNOSIS — J9611 Chronic respiratory failure with hypoxia: Secondary | ICD-10-CM | POA: Diagnosis not present

## 2019-12-06 DIAGNOSIS — E876 Hypokalemia: Secondary | ICD-10-CM

## 2019-12-06 DIAGNOSIS — Z9981 Dependence on supplemental oxygen: Secondary | ICD-10-CM

## 2019-12-06 DIAGNOSIS — H40053 Ocular hypertension, bilateral: Secondary | ICD-10-CM

## 2019-12-06 NOTE — Progress Notes (Signed)
Location:    [place] Room Number: 122/D Place of Service:  SNF (31)   CODE STATUS: DNR  Allergies  Allergen Reactions  . Bee Venom Anaphylaxis  . Ace Inhibitors   . Angiotensin Receptor Blockers     Tongue swelling.  . Aspirin     Directed by physician  . Cabbage Other (See Comments)    Certain foods due to gout flares  . Latex Rash    Chief Complaint  Patient presents with  . Medical Management of Chronic Issues        chronic respiratory failure with hypoxia on home 02 therapy. Increased intraocular pressure bilateral:   Hypokalemia     HPI:  She is a 86 year old long term resident of this facility being seen for the management of her chronic illnesses: respiratory failure; hypokalemia; glaucoma. There are no reports of uncontrolled pain; no changes in appetite; no cough or shortness of breath; she will decline her 02.   Past Medical History:  Diagnosis Date  . Anxiety   . Cellulitis 2016  . CHF (congestive heart failure) ([place])   . Chronic kidney disease   . Dementia ([place])   . Dysphagia   . Gait abnormality   . GERD (gastroesophageal reflux disease)   . Glaucoma   . Gout   . Gout, unspecified 01/12/2010   Qualifier: Diagnosis of  By: Moshe Cipro MD, Joycelyn Schmid    . Hyperlipidemia   . Hypertension   . Muscle weakness   . Myocardial infarction [facility]) [place]  . Osteoarthritis   . Syncope     Past Surgical History:  Procedure Laterality Date  . APPENDECTOMY    . CATARACT EXTRACTION W/PHACO  08/04/2011   Procedure: CATARACT EXTRACTION PHACO AND INTRAOCULAR LENS PLACEMENT (IOC);  Surgeon: Tonny Branch;  Location: [facility]S;  Service: Ophthalmology;  Laterality: Right;  CDE=18.53  . CATARACT EXTRACTION W/PHACO  08/25/2011   Procedure: CATARACT EXTRACTION PHACO AND INTRAOCULAR LENS PLACEMENT (IOC);  Surgeon: Tonny Branch;  Location: [facility];  Service: Ophthalmology;  Laterality: Left;  CDE:14.76  . TONSILLECTOMY      Social History    Socioeconomic History  . Marital status: Divorced    Spouse name: Not on file  . Number of children: Not on file  . Years of education: Not on file  . Highest education level: Not on file  Occupational History  . Occupation: retired   Tobacco Use  . Smoking status: Never Smoker  . Smokeless tobacco: Never Used  Substance and Sexual Activity  . Alcohol use: No  . Drug use: No  . Sexual activity: Not Currently  Other Topics Concern  . Not on file  Social History Narrative   Long term resident [facility]    Social Determinants of Health   Financial Resource Strain:   . Difficulty of Paying Living Expenses:   Food Insecurity:   . Worried About Charity fundraiser in the Last Year:   . Arboriculturist in the Last Year:   Transportation Needs:   . Film/video editor (Medical):   Marland Kitchen Lack of Transportation (Non-Medical):   Physical Activity:   . Days of Exercise per Week:   . Minutes of Exercise per Session:   Stress:   . Feeling of Stress :   Social Connections:   . Frequency of Communication with Friends and Family:   . Frequency of Social Gatherings with Friends and Family:   . Attends  Religious Services:   . Active Member of Clubs or Organizations:   . Attends Archivist Meetings:   Marland Kitchen Marital Status:   Intimate Partner Violence:   . Fear of Current or Ex-Partner:   . Emotionally Abused:   Marland Kitchen Physically Abused:   . Sexually Abused:    Family History  Problem Relation Age of Onset  . Diabetes Father   . Diabetes Sister   . Diabetes Brother   . Stroke Sister   . Stroke Sister   . Anesthesia problems Neg Hx   . Hypotension Neg Hx   . Malignant hyperthermia Neg Hx   . Pseudochol deficiency Neg Hx       VITAL SIGNS BP (!) 149/77   Pulse 62   Temp 98.8 F (37.1 C) (Oral)   Resp 18   Ht 4\' 11"  (1.499 m)   Wt 160 lb (72.6 kg)   SpO2 95%   BMI 32.32 kg/m   Outpatient Encounter Medications as of 12/06/2019  Medication Sig  . acetaminophen (TYLENOL)  500 MG tablet Take 1,000 mg by mouth every 8 (eight) hours as needed. For leg pain and arthritis. May have additional 1000 mg for break through pain once a day prn  . alum & mag hydroxide-simeth (MAALOX PLUS) 400-400-40 MG/5ML suspension Take 20 mLs by mouth 2 (two) times daily as needed for indigestion.  . Alum & Mag Hydroxide-Simeth (MYLANTA PO) Take by mouth. liquid; regular; amt: 4 tsp.; oral Special Instructions: after breakfast Once A Day 09:00 AM  . Balsam [place]-Castor Oil (VENELEX) OINT Apply topically to sacrum and bilateral buttocks every shift and as needed  . Brinzolamide-Brimonidine (SIMBRINZA) 1-0.2 % SUSP One  drop into both eyes three times a day   . Calcium Carbonate-Vitamin D (CALCIUM-VITAMIN D) 500-200 MG-UNIT tablet Take 1 tablet by mouth 2 (two) times daily.  . citalopram (CELEXA) 10 MG tablet Take 20 mg by mouth daily.   . clotrimazole (LOTRIMIN AF) 1 % cream Apply 1 application topically 2 (two) times daily. till area on left side of face is resolved  . cycloSPORINE (RESTASIS) 0.05 % ophthalmic emulsion Place 1 drop into both eyes 2 (two) times daily.   Marland Kitchen docusate sodium (COLACE) 100 MG capsule Take 100 mg by mouth 2 (two) times daily.  Marland Kitchen donepezil (ARICEPT) 5 MG tablet Take 5 mg by mouth daily. For dementia  . hydrALAZINE (APRESOLINE) 50 MG tablet Take 50 mg by mouth 2 (two) times a day.   . hydrochlorothiazide (HYDRODIURIL) 12.5 MG tablet Take 12.5 mg by mouth daily.   Marland Kitchen ipratropium-albuterol (DUONEB) 0.5-2.5 (3) MG/3ML SOLN Take 3 mLs by nebulization every 6 (six) hours as needed.  . loratadine (CLARITIN) 10 MG tablet Take 10 mg by mouth daily.  Marland Kitchen LORazepam (ATIVAN) 0.5 MG tablet Take 0.5 tablets (0.25 mg total) by mouth daily.  . nitroGLYCERIN (NITROSTAT) 0.4 MG SL tablet Place 0.4 mg under the tongue every 5 (five) minutes as needed for chest pain.   . NON FORMULARY Diet Type:  Regular, NAS  . OXYGEN Inhale 2 L/min into the lungs continuous. Wean to keep sats above 90%   . pantoprazole (PROTONIX) 40 MG tablet Take 40 mg by mouth daily. Do not crush.   . potassium chloride (K-DUR,KLOR-CON) 10 MEQ tablet Take 10 mEq by mouth daily.  . Saline (OCEAN NASAL SPRAY NA) Place 1 spray into the nose every 2 (two) hours as needed.  . Travoprost, BAK Free, (TRAVATAN) 0.004 % SOLN ophthalmic solution Place  1 drop into both eyes at bedtime. For glaucoma   No facility-administered encounter medications on file as of 12/06/2019.     SIGNIFICANT DIAGNOSTIC EXAMS   PREVIOUS ;   07-22-19: right forearm x-ray  1. Impacted fracture of the radial neck junction. 2. Suspect additional nondisplaced fracture of the coronoid process. 3. Right oval swelling and trace joint effusion. 4. Degenerative arthrosis of the wrist and elbow. Enthesopathic changes of the olecranon.  07-22-19: right shoulder x-ray  1. The osseous structures appear diffusely demineralized which may limit detection of small or nondisplaced fractures. 2. No acute fracture or traumatic malalignment. 3. High-riding appearance of the humeral head likely reflecting rotator cuff insufficiency. 4. Moderate osteoarthrosis of the acromioclavicular and glenohumeral joints.   NO NEW EXAMS.   LABS REVIEWED PREVIOUS:   03-11-19: wbc 4.7; hgb 11.4; hct 36.7; mcv 83.4; plt 83.4; plt 211; glucose 87; bun 44; creat 1.32; k+ 3.8; na++ 139; ca 9.4; liver normal albumin 4.0  09-06-19: wbc 4.4; hgb 10.8; hct 34.9; mcv 84.7 plt 261; glucose 81; bun 26; creat 1.21; k+ 3.5; na++ 141; ca 9.1 liver normal albumin 3.5     NO NEW LABS.   Review of Systems  Constitutional: Negative for malaise/fatigue.  Respiratory: Negative for cough and shortness of breath.   Cardiovascular: Negative for chest pain, palpitations and leg swelling.  Gastrointestinal: Negative for abdominal pain, constipation and heartburn.  Musculoskeletal: Negative for back pain, joint pain and myalgias.  Skin: Negative.   Neurological: Negative for dizziness.    Psychiatric/Behavioral: The patient is not nervous/anxious.     Physical Exam Constitutional:      General: She is not in acute distress.    Appearance: She is well-developed. She is obese. She is not diaphoretic.  Eyes:     Comments: History of bilateral cataracts with implants       Neck:     Thyroid: No thyromegaly.  Cardiovascular:     Rate and Rhythm: Normal rate and regular rhythm.     Heart sounds: Normal heart sounds.     Comments: Pulses faint  Pulmonary:     Effort: Pulmonary effort is normal. No respiratory distress.     Breath sounds: Normal breath sounds.  Abdominal:     General: Bowel sounds are normal. There is no distension.     Palpations: Abdomen is soft.     Tenderness: There is no abdominal tenderness.  Musculoskeletal:        General: Normal range of motion.     Cervical back: Neck supple.     Right lower leg: No edema.     Left lower leg: No edema.  Lymphadenopathy:     Cervical: No cervical adenopathy.  Skin:    General: Skin is warm and dry.  Neurological:     Mental Status: She is alert and oriented to person, place, and time.  Psychiatric:        Mood and Affect: Mood normal.       ASSESSMENT/ PLAN:  TODAY:   1.chronic respiratory failure with hypoxia on home 02 therapy. Is stable will continue claritin 10 mg daily   2. Increased intraocular pressure bilateral: is stable will continue simbrinza three times daily and travatan nightly to both eyes.   3. Hypokalemia is stable will continue k+ 10 meq daily    PREVIOUS  4. Vascular dementia with behavioral disturbance. No significant change in status: weight is 161 pounds; albumin 3.9; will continue aricept 5 mg daily. She is  slowly losing weight which is an unfortunate but expected outcome at the late stages of this disease process.   5. Primary osteoarthritis multiple joints: is stable will continue tylenol 1 gm twice daily   6. CKD (chronic kidney disease) stage 3 GFR 30-59 ml/min is  stable bun 27; creat 1.09  7. Chronic constipation; will continue colace twice daily   8. Depression with anxiety: is stable will continue celexa 10 mg daily and ativan 0.25 mg daily   9. Chronic diastolic CHF (congestive heart failure) EF 65-70% (03-04-18) will continue apresoline 50 mg twice daily and has prn ntg   10. Deep vein thrombosis (DVT) of left lower extremity unspecified chronicity unspecified vein: is stable will continue to monitor her status.   11Chronic non-seasonal allergic rhinitis: is stable will continue claritin 10 mg daily   12. 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 GERD without esophagitis: is stable will continue protonic 40 mg daily     MD is aware of resident's narcotic use and is in agreement with current plan of care. We will attempt to wean resident as appropriate.  Ok Edwards NP [facility]  Contact [phone] Monday through Friday 8am- 5pm  After hours call [phone]

## 2019-12-09 DIAGNOSIS — E876 Hypokalemia: Secondary | ICD-10-CM | POA: Insufficient documentation

## 2019-12-11 DIAGNOSIS — F428 Other obsessive-compulsive disorder: Secondary | ICD-10-CM | POA: Diagnosis not present

## 2019-12-11 DIAGNOSIS — F320 Major depressive disorder, single episode, mild: Secondary | ICD-10-CM | POA: Diagnosis not present

## 2019-12-11 DIAGNOSIS — F411 Generalized anxiety disorder: Secondary | ICD-10-CM | POA: Diagnosis not present

## 2019-12-16 ENCOUNTER — Other Ambulatory Visit: Payer: Self-pay | Admitting: Adult Health

## 2019-12-16 MED ORDER — LORAZEPAM 0.5 MG PO TABS: 0.2500 mg | ORAL_TABLET | Freq: Every day | ORAL | 0 refills | Status: DC

## 2019-12-18 DIAGNOSIS — F320 Major depressive disorder, single episode, mild: Secondary | ICD-10-CM | POA: Diagnosis not present

## 2019-12-18 DIAGNOSIS — F411 Generalized anxiety disorder: Secondary | ICD-10-CM | POA: Diagnosis not present

## 2019-12-18 DIAGNOSIS — F428 Other obsessive-compulsive disorder: Secondary | ICD-10-CM | POA: Diagnosis not present

## 2019-12-20 ENCOUNTER — Encounter: Payer: Self-pay | Admitting: Adult Health

## 2019-12-20 ENCOUNTER — Non-Acute Institutional Stay (SKILLED_NURSING_FACILITY): Payer: Medicare Other | Admitting: Adult Health

## 2019-12-20 DIAGNOSIS — Z0000 Encounter for general adult medical examination without abnormal findings: Secondary | ICD-10-CM | POA: Diagnosis not present

## 2019-12-20 NOTE — Progress Notes (Signed)
Subjective:   [name] is a 86 y.o. female who presents for Medicare Annual (Subsequent) preventive examination. Long term resident of [facility]   Review of Systems:   Review of Systems  Constitutional: Negative for malaise/fatigue.  Respiratory: Negative for cough and shortness of breath.   Cardiovascular: Negative for chest pain, palpitations and leg swelling.  Gastrointestinal: Negative for abdominal pain, constipation and heartburn.  Musculoskeletal: Negative for back pain, joint pain and myalgias.  Skin: Negative.   Neurological: Negative for dizziness.  Psychiatric/Behavioral: The patient is not nervous/anxious.    Cardiac Risk Factors include: advanced age (>58men, >46 women);obesity (BMI >30kg/m2);sedentary lifestyle     Objective:     Vitals: BP (!) 151/71   Pulse (!) 53   Temp 99 F (37.2 C) (Oral)   Resp 17   Ht 4\' 11"  (1.499 m)   Wt 160 lb (72.6 kg)   SpO2 100%   BMI 32.32 kg/m   Body mass index is 32.32 kg/m.  Advanced Directives 12/20/2019 12/06/2019 12/05/2019 11/06/2019 10/31/2019 10/04/2019 09/05/2019  Does Patient Have a Medical Advance Directive? Yes Yes Yes Yes Yes Yes Yes  Type of Advance Directive Out of facility DNR (pink MOST or yellow form) Out of facility DNR (pink MOST or yellow form) Out of facility DNR (pink MOST or yellow form) Out of facility DNR (pink MOST or yellow form) Out of facility DNR (pink MOST or yellow form) Out of facility DNR (pink MOST or yellow form) Out of facility DNR (pink MOST or yellow form)  Does patient want to make changes to medical advance directive? No - Patient declined No - Patient declined No - Patient declined No - Patient declined No - Patient declined No - Patient declined No - Patient declined  Copy of [place] in [place]  Would patient like information on creating a medical advance directive? - - - - - - -  Pre-existing out of facility DNR order (yellow form or pink MOST form) Yellow  form placed in chart (order not valid for inpatient use) Yellow form placed in chart (order not valid for inpatient use) Yellow form placed in chart (order not valid for inpatient use) Yellow form placed in chart (order not valid for inpatient use) Yellow form placed in chart (order not valid for inpatient use) Yellow form placed in chart (order not valid for inpatient use) Yellow form placed in chart (order not valid for inpatient use)    Tobacco Social History   Tobacco Use  Smoking Status Never Smoker  Smokeless Tobacco Never Used     Counseling given: Not Answered   Clinical Intake:  Pre-visit preparation completed: Yes  Pain : No/denies pain     BMI - recorded: 32.32 Nutritional Status: BMI > 30  Obese Diabetes: No     Interpreter Needed?: No     Past Medical History:  Diagnosis Date  . Anxiety   . Cellulitis 2016  . CHF (congestive heart failure) ([place])   . Chronic kidney disease   . Dementia ([place])   . Dysphagia   . Gait abnormality   . GERD (gastroesophageal reflux disease)   . Glaucoma   . Gout   . Gout, unspecified 01/12/2010   Qualifier: Diagnosis of  By: Moshe Cipro MD, Joycelyn Schmid    . Hyperlipidemia   . Hypertension   . Muscle weakness   . Myocardial infarction [facility]) [place]  . Osteoarthritis   .  Syncope    Past Surgical History:  Procedure Laterality Date  . APPENDECTOMY    . CATARACT EXTRACTION W/PHACO  08/04/2011   Procedure: CATARACT EXTRACTION PHACO AND INTRAOCULAR LENS PLACEMENT (IOC);  Surgeon: Tonny Branch;  Location: [facility];  Service: Ophthalmology;  Laterality: Right;  CDE=18.53  . CATARACT EXTRACTION W/PHACO  08/25/2011   Procedure: CATARACT EXTRACTION PHACO AND INTRAOCULAR LENS PLACEMENT (IOC);  Surgeon: Tonny Branch;  Location: [facility];  Service: Ophthalmology;  Laterality: Left;  CDE:14.76  . TONSILLECTOMY     Family History  Problem Relation Age of Onset  . Diabetes Father   . Diabetes Sister   . Diabetes Brother   . Stroke  Sister   . Stroke Sister   . Anesthesia problems Neg Hx   . Hypotension Neg Hx   . Malignant hyperthermia Neg Hx   . Pseudochol deficiency Neg Hx    Social History   Socioeconomic History  . Marital status: Divorced    Spouse name: Not on file  . Number of children: Not on file  . Years of education: Not on file  . Highest education level: Not on file  Occupational History  . Occupation: retired   Tobacco Use  . Smoking status: Never Smoker  . Smokeless tobacco: Never Used  Substance and Sexual Activity  . Alcohol use: No  . Drug use: No  . Sexual activity: Not Currently  Other Topics Concern  . Not on file  Social History Narrative   Long term resident [facility]    Social Determinants of Health   Financial Resource Strain:   . Difficulty of Paying Living Expenses:   Food Insecurity:   . Worried About Charity fundraiser in the Last Year:   . Arboriculturist in the Last Year:   Transportation Needs:   . Film/video editor (Medical):   Marland Kitchen Lack of Transportation (Non-Medical):   Physical Activity:   . Days of Exercise per Week:   . Minutes of Exercise per Session:   Stress:   . Feeling of Stress :   Social Connections:   . Frequency of Communication with Friends and Family:   . Frequency of Social Gatherings with Friends and Family:   . Attends Religious Services:   . Active Member of Clubs or Organizations:   . Attends Archivist Meetings:   Marland Kitchen Marital Status:     Outpatient Encounter Medications as of 12/20/2019  Medication Sig  . acetaminophen (TYLENOL) 500 MG tablet Take 1,000 mg by mouth every 8 (eight) hours as needed. For leg pain and arthritis. May have additional 1000 mg for break through pain once a day prn  . alum & mag hydroxide-simeth (MAALOX PLUS) 400-400-40 MG/5ML suspension Take 5 mLs by mouth 2 (two) times daily as needed for indigestion.   . Alum & Mag Hydroxide-Simeth (MYLANTA PO) Take by mouth. liquid; regular; amt: 4 tsp.; oral Special  Instructions: after breakfast Once A Day 09:00 AM  . Balsam [place]-Castor Oil (VENELEX) OINT Apply topically to sacrum and bilateral buttocks every shift and as needed  . Brinzolamide-Brimonidine (SIMBRINZA) 1-0.2 % SUSP One  drop into both eyes three times a day   . Calcium Carbonate-Vitamin D (CALCIUM-VITAMIN D) 500-200 MG-UNIT tablet Take 1 tablet by mouth 2 (two) times daily.  . citalopram (CELEXA) 10 MG tablet Take 20 mg by mouth daily.   . clotrimazole (LOTRIMIN AF) 1 % cream Apply 1 application topically 2 (two) times daily. till area on left  side of face is resolved  . cycloSPORINE (RESTASIS) 0.05 % ophthalmic emulsion Place 1 drop into both eyes 2 (two) times daily.   Marland Kitchen docusate sodium (COLACE) 100 MG capsule Take 100 mg by mouth 2 (two) times daily as needed.   . donepezil (ARICEPT) 5 MG tablet Take 5 mg by mouth daily. For dementia  . hydrALAZINE (APRESOLINE) 50 MG tablet Take 50 mg by mouth 2 (two) times a day.   . hydrochlorothiazide (HYDRODIURIL) 12.5 MG tablet Take 12.5 mg by mouth daily.   Marland Kitchen ipratropium-albuterol (DUONEB) 0.5-2.5 (3) MG/3ML SOLN Take 3 mLs by nebulization every 6 (six) hours as needed.  . loratadine (CLARITIN) 10 MG tablet Take 10 mg by mouth daily.  Marland Kitchen LORazepam (ATIVAN) 0.5 MG tablet Take 0.5 tablets (0.25 mg total) by mouth daily.  . nitroGLYCERIN (NITROSTAT) 0.4 MG SL tablet Place 0.4 mg under the tongue every 5 (five) minutes as needed for chest pain.   . NON FORMULARY Diet Type:  Regular, NAS  . OXYGEN Inhale 2 L/min into the lungs continuous. Wean to keep sats above 90%  . pantoprazole (PROTONIX) 40 MG tablet Take 40 mg by mouth daily. Do not crush.   . potassium chloride (K-DUR,KLOR-CON) 10 MEQ tablet Take 10 mEq by mouth daily.  . Saline (OCEAN NASAL SPRAY NA) Place 1 spray into the nose every 2 (two) hours as needed.  . Travoprost, BAK Free, (TRAVATAN) 0.004 % SOLN ophthalmic solution Place 1 drop into both eyes at bedtime. For glaucoma   No  facility-administered encounter medications on file as of 12/20/2019.    Activities of Daily Living In your present state of health, do you have any difficulty performing the following activities: 12/20/2019 07/24/2019  Hearing? N Y  Vision? N N  Difficulty concentrating or making decisions? Tempie Donning  Walking or climbing stairs? Y Y  Dressing or bathing? Y Y  Doing errands, shopping? Y -  Conservation officer, nature and eating ? Y -  Using the Toilet? Y -  In the past six months, have you accidently leaked urine? Y -  Do you have problems with loss of bowel control? Y -  Managing your Medications? Y -  Managing your Finances? Y -  Some recent data might be hidden    Patient Care Team: Hennie Duos, MD as PCP - General (Internal Medicine) Nyoka Cowden Phylis Bougie, NP as Nurse Practitioner ([place] [place] [facility])    Assessment:   This is a routine wellness examination for [place].  Exercise Activities and Dietary recommendations Current Exercise Habits: The patient does not participate in regular exercise at present  Goals    . DIET - INCREASE WATER INTAKE    . Follow up with Primary Care Provider    . General - Client will not be readmitted within 30 days (C-SNP)       Fall Risk Fall Risk  12/20/2019 07/24/2019 06/09/2019 05/31/2018 05/22/2017  Falls in the past year? 1 1 0 No No  Number falls in past yr: 0 1 - - -  Injury with Fall? 1 1 - - -  Risk for fall due to : History of fall(s);Impaired balance/gait;Impaired mobility History of fall(s);Impaired balance/gait;Impaired mobility - - -  Follow up Falls evaluation completed Falls evaluation completed - - -   Is the patient's home free of loose throw rugs in walkways, pet beds, electrical cords, etc?   yes      Grab bars in the bathroom? yes  Handrails on the stairs?   n/a      Adequate lighting?   yes  Timed Get Up and Go performed: unable to perform   Depression Screen PHQ 2/9 Scores  12/20/2019 07/24/2019 06/09/2019 05/31/2018  PHQ - 2 Score 0 0 0 0     Cognitive Function     6CIT Screen 12/20/2019 05/31/2018 05/22/2017  What Year? 0 points 4 points 0 points  What month? 0 points 0 points 0 points  What time? 0 points 0 points 0 points  Count back from 20 2 points 0 points 0 points  Months in reverse 2 points 4 points 0 points  Repeat phrase 2 points 4 points 6 points  Total Score 6 12 6     Immunization History  Administered Date(s) Administered  . Influenza Whole 06/11/2007, 10/16/2008, 05/21/2009, 05/05/2010  . Influenza-Unspecified 06/05/2014, 06/02/2016, 06/02/2017, 05/31/2018, 06/03/2019  . Pneumococcal Conjugate-13 06/06/2016  . Pneumococcal Polysaccharide-23 06/11/2007, 06/01/2016, 03/04/2018  . Td 06/11/2007  . Tdap 06/24/2017  . Zoster 05/21/2007    Qualifies for Shingles Vaccine?long term resident of [facility]   Screening Tests Health Maintenance  Topic Date Due  . COVID-19 Vaccine (1) Never done  . INFLUENZA VACCINE  03/29/2020  . TETANUS/TDAP  06/25/2027  . DEXA SCAN  Completed  . PNA vac Low Risk Adult  Completed    Cancer Screenings: Lung: Low Dose CT Chest recommended if Age 50-80 years, 30 pack-year currently smoking OR have quit w/in 15years. Patient does not  qualify. Breast:  Up to date on Mammogram? n/a   Up to date of Bone Density/Dexa? n/a Colorectal: n/a  Additional Screenings: n/a: Hepatitis C Screening:      Plan:     I have personally reviewed and noted the following in the patient's chart:   . Medical and social history . Use of alcohol, tobacco or illicit drugs  . Current medications and supplements . Functional ability and status . Nutritional status . Physical activity . Advanced directives . List of other physicians . Hospitalizations, surgeries, and ER visits in previous 12 months . Vitals . Screenings to include cognitive, depression, and falls . Referrals and appointments  In addition, I have reviewed and  discussed with patient certain preventive protocols, quality metrics, and best practice recommendations. A written personalized care plan for preventive services as well as general preventive health recommendations were provided to patient.     Gerlene Fee, NP  12/20/2019

## 2019-12-20 NOTE — Patient Instructions (Signed)
  Ms. Windon , Thank you for taking time to come for your Medicare Wellness Visit. I appreciate your ongoing commitment to your health goals. Please review the following plan we discussed and let me know if I can assist you in the future.   These are the goals we discussed: Goals    . DIET - INCREASE WATER INTAKE    . Follow up with Primary Care Provider    . General - Client will not be readmitted within 30 days (C-SNP)       This is a list of the screening recommended for you and due dates:  Health Maintenance  Topic Date Due  . COVID-19 Vaccine (1) Never done  . Flu Shot  03/29/2020  . Tetanus Vaccine  06/25/2027  . DEXA scan (bone density measurement)  Completed  . Pneumonia vaccines  Completed

## 2019-12-25 DIAGNOSIS — F428 Other obsessive-compulsive disorder: Secondary | ICD-10-CM | POA: Diagnosis not present

## 2019-12-25 DIAGNOSIS — F320 Major depressive disorder, single episode, mild: Secondary | ICD-10-CM | POA: Diagnosis not present

## 2019-12-25 DIAGNOSIS — F411 Generalized anxiety disorder: Secondary | ICD-10-CM | POA: Diagnosis not present

## 2019-12-25 DIAGNOSIS — Z1159 Encounter for screening for other viral diseases: Secondary | ICD-10-CM | POA: Diagnosis not present

## 2019-12-25 DIAGNOSIS — M79601 Pain in right arm: Secondary | ICD-10-CM | POA: Diagnosis not present

## 2019-12-26 DIAGNOSIS — I739 Peripheral vascular disease, unspecified: Secondary | ICD-10-CM | POA: Diagnosis not present

## 2019-12-26 DIAGNOSIS — M2141 Flat foot [pes planus] (acquired), right foot: Secondary | ICD-10-CM | POA: Diagnosis not present

## 2019-12-26 DIAGNOSIS — M2142 Flat foot [pes planus] (acquired), left foot: Secondary | ICD-10-CM | POA: Diagnosis not present

## 2019-12-26 DIAGNOSIS — B351 Tinea unguium: Secondary | ICD-10-CM | POA: Diagnosis not present

## 2020-01-02 DIAGNOSIS — F320 Major depressive disorder, single episode, mild: Secondary | ICD-10-CM | POA: Diagnosis not present

## 2020-01-02 DIAGNOSIS — F411 Generalized anxiety disorder: Secondary | ICD-10-CM | POA: Diagnosis not present

## 2020-01-02 DIAGNOSIS — F428 Other obsessive-compulsive disorder: Secondary | ICD-10-CM | POA: Diagnosis not present

## 2020-01-06 ENCOUNTER — Non-Acute Institutional Stay (SKILLED_NURSING_FACILITY): Payer: Medicare Other | Admitting: Adult Health

## 2020-01-06 ENCOUNTER — Encounter: Payer: Self-pay | Admitting: Adult Health

## 2020-01-06 DIAGNOSIS — G301 Alzheimer's disease with late onset: Secondary | ICD-10-CM

## 2020-01-06 DIAGNOSIS — N1832 Chronic kidney disease, stage 3b: Secondary | ICD-10-CM | POA: Diagnosis not present

## 2020-01-06 DIAGNOSIS — F0281 Dementia in other diseases classified elsewhere with behavioral disturbance: Secondary | ICD-10-CM | POA: Diagnosis not present

## 2020-01-06 DIAGNOSIS — M8949 Other hypertrophic osteoarthropathy, multiple sites: Secondary | ICD-10-CM

## 2020-01-06 DIAGNOSIS — M159 Polyosteoarthritis, unspecified: Secondary | ICD-10-CM

## 2020-01-06 DIAGNOSIS — F02818 Dementia in other diseases classified elsewhere, unspecified severity, with other behavioral disturbance: Secondary | ICD-10-CM

## 2020-01-06 NOTE — Progress Notes (Signed)
Location:    [facility] Room Number: 122/D Place of Service:  SNF (31)   CODE STATUS: DNR  Allergies  Allergen Reactions  . Bee Venom Anaphylaxis  . Ace Inhibitors   . Angiotensin Receptor Blockers     Tongue swelling.  . Aspirin     Directed by physician  . Cabbage Other (See Comments)    Certain foods due to gout flares  . Latex Rash    Chief Complaint  Patient presents with  . Medical Management of Chronic Issues        Vascular dementia with behavioral disturbance:   Primary osteoarthritis multiple joints:  CKD (chronic kidney disease) stage 3    HPI:  She is a 86 year old long term resident of this facility being seen for the management of her chronic illnesses; dementia; osteoarthritis; ckd stage 3. There are no reports of uncontrolled pain; no changes in weight; no reports of anxiety or agitation.   Past Medical History:  Diagnosis Date  . Anxiety   . Cellulitis 2016  . CHF (congestive heart failure) ([place])   . Chronic kidney disease   . Dementia ([place])   . Dysphagia   . Gait abnormality   . GERD (gastroesophageal reflux disease)   . Glaucoma   . Gout   . Gout, unspecified 01/12/2010   Qualifier: Diagnosis of  By: Moshe Cipro MD, Joycelyn Schmid    . Hyperlipidemia   . Hypertension   . Muscle weakness   . Myocardial infarction [facility] [place]  . Osteoarthritis   . Syncope     Past Surgical History:  Procedure Laterality Date  . APPENDECTOMY    . CATARACT EXTRACTION W/PHACO  08/04/2011   Procedure: CATARACT EXTRACTION PHACO AND INTRAOCULAR LENS PLACEMENT (IOC);  Surgeon: Tonny Branch;  Location: [facility];  Service: Ophthalmology;  Laterality: Right;  CDE=18.53  . CATARACT EXTRACTION W/PHACO  08/25/2011   Procedure: CATARACT EXTRACTION PHACO AND INTRAOCULAR LENS PLACEMENT (IOC);  Surgeon: Tonny Branch;  Location: [facility];  Service: Ophthalmology;  Laterality: Left;  CDE:14.76  . TONSILLECTOMY      Social History   Socioeconomic History    . Marital status: Divorced    Spouse name: Not on file  . Number of children: Not on file  . Years of education: Not on file  . Highest education level: Not on file  Occupational History  . Occupation: retired   Tobacco Use  . Smoking status: Never Smoker  . Smokeless tobacco: Never Used  Substance and Sexual Activity  . Alcohol use: No  . Drug use: No  . Sexual activity: Not Currently  Other Topics Concern  . Not on file  Social History Narrative   Long term resident [facility]    Social Determinants of Health   Financial Resource Strain:   . Difficulty of Paying Living Expenses:   Food Insecurity:   . Worried About Charity fundraiser in the Last Year:   . Arboriculturist in the Last Year:   Transportation Needs:   . Film/video editor (Medical):   Marland Kitchen Lack of Transportation (Non-Medical):   Physical Activity:   . Days of Exercise per Week:   . Minutes of Exercise per Session:   Stress:   . Feeling of Stress :   Social Connections:   . Frequency of Communication with Friends and Family:   . Frequency of Social Gatherings with Friends and Family:   . Attends Religious Services:   .  Active Member of Clubs or Organizations:   . Attends Archivist Meetings:   Marland Kitchen Marital Status:   Intimate Partner Violence:   . Fear of Current or Ex-Partner:   . Emotionally Abused:   Marland Kitchen Physically Abused:   . Sexually Abused:    Family History  Problem Relation Age of Onset  . Diabetes Father   . Diabetes Sister   . Diabetes Brother   . Stroke Sister   . Stroke Sister   . Anesthesia problems Neg Hx   . Hypotension Neg Hx   . Malignant hyperthermia Neg Hx   . Pseudochol deficiency Neg Hx       VITAL SIGNS BP (!) 125/59   Pulse 69   Temp 98.1 F (36.7 C) (Oral)   Resp 17   Ht 4\' 11"  (1.499 m)   Wt 164 lb 3.2 oz (74.5 kg)   SpO2 100%   BMI 33.16 kg/m   Outpatient Encounter Medications as of 01/06/2020  Medication Sig  . acetaminophen (TYLENOL) 500 MG tablet  Take 1,000 mg by mouth every 8 (eight) hours as needed. For leg pain and arthritis. May have additional 1000 mg for break through pain once a day prn  . alum & mag hydroxide-simeth (MAALOX PLUS) 400-400-40 MG/5ML suspension Take 5 mLs by mouth 2 (two) times daily as needed for indigestion.   . Alum & Mag Hydroxide-Simeth (MYLANTA PO) Take by mouth. liquid; regular; amt: 4 tsp.; oral Special Instructions: after breakfast Once A Day 09:00 AM  . Balsam [place]-Castor Oil (VENELEX) OINT Apply topically to sacrum and bilateral buttocks every shift and as needed  . Brinzolamide-Brimonidine (SIMBRINZA) 1-0.2 % SUSP One  drop into both eyes three times a day   . Calcium Carbonate-Vitamin D (CALCIUM-VITAMIN D) 500-200 MG-UNIT tablet Take 1 tablet by mouth 2 (two) times daily.  . citalopram (CELEXA) 10 MG tablet Take 20 mg by mouth daily.   . cycloSPORINE (RESTASIS) 0.05 % ophthalmic emulsion Place 1 drop into both eyes 2 (two) times daily.   Marland Kitchen docusate sodium (COLACE) 100 MG capsule Take 100 mg by mouth 2 (two) times daily as needed.   . donepezil (ARICEPT) 5 MG tablet Take 5 mg by mouth daily. For dementia  . hydrALAZINE (APRESOLINE) 50 MG tablet Take 50 mg by mouth 2 (two) times a day.   . hydrochlorothiazide (HYDRODIURIL) 12.5 MG tablet Take 12.5 mg by mouth daily.   Marland Kitchen ipratropium-albuterol (DUONEB) 0.5-2.5 (3) MG/3ML SOLN Take 3 mLs by nebulization every 6 (six) hours as needed.  . latanoprost (XALATAN) 0.005 % ophthalmic solution Place 1 drop into both eyes at bedtime.  Marland Kitchen loratadine (CLARITIN) 10 MG tablet Take 10 mg by mouth daily.  Marland Kitchen LORazepam (ATIVAN) 0.5 MG tablet Take 0.5 tablets (0.25 mg total) by mouth daily.  . nitroGLYCERIN (NITROSTAT) 0.4 MG SL tablet Place 0.4 mg under the tongue every 5 (five) minutes as needed for chest pain.   . NON FORMULARY Diet Type:  Regular, NAS  . OXYGEN Inhale 2 L/min into the lungs continuous. Wean to keep sats above 90%  . pantoprazole (PROTONIX) 40 MG tablet  Take 40 mg by mouth daily. Do not crush.   . potassium chloride (K-DUR,KLOR-CON) 10 MEQ tablet Take 10 mEq by mouth daily.  . Saline (OCEAN NASAL SPRAY NA) Place 1 spray into the nose every 2 (two) hours as needed.  . [DISCONTINUED] clotrimazole (LOTRIMIN AF) 1 % cream Apply 1 application topically 2 (two) times daily. till area on  left side of face is resolved  . [DISCONTINUED] Travoprost, BAK Free, (TRAVATAN) 0.004 % SOLN ophthalmic solution Place 1 drop into both eyes at bedtime. For glaucoma   No facility-administered encounter medications on file as of 01/06/2020.     SIGNIFICANT DIAGNOSTIC EXAMS  PREVIOUS ;   07-22-19: right forearm x-ray  1. Impacted fracture of the radial neck junction. 2. Suspect additional nondisplaced fracture of the coronoid process. 3. Right oval swelling and trace joint effusion. 4. Degenerative arthrosis of the wrist and elbow. Enthesopathic changes of the olecranon.  07-22-19: right shoulder x-ray  1. The osseous structures appear diffusely demineralized which may limit detection of small or nondisplaced fractures. 2. No acute fracture or traumatic malalignment. 3. High-riding appearance of the humeral head likely reflecting rotator cuff insufficiency. 4. Moderate osteoarthrosis of the acromioclavicular and glenohumeral joints.   NO NEW EXAMS.   LABS REVIEWED PREVIOUS:   03-11-19: wbc 4.7; hgb 11.4; hct 36.7; mcv 83.4; plt 83.4; plt 211; glucose 87; bun 44; creat 1.32; k+ 3.8; na++ 139; ca 9.4; liver normal albumin 4.0  09-06-19: wbc 4.4; hgb 10.8; hct 34.9; mcv 84.7 plt 261; glucose 81; bun 26; creat 1.21; k+ 3.5; na++ 141; ca 9.1 liver normal albumin 3.5     NO NEW LABS.   Review of Systems  Constitutional: Negative for malaise/fatigue.  Respiratory: Negative for cough and shortness of breath.   Cardiovascular: Negative for chest pain, palpitations and leg swelling.  Gastrointestinal: Negative for abdominal pain, constipation and heartburn.    Musculoskeletal: Negative for back pain, joint pain and myalgias.  Skin: Negative.   Neurological: Negative for dizziness.  Psychiatric/Behavioral: The patient is not nervous/anxious.      Physical Exam Constitutional:      General: She is not in acute distress.    Appearance: She is well-developed. She is obese. She is not diaphoretic.  Eyes:     Comments:  History of bilateral cataracts with implants   Neck:     Thyroid: No thyromegaly.  Cardiovascular:     Rate and Rhythm: Normal rate and regular rhythm.     Heart sounds: Normal heart sounds.     Comments: Pedal pulses faint  Pulmonary:     Effort: Pulmonary effort is normal. No respiratory distress.     Breath sounds: Normal breath sounds.  Abdominal:     General: Bowel sounds are normal. There is no distension.     Palpations: Abdomen is soft.     Tenderness: There is no abdominal tenderness.  Musculoskeletal:        General: Normal range of motion.     Cervical back: Neck supple.     Right lower leg: No edema.     Left lower leg: No edema.  Lymphadenopathy:     Cervical: No cervical adenopathy.  Skin:    General: Skin is warm and dry.  Neurological:     Mental Status: She is alert and oriented to person, place, and time.  Psychiatric:        Mood and Affect: Mood normal.      ASSESSMENT/ PLAN:  TODAY:   1. Vascular dementia with behavioral disturbance: no significant change in status: weight is 164 pounds albumin 3.9 will continue aricept 5 mg daily her weight is stable at this time.   2. Primary osteoarthritis multiple joints: is stable will continue tylenol 1 gm twice daily   3. CKD (chronic kidney disease) stage 3 GFR 30-59 ml/min is stable bun 27; creat 1.09  PREVIOUS  4. Chronic constipation; will continue colace twice daily   5. Depression with anxiety: is stable will continue celexa 10 mg daily and ativan 0.25 mg daily   6. Chronic diastolic CHF (congestive heart failure) EF 65-70% (03-04-18)  will continue apresoline 50 mg twice daily and has prn ntg   7. Deep vein thrombosis (DVT) of left lower extremity unspecified chronicity unspecified vein: is stable will continue to monitor her status.   8. Chronic non-seasonal allergic rhinitis: is stable will continue claritin 10 mg daily   9. Hypertensive heart and kidney disease with chronic diastolic congestive heart failure and stage 3 chronic kidney disease: is stable b/p 125/59 will continue hctz 12.5 mg daily and apresoline 50 mg twice daily  10. GERD without esophagitis: is stable will continue protonic 40 mg daily    11.chronic respiratory failure with hypoxia on home 02 therapy. Is stable will continue claritin 10 mg daily   12. Increased intraocular pressure bilateral: is stable will continue simbrinza three times daily and travatan nightly to both eyes.   13. Hypokalemia is stable will continue k+ 10 meq daily        MD is aware of resident's narcotic use and is in agreement with current plan of care. We will attempt to wean resident as appropriate.  Ok Edwards NP [facility]  Contact [phone] Monday through Friday 8am- 5pm  After hours call [phone]

## 2020-01-08 ENCOUNTER — Other Ambulatory Visit: Payer: Self-pay | Admitting: Adult Health

## 2020-01-08 MED ORDER — LORAZEPAM 0.5 MG PO TABS: 0.2500 mg | ORAL_TABLET | Freq: Every day | ORAL | 0 refills | Status: DC

## 2020-01-10 DIAGNOSIS — F411 Generalized anxiety disorder: Secondary | ICD-10-CM | POA: Diagnosis not present

## 2020-01-22 DIAGNOSIS — F428 Other obsessive-compulsive disorder: Secondary | ICD-10-CM | POA: Diagnosis not present

## 2020-01-22 DIAGNOSIS — F411 Generalized anxiety disorder: Secondary | ICD-10-CM | POA: Diagnosis not present

## 2020-01-22 DIAGNOSIS — F320 Major depressive disorder, single episode, mild: Secondary | ICD-10-CM | POA: Diagnosis not present

## 2020-01-29 DIAGNOSIS — F428 Other obsessive-compulsive disorder: Secondary | ICD-10-CM | POA: Diagnosis not present

## 2020-01-29 DIAGNOSIS — F411 Generalized anxiety disorder: Secondary | ICD-10-CM | POA: Diagnosis not present

## 2020-01-29 DIAGNOSIS — F320 Major depressive disorder, single episode, mild: Secondary | ICD-10-CM | POA: Diagnosis not present

## 2020-02-03 ENCOUNTER — Non-Acute Institutional Stay (SKILLED_NURSING_FACILITY): Payer: Medicare Other | Admitting: Adult Health

## 2020-02-03 ENCOUNTER — Encounter: Payer: Self-pay | Admitting: Adult Health

## 2020-02-03 DIAGNOSIS — F418 Other specified anxiety disorders: Secondary | ICD-10-CM

## 2020-02-03 DIAGNOSIS — I5032 Chronic diastolic (congestive) heart failure: Secondary | ICD-10-CM | POA: Diagnosis not present

## 2020-02-03 DIAGNOSIS — K5909 Other constipation: Secondary | ICD-10-CM

## 2020-02-03 NOTE — Progress Notes (Signed)
Location:  [place] Room Number: 122-D Place of Service:  SNF (31)   CODE STATUS: DNR  Allergies  Allergen Reactions   Bee Venom Anaphylaxis   Ace Inhibitors    Angiotensin Receptor Blockers     Tongue swelling.   Aspirin     Directed by physician   Cabbage Other (See Comments)    Certain foods due to gout flares   Latex Rash    Chief Complaint  Patient presents with   Medical Management of Chronic Issues        Chronic constipation:    Depression with anxiety:   Chronic diastolic CHF (congestive heart failure)     HPI:  She is a 86 year old long term resident of this facility being seen for the management of her chronic illnesses: constipation; depression chf. There are no reports of uncontrolled pain; no anxiety or agitation; no depressive thoughts.   Past Medical History:  Diagnosis Date   Anxiety    Cellulitis 2016   CHF (congestive heart failure) (HCC)    Chronic kidney disease    Dementia (HCC)    Dysphagia    Gait abnormality    GERD (gastroesophageal reflux disease)    Glaucoma    Gout    Gout, unspecified 01/12/2010   Qualifier: Diagnosis of  By: Moshe Cipro MD, Margaret     Hyperlipidemia    Hypertension    Muscle weakness    Myocardial infarction ([place]) 1990s   [place]   Osteoarthritis    Syncope     Past Surgical History:  Procedure Laterality Date   APPENDECTOMY     CATARACT EXTRACTION W/PHACO  08/04/2011   Procedure: CATARACT EXTRACTION PHACO AND INTRAOCULAR LENS PLACEMENT ([place]);  Surgeon: Tonny Branch;  Location: [facility];  Service: Ophthalmology;  Laterality: Right;  CDE=18.53   CATARACT EXTRACTION W/PHACO  08/25/2011   Procedure: CATARACT EXTRACTION PHACO AND INTRAOCULAR LENS PLACEMENT ([facility]);  Surgeon: Tonny Branch;  Location: [facility];  Service: Ophthalmology;  Laterality: Left;  CDE:14.76   TONSILLECTOMY      Social History   Socioeconomic History   Marital status: Divorced    Spouse name:  Not on file   Number of children: Not on file   Years of education: Not on file   Highest education level: Not on file  Occupational History   Occupation: retired   Tobacco Use   Smoking status: Never Smoker   Smokeless tobacco: Never Used  Substance and Sexual Activity   Alcohol use: No   Drug use: No   Sexual activity: Not Currently  Other Topics Concern   Not on file  Social History Narrative   Long term resident [facility]    Social Determinants of Health   Financial Resource Strain:    Difficulty of Paying Living Expenses:   Food Insecurity:    Worried About Charity fundraiser in the Last Year:    Arboriculturist in the Last Year:   Transportation Needs:    Film/video editor (Medical):    Lack of Transportation (Non-Medical):   Physical Activity:    Days of Exercise per Week:    Minutes of Exercise per Session:   Stress:    Feeling of Stress :   Social Connections:    Frequency of Communication with Friends and Family:    Frequency of Social Gatherings with Friends and Family:    Attends Religious Services:    Active Member of Clubs or Organizations:  Attends Archivist Meetings:    Marital Status:   Intimate Partner Violence:    Fear of Current or Ex-Partner:    Emotionally Abused:    Physically Abused:    Sexually Abused:    Family History  Problem Relation Age of Onset   Diabetes Father    Diabetes Sister    Diabetes Brother    Stroke Sister    Stroke Sister    Anesthesia problems Neg Hx    Hypotension Neg Hx    Malignant hyperthermia Neg Hx    Pseudochol deficiency Neg Hx       VITAL SIGNS BP (!) 170/70    Pulse 66    Temp 99 F (37.2 C) (Oral)    Resp 20    Ht 4\' 11"  (1.499 m)    Wt 165 lb 12.8 oz (75.2 kg)    SpO2 97%    BMI 33.49 kg/m   Outpatient Encounter Medications as of 02/03/2020  Medication Sig   acetaminophen (TYLENOL) 500 MG tablet Take 1,000 mg by mouth every 8 (eight) hours as  needed. For leg pain and arthritis. May have additional 1000 mg for break through pain once a day prn   alum & mag hydroxide-simeth (MAALOX PLUS) 400-400-40 MG/5ML suspension Take 20 mLs by mouth 2 (two) times daily as needed for indigestion.    Alum & Mag Hydroxide-Simeth (MYLANTA PO) Take by mouth. liquid; regular; amt: 4 tsp.; oral Special Instructions: after breakfast Once A Day 09:00 AM   Balsam [place]-Castor Oil (VENELEX) OINT Apply topically to sacrum and bilateral buttocks every shift and as needed   Brinzolamide-Brimonidine (SIMBRINZA) 1-0.2 % SUSP One  drop into both eyes three times a day    Calcium Carbonate-Vitamin D (CALCIUM-VITAMIN D) 500-200 MG-UNIT tablet Take 1 tablet by mouth 2 (two) times daily.   citalopram (CELEXA) 10 MG tablet Take 30 mg by mouth daily.    cycloSPORINE (RESTASIS) 0.05 % ophthalmic emulsion Place 1 drop into both eyes 2 (two) times daily.    docusate sodium (COLACE) 100 MG capsule Take 100 mg by mouth daily.    donepezil (ARICEPT) 5 MG tablet Take 5 mg by mouth daily. For dementia   hydrALAZINE (APRESOLINE) 50 MG tablet Take 50 mg by mouth 2 (two) times a day.    hydrochlorothiazide (HYDRODIURIL) 12.5 MG tablet Take 12.5 mg by mouth daily.    ipratropium-albuterol (DUONEB) 0.5-2.5 (3) MG/3ML SOLN Take 3 mLs by nebulization every 6 (six) hours as needed.   latanoprost (XALATAN) 0.005 % ophthalmic solution Place 1 drop into both eyes at bedtime.   loratadine (CLARITIN) 10 MG tablet Take 10 mg by mouth daily.   LORazepam (ATIVAN) 0.5 MG tablet Take 0.5 tablets (0.25 mg total) by mouth daily.   nitroGLYCERIN (NITROSTAT) 0.4 MG SL tablet Place 0.4 mg under the tongue every 5 (five) minutes as needed for chest pain.    NON FORMULARY Diet Type:  Regular, NAS   OXYGEN Inhale 2 L/min into the lungs continuous. Wean to keep sats above 90%   pantoprazole (PROTONIX) 40 MG tablet Take 40 mg by mouth daily. Do not crush.    potassium chloride  (K-DUR,KLOR-CON) 10 MEQ tablet Take 10 mEq by mouth daily.   Saline (OCEAN NASAL SPRAY NA) Place 1 spray into the nose every 2 (two) hours as needed.   No facility-administered encounter medications on file as of 02/03/2020.     SIGNIFICANT DIAGNOSTIC EXAMS   PREVIOUS ;   07-22-19: right forearm  x-ray  1. Impacted fracture of the radial neck junction. 2. Suspect additional nondisplaced fracture of the coronoid process. 3. Right oval swelling and trace joint effusion. 4. Degenerative arthrosis of the wrist and elbow. Enthesopathic changes of the olecranon.  07-22-19: right shoulder x-ray  1. The osseous structures appear diffusely demineralized which may limit detection of small or nondisplaced fractures. 2. No acute fracture or traumatic malalignment. 3. High-riding appearance of the humeral head likely reflecting rotator cuff insufficiency. 4. Moderate osteoarthrosis of the acromioclavicular and glenohumeral joints.   NO NEW EXAMS.   LABS REVIEWED PREVIOUS:   03-11-19: wbc 4.7; hgb 11.4; hct 36.7; mcv 83.4; plt 83.4; plt 211; glucose 87; bun 44; creat 1.32; k+ 3.8; na++ 139; ca 9.4; liver normal albumin 4.0  09-06-19: wbc 4.4; hgb 10.8; hct 34.9; mcv 84.7 plt 261; glucose 81; bun 26; creat 1.21; k+ 3.5; na++ 141; ca 9.1 liver normal albumin 3.5     NO NEW LABS.   Review of Systems  Constitutional: Negative for malaise/fatigue.  Respiratory: Negative for cough and shortness of breath.   Cardiovascular: Negative for chest pain, palpitations and leg swelling.  Gastrointestinal: Negative for abdominal pain, constipation and heartburn.  Musculoskeletal: Negative for back pain, joint pain and myalgias.  Skin: Negative.   Neurological: Negative for dizziness.  Psychiatric/Behavioral: The patient is not nervous/anxious.       Physical Exam Constitutional:      General: She is not in acute distress.    Appearance: She is well-developed. She is obese. She is not diaphoretic.    Eyes:     Comments: History of bilateral cataracts with implants    Neck:     Thyroid: No thyromegaly.  Cardiovascular:     Rate and Rhythm: Normal rate and regular rhythm.     Pulses: Normal pulses.     Heart sounds: Normal heart sounds.     Comments:  Pedal pulses faint  Pulmonary:     Effort: Pulmonary effort is normal. No respiratory distress.     Breath sounds: Normal breath sounds.  Abdominal:     General: Bowel sounds are normal. There is no distension.     Palpations: Abdomen is soft.     Tenderness: There is no abdominal tenderness.  Musculoskeletal:        General: Normal range of motion.     Cervical back: Neck supple.     Right lower leg: No edema.     Left lower leg: No edema.  Lymphadenopathy:     Cervical: No cervical adenopathy.  Skin:    General: Skin is warm and dry.  Neurological:     Mental Status: She is alert and oriented to person, place, and time.  Psychiatric:        Mood and Affect: Mood normal.      ASSESSMENT/ PLAN:  TODAY:   1. Chronic constipation: is stable will continue colace twice daily   2. Depression with anxiety: is stable will continue celexa 10 mg daily ativan 0.25 mg daily   3. Chronic diastolic CHF (congestive heart failure) EF 65-70% (03-04-18) will continue apresoline 50 mg twice daily and has prn ntg    PREVIOUS  4. Deep vein thrombosis (DVT) of left lower extremity unspecified chronicity unspecified vein: is stable will continue to monitor her status.   5. Chronic non-seasonal allergic rhinitis: is stable will continue claritin 10 mg daily   6. 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 GERD without esophagitis: is stable will continue protonic 40 mg daily    8.chronic respiratory failure with hypoxia on home 02 therapy. Is stable will continue  claritin 10 mg daily   9. Increased intraocular pressure bilateral: is stable will continue simbrinza three times daily and travatan nightly to both eyes.   10. Hypokalemia is stable will continue k+ 10 meq daily   11. Vascular dementia with behavioral disturbance: no significant change in status: weight is 165 pounds albumin 3.9 will continue aricept 5 mg daily her weight is stable at this time.   12. Primary osteoarthritis multiple joints: is stable will continue tylenol 1 gm twice daily   13. CKD (chronic kidney disease) stage 3 GFR 30-59 ml/min is stable bun 27; creat 1.09        MD is aware of resident's narcotic use and is in agreement with current plan of care. We will attempt to wean resident as appropriate.  Ok Edwards NP [facility]  Contact [phone] Monday through Friday 8am- 5pm  After hours call [phone]

## 2020-02-04 ENCOUNTER — Other Ambulatory Visit: Payer: Self-pay | Admitting: Adult Health

## 2020-02-04 MED ORDER — LORAZEPAM 0.5 MG PO TABS: 0.2500 mg | ORAL_TABLET | Freq: Every day | ORAL | 0 refills | Status: DC

## 2020-02-05 DIAGNOSIS — F320 Major depressive disorder, single episode, mild: Secondary | ICD-10-CM | POA: Diagnosis not present

## 2020-02-05 DIAGNOSIS — F411 Generalized anxiety disorder: Secondary | ICD-10-CM | POA: Diagnosis not present

## 2020-02-05 DIAGNOSIS — F428 Other obsessive-compulsive disorder: Secondary | ICD-10-CM | POA: Diagnosis not present

## 2020-02-07 DIAGNOSIS — F411 Generalized anxiety disorder: Secondary | ICD-10-CM | POA: Diagnosis not present

## 2020-02-12 ENCOUNTER — Non-Acute Institutional Stay: Payer: Self-pay | Admitting: Adult Health

## 2020-02-12 ENCOUNTER — Encounter: Payer: Self-pay | Admitting: Adult Health

## 2020-02-12 DIAGNOSIS — F428 Other obsessive-compulsive disorder: Secondary | ICD-10-CM | POA: Diagnosis not present

## 2020-02-12 DIAGNOSIS — F411 Generalized anxiety disorder: Secondary | ICD-10-CM | POA: Diagnosis not present

## 2020-02-12 DIAGNOSIS — F418 Other specified anxiety disorders: Secondary | ICD-10-CM

## 2020-02-12 DIAGNOSIS — F320 Major depressive disorder, single episode, mild: Secondary | ICD-10-CM | POA: Diagnosis not present

## 2020-02-12 NOTE — Progress Notes (Signed)
Location:    [place] Room Number: 122/D Place of Service:  SNF (31)   CODE STATUS: DNR  Allergies  Allergen Reactions  . Bee Venom Anaphylaxis  . Ace Inhibitors   . Angiotensin Receptor Blockers     Tongue swelling.  . Aspirin     Directed by physician  . Cabbage Other (See Comments)    Certain foods due to gout flares  . Latex Rash    Chief Complaint  Patient presents with  . Acute Visit    Medication Review     HPI:  We have come together for her medication review. She is presently taking ativan 0.25 mg daily and celexa 30 mg daily. The celexa is above the recommended dose for geriatric patient. There are no reports of of agitation; no reports of anxiety or depressive thoughts.   Past Medical History:  Diagnosis Date  . Anxiety   . Cellulitis 2016  . CHF (congestive heart failure) ([place])   . Chronic kidney disease   . Dementia ([place])   . Dysphagia   . Gait abnormality   . GERD (gastroesophageal reflux disease)   . Glaucoma   . Gout   . Gout, unspecified 01/12/2010   Qualifier: Diagnosis of  By: Moshe Cipro MD, Joycelyn Schmid    . Hyperlipidemia   . Hypertension   . Muscle weakness   . Myocardial infarction [facility]) [place]  . Osteoarthritis   . Syncope     Past Surgical History:  Procedure Laterality Date  . APPENDECTOMY    . CATARACT EXTRACTION W/PHACO  08/04/2011   Procedure: CATARACT EXTRACTION PHACO AND INTRAOCULAR LENS PLACEMENT ([facility]);  Surgeon: Tonny Branch;  Location: [facility];  Service: Ophthalmology;  Laterality: Right;  CDE=18.53  . CATARACT EXTRACTION W/PHACO  08/25/2011   Procedure: CATARACT EXTRACTION PHACO AND INTRAOCULAR LENS PLACEMENT ([facility]);  Surgeon: Tonny Branch;  Location: [facility];  Service: Ophthalmology;  Laterality: Left;  CDE:14.76  . TONSILLECTOMY      Social History   Socioeconomic History  . Marital status: Divorced    Spouse name: Not on file  . Number of children: Not on file  . Years of education: Not on  file  . Highest education level: Not on file  Occupational History  . Occupation: retired   Tobacco Use  . Smoking status: Never Smoker  . Smokeless tobacco: Never Used  Vaping Use  . Vaping Use: Never used  Substance and Sexual Activity  . Alcohol use: No  . Drug use: No  . Sexual activity: Not Currently  Other Topics Concern  . Not on file  Social History Narrative   Long term resident [facility]    Social Determinants of Health   Financial Resource Strain:   . Difficulty of Paying Living Expenses:   Food Insecurity:   . Worried About Charity fundraiser in the Last Year:   . Arboriculturist in the Last Year:   Transportation Needs:   . Film/video editor (Medical):   Marland Kitchen Lack of Transportation (Non-Medical):   Physical Activity:   . Days of Exercise per Week:   . Minutes of Exercise per Session:   Stress:   . Feeling of Stress :   Social Connections:   . Frequency of Communication with Friends and Family:   . Frequency of Social Gatherings with Friends and Family:   . Attends Religious Services:   . Active Member of Clubs or Organizations:   .  Attends Archivist Meetings:   Marland Kitchen Marital Status:   Intimate Partner Violence:   . Fear of Current or Ex-Partner:   . Emotionally Abused:   Marland Kitchen Physically Abused:   . Sexually Abused:    Family History  Problem Relation Age of Onset  . Diabetes Father   . Diabetes Sister   . Diabetes Brother   . Stroke Sister   . Stroke Sister   . Anesthesia problems Neg Hx   . Hypotension Neg Hx   . Malignant hyperthermia Neg Hx   . Pseudochol deficiency Neg Hx       VITAL SIGNS BP 128/79   Pulse 81   Temp 98.1 F (36.7 C) (Oral)   Resp 20   Ht 4\' 11"  (1.499 m)   Wt 165 lb 12.8 oz (75.2 kg)   SpO2 92%   BMI 33.49 kg/m   Outpatient Encounter Medications as of 02/12/2020  Medication Sig  . acetaminophen (TYLENOL) 500 MG tablet Take 1,000 mg by mouth every 8 (eight) hours as needed. For leg pain and arthritis. May  have additional 1000 mg for break through pain once a day prn  . alum & mag hydroxide-simeth (MAALOX PLUS) 400-400-40 MG/5ML suspension Take 20 mLs by mouth 2 (two) times daily as needed for indigestion.   . Alum & Mag Hydroxide-Simeth (MYLANTA PO) Take by mouth. liquid; regular; amt: 4 tsp.; oral Special Instructions: after breakfast Once A Day 09:00 AM  . Balsam [place]-Castor Oil (VENELEX) OINT Apply topically to sacrum and bilateral buttocks every shift and as needed  . Brinzolamide-Brimonidine (SIMBRINZA) 1-0.2 % SUSP One  drop into both eyes three times a day   . Calcium Carbonate-Vitamin D (CALCIUM-VITAMIN D) 500-200 MG-UNIT tablet Take 1 tablet by mouth 2 (two) times daily.  . citalopram (CELEXA) 10 MG tablet Take 30 mg by mouth daily.   . cycloSPORINE (RESTASIS) 0.05 % ophthalmic emulsion Place 1 drop into both eyes 2 (two) times daily.   Marland Kitchen docusate sodium (COLACE) 100 MG capsule Take 100 mg by mouth daily.   Marland Kitchen donepezil (ARICEPT) 5 MG tablet Take 5 mg by mouth daily. For dementia  . hydrALAZINE (APRESOLINE) 50 MG tablet Take 50 mg by mouth 2 (two) times a day.   . hydrochlorothiazide (HYDRODIURIL) 12.5 MG tablet Take 12.5 mg by mouth daily.   Marland Kitchen ipratropium-albuterol (DUONEB) 0.5-2.5 (3) MG/3ML SOLN Take 3 mLs by nebulization every 6 (six) hours as needed.  . latanoprost (XALATAN) 0.005 % ophthalmic solution Place 1 drop into both eyes at bedtime.  Marland Kitchen loratadine (CLARITIN) 10 MG tablet Take 10 mg by mouth daily.  Marland Kitchen LORazepam (ATIVAN) 0.5 MG tablet Take 0.5 tablets (0.25 mg total) by mouth daily.  . nitroGLYCERIN (NITROSTAT) 0.4 MG SL tablet Place 0.4 mg under the tongue every 5 (five) minutes as needed for chest pain.   . NON FORMULARY Diet Type:  Regular, NAS  . OXYGEN Inhale 2 L/min into the lungs continuous. Wean to keep sats above 90%  . pantoprazole (PROTONIX) 40 MG tablet Take 40 mg by mouth daily. Do not crush.   . potassium chloride (K-DUR,KLOR-CON) 10 MEQ tablet Take 10 mEq by  mouth daily.  . Saline (OCEAN NASAL SPRAY NA) Place 1 spray into the nose every 2 (two) hours as needed.   No facility-administered encounter medications on file as of 02/12/2020.     SIGNIFICANT DIAGNOSTIC EXAMS   PREVIOUS ;   07-22-19: right forearm x-ray  1. Impacted fracture of the radial  neck junction. 2. Suspect additional nondisplaced fracture of the coronoid process. 3. Right oval swelling and trace joint effusion. 4. Degenerative arthrosis of the wrist and elbow. Enthesopathic changes of the olecranon.  07-22-19: right shoulder x-ray  1. The osseous structures appear diffusely demineralized which may limit detection of small or nondisplaced fractures. 2. No acute fracture or traumatic malalignment. 3. High-riding appearance of the humeral head likely reflecting rotator cuff insufficiency. 4. Moderate osteoarthrosis of the acromioclavicular and glenohumeral joints.   NO NEW EXAMS.   LABS REVIEWED PREVIOUS:   03-11-19: wbc 4.7; hgb 11.4; hct 36.7; mcv 83.4; plt 83.4; plt 211; glucose 87; bun 44; creat 1.32; k+ 3.8; na++ 139; ca 9.4; liver normal albumin 4.0  09-06-19: wbc 4.4; hgb 10.8; hct 34.9; mcv 84.7 plt 261; glucose 81; bun 26; creat 1.21; k+ 3.5; na++ 141; ca 9.1 liver normal albumin 3.5     NO NEW LABS.   Review of Systems  Constitutional: Negative for malaise/fatigue.  Respiratory: Negative for cough and shortness of breath.   Cardiovascular: Positive for leg swelling. Negative for chest pain and palpitations.  Gastrointestinal: Negative for abdominal pain, constipation and heartburn.  Musculoskeletal: Negative for back pain, joint pain and myalgias.  Skin: Negative.   Neurological: Negative for dizziness.  Psychiatric/Behavioral: The patient is not nervous/anxious.     Physical Exam Constitutional:      General: She is not in acute distress.    Appearance: She is obese. She is not diaphoretic.  Eyes:     Conjunctiva/sclera: Conjunctivae normal.      Comments: History of bilateral cataracts with implants     Neck:     Thyroid: No thyromegaly.     Vascular: No JVD.  Cardiovascular:     Rate and Rhythm: Normal rate and regular rhythm.     Comments: Pedal pulses faint  Pulmonary:     Effort: Pulmonary effort is normal. No respiratory distress.     Breath sounds: Normal breath sounds. No wheezing.  Abdominal:     General: Bowel sounds are normal. There is no distension.     Palpations: Abdomen is soft.     Tenderness: There is no abdominal tenderness.  Musculoskeletal:     Cervical back: Neck supple.     Right lower leg: Edema present.     Left lower leg: Edema present.     Comments: Able to move all extremities   Lymphadenopathy:     Cervical: No cervical adenopathy.  Skin:    General: Skin is warm and dry.  Neurological:     Mental Status: She is alert and oriented to person, place, and time.  Psychiatric:        Mood and Affect: Mood normal.        ASSESSMENT/ PLAN:  TODAY  1. Depression with anxiety:  Is stable will continue xanax 0.25 mg daily  Will lower her celexa to 20 mg daily  Will monitor her status.   MD is aware of resident's narcotic use and is in agreement with current plan of care. We will attempt to wean resident as appropriate.  Ok Edwards NP [facility]  Contact [phone] Monday through Friday 8am- 5pm  After hours call [phone]

## 2020-02-19 ENCOUNTER — Encounter: Payer: Self-pay | Admitting: Adult Health

## 2020-02-19 ENCOUNTER — Non-Acute Institutional Stay (SKILLED_NURSING_FACILITY): Payer: Medicare Other | Admitting: Adult Health

## 2020-02-19 DIAGNOSIS — M170 Bilateral primary osteoarthritis of knee: Secondary | ICD-10-CM | POA: Diagnosis not present

## 2020-02-19 DIAGNOSIS — F418 Other specified anxiety disorders: Secondary | ICD-10-CM

## 2020-02-19 DIAGNOSIS — I5032 Chronic diastolic (congestive) heart failure: Secondary | ICD-10-CM | POA: Diagnosis not present

## 2020-02-19 NOTE — Progress Notes (Signed)
Location:    [place] Room Number: 122/D Place of Service:  SNF (31)   CODE STATUS: DNR  Allergies  Allergen Reactions  . Bee Venom Anaphylaxis  . Ace Inhibitors   . Angiotensin Receptor Blockers     Tongue swelling.  . Aspirin     Directed by physician  . Cabbage Other (See Comments)    Certain foods due to gout flares  . Latex Rash    Chief Complaint  Patient presents with  . Acute Visit    Medication Review    HPI:  She has had her celexa lowered to a more appropriate dose of 20 mg daily; she is not tolerating this dose reduction. She is more agitated and has had a fall without injury. Her chronic edema is worse; she does not elevate her legs. She does have right knee pain.   Past Medical History:  Diagnosis Date  . Anxiety   . Cellulitis 2016  . CHF (congestive heart failure) ([place])   . Chronic kidney disease   . Dementia ([place])   . Dysphagia   . Gait abnormality   . GERD (gastroesophageal reflux disease)   . Glaucoma   . Gout   . Gout, unspecified 01/12/2010   Qualifier: Diagnosis of  By: Moshe Cipro MD, Joycelyn Schmid    . Hyperlipidemia   . Hypertension   . Muscle weakness   . Myocardial infarction [facility] [place]  . Osteoarthritis   . Syncope     Past Surgical History:  Procedure Laterality Date  . APPENDECTOMY    . CATARACT EXTRACTION W/PHACO  08/04/2011   Procedure: CATARACT EXTRACTION PHACO AND INTRAOCULAR LENS PLACEMENT (IOC);  Surgeon: Tonny Branch;  Location: [facility] ORS;  Service: Ophthalmology;  Laterality: Right;  CDE=18.53  . CATARACT EXTRACTION W/PHACO  08/25/2011   Procedure: CATARACT EXTRACTION PHACO AND INTRAOCULAR LENS PLACEMENT (IOC);  Surgeon: Tonny Branch;  Location: [facility] ORS;  Service: Ophthalmology;  Laterality: Left;  CDE:14.76  . TONSILLECTOMY      Social History   Socioeconomic History  . Marital status: Divorced    Spouse name: Not on file  . Number of children: Not on file  . Years of education: Not on file    . Highest education level: Not on file  Occupational History  . Occupation: retired   Tobacco Use  . Smoking status: Never Smoker  . Smokeless tobacco: Never Used  Vaping Use  . Vaping Use: Never used  Substance and Sexual Activity  . Alcohol use: No  . Drug use: No  . Sexual activity: Not Currently  Other Topics Concern  . Not on file  Social History Narrative   Long term resident [facility]    Social Determinants of Health   Financial Resource Strain:   . Difficulty of Paying Living Expenses:   Food Insecurity:   . Worried About Charity fundraiser in the Last Year:   . Arboriculturist in the Last Year:   Transportation Needs:   . Film/video editor (Medical):   Marland Kitchen Lack of Transportation (Non-Medical):   Physical Activity:   . Days of Exercise per Week:   . Minutes of Exercise per Session:   Stress:   . Feeling of Stress :   Social Connections:   . Frequency of Communication with Friends and Family:   . Frequency of Social Gatherings with Friends and Family:   . Attends Religious Services:   . Active Member of Clubs  or Organizations:   . Attends Archivist Meetings:   Marland Kitchen Marital Status:   Intimate Partner Violence:   . Fear of Current or Ex-Partner:   . Emotionally Abused:   Marland Kitchen Physically Abused:   . Sexually Abused:    Family History  Problem Relation Age of Onset  . Diabetes Father   . Diabetes Sister   . Diabetes Brother   . Stroke Sister   . Stroke Sister   . Anesthesia problems Neg Hx   . Hypotension Neg Hx   . Malignant hyperthermia Neg Hx   . Pseudochol deficiency Neg Hx       VITAL SIGNS BP (!) 158/70   Pulse 68   Temp 98.4 F (36.9 C) (Oral)   Resp 18   Ht 4\' 11"  (1.499 m)   Wt 165 lb 12.8 oz (75.2 kg)   SpO2 90%   BMI 33.49 kg/m   Outpatient Encounter Medications as of 02/19/2020  Medication Sig  . acetaminophen (TYLENOL) 500 MG tablet Take 1,000 mg by mouth every 8 (eight) hours as needed. For leg pain and arthritis. May have  additional 1000 mg for break through pain once a day prn  . alum & mag hydroxide-simeth (MAALOX PLUS) 400-400-40 MG/5ML suspension Take 20 mLs by mouth 2 (two) times daily as needed for indigestion.   . Alum & Mag Hydroxide-Simeth (MYLANTA PO) Take by mouth. liquid; regular; amt: 4 tsp.; oral Special Instructions: after breakfast Once A Day 09:00 AM  . Balsam [place]-Castor Oil (VENELEX) OINT Apply topically to sacrum and bilateral buttocks every shift and as needed  . Brinzolamide-Brimonidine (SIMBRINZA) 1-0.2 % SUSP One  drop into both eyes three times a day   . Calcium Carbonate-Vitamin D (CALCIUM-VITAMIN D) 500-200 MG-UNIT tablet Take 1 tablet by mouth 2 (two) times daily.  . citalopram (CELEXA) 10 MG tablet Take 30 mg by mouth daily.   . cycloSPORINE (RESTASIS) 0.05 % ophthalmic emulsion Place 1 drop into both eyes 2 (two) times daily.   Marland Kitchen docusate sodium (COLACE) 100 MG capsule Take 100 mg by mouth daily.   Marland Kitchen donepezil (ARICEPT) 5 MG tablet Take 5 mg by mouth daily. For dementia  . hydrALAZINE (APRESOLINE) 50 MG tablet Take 50 mg by mouth 2 (two) times a day.   . hydrochlorothiazide (HYDRODIURIL) 12.5 MG tablet Take 12.5 mg by mouth daily.   Marland Kitchen ipratropium-albuterol (DUONEB) 0.5-2.5 (3) MG/3ML SOLN Take 3 mLs by nebulization every 6 (six) hours as needed.  . latanoprost (XALATAN) 0.005 % ophthalmic solution Place 1 drop into both eyes at bedtime.  Marland Kitchen loratadine (CLARITIN) 10 MG tablet Take 10 mg by mouth daily.  Marland Kitchen LORazepam (ATIVAN) 0.5 MG tablet Take 0.5 tablets (0.25 mg total) by mouth daily.  . nitroGLYCERIN (NITROSTAT) 0.4 MG SL tablet Place 0.4 mg under the tongue every 5 (five) minutes as needed for chest pain.   . NON FORMULARY Diet Type:  Regular, NAS  . OXYGEN Inhale 2 L/min into the lungs continuous. Wean to keep sats above 90%  . pantoprazole (PROTONIX) 40 MG tablet Take 40 mg by mouth daily. Do not crush.   . potassium chloride (K-DUR,KLOR-CON) 10 MEQ tablet Take 10 mEq by mouth  daily.  . Saline (OCEAN NASAL SPRAY NA) Place 1 spray into the nose every 2 (two) hours as needed.   No facility-administered encounter medications on file as of 02/19/2020.     SIGNIFICANT DIAGNOSTIC EXAMS   PREVIOUS ;   07-22-19: right forearm x-ray  1. Impacted fracture of the radial neck junction. 2. Suspect additional nondisplaced fracture of the coronoid process. 3. Right oval swelling and trace joint effusion. 4. Degenerative arthrosis of the wrist and elbow. Enthesopathic changes of the olecranon.  07-22-19: right shoulder x-ray  1. The osseous structures appear diffusely demineralized which may limit detection of small or nondisplaced fractures. 2. No acute fracture or traumatic malalignment. 3. High-riding appearance of the humeral head likely reflecting rotator cuff insufficiency. 4. Moderate osteoarthrosis of the acromioclavicular and glenohumeral joints.   NO NEW EXAMS.   LABS REVIEWED PREVIOUS:   03-11-19: wbc 4.7; hgb 11.4; hct 36.7; mcv 83.4; plt 83.4; plt 211; glucose 87; bun 44; creat 1.32; k+ 3.8; na++ 139; ca 9.4; liver normal albumin 4.0  09-06-19: wbc 4.4; hgb 10.8; hct 34.9; mcv 84.7 plt 261; glucose 81; bun 26; creat 1.21; k+ 3.5; na++ 141; ca 9.1 liver normal albumin 3.5     NO NEW LABS.   Review of Systems  Constitutional: Negative for malaise/fatigue.  Respiratory: Negative for cough and shortness of breath.   Cardiovascular: Positive for leg swelling. Negative for chest pain and palpitations.  Gastrointestinal: Negative for abdominal pain, constipation and heartburn.  Musculoskeletal: Positive for joint pain. Negative for back pain and myalgias.       Right knee pain   Skin: Negative.   Neurological: Negative for dizziness.  Psychiatric/Behavioral: The patient is not nervous/anxious.    .   Physical Exam Constitutional:      General: She is not in acute distress.    Appearance: She is well-developed. She is obese. She is not diaphoretic.    Eyes:     Comments: History of bilateral cataracts with implants      Neck:     Thyroid: No thyromegaly.  Cardiovascular:     Rate and Rhythm: Normal rate and regular rhythm.     Heart sounds: Normal heart sounds.     Comments: Pedal pulses faint  Pulmonary:     Effort: Pulmonary effort is normal. No respiratory distress.     Breath sounds: Normal breath sounds.  Abdominal:     General: Bowel sounds are normal. There is no distension.     Palpations: Abdomen is soft.     Tenderness: There is no abdominal tenderness.  Musculoskeletal:     Cervical back: Neck supple.     Right lower leg: Edema present.     Left lower leg: Edema present.     Comments: Is able to move all extremities Has significant lower extremity edema  Lymphadenopathy:     Cervical: No cervical adenopathy.  Skin:    General: Skin is warm and dry.     Comments: Bilateral lower extremities discolored   Neurological:     Mental Status: She is alert and oriented to person, place, and time.  Psychiatric:        Mood and Affect: Mood normal.      ASSESSMENT/ PLAN:  TODAY  1. Chronic diastolic congestive heart failure 2. Depression with anxiety  3. Primary osteoarthritis of bilateral knees  Will get x-ray due to her concerns about pain Will increase celexa to 30 mg daily  Will stop hctz Will begin lasix 20 mg daily  On 02-24-20: will check cbc; cmp     MD is aware of resident's narcotic use and is in agreement with current plan of care. We will attempt to wean resident as appropriate.  Ok Edwards NP [facility]  Contact [phone] Monday  through Friday 8am- 5pm  After hours call [phone]

## 2020-02-24 ENCOUNTER — Encounter (HOSPITAL_COMMUNITY)
Admission: RE | Admit: 2020-02-24 | Discharge: 2020-02-24 | Disposition: A | Discharge status: Home / Self Care | Payer: Medicare Other | Source: Skilled Nursing Facility | Attending: Adult Health | Admitting: Adult Health

## 2020-02-24 DIAGNOSIS — I130 Hypertensive heart and chronic kidney disease with heart failure and stage 1 through stage 4 chronic kidney disease, or unspecified chronic kidney disease: Secondary | ICD-10-CM | POA: Insufficient documentation

## 2020-02-24 LAB — COMPREHENSIVE METABOLIC PANEL
ALT: 19 U/L (ref 0–44)
AST: 24 U/L (ref 15–41)
Albumin: 3.4 g/dL — ABNORMAL LOW (ref 3.5–5.0)
Alkaline Phosphatase: 53 U/L (ref 38–126)
Anion gap: 11 (ref 5–15)
BUN: 49 mg/dL — ABNORMAL HIGH (ref 8–23)
CO2: 30 mmol/L (ref 22–32)
Calcium: 8.7 mg/dL — ABNORMAL LOW (ref 8.9–10.3)
Chloride: 99 mmol/L (ref 98–111)
Creatinine, Ser: 1.54 mg/dL — ABNORMAL HIGH (ref 0.44–1.00)
GFR calc Af Amer: 33 mL/min — ABNORMAL LOW (ref >60)
GFR calc non Af Amer: 28 mL/min — ABNORMAL LOW (ref >60)
Glucose, Bld: 82 mg/dL (ref 70–99)
Potassium: 3.4 mmol/L — ABNORMAL LOW (ref 3.5–5.1)
Sodium: 140 mmol/L (ref 135–145)
Total Bilirubin: 0.6 mg/dL (ref 0.3–1.2)
Total Protein: 6 g/dL — ABNORMAL LOW (ref 6.5–8.1)

## 2020-02-24 LAB — CBC
HCT: 30 % — ABNORMAL LOW (ref 36.0–46.0)
Hemoglobin: 9.5 g/dL — ABNORMAL LOW (ref 12.0–15.0)
MCH: 26 pg (ref 26.0–34.0)
MCHC: 31.7 g/dL (ref 30.0–36.0)
MCV: 82.2 fL (ref 80.0–100.0)
Platelets: 160 10*3/uL (ref 150–400)
RBC: 3.65 MIL/uL — ABNORMAL LOW (ref 3.87–5.11)
RDW: 17.4 % — ABNORMAL HIGH (ref 11.5–15.5)
WBC: 4.6 10*3/uL (ref 4.0–10.5)
nRBC: 0 % (ref 0.0–0.2)

## 2020-02-26 NOTE — Telephone Encounter (Signed)
This encounter was created in error - please disregard.

## 2020-02-27 ENCOUNTER — Other Ambulatory Visit (HOSPITAL_COMMUNITY)
Admission: RE | Admit: 2020-02-27 | Discharge: 2020-02-27 | Disposition: A | Discharge status: Home / Self Care | Payer: Medicare Other | Source: Skilled Nursing Facility | Attending: Adult Health | Admitting: Adult Health

## 2020-02-27 DIAGNOSIS — I130 Hypertensive heart and chronic kidney disease with heart failure and stage 1 through stage 4 chronic kidney disease, or unspecified chronic kidney disease: Secondary | ICD-10-CM | POA: Diagnosis present

## 2020-02-27 LAB — BASIC METABOLIC PANEL
Anion gap: 10 (ref 5–15)
BUN: 41 mg/dL — ABNORMAL HIGH (ref 8–23)
CO2: 31 mmol/L (ref 22–32)
Calcium: 9 mg/dL (ref 8.9–10.3)
Chloride: 101 mmol/L (ref 98–111)
Creatinine, Ser: 1.29 mg/dL — ABNORMAL HIGH (ref 0.44–1.00)
GFR calc Af Amer: 40 mL/min — ABNORMAL LOW (ref >60)
GFR calc non Af Amer: 35 mL/min — ABNORMAL LOW (ref >60)
Glucose, Bld: 84 mg/dL (ref 70–99)
Potassium: 3.9 mmol/L (ref 3.5–5.1)
Sodium: 142 mmol/L (ref 135–145)

## 2020-03-04 ENCOUNTER — Non-Acute Institutional Stay (SKILLED_NURSING_FACILITY): Payer: Medicare Other | Admitting: Adult Health

## 2020-03-04 ENCOUNTER — Encounter: Payer: Self-pay | Admitting: Adult Health

## 2020-03-04 DIAGNOSIS — J3089 Other allergic rhinitis: Secondary | ICD-10-CM

## 2020-03-04 DIAGNOSIS — I5032 Chronic diastolic (congestive) heart failure: Secondary | ICD-10-CM | POA: Diagnosis not present

## 2020-03-04 DIAGNOSIS — I82402 Acute embolism and thrombosis of unspecified deep veins of left lower extremity: Secondary | ICD-10-CM | POA: Diagnosis not present

## 2020-03-04 DIAGNOSIS — I130 Hypertensive heart and chronic kidney disease with heart failure and stage 1 through stage 4 chronic kidney disease, or unspecified chronic kidney disease: Secondary | ICD-10-CM

## 2020-03-04 DIAGNOSIS — N1832 Chronic kidney disease, stage 3b: Secondary | ICD-10-CM

## 2020-03-04 DIAGNOSIS — F411 Generalized anxiety disorder: Secondary | ICD-10-CM | POA: Diagnosis not present

## 2020-03-04 NOTE — Progress Notes (Signed)
Location:    [place] Room Number: 122/D Place of Service:  SNF (31)   CODE STATUS: DNR  Allergies  Allergen Reactions  . Bee Venom Anaphylaxis  . Ace Inhibitors   . Angiotensin Receptor Blockers     Tongue swelling.  . Aspirin     Directed by physician  . Cabbage Other (See Comments)    Certain foods due to gout flares  . Latex Rash    Chief Complaint  Patient presents with  . Medical Management of Chronic Issues          Deep vein thrombosis (DVT) of left lower extremity unspecified chronicity unspecified vein:   Chronic non-seasonal allergic rhinitis:  Hypertensive heart and kidney disease with chronic diastolic congestive heart failure and stage 3 chronic kidney disease    HPI:  She is a 86 year old long term resident of this facility being seen for the management of her chronic illnesses: dvt; allergic rhinitis; hypertensive heart disease. There are no reports of uncontrolled; no reports of agitation or anxiety. She has failed her wean off celexa. She continues to have bilateral lower extremity edema. She does not elevate her legs; and will spend most nights sitting her wheelchair.    Past Medical History:  Diagnosis Date  . Anxiety   . Cellulitis 2016  . CHF (congestive heart failure) ([place])   . Chronic kidney disease   . Dementia ([place])   . Dysphagia   . Gait abnormality   . GERD (gastroesophageal reflux disease)   . Glaucoma   . Gout   . Gout, unspecified 01/12/2010   Qualifier: Diagnosis of  By: Moshe Cipro MD, Joycelyn Schmid    . Hyperlipidemia   . Hypertension   . Muscle weakness   . Myocardial infarction [facility] [place]  . Osteoarthritis   . Syncope     Past Surgical History:  Procedure Laterality Date  . APPENDECTOMY    . CATARACT EXTRACTION W/PHACO  08/04/2011   Procedure: CATARACT EXTRACTION PHACO AND INTRAOCULAR LENS PLACEMENT (IOC);  Surgeon: Tonny Branch;  Location: [facility];  Service: Ophthalmology;  Laterality: Right;   CDE=18.53  . CATARACT EXTRACTION W/PHACO  08/25/2011   Procedure: CATARACT EXTRACTION PHACO AND INTRAOCULAR LENS PLACEMENT (IOC);  Surgeon: Tonny Branch;  Location: [facility];  Service: Ophthalmology;  Laterality: Left;  CDE:14.76  . TONSILLECTOMY      Social History   Socioeconomic History  . Marital status: Divorced    Spouse name: Not on file  . Number of children: Not on file  . Years of education: Not on file  . Highest education level: Not on file  Occupational History  . Occupation: retired   Tobacco Use  . Smoking status: Never Smoker  . Smokeless tobacco: Never Used  Vaping Use  . Vaping Use: Never used  Substance and Sexual Activity  . Alcohol use: No  . Drug use: No  . Sexual activity: Not Currently  Other Topics Concern  . Not on file  Social History Narrative   Long term resident [facility]    Social Determinants of Health   Financial Resource Strain:   . Difficulty of Paying Living Expenses:   Food Insecurity:   . Worried About Charity fundraiser in the Last Year:   . Arboriculturist in the Last Year:   Transportation Needs:   . Film/video editor (Medical):   Marland Kitchen Lack of Transportation (Non-Medical):   Physical Activity:   .  Days of Exercise per Week:   . Minutes of Exercise per Session:   Stress:   . Feeling of Stress :   Social Connections:   . Frequency of Communication with Friends and Family:   . Frequency of Social Gatherings with Friends and Family:   . Attends Religious Services:   . Active Member of Clubs or Organizations:   . Attends Archivist Meetings:   Marland Kitchen Marital Status:   Intimate Partner Violence:   . Fear of Current or Ex-Partner:   . Emotionally Abused:   Marland Kitchen Physically Abused:   . Sexually Abused:    Family History  Problem Relation Age of Onset  . Diabetes Father   . Diabetes Sister   . Diabetes Brother   . Stroke Sister   . Stroke Sister   . Anesthesia problems Neg Hx   . Hypotension Neg Hx   . Malignant hyperthermia  Neg Hx   . Pseudochol deficiency Neg Hx       VITAL SIGNS BP (!) 152/68   Pulse 61   Temp 98.4 F (36.9 C) (Oral)   Resp 17   Ht 4\' 11"  (1.499 m)   Wt 163 lb 6.4 oz (74.1 kg)   SpO2 93%   BMI 33.00 kg/m   Outpatient Encounter Medications as of 03/04/2020  Medication Sig  . acetaminophen (TYLENOL) 500 MG tablet Take 1,000 mg by mouth every 8 (eight) hours as needed. For leg pain and arthritis. May have additional 1000 mg for break through pain once a day prn  . alum & mag hydroxide-simeth (MAALOX PLUS) 400-400-40 MG/5ML suspension Take 20 mLs by mouth 2 (two) times daily as needed for indigestion.   . Alum & Mag Hydroxide-Simeth (MYLANTA PO) Take by mouth. liquid; regular; amt: 4 tsp.; oral Special Instructions: after breakfast Once A Day 09:00 AM  . Balsam [place]-Castor Oil (VENELEX) OINT Apply topically to sacrum and bilateral buttocks every shift and as needed  . Brinzolamide-Brimonidine (SIMBRINZA) 1-0.2 % SUSP One  drop into both eyes three times a day   . Calcium Carbonate-Vitamin D (CALCIUM-VITAMIN D) 500-200 MG-UNIT tablet Take 1 tablet by mouth 2 (two) times daily.  . citalopram (CELEXA) 10 MG tablet Take 30 mg by mouth daily.   . cycloSPORINE (RESTASIS) 0.05 % ophthalmic emulsion Place 1 drop into both eyes 2 (two) times daily.   Marland Kitchen docusate sodium (COLACE) 100 MG capsule Take 100 mg by mouth daily.   Marland Kitchen donepezil (ARICEPT) 5 MG tablet Take 5 mg by mouth daily. For dementia  . furosemide (LASIX) 20 MG tablet Take 20 mg by mouth daily.  . hydrALAZINE (APRESOLINE) 50 MG tablet Take 50 mg by mouth 2 (two) times a day.   . ipratropium-albuterol (DUONEB) 0.5-2.5 (3) MG/3ML SOLN Take 3 mLs by nebulization every 6 (six) hours as needed.  . latanoprost (XALATAN) 0.005 % ophthalmic solution Place 1 drop into both eyes at bedtime.  Marland Kitchen loratadine (CLARITIN) 10 MG tablet Take 10 mg by mouth daily.  Marland Kitchen LORazepam (ATIVAN) 0.5 MG tablet Take 0.5 tablets (0.25 mg total) by mouth daily.  .  nitroGLYCERIN (NITROSTAT) 0.4 MG SL tablet Place 0.4 mg under the tongue every 5 (five) minutes as needed for chest pain.   . NON FORMULARY Diet Type:  Regular, NAS  . OXYGEN Inhale 2 L/min into the lungs continuous. Wean to keep sats above 90%  . pantoprazole (PROTONIX) 40 MG tablet Take 40 mg by mouth daily. Do not crush.   Marland Kitchen  potassium chloride (K-DUR,KLOR-CON) 10 MEQ tablet Take 10 mEq by mouth daily.  . Saline (OCEAN NASAL SPRAY NA) Place 1 spray into the nose every 2 (two) hours as needed.  . [DISCONTINUED] hydrochlorothiazide (HYDRODIURIL) 12.5 MG tablet Take 12.5 mg by mouth daily.    No facility-administered encounter medications on file as of 03/04/2020.     SIGNIFICANT DIAGNOSTIC EXAMS   PREVIOUS ;   07-22-19: right forearm x-ray  1. Impacted fracture of the radial neck junction. 2. Suspect additional nondisplaced fracture of the coronoid process. 3. Right oval swelling and trace joint effusion. 4. Degenerative arthrosis of the wrist and elbow. Enthesopathic changes of the olecranon.  07-22-19: right shoulder x-ray  1. The osseous structures appear diffusely demineralized which may limit detection of small or nondisplaced fractures. 2. No acute fracture or traumatic malalignment. 3. High-riding appearance of the humeral head likely reflecting rotator cuff insufficiency. 4. Moderate osteoarthrosis of the acromioclavicular and glenohumeral joints.   NO NEW EXAMS.   LABS REVIEWED PREVIOUS:   03-11-19: wbc 4.7; hgb 11.4; hct 36.7; mcv 83.4; plt 83.4; plt 211; glucose 87; bun 44; creat 1.32; k+ 3.8; na++ 139; ca 9.4; liver normal albumin 4.0  09-06-19: wbc 4.4; hgb 10.8; hct 34.9; mcv 84.7 plt 261; glucose 81; bun 26; creat 1.21; k+ 3.5; na++ 141; ca 9.1 liver normal albumin 3.5     TODAY  02-24-20: wbc 4.9; hgb 9.5; hct 30.0 mcv 82.2 plt 160; glucose 82; bun 49; creat 1.54; k+ 3.4; na++ 140; ca 8.7 liver normal albumin 3.4 02-27-20: glucose 84; bun 41; creat 1.29; k+ 3.9; na++  142; ca 9.0    Review of Systems  Constitutional: Negative for malaise/fatigue.  Respiratory: Negative for cough and shortness of breath.   Cardiovascular: Positive for leg swelling. Negative for chest pain and palpitations.  Gastrointestinal: Negative for abdominal pain, constipation and heartburn.  Musculoskeletal: Negative for back pain, joint pain and myalgias.  Skin: Negative.   Neurological: Negative for dizziness.  Psychiatric/Behavioral: The patient is not nervous/anxious.      Physical Exam Constitutional:      General: She is not in acute distress.    Appearance: She is well-developed. She is obese. She is not diaphoretic.  Eyes:     Comments: History of bilateral cataracts with implants       Neck:     Thyroid: No thyromegaly.  Cardiovascular:     Rate and Rhythm: Normal rate and regular rhythm.     Heart sounds: Normal heart sounds.     Comments: Pedal pulses faint  Pulmonary:     Effort: Pulmonary effort is normal. No respiratory distress.     Breath sounds: Normal breath sounds.  Abdominal:     General: Bowel sounds are normal. There is no distension.     Palpations: Abdomen is soft.     Tenderness: There is no abdominal tenderness.  Musculoskeletal:     Cervical back: Neck supple.     Right lower leg: Edema present.     Left lower leg: Edema present.     Comments: Is able to move all extremities Has significant lower extremity edema   Lymphadenopathy:     Cervical: No cervical adenopathy.  Skin:    General: Skin is warm and dry.     Comments: Bilateral lower extremities discolored    Neurological:     Mental Status: She is alert and oriented to person, place, and time.  Psychiatric:        Mood  and Affect: Mood normal.         ASSESSMENT/ PLAN:  TODAY:   1. Deep vein thrombosis (DVT) of left lower extremity unspecified chronicity unspecified vein: is stable will continue to monitor her status.   2. Chronic non-seasonal allergic rhinitis: is  stable will continue claritin 10 mg daily   3. Hypertensive heart and kidney disease with chronic diastolic congestive heart failure and stage 3 chronic kidney disease: is stable b/p 152/68 will continue apresoline 50 mg twice daily and lasix 20 mg daily    PREVIOUS  4. GERD without esophagitis: is stable will continue protonix 40 mg daily    5.chronic respiratory failure with hypoxia on home 02 therapy. Is stable will continue claritin 10 mg daily   6. Increased intraocular pressure bilateral: is stable will continue simbrinza three times daily and travatan nightly to both eyes.   7. Hypokalemia is stable will continue k+ 10 meq daily   8. Vascular dementia with behavioral disturbance: no significant change in status: weight is 163 pounds albumin 3.9 will continue aricept 5 mg daily her weight is stable at this time.   9. Primary osteoarthritis multiple joints: is stable will continue tylenol 1 gm twice daily   10. CKD (chronic kidney disease) stage 3 GFR 30-59 ml/min is stable bun 27; creat 1.09   11. Chronic constipation: is stable will continue colace twice daily   12. Depression with anxiety: is stable will continue celexa 30 mg daily ativan 0.25 mg daily has failed one wean from celexa   13. Chronic diastolic CHF (congestive heart failure) EF 65-70% (03-04-18) is without change  will continue apresoline 50 mg twice daily lasix 20 mg daily and has prn ntg        MD is aware of resident's narcotic use and is in agreement with current plan of care. We will attempt to wean resident as appropriate.  Ok Edwards NP [facility]  Contact [phone] Monday through Friday 8am- 5pm  After hours call [phone]

## 2020-03-05 ENCOUNTER — Encounter: Payer: Self-pay | Admitting: Adult Health

## 2020-03-05 ENCOUNTER — Non-Acute Institutional Stay (SKILLED_NURSING_FACILITY): Payer: Medicare Other | Admitting: Adult Health

## 2020-03-05 DIAGNOSIS — F02818 Dementia in other diseases classified elsewhere, unspecified severity, with other behavioral disturbance: Secondary | ICD-10-CM

## 2020-03-05 DIAGNOSIS — F0281 Dementia in other diseases classified elsewhere with behavioral disturbance: Secondary | ICD-10-CM

## 2020-03-05 DIAGNOSIS — I5032 Chronic diastolic (congestive) heart failure: Secondary | ICD-10-CM

## 2020-03-05 DIAGNOSIS — N1832 Chronic kidney disease, stage 3b: Secondary | ICD-10-CM

## 2020-03-05 DIAGNOSIS — G301 Alzheimer's disease with late onset: Secondary | ICD-10-CM | POA: Diagnosis not present

## 2020-03-05 NOTE — Progress Notes (Signed)
Location:    [facility] Room Number: 122/D Place of Service:  SNF (31)   CODE STATUS: DNR  Allergies  Allergen Reactions  . Bee Venom Anaphylaxis  . Ace Inhibitors   . Angiotensin Receptor Blockers     Tongue swelling.  . Aspirin     Directed by physician  . Cabbage Other (See Comments)    Certain foods due to gout flares  . Latex Rash    Chief Complaint  Patient presents with  . Acute Visit    Care Plan Meeting    HPI:  We have come together for her care plan meeting. BIMS 13/15 mood 3/30. She has has one fall without injury. Her weight is stable. She continues to require assistance with her adls. She is complaining of bilatereal breast swelling with the left greater than left. She continues to have lower extremity edema. She is willing to take a screening mammogram. She is willing to have her legs wrapped with ace wraps to help with edema. She continues to need encouragement to sleep in bed. She continue to followed for her chronic illnesses including:  Chronic diastolic CHF (congestive heart failure)  Late onset alzheimer's disease with behavioral disturbance 3b chronic kidney disease  Past Medical History:  Diagnosis Date  . Anxiety   . Cellulitis 2016  . CHF (congestive heart failure) ([place])   . Chronic kidney disease   . Dementia ([place])   . Dysphagia   . Gait abnormality   . GERD (gastroesophageal reflux disease)   . Glaucoma   . Gout   . Gout, unspecified 01/12/2010   Qualifier: Diagnosis of  By: Moshe Cipro MD, Joycelyn Schmid    . Hyperlipidemia   . Hypertension   . Muscle weakness   . Myocardial infarction [facility]) [place]  . Osteoarthritis   . Syncope     Past Surgical History:  Procedure Laterality Date  . APPENDECTOMY    . CATARACT EXTRACTION W/PHACO  08/04/2011   Procedure: CATARACT EXTRACTION PHACO AND INTRAOCULAR LENS PLACEMENT (IOC);  Surgeon: Tonny Branch;  Location: [facility] ORS;  Service: Ophthalmology;  Laterality: Right;  CDE=18.53   . CATARACT EXTRACTION W/PHACO  08/25/2011   Procedure: CATARACT EXTRACTION PHACO AND INTRAOCULAR LENS PLACEMENT (IOC);  Surgeon: Tonny Branch;  Location: [facility] ORS;  Service: Ophthalmology;  Laterality: Left;  CDE:14.76  . TONSILLECTOMY      Social History   Socioeconomic History  . Marital status: Divorced    Spouse name: Not on file  . Number of children: Not on file  . Years of education: Not on file  . Highest education level: Not on file  Occupational History  . Occupation: retired   Tobacco Use  . Smoking status: Never Smoker  . Smokeless tobacco: Never Used  Vaping Use  . Vaping Use: Never used  Substance and Sexual Activity  . Alcohol use: No  . Drug use: No  . Sexual activity: Not Currently  Other Topics Concern  . Not on file  Social History Narrative   Long term resident [facility]    Social Determinants of Health   Financial Resource Strain:   . Difficulty of Paying Living Expenses:   Food Insecurity:   . Worried About Charity fundraiser in the Last Year:   . Arboriculturist in the Last Year:   Transportation Needs:   . Film/video editor (Medical):   Marland Kitchen Lack of Transportation (Non-Medical):   Physical Activity:   .  Days of Exercise per Week:   . Minutes of Exercise per Session:   Stress:   . Feeling of Stress :   Social Connections:   . Frequency of Communication with Friends and Family:   . Frequency of Social Gatherings with Friends and Family:   . Attends Religious Services:   . Active Member of Clubs or Organizations:   . Attends Archivist Meetings:   Marland Kitchen Marital Status:   Intimate Partner Violence:   . Fear of Current or Ex-Partner:   . Emotionally Abused:   Marland Kitchen Physically Abused:   . Sexually Abused:    Family History  Problem Relation Age of Onset  . Diabetes Father   . Diabetes Sister   . Diabetes Brother   . Stroke Sister   . Stroke Sister   . Anesthesia problems Neg Hx   . Hypotension Neg Hx   . Malignant hyperthermia Neg Hx     . Pseudochol deficiency Neg Hx       VITAL SIGNS BP (!) 152/68   Pulse 61   Temp 98.8 F (37.1 C) (Oral)   Resp 17   Ht 4\' 11"  (1.499 m)   Wt 163 lb 6.4 oz (74.1 kg)   SpO2 92%   BMI 33.00 kg/m   Outpatient Encounter Medications as of 03/05/2020  Medication Sig  . acetaminophen (TYLENOL) 500 MG tablet Take 1,000 mg by mouth every 8 (eight) hours as needed. For leg pain and arthritis. May have additional 1000 mg for break through pain once a day prn  . alum & mag hydroxide-simeth (MAALOX PLUS) 400-400-40 MG/5ML suspension Take 20 mLs by mouth 2 (two) times daily as needed for indigestion.   . Alum & Mag Hydroxide-Simeth (MYLANTA PO) Take by mouth. liquid; regular; amt: 4 tsp.; oral Special Instructions: after breakfast Once A Day 09:00 AM  . Balsam [place]-Castor Oil (VENELEX) OINT Apply topically to sacrum and bilateral buttocks every shift and as needed  . Brinzolamide-Brimonidine (SIMBRINZA) 1-0.2 % SUSP One  drop into both eyes three times a day   . Calcium Carbonate-Vitamin D (CALCIUM-VITAMIN D) 500-200 MG-UNIT tablet Take 1 tablet by mouth 2 (two) times daily.  . citalopram (CELEXA) 10 MG tablet Take 30 mg by mouth daily.   . cycloSPORINE (RESTASIS) 0.05 % ophthalmic emulsion Place 1 drop into both eyes 2 (two) times daily.   Marland Kitchen docusate sodium (COLACE) 100 MG capsule Take 100 mg by mouth daily.   Marland Kitchen donepezil (ARICEPT) 5 MG tablet Take 5 mg by mouth daily. For dementia  . furosemide (LASIX) 20 MG tablet Take 20 mg by mouth daily.  . hydrALAZINE (APRESOLINE) 50 MG tablet Take 50 mg by mouth 2 (two) times a day.   . ipratropium-albuterol (DUONEB) 0.5-2.5 (3) MG/3ML SOLN Take 3 mLs by nebulization every 6 (six) hours as needed.  . latanoprost (XALATAN) 0.005 % ophthalmic solution Place 1 drop into both eyes at bedtime.  Marland Kitchen loratadine (CLARITIN) 10 MG tablet Take 10 mg by mouth daily.  Marland Kitchen LORazepam (ATIVAN) 0.5 MG tablet Take 0.5 tablets (0.25 mg total) by mouth daily.  .  nitroGLYCERIN (NITROSTAT) 0.4 MG SL tablet Place 0.4 mg under the tongue every 5 (five) minutes as needed for chest pain.   . NON FORMULARY Diet Type:  Regular, NAS  . OXYGEN Inhale 2 L/min into the lungs continuous. Wean to keep sats above 90%  . pantoprazole (PROTONIX) 40 MG tablet Take 40 mg by mouth daily. Do not crush.   Marland Kitchen  potassium chloride (K-DUR,KLOR-CON) 10 MEQ tablet Take 10 mEq by mouth daily.  . Saline (OCEAN NASAL SPRAY NA) Place 1 spray into the nose every 2 (two) hours as needed.   No facility-administered encounter medications on file as of 03/05/2020.     SIGNIFICANT DIAGNOSTIC EXAMS   PREVIOUS ;   07-22-19: right forearm x-ray  1. Impacted fracture of the radial neck junction. 2. Suspect additional nondisplaced fracture of the coronoid process. 3. Right oval swelling and trace joint effusion. 4. Degenerative arthrosis of the wrist and elbow. Enthesopathic changes of the olecranon.  07-22-19: right shoulder x-ray  1. The osseous structures appear diffusely demineralized which may limit detection of small or nondisplaced fractures. 2. No acute fracture or traumatic malalignment. 3. High-riding appearance of the humeral head likely reflecting rotator cuff insufficiency. 4. Moderate osteoarthrosis of the acromioclavicular and glenohumeral joints.   NO NEW EXAMS.   LABS REVIEWED PREVIOUS:   03-11-19: wbc 4.7; hgb 11.4; hct 36.7; mcv 83.4; plt 83.4; plt 211; glucose 87; bun 44; creat 1.32; k+ 3.8; na++ 139; ca 9.4; liver normal albumin 4.0  09-06-19: wbc 4.4; hgb 10.8; hct 34.9; mcv 84.7 plt 261; glucose 81; bun 26; creat 1.21; k+ 3.5; na++ 141; ca 9.1 liver normal albumin 3.5    02-24-20: wbc 4.9; hgb 9.5; hct 30.0 mcv 82.2 plt 160; glucose 82; bun 49; creat 1.54; k+ 3.4; na++ 140; ca 8.7 liver normal albumin 3.4 02-27-20: glucose 84; bun 41; creat 1.29; k+ 3.9; na++ 142; ca 9.0   NO NEW LABS.    Review of Systems  Constitutional: Negative for malaise/fatigue.    Respiratory: Negative for cough and shortness of breath.   Cardiovascular: Positive for leg swelling. Negative for chest pain and palpitations.  Gastrointestinal: Negative for abdominal pain, constipation and heartburn.  Musculoskeletal: Negative for back pain, joint pain and myalgias.  Skin: Negative.   Neurological: Negative for dizziness.  Psychiatric/Behavioral: The patient is not nervous/anxious.     Physical Exam Constitutional:      General: She is not in acute distress.    Appearance: She is well-developed. She is obese. She is not diaphoretic.  Eyes:     Comments: History of bilateral cataracts with implants     Neck:     Thyroid: No thyromegaly.  Cardiovascular:     Rate and Rhythm: Normal rate and regular rhythm.     Heart sounds: Normal heart sounds.     Comments: Pedal pulses faint  Pulmonary:     Effort: Pulmonary effort is normal. No respiratory distress.     Breath sounds: Normal breath sounds.  Abdominal:     General: Bowel sounds are normal. There is no distension.     Palpations: Abdomen is soft.     Tenderness: There is no abdominal tenderness.  Musculoskeletal:     Cervical back: Neck supple.     Right lower leg: Edema present.     Left lower leg: Edema present.     Comments:  Is able to move all extremities Has significant lower extremity edema    Lymphadenopathy:     Cervical: No cervical adenopathy.  Skin:    General: Skin is warm and dry.     Comments: Bilateral lower extremities discolored     Neurological:     Mental Status: She is alert and oriented to person, place, and time.  Psychiatric:        Mood and Affect: Mood normal.       ASSESSMENT/ PLAN:  TODAY  1. Chronic diastolic CHF (congestive heart failure)  2. Late onset alzheimer's disease with behavioral disturbance 3. 3b chronic kidney disease  Will increase lasix to 40 mg daily  Will check BMP 03-12-20 Will get screening mammogram Will continue to monitor her status.    MD is aware of resident's narcotic use and is in agreement with current plan of care. We will attempt to wean resident as appropriate.  Ok Edwards NP [facility]  Contact [phone] Monday through Friday 8am- 5pm  After hours call [phone]

## 2020-03-11 DIAGNOSIS — F411 Generalized anxiety disorder: Secondary | ICD-10-CM | POA: Diagnosis not present

## 2020-03-12 ENCOUNTER — Other Ambulatory Visit (HOSPITAL_COMMUNITY)
Admission: RE | Admit: 2020-03-12 | Discharge: 2020-03-12 | Disposition: A | Discharge status: Home / Self Care | Payer: Medicare Other | Source: Skilled Nursing Facility | Attending: Adult Health | Admitting: Adult Health

## 2020-03-12 DIAGNOSIS — I130 Hypertensive heart and chronic kidney disease with heart failure and stage 1 through stage 4 chronic kidney disease, or unspecified chronic kidney disease: Secondary | ICD-10-CM | POA: Diagnosis not present

## 2020-03-12 LAB — BASIC METABOLIC PANEL
Anion gap: 10 (ref 5–15)
BUN: 45 mg/dL — ABNORMAL HIGH (ref 8–23)
CO2: 31 mmol/L (ref 22–32)
Calcium: 9 mg/dL (ref 8.9–10.3)
Chloride: 100 mmol/L (ref 98–111)
Creatinine, Ser: 1.26 mg/dL — ABNORMAL HIGH (ref 0.44–1.00)
GFR calc Af Amer: 42 mL/min — ABNORMAL LOW (ref >60)
GFR calc non Af Amer: 36 mL/min — ABNORMAL LOW (ref >60)
Glucose, Bld: 87 mg/dL (ref 70–99)
Potassium: 3.7 mmol/L (ref 3.5–5.1)
Sodium: 141 mmol/L (ref 135–145)

## 2020-03-13 ENCOUNTER — Other Ambulatory Visit: Payer: Self-pay | Admitting: Adult Health

## 2020-03-13 MED ORDER — LORAZEPAM 0.5 MG PO TABS: 0.2500 mg | ORAL_TABLET | Freq: Every day | ORAL | 0 refills | Status: DC

## 2020-03-16 ENCOUNTER — Non-Acute Institutional Stay (SKILLED_NURSING_FACILITY): Payer: Medicare Other | Admitting: Internal Medicine

## 2020-03-16 ENCOUNTER — Encounter: Payer: Self-pay | Admitting: Internal Medicine

## 2020-03-16 DIAGNOSIS — I10 Essential (primary) hypertension: Secondary | ICD-10-CM | POA: Diagnosis not present

## 2020-03-16 DIAGNOSIS — G301 Alzheimer's disease with late onset: Secondary | ICD-10-CM | POA: Diagnosis not present

## 2020-03-16 DIAGNOSIS — F0281 Dementia in other diseases classified elsewhere with behavioral disturbance: Secondary | ICD-10-CM

## 2020-03-16 DIAGNOSIS — N1832 Chronic kidney disease, stage 3b: Secondary | ICD-10-CM

## 2020-03-16 NOTE — Assessment & Plan Note (Signed)
Blood pressure elevated in the context of noncompliance with medications

## 2020-03-16 NOTE — Patient Instructions (Signed)
See assessment and plan under each diagnosis in the problem list and acutely for this visit 

## 2020-03-16 NOTE — Assessment & Plan Note (Addendum)
See 03/16/2020.  Behavior manifested as refusing meals and medications and hallucinating that legs paralyzed. Update B12 and TSH values. Psych reassessment

## 2020-03-16 NOTE — Assessment & Plan Note (Addendum)
Renal function is current and improved with creatinine 1.26 and GFR 42 despite elevated BP

## 2020-03-16 NOTE — Progress Notes (Signed)
NURSING HOME LOCATION:  [place] ROOM NUMBER:  122/D  CODE STATUS: DNR   PCP: Gerlene Fee, NP   This is a nursing facility follow up for specific acute issue of behavioral issues manifested as noncompliance with oral intake of medications and meals.  Interim medical record and care since last [place] visit was updated with review of diagnostic studies and change in clinical status since last visit were documented.  HPI: Staff reports that 2 days ago she refused all 3 meals and essentially stayed in bed all day.  Her nurse was able to get her to drink a carton of chocolate milk.  Yesterday she agreed to get dressed and walked to a wheelchair to be transported to see a visitor.  Prior to that she had said that her legs were paralyzed. Chart includes diagnoses of anxiety/depression and dementia with behavioral disturbance, the latter first noted 11/07/2007. She is on donepezil, low-dose lorazepam and low-dose citalopram.  No CNS imaging results found in Epic to help assess etiology of dementia. Chemistries and electrolytes are current.  Basically the only abnormality is mild renal insufficiency which has improved.  Creatinine was 1.26 and GFR 42.  Anemia had progressed slightly in late June with H/H of 9.5/30.  Indices were normochromic, normocytic.  TSH was low normal with a value of 0.369 in 2019.  At that time B12 level was normal at 605.  Review of systems: Dementia invalidated responses.  She is unable to give me the date, even the year.  She could not name the president.  She did indicate his running might was a woman.  She states she is not taking medicines because they will come out in her diaper.  She is also apparently concerned about her right breast swelling with the medicines.  She describes her self as "all bones".  Physical exam:  Pertinent or positive findings: Her room suggest hoarding behavior with multiple possessions completely covering the bed and  stacked in bedside areas.  Arcus senilis is present.  She is completely edentulous.  Grade 1 systolic murmur is noted.  Chest was surprisingly clear except for minor rales in the left lower lobe.  Abdomen is protuberant.  Tense edema of the lower extremities is present almost to the knees.  Pedal pulses are not palpable.  She has a pigmented nevus over the left malar area.  She has hyperpigmented lesions over the posterior thorax.  General appearance: Adequately nourished; no acute distress, increased work of breathing is present.   Lymphatic: No lymphadenopathy about the head, neck, axilla. Eyes: No conjunctival inflammation or lid edema is present. There is no scleral icterus. Ears:  External ear exam shows no significant lesions or deformities.   Nose:  External nasal examination shows no deformity or inflammation. Nasal mucosa are pink and moist without lesions, exudates Oral exam:  Lips and gums are healthy appearing. There is no oropharyngeal erythema or exudate. Neck:  No thyromegaly, masses, tenderness noted.    Heart:  Normal rate and regular rhythm. S1 and S2 normal without gallop, click, rub .  Lungs:  without wheezes, rhonchi, rubs. Abdomen: Bowel sounds are normal. Abdomen is soft and nontender with no organomegaly, hernias, masses. GU: Deferred  Extremities:  No cyanosis, clubbing Neurologic exam :Balance, [place], finger to nose testing could not be completed due to clinical state Skin: Warm & dry w/o tenting. No significant  rash.  See summary under each active problem in the Problem List with associated  updated therapeutic plan

## 2020-03-18 ENCOUNTER — Other Ambulatory Visit (HOSPITAL_COMMUNITY)
Admission: RE | Admit: 2020-03-18 | Discharge: 2020-03-18 | Disposition: A | Discharge status: Home / Self Care | Payer: Medicare Other | Source: Skilled Nursing Facility | Attending: Internal Medicine | Admitting: Internal Medicine

## 2020-03-18 DIAGNOSIS — I5032 Chronic diastolic (congestive) heart failure: Secondary | ICD-10-CM | POA: Insufficient documentation

## 2020-03-18 DIAGNOSIS — F411 Generalized anxiety disorder: Secondary | ICD-10-CM | POA: Diagnosis not present

## 2020-03-18 LAB — VITAMIN B12: Vitamin B-12: 529 pg/mL (ref 180–914)

## 2020-03-18 LAB — TSH: TSH: 1.548 u[IU]/mL (ref 0.350–4.500)

## 2020-03-21 ENCOUNTER — Telehealth: Payer: Self-pay | Admitting: Internal Medicine

## 2020-03-21 DIAGNOSIS — I272 Pulmonary hypertension, unspecified: Secondary | ICD-10-CM | POA: Diagnosis not present

## 2020-03-21 DIAGNOSIS — I517 Cardiomegaly: Secondary | ICD-10-CM | POA: Diagnosis not present

## 2020-03-21 DIAGNOSIS — J810 Acute pulmonary edema: Secondary | ICD-10-CM | POA: Diagnosis not present

## 2020-03-21 DIAGNOSIS — I509 Heart failure, unspecified: Secondary | ICD-10-CM | POA: Diagnosis not present

## 2020-03-21 NOTE — Telephone Encounter (Signed)
Nurse called this afternoon with concerns that resident had temp of 100.2, cough and sputum production.  Had initiated standing orders for robitussin q6h and tylenol.  Requesting CXR orders--2 view portable ordered.  Also ordered covid swab and isolation.

## 2020-03-23 ENCOUNTER — Non-Acute Institutional Stay (SKILLED_NURSING_FACILITY): Payer: Medicare Other | Admitting: Adult Health

## 2020-03-23 ENCOUNTER — Encounter: Payer: Self-pay | Admitting: Adult Health

## 2020-03-23 DIAGNOSIS — I5032 Chronic diastolic (congestive) heart failure: Secondary | ICD-10-CM

## 2020-03-23 DIAGNOSIS — N1832 Chronic kidney disease, stage 3b: Secondary | ICD-10-CM

## 2020-03-23 DIAGNOSIS — I130 Hypertensive heart and chronic kidney disease with heart failure and stage 1 through stage 4 chronic kidney disease, or unspecified chronic kidney disease: Secondary | ICD-10-CM | POA: Diagnosis not present

## 2020-03-23 NOTE — Progress Notes (Signed)
Location:    [place] Room Number: 122/D Place of Service:  SNF (31)   CODE STATUS: DNR  Allergies  Allergen Reactions  . Bee Venom Anaphylaxis  . Ace Inhibitors   . Angiotensin Receptor Blockers     Tongue swelling.  . Aspirin     Directed by physician  . Cabbage Other (See Comments)    Certain foods due to gout flares  . Latex Rash    Chief Complaint  Patient presents with  . Follow-up    X-ray Follow Up    HPI:  Her chest x-ray demonstrates mild chf. She is having shortness of breath; no cough; no fevers. She is on 02; but does take if off on a frequent basis. She is presently taking lasix 40 mg daily. Her edema is without significant change. She has a history of chronic diastolic congestive heart failure.   Past Medical History:  Diagnosis Date  . Anxiety   . Cellulitis 2016  . CHF (congestive heart failure) ([place])   . Chronic kidney disease   . Dementia ([place])   . Dysphagia   . Gait abnormality   . GERD (gastroesophageal reflux disease)   . Glaucoma   . Gout, unspecified 01/12/2010   Qualifier: Diagnosis of  By: Moshe Cipro MD, Joycelyn Schmid    . Hyperlipidemia   . Hypertension   . Muscle weakness   . Myocardial infarction [facility]) [place]  . Osteoarthritis   . Syncope     Past Surgical History:  Procedure Laterality Date  . APPENDECTOMY    . CATARACT EXTRACTION W/PHACO  08/04/2011   Procedure: CATARACT EXTRACTION PHACO AND INTRAOCULAR LENS PLACEMENT (IOC);  Surgeon: Tonny Branch;  Location: [facility] ORS;  Service: Ophthalmology;  Laterality: Right;  CDE=18.53  . CATARACT EXTRACTION W/PHACO  08/25/2011   Procedure: CATARACT EXTRACTION PHACO AND INTRAOCULAR LENS PLACEMENT (IOC);  Surgeon: Tonny Branch;  Location: [facility] ORS;  Service: Ophthalmology;  Laterality: Left;  CDE:14.76  . TONSILLECTOMY      Social History   Socioeconomic History  . Marital status: Divorced    Spouse name: Not on file  . Number of children: Not on file  . Years of  education: Not on file  . Highest education level: Not on file  Occupational History  . Occupation: retired   Tobacco Use  . Smoking status: Never Smoker  . Smokeless tobacco: Never Used  Vaping Use  . Vaping Use: Never used  Substance and Sexual Activity  . Alcohol use: No  . Drug use: No  . Sexual activity: Not Currently  Other Topics Concern  . Not on file  Social History Narrative   Long term resident [facility]    Social Determinants of Health   Financial Resource Strain:   . Difficulty of Paying Living Expenses:   Food Insecurity:   . Worried About Charity fundraiser in the Last Year:   . Arboriculturist in the Last Year:   Transportation Needs:   . Film/video editor (Medical):   Marland Kitchen Lack of Transportation (Non-Medical):   Physical Activity:   . Days of Exercise per Week:   . Minutes of Exercise per Session:   Stress:   . Feeling of Stress :   Social Connections:   . Frequency of Communication with Friends and Family:   . Frequency of Social Gatherings with Friends and Family:   . Attends Religious Services:   . Active Member of Clubs or Organizations:   .  Attends Archivist Meetings:   Marland Kitchen Marital Status:   Intimate Partner Violence:   . Fear of Current or Ex-Partner:   . Emotionally Abused:   Marland Kitchen Physically Abused:   . Sexually Abused:    Family History  Problem Relation Age of Onset  . Diabetes Father   . Diabetes Sister   . Diabetes Brother   . Stroke Sister   . Stroke Sister   . Anesthesia problems Neg Hx   . Hypotension Neg Hx   . Malignant hyperthermia Neg Hx   . Pseudochol deficiency Neg Hx       VITAL SIGNS BP (!) 146/66   Pulse 62   Temp 98.2 F (36.8 C) (Oral)   Resp 18   Ht 4\' 11"  (1.499 m)   Wt 163 lb 6.4 oz (74.1 kg)   SpO2 95%   BMI 33.00 kg/m   Outpatient Encounter Medications as of 03/23/2020  Medication Sig  . acetaminophen (TYLENOL) 500 MG tablet Take 1,000 mg by mouth every 8 (eight) hours as needed. For leg pain  and arthritis. May have additional 1000 mg for break through pain once a day prn  . alum & mag hydroxide-simeth (MAALOX PLUS) 400-400-40 MG/5ML suspension Take 20 mLs by mouth 2 (two) times daily as needed for indigestion.   . Alum & Mag Hydroxide-Simeth (MYLANTA PO) Take by mouth. liquid; regular; amt: 4 tsp.; oral Special Instructions: after breakfast Once A Day 09:00 AM  . Balsam [place]-Castor Oil (VENELEX) OINT Apply topically to sacrum and bilateral buttocks every shift and as needed  . Brinzolamide-Brimonidine (SIMBRINZA) 1-0.2 % SUSP One  drop into both eyes three times a day   . Calcium Carbonate-Vitamin D (CALCIUM-VITAMIN D) 500-200 MG-UNIT tablet Take 1 tablet by mouth 2 (two) times daily.  . citalopram (CELEXA) 10 MG tablet Take 30 mg by mouth daily.   . cycloSPORINE (RESTASIS) 0.05 % ophthalmic emulsion Place 1 drop into both eyes 2 (two) times daily.   Marland Kitchen docusate sodium (COLACE) 100 MG capsule Take 100 mg by mouth daily.   Marland Kitchen donepezil (ARICEPT) 5 MG tablet Take 5 mg by mouth daily. For dementia  . furosemide (LASIX) 20 MG tablet Take 40 mg by mouth 2 (two) times daily.   . hydrALAZINE (APRESOLINE) 50 MG tablet Take 50 mg by mouth 2 (two) times a day.   . ipratropium-albuterol (DUONEB) 0.5-2.5 (3) MG/3ML SOLN Take 3 mLs by nebulization every 6 (six) hours as needed.  . latanoprost (XALATAN) 0.005 % ophthalmic solution Place 1 drop into both eyes at bedtime.  Marland Kitchen loratadine (CLARITIN) 10 MG tablet Take 10 mg by mouth daily.  Marland Kitchen LORazepam (ATIVAN) 0.5 MG tablet Take 0.5 tablets (0.25 mg total) by mouth daily.  . nitroGLYCERIN (NITROSTAT) 0.4 MG SL tablet Place 0.4 mg under the tongue every 5 (five) minutes as needed for chest pain.   . NON FORMULARY Diet Type:  Regular, NAS  . OXYGEN Inhale 2 L/min into the lungs continuous. Wean to keep sats above 90%  . pantoprazole (PROTONIX) 40 MG tablet Take 40 mg by mouth daily. Do not crush.   . potassium chloride (K-DUR,KLOR-CON) 10 MEQ tablet  Take 20 mEq by mouth daily.   . Saline (OCEAN NASAL SPRAY NA) Place 1 spray into the nose every 2 (two) hours as needed.   No facility-administered encounter medications on file as of 03/23/2020.     SIGNIFICANT DIAGNOSTIC EXAMS   PREVIOUS ;   07-22-19: right forearm x-ray  1.  Impacted fracture of the radial neck junction. 2. Suspect additional nondisplaced fracture of the coronoid process. 3. Right oval swelling and trace joint effusion. 4. Degenerative arthrosis of the wrist and elbow. Enthesopathic changes of the olecranon.  07-22-19: right shoulder x-ray  1. The osseous structures appear diffusely demineralized which may limit detection of small or nondisplaced fractures. 2. No acute fracture or traumatic malalignment. 3. High-riding appearance of the humeral head likely reflecting rotator cuff insufficiency. 4. Moderate osteoarthrosis of the acromioclavicular and glenohumeral joints.   TODAY  03-21-20: chest x-ray: Mild congestive heart failure   LABS REVIEWED PREVIOUS:   03-11-19: wbc 4.7; hgb 11.4; hct 36.7; mcv 83.4; plt 83.4; plt 211; glucose 87; bun 44; creat 1.32; k+ 3.8; na++ 139; ca 9.4; liver normal albumin 4.0  09-06-19: wbc 4.4; hgb 10.8; hct 34.9; mcv 84.7 plt 261; glucose 81; bun 26; creat 1.21; k+ 3.5; na++ 141; ca 9.1 liver normal albumin 3.5    02-24-20: wbc 4.9; hgb 9.5; hct 30.0 mcv 82.2 plt 160; glucose 82; bun 49; creat 1.54; k+ 3.4; na++ 140; ca 8.7 liver normal albumin 3.4 02-27-20: glucose 84; bun 41; creat 1.29; k+ 3.9; na++ 142; ca 9.0   TODAY  03-12-20: glucose 87; bun 45; creat 1.26; k+ 3.7; na++ 141; ca 9.0 03-18-20: tsh 1.548 vit B 12: 529  Review of Systems  Constitutional: Negative for malaise/fatigue.  Respiratory: Positive for shortness of breath. Negative for cough.   Cardiovascular: Positive for leg swelling. Negative for chest pain and palpitations.  Gastrointestinal: Negative for abdominal pain, constipation and heartburn.    Musculoskeletal: Negative for back pain, joint pain and myalgias.  Skin: Negative.   Neurological: Negative for dizziness.  Psychiatric/Behavioral: The patient is not nervous/anxious.       Physical Exam Constitutional:      General: She is not in acute distress.    Appearance: She is well-developed. She is obese. She is not diaphoretic.  Eyes:     Comments: History of bilateral cataracts with implants      Neck:     Thyroid: No thyromegaly.  Cardiovascular:     Rate and Rhythm: Normal rate and regular rhythm.     Heart sounds: Normal heart sounds.     Comments: Pedal pulses faint  Pulmonary:     Effort: Pulmonary effort is normal. No respiratory distress.     Breath sounds: No wheezing, rhonchi or rales.     Comments: Using 02 Breath sounds diminished bilaterally  Abdominal:     General: Bowel sounds are normal. There is no distension.     Palpations: Abdomen is soft.     Tenderness: There is no abdominal tenderness.  Musculoskeletal:     Cervical back: Neck supple.     Right lower leg: Edema present.     Left lower leg: Edema present.     Comments: Is able to move all extremities Has significant lower extremity edema  Lymphadenopathy:     Cervical: No cervical adenopathy.  Skin:    General: Skin is warm and dry.     Comments: Bilateral lower extremities discolored   Neurological:     Mental Status: She is alert and oriented to person, place, and time.  Psychiatric:        Mood and Affect: Mood normal.        ASSESSMENT/ PLAN:  TODAY  1. Hypertensive heart and kidney disease with chronic diastolic congestive heart failure and stage 3b chronic kidney disease:  Is worse:  Will increase lasix to 40 mg twice daily  Will increase k+ to 20 meq Will check BMP on 03-30-20.  Will monitor her status.    MD is aware of resident's narcotic use and is in agreement with current plan of care. We will attempt to wean resident as appropriate.  Ok Edwards NP [facility]  Adult Medicine  Contact [phone] Monday through Friday 8am- 5pm  After hours call [phone]

## 2020-03-25 DIAGNOSIS — F411 Generalized anxiety disorder: Secondary | ICD-10-CM | POA: Diagnosis not present

## 2020-03-30 ENCOUNTER — Encounter: Payer: Self-pay | Admitting: Adult Health

## 2020-03-30 ENCOUNTER — Encounter (HOSPITAL_COMMUNITY)
Admission: RE | Admit: 2020-03-30 | Discharge: 2020-03-30 | Disposition: A | Discharge status: Home / Self Care | Payer: Medicare Other | Source: Skilled Nursing Facility | Attending: Adult Health | Admitting: Adult Health

## 2020-03-30 ENCOUNTER — Non-Acute Institutional Stay: Payer: Self-pay | Admitting: Adult Health

## 2020-03-30 ENCOUNTER — Encounter (SKILLED_NURSING_FACILITY): Payer: Medicare Other | Admitting: Adult Health

## 2020-03-30 DIAGNOSIS — I130 Hypertensive heart and chronic kidney disease with heart failure and stage 1 through stage 4 chronic kidney disease, or unspecified chronic kidney disease: Secondary | ICD-10-CM | POA: Insufficient documentation

## 2020-03-30 DIAGNOSIS — E876 Hypokalemia: Secondary | ICD-10-CM

## 2020-03-30 LAB — BASIC METABOLIC PANEL
Anion gap: 11 (ref 5–15)
BUN: 26 mg/dL — ABNORMAL HIGH (ref 8–23)
CO2: 38 mmol/L — ABNORMAL HIGH (ref 22–32)
Calcium: 9 mg/dL (ref 8.9–10.3)
Chloride: 91 mmol/L — ABNORMAL LOW (ref 98–111)
Creatinine, Ser: 1.33 mg/dL — ABNORMAL HIGH (ref 0.44–1.00)
GFR calc Af Amer: 39 mL/min — ABNORMAL LOW (ref >60)
GFR calc non Af Amer: 34 mL/min — ABNORMAL LOW (ref >60)
Glucose, Bld: 88 mg/dL (ref 70–99)
Potassium: 2.9 mmol/L — ABNORMAL LOW (ref 3.5–5.1)
Sodium: 140 mmol/L (ref 135–145)

## 2020-03-30 NOTE — Progress Notes (Signed)
Location:    [place] Room Number: 122/D Place of Service:  SNF (31)   CODE STATUS: DNR  Allergies  Allergen Reactions  . Bee Venom Anaphylaxis  . Ace Inhibitors   . Angiotensin Receptor Blockers     Tongue swelling.  . Aspirin     Directed by physician  . Cabbage Other (See Comments)    Certain foods due to gout flares  . Latex Rash    Chief Complaint  Patient presents with  . Follow-up    Lab Follow Up    HPI:  Her k+ is 2.9 and is taking k+ 20 meq daily. There are no reports of uncontrolled pain or muscle spasms. No reports of missed doses.   Past Medical History:  Diagnosis Date  . Anxiety   . Cellulitis 2016  . CHF (congestive heart failure) ([place])   . Chronic kidney disease   . Dementia ([place])   . Dysphagia   . Gait abnormality   . GERD (gastroesophageal reflux disease)   . Glaucoma   . Gout, unspecified 01/12/2010   Qualifier: Diagnosis of  By: Moshe Cipro MD, Joycelyn Schmid    . Hyperlipidemia   . Hypertension   . Muscle weakness   . Myocardial infarction [facility]) [place]  . Osteoarthritis   . Syncope     Past Surgical History:  Procedure Laterality Date  . APPENDECTOMY    . CATARACT EXTRACTION W/PHACO  08/04/2011   Procedure: CATARACT EXTRACTION PHACO AND INTRAOCULAR LENS PLACEMENT (IOC);  Surgeon: Tonny Branch;  Location: [facility] ORS;  Service: Ophthalmology;  Laterality: Right;  CDE=18.53  . CATARACT EXTRACTION W/PHACO  08/25/2011   Procedure: CATARACT EXTRACTION PHACO AND INTRAOCULAR LENS PLACEMENT (IOC);  Surgeon: Tonny Branch;  Location: [facility] ORS;  Service: Ophthalmology;  Laterality: Left;  CDE:14.76  . TONSILLECTOMY      Social History   Socioeconomic History  . Marital status: Divorced    Spouse name: Not on file  . Number of children: Not on file  . Years of education: Not on file  . Highest education level: Not on file  Occupational History  . Occupation: retired   Tobacco Use  . Smoking status: Never Smoker  .  Smokeless tobacco: Never Used  Vaping Use  . Vaping Use: Never used  Substance and Sexual Activity  . Alcohol use: No  . Drug use: No  . Sexual activity: Not Currently  Other Topics Concern  . Not on file  Social History Narrative   Long term resident [facility]    Social Determinants of Health   Financial Resource Strain:   . Difficulty of Paying Living Expenses:   Food Insecurity:   . Worried About Charity fundraiser in the Last Year:   . Arboriculturist in the Last Year:   Transportation Needs:   . Film/video editor (Medical):   Marland Kitchen Lack of Transportation (Non-Medical):   Physical Activity:   . Days of Exercise per Week:   . Minutes of Exercise per Session:   Stress:   . Feeling of Stress :   Social Connections:   . Frequency of Communication with Friends and Family:   . Frequency of Social Gatherings with Friends and Family:   . Attends Religious Services:   . Active Member of Clubs or Organizations:   . Attends Archivist Meetings:   Marland Kitchen Marital Status:   Intimate Partner Violence:   . Fear of Current or Ex-Partner:   .  Emotionally Abused:   Marland Kitchen Physically Abused:   . Sexually Abused:    Family History  Problem Relation Age of Onset  . Diabetes Father   . Diabetes Sister   . Diabetes Brother   . Stroke Sister   . Stroke Sister   . Anesthesia problems Neg Hx   . Hypotension Neg Hx   . Malignant hyperthermia Neg Hx   . Pseudochol deficiency Neg Hx       VITAL SIGNS BP (!) 142/70   Pulse 70   Temp 98.2 F (36.8 C) (Oral)   Resp 20   Ht 4\' 11"  (1.499 m)   Wt 163 lb 6.4 oz (74.1 kg)   SpO2 96%   BMI 33.00 kg/m   Outpatient Encounter Medications as of 03/30/2020  Medication Sig  . acetaminophen (TYLENOL) 500 MG tablet Take 1,000 mg by mouth every 8 (eight) hours as needed. For leg pain and arthritis. May have additional 1000 mg for break through pain once a day prn  . alum & mag hydroxide-simeth (MAALOX PLUS) 400-400-40 MG/5ML suspension Amt: 4  teaspoons; oral Special Instructions: for indigestion/heartburn Twice A Day - PRN PRN 1, PRN 2  . Alum & Mag Hydroxide-Simeth (MYLANTA PO) Take by mouth. liquid; regular; amt: 4 tsp.; oral Special Instructions: after breakfast Once A Day 09:00 AM  . Balsam [place]-Castor Oil (VENELEX) OINT Apply topically to sacrum and bilateral buttocks every shift and as needed  . Brinzolamide-Brimonidine (SIMBRINZA) 1-0.2 % SUSP One  drop into both eyes three times a day   . Calcium Carbonate-Vitamin D (CALCIUM-VITAMIN D) 500-200 MG-UNIT tablet Take 1 tablet by mouth 2 (two) times daily.  . citalopram (CELEXA) 10 MG tablet Take 30 mg by mouth daily.   . cycloSPORINE (RESTASIS) 0.05 % ophthalmic emulsion Place 1 drop into both eyes 2 (two) times daily.   Marland Kitchen Dextromethorphan-guaiFENesin (ROBITUSSIN DM PO) 15 ml by mouth every 6 hours for cough/congestion. May give up to 48 hrs. Inform physician immediately if cough or congestion is associated with fever or SOB Every 6 Hours - PRN  . docusate sodium (COLACE) 100 MG capsule Take 100 mg by mouth daily.   Marland Kitchen donepezil (ARICEPT) 5 MG tablet Take 5 mg by mouth daily. For dementia  . furosemide (LASIX) 20 MG tablet Take 40 mg by mouth 2 (two) times daily.   . hydrALAZINE (APRESOLINE) 50 MG tablet Take 50 mg by mouth 2 (two) times a day.   . ipratropium-albuterol (DUONEB) 0.5-2.5 (3) MG/3ML SOLN Take 3 mLs by nebulization every 6 (six) hours as needed.  . latanoprost (XALATAN) 0.005 % ophthalmic solution Place 1 drop into both eyes at bedtime.  Marland Kitchen loratadine (CLARITIN) 10 MG tablet Take 10 mg by mouth daily.  Marland Kitchen LORazepam (ATIVAN) 0.5 MG tablet Take 0.5 tablets (0.25 mg total) by mouth daily.  . nitroGLYCERIN (NITROSTAT) 0.4 MG SL tablet Place 0.4 mg under the tongue every 5 (five) minutes as needed for chest pain.   . NON FORMULARY Diet Type:  Regular, NAS  . OXYGEN Inhale 2 L/min into the lungs continuous. Wean to keep sats above 90%  . pantoprazole (PROTONIX) 40 MG  tablet Take 40 mg by mouth daily. Do not crush.   . potassium chloride (K-DUR,KLOR-CON) 10 MEQ tablet Take 20 mEq by mouth 2 (two) times daily.   . [DISCONTINUED] Saline (OCEAN NASAL SPRAY NA) Place 1 spray into the nose every 2 (two) hours as needed.   No facility-administered encounter medications on file as of  03/30/2020.     SIGNIFICANT DIAGNOSTIC EXAMS   PREVIOUS ;   07-22-19: right forearm x-ray  1. Impacted fracture of the radial neck junction. 2. Suspect additional nondisplaced fracture of the coronoid process. 3. Right oval swelling and trace joint effusion. 4. Degenerative arthrosis of the wrist and elbow. Enthesopathic changes of the olecranon.  07-22-19: right shoulder x-ray  1. The osseous structures appear diffusely demineralized which may limit detection of small or nondisplaced fractures. 2. No acute fracture or traumatic malalignment. 3. High-riding appearance of the humeral head likely reflecting rotator cuff insufficiency. 4. Moderate osteoarthrosis of the acromioclavicular and glenohumeral joints.  03-21-20: chest x-ray: Mild congestive heart failure   NO NEW LABS.   LABS REVIEWED PREVIOUS:   03-11-19: wbc 4.7; hgb 11.4; hct 36.7; mcv 83.4; plt 83.4; plt 211; glucose 87; bun 44; creat 1.32; k+ 3.8; na++ 139; ca 9.4; liver normal albumin 4.0  09-06-19: wbc 4.4; hgb 10.8; hct 34.9; mcv 84.7 plt 261; glucose 81; bun 26; creat 1.21; k+ 3.5; na++ 141; ca 9.1 liver normal albumin 3.5    02-24-20: wbc 4.9; hgb 9.5; hct 30.0 mcv 82.2 plt 160; glucose 82; bun 49; creat 1.54; k+ 3.4; na++ 140; ca 8.7 liver normal albumin 3.4 02-27-20: glucose 84; bun 41; creat 1.29; k+ 3.9; na++ 142; ca 9.0  03-12-20: glucose 87; bun 45; creat 1.26; k+ 3.7; na++ 141; ca 9.0 03-18-20: tsh 1.548 vit B 12: 529  TODAY  03-30-20: glucose 88; bun 26; creat 1.33; k+ 2.9; na++ 140; ca 9.0    Review of Systems  Constitutional: Negative for malaise/fatigue.  Respiratory: Positive for shortness of  breath. Negative for cough.   Cardiovascular: Negative for chest pain, palpitations and leg swelling.  Gastrointestinal: Negative for abdominal pain, constipation and heartburn.  Musculoskeletal: Negative for back pain, joint pain and myalgias.  Skin: Negative.   Neurological: Negative for dizziness.  Psychiatric/Behavioral: The patient is not nervous/anxious.     Physical Exam Constitutional:      General: She is not in acute distress.    Appearance: She is well-developed. She is obese. She is not diaphoretic.  Eyes:     Comments: History of bilateral cataracts with implants       Neck:     Thyroid: No thyromegaly.  Cardiovascular:     Rate and Rhythm: Normal rate. Rhythm irregular.     Heart sounds: Normal heart sounds.     Comments: Pedal pulses faint  Pulmonary:     Effort: Pulmonary effort is normal. No respiratory distress.     Comments: Using 02 Breath sounds diminished bilaterally  Abdominal:     General: Bowel sounds are normal. There is no distension.     Palpations: Abdomen is soft.     Tenderness: There is no abdominal tenderness.  Musculoskeletal:     Cervical back: Neck supple.     Right lower leg: Edema present.     Left lower leg: Edema present.     Comments: Is able to move all extremities Has significant lower extremity edema   Lymphadenopathy:     Cervical: No cervical adenopathy.  Skin:    General: Skin is warm and dry.     Comments: Bilateral lower extremities discolored    Neurological:     Mental Status: She is alert. Mental status is at baseline.  Psychiatric:        Mood and Affect: Mood normal.       ASSESSMENT/ PLAN:  TODAY  1. Hypokalemia:  is worse k+ 2.9 will increase to k+ to 20 meq twice daily and will give k+ 20 meq three times today will repeat k+ in the AM will monitor her status.   MD is aware of resident's narcotic use and is in agreement with current plan of care. We will attempt to wean resident as appropriate.  Ok Edwards NP [facility] Adult Medicine  Contact [phone] Monday through Friday 8am- 5pm  After hours call [phone]

## 2020-03-31 ENCOUNTER — Encounter (HOSPITAL_COMMUNITY)
Admission: RE | Admit: 2020-03-31 | Discharge: 2020-03-31 | Disposition: A | Discharge status: Home / Self Care | Payer: Medicare Other | Source: Skilled Nursing Facility | Attending: Adult Health | Admitting: Adult Health

## 2020-03-31 DIAGNOSIS — E876 Hypokalemia: Secondary | ICD-10-CM | POA: Diagnosis not present

## 2020-03-31 DIAGNOSIS — I130 Hypertensive heart and chronic kidney disease with heart failure and stage 1 through stage 4 chronic kidney disease, or unspecified chronic kidney disease: Secondary | ICD-10-CM | POA: Diagnosis not present

## 2020-03-31 LAB — POTASSIUM: Potassium: 3.6 mmol/L (ref 3.5–5.1)

## 2020-03-31 NOTE — Progress Notes (Signed)
Location:   [place] Room Number: 035 Place of Service:  SNF (31)   CODE STATUS: dnr  Allergies  Allergen Reactions  . Bee Venom Anaphylaxis  . Ace Inhibitors   . Angiotensin Receptor Blockers     Tongue swelling.  . Aspirin     Directed by physician  . Cabbage Other (See Comments)    Certain foods due to gout flares  . Latex Rash    Chief Complaint  Patient presents with  . Acute Visit    follow up labs.     HPI:  Her k+ level is 2.9 and is taking k+ 20 meq daily. There are no reports of uncontrolled pain; no reports of muscle spasms.   Past Medical History:  Diagnosis Date  . Anxiety   . Cellulitis 2016  . CHF (congestive heart failure) ([place])   . Chronic kidney disease   . Dementia ([place])   . Dysphagia   . Gait abnormality   . GERD (gastroesophageal reflux disease)   . Glaucoma   . Gout, unspecified 01/12/2010   Qualifier: Diagnosis of  By: Moshe Cipro MD, Joycelyn Schmid    . Hyperlipidemia   . Hypertension   . Muscle weakness   . Myocardial infarction [facility] [place]  . Osteoarthritis   . Syncope     Past Surgical History:  Procedure Laterality Date  . APPENDECTOMY    . CATARACT EXTRACTION W/PHACO  08/04/2011   Procedure: CATARACT EXTRACTION PHACO AND INTRAOCULAR LENS PLACEMENT (IOC);  Surgeon: Tonny Branch;  Location: [facility]S;  Service: Ophthalmology;  Laterality: Right;  CDE=18.53  . CATARACT EXTRACTION W/PHACO  08/25/2011   Procedure: CATARACT EXTRACTION PHACO AND INTRAOCULAR LENS PLACEMENT (IOC);  Surgeon: Tonny Branch;  Location: [facility] ORS;  Service: Ophthalmology;  Laterality: Left;  CDE:14.76  . TONSILLECTOMY      Social History   Socioeconomic History  . Marital status: Divorced    Spouse name: Not on file  . Number of children: Not on file  . Years of education: Not on file  . Highest education level: Not on file  Occupational History  . Occupation: retired   Tobacco Use  . Smoking status: Never Smoker  . Smokeless tobacco: Never Used    Vaping Use  . Vaping Use: Never used  Substance and Sexual Activity  . Alcohol use: No  . Drug use: No  . Sexual activity: Not Currently  Other Topics Concern  . Not on file  Social History Narrative   Long term resident [facility]    Social Determinants of Health   Financial Resource Strain:   . Difficulty of Paying Living Expenses:   Food Insecurity:   . Worried About Charity fundraiser in the Last Year:   . Arboriculturist in the Last Year:   Transportation Needs:   . Film/video editor (Medical):   Marland Kitchen Lack of Transportation (Non-Medical):   Physical Activity:   . Days of Exercise per Week:   . Minutes of Exercise per Session:   Stress:   . Feeling of Stress :   Social Connections:   . Frequency of Communication with Friends and Family:   . Frequency of Social Gatherings with Friends and Family:   . Attends Religious Services:   . Active Member of Clubs or Organizations:   . Attends Archivist Meetings:   Marland Kitchen Marital Status:   Intimate Partner Violence:   . Fear of Current or Ex-Partner:   .  Emotionally Abused:   Marland Kitchen Physically Abused:   . Sexually Abused:    Family History  Problem Relation Age of Onset  . Diabetes Father   . Diabetes Sister   . Diabetes Brother   . Stroke Sister   . Stroke Sister   . Anesthesia problems Neg Hx   . Hypotension Neg Hx   . Malignant hyperthermia Neg Hx   . Pseudochol deficiency Neg Hx       VITAL SIGNS BP (!) 142/70   Pulse 70   Temp 97.8 F (36.6 C)   Resp 16   Ht 4\' 11"  (1.499 m)   Wt 163 lb (73.9 kg)   BMI 32.92 kg/m   Outpatient Encounter Medications as of 03/30/2020  Medication Sig  . acetaminophen (TYLENOL) 500 MG tablet Take 1,000 mg by mouth every 8 (eight) hours as needed. For leg pain and arthritis. May have additional 1000 mg for break through pain once a day prn  . alum & mag hydroxide-simeth (MAALOX PLUS) 400-400-40 MG/5ML suspension Amt: 4 teaspoons; oral Special Instructions: for  indigestion/heartburn Twice A Day - PRN PRN 1, PRN 2  . Alum & Mag Hydroxide-Simeth (MYLANTA PO) Take by mouth. liquid; regular; amt: 4 tsp.; oral Special Instructions: after breakfast Once A Day 09:00 AM  . Balsam [place]-Castor Oil (VENELEX) OINT Apply topically to sacrum and bilateral buttocks every shift and as needed  . Brinzolamide-Brimonidine (SIMBRINZA) 1-0.2 % SUSP One  drop into both eyes three times a day   . Calcium Carbonate-Vitamin D (CALCIUM-VITAMIN D) 500-200 MG-UNIT tablet Take 1 tablet by mouth 2 (two) times daily.  . citalopram (CELEXA) 10 MG tablet Take 30 mg by mouth daily.   . cycloSPORINE (RESTASIS) 0.05 % ophthalmic emulsion Place 1 drop into both eyes 2 (two) times daily.   Marland Kitchen Dextromethorphan-guaiFENesin (ROBITUSSIN DM PO) 15 ml by mouth every 6 hours for cough/congestion. May give up to 48 hrs. Inform physician immediately if cough or congestion is associated with fever or SOB Every 6 Hours - PRN  . docusate sodium (COLACE) 100 MG capsule Take 100 mg by mouth daily.   Marland Kitchen donepezil (ARICEPT) 5 MG tablet Take 5 mg by mouth daily. For dementia  . furosemide (LASIX) 20 MG tablet Take 40 mg by mouth 2 (two) times daily.   . hydrALAZINE (APRESOLINE) 50 MG tablet Take 50 mg by mouth 2 (two) times a day.   . ipratropium-albuterol (DUONEB) 0.5-2.5 (3) MG/3ML SOLN Take 3 mLs by nebulization every 6 (six) hours as needed.  . latanoprost (XALATAN) 0.005 % ophthalmic solution Place 1 drop into both eyes at bedtime.  Marland Kitchen loratadine (CLARITIN) 10 MG tablet Take 10 mg by mouth daily.  Marland Kitchen LORazepam (ATIVAN) 0.5 MG tablet Take 0.5 tablets (0.25 mg total) by mouth daily.  . nitroGLYCERIN (NITROSTAT) 0.4 MG SL tablet Place 0.4 mg under the tongue every 5 (five) minutes as needed for chest pain.   . NON FORMULARY Diet Type:  Regular, NAS  . OXYGEN Inhale 2 L/min into the lungs continuous. Wean to keep sats above 90%  . pantoprazole (PROTONIX) 40 MG tablet Take 40 mg by mouth daily. Do not  crush.   . potassium chloride (K-DUR,KLOR-CON) 10 MEQ tablet Take 20 mEq by mouth  daily.    No facility-administered encounter medications on file as of 03/30/2020.     SIGNIFICANT DIAGNOSTIC EXAMS   PREVIOUS ;   07-22-19: right forearm x-ray  1. Impacted fracture of the radial neck junction. 2. Suspect  additional nondisplaced fracture of the coronoid process. 3. Right oval swelling and trace joint effusion. 4. Degenerative arthrosis of the wrist and elbow. Enthesopathic changes of the olecranon.  07-22-19: right shoulder x-ray  1. The osseous structures appear diffusely demineralized which may limit detection of small or nondisplaced fractures. 2. No acute fracture or traumatic malalignment. 3. High-riding appearance of the humeral head likely reflecting rotator cuff insufficiency. 4. Moderate osteoarthrosis of the acromioclavicular and glenohumeral joints.  03-21-20: chest x-ray: Mild congestive heart failure   NO NEW EXAMS.   LABS REVIEWED PREVIOUS:   03-11-19: wbc 4.7; hgb 11.4; hct 36.7; mcv 83.4; plt 83.4; plt 211; glucose 87; bun 44; creat 1.32; k+ 3.8; na++ 139; ca 9.4; liver normal albumin 4.0  09-06-19: wbc 4.4; hgb 10.8; hct 34.9; mcv 84.7 plt 261; glucose 81; bun 26; creat 1.21; k+ 3.5; na++ 141; ca 9.1 liver normal albumin 3.5    02-24-20: wbc 4.9; hgb 9.5; hct 30.0 mcv 82.2 plt 160; glucose 82; bun 49; creat 1.54; k+ 3.4; na++ 140; ca 8.7 liver normal albumin 3.4 02-27-20: glucose 84; bun 41; creat 1.29; k+ 3.9; na++ 142; ca 9.0  03-12-20: glucose 87; bun 45; creat 1.26; k+ 3.7; na++ 141; ca 9.0 03-18-20: tsh 1.548 vit B 12: 529  TODAY  03-30-20: glucose 88; bun 26; creat 1.33; k+ 2.9; na++ 140 ca 9.0   Review of Systems  Constitutional: Negative for malaise/fatigue.  Respiratory: Positive for shortness of breath. Negative for cough.   Cardiovascular: Positive for leg swelling. Negative for chest pain and palpitations.  Gastrointestinal: Negative for abdominal pain,  constipation and heartburn.  Musculoskeletal: Negative for back pain, joint pain and myalgias.  Skin: Negative.   Neurological: Negative for dizziness.  Psychiatric/Behavioral: The patient is not nervous/anxious.     Physical Exam Constitutional:      General: She is not in acute distress.    Appearance: She is well-developed. She is obese. She is not diaphoretic.  Neck:     Thyroid: No thyromegaly.  Cardiovascular:     Rate and Rhythm: Normal rate. Rhythm irregular.     Heart sounds: Normal heart sounds.     Comments: Pedal pulses faint Pulmonary:     Effort: Pulmonary effort is normal. No respiratory distress.     Comments: Using 02 Breath sounds diminished bilaterally  Abdominal:     General: Bowel sounds are normal. There is no distension.     Palpations: Abdomen is soft.     Tenderness: There is no abdominal tenderness.  Musculoskeletal:     Cervical back: Neck supple.     Right lower leg: Edema present.     Left lower leg: Edema present.     Comments:  Is able to move all extremities Has significant lower extremity edema    Lymphadenopathy:     Cervical: No cervical adenopathy.  Skin:    General: Skin is warm and dry.     Comments: Bilateral lower extremities discolored     Neurological:     Mental Status: She is alert and oriented to person, place, and time.  Psychiatric:        Mood and Affect: Mood normal.        ASSESSMENT/ PLAN:  Today  1. Hypokalemia: is worse k+ 2.9; will increase to k+ 20 meq twice daily and will give k+ 20 meq three times today. Will repeat level in the AM.   MD is aware of resident's narcotic use and is in agreement with  current plan of care. We will attempt to wean resident as appropriate.  Ok Edwards NP [facility]  Contact [phone] Monday through Friday 8am- 5pm  After hours call [phone]

## 2020-04-01 DIAGNOSIS — Z961 Presence of intraocular lens: Secondary | ICD-10-CM | POA: Diagnosis not present

## 2020-04-01 DIAGNOSIS — H04123 Dry eye syndrome of bilateral lacrimal glands: Secondary | ICD-10-CM | POA: Diagnosis not present

## 2020-04-01 DIAGNOSIS — F411 Generalized anxiety disorder: Secondary | ICD-10-CM | POA: Diagnosis not present

## 2020-04-01 DIAGNOSIS — H524 Presbyopia: Secondary | ICD-10-CM | POA: Diagnosis not present

## 2020-04-01 DIAGNOSIS — H401134 Primary open-angle glaucoma, bilateral, indeterminate stage: Secondary | ICD-10-CM | POA: Diagnosis not present

## 2020-04-03 ENCOUNTER — Non-Acute Institutional Stay (SKILLED_NURSING_FACILITY): Payer: Medicare Other | Admitting: Adult Health

## 2020-04-03 ENCOUNTER — Encounter: Payer: Self-pay | Admitting: Adult Health

## 2020-04-03 DIAGNOSIS — K219 Gastro-esophageal reflux disease without esophagitis: Secondary | ICD-10-CM

## 2020-04-03 DIAGNOSIS — J9611 Chronic respiratory failure with hypoxia: Secondary | ICD-10-CM

## 2020-04-03 DIAGNOSIS — H40053 Ocular hypertension, bilateral: Secondary | ICD-10-CM

## 2020-04-03 DIAGNOSIS — Z9981 Dependence on supplemental oxygen: Secondary | ICD-10-CM | POA: Diagnosis not present

## 2020-04-03 NOTE — Progress Notes (Signed)
Location:    [place] Room Number: 122/D Place of Service:  SNF (31)   CODE STATUS: DNR  Allergies  Allergen Reactions   Bee Venom Anaphylaxis   Ace Inhibitors    Angiotensin Receptor Blockers     Tongue swelling.   Aspirin     Directed by physician   Cabbage Other (See Comments)    Certain foods due to gout flares   Latex Rash    Chief Complaint  Patient presents with   Medical Management of Chronic Issues          GERD without esophagitis:    Chronic respiratory failure with hypoxia on home 02 therapy:  Increased intraocular pressure bilateral     HPI:  She is a 86 year old long term resident of this facility being seen for the management of her chronic illnesses; GERD without esophagitis: Chronic respiratory failure with hypoxia on home 02 therapy: no changes in status will continue claritin 10 mg daily   Increased intraocular pressure bilateral .  There are no reports of uncontrolled pain. Her shortness of breath is better; she continues to have fatigue and is spending more time in bed per her choice.   Past Medical History:  Diagnosis Date   Anxiety    Cellulitis 2016   CHF (congestive heart failure) ([facility])    Chronic kidney disease    Dementia ([facility])    Dysphagia    Gait abnormality    GERD (gastroesophageal reflux disease)    Glaucoma    Gout, unspecified 01/12/2010   Qualifier: Diagnosis of  By: Moshe Cipro MD, Margaret     Hyperlipidemia    Hypertension    Muscle weakness    Myocardial infarction ([place]) 1990s   [place]   Osteoarthritis    Syncope     Past Surgical History:  Procedure Laterality Date   APPENDECTOMY     CATARACT EXTRACTION W/PHACO  08/04/2011   Procedure: CATARACT EXTRACTION PHACO AND INTRAOCULAR LENS PLACEMENT ([place]);  Surgeon: Tonny Branch;  Location: [facility];  Service: Ophthalmology;  Laterality: Right;  CDE=18.53   CATARACT EXTRACTION W/PHACO  08/25/2011   Procedure: CATARACT EXTRACTION PHACO  AND INTRAOCULAR LENS PLACEMENT (IOC);  Surgeon: Tonny Branch;  Location: [facility];  Service: Ophthalmology;  Laterality: Left;  CDE:14.76   TONSILLECTOMY      Social History   Socioeconomic History   Marital status: Divorced    Spouse name: Not on file   Number of children: Not on file   Years of education: Not on file   Highest education level: Not on file  Occupational History   Occupation: retired   Tobacco Use   Smoking status: Never Smoker   Smokeless tobacco: Never Used  Scientific laboratory technician Use: Never used  Substance and Sexual Activity   Alcohol use: No   Drug use: No   Sexual activity: Not Currently  Other Topics Concern   Not on file  Social History Narrative   Long term resident [facility]    Social Determinants of Health   Financial Resource Strain:    Difficulty of Paying Living Expenses:   Food Insecurity:    Worried About Charity fundraiser in the Last Year:    Arboriculturist in the Last Year:   Transportation Needs:    Film/video editor (Medical):    Lack of Transportation (Non-Medical):   Physical Activity:    Days of Exercise per Week:    Minutes  of Exercise per Session:   Stress:    Feeling of Stress :   Social Connections:    Frequency of Communication with Friends and Family:    Frequency of Social Gatherings with Friends and Family:    Attends Religious Services:    Active Member of Clubs or Organizations:    Attends Music therapist:    Marital Status:   Intimate Partner Violence:    Fear of Current or Ex-Partner:    Emotionally Abused:    Physically Abused:    Sexually Abused:    Family History  Problem Relation Age of Onset   Diabetes Father    Diabetes Sister    Diabetes Brother    Stroke Sister    Stroke Sister    Anesthesia problems Neg Hx    Hypotension Neg Hx    Malignant hyperthermia Neg Hx    Pseudochol deficiency Neg Hx       VITAL SIGNS BP (!) 142/70    Pulse 70     Temp (!) 97.2 F (36.2 C) (Oral)    Resp 20    Ht 4\' 11"  (1.499 m)    Wt 163 lb 6.4 oz (74.1 kg)    SpO2 95%    BMI 33.00 kg/m   Outpatient Encounter Medications as of 04/03/2020  Medication Sig   acetaminophen (TYLENOL) 500 MG tablet Take 1,000 mg by mouth every 8 (eight) hours as needed. For leg pain and arthritis. May have additional 1000 mg for break through pain once a day prn   alum & mag hydroxide-simeth (MAALOX PLUS) 400-400-40 MG/5ML suspension Amt: 4 teaspoons; oral Special Instructions: for indigestion/heartburn Twice A Day - PRN PRN 1, PRN 2   Alum & Mag Hydroxide-Simeth (MYLANTA PO) Take by mouth. liquid; regular; amt: 4 tsp.; oral Special Instructions: after breakfast Once A Day 09:00 AM   Balsam [place]-Castor Oil (VENELEX) OINT Apply topically to sacrum and bilateral buttocks every shift and as needed   Brinzolamide-Brimonidine (SIMBRINZA) 1-0.2 % SUSP One  drop into both eyes three times a day    Calcium Carbonate-Vitamin D (CALCIUM-VITAMIN D) 500-200 MG-UNIT tablet Take 1 tablet by mouth 2 (two) times daily.   citalopram (CELEXA) 10 MG tablet Take 30 mg by mouth daily.    cycloSPORINE (RESTASIS) 0.05 % ophthalmic emulsion Place 1 drop into both eyes 2 (two) times daily.    Dextromethorphan-guaiFENesin (ROBITUSSIN DM PO) 15 ml by mouth every 6 hours for cough/congestion. May give up to 48 hrs. Inform physician immediately if cough or congestion is associated with fever or SOB Every 6 Hours - PRN   docusate sodium (COLACE) 100 MG capsule Take 100 mg by mouth daily.    donepezil (ARICEPT) 5 MG tablet Take 5 mg by mouth daily. For dementia   furosemide (LASIX) 20 MG tablet Take 40 mg by mouth 2 (two) times daily.    hydrALAZINE (APRESOLINE) 50 MG tablet Take 50 mg by mouth 2 (two) times a day.    ipratropium-albuterol (DUONEB) 0.5-2.5 (3) MG/3ML SOLN Take 3 mLs by nebulization every 6 (six) hours as needed.   latanoprost (XALATAN) 0.005 % ophthalmic solution Place 1  drop into both eyes at bedtime.   loratadine (CLARITIN) 10 MG tablet Take 10 mg by mouth daily.   LORazepam (ATIVAN) 0.5 MG tablet Take 0.5 tablets (0.25 mg total) by mouth daily.   nitroGLYCERIN (NITROSTAT) 0.4 MG SL tablet Place 0.4 mg under the tongue every 5 (five) minutes as needed for chest pain.  NON FORMULARY Diet Type:  Regular, NAS   OXYGEN Inhale 2 L/min into the lungs continuous. Wean to keep sats above 90%   pantoprazole (PROTONIX) 40 MG tablet Take 40 mg by mouth daily. Do not crush.    potassium chloride (K-DUR,KLOR-CON) 10 MEQ tablet Take 20 mEq by mouth 2 (two) times daily.    No facility-administered encounter medications on file as of 04/03/2020.     SIGNIFICANT DIAGNOSTIC EXAMS   PREVIOUS ;   07-22-19: right forearm x-ray  1. Impacted fracture of the radial neck junction. 2. Suspect additional nondisplaced fracture of the coronoid process. 3. Right oval swelling and trace joint effusion. 4. Degenerative arthrosis of the wrist and elbow. Enthesopathic changes of the olecranon.  07-22-19: right shoulder x-ray  1. The osseous structures appear diffusely demineralized which may limit detection of small or nondisplaced fractures. 2. No acute fracture or traumatic malalignment. 3. High-riding appearance of the humeral head likely reflecting rotator cuff insufficiency. 4. Moderate osteoarthrosis of the acromioclavicular and glenohumeral joints.  03-21-20: chest x-ray: Mild congestive heart failure   NO NEW EXAMS.   LABS REVIEWED PREVIOUS:   03-11-19: wbc 4.7; hgb 11.4; hct 36.7; mcv 83.4; plt 83.4; plt 211; glucose 87; bun 44; creat 1.32; k+ 3.8; na++ 139; ca 9.4; liver normal albumin 4.0  09-06-19: wbc 4.4; hgb 10.8; hct 34.9; mcv 84.7 plt 261; glucose 81; bun 26; creat 1.21; k+ 3.5; na++ 141; ca 9.1 liver normal albumin 3.5    02-24-20: wbc 4.9; hgb 9.5; hct 30.0 mcv 82.2 plt 160; glucose 82; bun 49; creat 1.54; k+ 3.4; na++ 140; ca 8.7 liver normal albumin  3.4 02-27-20: glucose 84; bun 41; creat 1.29; k+ 3.9; na++ 142; ca 9.0  03-12-20: glucose 87; bun 45; creat 1.26; k+ 3.7; na++ 141; ca 9.0 03-18-20: tsh 1.548 vit B 12: 529 03-30-20: glucose 88; bun 26; creat 1.33; k+ 2.9; na++ 140 ca 9.0  03-31-20: k+ 3.6  NO NEW LABS.   Review of Systems  Constitutional: Positive for malaise/fatigue.  Respiratory: Negative for cough and shortness of breath.   Cardiovascular: Positive for leg swelling. Negative for chest pain and palpitations.  Gastrointestinal: Negative for abdominal pain, constipation and heartburn.  Musculoskeletal: Negative for back pain, joint pain and myalgias.  Skin: Negative.   Neurological: Negative for dizziness.  Psychiatric/Behavioral: The patient is not nervous/anxious.     Physical Exam Constitutional:      General: She is not in acute distress.    Appearance: She is well-developed. She is obese. She is not diaphoretic.  Neck:     Thyroid: No thyromegaly.  Cardiovascular:     Rate and Rhythm: Normal rate. Rhythm irregular.     Heart sounds: Normal heart sounds.     Comments:  Pedal pulses faint Pulmonary:     Effort: Pulmonary effort is normal. No respiratory distress.     Comments: Using 02 Breath sounds diminished bilaterally  Abdominal:     General: Bowel sounds are normal. There is no distension.     Palpations: Abdomen is soft.     Tenderness: There is no abdominal tenderness.  Musculoskeletal:     Cervical back: Neck supple.     Right lower leg: Edema present.     Left lower leg: Edema present.     Comments: Is able to move all extremities Has significant lower extremity edema     Lymphadenopathy:     Cervical: No cervical adenopathy.  Skin:    General: Skin is  warm and dry.     Comments: Bilateral lower extremities discolored  Neurological:     Mental Status: She is alert and oriented to person, place, and time.  Psychiatric:        Mood and Affect: Mood normal.       ASSESSMENT/ PLAN:  TODAY:    1. GERD without esophagitis: is stable will continue protonix 40 mg daily   2. Chronic respiratory failure with hypoxia on home 02 therapy: no changes in status will continue claritin 10 mg daily   3. Increased intraocular pressure bilateral is stable will continue simbrinza three times daily and travatan nightly to both eyes.    PREVIOUS  4. Hypokalemia is stable will continue k+ 10 meq daily   5. Vascular dementia with behavioral disturbance: no significant change in status: weight is 163 pounds albumin 3.9 will continue aricept 5 mg daily her weight is stable at this time.   6. Primary osteoarthritis multiple joints: is stable will continue tylenol 1 gm twice daily   7. CKD (chronic kidney disease) stage 3 GFR 30-59 ml/min is stable bun 27; creat 1.09   8. Chronic constipation: is stable will continue colace twice daily   9. Depression with anxiety: is stable will continue celexa 30 mg daily ativan 0.25 mg daily has failed one wean from celexa   10. Chronic diastolic CHF (congestive heart failure) EF 65-70% (03-04-18) is without change  will continue apresoline 50 mg twice daily lasix 20 mg daily and has prn ntg   11. Deep vein thrombosis (DVT) of left lower extremity unspecified chronicity unspecified vein: is stable will continue to monitor her status.   12. Chronic non-seasonal allergic rhinitis: is stable will continue claritin 10 mg daily   13. Hypertensive heart and kidney disease with chronic diastolic congestive heart failure and stage 3 chronic kidney disease: is stable b/p 142/70 will continue apresoline 50 mg twice daily and lasix 20 mg daily    MD is aware of resident's narcotic use and is in agreement with current plan of care. We will attempt to wean resident as appropriate.  Ok Edwards NP [facility]  Contact [phone] Monday through Friday 8am- 5pm  After hours call [phone]

## 2020-04-06 ENCOUNTER — Other Ambulatory Visit: Payer: Self-pay | Admitting: Adult Health

## 2020-04-06 MED ORDER — LORAZEPAM 0.5 MG PO TABS: 0.2500 mg | ORAL_TABLET | Freq: Every day | ORAL | 0 refills | Status: DC

## 2020-04-15 DIAGNOSIS — F411 Generalized anxiety disorder: Secondary | ICD-10-CM | POA: Diagnosis not present

## 2020-04-16 ENCOUNTER — Non-Acute Institutional Stay (SKILLED_NURSING_FACILITY): Payer: Medicare Other | Admitting: Adult Health

## 2020-04-16 ENCOUNTER — Encounter: Payer: Self-pay | Admitting: Adult Health

## 2020-04-16 DIAGNOSIS — F0281 Dementia in other diseases classified elsewhere with behavioral disturbance: Secondary | ICD-10-CM

## 2020-04-16 DIAGNOSIS — F0390 Unspecified dementia without behavioral disturbance: Secondary | ICD-10-CM | POA: Diagnosis not present

## 2020-04-16 DIAGNOSIS — G301 Alzheimer's disease with late onset: Secondary | ICD-10-CM | POA: Diagnosis not present

## 2020-04-16 DIAGNOSIS — F02818 Dementia in other diseases classified elsewhere, unspecified severity, with other behavioral disturbance: Secondary | ICD-10-CM

## 2020-04-16 DIAGNOSIS — Z1159 Encounter for screening for other viral diseases: Secondary | ICD-10-CM | POA: Diagnosis not present

## 2020-04-16 DIAGNOSIS — Z741 Need for assistance with personal care: Secondary | ICD-10-CM | POA: Diagnosis not present

## 2020-04-16 DIAGNOSIS — Z993 Dependence on wheelchair: Secondary | ICD-10-CM | POA: Diagnosis not present

## 2020-04-16 DIAGNOSIS — I5032 Chronic diastolic (congestive) heart failure: Secondary | ICD-10-CM | POA: Diagnosis not present

## 2020-04-16 DIAGNOSIS — M79601 Pain in right arm: Secondary | ICD-10-CM | POA: Diagnosis not present

## 2020-04-16 DIAGNOSIS — M6281 Muscle weakness (generalized): Secondary | ICD-10-CM | POA: Diagnosis not present

## 2020-04-16 DIAGNOSIS — Z9981 Dependence on supplemental oxygen: Secondary | ICD-10-CM

## 2020-04-16 DIAGNOSIS — J9611 Chronic respiratory failure with hypoxia: Secondary | ICD-10-CM | POA: Diagnosis not present

## 2020-04-16 DIAGNOSIS — R488 Other symbolic dysfunctions: Secondary | ICD-10-CM | POA: Diagnosis not present

## 2020-04-16 DIAGNOSIS — I130 Hypertensive heart and chronic kidney disease with heart failure and stage 1 through stage 4 chronic kidney disease, or unspecified chronic kidney disease: Secondary | ICD-10-CM | POA: Diagnosis not present

## 2020-04-16 DIAGNOSIS — R1312 Dysphagia, oropharyngeal phase: Secondary | ICD-10-CM | POA: Diagnosis not present

## 2020-04-16 NOTE — Progress Notes (Signed)
Location:    [place] Room Number: 122/D Place of Service:  SNF (31)   CODE STATUS: DNR  Allergies  Allergen Reactions  . Bee Venom Anaphylaxis  . Ace Inhibitors   . Angiotensin Receptor Blockers     Tongue swelling.  . Aspirin     Directed by physician  . Cabbage Other (See Comments)    Certain foods due to gout flares  . Latex Rash    Chief Complaint  Patient presents with  . Acute Visit    Care Plan Meeting    HPI:  We have come together for her care plan meeting. Family present. BIMS 13/15 mood 3/30. She has not had falls. She has lost nearly 40 pounds in water weight now at 125 pounds. She does not have any lower extremity edema. She will be working with therapy for strengthening and to start getting out of bed daily. She wants to keep her legs elevated. She is wearing her 02 on a more consistent basis. We have discussed her advanced directives; she is a DNR. We have discussed hospitalizations; ivf and abt. We did discuss tube feeding; her family would like to discuss this therapy before making any decisions. Her daughter tells me that she is hallucinating; they are not causing her any emotional disturbance at this time. She will continue to be followed for her chronic illnesses including: Late onset alzheimer's disease with behavioral disturbance  Chronic diastolic CHF (Congestive heart failure)  Chronic respiratory failure with hypoxia on home 02.   Past Medical History:  Diagnosis Date  . Anxiety   . Cellulitis 2016  . CHF (congestive heart failure) ([place])   . Chronic kidney disease   . Dementia ([place])   . Dysphagia   . Gait abnormality   . GERD (gastroesophageal reflux disease)   . Glaucoma   . Gout, unspecified 01/12/2010   Qualifier: Diagnosis of  By: Moshe Cipro MD, Joycelyn Schmid    . Hyperlipidemia   . Hypertension   . Muscle weakness   . Myocardial infarction [facility]) [place]  . Osteoarthritis   . Syncope     Past Surgical History:   Procedure Laterality Date  . APPENDECTOMY    . CATARACT EXTRACTION W/PHACO  08/04/2011   Procedure: CATARACT EXTRACTION PHACO AND INTRAOCULAR LENS PLACEMENT (IOC);  Surgeon: Tonny Branch;  Location: [facility];  Service: Ophthalmology;  Laterality: Right;  CDE=18.53  . CATARACT EXTRACTION W/PHACO  08/25/2011   Procedure: CATARACT EXTRACTION PHACO AND INTRAOCULAR LENS PLACEMENT (IOC);  Surgeon: Tonny Branch;  Location: [facility];  Service: Ophthalmology;  Laterality: Left;  CDE:14.76  . TONSILLECTOMY      Social History   Socioeconomic History  . Marital status: Divorced    Spouse name: Not on file  . Number of children: Not on file  . Years of education: Not on file  . Highest education level: Not on file  Occupational History  . Occupation: retired   Tobacco Use  . Smoking status: Never Smoker  . Smokeless tobacco: Never Used  Vaping Use  . Vaping Use: Never used  Substance and Sexual Activity  . Alcohol use: No  . Drug use: No  . Sexual activity: Not Currently  Other Topics Concern  . Not on file  Social History Narrative   Long term resident [facility]    Social Determinants of Health   Financial Resource Strain:   . Difficulty of Paying Living Expenses: Not on file  Food Insecurity:   .  Worried About Charity fundraiser in the Last Year: Not on file  . Ran Out of Food in the Last Year: Not on file  Transportation Needs:   . Lack of Transportation (Medical): Not on file  . Lack of Transportation (Non-Medical): Not on file  Physical Activity:   . Days of Exercise per Week: Not on file  . Minutes of Exercise per Session: Not on file  Stress:   . Feeling of Stress : Not on file  Social Connections:   . Frequency of Communication with Friends and Family: Not on file  . Frequency of Social Gatherings with Friends and Family: Not on file  . Attends Religious Services: Not on file  . Active Member of Clubs or Organizations: Not on file  . Attends Archivist Meetings: Not on  file  . Marital Status: Not on file  Intimate Partner Violence:   . Fear of Current or Ex-Partner: Not on file  . Emotionally Abused: Not on file  . Physically Abused: Not on file  . Sexually Abused: Not on file   Family History  Problem Relation Age of Onset  . Diabetes Father   . Diabetes Sister   . Diabetes Brother   . Stroke Sister   . Stroke Sister   . Anesthesia problems Neg Hx   . Hypotension Neg Hx   . Malignant hyperthermia Neg Hx   . Pseudochol deficiency Neg Hx       VITAL SIGNS BP 129/64   Pulse 63   Temp 98.1 F (36.7 C) (Oral)   Resp 16   Ht 4\' 11"  (1.499 m)   Wt 125 lb 12.8 oz (57.1 kg)   SpO2 98%   BMI 25.41 kg/m   Outpatient Encounter Medications as of 04/16/2020  Medication Sig  . acetaminophen (TYLENOL) 500 MG tablet Take 1,000 mg by mouth every 8 (eight) hours as needed. For leg pain and arthritis. May have additional 1000 mg for break through pain once a day prn  . alum & mag hydroxide-simeth (MAALOX PLUS) 400-400-40 MG/5ML suspension Amt: 4 teaspoons; oral Special Instructions: for indigestion/heartburn Twice A Day - PRN PRN 1, PRN 2  . Alum & Mag Hydroxide-Simeth (MYLANTA PO) Take by mouth. liquid; regular; amt: 4 tsp.; oral Special Instructions: after breakfast Once A Day 09:00 AM  . Balsam [place]-Castor Oil (VENELEX) OINT Apply topically to sacrum and bilateral buttocks every shift and as needed  . Brinzolamide-Brimonidine (SIMBRINZA) 1-0.2 % SUSP One  drop into both eyes three times a day   . Calcium Carbonate-Vitamin D (CALCIUM-VITAMIN D) 500-200 MG-UNIT tablet Take 1 tablet by mouth 2 (two) times daily.  . citalopram (CELEXA) 10 MG tablet Take 30 mg by mouth daily.   . cycloSPORINE (RESTASIS) 0.05 % ophthalmic emulsion Place 1 drop into both eyes 2 (two) times daily.   Marland Kitchen Dextromethorphan-guaiFENesin (ROBITUSSIN DM PO) 15 ml by mouth every 6 hours for cough/congestion. May give up to 48 hrs. Inform physician immediately if cough or congestion  is associated with fever or SOB Every 6 Hours - PRN  . docusate sodium (COLACE) 100 MG capsule Take 100 mg by mouth daily.   Marland Kitchen donepezil (ARICEPT) 5 MG tablet Take 5 mg by mouth daily. For dementia  . furosemide (LASIX) 20 MG tablet Take 40 mg by mouth 2 (two) times daily.   . hydrALAZINE (APRESOLINE) 50 MG tablet Take 50 mg by mouth 2 (two) times a day.   . ipratropium-albuterol (DUONEB) 0.5-2.5 (3) MG/3ML  SOLN Take 3 mLs by nebulization every 6 (six) hours as needed.  . latanoprost (XALATAN) 0.005 % ophthalmic solution Place 1 drop into both eyes at bedtime.  Marland Kitchen loratadine (CLARITIN) 10 MG tablet Take 10 mg by mouth daily.  Marland Kitchen LORazepam (ATIVAN) 0.5 MG tablet Take 0.5 tablets (0.25 mg total) by mouth daily.  . nitroGLYCERIN (NITROSTAT) 0.4 MG SL tablet Place 0.4 mg under the tongue every 5 (five) minutes as needed for chest pain.   . NON FORMULARY Diet Type:  Regular, NAS  . OXYGEN Inhale 2 L/min into the lungs continuous. Wean to keep sats above 90%  . pantoprazole (PROTONIX) 40 MG tablet Take 40 mg by mouth daily. Do not crush.   . potassium chloride (K-DUR,KLOR-CON) 10 MEQ tablet Take 20 mEq by mouth 2 (two) times daily.    No facility-administered encounter medications on file as of 04/16/2020.     SIGNIFICANT DIAGNOSTIC EXAMS   PREVIOUS ;   07-22-19: right forearm x-ray  1. Impacted fracture of the radial neck junction. 2. Suspect additional nondisplaced fracture of the coronoid process. 3. Right oval swelling and trace joint effusion. 4. Degenerative arthrosis of the wrist and elbow. Enthesopathic changes of the olecranon.  07-22-19: right shoulder x-ray  1. The osseous structures appear diffusely demineralized which may limit detection of small or nondisplaced fractures. 2. No acute fracture or traumatic malalignment. 3. High-riding appearance of the humeral head likely reflecting rotator cuff insufficiency. 4. Moderate osteoarthrosis of the acromioclavicular and  glenohumeral joints.  03-21-20: chest x-ray: Mild congestive heart failure   NO NEW EXAMS.   LABS REVIEWED PREVIOUS:   09-06-19: wbc 4.4; hgb 10.8; hct 34.9; mcv 84.7 plt 261; glucose 81; bun 26; creat 1.21; k+ 3.5; na++ 141; ca 9.1 liver normal albumin 3.5    02-24-20: wbc 4.9; hgb 9.5; hct 30.0 mcv 82.2 plt 160; glucose 82; bun 49; creat 1.54; k+ 3.4; na++ 140; ca 8.7 liver normal albumin 3.4 02-27-20: glucose 84; bun 41; creat 1.29; k+ 3.9; na++ 142; ca 9.0  03-12-20: glucose 87; bun 45; creat 1.26; k+ 3.7; na++ 141; ca 9.0 03-18-20: tsh 1.548 vit B 12: 529 03-30-20: glucose 88; bun 26; creat 1.33; k+ 2.9; na++ 140 ca 9.0  03-31-20: k+ 3.6  NO NEW LABS.   Review of Systems  Constitutional: Negative for malaise/fatigue.  Respiratory: Negative for cough and shortness of breath.   Cardiovascular: Negative for chest pain, palpitations and leg swelling.  Gastrointestinal: Negative for abdominal pain, constipation and heartburn.  Musculoskeletal: Negative for back pain, joint pain and myalgias.  Skin: Negative.   Neurological: Negative for dizziness.  Psychiatric/Behavioral: The patient is not nervous/anxious.     Physical Exam Constitutional:      General: She is not in acute distress.    Appearance: She is well-developed. She is not diaphoretic.  Neck:     Thyroid: No thyromegaly.  Cardiovascular:     Rate and Rhythm: Normal rate and regular rhythm.     Pulses: Normal pulses.     Heart sounds: Normal heart sounds.  Pulmonary:     Effort: Pulmonary effort is normal. No respiratory distress.     Breath sounds: Normal breath sounds.     Comments: 02 Abdominal:     General: Bowel sounds are normal. There is no distension.     Palpations: Abdomen is soft.     Tenderness: There is no abdominal tenderness.  Musculoskeletal:        General: Normal range of motion.  Right lower leg: No edema.     Left lower leg: No edema.  Lymphadenopathy:     Cervical: No cervical adenopathy.    Skin:    General: Skin is warm and dry.     Comments: Bilateral lower extremities discolored   Neurological:     Mental Status: She is alert. Mental status is at baseline.  Psychiatric:        Mood and Affect: Mood normal.       ASSESSMENT/ PLAN:  TODAY  1. Late onset alzheimer's disease with behavioral disturbance 2. Chronic diastolic CHF (Congestive heart failure) 3. Chronic respiratory failure with hypoxia on home 02.   Will setup ST for cognitive assessment With have therapy see for strengthening and wheelchair use Will continue to monitor her hallucinations  Her daughter to consider peg tube feeding.  Will continue to monitor her status.   Time spent with patient 30 minutes (25 minutes with advanced directives). MOST FORM: DNR limited hospitalization; ok for abt and ivf; will make decision about tube feeding in the near future.     MD is aware of resident's narcotic use and is in agreement with current plan of care. We will attempt to wean resident as appropriate.  Ok Edwards NP [facility]  Contact [phone] Monday through Friday 8am- 5pm  After hours call [phone]

## 2020-04-17 DIAGNOSIS — F0390 Unspecified dementia without behavioral disturbance: Secondary | ICD-10-CM | POA: Diagnosis not present

## 2020-04-17 DIAGNOSIS — Z741 Need for assistance with personal care: Secondary | ICD-10-CM | POA: Diagnosis not present

## 2020-04-17 DIAGNOSIS — R1312 Dysphagia, oropharyngeal phase: Secondary | ICD-10-CM | POA: Diagnosis not present

## 2020-04-17 DIAGNOSIS — I130 Hypertensive heart and chronic kidney disease with heart failure and stage 1 through stage 4 chronic kidney disease, or unspecified chronic kidney disease: Secondary | ICD-10-CM | POA: Diagnosis not present

## 2020-04-17 DIAGNOSIS — M6281 Muscle weakness (generalized): Secondary | ICD-10-CM | POA: Diagnosis not present

## 2020-04-17 DIAGNOSIS — R488 Other symbolic dysfunctions: Secondary | ICD-10-CM | POA: Diagnosis not present

## 2020-04-20 DIAGNOSIS — I130 Hypertensive heart and chronic kidney disease with heart failure and stage 1 through stage 4 chronic kidney disease, or unspecified chronic kidney disease: Secondary | ICD-10-CM | POA: Diagnosis not present

## 2020-04-20 DIAGNOSIS — R1312 Dysphagia, oropharyngeal phase: Secondary | ICD-10-CM | POA: Diagnosis not present

## 2020-04-20 DIAGNOSIS — F0390 Unspecified dementia without behavioral disturbance: Secondary | ICD-10-CM | POA: Diagnosis not present

## 2020-04-20 DIAGNOSIS — Z741 Need for assistance with personal care: Secondary | ICD-10-CM | POA: Diagnosis not present

## 2020-04-20 DIAGNOSIS — R488 Other symbolic dysfunctions: Secondary | ICD-10-CM | POA: Diagnosis not present

## 2020-04-20 DIAGNOSIS — M6281 Muscle weakness (generalized): Secondary | ICD-10-CM | POA: Diagnosis not present

## 2020-04-21 DIAGNOSIS — Z741 Need for assistance with personal care: Secondary | ICD-10-CM | POA: Diagnosis not present

## 2020-04-21 DIAGNOSIS — M79601 Pain in right arm: Secondary | ICD-10-CM | POA: Diagnosis not present

## 2020-04-21 DIAGNOSIS — F0390 Unspecified dementia without behavioral disturbance: Secondary | ICD-10-CM | POA: Diagnosis not present

## 2020-04-21 DIAGNOSIS — R488 Other symbolic dysfunctions: Secondary | ICD-10-CM | POA: Diagnosis not present

## 2020-04-21 DIAGNOSIS — I130 Hypertensive heart and chronic kidney disease with heart failure and stage 1 through stage 4 chronic kidney disease, or unspecified chronic kidney disease: Secondary | ICD-10-CM | POA: Diagnosis not present

## 2020-04-21 DIAGNOSIS — R1312 Dysphagia, oropharyngeal phase: Secondary | ICD-10-CM | POA: Diagnosis not present

## 2020-04-21 DIAGNOSIS — M6281 Muscle weakness (generalized): Secondary | ICD-10-CM | POA: Diagnosis not present

## 2020-04-21 DIAGNOSIS — Z1159 Encounter for screening for other viral diseases: Secondary | ICD-10-CM | POA: Diagnosis not present

## 2020-04-22 DIAGNOSIS — I130 Hypertensive heart and chronic kidney disease with heart failure and stage 1 through stage 4 chronic kidney disease, or unspecified chronic kidney disease: Secondary | ICD-10-CM | POA: Diagnosis not present

## 2020-04-22 DIAGNOSIS — F0390 Unspecified dementia without behavioral disturbance: Secondary | ICD-10-CM | POA: Diagnosis not present

## 2020-04-22 DIAGNOSIS — M6281 Muscle weakness (generalized): Secondary | ICD-10-CM | POA: Diagnosis not present

## 2020-04-22 DIAGNOSIS — Z741 Need for assistance with personal care: Secondary | ICD-10-CM | POA: Diagnosis not present

## 2020-04-22 DIAGNOSIS — R1312 Dysphagia, oropharyngeal phase: Secondary | ICD-10-CM | POA: Diagnosis not present

## 2020-04-22 DIAGNOSIS — F411 Generalized anxiety disorder: Secondary | ICD-10-CM | POA: Diagnosis not present

## 2020-04-22 DIAGNOSIS — R488 Other symbolic dysfunctions: Secondary | ICD-10-CM | POA: Diagnosis not present

## 2020-04-23 DIAGNOSIS — R488 Other symbolic dysfunctions: Secondary | ICD-10-CM | POA: Diagnosis not present

## 2020-04-23 DIAGNOSIS — M6281 Muscle weakness (generalized): Secondary | ICD-10-CM | POA: Diagnosis not present

## 2020-04-23 DIAGNOSIS — Z741 Need for assistance with personal care: Secondary | ICD-10-CM | POA: Diagnosis not present

## 2020-04-23 DIAGNOSIS — R1312 Dysphagia, oropharyngeal phase: Secondary | ICD-10-CM | POA: Diagnosis not present

## 2020-04-23 DIAGNOSIS — I130 Hypertensive heart and chronic kidney disease with heart failure and stage 1 through stage 4 chronic kidney disease, or unspecified chronic kidney disease: Secondary | ICD-10-CM | POA: Diagnosis not present

## 2020-04-23 DIAGNOSIS — F0390 Unspecified dementia without behavioral disturbance: Secondary | ICD-10-CM | POA: Diagnosis not present

## 2020-04-24 ENCOUNTER — Encounter: Payer: Self-pay | Admitting: Adult Health

## 2020-04-24 ENCOUNTER — Non-Acute Institutional Stay: Payer: Self-pay | Admitting: Adult Health

## 2020-04-24 DIAGNOSIS — F418 Other specified anxiety disorders: Secondary | ICD-10-CM

## 2020-04-24 DIAGNOSIS — M6281 Muscle weakness (generalized): Secondary | ICD-10-CM | POA: Diagnosis not present

## 2020-04-24 DIAGNOSIS — G301 Alzheimer's disease with late onset: Secondary | ICD-10-CM

## 2020-04-24 DIAGNOSIS — F02818 Dementia in other diseases classified elsewhere, unspecified severity, with other behavioral disturbance: Secondary | ICD-10-CM

## 2020-04-24 DIAGNOSIS — R1312 Dysphagia, oropharyngeal phase: Secondary | ICD-10-CM | POA: Diagnosis not present

## 2020-04-24 DIAGNOSIS — I130 Hypertensive heart and chronic kidney disease with heart failure and stage 1 through stage 4 chronic kidney disease, or unspecified chronic kidney disease: Secondary | ICD-10-CM | POA: Diagnosis not present

## 2020-04-24 DIAGNOSIS — Z741 Need for assistance with personal care: Secondary | ICD-10-CM | POA: Diagnosis not present

## 2020-04-24 DIAGNOSIS — R488 Other symbolic dysfunctions: Secondary | ICD-10-CM | POA: Diagnosis not present

## 2020-04-24 DIAGNOSIS — F0281 Dementia in other diseases classified elsewhere with behavioral disturbance: Secondary | ICD-10-CM | POA: Diagnosis not present

## 2020-04-24 DIAGNOSIS — F0390 Unspecified dementia without behavioral disturbance: Secondary | ICD-10-CM | POA: Diagnosis not present

## 2020-04-27 DIAGNOSIS — Z1159 Encounter for screening for other viral diseases: Secondary | ICD-10-CM | POA: Diagnosis not present

## 2020-04-27 DIAGNOSIS — F0390 Unspecified dementia without behavioral disturbance: Secondary | ICD-10-CM | POA: Diagnosis not present

## 2020-04-27 DIAGNOSIS — R1312 Dysphagia, oropharyngeal phase: Secondary | ICD-10-CM | POA: Diagnosis not present

## 2020-04-27 DIAGNOSIS — R488 Other symbolic dysfunctions: Secondary | ICD-10-CM | POA: Diagnosis not present

## 2020-04-27 DIAGNOSIS — I130 Hypertensive heart and chronic kidney disease with heart failure and stage 1 through stage 4 chronic kidney disease, or unspecified chronic kidney disease: Secondary | ICD-10-CM | POA: Diagnosis not present

## 2020-04-27 DIAGNOSIS — Z741 Need for assistance with personal care: Secondary | ICD-10-CM | POA: Diagnosis not present

## 2020-04-27 DIAGNOSIS — M6281 Muscle weakness (generalized): Secondary | ICD-10-CM | POA: Diagnosis not present

## 2020-04-27 DIAGNOSIS — M79601 Pain in right arm: Secondary | ICD-10-CM | POA: Diagnosis not present

## 2020-04-27 NOTE — Progress Notes (Signed)
Location:   [facility] Room Number: 122/D Place of Service:  SNF (31)   CODE STATUS: dnr  Allergies  Allergen Reactions  . Bee Venom Anaphylaxis  . Ace Inhibitors   . Angiotensin Receptor Blockers     Tongue swelling.  . Aspirin     Directed by physician  . Cabbage Other (See Comments)    Certain foods due to gout flares  . Latex Rash    Chief Complaint  Patient presents with  . Acute Visit    medication review     HPI:  She is presently taking celexa 30 mg daily; ativan 0.25 mg daily. She has failed wean of celexa one time. There are no reports of anxious or depressive thoughts. No reports of uncontrolled pain. She is tolerating her medications.   Past Medical History:  Diagnosis Date  . Anxiety   . Cellulitis 2016  . CHF (congestive heart failure) ([place])   . Chronic kidney disease   . Dementia ([place])   . Dysphagia   . Gait abnormality   . GERD (gastroesophageal reflux disease)   . Glaucoma   . Gout, unspecified 01/12/2010   Qualifier: Diagnosis of  By: Moshe Cipro MD, Joycelyn Schmid    . Hyperlipidemia   . Hypertension   . Muscle weakness   . Myocardial infarction [facility]) [place]  . Osteoarthritis   . Syncope     Past Surgical History:  Procedure Laterality Date  . APPENDECTOMY    . CATARACT EXTRACTION W/PHACO  08/04/2011   Procedure: CATARACT EXTRACTION PHACO AND INTRAOCULAR LENS PLACEMENT (IOC);  Surgeon: Tonny Branch;  Location: [facility];  Service: Ophthalmology;  Laterality: Right;  CDE=18.53  . CATARACT EXTRACTION W/PHACO  08/25/2011   Procedure: CATARACT EXTRACTION PHACO AND INTRAOCULAR LENS PLACEMENT (IOC);  Surgeon: Tonny Branch;  Location: [facility];  Service: Ophthalmology;  Laterality: Left;  CDE:14.76  . TONSILLECTOMY      Social History   Socioeconomic History  . Marital status: Divorced    Spouse name: Not on file  . Number of children: Not on file  . Years of education: Not on file  . Highest education level: Not on file  Occupational  History  . Occupation: retired   Tobacco Use  . Smoking status: Never Smoker  . Smokeless tobacco: Never Used  Vaping Use  . Vaping Use: Never used  Substance and Sexual Activity  . Alcohol use: No  . Drug use: No  . Sexual activity: Not Currently  Other Topics Concern  . Not on file  Social History Narrative   Long term resident [facility]    Social Determinants of Health   Financial Resource Strain:   . Difficulty of Paying Living Expenses: Not on file  Food Insecurity:   . Worried About Charity fundraiser in the Last Year: Not on file  . Ran Out of Food in the Last Year: Not on file  Transportation Needs:   . Lack of Transportation (Medical): Not on file  . Lack of Transportation (Non-Medical): Not on file  Physical Activity:   . Days of Exercise per Week: Not on file  . Minutes of Exercise per Session: Not on file  Stress:   . Feeling of Stress : Not on file  Social Connections:   . Frequency of Communication with Friends and Family: Not on file  . Frequency of Social Gatherings with Friends and Family: Not on file  . Attends Religious Services: Not on file  .  Active Member of Clubs or Organizations: Not on file  . Attends Archivist Meetings: Not on file  . Marital Status: Not on file  Intimate Partner Violence:   . Fear of Current or Ex-Partner: Not on file  . Emotionally Abused: Not on file  . Physically Abused: Not on file  . Sexually Abused: Not on file   Family History  Problem Relation Age of Onset  . Diabetes Father   . Diabetes Sister   . Diabetes Brother   . Stroke Sister   . Stroke Sister   . Anesthesia problems Neg Hx   . Hypotension Neg Hx   . Malignant hyperthermia Neg Hx   . Pseudochol deficiency Neg Hx       VITAL SIGNS BP 138/72   Pulse 65   Temp 98.6 F (37 C) (Oral)   Resp 16   Ht 4\' 11"  (1.499 m)   Wt 125 lb 12.8 oz (57.1 kg)   SpO2 96%   BMI 25.41 kg/m   Outpatient Encounter Medications as of 04/24/2020  Medication  Sig  . acetaminophen (TYLENOL) 500 MG tablet Take 1,000 mg by mouth every 8 (eight) hours as needed. For leg pain and arthritis. May have additional 1000 mg for break through pain once a day prn  . alum & mag hydroxide-simeth (MAALOX PLUS) 400-400-40 MG/5ML suspension Amt: 4 teaspoons; oral Special Instructions: for indigestion/heartburn Twice A Day - PRN PRN 1, PRN 2  . Alum & Mag Hydroxide-Simeth (MYLANTA PO) Take by mouth. liquid; regular; amt: 4 tsp.; oral Special Instructions: after breakfast Once A Day 09:00 AM  . Balsam [place]-Castor Oil (VENELEX) OINT Apply topically to sacrum and bilateral buttocks every shift and as needed  . Brinzolamide-Brimonidine (SIMBRINZA) 1-0.2 % SUSP One  drop into both eyes three times a day   . Calcium Carbonate-Vitamin D (CALCIUM-VITAMIN D) 500-200 MG-UNIT tablet Take 1 tablet by mouth 2 (two) times daily.  . citalopram (CELEXA) 10 MG tablet Take 30 mg by mouth daily.   . cycloSPORINE (RESTASIS) 0.05 % ophthalmic emulsion Place 1 drop into both eyes 2 (two) times daily.   Marland Kitchen Dextromethorphan-guaiFENesin (ROBITUSSIN DM PO) 15 ml by mouth every 6 hours for cough/congestion. May give up to 48 hrs. Inform physician immediately if cough or congestion is associated with fever or SOB Every 6 Hours - PRN  . docusate sodium (COLACE) 100 MG capsule Take 100 mg by mouth daily.   Marland Kitchen donepezil (ARICEPT) 5 MG tablet Take 5 mg by mouth daily. For dementia  . furosemide (LASIX) 20 MG tablet Take 40 mg by mouth 2 (two) times daily.   . hydrALAZINE (APRESOLINE) 50 MG tablet Take 50 mg by mouth 2 (two) times a day.   . ipratropium-albuterol (DUONEB) 0.5-2.5 (3) MG/3ML SOLN Take 3 mLs by nebulization every 6 (six) hours as needed.  . latanoprost (XALATAN) 0.005 % ophthalmic solution Place 1 drop into both eyes at bedtime.  Marland Kitchen loratadine (CLARITIN) 10 MG tablet Take 10 mg by mouth daily.  Marland Kitchen LORazepam (ATIVAN) 0.5 MG tablet Take 0.5 tablets (0.25 mg total) by mouth daily.  .  nitroGLYCERIN (NITROSTAT) 0.4 MG SL tablet Place 0.4 mg under the tongue every 5 (five) minutes as needed for chest pain.   . NON FORMULARY Diet Type:  Regular, NAS  . OXYGEN Inhale 2 L/min into the lungs continuous. Wean to keep sats above 90%  . pantoprazole (PROTONIX) 40 MG tablet Take 40 mg by mouth daily. Do not crush.   Marland Kitchen  potassium chloride (K-DUR,KLOR-CON) 10 MEQ tablet Take 20 mEq by mouth 2 (two) times daily.    No facility-administered encounter medications on file as of 04/24/2020.     SIGNIFICANT DIAGNOSTIC EXAMS   PREVIOUS ;   07-22-19: right forearm x-ray  1. Impacted fracture of the radial neck junction. 2. Suspect additional nondisplaced fracture of the coronoid process. 3. Right oval swelling and trace joint effusion. 4. Degenerative arthrosis of the wrist and elbow. Enthesopathic changes of the olecranon.  07-22-19: right shoulder x-ray  1. The osseous structures appear diffusely demineralized which may limit detection of small or nondisplaced fractures. 2. No acute fracture or traumatic malalignment. 3. High-riding appearance of the humeral head likely reflecting rotator cuff insufficiency. 4. Moderate osteoarthrosis of the acromioclavicular and glenohumeral joints.  03-21-20: chest x-ray: Mild congestive heart failure   NO NEW EXAMS.   LABS REVIEWED PREVIOUS:   09-06-19: wbc 4.4; hgb 10.8; hct 34.9; mcv 84.7 plt 261; glucose 81; bun 26; creat 1.21; k+ 3.5; na++ 141; ca 9.1 liver normal albumin 3.5    02-24-20: wbc 4.9; hgb 9.5; hct 30.0 mcv 82.2 plt 160; glucose 82; bun 49; creat 1.54; k+ 3.4; na++ 140; ca 8.7 liver normal albumin 3.4 02-27-20: glucose 84; bun 41; creat 1.29; k+ 3.9; na++ 142; ca 9.0  03-12-20: glucose 87; bun 45; creat 1.26; k+ 3.7; na++ 141; ca 9.0 03-18-20: tsh 1.548 vit B 12: 529 03-30-20: glucose 88; bun 26; creat 1.33; k+ 2.9; na++ 140 ca 9.0  03-31-20: k+ 3.6  NO NEW LABS.   Review of Systems  Constitutional: Negative for malaise/fatigue.    Respiratory: Negative for cough and shortness of breath.   Cardiovascular: Negative for chest pain, palpitations and leg swelling.  Gastrointestinal: Negative for abdominal pain, constipation and heartburn.  Musculoskeletal: Negative for back pain, joint pain and myalgias.  Skin: Negative.   Neurological: Negative for dizziness.  Psychiatric/Behavioral: The patient is not nervous/anxious.    Physical Exam Constitutional:      General: She is not in acute distress.    Appearance: She is well-developed. She is not diaphoretic.  Neck:     Thyroid: No thyromegaly.  Cardiovascular:     Rate and Rhythm: Normal rate and regular rhythm.     Pulses: Normal pulses.     Heart sounds: Normal heart sounds.  Pulmonary:     Effort: Pulmonary effort is normal. No respiratory distress.     Breath sounds: Normal breath sounds.     Comments: 02 Abdominal:     General: Bowel sounds are normal. There is no distension.     Palpations: Abdomen is soft.     Tenderness: There is no abdominal tenderness.  Musculoskeletal:        General: Normal range of motion.     Cervical back: Neck supple.     Right lower leg: No edema.     Left lower leg: No edema.  Lymphadenopathy:     Cervical: No cervical adenopathy.  Skin:    General: Skin is warm and dry.     Comments: Bilateral lower extremities discolored    Neurological:     Mental Status: She is alert. Mental status is at baseline.  Psychiatric:        Mood and Affect: Mood normal.       ASSESSMENT/ PLAN:  TODAY  1. Late onset alzheimer's disease with behavioral disturbance 2. Depression with anxiety  Will wean off ativan Will continue celexa 30 mg daily (has failed one  wean in the past) Will continue to monitor her status.   MD is aware of resident's narcotic use and is in agreement with current plan of care. We will attempt to wean resident as appropriate.  Ok Edwards NP [facility]  Contact [phone] Monday  through Friday 8am- 5pm  After hours call [phone]

## 2020-04-28 ENCOUNTER — Other Ambulatory Visit: Payer: Self-pay | Admitting: Adult Health

## 2020-04-28 DIAGNOSIS — I130 Hypertensive heart and chronic kidney disease with heart failure and stage 1 through stage 4 chronic kidney disease, or unspecified chronic kidney disease: Secondary | ICD-10-CM | POA: Diagnosis not present

## 2020-04-28 DIAGNOSIS — R1312 Dysphagia, oropharyngeal phase: Secondary | ICD-10-CM | POA: Diagnosis not present

## 2020-04-28 DIAGNOSIS — M6281 Muscle weakness (generalized): Secondary | ICD-10-CM | POA: Diagnosis not present

## 2020-04-28 DIAGNOSIS — R488 Other symbolic dysfunctions: Secondary | ICD-10-CM | POA: Diagnosis not present

## 2020-04-28 DIAGNOSIS — Z741 Need for assistance with personal care: Secondary | ICD-10-CM | POA: Diagnosis not present

## 2020-04-28 DIAGNOSIS — F0390 Unspecified dementia without behavioral disturbance: Secondary | ICD-10-CM | POA: Diagnosis not present

## 2020-04-29 DIAGNOSIS — M6281 Muscle weakness (generalized): Secondary | ICD-10-CM | POA: Diagnosis not present

## 2020-04-29 DIAGNOSIS — I130 Hypertensive heart and chronic kidney disease with heart failure and stage 1 through stage 4 chronic kidney disease, or unspecified chronic kidney disease: Secondary | ICD-10-CM | POA: Diagnosis not present

## 2020-04-29 DIAGNOSIS — F0390 Unspecified dementia without behavioral disturbance: Secondary | ICD-10-CM | POA: Diagnosis not present

## 2020-04-29 DIAGNOSIS — R488 Other symbolic dysfunctions: Secondary | ICD-10-CM | POA: Diagnosis not present

## 2020-04-29 DIAGNOSIS — Z993 Dependence on wheelchair: Secondary | ICD-10-CM | POA: Diagnosis not present

## 2020-04-29 DIAGNOSIS — F411 Generalized anxiety disorder: Secondary | ICD-10-CM | POA: Diagnosis not present

## 2020-04-29 DIAGNOSIS — R1312 Dysphagia, oropharyngeal phase: Secondary | ICD-10-CM | POA: Diagnosis not present

## 2020-04-29 DIAGNOSIS — Z741 Need for assistance with personal care: Secondary | ICD-10-CM | POA: Diagnosis not present

## 2020-04-30 DIAGNOSIS — R488 Other symbolic dysfunctions: Secondary | ICD-10-CM | POA: Diagnosis not present

## 2020-04-30 DIAGNOSIS — I130 Hypertensive heart and chronic kidney disease with heart failure and stage 1 through stage 4 chronic kidney disease, or unspecified chronic kidney disease: Secondary | ICD-10-CM | POA: Diagnosis not present

## 2020-04-30 DIAGNOSIS — M6281 Muscle weakness (generalized): Secondary | ICD-10-CM | POA: Diagnosis not present

## 2020-04-30 DIAGNOSIS — Z741 Need for assistance with personal care: Secondary | ICD-10-CM | POA: Diagnosis not present

## 2020-04-30 DIAGNOSIS — M79601 Pain in right arm: Secondary | ICD-10-CM | POA: Diagnosis not present

## 2020-04-30 DIAGNOSIS — R1312 Dysphagia, oropharyngeal phase: Secondary | ICD-10-CM | POA: Diagnosis not present

## 2020-04-30 DIAGNOSIS — Z1159 Encounter for screening for other viral diseases: Secondary | ICD-10-CM | POA: Diagnosis not present

## 2020-04-30 DIAGNOSIS — F0390 Unspecified dementia without behavioral disturbance: Secondary | ICD-10-CM | POA: Diagnosis not present

## 2020-05-01 DIAGNOSIS — R1312 Dysphagia, oropharyngeal phase: Secondary | ICD-10-CM | POA: Diagnosis not present

## 2020-05-01 DIAGNOSIS — M6281 Muscle weakness (generalized): Secondary | ICD-10-CM | POA: Diagnosis not present

## 2020-05-01 DIAGNOSIS — R488 Other symbolic dysfunctions: Secondary | ICD-10-CM | POA: Diagnosis not present

## 2020-05-01 DIAGNOSIS — Z741 Need for assistance with personal care: Secondary | ICD-10-CM | POA: Diagnosis not present

## 2020-05-01 DIAGNOSIS — I130 Hypertensive heart and chronic kidney disease with heart failure and stage 1 through stage 4 chronic kidney disease, or unspecified chronic kidney disease: Secondary | ICD-10-CM | POA: Diagnosis not present

## 2020-05-01 DIAGNOSIS — F0390 Unspecified dementia without behavioral disturbance: Secondary | ICD-10-CM | POA: Diagnosis not present

## 2020-05-04 DIAGNOSIS — F0390 Unspecified dementia without behavioral disturbance: Secondary | ICD-10-CM | POA: Diagnosis not present

## 2020-05-04 DIAGNOSIS — I130 Hypertensive heart and chronic kidney disease with heart failure and stage 1 through stage 4 chronic kidney disease, or unspecified chronic kidney disease: Secondary | ICD-10-CM | POA: Diagnosis not present

## 2020-05-04 DIAGNOSIS — M79601 Pain in right arm: Secondary | ICD-10-CM | POA: Diagnosis not present

## 2020-05-04 DIAGNOSIS — R1312 Dysphagia, oropharyngeal phase: Secondary | ICD-10-CM | POA: Diagnosis not present

## 2020-05-04 DIAGNOSIS — M6281 Muscle weakness (generalized): Secondary | ICD-10-CM | POA: Diagnosis not present

## 2020-05-04 DIAGNOSIS — Z1159 Encounter for screening for other viral diseases: Secondary | ICD-10-CM | POA: Diagnosis not present

## 2020-05-04 DIAGNOSIS — R488 Other symbolic dysfunctions: Secondary | ICD-10-CM | POA: Diagnosis not present

## 2020-05-04 DIAGNOSIS — Z741 Need for assistance with personal care: Secondary | ICD-10-CM | POA: Diagnosis not present

## 2020-05-05 ENCOUNTER — Encounter: Payer: Self-pay | Admitting: Adult Health

## 2020-05-05 ENCOUNTER — Other Ambulatory Visit: Payer: Self-pay | Admitting: Adult Health

## 2020-05-05 ENCOUNTER — Non-Acute Institutional Stay (SKILLED_NURSING_FACILITY): Payer: Medicare Other | Admitting: Adult Health

## 2020-05-05 DIAGNOSIS — Z741 Need for assistance with personal care: Secondary | ICD-10-CM | POA: Diagnosis not present

## 2020-05-05 DIAGNOSIS — R1312 Dysphagia, oropharyngeal phase: Secondary | ICD-10-CM | POA: Diagnosis not present

## 2020-05-05 DIAGNOSIS — I130 Hypertensive heart and chronic kidney disease with heart failure and stage 1 through stage 4 chronic kidney disease, or unspecified chronic kidney disease: Secondary | ICD-10-CM | POA: Diagnosis not present

## 2020-05-05 DIAGNOSIS — F0390 Unspecified dementia without behavioral disturbance: Secondary | ICD-10-CM | POA: Diagnosis not present

## 2020-05-05 DIAGNOSIS — M6281 Muscle weakness (generalized): Secondary | ICD-10-CM | POA: Diagnosis not present

## 2020-05-05 DIAGNOSIS — R488 Other symbolic dysfunctions: Secondary | ICD-10-CM | POA: Diagnosis not present

## 2020-05-05 DIAGNOSIS — F418 Other specified anxiety disorders: Secondary | ICD-10-CM | POA: Diagnosis not present

## 2020-05-05 MED ORDER — LORAZEPAM 0.5 MG PO TABS: 0.2500 mg | ORAL_TABLET | Freq: Every day | ORAL | 0 refills | Status: DC

## 2020-05-05 NOTE — Progress Notes (Signed)
Location:    [facility] Room Number: 122/D Place of Service:  SNF (31)   CODE STATUS: DNR  Allergies  Allergen Reactions  . Bee Venom Anaphylaxis  . Ace Inhibitors   . Angiotensin Receptor Blockers     Tongue swelling.  . Aspirin     Directed by physician  . Cabbage Other (See Comments)    Certain foods due to gout flares  . Latex Rash    Chief Complaint  Patient presents with  . Acute Visit    Anxiety    HPI:  she  has had her anxiety stopped for her gradual dose reduction. Since then she has had hallucinations; is agitated; and has declined nursing care. She is presently taking celexa 30 mg daily and has failed one wean from this medication in the past. She is not tolerating coming off ativan at this time.   Past Medical History:  Diagnosis Date  . Anxiety   . Cellulitis 2016  . CHF (congestive heart failure) ([place])   . Chronic kidney disease   . Dementia ([place])   . Dysphagia   . Gait abnormality   . GERD (gastroesophageal reflux disease)   . Glaucoma   . Gout, unspecified 01/12/2010   Qualifier: Diagnosis of  By: Moshe Cipro MD, Joycelyn Schmid    . Hyperlipidemia   . Hypertension   . Muscle weakness   . Myocardial infarction [facility]) [place]  . Osteoarthritis   . Syncope     Past Surgical History:  Procedure Laterality Date  . APPENDECTOMY    . CATARACT EXTRACTION W/PHACO  08/04/2011   Procedure: CATARACT EXTRACTION PHACO AND INTRAOCULAR LENS PLACEMENT (IOC);  Surgeon: Tonny Branch;  Location: [facility] ORS;  Service: Ophthalmology;  Laterality: Right;  CDE=18.53  . CATARACT EXTRACTION W/PHACO  08/25/2011   Procedure: CATARACT EXTRACTION PHACO AND INTRAOCULAR LENS PLACEMENT (IOC);  Surgeon: Tonny Branch;  Location: [facility] ORS;  Service: Ophthalmology;  Laterality: Left;  CDE:14.76  . TONSILLECTOMY      Social History   Socioeconomic History  . Marital status: Divorced    Spouse name: Not on file  . Number of children: Not on file  . Years of  education: Not on file  . Highest education level: Not on file  Occupational History  . Occupation: retired   Tobacco Use  . Smoking status: Never Smoker  . Smokeless tobacco: Never Used  Vaping Use  . Vaping Use: Never used  Substance and Sexual Activity  . Alcohol use: No  . Drug use: No  . Sexual activity: Not Currently  Other Topics Concern  . Not on file  Social History Narrative   Long term resident [facility]    Social Determinants of Health   Financial Resource Strain:   . Difficulty of Paying Living Expenses: Not on file  Food Insecurity:   . Worried About Charity fundraiser in the Last Year: Not on file  . Ran Out of Food in the Last Year: Not on file  Transportation Needs:   . Lack of Transportation (Medical): Not on file  . Lack of Transportation (Non-Medical): Not on file  Physical Activity:   . Days of Exercise per Week: Not on file  . Minutes of Exercise per Session: Not on file  Stress:   . Feeling of Stress : Not on file  Social Connections:   . Frequency of Communication with Friends and Family: Not on file  . Frequency of Social Gatherings with  Friends and Family: Not on file  . Attends Religious Services: Not on file  . Active Member of Clubs or Organizations: Not on file  . Attends Archivist Meetings: Not on file  . Marital Status: Not on file  Intimate Partner Violence:   . Fear of Current or Ex-Partner: Not on file  . Emotionally Abused: Not on file  . Physically Abused: Not on file  . Sexually Abused: Not on file   Family History  Problem Relation Age of Onset  . Diabetes Father   . Diabetes Sister   . Diabetes Brother   . Stroke Sister   . Stroke Sister   . Anesthesia problems Neg Hx   . Hypotension Neg Hx   . Malignant hyperthermia Neg Hx   . Pseudochol deficiency Neg Hx       VITAL SIGNS BP (!) 149/69   Pulse 65   Temp 98.6 F (37 C) (Oral)   Resp 16   Ht 4\' 11"  (1.499 m)   Wt 126 lb 3.2 oz (57.2 kg)   SpO2 95%    BMI 25.49 kg/m   Outpatient Encounter Medications as of 05/05/2020  Medication Sig  . acetaminophen (TYLENOL) 500 MG tablet Take 1,000 mg by mouth every 8 (eight) hours as needed. For leg pain and arthritis. May have additional 1000 mg for break through pain once a day prn  . alum & mag hydroxide-simeth (MAALOX PLUS) 400-400-40 MG/5ML suspension Amt: 4 teaspoons; oral Special Instructions: for indigestion/heartburn Twice A Day - PRN PRN 1, PRN 2  . Alum & Mag Hydroxide-Simeth (MYLANTA PO) Take by mouth. liquid; regular; amt: 4 tsp.; oral Special Instructions: after breakfast Once A Day 09:00 AM  . Balsam [place]-Castor Oil (VENELEX) OINT Apply topically to sacrum and bilateral buttocks every shift and as needed  . Brinzolamide-Brimonidine (SIMBRINZA) 1-0.2 % SUSP One  drop into both eyes three times a day   . Calcium Carbonate-Vitamin D (CALCIUM-VITAMIN D) 500-200 MG-UNIT tablet Take 1 tablet by mouth 2 (two) times daily.  . citalopram (CELEXA) 10 MG tablet Take 30 mg by mouth daily.   . cycloSPORINE (RESTASIS) 0.05 % ophthalmic emulsion Place 1 drop into both eyes 2 (two) times daily.   Marland Kitchen Dextromethorphan-guaiFENesin (ROBITUSSIN DM PO) 15 ml by mouth every 6 hours for cough/congestion. May give up to 48 hrs. Inform physician immediately if cough or congestion is associated with fever or SOB Every 6 Hours - PRN  . docusate sodium (COLACE) 100 MG capsule Take 100 mg by mouth daily.   Marland Kitchen donepezil (ARICEPT) 5 MG tablet Take 5 mg by mouth daily. For dementia  . feeding supplement, GLUCERNA SHAKE, (GLUCERNA SHAKE) LIQD Take 237 mLs by mouth at bedtime.  . furosemide (LASIX) 20 MG tablet Take 40 mg by mouth 2 (two) times daily.   . hydrALAZINE (APRESOLINE) 50 MG tablet Take 50 mg by mouth 2 (two) times a day.   . ipratropium-albuterol (DUONEB) 0.5-2.5 (3) MG/3ML SOLN Take 3 mLs by nebulization every 6 (six) hours as needed.  . latanoprost (XALATAN) 0.005 % ophthalmic solution Place 1 drop into both  eyes at bedtime.  Marland Kitchen loratadine (CLARITIN) 10 MG tablet Take 10 mg by mouth daily.  Marland Kitchen LORazepam (ATIVAN) 0.5 MG tablet Take 0.5 tablets (0.25 mg total) by mouth daily.  . nitroGLYCERIN (NITROSTAT) 0.4 MG SL tablet Place 0.4 mg under the tongue every 5 (five) minutes as needed for chest pain.   . NON FORMULARY Diet Type:  Regular  .  OXYGEN Inhale 2 L/min into the lungs continuous. Wean to keep sats above 90%  . pantoprazole (PROTONIX) 40 MG tablet Take 40 mg by mouth daily. Do not crush.   . potassium chloride (K-DUR,KLOR-CON) 10 MEQ tablet Take 20 mEq by mouth 2 (two) times daily.    No facility-administered encounter medications on file as of 05/05/2020.     SIGNIFICANT DIAGNOSTIC EXAMS  PREVIOUS ;   07-22-19: right forearm x-ray  1. Impacted fracture of the radial neck junction. 2. Suspect additional nondisplaced fracture of the coronoid process. 3. Right oval swelling and trace joint effusion. 4. Degenerative arthrosis of the wrist and elbow. Enthesopathic changes of the olecranon.  07-22-19: right shoulder x-ray  1. The osseous structures appear diffusely demineralized which may limit detection of small or nondisplaced fractures. 2. No acute fracture or traumatic malalignment. 3. High-riding appearance of the humeral head likely reflecting rotator cuff insufficiency. 4. Moderate osteoarthrosis of the acromioclavicular and glenohumeral joints.  03-21-20: chest x-ray: Mild congestive heart failure   NO NEW EXAMS.   LABS REVIEWED PREVIOUS:   09-06-19: wbc 4.4; hgb 10.8; hct 34.9; mcv 84.7 plt 261; glucose 81; bun 26; creat 1.21; k+ 3.5; na++ 141; ca 9.1 liver normal albumin 3.5    02-24-20: wbc 4.9; hgb 9.5; hct 30.0 mcv 82.2 plt 160; glucose 82; bun 49; creat 1.54; k+ 3.4; na++ 140; ca 8.7 liver normal albumin 3.4 02-27-20: glucose 84; bun 41; creat 1.29; k+ 3.9; na++ 142; ca 9.0  03-12-20: glucose 87; bun 45; creat 1.26; k+ 3.7; na++ 141; ca 9.0 03-18-20: tsh 1.548 vit B 12:  529 03-30-20: glucose 88; bun 26; creat 1.33; k+ 2.9; na++ 140 ca 9.0  03-31-20: k+ 3.6  NO NEW LABS.    Review of Systems  Constitutional: Negative for malaise/fatigue.  Respiratory: Negative for cough and shortness of breath.   Cardiovascular: Negative for chest pain, palpitations and leg swelling.  Gastrointestinal: Negative for abdominal pain, constipation and heartburn.  Musculoskeletal: Negative for back pain, joint pain and myalgias.  Skin: Negative.   Neurological: Negative for dizziness.  Psychiatric/Behavioral: The patient is not nervous/anxious.     Physical Exam Constitutional:      General: She is not in acute distress.    Appearance: She is well-developed. She is not diaphoretic.  Neck:     Thyroid: No thyromegaly.  Cardiovascular:     Rate and Rhythm: Normal rate and regular rhythm.     Pulses: Normal pulses.     Heart sounds: Normal heart sounds.  Pulmonary:     Effort: Pulmonary effort is normal. No respiratory distress.     Breath sounds: Normal breath sounds.     Comments: 02 Abdominal:     General: Bowel sounds are normal. There is no distension.     Palpations: Abdomen is soft.     Tenderness: There is no abdominal tenderness.  Musculoskeletal:        General: Normal range of motion.     Cervical back: Neck supple.  Lymphadenopathy:     Cervical: No cervical adenopathy.  Skin:    General: Skin is warm and dry.     Comments: Bilateral lower extremities discolored   Neurological:     Mental Status: She is alert. Mental status is at baseline.  Psychiatric:        Mood and Affect: Mood normal.       ASSESSMENT/ PLAN:  TODAY  1. Depression with anxiety  Is worse Will return her ativan to  0.25 mg daily she has failed one wean Will monitor her status.   MD is aware of resident's narcotic use and is in agreement with current plan of care. We will attempt to wean resident as appropriate.  Ok Edwards NP [facility] Adult Medicine  Contact  [phone] Monday through Friday 8am- 5pm  After hours call [phone]

## 2020-05-06 DIAGNOSIS — Z741 Need for assistance with personal care: Secondary | ICD-10-CM | POA: Diagnosis not present

## 2020-05-06 DIAGNOSIS — F0390 Unspecified dementia without behavioral disturbance: Secondary | ICD-10-CM | POA: Diagnosis not present

## 2020-05-06 DIAGNOSIS — I130 Hypertensive heart and chronic kidney disease with heart failure and stage 1 through stage 4 chronic kidney disease, or unspecified chronic kidney disease: Secondary | ICD-10-CM | POA: Diagnosis not present

## 2020-05-06 DIAGNOSIS — R488 Other symbolic dysfunctions: Secondary | ICD-10-CM | POA: Diagnosis not present

## 2020-05-06 DIAGNOSIS — F411 Generalized anxiety disorder: Secondary | ICD-10-CM | POA: Diagnosis not present

## 2020-05-06 DIAGNOSIS — M6281 Muscle weakness (generalized): Secondary | ICD-10-CM | POA: Diagnosis not present

## 2020-05-06 DIAGNOSIS — R1312 Dysphagia, oropharyngeal phase: Secondary | ICD-10-CM | POA: Diagnosis not present

## 2020-05-07 ENCOUNTER — Encounter: Payer: Self-pay | Admitting: Adult Health

## 2020-05-07 ENCOUNTER — Non-Acute Institutional Stay (SKILLED_NURSING_FACILITY): Payer: Medicare Other | Admitting: Adult Health

## 2020-05-07 DIAGNOSIS — M170 Bilateral primary osteoarthritis of knee: Secondary | ICD-10-CM

## 2020-05-07 DIAGNOSIS — F0281 Dementia in other diseases classified elsewhere with behavioral disturbance: Secondary | ICD-10-CM

## 2020-05-07 DIAGNOSIS — E876 Hypokalemia: Secondary | ICD-10-CM | POA: Diagnosis not present

## 2020-05-07 DIAGNOSIS — R1312 Dysphagia, oropharyngeal phase: Secondary | ICD-10-CM | POA: Diagnosis not present

## 2020-05-07 DIAGNOSIS — R488 Other symbolic dysfunctions: Secondary | ICD-10-CM | POA: Diagnosis not present

## 2020-05-07 DIAGNOSIS — I130 Hypertensive heart and chronic kidney disease with heart failure and stage 1 through stage 4 chronic kidney disease, or unspecified chronic kidney disease: Secondary | ICD-10-CM | POA: Diagnosis not present

## 2020-05-07 DIAGNOSIS — Z741 Need for assistance with personal care: Secondary | ICD-10-CM | POA: Diagnosis not present

## 2020-05-07 DIAGNOSIS — M6281 Muscle weakness (generalized): Secondary | ICD-10-CM | POA: Diagnosis not present

## 2020-05-07 DIAGNOSIS — F0390 Unspecified dementia without behavioral disturbance: Secondary | ICD-10-CM | POA: Diagnosis not present

## 2020-05-07 DIAGNOSIS — G301 Alzheimer's disease with late onset: Secondary | ICD-10-CM | POA: Diagnosis not present

## 2020-05-07 NOTE — Progress Notes (Signed)
Location:    [facility] Room Number: 122/D Place of Service:  SNF (31)   CODE STATUS: DNR  Allergies  Allergen Reactions   Bee Venom Anaphylaxis   Ace Inhibitors    Angiotensin Receptor Blockers     Tongue swelling.   Aspirin     Directed by physician   Cabbage Other (See Comments)    Certain foods due to gout flares   Latex Rash    Chief Complaint  Patient presents with   Medical Management of Chronic Issues            Hypokalemia    Vascular dementia with behavioral disturbance unspecified dementia type:Marland Kitchen Primary osteoarthritis multiple joints    HPI:  She is a 86 year old long term resident of this facility being seen for the management of her chronic illnesses:  Hypokalemia    Vascular dementia with behavioral disturbance unspecified dementia type:Marland Kitchen Primary osteoarthritis multiple joints. There are no reports of uncontrolled pain. She has failed her wean off ativan and was restarted. There are no reports of constipation or heart burn.   Past Medical History:  Diagnosis Date   Anxiety    Cellulitis 2016   CHF (congestive heart failure) (HCC)    Chronic kidney disease    Dementia (H[facility])    Dysphagia    Gait abnormality    GERD (gastroesophageal reflux disease)    Glaucoma    Gout, unspecified 01/12/2010   Qualifier: Diagnosis of  By: Moshe Cipro MD, Margaret     Hyperlipidemia    Hypertension    Muscle weakness    Myocardial infarction ([place]) 1990s   [place]   Osteoarthritis    Syncope     Past Surgical History:  Procedure Laterality Date   APPENDECTOMY     CATARACT EXTRACTION W/PHACO  08/04/2011   Procedure: CATARACT EXTRACTION PHACO AND INTRAOCULAR LENS PLACEMENT ([place]);  Surgeon: Tonny Branch;  Location: [facility];  Service: Ophthalmology;  Laterality: Right;  CDE=18.53   CATARACT EXTRACTION W/PHACO  08/25/2011   Procedure: CATARACT EXTRACTION PHACO AND INTRAOCULAR LENS PLACEMENT (IOC);  Surgeon: Tonny Branch;   Location: [facility];  Service: Ophthalmology;  Laterality: Left;  CDE:14.76   TONSILLECTOMY      Social History   Socioeconomic History   Marital status: Divorced    Spouse name: Not on file   Number of children: Not on file   Years of education: Not on file   Highest education level: Not on file  Occupational History   Occupation: retired   Tobacco Use   Smoking status: Never Smoker   Smokeless tobacco: Never Used  Scientific laboratory technician Use: Never used  Substance and Sexual Activity   Alcohol use: No   Drug use: No   Sexual activity: Not Currently  Other Topics Concern   Not on file  Social History Narrative   Long term resident [facility]    Social Determinants of Health   Financial Resource Strain:    Difficulty of Paying Living Expenses: Not on file  Food Insecurity:    Worried About Charity fundraiser in the Last Year: Not on file   [place] in the Last Year: Not on file  Transportation Needs:    Lack of Transportation (Medical): Not on file   Lack of Transportation (Non-Medical): Not on file  Physical Activity:    Days of Exercise per Week: Not on file   Minutes of Exercise per Session: Not on  file  Stress:    Feeling of Stress : Not on file  Social Connections:    Frequency of Communication with Friends and Family: Not on file   Frequency of Social Gatherings with Friends and Family: Not on file   Attends Religious Services: Not on file   Active Member of Clubs or Organizations: Not on file   Attends Archivist Meetings: Not on file   Marital Status: Not on file  Intimate Partner Violence:    Fear of Current or Ex-Partner: Not on file   Emotionally Abused: Not on file   Physically Abused: Not on file   Sexually Abused: Not on file   Family History  Problem Relation Age of Onset   Diabetes Father    Diabetes Sister    Diabetes Brother    Stroke Sister    Stroke Sister    Anesthesia problems Neg Hx     Hypotension Neg Hx    Malignant hyperthermia Neg Hx    Pseudochol deficiency Neg Hx       VITAL SIGNS BP (!) 149/69    Pulse 65    Temp 98.2 F (36.8 C) (Oral)    Resp 16    Ht 4\' 11"  (1.499 m)    Wt 126 lb 3.2 oz (57.2 kg)    SpO2 97%    BMI 25.49 kg/m   Outpatient Encounter Medications as of 05/07/2020  Medication Sig   acetaminophen (TYLENOL) 500 MG tablet Take 1,000 mg by mouth every 8 (eight) hours as needed. For leg pain and arthritis. May have additional 1000 mg for break through pain once a day prn   alum & mag hydroxide-simeth (MAALOX PLUS) 400-400-40 MG/5ML suspension Amt: 4 teaspoons; oral Special Instructions: for indigestion/heartburn Twice A Day - PRN PRN 1, PRN 2   Alum & Mag Hydroxide-Simeth (MYLANTA PO) Take by mouth. liquid; regular; amt: 4 tsp.; oral Special Instructions: after breakfast Once A Day 09:00 AM   Balsam [place]-Castor Oil (VENELEX) OINT Apply topically to sacrum and bilateral buttocks every shift and as needed   Brinzolamide-Brimonidine (SIMBRINZA) 1-0.2 % SUSP One  drop into both eyes three times a day    Calcium Carbonate-Vitamin D (CALCIUM-VITAMIN D) 500-200 MG-UNIT tablet Take 1 tablet by mouth 2 (two) times daily.   citalopram (CELEXA) 10 MG tablet Take 30 mg by mouth daily.    cycloSPORINE (RESTASIS) 0.05 % ophthalmic emulsion Place 1 drop into both eyes 2 (two) times daily.    Dextromethorphan-guaiFENesin (ROBITUSSIN DM PO) 15 ml by mouth every 6 hours for cough/congestion. May give up to 48 hrs. Inform physician immediately if cough or congestion is associated with fever or SOB Every 6 Hours - PRN   docusate sodium (COLACE) 100 MG capsule Take 100 mg by mouth daily.    donepezil (ARICEPT) 5 MG tablet Take 5 mg by mouth daily. For dementia   furosemide (LASIX) 20 MG tablet Take 40 mg by mouth 2 (two) times daily.    hydrALAZINE (APRESOLINE) 50 MG tablet Take 50 mg by mouth 2 (two) times a day.    ipratropium-albuterol (DUONEB)  0.5-2.5 (3) MG/3ML SOLN Take 3 mLs by nebulization every 6 (six) hours as needed.   latanoprost (XALATAN) 0.005 % ophthalmic solution Place 1 drop into both eyes at bedtime.   loratadine (CLARITIN) 10 MG tablet Take 10 mg by mouth daily.   LORazepam (ATIVAN) 0.5 MG tablet Take 0.5 tablets (0.25 mg total) by mouth daily.   nitroGLYCERIN (NITROSTAT) 0.4 MG  SL tablet Place 0.4 mg under the tongue every 5 (five) minutes as needed for chest pain.    NON FORMULARY Diet Type:  Regular   OXYGEN Inhale 2 L/min into the lungs continuous. Wean to keep sats above 90%   pantoprazole (PROTONIX) 40 MG tablet Take 40 mg by mouth daily. Do not crush.    potassium chloride (K-DUR,KLOR-CON) 10 MEQ tablet Take 20 mEq by mouth 2 (two) times daily.    [DISCONTINUED] feeding supplement, GLUCERNA SHAKE, (GLUCERNA SHAKE) LIQD Take 237 mLs by mouth at bedtime.   No facility-administered encounter medications on file as of 05/07/2020.     SIGNIFICANT DIAGNOSTIC EXAMS  PREVIOUS ;   07-22-19: right forearm x-ray  1. Impacted fracture of the radial neck junction. 2. Suspect additional nondisplaced fracture of the coronoid process. 3. Right oval swelling and trace joint effusion. 4. Degenerative arthrosis of the wrist and elbow. Enthesopathic changes of the olecranon.  07-22-19: right shoulder x-ray  1. The osseous structures appear diffusely demineralized which may limit detection of small or nondisplaced fractures. 2. No acute fracture or traumatic malalignment. 3. High-riding appearance of the humeral head likely reflecting rotator cuff insufficiency. 4. Moderate osteoarthrosis of the acromioclavicular and glenohumeral joints.  03-21-20: chest x-ray: Mild congestive heart failure   NO NEW EXAMS.   LABS REVIEWED PREVIOUS:    09-06-19: wbc 4.4; hgb 10.8; hct 34.9; mcv 84.7 plt 261; glucose 81; bun 26; creat 1.21; k+ 3.5; na++ 141; ca 9.1 liver normal albumin 3.5    02-24-20: wbc 4.9; hgb 9.5; hct 30.0  mcv 82.2 plt 160; glucose 82; bun 49; creat 1.54; k+ 3.4; na++ 140; ca 8.7 liver normal albumin 3.4 02-27-20: glucose 84; bun 41; creat 1.29; k+ 3.9; na++ 142; ca 9.0  03-12-20: glucose 87; bun 45; creat 1.26; k+ 3.7; na++ 141; ca 9.0 03-18-20: tsh 1.548 vit B 12: 529 03-30-20: glucose 88; bun 26; creat 1.33; k+ 2.9; na++ 140 ca 9.0  03-31-20: k+ 3.6  NO NEW LABS.   Review of Systems  Constitutional: Negative for malaise/fatigue.  Respiratory: Negative for cough and shortness of breath.   Cardiovascular: Negative for chest pain, palpitations and leg swelling.  Gastrointestinal: Negative for abdominal pain, constipation and heartburn.  Musculoskeletal: Negative for back pain, joint pain and myalgias.  Skin: Negative.   Neurological: Negative for dizziness.  Psychiatric/Behavioral: The patient is not nervous/anxious.     Physical Exam Constitutional:      General: She is not in acute distress.    Appearance: She is well-developed. She is not diaphoretic.  Neck:     Thyroid: No thyromegaly.  Cardiovascular:     Rate and Rhythm: Normal rate and regular rhythm.     Heart sounds: Normal heart sounds.  Pulmonary:     Effort: Pulmonary effort is normal. No respiratory distress.     Breath sounds: Normal breath sounds.     Comments: 02 Abdominal:     General: Bowel sounds are normal. There is no distension.     Palpations: Abdomen is soft.     Tenderness: There is no abdominal tenderness.  Musculoskeletal:        General: Normal range of motion.     Cervical back: Neck supple.     Right lower leg: No edema.     Left lower leg: No edema.  Lymphadenopathy:     Cervical: No cervical adenopathy.  Skin:    General: Skin is warm and dry.     Comments: Bilateral lower  extremities discolored    Neurological:     Mental Status: She is alert. Mental status is at baseline.  Psychiatric:        Mood and Affect: Mood normal.       ASSESSMENT/ PLAN:  TODAY:   1. Hypokalemia is stable  will continue k+ 10 meq daily   2. Vascular dementia with behavioral disturbance unspecified dementia type: is without change weight is 126 pounds; albumin 3.9; will continue aricept 5 mg daily   3. Primary osteoarthritis multiple joints: is stable will continue tylenol 1 gm twice daily    PREVIOUS  4. CKD (chronic kidney disease) stage 3 GFR 30-59 ml/min is stable bun 27; creat 1.09   5. Chronic constipation: is stable will continue colace twice daily   6. Depression with anxiety: is stable will continue celexa 30 mg daily ativan 0.25 mg daily has failed one wean from celexa and ativan.   7. Chronic diastolic CHF (congestive heart failure) EF 65-70% (03-04-18) is without change  will continue apresoline 50 mg twice daily lasix 20 mg daily and has prn ntg   8. Deep vein thrombosis (DVT) of left lower extremity unspecified chronicity unspecified vein: is stable will continue to monitor her status.   9. Chronic non-seasonal allergic rhinitis: is stable will continue claritin 10 mg daily   10. 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 GERD without esophagitis: is stable will continue protonix 40 mg daily   12. Chronic respiratory failure with hypoxia on home 02 therapy: no changes in status will continue claritin 10 mg daily   13. Increased intraocular pressure bilateral is stable will continue simbrinza three times daily and travatan nightly to both eyes.     MD is aware of resident's narcotic use and is in agreement with current plan of care. We will attempt to wean resident as appropriate.  Ok Edwards NP [facility]  Contact [phone] Monday through Friday 8am- 5pm  After hours call [phone]

## 2020-05-08 DIAGNOSIS — Z1159 Encounter for screening for other viral diseases: Secondary | ICD-10-CM | POA: Diagnosis not present

## 2020-05-08 DIAGNOSIS — Z741 Need for assistance with personal care: Secondary | ICD-10-CM | POA: Diagnosis not present

## 2020-05-08 DIAGNOSIS — F0390 Unspecified dementia without behavioral disturbance: Secondary | ICD-10-CM | POA: Diagnosis not present

## 2020-05-08 DIAGNOSIS — R488 Other symbolic dysfunctions: Secondary | ICD-10-CM | POA: Diagnosis not present

## 2020-05-08 DIAGNOSIS — M79601 Pain in right arm: Secondary | ICD-10-CM | POA: Diagnosis not present

## 2020-05-08 DIAGNOSIS — M6281 Muscle weakness (generalized): Secondary | ICD-10-CM | POA: Diagnosis not present

## 2020-05-08 DIAGNOSIS — R1312 Dysphagia, oropharyngeal phase: Secondary | ICD-10-CM | POA: Diagnosis not present

## 2020-05-08 DIAGNOSIS — I130 Hypertensive heart and chronic kidney disease with heart failure and stage 1 through stage 4 chronic kidney disease, or unspecified chronic kidney disease: Secondary | ICD-10-CM | POA: Diagnosis not present

## 2020-05-11 DIAGNOSIS — R1312 Dysphagia, oropharyngeal phase: Secondary | ICD-10-CM | POA: Diagnosis not present

## 2020-05-11 DIAGNOSIS — I130 Hypertensive heart and chronic kidney disease with heart failure and stage 1 through stage 4 chronic kidney disease, or unspecified chronic kidney disease: Secondary | ICD-10-CM | POA: Diagnosis not present

## 2020-05-11 DIAGNOSIS — F0390 Unspecified dementia without behavioral disturbance: Secondary | ICD-10-CM | POA: Diagnosis not present

## 2020-05-11 DIAGNOSIS — Z741 Need for assistance with personal care: Secondary | ICD-10-CM | POA: Diagnosis not present

## 2020-05-11 DIAGNOSIS — M79601 Pain in right arm: Secondary | ICD-10-CM | POA: Diagnosis not present

## 2020-05-11 DIAGNOSIS — Z1159 Encounter for screening for other viral diseases: Secondary | ICD-10-CM | POA: Diagnosis not present

## 2020-05-11 DIAGNOSIS — R488 Other symbolic dysfunctions: Secondary | ICD-10-CM | POA: Diagnosis not present

## 2020-05-11 DIAGNOSIS — M6281 Muscle weakness (generalized): Secondary | ICD-10-CM | POA: Diagnosis not present

## 2020-05-12 DIAGNOSIS — Z741 Need for assistance with personal care: Secondary | ICD-10-CM | POA: Diagnosis not present

## 2020-05-12 DIAGNOSIS — M6281 Muscle weakness (generalized): Secondary | ICD-10-CM | POA: Diagnosis not present

## 2020-05-12 DIAGNOSIS — I130 Hypertensive heart and chronic kidney disease with heart failure and stage 1 through stage 4 chronic kidney disease, or unspecified chronic kidney disease: Secondary | ICD-10-CM | POA: Diagnosis not present

## 2020-05-12 DIAGNOSIS — F0390 Unspecified dementia without behavioral disturbance: Secondary | ICD-10-CM | POA: Diagnosis not present

## 2020-05-12 DIAGNOSIS — R1312 Dysphagia, oropharyngeal phase: Secondary | ICD-10-CM | POA: Diagnosis not present

## 2020-05-12 DIAGNOSIS — R488 Other symbolic dysfunctions: Secondary | ICD-10-CM | POA: Diagnosis not present

## 2020-05-13 DIAGNOSIS — M6281 Muscle weakness (generalized): Secondary | ICD-10-CM | POA: Diagnosis not present

## 2020-05-13 DIAGNOSIS — I130 Hypertensive heart and chronic kidney disease with heart failure and stage 1 through stage 4 chronic kidney disease, or unspecified chronic kidney disease: Secondary | ICD-10-CM | POA: Diagnosis not present

## 2020-05-13 DIAGNOSIS — F0390 Unspecified dementia without behavioral disturbance: Secondary | ICD-10-CM | POA: Diagnosis not present

## 2020-05-13 DIAGNOSIS — Z741 Need for assistance with personal care: Secondary | ICD-10-CM | POA: Diagnosis not present

## 2020-05-13 DIAGNOSIS — R1312 Dysphagia, oropharyngeal phase: Secondary | ICD-10-CM | POA: Diagnosis not present

## 2020-05-13 DIAGNOSIS — R488 Other symbolic dysfunctions: Secondary | ICD-10-CM | POA: Diagnosis not present

## 2020-05-13 DIAGNOSIS — F411 Generalized anxiety disorder: Secondary | ICD-10-CM | POA: Diagnosis not present

## 2020-05-14 ENCOUNTER — Non-Acute Institutional Stay (SKILLED_NURSING_FACILITY): Payer: Medicare Other | Admitting: Adult Health

## 2020-05-14 ENCOUNTER — Encounter: Payer: Self-pay | Admitting: Adult Health

## 2020-05-14 DIAGNOSIS — I5032 Chronic diastolic (congestive) heart failure: Secondary | ICD-10-CM

## 2020-05-14 DIAGNOSIS — F418 Other specified anxiety disorders: Secondary | ICD-10-CM | POA: Diagnosis not present

## 2020-05-14 DIAGNOSIS — G301 Alzheimer's disease with late onset: Secondary | ICD-10-CM | POA: Diagnosis not present

## 2020-05-14 DIAGNOSIS — F02818 Dementia in other diseases classified elsewhere, unspecified severity, with other behavioral disturbance: Secondary | ICD-10-CM

## 2020-05-14 DIAGNOSIS — I130 Hypertensive heart and chronic kidney disease with heart failure and stage 1 through stage 4 chronic kidney disease, or unspecified chronic kidney disease: Secondary | ICD-10-CM

## 2020-05-14 DIAGNOSIS — Z1159 Encounter for screening for other viral diseases: Secondary | ICD-10-CM | POA: Diagnosis not present

## 2020-05-14 DIAGNOSIS — M79601 Pain in right arm: Secondary | ICD-10-CM | POA: Diagnosis not present

## 2020-05-14 DIAGNOSIS — N1832 Chronic kidney disease, stage 3b: Secondary | ICD-10-CM | POA: Diagnosis not present

## 2020-05-14 DIAGNOSIS — F0281 Dementia in other diseases classified elsewhere with behavioral disturbance: Secondary | ICD-10-CM | POA: Diagnosis not present

## 2020-05-14 NOTE — Progress Notes (Signed)
Location:    [place] Room Number: 122/D Place of Service:  SNF (31)   CODE STATUS: DNR  Allergies  Allergen Reactions   Bee Venom Anaphylaxis   Ace Inhibitors    Angiotensin Receptor Blockers     Tongue swelling.   Aspirin     Directed by physician   Cabbage Other (See Comments)    Certain foods due to gout flares   Latex Rash    Chief Complaint  Patient presents with   Acute Visit    Care Plan Meeting    HPI:  We have come together for her care plan meeting. Family present. BIMS 13/15 mood 7/30. No reports of recent falls. She is getting out of bed frequently. She requires limited to extensive assist with her adls. Is frequently incontinent of bladder and bowel. She has nearly 40 pounds of fluid; with her weight being stable over the past month. There are no reports of uncontrolled pain. She did not tolerate the wean off ativan; and has improved since then. She continues to be followed for her chronic illnesses including: Late onset alzheimer's disease with behavioral disturbance  Depression with anxiety   hypertensive heart and kidney disease with chronic diastolic congestive heart failure and stage 3b chronic kidney disease  Past Medical History:  Diagnosis Date   Anxiety    Cellulitis 2016   CHF (congestive heart failure) (HCC)    Chronic kidney disease    Dementia (HCC)    Dysphagia    Gait abnormality    GERD (gastroesophageal reflux disease)    Glaucoma    Gout, unspecified 01/12/2010   Qualifier: Diagnosis of  By: Moshe Cipro MD, Margaret     Hyperlipidemia    Hypertension    Muscle weakness    Myocardial infarction ([place]) 1990s   [place]   Osteoarthritis    Syncope     Past Surgical History:  Procedure Laterality Date   APPENDECTOMY     CATARACT EXTRACTION W/PHACO  08/04/2011   Procedure: CATARACT EXTRACTION PHACO AND INTRAOCULAR LENS PLACEMENT ([place]);  Surgeon: Tonny Branch;  Location: [facility];  Service:  Ophthalmology;  Laterality: Right;  CDE=18.53   CATARACT EXTRACTION W/PHACO  08/25/2011   Procedure: CATARACT EXTRACTION PHACO AND INTRAOCULAR LENS PLACEMENT (I[facility]);  Surgeon: Tonny Branch;  Location: [facility];  Service: Ophthalmology;  Laterality: Left;  CDE:14.76   TONSILLECTOMY      Social History   Socioeconomic History   Marital status: Divorced    Spouse name: Not on file   Number of children: Not on file   Years of education: Not on file   Highest education level: Not on file  Occupational History   Occupation: retired   Tobacco Use   Smoking status: Never Smoker   Smokeless tobacco: Never Used  Scientific laboratory technician Use: Never used  Substance and Sexual Activity   Alcohol use: No   Drug use: No   Sexual activity: Not Currently  Other Topics Concern   Not on file  Social History Narrative   Long term resident [facility]    Social Determinants of Health   Financial Resource Strain:    Difficulty of Paying Living Expenses: Not on file  Food Insecurity:    Worried About Charity fundraiser in the Last Year: Not on file   [place] in the Last Year: Not on file  Transportation Needs:    Lack of Transportation (Medical): Not on file  Lack of Transportation (Non-Medical): Not on file  Physical Activity:    Days of Exercise per Week: Not on file   Minutes of Exercise per Session: Not on file  Stress:    Feeling of Stress : Not on file  Social Connections:    Frequency of Communication with Friends and Family: Not on file   Frequency of Social Gatherings with Friends and Family: Not on file   Attends Religious Services: Not on file   Active Member of Clubs or Organizations: Not on file   Attends Archivist Meetings: Not on file   Marital Status: Not on file  Intimate Partner Violence:    Fear of Current or Ex-Partner: Not on file   Emotionally Abused: Not on file   Physically Abused: Not on file   Sexually Abused: Not on file     Family History  Problem Relation Age of Onset   Diabetes Father    Diabetes Sister    Diabetes Brother    Stroke Sister    Stroke Sister    Anesthesia problems Neg Hx    Hypotension Neg Hx    Malignant hyperthermia Neg Hx    Pseudochol deficiency Neg Hx       VITAL SIGNS BP (!) 138/54    Pulse (!) 56    Temp 98.6 F (37 C) (Oral)    Resp 18    Ht 4\' 11"  (1.499 m)    Wt 126 lb 3.2 oz (57.2 kg)    SpO2 96%    BMI 25.49 kg/m   Outpatient Encounter Medications as of 05/14/2020  Medication Sig   acetaminophen (TYLENOL) 500 MG tablet Take 1,000 mg by mouth every 8 (eight) hours as needed. For leg pain and arthritis. May have additional 1000 mg for break through pain once a day prn   alum & mag hydroxide-simeth (MAALOX PLUS) 400-400-40 MG/5ML suspension Amt: 4 teaspoons; oral Special Instructions: for indigestion/heartburn Twice A Day - PRN PRN 1, PRN 2   Alum & Mag Hydroxide-Simeth (MYLANTA PO) Take by mouth. liquid; regular; amt: 4 tsp.; oral Special Instructions: after breakfast Once A Day 09:00 AM   Balsam [place]-Castor Oil (VENELEX) OINT Apply topically to sacrum and bilateral buttocks every shift and as needed   Brinzolamide-Brimonidine (SIMBRINZA) 1-0.2 % SUSP One  drop into both eyes three times a day    Calcium Carbonate-Vitamin D (CALCIUM-VITAMIN D) 500-200 MG-UNIT tablet Take 1 tablet by mouth 2 (two) times daily.   citalopram (CELEXA) 10 MG tablet Take 30 mg by mouth daily.    cycloSPORINE (RESTASIS) 0.05 % ophthalmic emulsion Place 1 drop into both eyes 2 (two) times daily.    Dextromethorphan-guaiFENesin (ROBITUSSIN DM PO) 15 ml by mouth every 6 hours for cough/congestion. May give up to 48 hrs. Inform physician immediately if cough or congestion is associated with fever or SOB Every 6 Hours - PRN   docusate sodium (COLACE) 100 MG capsule Take 100 mg by mouth daily.    donepezil (ARICEPT) 5 MG tablet Take 5 mg by mouth daily. For dementia    furosemide (LASIX) 20 MG tablet Take 40 mg by mouth 2 (two) times daily.    hydrALAZINE (APRESOLINE) 50 MG tablet Take 50 mg by mouth 2 (two) times a day.    ipratropium-albuterol (DUONEB) 0.5-2.5 (3) MG/3ML SOLN Take 3 mLs by nebulization every 6 (six) hours as needed.   latanoprost (XALATAN) 0.005 % ophthalmic solution Place 1 drop into both eyes at bedtime.   loratadine (  CLARITIN) 10 MG tablet Take 10 mg by mouth daily.   LORazepam (ATIVAN) 0.5 MG tablet Take 0.5 tablets (0.25 mg total) by mouth daily.   nitroGLYCERIN (NITROSTAT) 0.4 MG SL tablet Place 0.4 mg under the tongue every 5 (five) minutes as needed for chest pain.    NON FORMULARY Diet Type:  Regular   OXYGEN Inhale 2 L/min into the lungs continuous. Wean to keep sats above 90%   pantoprazole (PROTONIX) 40 MG tablet Take 40 mg by mouth daily. Do not crush.    potassium chloride (K-DUR,KLOR-CON) 10 MEQ tablet Take 20 mEq by mouth 2 (two) times daily.    No facility-administered encounter medications on file as of 05/14/2020.     SIGNIFICANT DIAGNOSTIC EXAMS   PREVIOUS ;   07-22-19: right forearm x-ray  1. Impacted fracture of the radial neck junction. 2. Suspect additional nondisplaced fracture of the coronoid process. 3. Right oval swelling and trace joint effusion. 4. Degenerative arthrosis of the wrist and elbow. Enthesopathic changes of the olecranon.  07-22-19: right shoulder x-ray  1. The osseous structures appear diffusely demineralized which may limit detection of small or nondisplaced fractures. 2. No acute fracture or traumatic malalignment. 3. High-riding appearance of the humeral head likely reflecting rotator cuff insufficiency. 4. Moderate osteoarthrosis of the acromioclavicular and glenohumeral joints.  03-21-20: chest x-ray: Mild congestive heart failure   NO NEW EXAMS.   LABS REVIEWED PREVIOUS:    09-06-19: wbc 4.4; hgb 10.8; hct 34.9; mcv 84.7 plt 261; glucose 81; bun 26; creat 1.21; k+ 3.5;  na++ 141; ca 9.1 liver normal albumin 3.5    02-24-20: wbc 4.9; hgb 9.5; hct 30.0 mcv 82.2 plt 160; glucose 82; bun 49; creat 1.54; k+ 3.4; na++ 140; ca 8.7 liver normal albumin 3.4 02-27-20: glucose 84; bun 41; creat 1.29; k+ 3.9; na++ 142; ca 9.0  03-12-20: glucose 87; bun 45; creat 1.26; k+ 3.7; na++ 141; ca 9.0 03-18-20: tsh 1.548 vit B 12: 529 03-30-20: glucose 88; bun 26; creat 1.33; k+ 2.9; na++ 140 ca 9.0  03-31-20: k+ 3.6  NO NEW LABS.  Review of Systems  Constitutional: Negative for malaise/fatigue.  Respiratory: Negative for cough and shortness of breath.   Cardiovascular: Negative for chest pain, palpitations and leg swelling.  Gastrointestinal: Negative for abdominal pain, constipation and heartburn.  Musculoskeletal: Negative for back pain, joint pain and myalgias.  Skin: Negative.   Neurological: Negative for dizziness.  Psychiatric/Behavioral: The patient is not nervous/anxious.     Physical Exam Constitutional:      General: She is not in acute distress.    Appearance: She is well-developed. She is not diaphoretic.  Neck:     Thyroid: No thyromegaly.  Cardiovascular:     Rate and Rhythm: Normal rate and regular rhythm.     Pulses: Normal pulses.     Heart sounds: Normal heart sounds.  Pulmonary:     Effort: Pulmonary effort is normal. No respiratory distress.     Breath sounds: Normal breath sounds.     Comments: 02  Abdominal:     General: Bowel sounds are normal. There is no distension.     Palpations: Abdomen is soft.     Tenderness: There is no abdominal tenderness.  Musculoskeletal:        General: Normal range of motion.     Cervical back: Neck supple.     Right lower leg: No edema.     Left lower leg: No edema.  Lymphadenopathy:  Cervical: No cervical adenopathy.  Skin:    General: Skin is warm and dry.     Comments: Bilateral lower extremities discolored   Neurological:     Mental Status: She is alert. Mental status is at baseline.  Psychiatric:         Mood and Affect: Mood normal.      ASSESSMENT/ PLAN:  TODAY  1. Late onset alzheimer's disease with behavioral disturbance 2. Depression with anxiety 3, hypertensive heart and kidney disease with chronic diastolic congestive heart failure and stage 3b chronic kidney disease  Will continue current medications Will continue current plan of care Will continue to monitor her status.   MD is aware of resident's narcotic use and is in agreement with current plan of care. We will attempt to wean resident as appropriate.  Ok Edwards NP [facility]  Contact [phone] Monday through Friday 8am- 5pm  After hours call [phone]

## 2020-05-15 DIAGNOSIS — F411 Generalized anxiety disorder: Secondary | ICD-10-CM | POA: Diagnosis not present

## 2020-05-18 DIAGNOSIS — Z1159 Encounter for screening for other viral diseases: Secondary | ICD-10-CM | POA: Diagnosis not present

## 2020-05-18 DIAGNOSIS — I130 Hypertensive heart and chronic kidney disease with heart failure and stage 1 through stage 4 chronic kidney disease, or unspecified chronic kidney disease: Secondary | ICD-10-CM | POA: Diagnosis not present

## 2020-05-18 DIAGNOSIS — M79601 Pain in right arm: Secondary | ICD-10-CM | POA: Diagnosis not present

## 2020-05-20 DIAGNOSIS — F411 Generalized anxiety disorder: Secondary | ICD-10-CM | POA: Diagnosis not present

## 2020-05-21 ENCOUNTER — Non-Acute Institutional Stay (SKILLED_NURSING_FACILITY): Payer: Medicare Other | Admitting: Adult Health

## 2020-05-21 ENCOUNTER — Encounter: Payer: Self-pay | Admitting: Adult Health

## 2020-05-21 ENCOUNTER — Other Ambulatory Visit: Payer: Self-pay | Admitting: Adult Health

## 2020-05-21 DIAGNOSIS — F0281 Dementia in other diseases classified elsewhere with behavioral disturbance: Secondary | ICD-10-CM | POA: Diagnosis not present

## 2020-05-21 DIAGNOSIS — G301 Alzheimer's disease with late onset: Secondary | ICD-10-CM

## 2020-05-21 DIAGNOSIS — M79601 Pain in right arm: Secondary | ICD-10-CM | POA: Diagnosis not present

## 2020-05-21 DIAGNOSIS — F418 Other specified anxiety disorders: Secondary | ICD-10-CM | POA: Diagnosis not present

## 2020-05-21 DIAGNOSIS — Z1159 Encounter for screening for other viral diseases: Secondary | ICD-10-CM | POA: Diagnosis not present

## 2020-05-21 DIAGNOSIS — F02818 Dementia in other diseases classified elsewhere, unspecified severity, with other behavioral disturbance: Secondary | ICD-10-CM

## 2020-05-21 DIAGNOSIS — I130 Hypertensive heart and chronic kidney disease with heart failure and stage 1 through stage 4 chronic kidney disease, or unspecified chronic kidney disease: Secondary | ICD-10-CM | POA: Diagnosis not present

## 2020-05-21 MED ORDER — LORAZEPAM 0.5 MG PO TABS: 0.2500 mg | ORAL_TABLET | Freq: Two times a day (BID) | ORAL | 0 refills | Status: DC

## 2020-05-21 NOTE — Progress Notes (Signed)
Location:    [place] Room Number: 122/D Place of Service:  SNF (31)   CODE STATUS: DNR  Allergies  Allergen Reactions  . Bee Venom Anaphylaxis  . Ace Inhibitors   . Angiotensin Receptor Blockers     Tongue swelling.  . Aspirin     Directed by physician  . Cabbage Other (See Comments)    Certain foods due to gout flares  . Latex Rash    Chief Complaint  Patient presents with  . Acute Visit    Anxiety    HPI:  She is presently taking ativan 0.25 mg daily to help manage her anxiety. She continues to struggle with her anxiety. She is declining nursing care at times and is striking out at staff as well. Her family feels as though her ativan is too low and she would benefit from a more frequent dosing. She is presently taking celexa 30 mg daily as well and has failed a dose reduction in the past.   Past Medical History:  Diagnosis Date  . Anxiety   . Cellulitis 2016  . CHF (congestive heart failure) ([place])   . Chronic kidney disease   . Dementia ([place])   . Dysphagia   . Gait abnormality   . GERD (gastroesophageal reflux disease)   . Glaucoma   . Gout, unspecified 01/12/2010   Qualifier: Diagnosis of  By: Moshe Cipro MD, Joycelyn Schmid    . Hyperlipidemia   . Hypertension   . Muscle weakness   . Myocardial infarction [facility] [place]  . Osteoarthritis   . Syncope     Past Surgical History:  Procedure Laterality Date  . APPENDECTOMY    . CATARACT EXTRACTION W/PHACO  08/04/2011   Procedure: CATARACT EXTRACTION PHACO AND INTRAOCULAR LENS PLACEMENT ([facility]);  Surgeon: Tonny Branch;  Location: [facility];  Service: Ophthalmology;  Laterality: Right;  CDE=18.53  . CATARACT EXTRACTION W/PHACO  08/25/2011   Procedure: CATARACT EXTRACTION PHACO AND INTRAOCULAR LENS PLACEMENT ([facility]);  Surgeon: Tonny Branch;  Location: [facility];  Service: Ophthalmology;  Laterality: Left;  CDE:14.76  . TONSILLECTOMY      Social History   Socioeconomic History  . Marital status:  Divorced    Spouse name: Not on file  . Number of children: Not on file  . Years of education: Not on file  . Highest education level: Not on file  Occupational History  . Occupation: retired   Tobacco Use  . Smoking status: Never Smoker  . Smokeless tobacco: Never Used  Vaping Use  . Vaping Use: Never used  Substance and Sexual Activity  . Alcohol use: No  . Drug use: No  . Sexual activity: Not Currently  Other Topics Concern  . Not on file  Social History Narrative   Long term resident [facility]    Social Determinants of Health   Financial Resource Strain:   . Difficulty of Paying Living Expenses: Not on file  Food Insecurity:   . Worried About Charity fundraiser in the Last Year: Not on file  . Ran Out of Food in the Last Year: Not on file  Transportation Needs:   . Lack of Transportation (Medical): Not on file  . Lack of Transportation (Non-Medical): Not on file  Physical Activity:   . Days of Exercise per Week: Not on file  . Minutes of Exercise per Session: Not on file  Stress:   . Feeling of Stress : Not on file  Social Connections:   .  Frequency of Communication with Friends and Family: Not on file  . Frequency of Social Gatherings with Friends and Family: Not on file  . Attends Religious Services: Not on file  . Active Member of Clubs or Organizations: Not on file  . Attends Archivist Meetings: Not on file  . Marital Status: Not on file  Intimate Partner Violence:   . Fear of Current or Ex-Partner: Not on file  . Emotionally Abused: Not on file  . Physically Abused: Not on file  . Sexually Abused: Not on file   Family History  Problem Relation Age of Onset  . Diabetes Father   . Diabetes Sister   . Diabetes Brother   . Stroke Sister   . Stroke Sister   . Anesthesia problems Neg Hx   . Hypotension Neg Hx   . Malignant hyperthermia Neg Hx   . Pseudochol deficiency Neg Hx       VITAL SIGNS Pulse (!) 55   Temp 98.8 F (37.1 C)   Resp 18    Ht 4\' 11"  (1.499 m)   Wt 126 lb 3.2 oz (57.2 kg)   SpO2 95%   BMI 25.49 kg/m   Outpatient Encounter Medications as of 05/21/2020  Medication Sig  . acetaminophen (TYLENOL) 500 MG tablet Take 1,000 mg by mouth every 8 (eight) hours as needed. For leg pain and arthritis. May have additional 1000 mg for break through pain once a day prn  . alum & mag hydroxide-simeth (MAALOX PLUS) 400-400-40 MG/5ML suspension Amt: 4 teaspoons; oral Special Instructions: for indigestion/heartburn Twice A Day - PRN PRN 1, PRN 2  . Alum & Mag Hydroxide-Simeth (MYLANTA PO) Take by mouth. liquid; regular; amt: 4 tsp.; oral Special Instructions: after breakfast Once A Day 09:00 AM  . Balsam [place]-Castor Oil (VENELEX) OINT Apply topically to sacrum and bilateral buttocks every shift and as needed  . Brinzolamide-Brimonidine (SIMBRINZA) 1-0.2 % SUSP One  drop into both eyes three times a day   . Calcium Carbonate-Vitamin D (CALCIUM-VITAMIN D) 500-200 MG-UNIT tablet Take 1 tablet by mouth 2 (two) times daily.  . citalopram (CELEXA) 10 MG tablet Take 30 mg by mouth daily.   . cycloSPORINE (RESTASIS) 0.05 % ophthalmic emulsion Place 1 drop into both eyes 2 (two) times daily.   Marland Kitchen Dextromethorphan-guaiFENesin (ROBITUSSIN DM PO) 15 ml by mouth every 6 hours for cough/congestion. May give up to 48 hrs. Inform physician immediately if cough or congestion is associated with fever or SOB Every 6 Hours - PRN  . docusate sodium (COLACE) 100 MG capsule Take 100 mg by mouth daily.   Marland Kitchen donepezil (ARICEPT) 5 MG tablet Take 5 mg by mouth daily. For dementia  . furosemide (LASIX) 20 MG tablet Take 40 mg by mouth 2 (two) times daily.   . hydrALAZINE (APRESOLINE) 50 MG tablet Take 50 mg by mouth 2 (two) times a day.   . ipratropium-albuterol (DUONEB) 0.5-2.5 (3) MG/3ML SOLN Take 3 mLs by nebulization every 6 (six) hours as needed.  . latanoprost (XALATAN) 0.005 % ophthalmic solution Place 1 drop into both eyes at bedtime.  Marland Kitchen  loratadine (CLARITIN) 10 MG tablet Take 10 mg by mouth daily.  Marland Kitchen LORazepam (ATIVAN) 0.5 MG tablet Take 0.5 tablets (0.25 mg total) by mouth daily.  . nitroGLYCERIN (NITROSTAT) 0.4 MG SL tablet Place 0.4 mg under the tongue every 5 (five) minutes as needed for chest pain.   . NON FORMULARY Diet Type:  Regular  . OXYGEN Inhale 2  L/min into the lungs continuous. Wean to keep sats above 90%  . pantoprazole (PROTONIX) 40 MG tablet Take 40 mg by mouth daily. Do not crush.   . potassium chloride (K-DUR,KLOR-CON) 10 MEQ tablet Take 20 mEq by mouth 2 (two) times daily.    No facility-administered encounter medications on file as of 05/21/2020.     SIGNIFICANT DIAGNOSTIC EXAMS   PREVIOUS ;   07-22-19: right forearm x-ray  1. Impacted fracture of the radial neck junction. 2. Suspect additional nondisplaced fracture of the coronoid process. 3. Right oval swelling and trace joint effusion. 4. Degenerative arthrosis of the wrist and elbow. Enthesopathic changes of the olecranon.  07-22-19: right shoulder x-ray  1. The osseous structures appear diffusely demineralized which may limit detection of small or nondisplaced fractures. 2. No acute fracture or traumatic malalignment. 3. High-riding appearance of the humeral head likely reflecting rotator cuff insufficiency. 4. Moderate osteoarthrosis of the acromioclavicular and glenohumeral joints.  03-21-20: chest x-ray: Mild congestive heart failure   NO NEW EXAMS.   LABS REVIEWED PREVIOUS:    09-06-19: wbc 4.4; hgb 10.8; hct 34.9; mcv 84.7 plt 261; glucose 81; bun 26; creat 1.21; k+ 3.5; na++ 141; ca 9.1 liver normal albumin 3.5    02-24-20: wbc 4.9; hgb 9.5; hct 30.0 mcv 82.2 plt 160; glucose 82; bun 49; creat 1.54; k+ 3.4; na++ 140; ca 8.7 liver normal albumin 3.4 02-27-20: glucose 84; bun 41; creat 1.29; k+ 3.9; na++ 142; ca 9.0  03-12-20: glucose 87; bun 45; creat 1.26; k+ 3.7; na++ 141; ca 9.0 03-18-20: tsh 1.548 vit B 12: 529 03-30-20: glucose 88;  bun 26; creat 1.33; k+ 2.9; na++ 140 ca 9.0  03-31-20: k+ 3.6  NO NEW LABS.  Review of Systems  Constitutional: Negative for malaise/fatigue.  Respiratory: Negative for cough and shortness of breath.   Cardiovascular: Negative for chest pain, palpitations and leg swelling.  Gastrointestinal: Negative for abdominal pain, constipation and heartburn.  Musculoskeletal: Negative for back pain, joint pain and myalgias.  Skin: Negative.   Neurological: Negative for dizziness.  Psychiatric/Behavioral: The patient is not nervous/anxious.     Physical Exam Constitutional:      General: She is not in acute distress.    Appearance: She is well-developed. She is not diaphoretic.  Neck:     Thyroid: No thyromegaly.  Cardiovascular:     Rate and Rhythm: Normal rate and regular rhythm.     Pulses: Normal pulses.     Heart sounds: Normal heart sounds.  Pulmonary:     Effort: Pulmonary effort is normal. No respiratory distress.     Breath sounds: Normal breath sounds.  Abdominal:     General: Bowel sounds are normal. There is no distension.     Palpations: Abdomen is soft.     Tenderness: There is no abdominal tenderness.  Musculoskeletal:        General: Normal range of motion.     Cervical back: Neck supple.     Right lower leg: Edema present.     Left lower leg: Edema present.     Comments: Trace bilateral lower extremity edema   Lymphadenopathy:     Cervical: No cervical adenopathy.  Skin:    General: Skin is warm and dry.     Comments: Bilateral lower extremities discolored    Neurological:     Mental Status: She is alert. Mental status is at baseline.  Psychiatric:        Mood and Affect: Mood normal.  ASSESSMENT/ PLAN:  TODAY  1. Late onset alzheimer's disease with behavioral disturbance 2. Depression with anxiety  Her status is worse Will continue celexa 30 mg daily  Will increase to ativan 0.25 mg twice daily  Will monitor her status.   MD is aware of  resident's narcotic use and is in agreement with current plan of care. We will attempt to wean resident as appropriate.  Ok Edwards NP [facility]  Contact [phone] Monday through Friday 8am- 5pm  After hours call [phone]

## 2020-05-27 DIAGNOSIS — F411 Generalized anxiety disorder: Secondary | ICD-10-CM | POA: Diagnosis not present

## 2020-06-03 ENCOUNTER — Other Ambulatory Visit (INDEPENDENT_AMBULATORY_CARE_PROVIDER_SITE_OTHER): Payer: Self-pay

## 2020-06-03 ENCOUNTER — Other Ambulatory Visit: Payer: Self-pay | Admitting: Adult Health

## 2020-06-04 ENCOUNTER — Non-Acute Institutional Stay (SKILLED_NURSING_FACILITY): Payer: Medicare Other | Admitting: Adult Health

## 2020-06-04 ENCOUNTER — Encounter: Payer: Self-pay | Admitting: Adult Health

## 2020-06-04 DIAGNOSIS — K219 Gastro-esophageal reflux disease without esophagitis: Secondary | ICD-10-CM

## 2020-06-04 DIAGNOSIS — J3089 Other allergic rhinitis: Secondary | ICD-10-CM

## 2020-06-04 DIAGNOSIS — G301 Alzheimer's disease with late onset: Secondary | ICD-10-CM

## 2020-06-04 DIAGNOSIS — F418 Other specified anxiety disorders: Secondary | ICD-10-CM | POA: Diagnosis not present

## 2020-06-04 DIAGNOSIS — I5032 Chronic diastolic (congestive) heart failure: Secondary | ICD-10-CM

## 2020-06-04 DIAGNOSIS — J9611 Chronic respiratory failure with hypoxia: Secondary | ICD-10-CM

## 2020-06-04 DIAGNOSIS — I130 Hypertensive heart and chronic kidney disease with heart failure and stage 1 through stage 4 chronic kidney disease, or unspecified chronic kidney disease: Secondary | ICD-10-CM

## 2020-06-04 DIAGNOSIS — I82402 Acute embolism and thrombosis of unspecified deep veins of left lower extremity: Secondary | ICD-10-CM

## 2020-06-04 DIAGNOSIS — K5909 Other constipation: Secondary | ICD-10-CM | POA: Diagnosis not present

## 2020-06-04 DIAGNOSIS — Z9981 Dependence on supplemental oxygen: Secondary | ICD-10-CM

## 2020-06-04 DIAGNOSIS — M170 Bilateral primary osteoarthritis of knee: Secondary | ICD-10-CM

## 2020-06-04 DIAGNOSIS — N1832 Chronic kidney disease, stage 3b: Secondary | ICD-10-CM | POA: Diagnosis not present

## 2020-06-04 DIAGNOSIS — F411 Generalized anxiety disorder: Secondary | ICD-10-CM | POA: Diagnosis not present

## 2020-06-04 DIAGNOSIS — H40053 Ocular hypertension, bilateral: Secondary | ICD-10-CM | POA: Diagnosis not present

## 2020-06-04 DIAGNOSIS — F0281 Dementia in other diseases classified elsewhere with behavioral disturbance: Secondary | ICD-10-CM

## 2020-06-04 NOTE — Progress Notes (Signed)
Provider:Saya Mccoll, Phylis Bougie, NP Location   [place]  PCP: Gerlene Fee, NP   Extended Emergency Contact Information Primary Emergency Contact: [name], [place] [address] [name] of [place] Phone: [phone] Relation: Daughter Secondary Emergency Contact: [name] [place] of [place] Phone: [phone] Relation: Son  Codes status: DNR Goals of care: advanced directive information Advanced Directives 06/04/2020  Does Patient Have a Medical Advance Directive? Yes  Type of Advance Directive Out of facility DNR (pink MOST or yellow form)  Does patient want to make changes to medical advance directive? No - Patient declined  Copy of [place] in Chart? -  Would patient like information on creating a medical advance directive? -  Pre-existing out of facility DNR order (yellow form or pink MOST form) Yellow form placed in chart (order not valid for inpatient use)     Allergies  Allergen Reactions  . Bee Venom Anaphylaxis  . Ace Inhibitors   . Angiotensin Receptor Blockers     Tongue swelling.  . Aspirin     Directed by physician  . Cabbage Other (See Comments)    Certain foods due to gout flares  . Latex Rash    Chief Complaint  Patient presents with  . Annual Exam    Annual Exam    HPI  She is a 86 year old long term resident of this facility being seen for her annual visit. She has not been hospitalized over the past year. She is now on chronic diuretics. She responded well with a significant weight loss. She has not had any recent falls. She denies any pain; she has a good appetite; denies any anxiety denies any insomnia. She continues to be followed for her chronic illnesses including: Chronic diastolic CHF (Congestive heart failure)  Deep vein thrombosis (DV) of left lower extremity unspecified chronicity unspecified vein Chronic non-seasonal allergic rhinitis:     Past Medical  History:  Diagnosis Date  . Anxiety   . Cellulitis 2016  . CHF (congestive heart failure) ([place])   . Chronic kidney disease   . Dementia ([place])   . Dysphagia   . Gait abnormality   . GERD (gastroesophageal reflux disease)   . Glaucoma   . Gout, unspecified 01/12/2010   Qualifier: Diagnosis of  By: Moshe Cipro MD, Joycelyn Schmid    . Hyperlipidemia   . Hypertension   . Muscle weakness   . Myocardial infarction [facility]) [place]  . Osteoarthritis   . Syncope    Past Surgical History:  Procedure Laterality Date  . APPENDECTOMY    . CATARACT EXTRACTION W/PHACO  08/04/2011   Procedure: CATARACT EXTRACTION PHACO AND INTRAOCULAR LENS PLACEMENT (IOC);  Surgeon: Tonny Branch;  Location: [facility];  Service: Ophthalmology;  Laterality: Right;  CDE=18.53  . CATARACT EXTRACTION W/PHACO  08/25/2011   Procedure: CATARACT EXTRACTION PHACO AND INTRAOCULAR LENS PLACEMENT (IOC);  Surgeon: Tonny Branch;  Location: [facility];  Service: Ophthalmology;  Laterality: Left;  CDE:14.76  . TONSILLECTOMY      reports that she has never smoked. She has never used smokeless tobacco. She reports that she does not drink alcohol and does not use drugs. Social History   Tobacco Use  . Smoking status: Never Smoker  . Smokeless tobacco: Never Used  Vaping Use  . Vaping Use: Never used  Substance Use Topics  . Alcohol use: No  . Drug use: No  Family History  Problem Relation Age of Onset  . Diabetes Father   . Diabetes Sister   . Diabetes Brother   . Stroke Sister   . Stroke Sister   . Anesthesia problems Neg Hx   . Hypotension Neg Hx   . Malignant hyperthermia Neg Hx   . Pseudochol deficiency Neg Hx     Pertinent  Health Maintenance Due  Topic Date Due  . INFLUENZA VACCINE  03/29/2020  . DEXA SCAN  Completed  . PNA vac Low Risk Adult  Completed   Fall Risk  12/20/2019 07/24/2019 06/09/2019 05/31/2018 05/22/2017  Falls in the past year? 1 1 0 No No  Number falls in past yr: 0 1 - - -  Injury with Fall? 1 1 - -  -  Risk for fall due to : History of fall(s);Impaired balance/gait;Impaired mobility History of fall(s);Impaired balance/gait;Impaired mobility - - -  Follow up Falls evaluation completed Falls evaluation completed - - -   Depression screen [facility] 2/9 12/20/2019 07/24/2019 06/09/2019 05/31/2018 05/22/2017  Decreased Interest 0 0 0 0 0  Down, Depressed, Hopeless 0 0 0 0 0  PHQ - 2 Score 0 0 0 0 0  Some recent data might be hidden    Functional Status Survey:    Outpatient Encounter Medications as of 06/04/2020  Medication Sig  . acetaminophen (TYLENOL) 500 MG tablet Take 1,000 mg by mouth every 8 (eight) hours as needed. For leg pain and arthritis. May have additional 1000 mg for break through pain once a day prn  . alum & mag hydroxide-simeth (MAALOX PLUS) 400-400-40 MG/5ML suspension Amt: 4 teaspoons; oral Special Instructions: for indigestion/heartburn Twice A Day - PRN PRN 1, PRN 2  . Alum & Mag Hydroxide-Simeth (MYLANTA PO) Take by mouth. liquid; regular; amt: 4 tsp.; oral Special Instructions: after breakfast Once A Day 09:00 AM  . Balsam [place]-Castor Oil (VENELEX) OINT Apply topically to sacrum and bilateral buttocks every shift and as needed  . Brinzolamide-Brimonidine (SIMBRINZA) 1-0.2 % SUSP One  drop into both eyes three times a day   . Calcium Carbonate-Vitamin D (CALCIUM-VITAMIN D) 500-200 MG-UNIT tablet Take 1 tablet by mouth 2 (two) times daily.  . citalopram (CELEXA) 10 MG tablet Take 30 mg by mouth daily.   . cycloSPORINE (RESTASIS) 0.05 % ophthalmic emulsion Place 1 drop into both eyes 2 (two) times daily.   Marland Kitchen Dextromethorphan-guaiFENesin (ROBITUSSIN DM PO) 15 ml by mouth every 6 hours for cough/congestion. May give up to 48 hrs. Inform physician immediately if cough or congestion is associated with fever or SOB Every 6 Hours - PRN  . docusate sodium (COLACE) 100 MG capsule Take 100 mg by mouth daily.   Marland Kitchen donepezil (ARICEPT) 5 MG tablet Take 5 mg by mouth daily. For dementia   . furosemide (LASIX) 20 MG tablet Take 40 mg by mouth 2 (two) times daily.   . hydrALAZINE (APRESOLINE) 50 MG tablet Take 50 mg by mouth 2 (two) times a day.   . ipratropium-albuterol (DUONEB) 0.5-2.5 (3) MG/3ML SOLN Take 3 mLs by nebulization every 6 (six) hours as needed.  . latanoprost (XALATAN) 0.005 % ophthalmic solution Place 1 drop into both eyes at bedtime.  Marland Kitchen loratadine (CLARITIN) 10 MG tablet Take 10 mg by mouth daily.  Marland Kitchen LORazepam (ATIVAN) 0.5 MG tablet Take 0.5 tablets (0.25 mg total) by mouth 2 (two) times daily.  . nitroGLYCERIN (NITROSTAT) 0.4 MG SL tablet Place 0.4 mg under the tongue every 5 (five) minutes as  needed for chest pain.   . NON FORMULARY Diet Type:  Regular  . OXYGEN Inhale 2 L/min into the lungs continuous. Wean to keep sats above 90%  . pantoprazole (PROTONIX) 40 MG tablet Take 40 mg by mouth daily. Do not crush.   . potassium chloride (K-DUR,KLOR-CON) 10 MEQ tablet Take 20 mEq by mouth 2 (two) times daily.    No facility-administered encounter medications on file as of 06/04/2020.     Vitals:   06/04/20 1019  BP: (!) 145/63  Pulse: (!) 59  Resp: 16  Temp: 98.2 F (36.8 C)  SpO2: 96%  Weight: 126 lb 12.8 oz (57.5 kg)  Height: 4\' 11"  (1.499 m)   Body mass index is 25.61 kg/m.  DIAGNOSTIC EXAMS   PREVIOUS ;   07-22-19: right forearm x-ray  1. Impacted fracture of the radial neck junction. 2. Suspect additional nondisplaced fracture of the coronoid process. 3. Right oval swelling and trace joint effusion. 4. Degenerative arthrosis of the wrist and elbow. Enthesopathic changes of the olecranon.  07-22-19: right shoulder x-ray  1. The osseous structures appear diffusely demineralized which may limit detection of small or nondisplaced fractures. 2. No acute fracture or traumatic malalignment. 3. High-riding appearance of the humeral head likely reflecting rotator cuff insufficiency. 4. Moderate osteoarthrosis of the acromioclavicular and  glenohumeral joints.  03-21-20: chest x-ray: Mild congestive heart failure   NO NEW EXAMS.   LABS REVIEWED PREVIOUS:    09-06-19: wbc 4.4; hgb 10.8; hct 34.9; mcv 84.7 plt 261; glucose 81; bun 26; creat 1.21; k+ 3.5; na++ 141; ca 9.1 liver normal albumin 3.5    02-24-20: wbc 4.9; hgb 9.5; hct 30.0 mcv 82.2 plt 160; glucose 82; bun 49; creat 1.54; k+ 3.4; na++ 140; ca 8.7 liver normal albumin 3.4 02-27-20: glucose 84; bun 41; creat 1.29; k+ 3.9; na++ 142; ca 9.0  03-12-20: glucose 87; bun 45; creat 1.26; k+ 3.7; na++ 141; ca 9.0 03-18-20: tsh 1.548 vit B 12: 529 03-30-20: glucose 88; bun 26; creat 1.33; k+ 2.9; na++ 140 ca 9.0  03-31-20: k+ 3.6  NO NEW LABS.   Review of Systems  Constitutional: Negative for malaise/fatigue.  Respiratory: Negative for cough and shortness of breath.   Cardiovascular: Negative for chest pain, palpitations and leg swelling.  Gastrointestinal: Negative for abdominal pain, constipation and heartburn.  Musculoskeletal: Negative for back pain, joint pain and myalgias.  Skin: Negative.   Neurological: Negative for dizziness.  Psychiatric/Behavioral: The patient is not nervous/anxious.     Physical Exam Constitutional:      General: She is not in acute distress.    Appearance: She is well-developed. She is not diaphoretic.  HENT:     Right Ear: Tympanic membrane normal.     Left Ear: Tympanic membrane normal.     Nose: Nose normal.     Mouth/Throat:     Mouth: Mucous membranes are moist.     Pharynx: Oropharynx is clear.  Eyes:     Conjunctiva/sclera: Conjunctivae normal.  Neck:     Thyroid: No thyromegaly.  Cardiovascular:     Rate and Rhythm: Normal rate and regular rhythm.     Pulses: Normal pulses.     Heart sounds: Normal heart sounds.  Pulmonary:     Effort: Pulmonary effort is normal. No respiratory distress.     Breath sounds: Normal breath sounds.  Abdominal:     General: Bowel sounds are normal. There is no distension.     Palpations: Abdomen  is  soft.     Tenderness: There is no abdominal tenderness.  Musculoskeletal:        General: Normal range of motion.     Cervical back: Neck supple.     Right lower leg: Edema present.     Left lower leg: Edema present.     Comments: Trace bilateral lower extremity   Lymphadenopathy:     Cervical: No cervical adenopathy.  Skin:    General: Skin is warm and dry.     Comments:  Bilateral lower extremities discolored  Neurological:     Mental Status: She is alert. Mental status is at baseline.  Psychiatric:        Mood and Affect: Mood normal.      ASSESSMENT/ PLAN:  TODAY:   1. CKD (chronic kidney disease) stage 3 GFR 30-59 ml/min is stable bun 27; creat 1.09  2. Chronic constipation: is stable will continue colace twice daily   3. Depression with anxiety is stable will continue celexa 30 mg daily; ativan 0.25 mg twice daily has failed 2 weans from ativan one from celexa  will monitor   4. Chronic diastolic CHF (Congestive heart failure) EF 65-70% (03-04-18) is without change: will continue apresoline 50 mg twice daily lasix 20 mg daily has prn ntg  5. Deep vein thrombosis (DV) of left lower extremity unspecified chronicity unspecified veing: is stable will continue to monitor her status.   6. Chronic non-seasonal allergic rhinitis: is stable will continue claritin 10 mg daily   7. Hypertensive heart and kidney disease with chronic diastolic congestive heart failure and stage 2 chronic kidney disease is stable b/p 145/63 will continue apresoline 50 mg twice daily and lasix 20 mg daily  8. GERD without esophagitis: is stable will continue protonix 40 mg daily   9. Chronic respiratory failure with hypoxia on home 02 therapy: is stable will continue claritin 10 mg daily and is on 02  10. Increased intraocular pressure bilateral is stable will continue simbrinza three times daily travatan nightly to both eyes.   11. Hypokalemia: is stable will continue k+ 10 meq daily   12.  Vascular dementia with behavioral disturbance unspecified dementia type; is without change weight is 126 pounds albumin 3.9 will continue aricept 5 mg daily   13. Primary osteoarthritis multiple joints is stable will continue tylenol 1 gm twice daily       Ok Edwards NP [facility]  Contact [phone] Monday through Friday 8am- 5pm  After hours call [phone]

## 2020-06-06 DIAGNOSIS — Z1159 Encounter for screening for other viral diseases: Secondary | ICD-10-CM | POA: Diagnosis not present

## 2020-06-06 DIAGNOSIS — M79601 Pain in right arm: Secondary | ICD-10-CM | POA: Diagnosis not present

## 2020-06-06 DIAGNOSIS — I130 Hypertensive heart and chronic kidney disease with heart failure and stage 1 through stage 4 chronic kidney disease, or unspecified chronic kidney disease: Secondary | ICD-10-CM | POA: Diagnosis not present

## 2020-06-10 ENCOUNTER — Encounter: Payer: Self-pay | Admitting: Adult Health

## 2020-06-12 DIAGNOSIS — F411 Generalized anxiety disorder: Secondary | ICD-10-CM | POA: Diagnosis not present

## 2020-06-17 ENCOUNTER — Other Ambulatory Visit: Payer: Self-pay | Admitting: Adult Health

## 2020-06-17 MED ORDER — LORAZEPAM 0.5 MG PO TABS: 0.2500 mg | ORAL_TABLET | Freq: Two times a day (BID) | ORAL | 0 refills | Status: DC

## 2020-06-18 DIAGNOSIS — F411 Generalized anxiety disorder: Secondary | ICD-10-CM | POA: Diagnosis not present

## 2020-06-25 DIAGNOSIS — F411 Generalized anxiety disorder: Secondary | ICD-10-CM | POA: Diagnosis not present

## 2020-07-02 DIAGNOSIS — F411 Generalized anxiety disorder: Secondary | ICD-10-CM | POA: Diagnosis not present

## 2020-07-07 ENCOUNTER — Non-Acute Institutional Stay (SKILLED_NURSING_FACILITY): Payer: Medicare Other | Admitting: Adult Health

## 2020-07-07 ENCOUNTER — Encounter: Payer: Self-pay | Admitting: Adult Health

## 2020-07-07 DIAGNOSIS — Z66 Do not resuscitate: Secondary | ICD-10-CM | POA: Diagnosis not present

## 2020-07-07 DIAGNOSIS — G301 Alzheimer's disease with late onset: Secondary | ICD-10-CM

## 2020-07-07 DIAGNOSIS — F323 Major depressive disorder, single episode, severe with psychotic features: Secondary | ICD-10-CM

## 2020-07-07 DIAGNOSIS — F02818 Dementia in other diseases classified elsewhere, unspecified severity, with other behavioral disturbance: Secondary | ICD-10-CM

## 2020-07-07 DIAGNOSIS — F0281 Dementia in other diseases classified elsewhere with behavioral disturbance: Secondary | ICD-10-CM | POA: Diagnosis not present

## 2020-07-07 NOTE — Progress Notes (Signed)
Location:    [facility] Room Number: 122-D Place of Service:  SNF (31)   CODE STATUS: DNR  Allergies  Allergen Reactions   Bee Venom Anaphylaxis   Ace Inhibitors    Angiotensin Receptor Blockers     Tongue swelling.   Aspirin     Directed by physician   Cabbage Other (See Comments)    Certain foods due to gout flares   Latex Rash    Chief Complaint  Patient presents with   Medical Management of Chronic Issues             Late onset alzheimer's disease with behavioral disturbance:    Major depression with psychotic features  CKD (chronic kidney disease) stage 3 GFR 30-59 ml/min    HPI:  She is a 85 year old long term resident of this facility being seen for the management of her chronic illnesses: Late onset alzheimer's disease with behavioral disturbance:    Major depression with psychotic features  CKD (chronic kidney disease) stage 3 GFR 30-59 ml/min. She is having increased hallucinations; delusions which are causing her emotional harm with anxiety and fear. She told me that she does not have any family they are all dead. There are no reports of uncontrolled pain; no reports of changes in appetite.   Past Medical History:  Diagnosis Date   Anxiety    Cellulitis 2016   CHF (congestive heart failure) (H[facility])    Chronic kidney disease    Dementia ([facility])    Dysphagia    Fracture of radial neck, right, closed 08/06/2019   Gait abnormality    GERD (gastroesophageal reflux disease)    Glaucoma    Gout, unspecified 01/12/2010   Qualifier: Diagnosis of  By: Moshe Cipro MD, Margaret     Hyperlipidemia    Hypertension    Muscle weakness    Myocardial infarction ([place]) 1990s   [place]   Osteoarthritis    Syncope     Past Surgical History:  Procedure Laterality Date   APPENDECTOMY     CATARACT EXTRACTION W/PHACO  08/04/2011   Procedure: CATARACT EXTRACTION PHACO AND INTRAOCULAR LENS PLACEMENT ([place]);  Surgeon: Tonny Branch;   Location: [facility];  Service: Ophthalmology;  Laterality: Right;  CDE=18.53   CATARACT EXTRACTION W/PHACO  08/25/2011   Procedure: CATARACT EXTRACTION PHACO AND INTRAOCULAR LENS PLACEMENT (IOC);  Surgeon: Tonny Branch;  Location: [facility];  Service: Ophthalmology;  Laterality: Left;  CDE:14.76   TONSILLECTOMY      Social History   Socioeconomic History   Marital status: Divorced    Spouse name: Not on file   Number of children: Not on file   Years of education: Not on file   Highest education level: Not on file  Occupational History   Occupation: retired   Tobacco Use   Smoking status: Never Smoker   Smokeless tobacco: Never Used  Scientific laboratory technician Use: Never used  Substance and Sexual Activity   Alcohol use: No   Drug use: No   Sexual activity: Not Currently  Other Topics Concern   Not on file  Social History Narrative   Long term resident [facility]    Social Determinants of Health   Financial Resource Strain:    Difficulty of Paying Living Expenses: Not on file  Food Insecurity:    Worried About Charity fundraiser in the Last Year: Not on file   [employer] of Food in the Last Year: Not on file  Transportation Needs:    Film/video editor (Medical): Not on file   Lack of Transportation (Non-Medical): Not on file  Physical Activity:    Days of Exercise per Week: Not on file   Minutes of Exercise per Session: Not on file  Stress:    Feeling of Stress : Not on file  Social Connections:    Frequency of Communication with Friends and Family: Not on file   Frequency of Social Gatherings with Friends and Family: Not on file   Attends Religious Services: Not on file   Active Member of Clubs or Organizations: Not on file   Attends Archivist Meetings: Not on file   Marital Status: Not on file  Intimate Partner Violence:    Fear of Current or Ex-Partner: Not on file   Emotionally Abused: Not on file   Physically Abused: Not on file    Sexually Abused: Not on file   Family History  Problem Relation Age of Onset   Diabetes Father    Diabetes Sister    Diabetes Brother    Stroke Sister    Stroke Sister    Anesthesia problems Neg Hx    Hypotension Neg Hx    Malignant hyperthermia Neg Hx    Pseudochol deficiency Neg Hx       VITAL SIGNS BP 130/63    Pulse 68    Temp (!) 97.2 F (36.2 C)    Resp 17    Ht 4\' 11"  (1.499 m)    Wt 128 lb 9.6 oz (58.3 kg)    SpO2 97%    BMI 25.97 kg/m   Outpatient Encounter Medications as of 07/07/2020  Medication Sig   acetaminophen (TYLENOL) 500 MG tablet Take 1,000 mg by mouth every 8 (eight) hours as needed. For leg pain and arthritis. May have additional 1000 mg for break through pain once a day prn   alum & mag hydroxide-simeth (MAALOX PLUS) 400-400-40 MG/5ML suspension Amt: 4 teaspoons; oral Special Instructions: for indigestion/heartburn Twice A Day - PRN PRN 1, PRN 2   Alum & Mag Hydroxide-Simeth (MYLANTA PO) Take by mouth. liquid; regular; amt: 4 tsp.; oral Special Instructions: after breakfast Once A Day 09:00 AM   Balsam [place]-Castor Oil (VENELEX) OINT Apply topically to sacrum and bilateral buttocks every shift and as needed   Brinzolamide-Brimonidine (SIMBRINZA) 1-0.2 % SUSP One  drop into both eyes three times a day    Calcium Carbonate-Vitamin D (CALCIUM-VITAMIN D) 500-200 MG-UNIT tablet Take 1 tablet by mouth 2 (two) times daily.   citalopram (CELEXA) 10 MG tablet Take 30 mg by mouth daily.    cycloSPORINE (RESTASIS) 0.05 % ophthalmic emulsion Place 1 drop into both eyes 2 (two) times daily.    Dextromethorphan-guaiFENesin (ROBITUSSIN DM PO) 15 ml by mouth every 6 hours for cough/congestion. May give up to 48 hrs. Inform physician immediately if cough or congestion is associated with fever or SOB Every 6 Hours - PRN   docusate sodium (COLACE) 100 MG capsule Take 100 mg by mouth daily.    donepezil (ARICEPT) 5 MG tablet Take 5 mg by mouth daily. For  dementia   furosemide (LASIX) 40 MG tablet Take 40 mg by mouth 2 (two) times daily.   hydrALAZINE (APRESOLINE) 50 MG tablet Take 50 mg by mouth 2 (two) times a day.    ipratropium-albuterol (DUONEB) 0.5-2.5 (3) MG/3ML SOLN Take 3 mLs by nebulization every 6 (six) hours as needed.   latanoprost (XALATAN) 0.005 % ophthalmic solution Place  1 drop into both eyes at bedtime.   loratadine (CLARITIN) 10 MG tablet Take 10 mg by mouth daily.   LORazepam (ATIVAN) 0.5 MG tablet Take 0.5 tablets (0.25 mg total) by mouth 2 (two) times daily.   nitroGLYCERIN (NITROSTAT) 0.4 MG SL tablet Place 0.4 mg under the tongue every 5 (five) minutes as needed for chest pain.    NON FORMULARY Diet Type:  Regular   OXYGEN Inhale 2 L/min into the lungs continuous. Wean to keep sats above 90%   pantoprazole (PROTONIX) 40 MG tablet Take 40 mg by mouth daily. Do not crush.    potassium chloride SA (KLOR-CON) 20 MEQ tablet Take 20 mEq by mouth 2 (two) times daily. DX hypokalemia   [DISCONTINUED] furosemide (LASIX) 20 MG tablet Take 40 mg by mouth 2 (two) times daily.    [DISCONTINUED] potassium chloride (K-DUR,KLOR-CON) 10 MEQ tablet Take 20 mEq by mouth 2 (two) times daily.    No facility-administered encounter medications on file as of 07/07/2020.     SIGNIFICANT DIAGNOSTIC EXAMS   PREVIOUS ;   07-22-19: right forearm x-ray  1. Impacted fracture of the radial neck junction. 2. Suspect additional nondisplaced fracture of the coronoid process. 3. Right oval swelling and trace joint effusion. 4. Degenerative arthrosis of the wrist and elbow. Enthesopathic changes of the olecranon.  07-22-19: right shoulder x-ray  1. The osseous structures appear diffusely demineralized which may limit detection of small or nondisplaced fractures. 2. No acute fracture or traumatic malalignment. 3. High-riding appearance of the humeral head likely reflecting rotator cuff insufficiency. 4. Moderate osteoarthrosis of the  acromioclavicular and glenohumeral joints.  03-21-20: chest x-ray: Mild congestive heart failure   NO NEW EXAMS.   LABS REVIEWED PREVIOUS:    09-06-19: wbc 4.4; hgb 10.8; hct 34.9; mcv 84.7 plt 261; glucose 81; bun 26; creat 1.21; k+ 3.5; na++ 141; ca 9.1 liver normal albumin 3.5    02-24-20: wbc 4.9; hgb 9.5; hct 30.0 mcv 82.2 plt 160; glucose 82; bun 49; creat 1.54; k+ 3.4; na++ 140; ca 8.7 liver normal albumin 3.4 02-27-20: glucose 84; bun 41; creat 1.29; k+ 3.9; na++ 142; ca 9.0  03-12-20: glucose 87; bun 45; creat 1.26; k+ 3.7; na++ 141; ca 9.0 03-18-20: tsh 1.548 vit B 12: 529 03-30-20: glucose 88; bun 26; creat 1.33; k+ 2.9; na++ 140 ca 9.0  03-31-20: k+ 3.6  NO NEW LABS.   Review of Systems  Constitutional: Negative for malaise/fatigue.  Respiratory: Negative for cough and shortness of breath.   Cardiovascular: Negative for chest pain, palpitations and leg swelling.  Gastrointestinal: Negative for abdominal pain, constipation and heartburn.  Musculoskeletal: Negative for back pain, joint pain and myalgias.  Skin: Negative.   Neurological: Negative for dizziness.  Psychiatric/Behavioral: The patient is nervous/anxious.        Feels frightened.  Is afraid that trump and his children are out to get her; that her daughter has been killed that she has no family    Physical Exam Constitutional:      General: She is not in acute distress.    Appearance: She is well-developed. She is not diaphoretic.  Neck:     Thyroid: No thyromegaly.  Cardiovascular:     Rate and Rhythm: Normal rate and regular rhythm.     Pulses: Normal pulses.     Heart sounds: Normal heart sounds.  Pulmonary:     Effort: Pulmonary effort is normal. No respiratory distress.     Breath sounds: Normal breath sounds.  Abdominal:     General: Bowel sounds are normal. There is no distension.     Palpations: Abdomen is soft.     Tenderness: There is no abdominal tenderness.  Musculoskeletal:        General: Normal  range of motion.     Cervical back: Neck supple.     Right lower leg: No edema.     Left lower leg: No edema.  Lymphadenopathy:     Cervical: No cervical adenopathy.  Skin:    General: Skin is warm and dry.     Comments: Bilateral lower extremities discolored   Neurological:     Mental Status: She is alert. Mental status is at baseline.  Psychiatric:     Comments: Is restless      ASSESSMENT/ PLAN:  TODAY:   1. Late onset alzheimer's disease with behavioral disturbance: is having increased behaviors with delusions and hallucinations which are disturbing to her. Will continue aricept 5 mg daily   2. Major depression with psychotic features is worse; will continue celexa 30 mg daily (has failed one wean from celexa)  ativan 0.25 mg twice daily (has failed 2 weans from ativan) will begin seroquel 12.5 mg nightly   3. CKD (chronic kidney disease) stage 3 GFR 30-59 ml/min is stable bun 27; creat 1.09.   PREVIOUS   4. Chronic constipation: is stable will continue colace twice daily   5. Chronic diastolic CHF (Congestive heart failure) EF 65-70% (03-04-18) is without change: will continue apresoline 50 mg twice daily lasix 20 mg daily has prn ntg  6. Deep vein thrombosis (DV) of left lower extremity unspecified chronicity unspecified veing: is stable will continue to monitor her status.   7. Chronic non-seasonal allergic rhinitis: is stable will continue claritin 10 mg daily   8. Hypertensive heart and kidney disease with chronic diastolic congestive heart failure and stage 2 chronic kidney disease is stable b/p 145/63 will continue apresoline 50 mg twice daily and lasix 20 mg daily  9. GERD without esophagitis: is stable will continue protonix 40 mg daily   10. Chronic respiratory failure with hypoxia on home 02 therapy: is stable will continue claritin 10 mg daily and is on 02  11. Increased intraocular pressure bilateral is stable will continue simbrinza three times daily travatan  nightly to both eyes.   12. Hypokalemia: is stable will continue k+ 10 meq daily   13. Primary osteoarthritis multiple joints is stable will continue tylenol 1 gm twice daily      Will get cbc; cmp; vit b12; tsh folate      MD is aware of resident's narcotic use and is in agreement with current plan of care. We will attempt to wean resident as appropriate.  Ok Edwards NP [facility]  Contact [phone] Monday through Friday 8am- 5pm  After hours call [phone]

## 2020-07-08 ENCOUNTER — Other Ambulatory Visit (HOSPITAL_COMMUNITY)
Admission: RE | Admit: 2020-07-08 | Discharge: 2020-07-08 | Disposition: A | Discharge status: Home / Self Care | Payer: Medicare Other | Source: Skilled Nursing Facility | Attending: Adult Health | Admitting: Adult Health

## 2020-07-08 DIAGNOSIS — I130 Hypertensive heart and chronic kidney disease with heart failure and stage 1 through stage 4 chronic kidney disease, or unspecified chronic kidney disease: Secondary | ICD-10-CM | POA: Diagnosis not present

## 2020-07-08 LAB — COMPREHENSIVE METABOLIC PANEL
ALT: 9 U/L (ref 0–44)
AST: 21 U/L (ref 15–41)
Albumin: 3.4 g/dL — ABNORMAL LOW (ref 3.5–5.0)
Alkaline Phosphatase: 66 U/L (ref 38–126)
Anion gap: 8 (ref 5–15)
BUN: 36 mg/dL — ABNORMAL HIGH (ref 8–23)
CO2: 35 mmol/L — ABNORMAL HIGH (ref 22–32)
Calcium: 9.3 mg/dL (ref 8.9–10.3)
Chloride: 99 mmol/L (ref 98–111)
Creatinine, Ser: 1.42 mg/dL — ABNORMAL HIGH (ref 0.44–1.00)
GFR, Estimated: 34 mL/min — ABNORMAL LOW (ref >60)
Glucose, Bld: 89 mg/dL (ref 70–99)
Potassium: 3.8 mmol/L (ref 3.5–5.1)
Sodium: 142 mmol/L (ref 135–145)
Total Bilirubin: 0.6 mg/dL (ref 0.3–1.2)
Total Protein: 6.9 g/dL (ref 6.5–8.1)

## 2020-07-08 LAB — CBC
HCT: 34.6 % — ABNORMAL LOW (ref 36.0–46.0)
Hemoglobin: 10.9 g/dL — ABNORMAL LOW (ref 12.0–15.0)
MCH: 28.5 pg (ref 26.0–34.0)
MCHC: 31.5 g/dL (ref 30.0–36.0)
MCV: 90.3 fL (ref 80.0–100.0)
Platelets: 190 10*3/uL (ref 150–400)
RBC: 3.83 MIL/uL — ABNORMAL LOW (ref 3.87–5.11)
RDW: 17.2 % — ABNORMAL HIGH (ref 11.5–15.5)
WBC: 4.3 10*3/uL (ref 4.0–10.5)
nRBC: 0 % (ref 0.0–0.2)

## 2020-07-08 LAB — TSH: TSH: 0.706 u[IU]/mL (ref 0.350–4.500)

## 2020-07-08 LAB — VITAMIN B12: Vitamin B-12: 541 pg/mL (ref 180–914)

## 2020-07-08 LAB — FOLATE: Folate: 14.7 ng/mL (ref >5.9)

## 2020-07-09 DIAGNOSIS — F411 Generalized anxiety disorder: Secondary | ICD-10-CM | POA: Diagnosis not present

## 2020-07-10 DIAGNOSIS — F411 Generalized anxiety disorder: Secondary | ICD-10-CM | POA: Diagnosis not present

## 2020-07-13 ENCOUNTER — Non-Acute Institutional Stay (SKILLED_NURSING_FACILITY): Payer: Medicare Other | Admitting: Adult Health

## 2020-07-13 ENCOUNTER — Encounter: Payer: Self-pay | Admitting: Adult Health

## 2020-07-13 DIAGNOSIS — F0281 Dementia in other diseases classified elsewhere with behavioral disturbance: Secondary | ICD-10-CM | POA: Diagnosis not present

## 2020-07-13 DIAGNOSIS — F323 Major depressive disorder, single episode, severe with psychotic features: Secondary | ICD-10-CM

## 2020-07-13 DIAGNOSIS — G301 Alzheimer's disease with late onset: Secondary | ICD-10-CM | POA: Diagnosis not present

## 2020-07-13 NOTE — Progress Notes (Signed)
Location:    [facility] Room Number: 122/D Place of Service:  SNF (31)   CODE STATUS: DNR  Allergies  Allergen Reactions  . Bee Venom Anaphylaxis  . Ace Inhibitors   . Angiotensin Receptor Blockers     Tongue swelling.  . Aspirin     Directed by physician  . Cabbage Other (See Comments)    Certain foods due to gout flares  . Latex Rash    Chief Complaint  Patient presents with  . Acute Visit    Confusion    HPI:  Staff reports that she continues to be paranoid thinking that people are out to get her and her children. Staff reports that over the weekend she thought people were putting things in her food. When speaking to me she denies any anxiety; no paranoid thoughts or feelings; states that she has a good appetite. She states that she slept well last night.    Past Medical History:  Diagnosis Date  . Anxiety   . Cellulitis 2016  . CHF (congestive heart failure) ([place])   . Chronic kidney disease   . Dementia ([place])   . Dysphagia   . Fracture of radial neck, right, closed 08/06/2019  . Gait abnormality   . GERD (gastroesophageal reflux disease)   . Glaucoma   . Gout, unspecified 01/12/2010   Qualifier: Diagnosis of  By: Moshe Cipro MD, Joycelyn Schmid    . Hyperlipidemia   . Hypertension   . Muscle weakness   . Myocardial infarction [facility]) [place]  . Osteoarthritis   . Syncope     Past Surgical History:  Procedure Laterality Date  . APPENDECTOMY    . CATARACT EXTRACTION W/PHACO  08/04/2011   Procedure: CATARACT EXTRACTION PHACO AND INTRAOCULAR LENS PLACEMENT (IOC);  Surgeon: Tonny Branch;  Location: [facility] ORS;  Service: Ophthalmology;  Laterality: Right;  CDE=18.53  . CATARACT EXTRACTION W/PHACO  08/25/2011   Procedure: CATARACT EXTRACTION PHACO AND INTRAOCULAR LENS PLACEMENT (IOC);  Surgeon: Tonny Branch;  Location: [facility] ORS;  Service: Ophthalmology;  Laterality: Left;  CDE:14.76  . TONSILLECTOMY      Social History   Socioeconomic History  .  Marital status: Divorced    Spouse name: Not on file  . Number of children: Not on file  . Years of education: Not on file  . Highest education level: Not on file  Occupational History  . Occupation: retired   Tobacco Use  . Smoking status: Never Smoker  . Smokeless tobacco: Never Used  Vaping Use  . Vaping Use: Never used  Substance and Sexual Activity  . Alcohol use: No  . Drug use: No  . Sexual activity: Not Currently  Other Topics Concern  . Not on file  Social History Narrative   Long term resident [facility]    Social Determinants of Health   Financial Resource Strain:   . Difficulty of Paying Living Expenses: Not on file  Food Insecurity:   . Worried About Charity fundraiser in the Last Year: Not on file  . Ran Out of Food in the Last Year: Not on file  Transportation Needs:   . Lack of Transportation (Medical): Not on file  . Lack of Transportation (Non-Medical): Not on file  Physical Activity:   . Days of Exercise per Week: Not on file  . Minutes of Exercise per Session: Not on file  Stress:   . Feeling of Stress : Not on file  Social Connections:   .  Frequency of Communication with Friends and Family: Not on file  . Frequency of Social Gatherings with Friends and Family: Not on file  . Attends Religious Services: Not on file  . Active Member of Clubs or Organizations: Not on file  . Attends Archivist Meetings: Not on file  . Marital Status: Not on file  Intimate Partner Violence:   . Fear of Current or Ex-Partner: Not on file  . Emotionally Abused: Not on file  . Physically Abused: Not on file  . Sexually Abused: Not on file   Family History  Problem Relation Age of Onset  . Diabetes Father   . Diabetes Sister   . Diabetes Brother   . Stroke Sister   . Stroke Sister   . Anesthesia problems Neg Hx   . Hypotension Neg Hx   . Malignant hyperthermia Neg Hx   . Pseudochol deficiency Neg Hx       VITAL SIGNS BP (!) 154/54   Pulse (!) 57    Temp 98.4 F (36.9 C)   Resp 16   Ht 4\' 11"  (1.499 m)   Wt 128 lb 9.6 oz (58.3 kg)   SpO2 96%   BMI 25.97 kg/m   Outpatient Encounter Medications as of 07/13/2020  Medication Sig  . acetaminophen (TYLENOL) 500 MG tablet Take 1,000 mg by mouth every 8 (eight) hours as needed. For leg pain and arthritis. May have additional 1000 mg for break through pain once a day prn  . alum & mag hydroxide-simeth (MAALOX PLUS) 400-400-40 MG/5ML suspension Amt: 4 teaspoons; oral Special Instructions: for indigestion/heartburn Twice A Day - PRN PRN 1, PRN 2  . Alum & Mag Hydroxide-Simeth (MYLANTA PO) Take by mouth. liquid; regular; amt: 4 tsp.; oral Special Instructions: after breakfast Once A Day 09:00 AM  . Balsam [place]-Castor Oil (VENELEX) OINT Apply topically to sacrum and bilateral buttocks every shift and as needed  . Brinzolamide-Brimonidine (SIMBRINZA) 1-0.2 % SUSP One  drop into both eyes three times a day   . Calcium Carbonate-Vitamin D (CALCIUM-VITAMIN D) 500-200 MG-UNIT tablet Take 1 tablet by mouth 2 (two) times daily.  . citalopram (CELEXA) 10 MG tablet Take 30 mg by mouth daily.   . cycloSPORINE (RESTASIS) 0.05 % ophthalmic emulsion Place 1 drop into both eyes 2 (two) times daily.   Marland Kitchen Dextromethorphan-guaiFENesin (ROBITUSSIN DM PO) 15 ml by mouth every 6 hours for cough/congestion. May give up to 48 hrs. Inform physician immediately if cough or congestion is associated with fever or SOB Every 6 Hours - PRN  . docusate sodium (COLACE) 100 MG capsule Take 100 mg by mouth daily.   Marland Kitchen donepezil (ARICEPT) 5 MG tablet Take 5 mg by mouth daily. For dementia  . furosemide (LASIX) 40 MG tablet Take 40 mg by mouth 2 (two) times daily.  . hydrALAZINE (APRESOLINE) 50 MG tablet Take 50 mg by mouth 2 (two) times a day.   . ipratropium-albuterol (DUONEB) 0.5-2.5 (3) MG/3ML SOLN Take 3 mLs by nebulization every 6 (six) hours as needed.  . latanoprost (XALATAN) 0.005 % ophthalmic solution Place 1 drop into  both eyes at bedtime.  Marland Kitchen loratadine (CLARITIN) 10 MG tablet Take 10 mg by mouth daily.  Marland Kitchen LORazepam (ATIVAN) 0.5 MG tablet Take 0.5 tablets (0.25 mg total) by mouth 2 (two) times daily.  . nitroGLYCERIN (NITROSTAT) 0.4 MG SL tablet Place 0.4 mg under the tongue every 5 (five) minutes as needed for chest pain.   . NON FORMULARY Diet Type:  Regular  . OXYGEN Inhale 2 L/min into the lungs continuous. Wean to keep sats above 90%  . pantoprazole (PROTONIX) 40 MG tablet Take 40 mg by mouth daily. Do not crush.   . potassium chloride SA (KLOR-CON) 20 MEQ tablet Take 20 mEq by mouth 2 (two) times daily. DX hypokalemia  . QUEtiapine (SEROQUEL) 25 MG tablet Take 12.5 mg by mouth at bedtime. Special Instructions: delusions; hallucinations   No facility-administered encounter medications on file as of 07/13/2020.     SIGNIFICANT DIAGNOSTIC EXAMS   PREVIOUS ;   07-22-19: right forearm x-ray  1. Impacted fracture of the radial neck junction. 2. Suspect additional nondisplaced fracture of the coronoid process. 3. Right oval swelling and trace joint effusion. 4. Degenerative arthrosis of the wrist and elbow. Enthesopathic changes of the olecranon.  07-22-19: right shoulder x-ray  1. The osseous structures appear diffusely demineralized which may limit detection of small or nondisplaced fractures. 2. No acute fracture or traumatic malalignment. 3. High-riding appearance of the humeral head likely reflecting rotator cuff insufficiency. 4. Moderate osteoarthrosis of the acromioclavicular and glenohumeral joints.  03-21-20: chest x-ray: Mild congestive heart failure   NO NEW EXAMS.   LABS REVIEWED PREVIOUS:    09-06-19: wbc 4.4; hgb 10.8; hct 34.9; mcv 84.7 plt 261; glucose 81; bun 26; creat 1.21; k+ 3.5; na++ 141; ca 9.1 liver normal albumin 3.5    02-24-20: wbc 4.9; hgb 9.5; hct 30.0 mcv 82.2 plt 160; glucose 82; bun 49; creat 1.54; k+ 3.4; na++ 140; ca 8.7 liver normal albumin 3.4 02-27-20: glucose  84; bun 41; creat 1.29; k+ 3.9; na++ 142; ca 9.0  03-12-20: glucose 87; bun 45; creat 1.26; k+ 3.7; na++ 141; ca 9.0 03-18-20: tsh 1.548 vit B 12: 529 03-30-20: glucose 88; bun 26; creat 1.33; k+ 2.9; na++ 140 ca 9.0  03-31-20: k+ 3.6  NO NEW LABS.   Review of Systems  Constitutional: Negative for malaise/fatigue.  Respiratory: Negative for cough and shortness of breath.   Cardiovascular: Negative for chest pain, palpitations and leg swelling.  Gastrointestinal: Negative for abdominal pain, constipation and heartburn.  Musculoskeletal: Negative for back pain, joint pain and myalgias.  Skin: Negative.   Neurological: Negative for dizziness.  Psychiatric/Behavioral: The patient is not nervous/anxious.     Physical Exam Constitutional:      General: She is not in acute distress.    Appearance: She is well-developed. She is not diaphoretic.  Neck:     Thyroid: No thyromegaly.  Cardiovascular:     Rate and Rhythm: Normal rate and regular rhythm.     Pulses: Normal pulses.     Heart sounds: Normal heart sounds.  Pulmonary:     Effort: Pulmonary effort is normal. No respiratory distress.     Breath sounds: Normal breath sounds.  Abdominal:     General: Bowel sounds are normal. There is no distension.     Palpations: Abdomen is soft.     Tenderness: There is no abdominal tenderness.  Musculoskeletal:        General: Normal range of motion.     Cervical back: Neck supple.     Right lower leg: No edema.     Left lower leg: No edema.  Lymphadenopathy:     Cervical: No cervical adenopathy.  Skin:    General: Skin is warm and dry.     Comments: Bilateral lower extremities discolored    Neurological:     Mental Status: She is alert. Mental status is at  baseline.  Psychiatric:        Mood and Affect: Mood normal.      ASSESSMENT/ PLAN:  TODAY:   1. Major depression with psychotic features 2. Late onset alzheimer's disease with behavioral disturbance  She continues to have  hallucinations and delusions; will increase seroquel to 12.5 mg twice daily and will monitor her status.    MD is aware of resident's narcotic use and is in agreement with current plan of care. We will attempt to wean resident as appropriate.  Ok Edwards NP [facility]  Contact [phone] Monday through Friday 8am- 5pm  After hours call [phone]

## 2020-07-14 ENCOUNTER — Other Ambulatory Visit: Payer: Self-pay | Admitting: Adult Health

## 2020-07-14 MED ORDER — LORAZEPAM 0.5 MG PO TABS: 0.2500 mg | ORAL_TABLET | Freq: Two times a day (BID) | ORAL | 0 refills | Status: DC

## 2020-07-16 DIAGNOSIS — F411 Generalized anxiety disorder: Secondary | ICD-10-CM | POA: Diagnosis not present

## 2020-07-21 DIAGNOSIS — F411 Generalized anxiety disorder: Secondary | ICD-10-CM | POA: Diagnosis not present

## 2020-07-28 DIAGNOSIS — Z1159 Encounter for screening for other viral diseases: Secondary | ICD-10-CM | POA: Diagnosis not present

## 2020-07-28 DIAGNOSIS — M79601 Pain in right arm: Secondary | ICD-10-CM | POA: Diagnosis not present

## 2020-07-28 DIAGNOSIS — I130 Hypertensive heart and chronic kidney disease with heart failure and stage 1 through stage 4 chronic kidney disease, or unspecified chronic kidney disease: Secondary | ICD-10-CM | POA: Diagnosis not present

## 2020-07-29 DIAGNOSIS — Z23 Encounter for immunization: Secondary | ICD-10-CM | POA: Diagnosis not present

## 2020-07-30 ENCOUNTER — Non-Acute Institutional Stay (SKILLED_NURSING_FACILITY): Payer: Medicare Other | Admitting: Adult Health

## 2020-07-30 ENCOUNTER — Encounter: Payer: Self-pay | Admitting: Adult Health

## 2020-07-30 DIAGNOSIS — F0281 Dementia in other diseases classified elsewhere with behavioral disturbance: Secondary | ICD-10-CM | POA: Diagnosis not present

## 2020-07-30 DIAGNOSIS — G301 Alzheimer's disease with late onset: Secondary | ICD-10-CM | POA: Diagnosis not present

## 2020-07-30 DIAGNOSIS — R627 Adult failure to thrive: Secondary | ICD-10-CM

## 2020-07-30 NOTE — Progress Notes (Signed)
Location:    [facility] Room Number: 122/D Place of Service:  SNF (31)   CODE STATUS: DNR  Allergies  Allergen Reactions  . Bee Venom Anaphylaxis  . Ace Inhibitors   . Angiotensin Receptor Blockers     Tongue swelling.  . Aspirin     Directed by physician  . Cabbage Other (See Comments)    Certain foods due to gout flares  . Latex Rash    Chief Complaint  Patient presents with  . Acute Visit    Weight Loss    HPI:  She continues to lose weight in November her weight was 128.6 pounds her current weight is 121.4 pounds. She has lost another 7 pounds. She has been recently started on seroquel 12.5 mg nightly for her delusions. She will decline nursing care at times. She is on lasix 40 mg twice daily with k+ supplement. She is also on aricept 5 mg nightly.   Past Medical History:  Diagnosis Date  . Anxiety   . Cellulitis 2016  . CHF (congestive heart failure) ([place])   . Chronic kidney disease   . Dementia ([place])   . Dysphagia   . Fracture of radial neck, right, closed 08/06/2019  . Gait abnormality   . GERD (gastroesophageal reflux disease)   . Glaucoma   . Gout, unspecified 01/12/2010   Qualifier: Diagnosis of  By: Moshe Cipro MD, Joycelyn Schmid    . Hyperlipidemia   . Hypertension   . Muscle weakness   . Myocardial infarction [facility]) [place]  . Osteoarthritis   . Syncope     Past Surgical History:  Procedure Laterality Date  . APPENDECTOMY    . CATARACT EXTRACTION W/PHACO  08/04/2011   Procedure: CATARACT EXTRACTION PHACO AND INTRAOCULAR LENS PLACEMENT (IOC);  Surgeon: Tonny Branch;  Location: [facility]S;  Service: Ophthalmology;  Laterality: Right;  CDE=18.53  . CATARACT EXTRACTION W/PHACO  08/25/2011   Procedure: CATARACT EXTRACTION PHACO AND INTRAOCULAR LENS PLACEMENT (IOC);  Surgeon: Tonny Branch;  Location: [facility]S;  Service: Ophthalmology;  Laterality: Left;  CDE:14.76  . TONSILLECTOMY      Social History   Socioeconomic History  . Marital  status: Divorced    Spouse name: Not on file  . Number of children: Not on file  . Years of education: Not on file  . Highest education level: Not on file  Occupational History  . Occupation: retired   Tobacco Use  . Smoking status: Never Smoker  . Smokeless tobacco: Never Used  Vaping Use  . Vaping Use: Never used  Substance and Sexual Activity  . Alcohol use: No  . Drug use: No  . Sexual activity: Not Currently  Other Topics Concern  . Not on file  Social History Narrative   Long term resident [facility]    Social Determinants of Health   Financial Resource Strain:   . Difficulty of Paying Living Expenses: Not on file  Food Insecurity:   . Worried About Charity fundraiser in the Last Year: Not on file  . Ran Out of Food in the Last Year: Not on file  Transportation Needs:   . Lack of Transportation (Medical): Not on file  . Lack of Transportation (Non-Medical): Not on file  Physical Activity:   . Days of Exercise per Week: Not on file  . Minutes of Exercise per Session: Not on file  Stress:   . Feeling of Stress : Not on file  Social Connections:   .  Frequency of Communication with Friends and Family: Not on file  . Frequency of Social Gatherings with Friends and Family: Not on file  . Attends Religious Services: Not on file  . Active Member of Clubs or Organizations: Not on file  . Attends Archivist Meetings: Not on file  . Marital Status: Not on file  Intimate Partner Violence:   . Fear of Current or Ex-Partner: Not on file  . Emotionally Abused: Not on file  . Physically Abused: Not on file  . Sexually Abused: Not on file   Family History  Problem Relation Age of Onset  . Diabetes Father   . Diabetes Sister   . Diabetes Brother   . Stroke Sister   . Stroke Sister   . Anesthesia problems Neg Hx   . Hypotension Neg Hx   . Malignant hyperthermia Neg Hx   . Pseudochol deficiency Neg Hx       VITAL SIGNS BP (!) 175/69   Pulse (!) 52   Temp 98.2  F (36.8 C)   Resp 18   Ht 4\' 11"  (1.499 m)   Wt 121 lb 6.4 oz (55.1 kg)   SpO2 96%   BMI 24.52 kg/m    Recheck b/p 138/78  Outpatient Encounter Medications as of 07/30/2020  Medication Sig  . acetaminophen (TYLENOL) 500 MG tablet Take 1,000 mg by mouth every 8 (eight) hours as needed. For leg pain and arthritis. May have additional 1000 mg for break through pain once a day prn  . alum & mag hydroxide-simeth (MAALOX PLUS) 400-400-40 MG/5ML suspension Amt: 4 teaspoons; oral Special Instructions: for indigestion/heartburn Twice A Day - PRN PRN 1, PRN 2  . Alum & Mag Hydroxide-Simeth (MYLANTA PO) Take by mouth. liquid; regular; amt: 4 tsp.; oral Special Instructions: after breakfast Once A Day 09:00 AM  . Balsam [place]-Castor Oil (VENELEX) OINT Apply topically to sacrum and bilateral buttocks every shift and as needed  . Brinzolamide-Brimonidine (SIMBRINZA) 1-0.2 % SUSP One  drop into both eyes three times a day   . Calcium Carbonate-Vitamin D (CALCIUM-VITAMIN D) 500-200 MG-UNIT tablet Take 1 tablet by mouth 2 (two) times daily.  . citalopram (CELEXA) 10 MG tablet Take 30 mg by mouth daily.   . cycloSPORINE (RESTASIS) 0.05 % ophthalmic emulsion Place 1 drop into both eyes 2 (two) times daily.   Marland Kitchen Dextromethorphan-guaiFENesin (ROBITUSSIN DM PO) 15 ml by mouth every 6 hours for cough/congestion. May give up to 48 hrs. Inform physician immediately if cough or congestion is associated with fever or SOB Every 6 Hours - PRN  . docusate sodium (COLACE) 100 MG capsule Take 100 mg by mouth daily.   . furosemide (LASIX) 40 MG tablet Take 40 mg by mouth daily.   . hydrALAZINE (APRESOLINE) 50 MG tablet Take 50 mg by mouth 2 (two) times a day.   . ipratropium-albuterol (DUONEB) 0.5-2.5 (3) MG/3ML SOLN Take 3 mLs by nebulization every 6 (six) hours as needed.  . latanoprost (XALATAN) 0.005 % ophthalmic solution Place 1 drop into both eyes at bedtime.  Marland Kitchen loratadine (CLARITIN) 10 MG tablet Take 10 mg by  mouth daily.  Marland Kitchen LORazepam (ATIVAN) 0.5 MG tablet Take 0.5 tablets (0.25 mg total) by mouth 2 (two) times daily.  . nitroGLYCERIN (NITROSTAT) 0.4 MG SL tablet Place 0.4 mg under the tongue every 5 (five) minutes as needed for chest pain.   . NON FORMULARY Diet Type:  Regular  . OXYGEN Inhale 2 L/min into the lungs continuous.  Wean to keep sats above 90%  . pantoprazole (PROTONIX) 40 MG tablet Take 40 mg by mouth daily. Do not crush.   . potassium chloride SA (KLOR-CON) 20 MEQ tablet Take 20 mEq by mouth daily. DX hypokalemia   . QUEtiapine (SEROQUEL) 25 MG tablet Take 12.5 mg by mouth 2 (two) times daily. Special Instructions: delusions; hallucinations   . [DISCONTINUED] donepezil (ARICEPT) 5 MG tablet Take 5 mg by mouth daily. For dementia   No facility-administered encounter medications on file as of 07/30/2020.     SIGNIFICANT DIAGNOSTIC EXAMS  PREVIOUS ;   07-22-19: right forearm x-ray  1. Impacted fracture of the radial neck junction. 2. Suspect additional nondisplaced fracture of the coronoid process. 3. Right oval swelling and trace joint effusion. 4. Degenerative arthrosis of the wrist and elbow. Enthesopathic changes of the olecranon.  07-22-19: right shoulder x-ray  1. The osseous structures appear diffusely demineralized which may limit detection of small or nondisplaced fractures. 2. No acute fracture or traumatic malalignment. 3. High-riding appearance of the humeral head likely reflecting rotator cuff insufficiency. 4. Moderate osteoarthrosis of the acromioclavicular and glenohumeral joints.  03-21-20: chest x-ray: Mild congestive heart failure   NO NEW EXAMS.   LABS REVIEWED PREVIOUS:    09-06-19: wbc 4.4; hgb 10.8; hct 34.9; mcv 84.7 plt 261; glucose 81; bun 26; creat 1.21; k+ 3.5; na++ 141; ca 9.1 liver normal albumin 3.5    02-24-20: wbc 4.9; hgb 9.5; hct 30.0 mcv 82.2 plt 160; glucose 82; bun 49; creat 1.54; k+ 3.4; na++ 140; ca 8.7 liver normal albumin 3.4 02-27-20:  glucose 84; bun 41; creat 1.29; k+ 3.9; na++ 142; ca 9.0  03-12-20: glucose 87; bun 45; creat 1.26; k+ 3.7; na++ 141; ca 9.0 03-18-20: tsh 1.548 vit B 12: 529 03-30-20: glucose 88; bun 26; creat 1.33; k+ 2.9; na++ 140 ca 9.0  03-31-20: k+ 3.6  TODAY  07-08-20: wbc 4.3; hgb 10.9; hct 34.6 ;mcv 90.3 plt 190; glucose 89; bun 36; creat 1.42; k+ 3.8; na++ 142; ca 9.3 liver normal albumin 3.4 folate 14.7; vit B 12: 541; tsh 0.706    Review of Systems  Constitutional: Negative for malaise/fatigue.  Respiratory: Negative for cough and shortness of breath.   Cardiovascular: Negative for chest pain, palpitations and leg swelling.  Gastrointestinal: Negative for abdominal pain, constipation and heartburn.  Musculoskeletal: Negative for back pain, joint pain and myalgias.  Skin: Negative.   Neurological: Negative for dizziness.  Psychiatric/Behavioral: The patient is not nervous/anxious.     Physical Exam Constitutional:      General: She is not in acute distress.    Appearance: She is well-developed. She is not diaphoretic.  Neck:     Thyroid: No thyromegaly.  Cardiovascular:     Rate and Rhythm: Normal rate and regular rhythm.     Pulses: Normal pulses.     Heart sounds: Normal heart sounds.  Pulmonary:     Effort: Pulmonary effort is normal. No respiratory distress.     Breath sounds: Normal breath sounds.  Abdominal:     General: Bowel sounds are normal. There is no distension.     Palpations: Abdomen is soft.     Tenderness: There is no abdominal tenderness.  Musculoskeletal:        General: Normal range of motion.     Cervical back: Neck supple.     Right lower leg: No edema.     Left lower leg: No edema.  Lymphadenopathy:     Cervical:  No cervical adenopathy.  Skin:    General: Skin is warm and dry.     Comments: Bilateral lower extremities discolored     Neurological:     Mental Status: She is alert. Mental status is at baseline.  Psychiatric:        Mood and Affect: Mood  normal.       ASSESSMENT/ PLAN:  TODAY  1. Failure to thrive in adult 2. Late onset alzheimer's disease with behavioral disturbance.   Will stop the following medications: aricept.  Will lower to: lasix 40 mg daily with k+ 20 meq daily  Will continue to monitor her status.     MD is aware of resident's narcotic use and is in agreement with current plan of care. We will attempt to wean resident as appropriate.  Ok Edwards NP [facility]  Contact [phone] Monday through Friday 8am- 5pm  After hours call [phone]

## 2020-08-04 DIAGNOSIS — R627 Adult failure to thrive: Secondary | ICD-10-CM | POA: Insufficient documentation

## 2020-08-05 ENCOUNTER — Encounter: Payer: Self-pay | Admitting: Adult Health

## 2020-08-05 ENCOUNTER — Non-Acute Institutional Stay (SKILLED_NURSING_FACILITY): Payer: Medicare Other | Admitting: Adult Health

## 2020-08-05 DIAGNOSIS — I5032 Chronic diastolic (congestive) heart failure: Secondary | ICD-10-CM

## 2020-08-05 DIAGNOSIS — I82402 Acute embolism and thrombosis of unspecified deep veins of left lower extremity: Secondary | ICD-10-CM | POA: Diagnosis not present

## 2020-08-05 DIAGNOSIS — K5909 Other constipation: Secondary | ICD-10-CM

## 2020-08-05 NOTE — Progress Notes (Signed)
Location:    [facility] Room Number: 122/D Place of Service:  SNF (31)   CODE STATUS: DNR  Allergies  Allergen Reactions   Bee Venom Anaphylaxis   Ace Inhibitors    Angiotensin Receptor Blockers     Tongue swelling.   Aspirin     Directed by physician   Cabbage Other (See Comments)    Certain foods due to gout flares   Latex Rash    Chief Complaint  Patient presents with   Medical Management of Chronic Issues         Chronic constipation:   Chronic diastolic CHF (congestive heart failure)    Deep vein thrombosis (DVT) of left lower extremity unspecified chronicity unspecified vein    HPI:  She is a 86 year old long term resident of this facility being seen for the management of her chronic illnesses:Chronic constipation:   Chronic diastolic CHF (congestive heart failure)    Deep vein thrombosis (DVT) of left lower extremity unspecified chronicity unspecified vein. There are no reports of agitation; no reports of insomnia; no report of cough or shortness of breath no reports of constipation.    Past Medical History:  Diagnosis Date   Anxiety    Cellulitis 2016   CHF (congestive heart failure) (HCC)    Chronic kidney disease    Dementia (HCC)    Dysphagia    Fracture of radial neck, right, closed 08/06/2019   Gait abnormality    GERD (gastroesophageal reflux disease)    Glaucoma    Gout, unspecified 01/12/2010   Qualifier: Diagnosis of  By: Moshe Cipro MD, Margaret     Hyperlipidemia    Hypertension    Muscle weakness    Myocardial infarction ([place]) 1990s   [place]   Osteoarthritis    Syncope     Past Surgical History:  Procedure Laterality Date   APPENDECTOMY     CATARACT EXTRACTION W/PHACO  08/04/2011   Procedure: CATARACT EXTRACTION PHACO AND INTRAOCULAR LENS PLACEMENT ([place]);  Surgeon: Tonny Branch;  Location: [facility];  Service: Ophthalmology;  Laterality: Right;  CDE=18.53   CATARACT EXTRACTION W/PHACO  08/25/2011    Procedure: CATARACT EXTRACTION PHACO AND INTRAOCULAR LENS PLACEMENT ([facility]);  Surgeon: Tonny Branch;  Location: [facility];  Service: Ophthalmology;  Laterality: Left;  CDE:14.76   TONSILLECTOMY      Social History   Socioeconomic History   Marital status: Divorced    Spouse name: Not on file   Number of children: Not on file   Years of education: Not on file   Highest education level: Not on file  Occupational History   Occupation: retired   Tobacco Use   Smoking status: Never Smoker   Smokeless tobacco: Never Used  Scientific laboratory technician Use: Never used  Substance and Sexual Activity   Alcohol use: No   Drug use: No   Sexual activity: Not Currently  Other Topics Concern   Not on file  Social History Narrative   Long term resident [facility]    Social Determinants of Health   Financial Resource Strain:    Difficulty of Paying Living Expenses: Not on file  Food Insecurity:    Worried About Charity fundraiser in the Last Year: Not on file   [place] in the Last Year: Not on file  Transportation Needs:    Lack of Transportation (Medical): Not on file   Lack of Transportation (Non-Medical): Not on file  Physical Activity:  Days of Exercise per Week: Not on file   Minutes of Exercise per Session: Not on file  Stress:    Feeling of Stress : Not on file  Social Connections:    Frequency of Communication with Friends and Family: Not on file   Frequency of Social Gatherings with Friends and Family: Not on file   Attends Religious Services: Not on file   Active Member of Clubs or Organizations: Not on file   Attends Archivist Meetings: Not on file   Marital Status: Not on file  Intimate Partner Violence:    Fear of Current or Ex-Partner: Not on file   Emotionally Abused: Not on file   Physically Abused: Not on file   Sexually Abused: Not on file   Family History  Problem Relation Age of Onset   Diabetes Father    Diabetes Sister     Diabetes Brother    Stroke Sister    Stroke Sister    Anesthesia problems Neg Hx    Hypotension Neg Hx    Malignant hyperthermia Neg Hx    Pseudochol deficiency Neg Hx       VITAL SIGNS BP 134/68    Pulse (!) 54    Temp 98.2 F (36.8 C)    Resp 18    Ht 4\' 11"  (1.499 m)    Wt 121 lb 6.4 oz (55.1 kg)    SpO2 99%    BMI 24.52 kg/m   Outpatient Encounter Medications as of 08/05/2020  Medication Sig   acetaminophen (TYLENOL) 500 MG tablet Take 1,000 mg by mouth every 8 (eight) hours as needed. For leg pain and arthritis. May have additional 1000 mg for break through pain once a day prn   alum & mag hydroxide-simeth (MAALOX PLUS) 400-400-40 MG/5ML suspension Amt: 4 teaspoons; oral Special Instructions: for indigestion/heartburn Twice A Day - PRN PRN 1, PRN 2   Alum & Mag Hydroxide-Simeth (MYLANTA PO) Take by mouth. liquid; regular; amt: 4 tsp.; oral Special Instructions: after breakfast Once A Day 09:00 AM   Balsam [place]-Castor Oil (VENELEX) OINT Apply topically to sacrum and bilateral buttocks every shift and as needed   Brinzolamide-Brimonidine (SIMBRINZA) 1-0.2 % SUSP One  drop into both eyes three times a day    Calcium Carbonate-Vitamin D (CALCIUM-VITAMIN D) 500-200 MG-UNIT tablet Take 1 tablet by mouth 2 (two) times daily.   citalopram (CELEXA) 10 MG tablet Take 30 mg by mouth daily.    cycloSPORINE (RESTASIS) 0.05 % ophthalmic emulsion Place 1 drop into both eyes 2 (two) times daily.    Dextromethorphan-guaiFENesin (ROBITUSSIN DM PO) 15 ml by mouth every 6 hours for cough/congestion. May give up to 48 hrs. Inform physician immediately if cough or congestion is associated with fever or SOB Every 6 Hours - PRN   docusate sodium (COLACE) 100 MG capsule Take 100 mg by mouth daily.    furosemide (LASIX) 40 MG tablet Take 40 mg by mouth daily.    hydrALAZINE (APRESOLINE) 50 MG tablet Take 50 mg by mouth 2 (two) times a day.    ipratropium-albuterol (DUONEB)  0.5-2.5 (3) MG/3ML SOLN Take 3 mLs by nebulization every 6 (six) hours as needed.   latanoprost (XALATAN) 0.005 % ophthalmic solution Place 1 drop into both eyes at bedtime.   loratadine (CLARITIN) 10 MG tablet Take 10 mg by mouth daily.   LORazepam (ATIVAN) 0.5 MG tablet Take 0.5 tablets (0.25 mg total) by mouth 2 (two) times daily.   nitroGLYCERIN (NITROSTAT) 0.4  MG SL tablet Place 0.4 mg under the tongue every 5 (five) minutes as needed for chest pain.    NON FORMULARY Diet Type:  Regular   OXYGEN Inhale 2 L/min into the lungs continuous. Wean to keep sats above 90%   pantoprazole (PROTONIX) 40 MG tablet Take 40 mg by mouth daily. Do not crush.    potassium chloride SA (KLOR-CON) 20 MEQ tablet Take 20 mEq by mouth daily. DX hypokalemia    QUEtiapine (SEROQUEL) 25 MG tablet Take 12.5 mg by mouth 2 (two) times daily. Special Instructions: delusions; hallucinations    No facility-administered encounter medications on file as of 08/05/2020.     SIGNIFICANT DIAGNOSTIC EXAMS   PREVIOUS ;   07-22-19: right forearm x-ray  1. Impacted fracture of the radial neck junction. 2. Suspect additional nondisplaced fracture of the coronoid process. 3. Right oval swelling and trace joint effusion. 4. Degenerative arthrosis of the wrist and elbow. Enthesopathic changes of the olecranon.  07-22-19: right shoulder x-ray  1. The osseous structures appear diffusely demineralized which may limit detection of small or nondisplaced fractures. 2. No acute fracture or traumatic malalignment. 3. High-riding appearance of the humeral head likely reflecting rotator cuff insufficiency. 4. Moderate osteoarthrosis of the acromioclavicular and glenohumeral joints.  03-21-20: chest x-ray: Mild congestive heart failure   NO NEW EXAMS.   LABS REVIEWED PREVIOUS:    09-06-19: wbc 4.4; hgb 10.8; hct 34.9; mcv 84.7 plt 261; glucose 81; bun 26; creat 1.21; k+ 3.5; na++ 141; ca 9.1 liver normal albumin 3.5     02-24-20: wbc 4.9; hgb 9.5; hct 30.0 mcv 82.2 plt 160; glucose 82; bun 49; creat 1.54; k+ 3.4; na++ 140; ca 8.7 liver normal albumin 3.4 02-27-20: glucose 84; bun 41; creat 1.29; k+ 3.9; na++ 142; ca 9.0  03-12-20: glucose 87; bun 45; creat 1.26; k+ 3.7; na++ 141; ca 9.0 03-18-20: tsh 1.548 vit B 12: 529 03-30-20: glucose 88; bun 26; creat 1.33; k+ 2.9; na++ 140 ca 9.0  03-31-20: k+ 3.6  TODAY.  07-08-20: wbc 4.3; hgb 10.9; hct 34.6; mcv 90.3 plt 190; glucose 89; bun 36; creat 1.42; k+ 3.8; na++ 142; ca 9.3 liver normal albumin 3.4 folate 14.7; vit B 12: 541 tsh 1.548   Review of Systems  Constitutional: Negative for malaise/fatigue.  Respiratory: Negative for cough and shortness of breath.   Cardiovascular: Negative for chest pain, palpitations and leg swelling.  Gastrointestinal: Negative for abdominal pain, constipation and heartburn.  Musculoskeletal: Negative for back pain, joint pain and myalgias.  Skin: Negative.   Neurological: Negative for dizziness.  Psychiatric/Behavioral: The patient is not nervous/anxious.     Physical Exam Constitutional:      General: She is not in acute distress.    Appearance: She is well-developed and well-nourished. She is not diaphoretic.  Neck:     Thyroid: No thyromegaly.  Cardiovascular:     Rate and Rhythm: Regular rhythm. Bradycardia present.     Pulses: Normal pulses and intact distal pulses.     Heart sounds: Normal heart sounds.  Pulmonary:     Effort: Pulmonary effort is normal. No respiratory distress.     Breath sounds: Normal breath sounds.  Abdominal:     General: Bowel sounds are normal. There is no distension.     Palpations: Abdomen is soft.     Tenderness: There is no abdominal tenderness.  Musculoskeletal:        General: No edema. Normal range of motion.     Cervical  back: Neck supple.     Right lower leg: No edema.     Left lower leg: No edema.  Lymphadenopathy:     Cervical: No cervical adenopathy.  Skin:    General:  Skin is warm and dry.     Comments: Bilateral lower extremities discolored      Neurological:     Mental Status: She is alert. Mental status is at baseline.  Psychiatric:        Mood and Affect: Mood and affect and mood normal.     ASSESSMENT/ PLAN:  TODAY:   1. Chronic constipation: is stable will continue colace twice daily  2. Chronic diastolic CHF (congestive heart failure) EF 65-70% (03-04-18) is stable will continue apresoline 50 mg twice daily lasix 40 mg daily has prn ntg  3. Deep vein thrombosis (DVT) of left lower extremity unspecified chronicity unspecified vein: is stable will continue to monitor her status.   PREVIOUS   4. Chronic non-seasonal allergic rhinitis: is stable will continue claritin 10 mg daily   5. Hypertensive heart and kidney disease with chronic diastolic congestive heart failure and stage 2 chronic kidney disease is stable b/p 134/68 will continue apresoline 50 mg twice daily and lasix 40 mg daily  6. GERD without esophagitis: is stable will continue protonix 40 mg daily   7. Chronic respiratory failure with hypoxia on home 02 therapy: is stable will continue claritin 10 mg daily and is on 02  8. Increased intraocular pressure bilateral is stable will continue simbrinza three times daily travatan nightly to both eyes.   9. Hypokalemia: is stable will continue k+ 20 meq daily   10. Primary osteoarthritis multiple joints is stable will continue tylenol 1 gm twice daily   11. Late onset alzheimer's disease with behavioral disturbance: is stable is off aricept will monitor   12. Major depression with psychotic features is worse; will continue celexa 30 mg daily (has failed one wean from celexa)  ativan 0.25 mg twice daily (has failed 2 weans from ativan) will begin seroquel 12.5 mg nightly   13. CKD (chronic kidney disease) stage 3 GFR 30-59 ml/min is stable bun 36; creat 1.42     MD is aware of resident's narcotic use and is in agreement with  current plan of care. We will attempt to wean resident as appropriate.  Ok Edwards NP [facility]  Contact [phone] Monday through Friday 8am- 5pm  After hours call [phone]

## 2020-08-07 DIAGNOSIS — F411 Generalized anxiety disorder: Secondary | ICD-10-CM | POA: Diagnosis not present

## 2020-08-11 ENCOUNTER — Encounter: Payer: Self-pay | Admitting: Adult Health

## 2020-08-11 ENCOUNTER — Other Ambulatory Visit: Payer: Self-pay | Admitting: Adult Health

## 2020-08-11 ENCOUNTER — Non-Acute Institutional Stay: Payer: Self-pay | Admitting: Adult Health

## 2020-08-11 DIAGNOSIS — G301 Alzheimer's disease with late onset: Secondary | ICD-10-CM

## 2020-08-11 DIAGNOSIS — F323 Major depressive disorder, single episode, severe with psychotic features: Secondary | ICD-10-CM

## 2020-08-11 DIAGNOSIS — F0281 Dementia in other diseases classified elsewhere with behavioral disturbance: Secondary | ICD-10-CM | POA: Diagnosis not present

## 2020-08-11 MED ORDER — LORAZEPAM 0.5 MG PO TABS: 0.2500 mg | ORAL_TABLET | Freq: Two times a day (BID) | ORAL | 0 refills | Status: DC

## 2020-08-11 NOTE — Progress Notes (Signed)
Location:  [facility] Room Number: 122-D Place of Service:  SNF (31)   CODE STATUS: DNR  Allergies  Allergen Reactions  . Bee Venom Anaphylaxis  . Ace Inhibitors   . Angiotensin Receptor Blockers     Tongue swelling.  . Aspirin     Directed by physician  . Cabbage Other (See Comments)    Certain foods due to gout flares  . Latex Rash    Chief Complaint  Patient presents with  . Acute Visit    Medication review    HPI:  She is presently taking seroquel 12.5 mg twice daily and ativan 0.25 mg twice daily. Her seroquel was increased in November 2021. She does have delusional thought patterns at times. She is spending more time in bed; which is a change from spending all her time in bed. There are no reports of agitation. She is due for a GDR. However the care team thinks that lowering medications at this time would cause her emotional and mental harm.    Past Medical History:  Diagnosis Date  . Anxiety   . Cellulitis 2016  . CHF (congestive heart failure) ([place])   . Chronic kidney disease   . Dementia ([place])   . Dysphagia   . Fracture of radial neck, right, closed 08/06/2019  . Gait abnormality   . GERD (gastroesophageal reflux disease)   . Glaucoma   . Gout, unspecified 01/12/2010   Qualifier: Diagnosis of  By: Moshe Cipro MD, Joycelyn Schmid    . Hyperlipidemia   . Hypertension   . Muscle weakness   . Myocardial infarction [facility] [place]  . Osteoarthritis   . Syncope     Past Surgical History:  Procedure Laterality Date  . APPENDECTOMY    . CATARACT EXTRACTION W/PHACO  08/04/2011   Procedure: CATARACT EXTRACTION PHACO AND INTRAOCULAR LENS PLACEMENT ([facility]);  Surgeon: Tonny Branch;  Location: [facility];  Service: Ophthalmology;  Laterality: Right;  CDE=18.53  . CATARACT EXTRACTION W/PHACO  08/25/2011   Procedure: CATARACT EXTRACTION PHACO AND INTRAOCULAR LENS PLACEMENT ([facility]);  Surgeon: Tonny Branch;  Location: [facility];  Service: Ophthalmology;   Laterality: Left;  CDE:14.76  . TONSILLECTOMY      Social History   Socioeconomic History  . Marital status: Divorced    Spouse name: Not on file  . Number of children: Not on file  . Years of education: Not on file  . Highest education level: Not on file  Occupational History  . Occupation: retired   Tobacco Use  . Smoking status: Never Smoker  . Smokeless tobacco: Never Used  Vaping Use  . Vaping Use: Never used  Substance and Sexual Activity  . Alcohol use: No  . Drug use: No  . Sexual activity: Not Currently  Other Topics Concern  . Not on file  Social History Narrative   Long term resident [facility]    Social Determinants of Health   Financial Resource Strain: Not on file  Food Insecurity: Not on file  Transportation Needs: Not on file  Physical Activity: Not on file  Stress: Not on file  Social Connections: Not on file  Intimate Partner Violence: Not on file   Family History  Problem Relation Age of Onset  . Diabetes Father   . Diabetes Sister   . Diabetes Brother   . Stroke Sister   . Stroke Sister   . Anesthesia problems Neg Hx   . Hypotension Neg Hx   . Malignant hyperthermia  Neg Hx   . Pseudochol deficiency Neg Hx       VITAL SIGNS BP (!) 152/58   Pulse 60   Temp 98 F (36.7 C)   Resp 16   Ht 4\' 11"  (1.499 m)   Wt 121 lb 6.4 oz (55.1 kg)   SpO2 94%   BMI 24.52 kg/m   Outpatient Encounter Medications as of 08/11/2020  Medication Sig  . acetaminophen (TYLENOL) 500 MG tablet Take 1,000 mg by mouth every 8 (eight) hours as needed. For leg pain and arthritis. May have additional 1000 mg for break through pain once a day prn  . alum & mag hydroxide-simeth (MAALOX PLUS) 400-400-40 MG/5ML suspension Amt: 4 teaspoons; oral Special Instructions: for indigestion/heartburn Twice A Day - PRN PRN 1, PRN 2  . Alum & Mag Hydroxide-Simeth (MYLANTA PO) Take by mouth. liquid; regular; amt: 4 tsp.; oral Special Instructions: after breakfast Once A Day 09:00  AM  . Balsam [place]-Castor Oil (VENELEX) OINT Apply topically to sacrum and bilateral buttocks every shift and as needed  . Brinzolamide-Brimonidine (SIMBRINZA) 1-0.2 % SUSP One  drop into both eyes three times a day   . Calcium Carbonate-Vitamin D (CALCIUM-VITAMIN D) 500-200 MG-UNIT tablet Take 1 tablet by mouth 2 (two) times daily.  . citalopram (CELEXA) 10 MG tablet Take 30 mg by mouth daily.   . cycloSPORINE (RESTASIS) 0.05 % ophthalmic emulsion Place 1 drop into both eyes 2 (two) times daily.   Marland Kitchen Dextromethorphan-guaiFENesin (ROBITUSSIN DM PO) 15 ml by mouth every 6 hours for cough/congestion. May give up to 48 hrs. Inform physician immediately if cough or congestion is associated with fever or SOB Every 6 Hours - PRN  . docusate sodium (COLACE) 100 MG capsule Take 100 mg by mouth daily.   . furosemide (LASIX) 40 MG tablet Take 40 mg by mouth daily.   . hydrALAZINE (APRESOLINE) 50 MG tablet Take 50 mg by mouth 2 (two) times a day.   . ipratropium-albuterol (DUONEB) 0.5-2.5 (3) MG/3ML SOLN Take 3 mLs by nebulization every 6 (six) hours as needed.  . latanoprost (XALATAN) 0.005 % ophthalmic solution Place 1 drop into both eyes at bedtime.  Marland Kitchen loratadine (CLARITIN) 10 MG tablet Take 10 mg by mouth daily.  Marland Kitchen LORazepam (ATIVAN) 0.5 MG tablet Take 0.5 tablets (0.25 mg total) by mouth 2 (two) times daily.  . nitroGLYCERIN (NITROSTAT) 0.4 MG SL tablet Place 0.4 mg under the tongue every 5 (five) minutes as needed for chest pain.   . NON FORMULARY Diet Type:  Regular  . OXYGEN Inhale 2 L/min into the lungs continuous. Wean to keep sats above 90%  . pantoprazole (PROTONIX) 40 MG tablet Take 40 mg by mouth daily. Do not crush.   . potassium chloride SA (KLOR-CON) 20 MEQ tablet Take 20 mEq by mouth daily. DX hypokalemia   . QUEtiapine (SEROQUEL) 25 MG tablet Take 12.5 mg by mouth 2 (two) times daily. Special Instructions: delusions; hallucinations    No facility-administered encounter medications on file  as of 08/11/2020.     SIGNIFICANT DIAGNOSTIC EXAMS  PREVIOUS ;   07-22-19: right forearm x-ray  1. Impacted fracture of the radial neck junction. 2. Suspect additional nondisplaced fracture of the coronoid process. 3. Right oval swelling and trace joint effusion. 4. Degenerative arthrosis of the wrist and elbow. Enthesopathic changes of the olecranon.  07-22-19: right shoulder x-ray  1. The osseous structures appear diffusely demineralized which may limit detection of small or nondisplaced fractures. 2. No acute  fracture or traumatic malalignment. 3. High-riding appearance of the humeral head likely reflecting rotator cuff insufficiency. 4. Moderate osteoarthrosis of the acromioclavicular and glenohumeral joints.  03-21-20: chest x-ray: Mild congestive heart failure   NO NEW EXAMS.   LABS REVIEWED PREVIOUS:    09-06-19: wbc 4.4; hgb 10.8; hct 34.9; mcv 84.7 plt 261; glucose 81; bun 26; creat 1.21; k+ 3.5; na++ 141; ca 9.1 liver normal albumin 3.5    02-24-20: wbc 4.9; hgb 9.5; hct 30.0 mcv 82.2 plt 160; glucose 82; bun 49; creat 1.54; k+ 3.4; na++ 140; ca 8.7 liver normal albumin 3.4 02-27-20: glucose 84; bun 41; creat 1.29; k+ 3.9; na++ 142; ca 9.0  03-12-20: glucose 87; bun 45; creat 1.26; k+ 3.7; na++ 141; ca 9.0 03-18-20: tsh 1.548 vit B 12: 529 03-30-20: glucose 88; bun 26; creat 1.33; k+ 2.9; na++ 140 ca 9.0  03-31-20: k+ 3.6 07-08-20: wbc 4.3; hgb 10.9; hct 34.6; mcv 90.3 plt 190; glucose 89; bun 36; creat 1.42; k+ 3.8; na++ 142; ca 9.3 liver normal albumin 3.4 folate 14.7; vit B 12: 541 tsh 1.548  NO NEW LABS.    Review of Systems  Constitutional: Negative for malaise/fatigue.  Respiratory: Negative for cough and shortness of breath.   Cardiovascular: Negative for chest pain, palpitations and leg swelling.  Gastrointestinal: Negative for abdominal pain, constipation and heartburn.  Musculoskeletal: Negative for back pain, joint pain and myalgias.  Skin: Negative.    Neurological: Negative for dizziness.  Psychiatric/Behavioral: The patient is not nervous/anxious.     Physical Exam Constitutional:      General: She is not in acute distress.    Appearance: She is well-developed and well-nourished. She is not diaphoretic.  Neck:     Thyroid: No thyromegaly.  Cardiovascular:     Rate and Rhythm: Regular rhythm. Bradycardia present.     Pulses: Normal pulses and intact distal pulses.     Heart sounds: Normal heart sounds.  Pulmonary:     Effort: Pulmonary effort is normal. No respiratory distress.     Breath sounds: Normal breath sounds.  Abdominal:     General: Bowel sounds are normal. There is no distension.     Palpations: Abdomen is soft.     Tenderness: There is no abdominal tenderness.  Musculoskeletal:        General: No edema. Normal range of motion.     Cervical back: Neck supple.     Right lower leg: No edema.     Left lower leg: No edema.  Lymphadenopathy:     Cervical: No cervical adenopathy.  Skin:    General: Skin is warm and dry.     Comments: Bilateral lower extremities discolored       Neurological:     Mental Status: She is alert. Mental status is at baseline.  Psychiatric:        Mood and Affect: Mood and affect and mood normal.        ASSESSMENT/ PLAN:  TODAY  1. Late onset alzheimer's disease with behavioral disturbance 2. Major depression with psychotic features.   She is presently stable will continue seroquel 12.5 mg twice daily and ativan 0.25 mg twice daily. At this time lowering these medications will more than likely cause her to suffer mental and emotional harm   MD is aware of resident's narcotic use and is in agreement with current plan of care. We will attempt to wean resident as appropriate.  Ok Edwards NP [facility]  Contact [phone]  Monday through Friday 8am- 5pm  After hours call [phone]   

## 2020-08-13 ENCOUNTER — Non-Acute Institutional Stay (SKILLED_NURSING_FACILITY): Payer: Medicare Other | Admitting: Adult Health

## 2020-08-13 ENCOUNTER — Encounter: Payer: Self-pay | Admitting: Adult Health

## 2020-08-13 DIAGNOSIS — G301 Alzheimer's disease with late onset: Secondary | ICD-10-CM

## 2020-08-13 DIAGNOSIS — N1832 Chronic kidney disease, stage 3b: Secondary | ICD-10-CM | POA: Diagnosis not present

## 2020-08-13 DIAGNOSIS — F411 Generalized anxiety disorder: Secondary | ICD-10-CM | POA: Diagnosis not present

## 2020-08-13 DIAGNOSIS — F323 Major depressive disorder, single episode, severe with psychotic features: Secondary | ICD-10-CM

## 2020-08-13 DIAGNOSIS — F0281 Dementia in other diseases classified elsewhere with behavioral disturbance: Secondary | ICD-10-CM | POA: Diagnosis not present

## 2020-08-13 NOTE — Progress Notes (Signed)
Location:  [facility] Room Number: 122/D Place of Service:  SNF (31)   CODE STATUS: DNR  Allergies  Allergen Reactions  . Bee Venom Anaphylaxis  . Ace Inhibitors   . Angiotensin Receptor Blockers     Tongue swelling.  . Aspirin     Directed by physician  . Cabbage Other (See Comments)    Certain foods due to gout flares  . Latex Rash    Chief Complaint  Patient presents with  . Acute Visit    Care Plan Meeting     HPI:  We have come together for her care plan meeting. Family present. BIMS 15/15 mood 0/30. She requires limited to extensive assist with adls. Is incontinent of bladder; frequently incontinent of bowel. No recent falls. Her appetite is variable and she does feed herself. Her weight is stable at 127 pounds. She is spending more time in bed per her choice. There are no reports of uncontrolled pain. She continues to be followed for her chronic illnesses including: Late onset alzheimer's dementia with behavioral disturbance Stage 3b chronic kidney disease  Major depression with psychotic features  Past Medical History:  Diagnosis Date  . Anxiety   . Cellulitis 2016  . CHF (congestive heart failure) ([place])   . Chronic kidney disease   . Dementia ([place])   . Dysphagia   . Fracture of radial neck, right, closed 08/06/2019  . Gait abnormality   . GERD (gastroesophageal reflux disease)   . Glaucoma   . Gout, unspecified 01/12/2010   Qualifier: Diagnosis of  By: Moshe Cipro MD, Joycelyn Schmid    . Hyperlipidemia   . Hypertension   . Muscle weakness   . Myocardial infarction [facility]) [place]  . Osteoarthritis   . Syncope     Past Surgical History:  Procedure Laterality Date  . APPENDECTOMY    . CATARACT EXTRACTION W/PHACO  08/04/2011   Procedure: CATARACT EXTRACTION PHACO AND INTRAOCULAR LENS PLACEMENT (IOC);  Surgeon: Tonny Branch;  Location: [facility] OR[facility];  Service: Ophthalmology;  Laterality: Right;  CDE=18.53  . CATARACT EXTRACTION W/PHACO   08/25/2011   Procedure: CATARACT EXTRACTION PHACO AND INTRAOCULAR LENS PLACEMENT (IOC);  Surgeon: Tonny Branch;  Location: [facility] ORS;  Service: Ophthalmology;  Laterality: Left;  CDE:14.76  . TONSILLECTOMY      Social History   Socioeconomic History  . Marital status: Divorced    Spouse name: Not on file  . Number of children: Not on file  . Years of education: Not on file  . Highest education level: Not on file  Occupational History  . Occupation: retired   Tobacco Use  . Smoking status: Never Smoker  . Smokeless tobacco: Never Used  Vaping Use  . Vaping Use: Never used  Substance and Sexual Activity  . Alcohol use: No  . Drug use: No  . Sexual activity: Not Currently  Other Topics Concern  . Not on file  Social History Narrative   Long term resident [facility]    Social Determinants of Health   Financial Resource Strain: Not on file  Food Insecurity: Not on file  Transportation Needs: Not on file  Physical Activity: Not on file  Stress: Not on file  Social Connections: Not on file  Intimate Partner Violence: Not on file   Family History  Problem Relation Age of Onset  . Diabetes Father   . Diabetes Sister   . Diabetes Brother   . Stroke Sister   . Stroke Sister   .  Anesthesia problems Neg Hx   . Hypotension Neg Hx   . Malignant hyperthermia Neg Hx   . Pseudochol deficiency Neg Hx       VITAL SIGNS BP (!) 152/58   Pulse 60   Temp 97.8 F (36.6 C)   Resp 16   Ht 4\' 11"  (1.499 m)   Wt 135 lb (61.2 kg)   SpO2 96%   BMI 27.27 kg/m   Outpatient Encounter Medications as of 08/13/2020  Medication Sig  . acetaminophen (TYLENOL) 500 MG tablet Take 1,000 mg by mouth every 8 (eight) hours as needed. For leg pain and arthritis. May have additional 1000 mg for break through pain once a day prn  . alum & mag hydroxide-simeth (MAALOX PLUS) 400-400-40 MG/5ML suspension Amt: 4 teaspoons; oral Special Instructions: for indigestion/heartburn Twice A Day - PRN PRN 1, PRN 2   . Alum & Mag Hydroxide-Simeth (MYLANTA PO) Take by mouth. liquid; regular; amt: 4 tsp.; oral Special Instructions: After Breakfast once a day 9:00 Am  . Balsam [place]-Castor Oil (VENELEX) OINT Apply topically to sacrum and bilateral buttocks every shift and as needed  . Brinzolamide-Brimonidine 1-0.2 % SUSP One  drop into both eyes three times a day  . Calcium Carbonate-Vitamin D (CALCIUM-VITAMIN D) 500-200 MG-UNIT tablet Take 1 tablet by mouth 2 (two) times daily.  . citalopram (CELEXA) 10 MG tablet Take 30 mg by mouth daily.   . cycloSPORINE (RESTASIS) 0.05 % ophthalmic emulsion Place 1 drop into both eyes 2 (two) times daily.   Marland Kitchen Dextromethorphan-guaiFENesin (ROBITUSSIN DM PO) 15 ml by mouth every 6 hours prn for cough/congestion. May give up to 48 hrs. Inform physician immediately if cough or congestion is associated with fever or SOB Every 6 Hours - PRN  . docusate sodium (COLACE) 100 MG capsule Take 100 mg by mouth daily.   . furosemide (LASIX) 40 MG tablet Take 40 mg by mouth daily.   . hydrALAZINE (APRESOLINE) 50 MG tablet Take 50 mg by mouth 2 (two) times a day.   . ipratropium-albuterol (DUONEB) 0.5-2.5 (3) MG/3ML SOLN Take 3 mLs by nebulization every 6 (six) hours as needed.  . latanoprost (XALATAN) 0.005 % ophthalmic solution Place 1 drop into both eyes at bedtime.  Marland Kitchen loratadine (CLARITIN) 10 MG tablet Take 10 mg by mouth daily.  Marland Kitchen LORazepam (ATIVAN) 0.5 MG tablet Take 0.5 tablets (0.25 mg total) by mouth 2 (two) times daily.  . nitroGLYCERIN (NITROSTAT) 0.4 MG SL tablet Place 0.4 mg under the tongue every 5 (five) minutes as needed for chest pain.  . NON FORMULARY Diet Type:  Regular  . OXYGEN Inhale 2 L/min into the lungs continuous. Wean to keep sats above 90%  . pantoprazole (PROTONIX) 40 MG tablet Take 40 mg by mouth daily. Do not crush.  . potassium chloride SA (KLOR-CON) 20 MEQ tablet Take 20 mEq by mouth daily. DX hypokalemia  . QUEtiapine (SEROQUEL) 25 MG tablet Take 12.5  mg by mouth 2 (two) times daily. Special Instructions: delusions; hallucinations   No facility-administered encounter medications on file as of 08/13/2020.     SIGNIFICANT DIAGNOSTIC EXAMS   PREVIOUS ;   07-22-19: right forearm x-ray  1. Impacted fracture of the radial neck junction. 2. Suspect additional nondisplaced fracture of the coronoid process. 3. Right oval swelling and trace joint effusion. 4. Degenerative arthrosis of the wrist and elbow. Enthesopathic changes of the olecranon.  07-22-19: right shoulder x-ray  1. The osseous structures appear diffusely demineralized which may limit  detection of small or nondisplaced fractures. 2. No acute fracture or traumatic malalignment. 3. High-riding appearance of the humeral head likely reflecting rotator cuff insufficiency. 4. Moderate osteoarthrosis of the acromioclavicular and glenohumeral joints.  03-21-20: chest x-ray: Mild congestive heart failure   NO NEW EXAMS.   LABS REVIEWED PREVIOUS:    09-06-19: wbc 4.4; hgb 10.8; hct 34.9; mcv 84.7 plt 261; glucose 81; bun 26; creat 1.21; k+ 3.5; na++ 141; ca 9.1 liver normal albumin 3.5    02-24-20: wbc 4.9; hgb 9.5; hct 30.0 mcv 82.2 plt 160; glucose 82; bun 49; creat 1.54; k+ 3.4; na++ 140; ca 8.7 liver normal albumin 3.4 02-27-20: glucose 84; bun 41; creat 1.29; k+ 3.9; na++ 142; ca 9.0  03-12-20: glucose 87; bun 45; creat 1.26; k+ 3.7; na++ 141; ca 9.0 03-18-20: tsh 1.548 vit B 12: 529 03-30-20: glucose 88; bun 26; creat 1.33; k+ 2.9; na++ 140 ca 9.0  03-31-20: k+ 3.6 07-08-20: wbc 4.3; hgb 10.9; hct 34.6; mcv 90.3 plt 190; glucose 89; bun 36; creat 1.42; k+ 3.8; na++ 142; ca 9.3 liver normal albumin 3.4 folate 14.7; vit B 12: 541 tsh 1.548  NO NEW LABS.    Review of Systems  Constitutional: Negative for malaise/fatigue.  Respiratory: Negative for cough and shortness of breath.   Cardiovascular: Negative for chest pain, palpitations and leg swelling.  Gastrointestinal: Negative for  abdominal pain, constipation and heartburn.  Musculoskeletal: Negative for back pain, joint pain and myalgias.  Skin: Negative.   Neurological: Negative for dizziness.  Psychiatric/Behavioral: The patient is not nervous/anxious.     Physical Exam Constitutional:      General: She is not in acute distress.    Appearance: She is well-developed and well-nourished. She is not diaphoretic.  Neck:     Thyroid: No thyromegaly.  Cardiovascular:     Rate and Rhythm: Regular rhythm. Bradycardia present.     Pulses: Normal pulses and intact distal pulses.     Heart sounds: Normal heart sounds.  Pulmonary:     Effort: Pulmonary effort is normal. No respiratory distress.     Breath sounds: Normal breath sounds.  Abdominal:     General: Bowel sounds are normal. There is no distension.     Palpations: Abdomen is soft.     Tenderness: There is no abdominal tenderness.  Musculoskeletal:        General: No edema. Normal range of motion.     Cervical back: Neck supple.     Right lower leg: No edema.     Left lower leg: No edema.  Lymphadenopathy:     Cervical: No cervical adenopathy.  Skin:    General: Skin is warm and dry.     Comments: Bilateral lower extremities discolored        Neurological:     Mental Status: She is alert. Mental status is at baseline.  Psychiatric:        Mood and Affect: Mood and affect and mood normal.      ASSESSMENT/ PLAN:  TODAY  1. Late onset alzheimer's dementia with behavioral disturbance 2. Stage 3b chronic kidney disease 3. Major depression with psychotic features.   Will continue current medications Will continue current plan of care Will continue to monitor her status.   MD is aware of resident's narcotic use and is in agreement with current plan of care. We will attempt to wean resident as appropriate.  Ok Edwards NP [facility]  Contact [phone] Monday through Friday 8am- 5pm  After hours call [phone]

## 2020-08-20 DIAGNOSIS — F411 Generalized anxiety disorder: Secondary | ICD-10-CM | POA: Diagnosis not present

## 2020-08-26 DIAGNOSIS — Z1159 Encounter for screening for other viral diseases: Secondary | ICD-10-CM | POA: Diagnosis not present

## 2020-08-26 DIAGNOSIS — I130 Hypertensive heart and chronic kidney disease with heart failure and stage 1 through stage 4 chronic kidney disease, or unspecified chronic kidney disease: Secondary | ICD-10-CM | POA: Diagnosis not present

## 2020-08-26 DIAGNOSIS — M79601 Pain in right arm: Secondary | ICD-10-CM | POA: Diagnosis not present

## 2020-08-27 ENCOUNTER — Encounter: Payer: Self-pay | Admitting: Adult Health

## 2020-08-27 ENCOUNTER — Other Ambulatory Visit (HOSPITAL_COMMUNITY)
Admission: RE | Admit: 2020-08-27 | Discharge: 2020-08-27 | Disposition: A | Discharge status: Home / Self Care | Payer: Medicare Other | Source: Skilled Nursing Facility | Attending: Internal Medicine | Admitting: Internal Medicine

## 2020-08-27 ENCOUNTER — Non-Acute Institutional Stay (SKILLED_NURSING_FACILITY): Payer: Medicare Other | Admitting: Adult Health

## 2020-08-27 DIAGNOSIS — I130 Hypertensive heart and chronic kidney disease with heart failure and stage 1 through stage 4 chronic kidney disease, or unspecified chronic kidney disease: Secondary | ICD-10-CM | POA: Diagnosis not present

## 2020-08-27 DIAGNOSIS — I5032 Chronic diastolic (congestive) heart failure: Secondary | ICD-10-CM

## 2020-08-27 DIAGNOSIS — Z23 Encounter for immunization: Secondary | ICD-10-CM | POA: Diagnosis not present

## 2020-08-27 LAB — BASIC METABOLIC PANEL
Anion gap: 10 (ref 5–15)
BUN: 30 mg/dL — ABNORMAL HIGH (ref 8–23)
CO2: 35 mmol/L — ABNORMAL HIGH (ref 22–32)
Calcium: 9.1 mg/dL (ref 8.9–10.3)
Chloride: 97 mmol/L — ABNORMAL LOW (ref 98–111)
Creatinine, Ser: 1.33 mg/dL — ABNORMAL HIGH (ref 0.44–1.00)
GFR, Estimated: 37 mL/min — ABNORMAL LOW (ref >60)
Glucose, Bld: 94 mg/dL (ref 70–99)
Potassium: 3.7 mmol/L (ref 3.5–5.1)
Sodium: 142 mmol/L (ref 135–145)

## 2020-08-27 NOTE — Progress Notes (Signed)
Location:  [facility] Room Number: [id number] Place of Service:  SNF (31)   CODE STATUS: dnr  Allergies  Allergen Reactions  . Bee Venom Anaphylaxis  . Ace Inhibitors   . Angiotensin Receptor Blockers     Tongue swelling.  . Aspirin     Directed by physician  . Cabbage Other (See Comments)    Certain foods due to gout flares  . Latex Rash    Chief Complaint  Patient presents with  . Acute Visit    Weight gain     HPI:  She is gaining weight from 07-29-20: 121.4 pounds to 08-21-20: 133.6 pounds. There are no reports of cough or shortness of breath; no reports of chest pain. She does spend more time in bed. Her appetite is good. She is presently taking lasix 40 mg daily   Past Medical History:  Diagnosis Date  . Anxiety   . Cellulitis 2016  . CHF (congestive heart failure) ([place])   . Chronic kidney disease   . Dementia ([place])   . Dysphagia   . Fracture of radial neck, right, closed 08/06/2019  . Gait abnormality   . GERD (gastroesophageal reflux disease)   . Glaucoma   . Gout, unspecified 01/12/2010   Qualifier: Diagnosis of  By: Moshe Cipro MD, Joycelyn Schmid    . Hyperlipidemia   . Hypertension   . Muscle weakness   . Myocardial infarction [facility]) [place]  . Osteoarthritis   . Syncope     Past Surgical History:  Procedure Laterality Date  . APPENDECTOMY    . CATARACT EXTRACTION W/PHACO  08/04/2011   Procedure: CATARACT EXTRACTION PHACO AND INTRAOCULAR LENS PLACEMENT (IOC);  Surgeon: Tonny Branch;  Location: [facility];  Service: Ophthalmology;  Laterality: Right;  CDE=18.53  . CATARACT EXTRACTION W/PHACO  08/25/2011   Procedure: CATARACT EXTRACTION PHACO AND INTRAOCULAR LENS PLACEMENT (IOC);  Surgeon: Tonny Branch;  Location: [facility];  Service: Ophthalmology;  Laterality: Left;  CDE:14.76  . TONSILLECTOMY      Social History   Socioeconomic History  . Marital status: Divorced    Spouse name: Not on file  . Number of children: Not on file  . Years  of education: Not on file  . Highest education level: Not on file  Occupational History  . Occupation: retired   Tobacco Use  . Smoking status: Never Smoker  . Smokeless tobacco: Never Used  Vaping Use  . Vaping Use: Never used  Substance and Sexual Activity  . Alcohol use: No  . Drug use: No  . Sexual activity: Not Currently  Other Topics Concern  . Not on file  Social History Narrative   Long term resident [facility]    Social Determinants of Health   Financial Resource Strain: Not on file  Food Insecurity: Not on file  Transportation Needs: Not on file  Physical Activity: Not on file  Stress: Not on file  Social Connections: Not on file  Intimate Partner Violence: Not on file   Family History  Problem Relation Age of Onset  . Diabetes Father   . Diabetes Sister   . Diabetes Brother   . Stroke Sister   . Stroke Sister   . Anesthesia problems Neg Hx   . Hypotension Neg Hx   . Malignant hyperthermia Neg Hx   . Pseudochol deficiency Neg Hx       VITAL SIGNS BP (!) 148/80   Pulse (!) 58   Resp 18  Ht 4\' 11"  (1.499 m)   Wt 133 lb 9.6 oz (60.6 kg)   BMI 26.98 kg/m   Outpatient Encounter Medications as of 08/27/2020  Medication Sig  . acetaminophen (TYLENOL) 500 MG tablet Take 1,000 mg by mouth every 8 (eight) hours as needed. For leg pain and arthritis. May have additional 1000 mg for break through pain once a day prn  . alum & mag hydroxide-simeth (MAALOX PLUS) 400-400-40 MG/5ML suspension Amt: 4 teaspoons; oral Special Instructions: for indigestion/heartburn Twice A Day - PRN PRN 1, PRN 2  . Alum & Mag Hydroxide-Simeth (MYLANTA PO) Take by mouth. liquid; regular; amt: 4 tsp.; oral Special Instructions: After Breakfast once a day 9:00 Am  . Balsam [place]-Castor Oil (VENELEX) OINT Apply topically to sacrum and bilateral buttocks every shift and as needed  . Brinzolamide-Brimonidine 1-0.2 % SUSP One  drop into both eyes three times a day  . Calcium Carbonate-Vitamin  D (CALCIUM-VITAMIN D) 500-200 MG-UNIT tablet Take 1 tablet by mouth 2 (two) times daily.  . citalopram (CELEXA) 10 MG tablet Take 30 mg by mouth daily.   . cycloSPORINE (RESTASIS) 0.05 % ophthalmic emulsion Place 1 drop into both eyes 2 (two) times daily.   08/29/2020 Dextromethorphan-guaiFENesin (ROBITUSSIN DM PO) 15 ml by mouth every 6 hours prn for cough/congestion. May give up to 48 hrs. Inform physician immediately if cough or congestion is associated with fever or SOB Every 6 Hours - PRN  . docusate sodium (COLACE) 100 MG capsule Take 100 mg by mouth daily.   . furosemide (LASIX) 40 MG tablet Take 40 mg by mouth daily.   . hydrALAZINE (APRESOLINE) 50 MG tablet Take 50 mg by mouth 2 (two) times a day.   . ipratropium-albuterol (DUONEB) 0.5-2.5 (3) MG/3ML SOLN Take 3 mLs by nebulization every 6 (six) hours as needed.  . latanoprost (XALATAN) 0.005 % ophthalmic solution Place 1 drop into both eyes at bedtime.  Marland Kitchen loratadine (CLARITIN) 10 MG tablet Take 10 mg by mouth daily.  Marland Kitchen LORazepam (ATIVAN) 0.5 MG tablet Take 0.5 tablets (0.25 mg total) by mouth 2 (two) times daily.  . nitroGLYCERIN (NITROSTAT) 0.4 MG SL tablet Place 0.4 mg under the tongue every 5 (five) minutes as needed for chest pain.  . NON FORMULARY Diet Type:  Regular  . OXYGEN Inhale 2 L/min into the lungs continuous. Wean to keep sats above 90%  . pantoprazole (PROTONIX) 40 MG tablet Take 40 mg by mouth daily. Do not crush.  . potassium chloride SA (KLOR-CON) 20 MEQ tablet Take 20 mEq by mouth daily. DX hypokalemia  . QUEtiapine (SEROQUEL) 25 MG tablet Take 12.5 mg by mouth 2 (two) times daily. Special Instructions: delusions; hallucinations   No facility-administered encounter medications on file as of 08/27/2020.     SIGNIFICANT DIAGNOSTIC EXAMS    PREVIOUS ;   07-22-19: right forearm x-ray  1. Impacted fracture of the radial neck junction. 2. Suspect additional nondisplaced fracture of the coronoid process. 3. Right oval  swelling and trace joint effusion. 4. Degenerative arthrosis of the wrist and elbow. Enthesopathic changes of the olecranon.  07-22-19: right shoulder x-ray  1. The osseous structures appear diffusely demineralized which may limit detection of small or nondisplaced fractures. 2. No acute fracture or traumatic malalignment. 3. High-riding appearance of the humeral head likely reflecting rotator cuff insufficiency. 4. Moderate osteoarthrosis of the acromioclavicular and glenohumeral joints.  03-21-20: chest x-ray: Mild congestive heart failure   NO NEW EXAMS.   LABS REVIEWED PREVIOUS:  09-06-19: wbc 4.4; hgb 10.8; hct 34.9; mcv 84.7 plt 261; glucose 81; bun 26; creat 1.21; k+ 3.5; na++ 141; ca 9.1 liver normal albumin 3.5    02-24-20: wbc 4.9; hgb 9.5; hct 30.0 mcv 82.2 plt 160; glucose 82; bun 49; creat 1.54; k+ 3.4; na++ 140; ca 8.7 liver normal albumin 3.4 02-27-20: glucose 84; bun 41; creat 1.29; k+ 3.9; na++ 142; ca 9.0  03-12-20: glucose 87; bun 45; creat 1.26; k+ 3.7; na++ 141; ca 9.0 03-18-20: tsh 1.548 vit B 12: 529 03-30-20: glucose 88; bun 26; creat 1.33; k+ 2.9; na++ 140 ca 9.0  03-31-20: k+ 3.6 07-08-20: wbc 4.3; hgb 10.9; hct 34.6; mcv 90.3 plt 190; glucose 89; bun 36; creat 1.42; k+ 3.8; na++ 142; ca 9.3 liver normal albumin 3.4 folate 14.7; vit B 12: 541 tsh 1.548  TODAY  08-27-20: glucose 94; bun 30; creat 1.33; k+ 3.7; na++ 142; ca 9.1 GFR 37  Review of Systems  Constitutional: Negative for malaise/fatigue.  Respiratory: Negative for cough and shortness of breath.   Cardiovascular: Negative for chest pain, palpitations and leg swelling.  Gastrointestinal: Negative for abdominal pain, constipation and heartburn.  Musculoskeletal: Negative for back pain, joint pain and myalgias.  Skin: Negative.   Neurological: Negative for dizziness.  Psychiatric/Behavioral: The patient is not nervous/anxious.     Physical Exam Constitutional:      General: She is not in acute  distress.    Appearance: She is well-developed and well-nourished. She is not diaphoretic.  Neck:     Thyroid: No thyromegaly.  Cardiovascular:     Rate and Rhythm: Regular rhythm. Bradycardia present.     Pulses: Normal pulses and intact distal pulses.     Heart sounds: Normal heart sounds.  Pulmonary:     Effort: Pulmonary effort is normal. No respiratory distress.     Breath sounds: Normal breath sounds.  Abdominal:     General: Bowel sounds are normal. There is no distension.     Palpations: Abdomen is soft.     Tenderness: There is no abdominal tenderness.  Musculoskeletal:        General: No edema. Normal range of motion.     Cervical back: Neck supple.     Right lower leg: No edema.     Left lower leg: No edema.  Lymphadenopathy:     Cervical: No cervical adenopathy.  Skin:    General: Skin is warm and dry.     Comments: Bilateral lower extremities discolored  Neurological:     Mental Status: She is alert. Mental status is at baseline.     Comments: 04-17-20: BCAT 14/45  Psychiatric:        Mood and Affect: Mood and affect and mood normal.     ASSESSMENT/ PLAN:  TODAY  1. Chronic diastolic CHF (congestive heart failure) Is worse Will begin lasix 40 mg twice daily and k+ 20 meq twice daily through 08-30-20 Will check BMP 08-31-20 Will monitor her status.     MD is aware of resident's narcotic use and is in agreement with current plan of care. We will attempt to wean resident as appropriate.  Ok Edwards NP [facility]  Contact [phone] Monday through Friday 8am- 5pm  After hours call [phone]

## 2020-08-28 ENCOUNTER — Other Ambulatory Visit (HOSPITAL_COMMUNITY)
Admission: RE | Admit: 2020-08-28 | Discharge: 2020-08-28 | Disposition: A | Discharge status: Home / Self Care | Payer: Medicare Other | Source: Skilled Nursing Facility | Attending: Adult Health | Admitting: Adult Health

## 2020-08-28 ENCOUNTER — Encounter: Payer: Self-pay | Admitting: Internal Medicine

## 2020-08-28 DIAGNOSIS — U071 COVID-19: Secondary | ICD-10-CM | POA: Diagnosis not present

## 2020-08-28 LAB — D-DIMER, QUANTITATIVE: D-Dimer, Quant: 2.24 ug/mL-FEU — ABNORMAL HIGH (ref 0.00–0.50)

## 2020-08-28 LAB — C-REACTIVE PROTEIN: CRP: 0.8 mg/dL (ref <1.0)

## 2020-08-31 ENCOUNTER — Other Ambulatory Visit (HOSPITAL_COMMUNITY)
Admission: RE | Admit: 2020-08-31 | Discharge: 2020-08-31 | Disposition: A | Discharge status: Home / Self Care | Payer: Medicare Other | Attending: Adult Health | Admitting: Adult Health

## 2020-08-31 DIAGNOSIS — I130 Hypertensive heart and chronic kidney disease with heart failure and stage 1 through stage 4 chronic kidney disease, or unspecified chronic kidney disease: Secondary | ICD-10-CM | POA: Diagnosis present

## 2020-08-31 LAB — BASIC METABOLIC PANEL
Anion gap: 10 (ref 5–15)
BUN: 40 mg/dL — ABNORMAL HIGH (ref 8–23)
CO2: 34 mmol/L — ABNORMAL HIGH (ref 22–32)
Calcium: 9 mg/dL (ref 8.9–10.3)
Chloride: 94 mmol/L — ABNORMAL LOW (ref 98–111)
Creatinine, Ser: 1.33 mg/dL — ABNORMAL HIGH (ref 0.44–1.00)
GFR, Estimated: 37 mL/min — ABNORMAL LOW (ref >60)
Glucose, Bld: 127 mg/dL — ABNORMAL HIGH (ref 70–99)
Potassium: 4.1 mmol/L (ref 3.5–5.1)
Sodium: 138 mmol/L (ref 135–145)

## 2020-08-31 LAB — D-DIMER, QUANTITATIVE: D-Dimer, Quant: 0.95 ug/mL-FEU — ABNORMAL HIGH (ref 0.00–0.50)

## 2020-09-07 ENCOUNTER — Other Ambulatory Visit: Payer: Self-pay | Admitting: Adult Health

## 2020-09-07 MED ORDER — LORAZEPAM 0.5 MG PO TABS: 0.2500 mg | ORAL_TABLET | Freq: Two times a day (BID) | ORAL | 0 refills | Status: DC

## 2020-09-09 ENCOUNTER — Encounter: Payer: Self-pay | Admitting: Adult Health

## 2020-09-09 ENCOUNTER — Non-Acute Institutional Stay (SKILLED_NURSING_FACILITY): Payer: Medicare Other | Admitting: Adult Health

## 2020-09-09 DIAGNOSIS — N1832 Chronic kidney disease, stage 3b: Secondary | ICD-10-CM

## 2020-09-09 DIAGNOSIS — I5032 Chronic diastolic (congestive) heart failure: Secondary | ICD-10-CM | POA: Diagnosis not present

## 2020-09-09 DIAGNOSIS — I130 Hypertensive heart and chronic kidney disease with heart failure and stage 1 through stage 4 chronic kidney disease, or unspecified chronic kidney disease: Secondary | ICD-10-CM | POA: Diagnosis not present

## 2020-09-09 DIAGNOSIS — J3089 Other allergic rhinitis: Secondary | ICD-10-CM | POA: Diagnosis not present

## 2020-09-09 DIAGNOSIS — K219 Gastro-esophageal reflux disease without esophagitis: Secondary | ICD-10-CM

## 2020-09-09 NOTE — Progress Notes (Signed)
Location:  [facility] Room Number: 122/D Place of Service:  SNF (31)   CODE STATUS: DNR  Allergies  Allergen Reactions  . Bee Venom Anaphylaxis  . Ace Inhibitors   . Angiotensin Receptor Blockers     Tongue swelling.  . Aspirin     Directed by physician  . Cabbage Other (See Comments)    Certain foods due to gout flares  . Latex Rash    Chief Complaint  Patient presents with  . Medical Management of Chronic Issues         Chronic non-seasonal allergic rhinitis:     Hypertensive heart and kidney disease with chronic diastolic congestive heart failure and stage 3 chronic kidney disease  GERD without esophagitis      HPI:  She is a 87 year old long term resident of this facility being seen for the management of her chronic illnesses: Chronic non-seasonal allergic rhinitis:     Hypertensive heart and kidney disease with chronic diastolic congestive heart failure and stage 2 chronic kidney disease  GERD without esophagitis. There are no reports of heart burn; no constipation; no reports of uncontrolled pain.   Past Medical History:  Diagnosis Date  . Anxiety   . Cellulitis 2016  . CHF (congestive heart failure) ([place])   . Chronic kidney disease   . Dementia ([place])   . Dysphagia   . Fracture of radial neck, right, closed 08/06/2019  . Gait abnormality   . GERD (gastroesophageal reflux disease)   . Glaucoma   . Gout, unspecified 01/12/2010   Qualifier: Diagnosis of  By: Moshe Cipro MD, Joycelyn Schmid    . Hyperlipidemia   . Hypertension   . Muscle weakness   . Myocardial infarction [facility] [place]  . Osteoarthritis   . Syncope     Past Surgical History:  Procedure Laterality Date  . APPENDECTOMY    . CATARACT EXTRACTION W/PHACO  08/04/2011   Procedure: CATARACT EXTRACTION PHACO AND INTRAOCULAR LENS PLACEMENT (IOC);  Surgeon: Tonny Branch;  Location: [facility];  Service: Ophthalmology;  Laterality: Right;  CDE=18.53  . CATARACT EXTRACTION W/PHACO   08/25/2011   Procedure: CATARACT EXTRACTION PHACO AND INTRAOCULAR LENS PLACEMENT (IOC);  Surgeon: Tonny Branch;  Location: [facility];  Service: Ophthalmology;  Laterality: Left;  CDE:14.76  . TONSILLECTOMY      Social History   Socioeconomic History  . Marital status: Divorced    Spouse name: Not on file  . Number of children: Not on file  . Years of education: Not on file  . Highest education level: Not on file  Occupational History  . Occupation: retired   Tobacco Use  . Smoking status: Never Smoker  . Smokeless tobacco: Never Used  Vaping Use  . Vaping Use: Never used  Substance and Sexual Activity  . Alcohol use: No  . Drug use: No  . Sexual activity: Not Currently  Other Topics Concern  . Not on file  Social History Narrative   Long term resident [facility]    Social Determinants of Health   Financial Resource Strain: Not on file  Food Insecurity: Not on file  Transportation Needs: Not on file  Physical Activity: Not on file  Stress: Not on file  Social Connections: Not on file  Intimate Partner Violence: Not on file   Family History  Problem Relation Age of Onset  . Diabetes Father   . Diabetes Sister   . Diabetes Brother   . Stroke Sister   .  Stroke Sister   . Anesthesia problems Neg Hx   . Hypotension Neg Hx   . Malignant hyperthermia Neg Hx   . Pseudochol deficiency Neg Hx       VITAL SIGNS BP 132/74   Pulse 78   Temp 97.9 F (36.6 C)   Ht 4\' 11"  (1.499 m)   Wt 134 lb (60.8 kg)   BMI 27.06 kg/m   Outpatient Encounter Medications as of 09/09/2020  Medication Sig  . acetaminophen (TYLENOL) 500 MG tablet Take 1,000 mg by mouth every 8 (eight) hours as needed. For leg pain and arthritis. May have additional 1000 mg for break through pain once a day prn  . alum & mag hydroxide-simeth (MAALOX PLUS) 400-400-40 MG/5ML suspension Amt: 4 teaspoons; oral Special Instructions: for indigestion/heartburn Twice A Day - PRN PRN 1, PRN 2  . Alum & Mag  Hydroxide-Simeth (MYLANTA PO) Take by mouth. liquid; regular; amt: 4 tsp.; oral Special Instructions: After Breakfast once a day 9:00 Am  . apixaban (ELIQUIS) 2.5 MG TABS tablet Take 2.5 mg by mouth 2 (two) times daily.  Roseanne Kaufman [place]-Castor Oil (VENELEX) OINT Apply topically to sacrum and bilateral buttocks every shift and as needed  . Brinzolamide-Brimonidine 1-0.2 % SUSP One  drop into both eyes three times a day  . Calcium Carbonate-Vitamin D (CALCIUM-VITAMIN D) 500-200 MG-UNIT tablet Take 1 tablet by mouth 2 (two) times daily.  . citalopram (CELEXA) 10 MG tablet Take 30 mg by mouth daily.   . cycloSPORINE (RESTASIS) 0.05 % ophthalmic emulsion Place 1 drop into both eyes 2 (two) times daily.   Marland Kitchen Dextromethorphan-guaiFENesin (ROBITUSSIN DM PO) 15 ml by mouth every 6 hours prn for cough/congestion. May give up to 48 hrs. Inform physician immediately if cough or congestion is associated with fever or SOB Every 6 Hours - PRN  . docusate sodium (COLACE) 100 MG capsule Take 100 mg by mouth daily.   . furosemide (LASIX) 40 MG tablet Take 40 mg by mouth daily.   . hydrALAZINE (APRESOLINE) 50 MG tablet Take 50 mg by mouth 2 (two) times a day.   . ipratropium-albuterol (DUONEB) 0.5-2.5 (3) MG/3ML SOLN Take 3 mLs by nebulization every 6 (six) hours as needed.  . latanoprost (XALATAN) 0.005 % ophthalmic solution Place 1 drop into both eyes at bedtime.  Marland Kitchen loratadine (CLARITIN) 10 MG tablet Take 10 mg by mouth daily.  Marland Kitchen LORazepam (ATIVAN) 0.5 MG tablet Take 0.5 tablets (0.25 mg total) by mouth 2 (two) times daily.  . nitroGLYCERIN (NITROSTAT) 0.4 MG SL tablet Place 0.4 mg under the tongue every 5 (five) minutes as needed for chest pain.  . NON FORMULARY Diet Type:  Regular  . OXYGEN Inhale 2 L/min into the lungs continuous. Wean to keep sats above 90%  . pantoprazole (PROTONIX) 40 MG tablet Take 40 mg by mouth daily. Do not crush.  . potassium chloride SA (KLOR-CON) 20 MEQ tablet Take 20 mEq by mouth  daily. DX hypokalemia  . QUEtiapine (SEROQUEL) 25 MG tablet Take 12.5 mg by mouth 2 (two) times daily. Special Instructions: delusions; hallucinations   No facility-administered encounter medications on file as of 09/09/2020.     SIGNIFICANT DIAGNOSTIC EXAMS   PREVIOUS ;   07-22-19: right forearm x-ray  1. Impacted fracture of the radial neck junction. 2. Suspect additional nondisplaced fracture of the coronoid process. 3. Right oval swelling and trace joint effusion. 4. Degenerative arthrosis of the wrist and elbow. Enthesopathic changes of the olecranon.  07-22-19: right  shoulder x-ray  1. The osseous structures appear diffusely demineralized which may limit detection of small or nondisplaced fractures. 2. No acute fracture or traumatic malalignment. 3. High-riding appearance of the humeral head likely reflecting rotator cuff insufficiency. 4. Moderate osteoarthrosis of the acromioclavicular and glenohumeral joints.  03-21-20: chest x-ray: Mild congestive heart failure   NO NEW EXAMS.   LABS REVIEWED PREVIOUS:    09-06-19: wbc 4.4; hgb 10.8; hct 34.9; mcv 84.7 plt 261; glucose 81; bun 26; creat 1.21; k+ 3.5; na++ 141; ca 9.1 liver normal albumin 3.5    02-24-20: wbc 4.9; hgb 9.5; hct 30.0 mcv 82.2 plt 160; glucose 82; bun 49; creat 1.54; k+ 3.4; na++ 140; ca 8.7 liver normal albumin 3.4 02-27-20: glucose 84; bun 41; creat 1.29; k+ 3.9; na++ 142; ca 9.0  03-12-20: glucose 87; bun 45; creat 1.26; k+ 3.7; na++ 141; ca 9.0 03-18-20: tsh 1.548 vit B 12: 529 03-30-20: glucose 88; bun 26; creat 1.33; k+ 2.9; na++ 140 ca 9.0  03-31-20: k+ 3.6 07-08-20: wbc 4.3; hgb 10.9; hct 34.6; mcv 90.3 plt 190; glucose 89; bun 36; creat 1.42; k+ 3.8; na++ 142; ca 9.3 liver normal albumin 3.4 folate 14.7; vit B 12: 541 tsh 1.548 08-27-20: glucose 94; bun 30; creat 1.33; k+ 3.7; na++ 142; ca 9.1 GFR 37  NO NEW LABS.   Review of Systems  Constitutional: Negative for malaise/fatigue.  Respiratory: Negative  for cough and shortness of breath.   Cardiovascular: Negative for chest pain, palpitations and leg swelling.  Gastrointestinal: Negative for abdominal pain, constipation and heartburn.  Musculoskeletal: Negative for back pain, joint pain and myalgias.  Skin: Negative.   Neurological: Negative for dizziness.  Psychiatric/Behavioral: The patient is not nervous/anxious.      Physical Exam Constitutional:      General: She is not in acute distress.    Appearance: She is well-developed and well-nourished. She is not diaphoretic.  Neck:     Thyroid: No thyromegaly.  Cardiovascular:     Rate and Rhythm: Regular rhythm. Bradycardia present.     Pulses: Normal pulses and intact distal pulses.     Heart sounds: Normal heart sounds.  Pulmonary:     Effort: Pulmonary effort is normal. No respiratory distress.     Breath sounds: Normal breath sounds.  Abdominal:     General: Bowel sounds are normal. There is no distension.     Palpations: Abdomen is soft.     Tenderness: There is no abdominal tenderness.  Musculoskeletal:        General: No edema. Normal range of motion.     Cervical back: Neck supple.     Right lower leg: No edema.     Left lower leg: No edema.     Comments: Bilateral lower extremities discolored   Lymphadenopathy:     Cervical: No cervical adenopathy.  Skin:    General: Skin is warm and dry.  Neurological:     Mental Status: She is alert. Mental status is at baseline.     Comments: 04-17-20: BCAT 14/45  Psychiatric:        Mood and Affect: Mood and affect and mood normal.       ASSESSMENT/ PLAN:  TODAY:   1. Chronic non-seasonal allergic rhinitis: is stable will continue claritin 10 mg daily   2. Hypertensive heart and kidney disease with chronic diastolic congestive heart failure and stage 2 chronic kidney disease is stable b/p 132/74 will continue apresoline 50 mg twice daily and lasix  4-0 mg daily   3. GERD without esophagitis: is stable will continue  protonix 40 mg daily   PREVIOUS   4. Chronic respiratory failure with hypoxia on home 02 therapy: is stable will continue claritin 10 mg daily and is on 02  5. Increased intraocular pressure bilateral is stable will continue simbrinza three times daily travatan nightly to both eyes.   6. Hypokalemia: is stable will continue k+ 20 meq daily   7. Primary osteoarthritis multiple joints is stable will continue tylenol 1 gm twice daily   8. Late onset alzheimer's disease with behavioral disturbance: is stable is off aricept will monitor   9. Major depression with psychotic features is worse; will continue celexa 30 mg daily (has failed one wean from celexa)  ativan 0.25 mg twice daily (has failed 2 weans from ativan) will begin seroquel 12.5 mg nightly   10. CKD (chronic kidney disease) stage 3 GFR 30-59 ml/min is stable bun 36; creat 1.42   11. Chronic constipation: is stable will continue colace twice daily  12. Chronic diastolic CHF (congestive heart failure) EF 65-70% (03-04-18) is stable will continue apresoline 50 mg twice daily lasix 40 mg daily has prn ntg  13. Deep vein thrombosis (DVT) of left lower extremity unspecified chronicity unspecified vein: is stable will continue to monitor her status.   MD is aware of resident's narcotic use and is in agreement with current plan of care. We will attempt to wean resident as appropriate.  Ok Edwards NP [facility]  Contact [phone] Monday through Friday 8am- 5pm  After hours call [phone]

## 2020-10-06 ENCOUNTER — Other Ambulatory Visit: Payer: Self-pay | Admitting: Adult Health

## 2020-10-06 MED ORDER — LORAZEPAM 0.5 MG PO TABS: 0.2500 mg | ORAL_TABLET | Freq: Two times a day (BID) | ORAL | 0 refills | Status: DC

## 2020-10-14 ENCOUNTER — Encounter: Payer: Self-pay | Admitting: Internal Medicine

## 2020-10-14 ENCOUNTER — Non-Acute Institutional Stay (SKILLED_NURSING_FACILITY): Payer: Medicare Other | Admitting: Internal Medicine

## 2020-10-14 DIAGNOSIS — N1832 Chronic kidney disease, stage 3b: Secondary | ICD-10-CM | POA: Diagnosis not present

## 2020-10-14 DIAGNOSIS — J9611 Chronic respiratory failure with hypoxia: Secondary | ICD-10-CM | POA: Diagnosis not present

## 2020-10-14 DIAGNOSIS — F323 Major depressive disorder, single episode, severe with psychotic features: Secondary | ICD-10-CM

## 2020-10-14 DIAGNOSIS — D649 Anemia, unspecified: Secondary | ICD-10-CM | POA: Diagnosis not present

## 2020-10-14 DIAGNOSIS — Z9981 Dependence on supplemental oxygen: Secondary | ICD-10-CM

## 2020-10-14 DIAGNOSIS — I10 Essential (primary) hypertension: Secondary | ICD-10-CM

## 2020-10-14 DIAGNOSIS — I82402 Acute embolism and thrombosis of unspecified deep veins of left lower extremity: Secondary | ICD-10-CM

## 2020-10-14 NOTE — Assessment & Plan Note (Signed)
Anemia has improved with increase in H/H from 9.5/30-10.9/34.6

## 2020-10-14 NOTE — Assessment & Plan Note (Signed)
Adequate oxygenation on low-flow oxygen with O2 sats of 93% on 2 L.

## 2020-10-14 NOTE — Assessment & Plan Note (Signed)
08/31/2020 creatinine 1.33/GFR 37 indicating CKD stage IIIb. Avoid nephrotoxic medications.

## 2020-10-14 NOTE — Progress Notes (Signed)
NURSING HOME LOCATION:  [facility] ROOM NUMBER: 122/D   CODE STATUS:  DNR  PCP:  Gerlene Fee, NP  This is a nursing facility follow up visit of chronic medical diagnoses & to document compliance with Regulation 483.30 (c) in [employer] Manual Phase 2 which mandates caregiver visit ( visits can alternate among physician, PA or NP as per statutes) within 10 days of 30 days / 60 days/ 90 days post admission to SNF date    Interim medical record and care since last [place] visit was updated with review of diagnostic studies and change in clinical status since last visit were documented.  HPI: She is a permanent resident of facility with medical diagnoses of history of syncope, history of MI, essential hypertension, dyslipidemia, history of gout, history of glaucoma, GERD, dementia, CKD, history of CHF, and history of anxiety. She has had no significant surgeries. The extensive family history is noncontributory due to her advanced age. Not surprisingly she has never smoked or drunk alcohol.  Review of systems: Dementia invalidated responses.  Speech is somewhat garbled.  She seems to indicate that she used to have shortness of breath but not now.  She does validate that she has "gouch" in her feet.  Constitutional: No fever, significant weight change, fatigue  Eyes: No redness, discharge, pain, vision change ENT/mouth: No nasal congestion,  purulent discharge, earache, change in hearing, sore throat  Cardiovascular: No chest pain, palpitations, paroxysmal nocturnal dyspnea, claudication, edema  Respiratory: No cough, sputum production, hemoptysis, DOE, significant snoring, apnea   Gastrointestinal: No heartburn, dysphagia, abdominal pain, nausea /vomiting, rectal bleeding, melena, change in bowels Genitourinary: No dysuria, hematuria, pyuria, incontinence, nocturia Dermatologic: No rash, pruritus, change in appearance of skin Neurologic:  No dizziness, headache, syncope, seizures, numbness, tingling Psychiatric: No significant anxiety, depression, insomnia, anorexia Endocrine: No change in hair/skin/nails, excessive thirst, excessive hunger, excessive urination  Hematologic/lymphatic: No significant bruising, lymphadenopathy, abnormal bleeding Allergy/immunology: No itchy/watery eyes, significant sneezing, urticaria, angioedema  Physical exam:  Pertinent or positive findings: She appears her stated age.  She is wearing nasal oxygen.  Arcus senilis is present.  She is completely edentulous.  As noted speech is somewhat garbled.  She has low-grade rales asymmetrically greater in the left anterior chest.  Second heart sound is slightly increased.  Pedal pulses are decreased.  She has trace edema.  She has keratotic changes over the shins.  Slight clubbing of the nailbeds is present.  The right thumbnail and index nail are thickened and discolored.  She is fairly strong to opposition but the upper extremities seem stronger than the lower extremities.  The right extremities appear slightly stronger than the left.  General appearance:  no acute distress, increased work of breathing is present.   Lymphatic: No lymphadenopathy about the head, neck, axilla. Eyes: No conjunctival inflammation or lid edema is present. There is no scleral icterus. Ears:  External ear exam shows no significant lesions or deformities.   Nose:  External nasal examination shows no deformity or inflammation. Nasal mucosa are pink and moist without lesions, exudates Neck:  No thyromegaly, masses, tenderness noted.    Heart:  No gallop, murmur, click, rub .  Lungs: without wheezes, rhonchi, rubs. Abdomen: Bowel sounds are normal. Abdomen is soft and nontender with no organomegaly, hernias, masses. GU: Deferred  Extremities:  No cyanosis  Neurologic exam :Balance, Rhomberg, finger to nose testing could not be completed due to clinical state Skin: Warm &  dry w/o  tenting.  See summary under each active problem in the Problem List with associated updated therapeutic plan

## 2020-10-14 NOTE — Patient Instructions (Signed)
See assessment and plan under each diagnosis in the problem list and acutely for this visit 

## 2020-10-14 NOTE — Assessment & Plan Note (Signed)
Clinically resolved.  

## 2020-10-14 NOTE — Assessment & Plan Note (Signed)
Clinically she was not depressed today.  She was interactive although communication was limited by her garbled speech.

## 2020-10-16 NOTE — Assessment & Plan Note (Signed)
BP average & medication compliance will be verified before starting new med or adjusting Hydralazine dosage.Marland Kitchen

## 2020-10-29 ENCOUNTER — Encounter: Payer: Self-pay | Admitting: Adult Health

## 2020-10-29 ENCOUNTER — Other Ambulatory Visit: Payer: Self-pay | Admitting: Adult Health

## 2020-10-29 ENCOUNTER — Non-Acute Institutional Stay (SKILLED_NURSING_FACILITY): Payer: Medicare Other | Admitting: Adult Health

## 2020-10-29 DIAGNOSIS — I5032 Chronic diastolic (congestive) heart failure: Secondary | ICD-10-CM | POA: Diagnosis not present

## 2020-10-29 DIAGNOSIS — F323 Major depressive disorder, single episode, severe with psychotic features: Secondary | ICD-10-CM

## 2020-10-29 DIAGNOSIS — G301 Alzheimer's disease with late onset: Secondary | ICD-10-CM

## 2020-10-29 DIAGNOSIS — F0281 Dementia in other diseases classified elsewhere with behavioral disturbance: Secondary | ICD-10-CM | POA: Diagnosis not present

## 2020-10-29 MED ORDER — LORAZEPAM 0.5 MG PO TABS: 0.2500 mg | ORAL_TABLET | Freq: Two times a day (BID) | ORAL | 0 refills | Status: DC

## 2020-10-29 NOTE — Progress Notes (Signed)
Location:  [place] Room Number: 122/D Place of Service:  SNF (31)   CODE STATUS: DNR  Allergies  Allergen Reactions  . Bee Venom Anaphylaxis  . Ace Inhibitors   . Angiotensin Receptor Blockers     Tongue swelling.  . Aspirin     Directed by physician  . Cabbage Other (See Comments)    Certain foods due to gout flares  . Latex Rash    Chief Complaint  Patient presents with  . Acute Visit    Care Plan Meeting     HPI:  We have come together for her care plan meeting:  BIMS 13/15 mood 0/30. She requires extensive to dependent for her adls. Is able to feed herself. She is incontinent of bowel and bladder. She is non-ambulatory. There have been no falls. Weight 131 pounds; appetite 25-75% of meals. She continues to be followed for her chronic illnesses including: Chronic diastolic CHF (congestive heart failure)   Late onset alzheimer's disease with behavioral disturbance  Major depression with psychosis   Past Medical History:  Diagnosis Date  . Anxiety   . Cellulitis 2016  . CHF (congestive heart failure) ([place])   . Chronic kidney disease   . Dementia ([place])   . Dysphagia   . Fracture of radial neck, right, closed 08/06/2019  . Gait abnormality   . GERD (gastroesophageal reflux disease)   . Glaucoma   . Gout, unspecified 01/12/2010   Qualifier: Diagnosis of  By: Moshe Cipro MD, Joycelyn Schmid    . Hyperlipidemia   . Hypertension   . Muscle weakness   . Myocardial infarction [facility]) [place]  . Osteoarthritis   . Syncope     Past Surgical History:  Procedure Laterality Date  . APPENDECTOMY    . CATARACT EXTRACTION W/PHACO  08/04/2011   Procedure: CATARACT EXTRACTION PHACO AND INTRAOCULAR LENS PLACEMENT (IOC);  Surgeon: Tonny Branch;  Location: [facility] OR[facility];  Service: Ophthalmology;  Laterality: Right;  CDE=18.53  . CATARACT EXTRACTION W/PHACO  08/25/2011   Procedure: CATARACT EXTRACTION PHACO AND INTRAOCULAR LENS PLACEMENT (IOC);  Surgeon: Tonny Branch;   Location: [facility] ORS;  Service: Ophthalmology;  Laterality: Left;  CDE:14.76  . TONSILLECTOMY      Social History   Socioeconomic History  . Marital status: Divorced    Spouse name: Not on file  . Number of children: Not on file  . Years of education: Not on file  . Highest education level: Not on file  Occupational History  . Occupation: retired   Tobacco Use  . Smoking status: Never Smoker  . Smokeless tobacco: Never Used  Vaping Use  . Vaping Use: Never used  Substance and Sexual Activity  . Alcohol use: No  . Drug use: No  . Sexual activity: Not Currently  Other Topics Concern  . Not on file  Social History Narrative   Long term resident [facility]    Social Determinants of Health   Financial Resource Strain: Not on file  Food Insecurity: Not on file  Transportation Needs: Not on file  Physical Activity: Not on file  Stress: Not on file  Social Connections: Not on file  Intimate Partner Violence: Not on file   Family History  Problem Relation Age of Onset  . Diabetes Father   . Diabetes Sister   . Diabetes Brother   . Stroke Sister   . Stroke Sister   . Anesthesia problems Neg Hx   . Hypotension Neg Hx   .  Malignant hyperthermia Neg Hx   . Pseudochol deficiency Neg Hx       VITAL SIGNS BP (!) 155/61   Pulse 60   Temp 98 F (36.7 C)   Resp 17   Ht 4\' 11"  (1.499 m)   Wt 132 lb (59.9 kg)   SpO2 95%   BMI 26.66 kg/m   Outpatient Encounter Medications as of 10/29/2020  Medication Sig  . acetaminophen (TYLENOL) 500 MG tablet Take 1,000 mg by mouth every 8 (eight) hours as needed. For leg pain and arthritis. May have additional 1000 mg for break through pain once a day prn  . alum & mag hydroxide-simeth (MAALOX PLUS) 400-400-40 MG/5ML suspension Amt: 4 teaspoons; oral Special Instructions: for indigestion/heartburn Twice A Day - PRN PRN 1, PRN 2  . Alum & Mag Hydroxide-Simeth (MYLANTA PO) Take by mouth. amt: 4 tsp.; oral Special Instructions: after  breakfast Once A Day 09:00 AM  . Balsam [place]-Castor Oil (VENELEX) OINT Apply topically to sacrum and bilateral buttocks every shift and as needed  . Brinzolamide-Brimonidine 1-0.2 % SUSP One  drop into both eyes three times a day  . Calcium Carbonate-Vitamin D (CALCIUM-VITAMIN D) 500-200 MG-UNIT tablet Take 1 tablet by mouth 2 (two) times daily.  . citalopram (CELEXA) 10 MG tablet Take 30 mg by mouth daily.   . cycloSPORINE (RESTASIS) 0.05 % ophthalmic emulsion Place 1 drop into both eyes 2 (two) times daily.   Marland Kitchen Dextromethorphan-guaiFENesin (ROBITUSSIN DM PO) 15 ml by mouth every 6 hours prn for cough/congestion. May give up to 48 hrs. Inform physician immediately if cough or congestion is associated with fever or SOB Every 6 Hours - PRN  . docusate sodium (COLACE) 100 MG capsule Take 100 mg by mouth daily.   . furosemide (LASIX) 40 MG tablet Take 40 mg by mouth daily.   . hydrALAZINE (APRESOLINE) 50 MG tablet Take 50 mg by mouth 2 (two) times a day.   . ipratropium-albuterol (DUONEB) 0.5-2.5 (3) MG/3ML SOLN Take 3 mLs by nebulization every 6 (six) hours as needed.  . latanoprost (XALATAN) 0.005 % ophthalmic solution Place 1 drop into both eyes at bedtime.  Marland Kitchen loratadine (CLARITIN) 10 MG tablet Take 10 mg by mouth daily.  Marland Kitchen LORazepam (ATIVAN) 0.5 MG tablet Take 0.5 tablets (0.25 mg total) by mouth 2 (two) times daily.  . nitroGLYCERIN (NITROSTAT) 0.4 MG SL tablet Place 0.4 mg under the tongue every 5 (five) minutes as needed for chest pain.  . NON FORMULARY Diet Type:  Regular  . OXYGEN Inhale 2 L/min into the lungs continuous. Wean to keep sats above 90%  . pantoprazole (PROTONIX) 40 MG tablet Take 40 mg by mouth daily. Do not crush.  . potassium chloride SA (KLOR-CON) 20 MEQ tablet Take 20 mEq by mouth daily. DX hypokalemia  . QUEtiapine (SEROQUEL) 25 MG tablet Take 12.5 mg by mouth 2 (two) times daily. Special Instructions: delusions; hallucinations  . [DISCONTINUED] magnesium hydroxide  (MILK OF MAGNESIA) 400 MG/5ML suspension Take 30 mLs by mouth daily as needed for mild constipation.   No facility-administered encounter medications on file as of 10/29/2020.     SIGNIFICANT DIAGNOSTIC EXAMS  PREVIOUS ;   03-21-20: chest x-ray: Mild congestive heart failure   NO NEW EXAMS.   LABS REVIEWED PREVIOUS:    02-24-20: wbc 4.9; hgb 9.5; hct 30.0 mcv 82.2 plt 160; glucose 82; bun 49; creat 1.54; k+ 3.4; na++ 140; ca 8.7 liver normal albumin 3.4 02-27-20: glucose 84; bun 41; creat  1.29; k+ 3.9; na++ 142; ca 9.0  03-12-20: glucose 87; bun 45; creat 1.26; k+ 3.7; na++ 141; ca 9.0 03-18-20: tsh 1.548 vit B 12: 529 03-30-20: glucose 88; bun 26; creat 1.33; k+ 2.9; na++ 140 ca 9.0  03-31-20: k+ 3.6 07-08-20: wbc 4.3; hgb 10.9; hct 34.6; mcv 90.3 plt 190; glucose 89; bun 36; creat 1.42; k+ 3.8; na++ 142; ca 9.3 liver normal albumin 3.4 folate 14.7; vit B 12: 541 tsh 1.548 08-27-20: glucose 94; bun 30; creat 1.33; k+ 3.7; na++ 142; ca 9.1 GFR 37  NO NEW LABS.   Review of Systems  Constitutional: Negative for malaise/fatigue.  Respiratory: Negative for cough and shortness of breath.   Cardiovascular: Negative for chest pain, palpitations and leg swelling.  Gastrointestinal: Negative for abdominal pain, constipation and heartburn.  Musculoskeletal: Negative for back pain, joint pain and myalgias.  Skin: Negative.   Neurological: Negative for dizziness.  Psychiatric/Behavioral: The patient is not nervous/anxious.    Physical Exam Constitutional:      General: She is not in acute distress.    Appearance: She is well-developed and well-nourished. She is not diaphoretic.  Neck:     Thyroid: No thyromegaly.  Cardiovascular:     Rate and Rhythm: Regular rhythm. Bradycardia present.     Pulses: Intact distal pulses.     Heart sounds: Normal heart sounds.  Pulmonary:     Effort: Pulmonary effort is normal. No respiratory distress.     Breath sounds: Normal breath sounds.  Abdominal:      General: Bowel sounds are normal. There is no distension.     Palpations: Abdomen is soft.     Tenderness: There is no abdominal tenderness.  Musculoskeletal:        General: No edema. Normal range of motion.     Cervical back: Neck supple.     Right lower leg: No edema.     Left lower leg: No edema.  Lymphadenopathy:     Cervical: No cervical adenopathy.  Skin:    General: Skin is warm and dry.     Comments: Bilateral lower extremities discolored  Neurological:     Mental Status: She is alert. Mental status is at baseline.     Comments: 04-17-20: BCAT 14/45   Psychiatric:        Mood and Affect: Mood and affect and mood normal.       ASSESSMENT/ PLAN:  TODAY  1. Chronic diastolic CHF (congestive heart failure) 2. Late onset alzheimer's disease with behavioral disturbance 3. Major depression with psychosis   Will continue current medications Will continue current plan of care Will continue to monitor her status.    Ok Edwards NP [facility]  Contact [phone] Monday through Friday 8am- 5pm  After hours call [phone]

## 2020-11-09 ENCOUNTER — Encounter: Payer: Self-pay | Admitting: Adult Health

## 2020-11-09 ENCOUNTER — Non-Acute Institutional Stay (SKILLED_NURSING_FACILITY): Payer: Medicare Other | Admitting: Adult Health

## 2020-11-09 DIAGNOSIS — H40053 Ocular hypertension, bilateral: Secondary | ICD-10-CM | POA: Diagnosis not present

## 2020-11-09 DIAGNOSIS — E876 Hypokalemia: Secondary | ICD-10-CM | POA: Diagnosis not present

## 2020-11-09 DIAGNOSIS — J9611 Chronic respiratory failure with hypoxia: Secondary | ICD-10-CM

## 2020-11-09 DIAGNOSIS — Z9981 Dependence on supplemental oxygen: Secondary | ICD-10-CM | POA: Diagnosis not present

## 2020-11-09 NOTE — Progress Notes (Signed)
Location:  [place] Room Number: 122/D Place of Service:  SNF (31)   CODE STATUS: DNR  Allergies  Allergen Reactions  . Bee Venom Anaphylaxis  . Ace Inhibitors   . Angiotensin Receptor Blockers     Tongue swelling.  . Aspirin     Directed by physician  . Cabbage Other (See Comments)    Certain foods due to gout flares  . Latex Rash    Chief Complaint  Patient presents with  . Medical Management of Chronic Issues         Chronic respiratory failure with hypoxia on home 02 therapy:    Increased intraocular pressure bilateral  Hypokalemia:     HPI:  She is a 87 year old long term resident of this facility being seen for the management of her chronic illnesses: Chronic respiratory failure with hypoxia on home 02 therapy:    Increased intraocular pressure bilateral  Hypokalemia. There are no reports of uncontrolled pain. No cough; no shortness of breath.   Past Medical History:  Diagnosis Date  . Anxiety   . Cellulitis 2016  . CHF (congestive heart failure) ([place])   . Chronic kidney disease   . Dementia ([place])   . Dysphagia   . Fracture of radial neck, right, closed 08/06/2019  . Gait abnormality   . GERD (gastroesophageal reflux disease)   . Glaucoma   . Gout, unspecified 01/12/2010   Qualifier: Diagnosis of  By: Moshe Cipro MD, Joycelyn Schmid    . Hyperlipidemia   . Hypertension   . Muscle weakness   . Myocardial infarction [facility]) [place]  . Osteoarthritis   . Syncope     Past Surgical History:  Procedure Laterality Date  . APPENDECTOMY    . CATARACT EXTRACTION W/PHACO  08/04/2011   Procedure: CATARACT EXTRACTION PHACO AND INTRAOCULAR LENS PLACEMENT (IOC);  Surgeon: Tonny Branch;  Location: [facility];  Service: Ophthalmology;  Laterality: Right;  CDE=18.53  . CATARACT EXTRACTION W/PHACO  08/25/2011   Procedure: CATARACT EXTRACTION PHACO AND INTRAOCULAR LENS PLACEMENT (IOC);  Surgeon: Tonny Branch;  Location: [facility];  Service: Ophthalmology;   Laterality: Left;  CDE:14.76  . TONSILLECTOMY      Social History   Socioeconomic History  . Marital status: Divorced    Spouse name: Not on file  . Number of children: Not on file  . Years of education: Not on file  . Highest education level: Not on file  Occupational History  . Occupation: retired   Tobacco Use  . Smoking status: Never Smoker  . Smokeless tobacco: Never Used  Vaping Use  . Vaping Use: Never used  Substance and Sexual Activity  . Alcohol use: No  . Drug use: No  . Sexual activity: Not Currently  Other Topics Concern  . Not on file  Social History Narrative   Long term resident [facility]    Social Determinants of Health   Financial Resource Strain: Not on file  Food Insecurity: Not on file  Transportation Needs: Not on file  Physical Activity: Not on file  Stress: Not on file  Social Connections: Not on file  Intimate Partner Violence: Not on file   Family History  Problem Relation Age of Onset  . Diabetes Father   . Diabetes Sister   . Diabetes Brother   . Stroke Sister   . Stroke Sister   . Anesthesia problems Neg Hx   . Hypotension Neg Hx   . Malignant hyperthermia Neg Hx   .  Pseudochol deficiency Neg Hx       VITAL SIGNS BP (!) 157/63   Pulse (!) 57   Temp (!) 97.2 F (36.2 C)   Resp 17   Ht 4\' 11"  (1.499 m)   Wt 131 lb 12.8 oz (59.8 kg)   SpO2 95%   BMI 26.62 kg/m   Outpatient Encounter Medications as of 11/09/2020  Medication Sig  . acetaminophen (TYLENOL) 500 MG tablet Take 1,000 mg by mouth every 8 (eight) hours as needed. For leg pain and arthritis. May have additional 1000 mg for break through pain once a day prn  . alum & mag hydroxide-simeth (MAALOX PLUS) 400-400-40 MG/5ML suspension Amt: 4 teaspoons; oral Special Instructions: for indigestion/heartburn Twice A Day - PRN PRN 1, PRN 2  . Alum & Mag Hydroxide-Simeth (MYLANTA PO) Take by mouth. amt: 4 tsp.; oral Special Instructions: after breakfast Once A Day 09:00 AM  .  Balsam [place]-Castor Oil (VENELEX) OINT Apply topically to sacrum and bilateral buttocks every shift and as needed  . Brinzolamide-Brimonidine 1-0.2 % SUSP One  drop into both eyes three times a day  . Calcium Carbonate-Vitamin D (CALCIUM-VITAMIN D) 500-200 MG-UNIT tablet Take 1 tablet by mouth 2 (two) times daily.  . citalopram (CELEXA) 10 MG tablet Take 30 mg by mouth daily.   . cycloSPORINE (RESTASIS) 0.05 % ophthalmic emulsion Place 1 drop into both eyes 2 (two) times daily.   Marland Kitchen Dextromethorphan-guaiFENesin (ROBITUSSIN DM PO) 15 ml by mouth every 6 hours prn for cough/congestion. May give up to 48 hrs. Inform physician immediately if cough or congestion is associated with fever or SOB Every 6 Hours - PRN  . docusate sodium (COLACE) 100 MG capsule Take 100 mg by mouth daily.   . furosemide (LASIX) 40 MG tablet Take 40 mg by mouth daily.   . hydrALAZINE (APRESOLINE) 50 MG tablet Take 50 mg by mouth 2 (two) times a day.   . ipratropium-albuterol (DUONEB) 0.5-2.5 (3) MG/3ML SOLN Take 3 mLs by nebulization every 6 (six) hours as needed.  . latanoprost (XALATAN) 0.005 % ophthalmic solution Place 1 drop into both eyes at bedtime.  Marland Kitchen loratadine (CLARITIN) 10 MG tablet Take 10 mg by mouth daily.  Marland Kitchen LORazepam (ATIVAN) 0.5 MG tablet Take 0.5 tablets (0.25 mg total) by mouth 2 (two) times daily.  . nitroGLYCERIN (NITROSTAT) 0.4 MG SL tablet Place 0.4 mg under the tongue every 5 (five) minutes as needed for chest pain.  . NON FORMULARY Diet Type:  Regular  . OXYGEN Inhale 2 L/min into the lungs continuous. Wean to keep sats above 90%  . pantoprazole (PROTONIX) 40 MG tablet Take 40 mg by mouth daily. Do not crush.  . potassium chloride SA (KLOR-CON) 20 MEQ tablet Take 20 mEq by mouth daily. DX hypokalemia  . QUEtiapine (SEROQUEL) 25 MG tablet Take 12.5 mg by mouth 2 (two) times daily. Special Instructions: delusions; hallucinations   No facility-administered encounter medications on file as of 11/09/2020.      SIGNIFICANT DIAGNOSTIC EXAMS   PREVIOUS ;   07-22-19: right forearm x-ray  1. Impacted fracture of the radial neck junction. 2. Suspect additional nondisplaced fracture of the coronoid process. 3. Right oval swelling and trace joint effusion. 4. Degenerative arthrosis of the wrist and elbow. Enthesopathic changes of the olecranon.  07-22-19: right shoulder x-ray  1. The osseous structures appear diffusely demineralized which may limit detection of small or nondisplaced fractures. 2. No acute fracture or traumatic malalignment. 3. High-riding appearance of the  humeral head likely reflecting rotator cuff insufficiency. 4. Moderate osteoarthrosis of the acromioclavicular and glenohumeral joints.  03-21-20: chest x-ray: Mild congestive heart failure   NO NEW EXAMS.   LABS REVIEWED PREVIOUS:    02-24-20: wbc 4.9; hgb 9.5; hct 30.0 mcv 82.2 plt 160; glucose 82; bun 49; creat 1.54; k+ 3.4; na++ 140; ca 8.7 liver normal albumin 3.4 02-27-20: glucose 84; bun 41; creat 1.29; k+ 3.9; na++ 142; ca 9.0  03-12-20: glucose 87; bun 45; creat 1.26; k+ 3.7; na++ 141; ca 9.0 03-18-20: tsh 1.548 vit B 12: 529 03-30-20: glucose 88; bun 26; creat 1.33; k+ 2.9; na++ 140 ca 9.0  03-31-20: k+ 3.6 07-08-20: wbc 4.3; hgb 10.9; hct 34.6; mcv 90.3 plt 190; glucose 89; bun 36; creat 1.42; k+ 3.8; na++ 142; ca 9.3 liver normal albumin 3.4 folate 14.7; vit B 12: 541 tsh 1.548 08-27-20: glucose 94; bun 30; creat 1.33; k+ 3.7; na++ 142; ca 9.1 GFR 37  TODAY  08-31-20: glucose 127; bun 40; creat 1.33; k+ 4.1; na++ 138; ca 9.0; GFR 37   Review of Systems  Constitutional: Negative for malaise/fatigue.  Respiratory: Negative for cough and shortness of breath.   Cardiovascular: Negative for chest pain, palpitations and leg swelling.  Gastrointestinal: Negative for abdominal pain, constipation and heartburn.  Musculoskeletal: Negative for back pain, joint pain and myalgias.  Skin: Negative.   Neurological: Negative  for dizziness.  Psychiatric/Behavioral: The patient is not nervous/anxious.     Physical Exam Constitutional:      General: She is not in acute distress.    Appearance: She is well-developed. She is not diaphoretic.  Neck:     Thyroid: No thyromegaly.  Cardiovascular:     Rate and Rhythm: Regular rhythm. Bradycardia present.     Pulses: Normal pulses.     Heart sounds: Normal heart sounds.  Pulmonary:     Effort: Pulmonary effort is normal. No respiratory distress.     Breath sounds: Normal breath sounds.  Abdominal:     General: Bowel sounds are normal. There is no distension.     Palpations: Abdomen is soft.     Tenderness: There is no abdominal tenderness.  Musculoskeletal:        General: Normal range of motion.     Cervical back: Neck supple.     Right lower leg: No edema.     Left lower leg: No edema.  Lymphadenopathy:     Cervical: No cervical adenopathy.  Skin:    General: Skin is warm and dry.     Comments: Bilateral lower extremities discolored   Neurological:     Mental Status: She is alert. Mental status is at baseline.     Comments:  04-17-20: BCAT 14/45    Psychiatric:        Mood and Affect: Mood normal.     ASSESSMENT/ PLAN:  TODAY:   1. Chronic respiratory failure with hypoxia on home 02 therapy: is stable will continue 02; claritin 10 mg daily   2. Increased intraocular pressure bilateral is stable will continue simbrinza three times daily travatan night both to both eyes  3. Hypokalemia: is stable k+ 4.1 will continue k+ 20 meq daily    PREVIOUS   4. Primary osteoarthritis multiple joints is stable will continue tylenol 1 gm twice daily   5. Late onset alzheimer's disease with behavioral disturbance: is stable is off aricept will monitor   6. Major depression with psychotic features is worse; will continue celexa 30 mg  daily (has failed one wean from celexa)  ativan 0.25 mg twice daily (has failed 2 weans from ativan) will begin seroquel 12.5  mg nightly   7. CKD (chronic kidney disease) stage 3 GFR 30-59 ml/min is stable bun 36; creat 1.42   8. Chronic constipation: is stable will continue colace twice daily  9. Chronic diastolic CHF (congestive heart failure) EF 65-70% (03-04-18) is stable will continue apresoline 50 mg twice daily lasix 40 mg daily has prn ntg  10. Deep vein thrombosis (DVT) of left lower extremity unspecified chronicity unspecified vein: is stable will continue to monitor her status.   11 Chronic non-seasonal allergic rhinitis: is stable will continue claritin 10 mg daily   12. Hypertensive heart and kidney disease with chronic diastolic congestive heart failure and stage 3 chronic kidney disease is stable b/p 157/63 will continue apresoline 50 mg twice daily and lasix 4-0 mg daily   13. GERD without esophagitis: is stable will continue protonix 40 mg daily       Ok Edwards NP [facility]  Contact [phone] Monday through Friday 8am- 5pm  After hours call [phone]

## 2020-11-30 ENCOUNTER — Other Ambulatory Visit: Payer: Self-pay | Admitting: Adult Health

## 2020-11-30 MED ORDER — LORAZEPAM 0.5 MG PO TABS: 0.2500 mg | ORAL_TABLET | Freq: Two times a day (BID) | ORAL | 0 refills | Status: DC

## 2020-12-15 ENCOUNTER — Non-Acute Institutional Stay (SKILLED_NURSING_FACILITY): Payer: Medicare Other | Admitting: Adult Health

## 2020-12-15 ENCOUNTER — Encounter: Payer: Self-pay | Admitting: Adult Health

## 2020-12-15 DIAGNOSIS — G301 Alzheimer's disease with late onset: Secondary | ICD-10-CM

## 2020-12-15 DIAGNOSIS — M170 Bilateral primary osteoarthritis of knee: Secondary | ICD-10-CM | POA: Diagnosis not present

## 2020-12-15 DIAGNOSIS — N1832 Chronic kidney disease, stage 3b: Secondary | ICD-10-CM

## 2020-12-15 DIAGNOSIS — F0281 Dementia in other diseases classified elsewhere with behavioral disturbance: Secondary | ICD-10-CM

## 2020-12-15 DIAGNOSIS — F323 Major depressive disorder, single episode, severe with psychotic features: Secondary | ICD-10-CM | POA: Diagnosis not present

## 2020-12-15 NOTE — Progress Notes (Signed)
Location:  [facility] Room Number: 983 Place of Service:  SNF (31)   CODE STATUS: dnr  Allergies  Allergen Reactions  . Bee Venom Anaphylaxis  . Ace Inhibitors   . Angiotensin Receptor Blockers     Tongue swelling.  . Aspirin     Directed by physician  . Cabbage Other (See Comments)    Certain foods due to gout flares  . Latex Rash    Chief Complaint  Patient presents with  . Medical Management of Chronic Issues           Primary osteoarthritis multiple joints:    Late onset alzheimer's disease with behavioral disturbance:    Major depression with psychotic features   CKD (chronic kidney disease) stage 3 GFR 30-59    HPI:  She is a 88 year old long term resident of this facility being seen for the management of her chronic illnesses: Primary osteoarthritis multiple joints:    Late onset alzheimer's disease with behavioral disturbance:    Major depression with psychotic features   CKD (chronic kidney disease) stage 3 GFR 30-59.   There are no reports of uncontrolled pain; no reports of agitation; no reports of anxiety or insomnia.   Past Medical History:  Diagnosis Date  . Anxiety   . Cellulitis 2016  . CHF (congestive heart failure) ([place])   . Chronic kidney disease   . Dementia ([place])   . Dysphagia   . Fracture of radial neck, right, closed 08/06/2019  . Gait abnormality   . GERD (gastroesophageal reflux disease)   . Glaucoma   . Gout, unspecified 01/12/2010   Qualifier: Diagnosis of  By: Moshe Cipro MD, Joycelyn Schmid    . Hyperlipidemia   . Hypertension   . Muscle weakness   . Myocardial infarction [facility]) [place]  . Osteoarthritis   . Syncope     Past Surgical History:  Procedure Laterality Date  . APPENDECTOMY    . CATARACT EXTRACTION W/PHACO  08/04/2011   Procedure: CATARACT EXTRACTION PHACO AND INTRAOCULAR LENS PLACEMENT (IOC);  Surgeon: Tonny Branch;  Location: [facility];  Service: Ophthalmology;  Laterality: Right;  CDE=18.53  . CATARACT  EXTRACTION W/PHACO  08/25/2011   Procedure: CATARACT EXTRACTION PHACO AND INTRAOCULAR LENS PLACEMENT (IOC);  Surgeon: Tonny Branch;  Location: [facility] OR[facility];  Service: Ophthalmology;  Laterality: Left;  CDE:14.76  . TONSILLECTOMY      Social History   Socioeconomic History  . Marital status: Divorced    Spouse name: Not on file  . Number of children: Not on file  . Years of education: Not on file  . Highest education level: Not on file  Occupational History  . Occupation: retired   Tobacco Use  . Smoking status: Never Smoker  . Smokeless tobacco: Never Used  Vaping Use  . Vaping Use: Never used  Substance and Sexual Activity  . Alcohol use: No  . Drug use: No  . Sexual activity: Not Currently  Other Topics Concern  . Not on file  Social History Narrative   Long term resident [facility]    Social Determinants of Health   Financial Resource Strain: Not on file  Food Insecurity: Not on file  Transportation Needs: Not on file  Physical Activity: Not on file  Stress: Not on file  Social Connections: Not on file  Intimate Partner Violence: Not on file   Family History  Problem Relation Age of Onset  . Diabetes Father   . Diabetes  Sister   . Diabetes Brother   . Stroke Sister   . Stroke Sister   . Anesthesia problems Neg Hx   . Hypotension Neg Hx   . Malignant hyperthermia Neg Hx   . Pseudochol deficiency Neg Hx       VITAL SIGNS BP (!) 155/58   Pulse 70   Temp 98.1 F (36.7 C)   Resp 18   Ht 4\' 11"  (1.499 m)   Wt 134 lb 6.4 oz (61 kg)   BMI 27.15 kg/m   Outpatient Encounter Medications as of 12/15/2020  Medication Sig  . acetaminophen (TYLENOL) 500 MG tablet Take 1,000 mg by mouth every 8 (eight) hours as needed. For leg pain and arthritis. May have additional 1000 mg for break through pain once a day prn  . alum & mag hydroxide-simeth (MAALOX PLUS) 400-400-40 MG/5ML suspension Amt: 4 teaspoons; oral Special Instructions: for indigestion/heartburn Twice A Day - PRN PRN  1, PRN 2  . Alum & Mag Hydroxide-Simeth (MYLANTA PO) Take by mouth. amt: 4 tsp.; oral Special Instructions: after breakfast Once A Day 09:00 AM  . Balsam [place]-Castor Oil (VENELEX) OINT Apply topically to sacrum and bilateral buttocks every shift and as needed  . Brinzolamide-Brimonidine 1-0.2 % SUSP One  drop into both eyes three times a day  . Calcium Carbonate-Vitamin D (CALCIUM-VITAMIN D) 500-200 MG-UNIT tablet Take 1 tablet by mouth 2 (two) times daily.  . citalopram (CELEXA) 10 MG tablet Take 30 mg by mouth daily.   . cycloSPORINE (RESTASIS) 0.05 % ophthalmic emulsion Place 1 drop into both eyes 2 (two) times daily.   Marland Kitchen Dextromethorphan-guaiFENesin (ROBITUSSIN DM PO) 15 ml by mouth every 6 hours prn for cough/congestion. May give up to 48 hrs. Inform physician immediately if cough or congestion is associated with fever or SOB Every 6 Hours - PRN  . docusate sodium (COLACE) 100 MG capsule Take 100 mg by mouth daily.   . furosemide (LASIX) 40 MG tablet Take 40 mg by mouth daily.   . hydrALAZINE (APRESOLINE) 50 MG tablet Take 50 mg by mouth 2 (two) times a day.   . ipratropium-albuterol (DUONEB) 0.5-2.5 (3) MG/3ML SOLN Take 3 mLs by nebulization every 6 (six) hours as needed.  . latanoprost (XALATAN) 0.005 % ophthalmic solution Place 1 drop into both eyes at bedtime.  Marland Kitchen loratadine (CLARITIN) 10 MG tablet Take 10 mg by mouth daily.  Marland Kitchen LORazepam (ATIVAN) 0.5 MG tablet Take 0.5 tablets (0.25 mg total) by mouth 2 (two) times daily.  . nitroGLYCERIN (NITROSTAT) 0.4 MG SL tablet Place 0.4 mg under the tongue every 5 (five) minutes as needed for chest pain.  . NON FORMULARY Diet Type:  Regular  . OXYGEN Inhale 2 L/min into the lungs continuous. Wean to keep sats above 90%  . pantoprazole (PROTONIX) 40 MG tablet Take 40 mg by mouth daily. Do not crush.  . potassium chloride SA (KLOR-CON) 20 MEQ tablet Take 20 mEq by mouth daily. DX hypokalemia  . QUEtiapine (SEROQUEL) 25 MG tablet Take 12.5 mg by  mouth 2 (two) times daily. Special Instructions: delusions; hallucinations   No facility-administered encounter medications on file as of 12/15/2020.     SIGNIFICANT DIAGNOSTIC EXAMS   PREVIOUS ;   03-21-20: chest x-ray: Mild congestive heart failure   NO NEW EXAMS.   LABS REVIEWED PREVIOUS:    02-24-20: wbc 4.9; hgb 9.5; hct 30.0 mcv 82.2 plt 160; glucose 82; bun 49; creat 1.54; k+ 3.4; na++ 140; ca 8.7 liver  normal albumin 3.4 02-27-20: glucose 84; bun 41; creat 1.29; k+ 3.9; na++ 142; ca 9.0  03-12-20: glucose 87; bun 45; creat 1.26; k+ 3.7; na++ 141; ca 9.0 03-18-20: tsh 1.548 vit B 12: 529 03-30-20: glucose 88; bun 26; creat 1.33; k+ 2.9; na++ 140 ca 9.0  03-31-20: k+ 3.6 07-08-20: wbc 4.3; hgb 10.9; hct 34.6; mcv 90.3 plt 190; glucose 89; bun 36; creat 1.42; k+ 3.8; na++ 142; ca 9.3 liver normal albumin 3.4 folate 14.7; vit B 12: 541 tsh 1.548 08-27-20: glucose 94; bun 30; creat 1.33; k+ 3.7; na++ 142; ca 9.1 GFR 37 08-31-20: glucose 127; bun 40; creat 1.33; k+ 4.1; na++ 138; ca 9.0; GFR 37   NO NEW LABS.   Review of Systems  Constitutional: Negative for malaise/fatigue.  Respiratory: Negative for cough and shortness of breath.   Cardiovascular: Negative for chest pain, palpitations and leg swelling.  Gastrointestinal: Negative for abdominal pain, constipation and heartburn.  Musculoskeletal: Negative for back pain, joint pain and myalgias.  Skin: Negative.   Neurological: Negative for dizziness.  Psychiatric/Behavioral: The patient is not nervous/anxious.      Physical Exam Constitutional:      General: She is not in acute distress.    Appearance: She is well-developed. She is not diaphoretic.  Neck:     Thyroid: No thyromegaly.  Cardiovascular:     Rate and Rhythm: Regular rhythm. Bradycardia present.     Pulses: Normal pulses.     Heart sounds: Normal heart sounds.  Pulmonary:     Effort: Pulmonary effort is normal. No respiratory distress.     Breath sounds: Normal  breath sounds.  Abdominal:     General: Bowel sounds are normal. There is no distension.     Palpations: Abdomen is soft.     Tenderness: There is no abdominal tenderness.  Musculoskeletal:        General: Normal range of motion.     Cervical back: Neck supple.     Right lower leg: No edema.     Left lower leg: No edema.  Lymphadenopathy:     Cervical: No cervical adenopathy.  Skin:    General: Skin is warm and dry.     Comments: Bilateral lower extremities discolored   Neurological:     Mental Status: She is alert. Mental status is at baseline.     Comments:  04-17-20: BCAT 14/45     Psychiatric:        Mood and Affect: Mood normal.     ASSESSMENT/ PLAN:  TODAY:   1. Primary osteoarthritis multiple joints: is stable will continue tylenol 1 gm twice daily   2. Late onset alzheimer's disease with behavioral disturbance: is stable is off aricept will monitor weight is 134 pounds.  3. Major depression with psychotic features is stable will continue celexa 30 mg daily (failed one wean); ativan 0.25 mg twice daily (has failed 2 weans) will continue seroquel 12.5 mg nightly  4. CKD (chronic kidney disease) stage 3 GFR 30-59; is stable bun 36; creat 1.42 GFR 37  PREVIOUS   5. Chronic constipation: is stable will continue colace twice daily  6. Chronic diastolic CHF (congestive heart failure) EF 65-70% (03-04-18) is stable will continue apresoline 50 mg twice daily lasix 40 mg daily has prn ntg  7. Deep vein thrombosis (DVT) of left lower extremity unspecified chronicity unspecified vein: is stable will continue to monitor her status.   8 Chronic non-seasonal allergic rhinitis: is stable will continue claritin 10  mg daily   9. Hypertensive heart and kidney disease with chronic diastolic congestive heart failure and stage 3 chronic kidney disease is stable b/p 155/58 will continue apresoline 50 mg twice daily and lasix 40 mg daily   10. GERD without esophagitis: is stable will  continue protonix 40 mg daily   11. Chronic respiratory failure with hypoxia on home 02 therapy: is stable will continue 02; claritin 10 mg daily   12. Increased intraocular pressure bilateral is stable will continue simbrinza three times daily travatan night both to both eyes  13. Hypokalemia: is stable k+ 4.1 will continue k+ 20 meq daily    Will check cbc; cmp vit b [place] NP [facility]  Contact [phone] Monday through Friday 8am- 5pm  After hours call [phone]

## 2020-12-17 ENCOUNTER — Other Ambulatory Visit (HOSPITAL_COMMUNITY)
Admission: RE | Admit: 2020-12-17 | Discharge: 2020-12-17 | Disposition: A | Discharge status: Home / Self Care | Payer: Medicare Other | Source: Skilled Nursing Facility | Attending: Adult Health | Admitting: Adult Health

## 2020-12-17 DIAGNOSIS — I130 Hypertensive heart and chronic kidney disease with heart failure and stage 1 through stage 4 chronic kidney disease, or unspecified chronic kidney disease: Secondary | ICD-10-CM | POA: Diagnosis present

## 2020-12-17 LAB — COMPREHENSIVE METABOLIC PANEL
ALT: 12 U/L (ref 0–44)
AST: 20 U/L (ref 15–41)
Albumin: 3 g/dL — ABNORMAL LOW (ref 3.5–5.0)
Alkaline Phosphatase: 61 U/L (ref 38–126)
Anion gap: 8 (ref 5–15)
BUN: 51 mg/dL — ABNORMAL HIGH (ref 8–23)
CO2: 34 mmol/L — ABNORMAL HIGH (ref 22–32)
Calcium: 9.3 mg/dL (ref 8.9–10.3)
Chloride: 98 mmol/L (ref 98–111)
Creatinine, Ser: 1.07 mg/dL — ABNORMAL HIGH (ref 0.44–1.00)
GFR, Estimated: 48 mL/min — ABNORMAL LOW (ref >60)
Glucose, Bld: 92 mg/dL (ref 70–99)
Potassium: 3.9 mmol/L (ref 3.5–5.1)
Sodium: 140 mmol/L (ref 135–145)
Total Bilirubin: 0.4 mg/dL (ref 0.3–1.2)
Total Protein: 6.4 g/dL — ABNORMAL LOW (ref 6.5–8.1)

## 2020-12-17 LAB — CBC
HCT: 34.1 % — ABNORMAL LOW (ref 36.0–46.0)
Hemoglobin: 11 g/dL — ABNORMAL LOW (ref 12.0–15.0)
MCH: 28.8 pg (ref 26.0–34.0)
MCHC: 32.3 g/dL (ref 30.0–36.0)
MCV: 89.3 fL (ref 80.0–100.0)
Platelets: 205 10*3/uL (ref 150–400)
RBC: 3.82 MIL/uL — ABNORMAL LOW (ref 3.87–5.11)
RDW: 15.6 % — ABNORMAL HIGH (ref 11.5–15.5)
WBC: 5.5 10*3/uL (ref 4.0–10.5)
nRBC: 0 % (ref 0.0–0.2)

## 2020-12-17 LAB — VITAMIN B12: Vitamin B-12: 415 pg/mL (ref 180–914)

## 2020-12-21 ENCOUNTER — Encounter: Payer: Self-pay | Admitting: Adult Health

## 2020-12-21 ENCOUNTER — Non-Acute Institutional Stay (SKILLED_NURSING_FACILITY): Payer: Medicare Other | Admitting: Adult Health

## 2020-12-21 DIAGNOSIS — Z0000 Encounter for general adult medical examination without abnormal findings: Secondary | ICD-10-CM

## 2020-12-21 NOTE — Progress Notes (Signed)
Subjective:   [name] is a 87 y.o. female who presents for Medicare Annual (Subsequent) preventive examination.  Review of Systems    Review of Systems  Constitutional: Negative for malaise/fatigue.  Respiratory: Negative for cough and shortness of breath.   Cardiovascular: Negative for chest pain, palpitations and leg swelling.  Gastrointestinal: Negative for abdominal pain, constipation and heartburn.  Musculoskeletal: Negative for back pain, joint pain and myalgias.  Skin: Negative.   Neurological: Negative for dizziness.  Psychiatric/Behavioral: The patient is not nervous/anxious.     Cardiac Risk Factors include: advanced age (>61men, >26 women);hypertension;sedentary lifestyle     Objective:    Today's Vitals   12/21/20 1320 12/21/20 1322  BP: (!) 156/74   Pulse: 71   Resp: 16   Temp: 98.3 F (36.8 C)   SpO2: 93%   Weight: 134 lb 6.4 oz (61 kg)   Height: 4\' 11"  (1.499 m)   PainSc:  0-No pain   Body mass index is 27.15 kg/m.  Advanced Directives 11/09/2020 10/29/2020 10/14/2020 09/09/2020 08/13/2020 08/11/2020 08/05/2020  Does Patient Have a Medical Advance Directive? Yes Yes Yes Yes Yes Yes Yes  Type of Advance Directive Out of facility DNR (pink MOST or yellow form) Out of facility DNR (pink MOST or yellow form) Out of facility DNR (pink MOST or yellow form) Out of facility DNR (pink MOST or yellow form) Out of facility DNR (pink MOST or yellow form) Out of facility DNR (pink MOST or yellow form) Out of facility DNR (pink MOST or yellow form)  Does patient want to make changes to medical advance directive? No - Patient declined No - Patient declined No - Patient declined No - Patient declined No - Patient declined No - Patient declined No - Patient declined  Copy of [place] in [place]  Would patient like information on creating a medical advance directive? - - - - - - -  Pre-existing out of facility DNR order (yellow form or  pink MOST form) Yellow form placed in chart (order not valid for inpatient use) Yellow form placed in chart (order not valid for inpatient use) Yellow form placed in chart (order not valid for inpatient use) Yellow form placed in chart (order not valid for inpatient use) Yellow form placed in chart (order not valid for inpatient use) Yellow form placed in chart (order not valid for inpatient use) Yellow form placed in chart (order not valid for inpatient use)    Current Medications (verified) Outpatient Encounter Medications as of 12/21/2020  Medication Sig  . acetaminophen (TYLENOL) 500 MG tablet Take 1,000 mg by mouth every 8 (eight) hours as needed. For leg pain and arthritis. May have additional 1000 mg for break through pain once a day prn  . alum & mag hydroxide-simeth (MAALOX PLUS) 400-400-40 MG/5ML suspension Amt: 4 teaspoons; oral Special Instructions: for indigestion/heartburn Twice A Day - PRN PRN 1, PRN 2  . Alum & Mag Hydroxide-Simeth (MYLANTA PO) Take by mouth. amt: 4 tsp.; oral Special Instructions: after breakfast Once A Day 09:00 AM  . Balsam [place]-Castor Oil (VENELEX) OINT Apply topically to sacrum and bilateral buttocks every shift and as needed  . Brinzolamide-Brimonidine 1-0.2 % SUSP One  drop into both eyes three times a day  . Calcium Carbonate-Vitamin D (CALCIUM-VITAMIN D) 500-200 MG-UNIT tablet Take 1 tablet by mouth 2 (two) times daily.  . citalopram (CELEXA) 10 MG tablet Take 30 mg by mouth daily.   Marland Kitchen  cycloSPORINE (RESTASIS) 0.05 % ophthalmic emulsion Place 1 drop into both eyes 2 (two) times daily.   Marland Kitchen Dextromethorphan-guaiFENesin (ROBITUSSIN DM PO) 15 ml by mouth every 6 hours prn for cough/congestion. May give up to 48 hrs. Inform physician immediately if cough or congestion is associated with fever or SOB Every 6 Hours - PRN  . docusate sodium (COLACE) 100 MG capsule Take 100 mg by mouth daily.   . furosemide (LASIX) 40 MG tablet Take 40 mg by mouth daily.   .  hydrALAZINE (APRESOLINE) 50 MG tablet Take 50 mg by mouth 2 (two) times a day.   . ipratropium-albuterol (DUONEB) 0.5-2.5 (3) MG/3ML SOLN Take 3 mLs by nebulization every 6 (six) hours as needed.  . latanoprost (XALATAN) 0.005 % ophthalmic solution Place 1 drop into both eyes at bedtime.  Marland Kitchen loratadine (CLARITIN) 10 MG tablet Take 10 mg by mouth daily.  Marland Kitchen LORazepam (ATIVAN) 0.5 MG tablet Take 0.5 tablets (0.25 mg total) by mouth 2 (two) times daily.  . nitroGLYCERIN (NITROSTAT) 0.4 MG SL tablet Place 0.4 mg under the tongue every 5 (five) minutes as needed for chest pain.  . NON FORMULARY Diet Type:  Regular  . OXYGEN Inhale 2 L/min into the lungs continuous. Wean to keep sats above 90%  . pantoprazole (PROTONIX) 40 MG tablet Take 40 mg by mouth daily. Do not crush.  . potassium chloride SA (KLOR-CON) 20 MEQ tablet Take 20 mEq by mouth daily. DX hypokalemia  . QUEtiapine (SEROQUEL) 25 MG tablet Take 12.5 mg by mouth 2 (two) times daily. Special Instructions: delusions; hallucinations   No facility-administered encounter medications on file as of 12/21/2020.    Allergies (verified) Bee venom, Ace inhibitors, Angiotensin receptor blockers, Aspirin, Cabbage, and Latex   History: Past Medical History:  Diagnosis Date  . Anxiety   . Cellulitis 2016  . CHF (congestive heart failure) ([employer])   . Chronic kidney disease   . Dementia ([place])   . Dysphagia   . Fracture of radial neck, right, closed 08/06/2019  . Gait abnormality   . GERD (gastroesophageal reflux disease)   . Glaucoma   . Gout, unspecified 01/12/2010   Qualifier: Diagnosis of  By: Moshe Cipro MD, Joycelyn Schmid    . Hyperlipidemia   . Hypertension   . Muscle weakness   . Myocardial infarction [facility]) [place]  . Osteoarthritis   . Syncope    Past Surgical History:  Procedure Laterality Date  . APPENDECTOMY    . CATARACT EXTRACTION W/PHACO  08/04/2011   Procedure: CATARACT EXTRACTION PHACO AND INTRAOCULAR LENS PLACEMENT (IOC);   Surgeon: Tonny Branch;  Location: [facility];  Service: Ophthalmology;  Laterality: Right;  CDE=18.53  . CATARACT EXTRACTION W/PHACO  08/25/2011   Procedure: CATARACT EXTRACTION PHACO AND INTRAOCULAR LENS PLACEMENT (IOC);  Surgeon: Tonny Branch;  Location: [facility];  Service: Ophthalmology;  Laterality: Left;  CDE:14.76  . TONSILLECTOMY     Family History  Problem Relation Age of Onset  . Diabetes Father   . Diabetes Sister   . Diabetes Brother   . Stroke Sister   . Stroke Sister   . Anesthesia problems Neg Hx   . Hypotension Neg Hx   . Malignant hyperthermia Neg Hx   . Pseudochol deficiency Neg Hx    Social History   Socioeconomic History  . Marital status: Divorced    Spouse name: Not on file  . Number of children: Not on file  . Years of education: Not on file  .  Highest education level: Not on file  Occupational History  . Occupation: retired   Tobacco Use  . Smoking status: Never Smoker  . Smokeless tobacco: Never Used  Vaping Use  . Vaping Use: Never used  Substance and Sexual Activity  . Alcohol use: No  . Drug use: No  . Sexual activity: Not Currently  Other Topics Concern  . Not on file  Social History Narrative   Long term resident [facility]    Social Determinants of Health   Financial Resource Strain: Not on file  Food Insecurity: Not on file  Transportation Needs: Not on file  Physical Activity: Not on file  Stress: Not on file  Social Connections: Not on file    Tobacco Counseling Counseling given: Not Answered   Clinical Intake:  Pre-visit preparation completed: Yes  Pain : No/denies pain Pain Score: 0-No pain     BMI - recorded: 27.15 Nutritional Status: BMI 25 -29 Overweight Nutritional Risks: Unintentional weight gain Diabetes: No  How often do you need to have someone help you when you read instructions, pamphlets, or other written materials from your doctor or pharmacy?: 5 - [place] Needed?: No      Activities  of Daily Living In your present state of health, do you have any difficulty performing the following activities: 12/21/2020  Hearing? Y  Vision? Y  Difficulty concentrating or making decisions? Y  Walking or climbing stairs? Y  Dressing or bathing? Y  Doing errands, shopping? Y  Preparing Food and eating ? Y  Using the Toilet? Y  In the past six months, have you accidently leaked urine? Y  Do you have problems with loss of bowel control? Y  Managing your Medications? Y  Housekeeping or managing your Housekeeping? Y  Some recent data might be hidden    Patient Care Team: Gerlene Fee, NP as PCP - General ([place], [place] ([place])  Indicate any recent [place] you may have received from other than [facility] providers in the past year (date may be approximate).     Assessment:   This is a routine wellness examination for Jontavia.  Hearing/Vision screen No exam data present  Dietary issues and exercise activities discussed: Current Exercise Habits: The patient does not participate in regular exercise at present, Exercise limited by: cardiac condition(s);orthopedic condition(s)  Goals    . DIET - INCREASE WATER INTAKE    . Follow up with Primary Care Provider    . General - Client will not be readmitted within 30 days (C-SNP)      Depression Screen PHQ 2/9 Scores 12/21/2020 12/20/2019 07/24/2019 06/09/2019 05/31/2018 05/22/2017  PHQ - 2 Score 0 0 0 0 0 0    Fall Risk Fall Risk  12/21/2020 12/20/2019 07/24/2019 06/09/2019 05/31/2018  Falls in the past year? 1 1 1  0 No  Number falls in past yr: 0 0 1 - -  Injury with Fall? 0 1 1 - -  Risk for fall due to : History of fall(s);Impaired balance/gait;Impaired mobility History of fall(s);Impaired balance/gait;Impaired mobility History of fall(s);Impaired balance/gait;Impaired mobility - -  Follow up Falls evaluation completed Falls evaluation completed Falls evaluation completed - -     FALL RISK PREVENTION PERTAINING TO THE HOME:  Any stairs in or around the home? no If so, are there any without handrails? n/a Home free of loose throw rugs in walkways, pet beds, electrical cords, etc? yes  Adequate lighting in your home to  reduce risk of falls? Yes   ASSISTIVE DEVICES UTILIZED TO PREVENT FALLS:  Life alert? n/a Use of a cane, walker or w/c? Wheelchair  Grab bars in the bathroom? Yes  Shower chair or bench in shower? Yes  Elevated toilet seat or a handicapped toilet? Yes   TIMED UP AND GO:  Was the test performed? no; wheelchair bound   Cognitive Function: MMSE - Mini Mental State Exam 12/21/2020  Not completed: Unable to complete     6CIT Screen 12/20/2019 05/31/2018 05/22/2017  What Year? 0 points 4 points 0 points  What month? 0 points 0 points 0 points  What time? 0 points 0 points 0 points  Count back from 20 2 points 0 points 0 points  Months in reverse 2 points 4 points 0 points  Repeat phrase 2 points 4 points 6 points  Total Score 6 12 6     Immunizations Immunization History  Administered Date(s) Administered  . Influenza Whole 06/11/2007, 10/16/2008, 05/21/2009, 05/05/2010  . Influenza-Unspecified 06/05/2014, 06/02/2016, 06/02/2017, 05/31/2018, 06/03/2019, 06/05/2020  . Moderna Sars-Covid-2 Vaccination 07/29/2020, 08/27/2020  . Pneumococcal Conjugate-13 06/06/2016  . Pneumococcal Polysaccharide-23 06/11/2007, 06/01/2016, 03/04/2018  . Td 06/11/2007  . Tdap 06/24/2017  . Zoster 05/21/2007   Vaccine per facility   Screening Tests Health Maintenance  Topic Date Due  . COVID-19 Vaccine (3 - Booster for Moderna series) 02/25/2021  . INFLUENZA VACCINE  03/29/2021  . TETANUS/TDAP  06/25/2027  . DEXA SCAN  Completed  . PNA vac Low Risk Adult  Completed  . HPV VACCINES  Aged Out    Health Maintenance  There are no preventive care reminders to display for this patient.    Lung Cancer Screening: (Low Dose CT Chest recommended if Age  59-80 years, 30 pack-year currently smoking OR have quit w/in 15years.) does not qualify.   Lung Cancer Screening Referral: n/a  Additional Screening:    Vision Screening: Recommended annual ophthalmology exams for early detection of glaucoma and other disorders of the eye. Is the patient up to date with their annual eye exam? yes  Who is the provider or what is the name of the office in which the patient attends annual eye exams? Per facility  If pt is not established with a provider, would they like to be referred to a provider to establish care?   Dental Screening: Recommended annual dental exams for proper oral hygiene  Community Resource Referral / Chronic Care Management: CRR required this visit?  no   CCM required this visit?  no     Plan:     I have personally reviewed and noted the following in the patient's chart:   . Medical and social history . Use of alcohol, tobacco or illicit drugs  . Current medications and supplements . Functional ability and status . Nutritional status . Physical activity . Advanced directives . List of other physicians . Hospitalizations, surgeries, and ER visits in previous 12 months . Vitals . Screenings to include cognitive, depression, and falls . Referrals and appointments  In addition, I have reviewed and discussed with patient certain preventive protocols, quality metrics, and best practice recommendations. A written personalized care plan for preventive services as well as general preventive health recommendations were provided to patient.     Gerlene Fee, NP   12/21/2020

## 2020-12-21 NOTE — Patient Instructions (Signed)
  [name] , Thank you for taking time to come for your Medicare Wellness Visit. I appreciate your ongoing commitment to your health goals. Please review the following plan we discussed and let me know if I can assist you in the future.   These are the goals we discussed: Goals    . DIET - INCREASE WATER INTAKE    . Follow up with Primary Care Provider    . General - Client will not be readmitted within 30 days (C-SNP)       This is a list of the screening recommended for you and due dates:  Health Maintenance  Topic Date Due  . COVID-19 Vaccine (3 - Booster for Moderna series) 02/25/2021  . Flu Shot  03/29/2021  . Tetanus Vaccine  06/25/2027  . DEXA scan (bone density measurement)  Completed  . Pneumonia vaccines  Completed  . HPV Vaccine  Aged Out

## 2020-12-25 ENCOUNTER — Other Ambulatory Visit: Payer: Self-pay | Admitting: Adult Health

## 2020-12-25 MED ORDER — LORAZEPAM 0.5 MG PO TABS: 0.2500 mg | ORAL_TABLET | Freq: Two times a day (BID) | ORAL | 0 refills | Status: DC

## 2021-01-18 ENCOUNTER — Other Ambulatory Visit: Payer: Self-pay | Admitting: Adult Health

## 2021-01-18 MED ORDER — LORAZEPAM 0.5 MG PO TABS: 0.2500 mg | ORAL_TABLET | Freq: Two times a day (BID) | ORAL | 0 refills | Status: DC

## 2021-01-22 ENCOUNTER — Non-Acute Institutional Stay (SKILLED_NURSING_FACILITY): Payer: Medicare Other | Admitting: Adult Health

## 2021-01-22 ENCOUNTER — Encounter: Payer: Self-pay | Admitting: Adult Health

## 2021-01-22 DIAGNOSIS — I82402 Acute embolism and thrombosis of unspecified deep veins of left lower extremity: Secondary | ICD-10-CM | POA: Diagnosis not present

## 2021-01-22 DIAGNOSIS — K5909 Other constipation: Secondary | ICD-10-CM

## 2021-01-22 DIAGNOSIS — J3089 Other allergic rhinitis: Secondary | ICD-10-CM

## 2021-01-22 DIAGNOSIS — I5032 Chronic diastolic (congestive) heart failure: Secondary | ICD-10-CM

## 2021-01-22 NOTE — Progress Notes (Signed)
Location:  [place] Room Number: 122-D Place of Service:  SNF (31)   CODE STATUS: DNR  Allergies  Allergen Reactions  . Bee Venom Anaphylaxis  . Ace Inhibitors   . Angiotensin Receptor Blockers     Tongue swelling.  . Aspirin     Directed by physician  . Cabbage Other (See Comments)    Certain foods due to gout flares  . Latex Rash    Chief Complaint  Patient presents with  . Medical Management of Chronic Issues           Chronic constipation:    Chronic diastolic CHF (congestive heart failure)    Deep vein thrombosis (DVT) of left lower extremity unspecified chronicity unspecified vein.    Chronic non-seasonal allergic rhinitis    HPI:  She is a 86 year old long term resident of this facility being seen for the management of her chronic illnesses  Chronic constipation:    Chronic diastolic CHF (congestive heart failure)    Deep vein thrombosis (DVT) of left lower extremity unspecified chronicity unspecified vein.    Chronic non-seasonal allergic rhinitis. There are no reports of sinus congestion; no reports of chest pain; no shortness of breath; no anxiety.   Past Medical History:  Diagnosis Date  . Anxiety   . Cellulitis 2016  . CHF (congestive heart failure) ([place])   . Chronic kidney disease   . Dementia ([place])   . Dysphagia   . Fracture of radial neck, right, closed 08/06/2019  . Gait abnormality   . GERD (gastroesophageal reflux disease)   . Glaucoma   . Gout, unspecified 01/12/2010   Qualifier: Diagnosis of  By: Moshe Cipro MD, Joycelyn Schmid    . Hyperlipidemia   . Hypertension   . Muscle weakness   . Myocardial infarction [facility]) [place]  . Osteoarthritis   . Syncope     Past Surgical History:  Procedure Laterality Date  . APPENDECTOMY    . CATARACT EXTRACTION W/PHACO  08/04/2011   Procedure: CATARACT EXTRACTION PHACO AND INTRAOCULAR LENS PLACEMENT (IOC);  Surgeon: Tonny Branch;  Location: [facility];  Service: Ophthalmology;  Laterality:  Right;  CDE=18.53  . CATARACT EXTRACTION W/PHACO  08/25/2011   Procedure: CATARACT EXTRACTION PHACO AND INTRAOCULAR LENS PLACEMENT (IOC);  Surgeon: Tonny Branch;  Location: [facility];  Service: Ophthalmology;  Laterality: Left;  CDE:14.76  . TONSILLECTOMY      Social History   Socioeconomic History  . Marital status: Divorced    Spouse name: Not on file  . Number of children: Not on file  . Years of education: Not on file  . Highest education level: Not on file  Occupational History  . Occupation: retired   Tobacco Use  . Smoking status: Never Smoker  . Smokeless tobacco: Never Used  Vaping Use  . Vaping Use: Never used  Substance and Sexual Activity  . Alcohol use: No  . Drug use: No  . Sexual activity: Not Currently  Other Topics Concern  . Not on file  Social History Narrative   Long term resident [facility]    Social Determinants of Health   Financial Resource Strain: Not on file  Food Insecurity: Not on file  Transportation Needs: Not on file  Physical Activity: Not on file  Stress: Not on file  Social Connections: Not on file  Intimate Partner Violence: Not on file   Family History  Problem Relation Age of Onset  . Diabetes Father   .  Diabetes Sister   . Diabetes Brother   . Stroke Sister   . Stroke Sister   . Anesthesia problems Neg Hx   . Hypotension Neg Hx   . Malignant hyperthermia Neg Hx   . Pseudochol deficiency Neg Hx       VITAL SIGNS BP (!) 142/74   Pulse (!) 59   Temp (!) 97.2 F (36.2 C)   Resp 18   Ht 4\' 11"  (1.499 m)   Wt 133 lb 12.8 oz (60.7 kg)   SpO2 98%   BMI 27.02 kg/m   Outpatient Encounter Medications as of 01/22/2021  Medication Sig  . acetaminophen (TYLENOL) 500 MG tablet Take 1,000 mg by mouth every 8 (eight) hours as needed. For leg pain and arthritis. May have additional 1000 mg for break through pain once a day prn  . alum & mag hydroxide-simeth (MAALOX PLUS) 400-400-40 MG/5ML suspension Amt: 4 teaspoons; oral Special  Instructions: for indigestion/heartburn Twice A Day - PRN PRN 1, PRN 2  . Alum & Mag Hydroxide-Simeth (MYLANTA PO) Take by mouth. amt: 4 tsp.; oral Special Instructions: after breakfast Once A Day 09:00 AM  . Balsam [place]-Castor Oil (VENELEX) OINT Apply topically to sacrum and bilateral buttocks every shift and as needed  . Brinzolamide-Brimonidine 1-0.2 % SUSP One  drop into both eyes three times a day  . Calcium Carbonate-Vitamin D (CALCIUM-VITAMIN D) 500-200 MG-UNIT tablet Take 1 tablet by mouth 2 (two) times daily.  . citalopram (CELEXA) 10 MG tablet Take 30 mg by mouth daily.   . cycloSPORINE (RESTASIS) 0.05 % ophthalmic emulsion Place 1 drop into both eyes 2 (two) times daily.   Marland Kitchen Dextromethorphan-guaiFENesin (ROBITUSSIN DM PO) 15 ml by mouth every 6 hours prn for cough/congestion. May give up to 48 hrs. Inform physician immediately if cough or congestion is associated with fever or SOB Every 6 Hours - PRN  . docusate sodium (COLACE) 100 MG capsule Take 100 mg by mouth daily.   . furosemide (LASIX) 40 MG tablet Take 40 mg by mouth daily.   . hydrALAZINE (APRESOLINE) 50 MG tablet Take 50 mg by mouth 2 (two) times a day.   . latanoprost (XALATAN) 0.005 % ophthalmic solution Place 1 drop into both eyes at bedtime.  Marland Kitchen loratadine (CLARITIN) 10 MG tablet Take 10 mg by mouth daily.  Marland Kitchen LORazepam (ATIVAN) 0.5 MG tablet Take 0.5 tablets (0.25 mg total) by mouth 2 (two) times daily.  . nitroGLYCERIN (NITROSTAT) 0.4 MG SL tablet Place 0.4 mg under the tongue every 5 (five) minutes as needed for chest pain.  . NON FORMULARY Diet Type:  Regular  . OXYGEN Inhale 2 L/min into the lungs continuous. Wean to keep sats above 90%  . pantoprazole (PROTONIX) 40 MG tablet Take 40 mg by mouth daily. Do not crush.  . potassium chloride SA (KLOR-CON) 20 MEQ tablet Take 20 mEq by mouth daily. DX hypokalemia  . QUEtiapine (SEROQUEL) 25 MG tablet Take 12.5 mg by mouth 2 (two) times daily. Special Instructions:  delusions; hallucinations  . [DISCONTINUED] ipratropium-albuterol (DUONEB) 0.5-2.5 (3) MG/3ML SOLN Take 3 mLs by nebulization every 6 (six) hours as needed. (Patient not taking: Reported on 01/22/2021)   No facility-administered encounter medications on file as of 01/22/2021.     SIGNIFICANT DIAGNOSTIC EXAMS  PREVIOUS ;   03-21-20: chest x-ray: Mild congestive heart failure   NO NEW EXAMS.   LABS REVIEWED PREVIOUS:    02-24-20: wbc 4.9; hgb 9.5; hct 30.0 mcv 82.2 plt 160;  glucose 82; bun 49; creat 1.54; k+ 3.4; na++ 140; ca 8.7 liver normal albumin 3.4 02-27-20: glucose 84; bun 41; creat 1.29; k+ 3.9; na++ 142; ca 9.0  03-12-20: glucose 87; bun 45; creat 1.26; k+ 3.7; na++ 141; ca 9.0 03-18-20: tsh 1.548 vit B 12: 529 03-30-20: glucose 88; bun 26; creat 1.33; k+ 2.9; na++ 140 ca 9.0  03-31-20: k+ 3.6 07-08-20: wbc 4.3; hgb 10.9; hct 34.6; mcv 90.3 plt 190; glucose 89; bun 36; creat 1.42; k+ 3.8; na++ 142; ca 9.3 liver normal albumin 3.4 folate 14.7; vit B 12: 541 tsh 1.548 08-27-20: glucose 94; bun 30; creat 1.33; k+ 3.7; na++ 142; ca 9.1 GFR 37 08-31-20: glucose 127; bun 40; creat 1.33; k+ 4.1; na++ 138; ca 9.0; GFR 37   TODAY  12-17-20: wbc 5.5; hgb 11.0; hct 34.1; mcv 89.3 plt 205; glucose 92; bun 51; creat 1.07; k+ 3.9; na++ 140; ca 9.3 GFR 48; liver normal albumin 3.0 vit B 12: 415  Review of Systems  Constitutional: Negative for malaise/fatigue.  Respiratory: Negative for cough and shortness of breath.   Cardiovascular: Negative for chest pain, palpitations and leg swelling.  Gastrointestinal: Negative for abdominal pain, constipation and heartburn.  Musculoskeletal: Negative for back pain, joint pain and myalgias.  Skin: Negative.   Neurological: Negative for dizziness.  Psychiatric/Behavioral: The patient is not nervous/anxious.       Physical Exam Constitutional:      General: She is not in acute distress.    Appearance: She is well-developed. She is not diaphoretic.  Neck:      Thyroid: No thyromegaly.  Cardiovascular:     Rate and Rhythm: Regular rhythm. Bradycardia present.     Pulses: Normal pulses.     Heart sounds: Normal heart sounds.  Pulmonary:     Effort: Pulmonary effort is normal. No respiratory distress.     Breath sounds: Normal breath sounds.  Abdominal:     General: Bowel sounds are normal. There is no distension.     Palpations: Abdomen is soft.     Tenderness: There is no abdominal tenderness.  Musculoskeletal:        General: Normal range of motion.     Cervical back: Neck supple.     Right lower leg: No edema.     Left lower leg: No edema.  Lymphadenopathy:     Cervical: No cervical adenopathy.  Skin:    General: Skin is warm and dry.     Comments: Bilateral lower extremities discolored   Neurological:     Mental Status: She is alert. Mental status is at baseline.     Comments: 04-17-20: BCAT 14/45   Psychiatric:        Mood and Affect: Mood normal.      ASSESSMENT/ PLAN:  TODAY:   1.  Chronic constipation: is stable will continue colace twice daily   2. Chronic diastolic CHF (congestive heart failure) EF 65-70% (03-04-18) is stable will continue apresoline 50 mg twice daily lasix 40 mg daily has prn ntg  3. Deep vein thrombosis (DVT) of left lower extremity unspecified chronicity unspecified vein. Is stable will continue to monitor her status.   4. Chronic non-seasonal allergic rhinitis: is stable will continue claritin 10 mg daily    PREVIOUS   5. Hypertensive heart and kidney disease with chronic diastolic congestive heart failure and stage 3 chronic kidney disease is stable b/p 142/74 will continue apresoline 50 mg twice daily and lasix 40 mg daily   6.  GERD without esophagitis: is stable will continue protonix 40 mg daily   7. Chronic respiratory failure with hypoxia on home 02 therapy: is stable will continue 02; claritin 10 mg daily   8. Increased intraocular pressure bilateral is stable will continue simbrinza  three times daily travatan night both to both eyes  9. Hypokalemia: is stable k+ 3.9 will continue k+ 20 meq daily   10. Primary osteoarthritis multiple joints: is stable will continue tylenol 1 gm twice daily   11. Late onset alzheimer's disease with behavioral disturbance: is stable is off aricept will monitor weight is 133 pounds.  12. Major depression with psychotic features is stable will continue celexa 30 mg daily (failed one wean); ativan 0.25 mg twice daily (has failed 2 weans) will continue seroquel 12.5 mg nightly  13. CKD (chronic kidney disease) stage 3 GFR 30-59; is stable bun 51; creat 1.07 GFR [place] NP [facility]  Contact [phone] Monday through Friday 8am- 5pm  After hours call [phone]

## 2021-02-04 ENCOUNTER — Non-Acute Institutional Stay (SKILLED_NURSING_FACILITY): Payer: Medicare Other | Admitting: Adult Health

## 2021-02-04 ENCOUNTER — Encounter: Payer: Self-pay | Admitting: Adult Health

## 2021-02-04 DIAGNOSIS — I130 Hypertensive heart and chronic kidney disease with heart failure and stage 1 through stage 4 chronic kidney disease, or unspecified chronic kidney disease: Secondary | ICD-10-CM

## 2021-02-04 DIAGNOSIS — I5032 Chronic diastolic (congestive) heart failure: Secondary | ICD-10-CM | POA: Diagnosis not present

## 2021-02-04 DIAGNOSIS — F0281 Dementia in other diseases classified elsewhere with behavioral disturbance: Secondary | ICD-10-CM | POA: Diagnosis not present

## 2021-02-04 DIAGNOSIS — G301 Alzheimer's disease with late onset: Secondary | ICD-10-CM | POA: Diagnosis not present

## 2021-02-04 DIAGNOSIS — N1832 Chronic kidney disease, stage 3b: Secondary | ICD-10-CM

## 2021-02-04 NOTE — Progress Notes (Signed)
Location:  [place] Room Number: 122-D Place of Service:  SNF (31)   CODE STATUS: DNR  Allergies  Allergen Reactions   Bee Venom Anaphylaxis   Ace Inhibitors    Angiotensin Receptor Blockers     Tongue swelling.   Aspirin     Directed by physician   Cabbage Other (See Comments)    Certain foods due to gout flares   Latex Rash    Chief Complaint  Patient presents with   Acute Visit    Care plan meeting.    HPI:  We have come together for her care plan meeting. Family present  BIMS 15/15 mood 0/30. She requires extensive assist to dependent for her adl care. She feeds herself; is incontinent of bladder and bowel. She spends all of her time in bed per her choice. Therapy none at this time.   Activities: one on one time.   dietary: weight is 133.8 pounds is up 2 pounds usually 125-131 pounds. Appetite is poor is on a regular diet. She continues  to be followed for her chronic illnesses including: Hypertensive heart and kidney disease with chronic diastolic congestive heart failure and stage 3b chronic kidney disease Chronic diastolic CHF (congestive heart failure)  Late onset alzheimer's disease without behavioral disturbance:   Past Medical History:  Diagnosis Date   Anxiety    Cellulitis 2016   CHF (congestive heart failure) ([facility])    Chronic kidney disease    Dementia ([facility])    Dysphagia    Fracture of radial neck, right, closed 08/06/2019   Gait abnormality    GERD (gastroesophageal reflux disease)    Glaucoma    Gout, unspecified 01/12/2010   Qualifier: Diagnosis of  By: Moshe Cipro MD, Margaret     Hyperlipidemia    Hypertension    Muscle weakness    Myocardial infarction ([place]) 1990s   [place]   Osteoarthritis    Syncope     Past Surgical History:  Procedure Laterality Date   APPENDECTOMY     CATARACT EXTRACTION W/PHACO  08/04/2011   Procedure: CATARACT EXTRACTION PHACO AND INTRAOCULAR LENS PLACEMENT ([place]);  Surgeon: Tonny Branch;  Location:  [facility];  Service: Ophthalmology;  Laterality: Right;  CDE=18.53   CATARACT EXTRACTION W/PHACO  08/25/2011   Procedure: CATARACT EXTRACTION PHACO AND INTRAOCULAR LENS PLACEMENT (IOC);  Surgeon: Tonny Branch;  Location: [facility];  Service: Ophthalmology;  Laterality: Left;  CDE:14.76   TONSILLECTOMY      Social History   Socioeconomic History   Marital status: Divorced    Spouse name: Not on file   Number of children: Not on file   Years of education: Not on file   Highest education level: Not on file  Occupational History   Occupation: retired   Tobacco Use   Smoking status: Never   Smokeless tobacco: Never  Vaping Use   Vaping Use: Never used  Substance and Sexual Activity   Alcohol use: No   Drug use: No   Sexual activity: Not Currently  Other Topics Concern   Not on file  Social History Narrative   Long term resident [facility]    Social Determinants of Health   Financial Resource Strain: Not on file  Food Insecurity: Not on file  Transportation Needs: Not on file  Physical Activity: Not on file  Stress: Not on file  Social Connections: Not on file  Intimate Partner Violence: Not on file   Family History  Problem Relation Age of Onset  Diabetes Father    Diabetes Sister    Diabetes Brother    Stroke Sister    Stroke Sister    Anesthesia problems Neg Hx    Hypotension Neg Hx    Malignant hyperthermia Neg Hx    Pseudochol deficiency Neg Hx       VITAL SIGNS BP (!) 151/85   Pulse 75   Temp 98.2 F (36.8 C)   Resp 16   Ht 4\' 11"  (1.499 m)   Wt 133 lb (60.3 kg)   SpO2 93%   BMI 26.86 kg/m   Outpatient Encounter Medications as of 02/04/2021  Medication Sig   acetaminophen (TYLENOL) 500 MG tablet Take 1,000 mg by mouth every 8 (eight) hours as needed. For leg pain and arthritis. May have additional 1000 mg for break through pain once a day prn   alum & mag hydroxide-simeth (MAALOX PLUS) 400-400-40 MG/5ML suspension Amt: 4 teaspoons; oral Special Instructions: for  indigestion/heartburn Twice A Day - PRN PRN 1, PRN 2   Alum & Mag Hydroxide-Simeth (MYLANTA PO) Take by mouth. amt: 4 tsp.; oral Special Instructions: after breakfast Once A Day 09:00 AM   Balsam [place]-Castor Oil (VENELEX) OINT Apply topically to sacrum and bilateral buttocks every shift and as needed   Brinzolamide-Brimonidine 1-0.2 % SUSP One  drop into both eyes three times a day   Calcium Carbonate-Vitamin D (CALCIUM-VITAMIN D) 500-200 MG-UNIT tablet Take 1 tablet by mouth 2 (two) times daily.   citalopram (CELEXA) 10 MG tablet Take 30 mg by mouth daily.    cycloSPORINE (RESTASIS) 0.05 % ophthalmic emulsion Place 1 drop into both eyes 2 (two) times daily.    Dextromethorphan-guaiFENesin (ROBITUSSIN DM PO) 15 ml by mouth every 6 hours prn for cough/congestion. May give up to 48 hrs. Inform physician immediately if cough or congestion is associated with fever or SOB Every 6 Hours - PRN   docusate sodium (COLACE) 100 MG capsule Take 100 mg by mouth daily.    furosemide (LASIX) 40 MG tablet Take 40 mg by mouth daily.    hydrALAZINE (APRESOLINE) 50 MG tablet Take 50 mg by mouth 2 (two) times a day.    latanoprost (XALATAN) 0.005 % ophthalmic solution Place 1 drop into both eyes at bedtime.   loratadine (CLARITIN) 10 MG tablet Take 10 mg by mouth daily.   LORazepam (ATIVAN) 0.5 MG tablet Take 0.5 tablets (0.25 mg total) by mouth 2 (two) times daily.   nitroGLYCERIN (NITROSTAT) 0.4 MG SL tablet Place 0.4 mg under the tongue every 5 (five) minutes as needed for chest pain.   NON FORMULARY Diet Type:  Regular   OXYGEN Inhale 2 L/min into the lungs continuous. Wean to keep sats above 90%   pantoprazole (PROTONIX) 40 MG tablet Take 40 mg by mouth daily. Do not crush.   potassium chloride SA (KLOR-CON) 20 MEQ tablet Take 20 mEq by mouth daily. DX hypokalemia   QUEtiapine (SEROQUEL) 25 MG tablet Take 12.5 mg by mouth 2 (two) times daily. Special Instructions: delusions; hallucinations   No  facility-administered encounter medications on file as of 02/04/2021.     SIGNIFICANT DIAGNOSTIC EXAMS  PREVIOUS ;   03-21-20: chest x-ray: Mild congestive heart failure   NO NEW EXAMS.   LABS REVIEWED PREVIOUS:    02-24-20: wbc 4.9; hgb 9.5; hct 30.0 mcv 82.2 plt 160; glucose 82; bun 49; creat 1.54; k+ 3.4; na++ 140; ca 8.7 liver normal albumin 3.4 02-27-20: glucose 84; bun 41; creat 1.29; k+ 3.9;  na++ 142; ca 9.0  03-12-20: glucose 87; bun 45; creat 1.26; k+ 3.7; na++ 141; ca 9.0 03-18-20: tsh 1.548 vit B 12: 529 03-30-20: glucose 88; bun 26; creat 1.33; k+ 2.9; na++ 140 ca 9.0  03-31-20: k+ 3.6 07-08-20: wbc 4.3; hgb 10.9; hct 34.6; mcv 90.3 plt 190; glucose 89; bun 36; creat 1.42; k+ 3.8; na++ 142; ca 9.3 liver normal albumin 3.4 folate 14.7; vit B 12: 541 tsh 1.548 08-27-20: glucose 94; bun 30; creat 1.33; k+ 3.7; na++ 142; ca 9.1 GFR 37 08-31-20: glucose 127; bun 40; creat 1.33; k+ 4.1; na++ 138; ca 9.0; GFR 37  12-17-20: wbc 5.5; hgb 11.0; hct 34.1; mcv 89.3 plt 205; glucose 92; bun 51; creat 1.07; k+ 3.9; na++ 140; ca 9.3 GFR 48; liver normal albumin 3.0 vit B 12: 415  NO NEW LABS.   Review of Systems  Constitutional:  Negative for malaise/fatigue.  Respiratory:  Negative for cough and shortness of breath.   Cardiovascular:  Negative for chest pain, palpitations and leg swelling.  Gastrointestinal:  Negative for abdominal pain, constipation and heartburn.  Musculoskeletal:  Negative for back pain, joint pain and myalgias.  Skin: Negative.   Neurological:  Negative for dizziness.  Psychiatric/Behavioral:  The patient is not nervous/anxious.    Physical Exam Constitutional:      General: She is not in acute distress.    Appearance: She is well-developed. She is not diaphoretic.  Neck:     Thyroid: No thyromegaly.  Cardiovascular:     Rate and Rhythm: Regular rhythm. Bradycardia present.     Pulses: Normal pulses.     Heart sounds: Normal heart sounds.  Pulmonary:     Effort:  Pulmonary effort is normal. No respiratory distress.     Breath sounds: Normal breath sounds.  Abdominal:     General: Bowel sounds are normal. There is no distension.     Palpations: Abdomen is soft.     Tenderness: There is no abdominal tenderness.  Musculoskeletal:        General: Normal range of motion.     Cervical back: Neck supple.     Right lower leg: No edema.     Left lower leg: No edema.  Lymphadenopathy:     Cervical: No cervical adenopathy.  Skin:    General: Skin is warm and dry.     Comments:  Bilateral lower extremities discolored    Neurological:     Mental Status: She is alert. Mental status is at baseline.     Comments: 04-17-20: BCAT 14/45    Psychiatric:        Mood and Affect: Mood normal.     ASSESSMENT/ PLAN:  TODAY  Hypertensive heart and kidney disease with chronic diastolic congestive heart failure and stage 3b chronic kidney disease Chronic diastolic CHF (congestive heart failure) Late onset alzheimer's disease without behavioral disturbance:   Will continue current medications Will refer to therapy for transfers  Will continue current plan of care Will continue to monitor her status.   Time spent patient: 40 minutes: overall health status; dietary issues; medications.    Ok Edwards NP [facility] Adult Medicine  Contact [phone] Monday through Friday 8am- 5pm  After hours call [phone]

## 2021-02-15 ENCOUNTER — Other Ambulatory Visit: Payer: Self-pay | Admitting: Adult Health

## 2021-02-15 MED ORDER — LORAZEPAM 0.5 MG PO TABS: 0.2500 mg | ORAL_TABLET | Freq: Two times a day (BID) | ORAL | 0 refills | Status: DC

## 2021-02-24 ENCOUNTER — Encounter: Payer: Self-pay | Admitting: Adult Health

## 2021-02-24 ENCOUNTER — Non-Acute Institutional Stay (SKILLED_NURSING_FACILITY): Payer: Medicare Other | Admitting: Adult Health

## 2021-02-24 DIAGNOSIS — H40053 Ocular hypertension, bilateral: Secondary | ICD-10-CM

## 2021-02-24 DIAGNOSIS — J9611 Chronic respiratory failure with hypoxia: Secondary | ICD-10-CM | POA: Diagnosis not present

## 2021-02-24 DIAGNOSIS — I130 Hypertensive heart and chronic kidney disease with heart failure and stage 1 through stage 4 chronic kidney disease, or unspecified chronic kidney disease: Secondary | ICD-10-CM

## 2021-02-24 DIAGNOSIS — K219 Gastro-esophageal reflux disease without esophagitis: Secondary | ICD-10-CM

## 2021-02-24 DIAGNOSIS — N1832 Chronic kidney disease, stage 3b: Secondary | ICD-10-CM

## 2021-02-24 DIAGNOSIS — Z9981 Dependence on supplemental oxygen: Secondary | ICD-10-CM

## 2021-02-24 DIAGNOSIS — I5032 Chronic diastolic (congestive) heart failure: Secondary | ICD-10-CM

## 2021-02-24 NOTE — Progress Notes (Signed)
Location:  [facility] Room Number: 22 Place of Service:  SNF (31)   CODE STATUS: dnr  Allergies  Allergen Reactions  . Bee Venom Anaphylaxis  . Ace Inhibitors   . Angiotensin Receptor Blockers     Tongue swelling.  . Aspirin     Directed by physician  . Cabbage Other (See Comments)    Certain foods due to gout flares  . Latex Rash    Chief Complaint  Patient presents with  . Medical Management of Chronic Issues        Hypertensive heart and kidney disease with chronic diastolic congestive heart failure and stage 3 chronic kidney disease:   GERD without esophagitis:  Chronic respiratory failure with hypoxia on home 02 therapy: Increased intraocular pressure bilateral    HPI:  [name] is a 87 year old long term resident of this facility being seen for the management of her chronic illnesses; Hypertensive heart and kidney disease with chronic diastolic congestive heart failure and stage 3 chronic kidney disease:   GERD without esophagitis:  Chronic respiratory failure with hypoxia on home 02 therapy: Increased intraocular pressure bilateral. There are no reports of uncontrolled pain. [name] does have some paranoia present; but is easily redirected. There are no reports of changes in appetite; weight is stable.   Past Medical History:  Diagnosis Date  . Anxiety   . Cellulitis 2016  . CHF (congestive heart failure) ([place])   . Chronic kidney disease   . Dementia ([place])   . Dysphagia   . Fracture of radial neck, right, closed 08/06/2019  . Gait abnormality   . GERD (gastroesophageal reflux disease)   . Glaucoma   . Gout, unspecified 01/12/2010   Qualifier: Diagnosis of  By: Moshe Cipro MD, Joycelyn Schmid    . Hyperlipidemia   . Hypertension   . Muscle weakness   . Myocardial infarction [facility]) [place]  . Osteoarthritis   . Syncope     Past Surgical History:  Procedure Laterality Date  . APPENDECTOMY    . CATARACT EXTRACTION W/PHACO  08/04/2011    Procedure: CATARACT EXTRACTION PHACO AND INTRAOCULAR LENS PLACEMENT (IOC);  Surgeon: Tonny Branch;  Location: [facility];  Service: Ophthalmology;  Laterality: Right;  CDE=18.53  . CATARACT EXTRACTION W/PHACO  08/25/2011   Procedure: CATARACT EXTRACTION PHACO AND INTRAOCULAR LENS PLACEMENT (IOC);  Surgeon: Tonny Branch;  Location: [facility];  Service: Ophthalmology;  Laterality: Left;  CDE:14.76  . TONSILLECTOMY      Social History   Socioeconomic History  . Marital status: Divorced    Spouse name: Not on file  . Number of children: Not on file  . Years of education: Not on file  . Highest education level: Not on file  Occupational History  . Occupation: retired   Tobacco Use  . Smoking status: Never  . Smokeless tobacco: Never  Vaping Use  . Vaping Use: Never used  Substance and Sexual Activity  . Alcohol use: No  . Drug use: No  . Sexual activity: Not Currently  Other Topics Concern  . Not on file  Social History Narrative   Long term resident [facility]    Social Determinants of Health   Financial Resource Strain: Not on file  Food Insecurity: Not on file  Transportation Needs: Not on file  Physical Activity: Not on file  Stress: Not on file  Social Connections: Not on file  Intimate Partner Violence: Not on file   Family History  Problem  Relation Age of Onset  . Diabetes Father   . Diabetes Sister   . Diabetes Brother   . Stroke Sister   . Stroke Sister   . Anesthesia problems Neg Hx   . Hypotension Neg Hx   . Malignant hyperthermia Neg Hx   . Pseudochol deficiency Neg Hx       VITAL SIGNS BP (!) 177/58   Pulse 64   Temp 98.2 F (36.8 C)   Resp 18   Ht 4\' 11"  (1.499 m)   Wt 133 lb (60.3 kg)   SpO2 98%   BMI 26.86 kg/m   Outpatient Encounter Medications as of 02/24/2021  Medication Sig  . acetaminophen (TYLENOL) 500 MG tablet Take 1,000 mg by mouth every 8 (eight) hours as needed. For leg pain and arthritis. May have additional 1000 mg for break through pain once  a day prn  . alum & mag hydroxide-simeth (MAALOX PLUS) 400-400-40 MG/5ML suspension Amt: 4 teaspoons; oral Special Instructions: for indigestion/heartburn Twice A Day - PRN PRN 1, PRN 2  . Alum & Mag Hydroxide-Simeth (MYLANTA PO) Take by mouth. amt: 4 tsp.; oral Special Instructions: after breakfast Once A Day 09:00 AM  . Balsam [place]-Castor Oil (VENELEX) OINT Apply topically to sacrum and bilateral buttocks every shift and as needed  . Brinzolamide-Brimonidine 1-0.2 % SUSP One  drop into both eyes three times a day  . Calcium Carbonate-Vitamin D (CALCIUM-VITAMIN D) 500-200 MG-UNIT tablet Take 1 tablet by mouth 2 (two) times daily.  . citalopram (CELEXA) 10 MG tablet Take 30 mg by mouth daily.   . cycloSPORINE (RESTASIS) 0.05 % ophthalmic emulsion Place 1 drop into both eyes 2 (two) times daily.   Marland Kitchen Dextromethorphan-guaiFENesin (ROBITUSSIN DM PO) 15 ml by mouth every 6 hours prn for cough/congestion. May give up to 48 hrs. Inform physician immediately if cough or congestion is associated with fever or SOB Every 6 Hours - PRN  . docusate sodium (COLACE) 100 MG capsule Take 100 mg by mouth daily.   . furosemide (LASIX) 40 MG tablet Take 40 mg by mouth daily.   . hydrALAZINE (APRESOLINE) 50 MG tablet Take 50 mg by mouth 2 (two) times a day.   . latanoprost (XALATAN) 0.005 % ophthalmic solution Place 1 drop into both eyes at bedtime.  Marland Kitchen loratadine (CLARITIN) 10 MG tablet Take 10 mg by mouth daily.  Marland Kitchen LORazepam (ATIVAN) 0.5 MG tablet Take 0.5 tablets (0.25 mg total) by mouth 2 (two) times daily.  . nitroGLYCERIN (NITROSTAT) 0.4 MG SL tablet Place 0.4 mg under the tongue every 5 (five) minutes as needed for chest pain.  . NON FORMULARY Diet Type:  Regular  . OXYGEN Inhale 2 L/min into the lungs continuous. Wean to keep sats above 90%  . pantoprazole (PROTONIX) 40 MG tablet Take 40 mg by mouth daily. Do not crush.  . potassium chloride SA (KLOR-CON) 20 MEQ tablet Take 20 mEq by mouth daily. DX  hypokalemia  . QUEtiapine (SEROQUEL) 25 MG tablet Take 12.5 mg by mouth 2 (two) times daily. Special Instructions: delusions; hallucinations   No facility-administered encounter medications on file as of 02/24/2021.     SIGNIFICANT DIAGNOSTIC EXAMS   PREVIOUS ;   03-21-20: chest x-ray: Mild congestive heart failure   NO NEW EXAMS.   LABS REVIEWED PREVIOUS:    02-27-20: glucose 84; bun 41; creat 1.29; k+ 3.9; na++ 142; ca 9.0  03-12-20: glucose 87; bun 45; creat 1.26; k+ 3.7; na++ 141; ca 9.0 03-18-20: tsh  1.548 vit B 12: 529 03-30-20: glucose 88; bun 26; creat 1.33; k+ 2.9; na++ 140 ca 9.0  03-31-20: k+ 3.6 07-08-20: wbc 4.3; hgb 10.9; hct 34.6; mcv 90.3 plt 190; glucose 89; bun 36; creat 1.42; k+ 3.8; na++ 142; ca 9.3 liver normal albumin 3.4 folate 14.7; vit B 12: 541 tsh 1.548 08-27-20: glucose 94; bun 30; creat 1.33; k+ 3.7; na++ 142; ca 9.1 GFR 37 08-31-20: glucose 127; bun 40; creat 1.33; k+ 4.1; na++ 138; ca 9.0; GFR 37  12-17-20: wbc 5.5; hgb 11.0; hct 34.1; mcv 89.3 plt 205; glucose 92; bun 51; creat 1.07; k+ 3.9; na++ 140; ca 9.3 GFR 48; liver normal albumin 3.0 vit B 12: 415  NO NEW LABS.   Review of Systems  Constitutional:  Negative for malaise/fatigue.  Respiratory:  Negative for cough and shortness of breath.   Cardiovascular:  Negative for chest pain, palpitations and leg swelling.  Gastrointestinal:  Negative for abdominal pain, constipation and heartburn.  Musculoskeletal:  Negative for back pain, joint pain and myalgias.  Skin: Negative.   Neurological:  Negative for dizziness.  Psychiatric/Behavioral:  The patient is not nervous/anxious.      Physical Exam Constitutional:      General: [name] is not in acute distress.    Appearance: [name] is well-developed. [name] is not diaphoretic.  Neck:     Thyroid: No thyromegaly.  Cardiovascular:     Rate and Rhythm: Regular rhythm. Bradycardia present.     Pulses: Normal pulses.     Heart sounds: Normal heart sounds.   Pulmonary:     Effort: Pulmonary effort is normal. No respiratory distress.     Breath sounds: Normal breath sounds.  Abdominal:     General: Bowel sounds are normal. There is no distension.     Palpations: Abdomen is soft.     Tenderness: There is no abdominal tenderness.  Musculoskeletal:        General: Normal range of motion.     Cervical back: Neck supple.     Right lower leg: No edema.     Left lower leg: No edema.  Lymphadenopathy:     Cervical: No cervical adenopathy.  Skin:    General: Skin is warm and dry.     Comments: Bilateral lower extremities discolored     Neurological:     Mental Status: [name] is alert. Mental status is at baseline.     Comments: 04-17-20: BCAT 14/45     Psychiatric:        Mood and Affect: Mood normal.    ASSESSMENT/ PLAN:  TODAY:   Hypertensive heart and kidney disease with chronic diastolic congestive heart failure and stage 3 chronic kidney disease: is stable b/p 138/79 (repeated) will continue apresoline 50 mg twice daily lasix 40 mg daily   2. GERD without esophagitis: is stable will continue protonix 40 mg daily   3. Chronic respiratory failure with hypoxia on home 02 therapy: is stable is on 02 will continue claritin 10 mg daily   4. Increased intraocular pressure bilateral: is stable will continue simbrinza three times daily travatan nightly to both eyes.    PREVIOUS   5. Hypokalemia: is stable k+ 3.9 will continue k+ 20 meq daily   6. Primary osteoarthritis multiple joints: is stable will continue tylenol 1 gm twice daily   7. Late onset alzheimer's disease with behavioral disturbance: is stable is off aricept will monitor weight is 133 pounds.  8. Major depression with psychotic features is stable  will continue celexa 30 mg daily (failed one wean); ativan 0.25 mg twice daily (has failed 2 weans) will continue seroquel 12.5 mg nightly  9. CKD (chronic kidney disease) stage 3 GFR 30-59; is stable bun 51; creat 1.07 GFR 48  10   Chronic constipation: is stable will continue colace twice daily   11. Chronic diastolic CHF (congestive heart failure) EF 65-70% (03-04-18) is stable will continue apresoline 50 mg twice daily lasix 40 mg daily has prn ntg  12. Deep vein thrombosis (DVT) of left lower extremity unspecified chronicity unspecified vein. Is stable will continue to monitor her status.   14. Chronic non-seasonal allergic rhinitis: is stable will continue claritin 10 mg daily       Ok Edwards NP [facility]  Contact [phone] Monday through Friday 8am- 5pm  After hours call [phone]

## 2021-03-04 ENCOUNTER — Other Ambulatory Visit (HOSPITAL_COMMUNITY)
Admission: RE | Admit: 2021-03-04 | Discharge: 2021-03-04 | Disposition: A | Discharge status: Home / Self Care | Payer: Medicare Other | Source: Skilled Nursing Facility | Attending: Adult Health | Admitting: Adult Health

## 2021-03-04 ENCOUNTER — Non-Acute Institutional Stay (SKILLED_NURSING_FACILITY): Payer: Medicare Other | Admitting: Adult Health

## 2021-03-04 ENCOUNTER — Encounter: Payer: Self-pay | Admitting: Adult Health

## 2021-03-04 DIAGNOSIS — F323 Major depressive disorder, single episode, severe with psychotic features: Secondary | ICD-10-CM

## 2021-03-04 DIAGNOSIS — I130 Hypertensive heart and chronic kidney disease with heart failure and stage 1 through stage 4 chronic kidney disease, or unspecified chronic kidney disease: Secondary | ICD-10-CM | POA: Insufficient documentation

## 2021-03-04 LAB — CBC
HCT: 38.6 % (ref 36.0–46.0)
Hemoglobin: 12.6 g/dL (ref 12.0–15.0)
MCH: 28.7 pg (ref 26.0–34.0)
MCHC: 32.6 g/dL (ref 30.0–36.0)
MCV: 87.9 fL (ref 80.0–100.0)
Platelets: 227 10*3/uL (ref 150–400)
RBC: 4.39 MIL/uL (ref 3.87–5.11)
RDW: 16.5 % — ABNORMAL HIGH (ref 11.5–15.5)
WBC: 5.4 10*3/uL (ref 4.0–10.5)
nRBC: 0 % (ref 0.0–0.2)

## 2021-03-04 LAB — COMPREHENSIVE METABOLIC PANEL
ALT: 16 U/L (ref 0–44)
AST: 27 U/L (ref 15–41)
Albumin: 3.4 g/dL — ABNORMAL LOW (ref 3.5–5.0)
Alkaline Phosphatase: 60 U/L (ref 38–126)
Anion gap: 8 (ref 5–15)
BUN: 35 mg/dL — ABNORMAL HIGH (ref 8–23)
CO2: 34 mmol/L — ABNORMAL HIGH (ref 22–32)
Calcium: 9.3 mg/dL (ref 8.9–10.3)
Chloride: 99 mmol/L (ref 98–111)
Creatinine, Ser: 1.15 mg/dL — ABNORMAL HIGH (ref 0.44–1.00)
GFR, Estimated: 43 mL/min — ABNORMAL LOW (ref >60)
Glucose, Bld: 111 mg/dL — ABNORMAL HIGH (ref 70–99)
Potassium: 3.8 mmol/L (ref 3.5–5.1)
Sodium: 141 mmol/L (ref 135–145)
Total Bilirubin: 0.5 mg/dL (ref 0.3–1.2)
Total Protein: 6.5 g/dL (ref 6.5–8.1)

## 2021-03-04 NOTE — Progress Notes (Signed)
Location:  [facility] Room Number: 122-D Place of Service:  SNF (31)   CODE STATUS: DNR  Allergies  Allergen Reactions  . Bee Venom Anaphylaxis  . Ace Inhibitors   . Angiotensin Receptor Blockers     Tongue swelling.  . Aspirin     Directed by physician  . Cabbage Other (See Comments)    Certain foods due to gout flares  . Latex Rash    Chief Complaint  Patient presents with  . Acute Visit    Hallucinations    HPI:  Staff report that she is having increased episodes of hallucinations. She thinks that her daughter is being killed. She is talking to the TV. She is seeing other people in her room. She unable to be redirected with her hallucinations. The nursing staff is concerned about her quality of life; and this issue is preventing her from having a good quality of life. She is presently taking seroquel 12.5 mg nightly   Past Medical History:  Diagnosis Date  . Anxiety   . Cellulitis 2016  . CHF (congestive heart failure) ([place])   . Chronic kidney disease   . Dementia ([place])   . Dysphagia   . Fracture of radial neck, right, closed 08/06/2019  . Gait abnormality   . GERD (gastroesophageal reflux disease)   . Glaucoma   . Gout, unspecified 01/12/2010   Qualifier: Diagnosis of  By: Moshe Cipro MD, Joycelyn Schmid    . Hyperlipidemia   . Hypertension   . Muscle weakness   . Myocardial infarction [facility] [place]  . Osteoarthritis   . Syncope     Past Surgical History:  Procedure Laterality Date  . APPENDECTOMY    . CATARACT EXTRACTION W/PHACO  08/04/2011   Procedure: CATARACT EXTRACTION PHACO AND INTRAOCULAR LENS PLACEMENT (IOC);  Surgeon: Tonny Branch;  Location: [facility];  Service: Ophthalmology;  Laterality: Right;  CDE=18.53  . CATARACT EXTRACTION W/PHACO  08/25/2011   Procedure: CATARACT EXTRACTION PHACO AND INTRAOCULAR LENS PLACEMENT (IOC);  Surgeon: Tonny Branch;  Location: [facility];  Service: Ophthalmology;  Laterality: Left;  CDE:14.76  .  TONSILLECTOMY      Social History   Socioeconomic History  . Marital status: Divorced    Spouse name: Not on file  . Number of children: Not on file  . Years of education: Not on file  . Highest education level: Not on file  Occupational History  . Occupation: retired   Tobacco Use  . Smoking status: Never  . Smokeless tobacco: Never  Vaping Use  . Vaping Use: Never used  Substance and Sexual Activity  . Alcohol use: No  . Drug use: No  . Sexual activity: Not Currently  Other Topics Concern  . Not on file  Social History Narrative   Long term resident [facility]    Social Determinants of Health   Financial Resource Strain: Not on file  Food Insecurity: Not on file  Transportation Needs: Not on file  Physical Activity: Not on file  Stress: Not on file  Social Connections: Not on file  Intimate Partner Violence: Not on file   Family History  Problem Relation Age of Onset  . Diabetes Father   . Diabetes Sister   . Diabetes Brother   . Stroke Sister   . Stroke Sister   . Anesthesia problems Neg Hx   . Hypotension Neg Hx   . Malignant hyperthermia Neg Hx   . Pseudochol deficiency Neg Hx  VITAL SIGNS BP (!) 119/54   Pulse 65   Temp (!) 97.5 F (36.4 C)   Resp 17   Ht 4\' 11"  (1.499 m)   Wt 133 lb (60.3 kg)   SpO2 91%   BMI 26.86 kg/m   Outpatient Encounter Medications as of 03/04/2021  Medication Sig  . acetaminophen (TYLENOL) 500 MG tablet Take 1,000 mg by mouth every 8 (eight) hours as needed. For leg pain and arthritis. May have additional 1000 mg for break through pain once a day prn  . alum & mag hydroxide-simeth (MAALOX PLUS) 400-400-40 MG/5ML suspension Amt: 4 teaspoons; oral Special Instructions: for indigestion/heartburn Twice A Day - PRN PRN 1, PRN 2  . Alum & Mag Hydroxide-Simeth (MYLANTA PO) Take by mouth. amt: 4 tsp.; oral Special Instructions: after breakfast Once A Day 09:00 AM  . Balsam [place]-Castor Oil (VENELEX) OINT Apply topically to  sacrum and bilateral buttocks every shift and as needed  . Brinzolamide-Brimonidine 1-0.2 % SUSP One  drop into both eyes three times a day  . Calcium Carbonate-Vitamin D (CALCIUM-VITAMIN D) 500-200 MG-UNIT tablet Take 1 tablet by mouth 2 (two) times daily.  . citalopram (CELEXA) 10 MG tablet Take 30 mg by mouth daily.   . cycloSPORINE (RESTASIS) 0.05 % ophthalmic emulsion Place 1 drop into both eyes 2 (two) times daily.   Marland Kitchen Dextromethorphan-guaiFENesin (ROBITUSSIN DM PO) 15 ml by mouth every 6 hours prn for cough/congestion. May give up to 48 hrs. Inform physician immediately if cough or congestion is associated with fever or SOB Every 6 Hours - PRN  . docusate sodium (COLACE) 100 MG capsule Take 100 mg by mouth daily.   . furosemide (LASIX) 40 MG tablet Take 40 mg by mouth daily.   . hydrALAZINE (APRESOLINE) 50 MG tablet Take 50 mg by mouth 2 (two) times a day.   . latanoprost (XALATAN) 0.005 % ophthalmic solution Place 1 drop into both eyes at bedtime.  Marland Kitchen loratadine (CLARITIN) 10 MG tablet Take 10 mg by mouth daily.  Marland Kitchen LORazepam (ATIVAN) 0.5 MG tablet Take 0.5 tablets (0.25 mg total) by mouth 2 (two) times daily.  . nitroGLYCERIN (NITROSTAT) 0.4 MG SL tablet Place 0.4 mg under the tongue every 5 (five) minutes as needed for chest pain.  . NON FORMULARY Diet Type:  Regular  . OXYGEN Inhale 2 L/min into the lungs continuous. Wean to keep sats above 90%  . pantoprazole (PROTONIX) 40 MG tablet Take 40 mg by mouth daily. Do not crush.  . potassium chloride SA (KLOR-CON) 20 MEQ tablet Take 20 mEq by mouth daily. DX hypokalemia  . QUEtiapine (SEROQUEL) 25 MG tablet Take 12.5 mg by mouth 2 (two) times daily. Special Instructions: delusions; hallucinations   No facility-administered encounter medications on file as of 03/04/2021.     SIGNIFICANT DIAGNOSTIC EXAMS  PREVIOUS ;   03-21-20: chest x-ray: Mild congestive heart failure   NO NEW EXAMS.   LABS REVIEWED PREVIOUS:    02-27-20: glucose 84;  bun 41; creat 1.29; k+ 3.9; na++ 142; ca 9.0  03-12-20: glucose 87; bun 45; creat 1.26; k+ 3.7; na++ 141; ca 9.0 03-18-20: tsh 1.548 vit B 12: 529 03-30-20: glucose 88; bun 26; creat 1.33; k+ 2.9; na++ 140 ca 9.0  03-31-20: k+ 3.6 07-08-20: wbc 4.3; hgb 10.9; hct 34.6; mcv 90.3 plt 190; glucose 89; bun 36; creat 1.42; k+ 3.8; na++ 142; ca 9.3 liver normal albumin 3.4 folate 14.7; vit B 12: 541 tsh 1.548 08-27-20: glucose 94; bun  30; creat 1.33; k+ 3.7; na++ 142; ca 9.1 GFR 37 08-31-20: glucose 127; bun 40; creat 1.33; k+ 4.1; na++ 138; ca 9.0; GFR 37  12-17-20: wbc 5.5; hgb 11.0; hct 34.1; mcv 89.3 plt 205; glucose 92; bun 51; creat 1.07; k+ 3.9; na++ 140; ca 9.3 GFR 48; liver normal albumin 3.0 vit B 12: 415  NO NEW LABS.   Review of Systems  Constitutional:  Negative for malaise/fatigue.  Respiratory:  Negative for cough and shortness of breath.   Cardiovascular:  Negative for chest pain, palpitations and leg swelling.  Gastrointestinal:  Negative for abdominal pain, constipation and heartburn.  Musculoskeletal:  Negative for back pain, joint pain and myalgias.  Skin: Negative.   Neurological:  Negative for dizziness.  Psychiatric/Behavioral:  The patient is not nervous/anxious.        She states that there are other people in the room in a corner    Physical Exam Constitutional:      General: She is not in acute distress.    Appearance: She is well-developed. She is not diaphoretic.  Neck:     Thyroid: No thyromegaly.  Cardiovascular:     Rate and Rhythm: Regular rhythm. Bradycardia present.     Pulses: Normal pulses.     Heart sounds: Normal heart sounds.  Pulmonary:     Effort: Pulmonary effort is normal. No respiratory distress.     Breath sounds: Normal breath sounds.  Abdominal:     General: Bowel sounds are normal. There is no distension.     Palpations: Abdomen is soft.     Tenderness: There is no abdominal tenderness.  Musculoskeletal:        General: Normal range of  motion.     Cervical back: Neck supple.     Right lower leg: No edema.     Left lower leg: No edema.  Lymphadenopathy:     Cervical: No cervical adenopathy.  Skin:    General: Skin is warm and dry.     Comments: Bilateral lower extremities discolored      Neurological:     Mental Status: She is alert. Mental status is at baseline.     Comments: 04-17-20: BCAT 14/45   Psychiatric:        Mood and Affect: Mood normal.      ASSESSMENT/ PLAN:  TODAY  Major depression with psychotic features  Is worse: will increase seroquel to 12.5 mg twice daily and will monitor her response.    Ok Edwards NP [facility] Adult [facility]  Contact [phone] Monday through Friday 8am- 5pm  After hours call [phone]

## 2021-03-10 ENCOUNTER — Other Ambulatory Visit: Payer: Self-pay | Admitting: Adult Health

## 2021-03-10 MED ORDER — LORAZEPAM 0.5 MG PO TABS: 0.2500 mg | ORAL_TABLET | Freq: Two times a day (BID) | ORAL | 0 refills | Status: DC

## 2021-03-26 ENCOUNTER — Non-Acute Institutional Stay (SKILLED_NURSING_FACILITY): Payer: Medicare Other | Admitting: Adult Health

## 2021-03-26 ENCOUNTER — Encounter: Payer: Self-pay | Admitting: Adult Health

## 2021-03-26 DIAGNOSIS — M170 Bilateral primary osteoarthritis of knee: Secondary | ICD-10-CM

## 2021-03-26 DIAGNOSIS — G301 Alzheimer's disease with late onset: Secondary | ICD-10-CM

## 2021-03-26 DIAGNOSIS — F323 Major depressive disorder, single episode, severe with psychotic features: Secondary | ICD-10-CM

## 2021-03-26 DIAGNOSIS — E876 Hypokalemia: Secondary | ICD-10-CM

## 2021-03-26 DIAGNOSIS — F0281 Dementia in other diseases classified elsewhere with behavioral disturbance: Secondary | ICD-10-CM

## 2021-03-26 NOTE — Progress Notes (Signed)
Location:  [place] Room Number: 122-D Place of Service:  SNF (31)   CODE STATUS: DNR  Allergies  Allergen Reactions  . Bee Venom Anaphylaxis  . Ace Inhibitors   . Angiotensin Receptor Blockers     Tongue swelling.  . Aspirin     Directed by physician  . Cabbage Other (See Comments)    Certain foods due to gout flares  . Latex Rash    Chief Complaint  Patient presents with  . Medical Management of Chronic Issues           Hypokalemia:    Primary osteoarthritis multiple joints:    Late onset alzheimer's disease with behavioral disturbance:  Major depression with psychotic features    HPI:  She is a 87 year old long term resident of this facility being seen for the management of her chronic illnesses:  Hypokalemia:    Primary osteoarthritis multiple joints:    Late onset alzheimer's disease with behavioral disturbance:  Major depression with psychotic features. There are no reports of uncontrolled pain. Her mood has improved since starting increased dose of seroquel. There are no reports of insomnia. She does spend most of her time in bed per her choice.   Past Medical History:  Diagnosis Date  . Anxiety   . Cellulitis 2016  . CHF (congestive heart failure) ([place])   . Chronic kidney disease   . Dementia ([place])   . Dysphagia   . Fracture of radial neck, right, closed 08/06/2019  . Gait abnormality   . GERD (gastroesophageal reflux disease)   . Glaucoma   . Gout, unspecified 01/12/2010   Qualifier: Diagnosis of  By: Moshe Cipro MD, Joycelyn Schmid    . Hyperlipidemia   . Hypertension   . Muscle weakness   . Myocardial infarction [facility]) [place]  . Osteoarthritis   . Syncope     Past Surgical History:  Procedure Laterality Date  . APPENDECTOMY    . CATARACT EXTRACTION W/PHACO  08/04/2011   Procedure: CATARACT EXTRACTION PHACO AND INTRAOCULAR LENS PLACEMENT (IOC);  Surgeon: Tonny Branch;  Location: [facility];  Service: Ophthalmology;  Laterality: Right;   CDE=18.53  . CATARACT EXTRACTION W/PHACO  08/25/2011   Procedure: CATARACT EXTRACTION PHACO AND INTRAOCULAR LENS PLACEMENT (IOC);  Surgeon: Tonny Branch;  Location: [facility];  Service: Ophthalmology;  Laterality: Left;  CDE:14.76  . TONSILLECTOMY      Social History   Socioeconomic History  . Marital status: Divorced    Spouse name: Not on file  . Number of children: Not on file  . Years of education: Not on file  . Highest education level: Not on file  Occupational History  . Occupation: retired   Tobacco Use  . Smoking status: Never  . Smokeless tobacco: Never  Vaping Use  . Vaping Use: Never used  Substance and Sexual Activity  . Alcohol use: No  . Drug use: No  . Sexual activity: Not Currently  Other Topics Concern  . Not on file  Social History Narrative   Long term resident [facility]    Social Determinants of Health   Financial Resource Strain: Not on file  Food Insecurity: Not on file  Transportation Needs: Not on file  Physical Activity: Not on file  Stress: Not on file  Social Connections: Not on file  Intimate Partner Violence: Not on file   Family History  Problem Relation Age of Onset  . Diabetes Father   . Diabetes Sister   .  Diabetes Brother   . Stroke Sister   . Stroke Sister   . Anesthesia problems Neg Hx   . Hypotension Neg Hx   . Malignant hyperthermia Neg Hx   . Pseudochol deficiency Neg Hx       VITAL SIGNS BP (!) 176/78   Pulse 65   Temp 98.2 F (36.8 C)   Resp 18   Ht '4\' 11"'$  (1.499 m)   Wt 133 lb (60.3 kg)   SpO2 95%   BMI 26.86 kg/m   Outpatient Encounter Medications as of 03/26/2021  Medication Sig  . acetaminophen (TYLENOL) 500 MG tablet Take 1,000 mg by mouth every 8 (eight) hours as needed. For leg pain and arthritis. May have additional 1000 mg for break through pain once a day prn  . alum & mag hydroxide-simeth (MAALOX PLUS) 400-400-40 MG/5ML suspension Amt: 4 teaspoons; oral Special Instructions: for  indigestion/heartburn Twice A Day - PRN PRN 1, PRN 2  . Alum & Mag Hydroxide-Simeth (MYLANTA PO) Take by mouth. amt: 4 tsp.; oral Special Instructions: after breakfast Once A Day 09:00 AM  . Balsam [place]-Castor Oil (VENELEX) OINT Apply topically to sacrum and bilateral buttocks every shift and as needed  . Brinzolamide-Brimonidine 1-0.2 % SUSP One  drop into both eyes three times a day  . Calcium Carbonate-Vitamin D (CALCIUM-VITAMIN D) 500-200 MG-UNIT tablet Take 1 tablet by mouth 2 (two) times daily.  . citalopram (CELEXA) 10 MG tablet Take 30 mg by mouth daily.   . cycloSPORINE (RESTASIS) 0.05 % ophthalmic emulsion Place 1 drop into both eyes 2 (two) times daily.   Marland Kitchen Dextromethorphan-guaiFENesin (ROBITUSSIN DM PO) 15 ml by mouth every 6 hours prn for cough/congestion. May give up to 48 hrs. Inform physician immediately if cough or congestion is associated with fever or SOB Every 6 Hours - PRN  . docusate sodium (COLACE) 100 MG capsule Take 100 mg by mouth daily.   . furosemide (LASIX) 40 MG tablet Take 40 mg by mouth daily.   . hydrALAZINE (APRESOLINE) 50 MG tablet Take 50 mg by mouth 2 (two) times a day.   . latanoprost (XALATAN) 0.005 % ophthalmic solution Place 1 drop into both eyes at bedtime.  Marland Kitchen loratadine (CLARITIN) 10 MG tablet Take 10 mg by mouth daily.  Marland Kitchen LORazepam (ATIVAN) 0.5 MG tablet Take 0.5 tablets (0.25 mg total) by mouth 2 (two) times daily.  . nitroGLYCERIN (NITROSTAT) 0.4 MG SL tablet Place 0.4 mg under the tongue every 5 (five) minutes as needed for chest pain.  . NON FORMULARY Diet Type:  Regular  . OXYGEN Inhale 2 L/min into the lungs continuous. Wean to keep sats above 90%  . pantoprazole (PROTONIX) 40 MG tablet Take 40 mg by mouth daily. Do not crush.  . potassium chloride SA (KLOR-CON) 20 MEQ tablet Take 20 mEq by mouth daily. DX hypokalemia  . QUEtiapine (SEROQUEL) 25 MG tablet Take 12.5 mg by mouth 2 (two) times daily. Special Instructions: delusions;  hallucinations   No facility-administered encounter medications on file as of 03/26/2021.     SIGNIFICANT DIAGNOSTIC EXAMS   PREVIOUS ;   03-21-20: chest x-ray: Mild congestive heart failure   NO NEW EXAMS.   LABS REVIEWED PREVIOUS:    02-27-20: glucose 84; bun 41; creat 1.29; k+ 3.9; na++ 142; ca 9.0  03-12-20: glucose 87; bun 45; creat 1.26; k+ 3.7; na++ 141; ca 9.0 03-18-20: tsh 1.548 vit B 12: 529 03-30-20: glucose 88; bun 26; creat 1.33; k+ 2.9; na++ 140  ca 9.0  03-31-20: k+ 3.6 07-08-20: wbc 4.3; hgb 10.9; hct 34.6; mcv 90.3 plt 190; glucose 89; bun 36; creat 1.42; k+ 3.8; na++ 142; ca 9.3 liver normal albumin 3.4 folate 14.7; vit B 12: 541 tsh 1.548 08-27-20: glucose 94; bun 30; creat 1.33; k+ 3.7; na++ 142; ca 9.1 GFR 37 08-31-20: glucose 127; bun 40; creat 1.33; k+ 4.1; na++ 138; ca 9.0; GFR 37  12-17-20: wbc 5.5; hgb 11.0; hct 34.1; mcv 89.3 plt 205; glucose 92; bun 51; creat 1.07; k+ 3.9; na++ 140; ca 9.3 GFR 48; liver normal albumin 3.0 vit B 12: 415  TODAY  03-04-21: wbc 5.4; hgb 12.6; hct 38.6; mcv 87.9 plt 227; glucose 111; bun 35; creat 1.15; k+ 3.8; na++ 141; ca 9.3 GFR 43; liver normal albumin 3.4   Review of Systems  Constitutional:  Negative for malaise/fatigue.  Respiratory:  Negative for cough and shortness of breath.   Cardiovascular:  Negative for chest pain, palpitations and leg swelling.  Gastrointestinal:  Negative for abdominal pain, constipation and heartburn.  Musculoskeletal:  Negative for back pain, joint pain and myalgias.  Skin: Negative.   Neurological:  Negative for dizziness.  Psychiatric/Behavioral:  The patient is not nervous/anxious.    Physical Exam Constitutional:      General: She is not in acute distress.    Appearance: She is well-developed. She is not diaphoretic.  Neck:     Thyroid: No thyromegaly.  Cardiovascular:     Rate and Rhythm: Regular rhythm. Bradycardia present.     Heart sounds: Normal heart sounds.  Pulmonary:     Effort:  Pulmonary effort is normal. No respiratory distress.     Breath sounds: Normal breath sounds.  Abdominal:     General: Bowel sounds are normal. There is no distension.     Palpations: Abdomen is soft.     Tenderness: There is no abdominal tenderness.  Musculoskeletal:     Cervical back: Neck supple.     Right lower leg: No edema.     Left lower leg: No edema.     Comments: Is able to move all extremities   Lymphadenopathy:     Cervical: No cervical adenopathy.  Skin:    General: Skin is warm and dry.     Comments:  Bilateral lower extremities discolored       Neurological:     Mental Status: She is alert. Mental status is at baseline.     Comments: 04-17-20: BCAT 14/45    Psychiatric:        Mood and Affect: Mood normal.      ASSESSMENT/ PLAN:  TODAY:   Hypokalemia: k+ 3.8 will continue k+ 20 meq daily   2. Primary osteoarthritis multiple joints: is stable is taking tylenol 1 gm twice daily   3. Late onset alzheimer's disease with behavioral disturbance: is without change is off aricept; weight is stable at 133 pounds.   4. Major depression with psychotic features is stable will continue celexa 30 mg daily (failed one wean); ativan 0.25 mg twice daily (has failed 2 weans) will continue seroquel 12.5 mg twice daily    PREVIOUS   5. CKD (chronic kidney disease) stage 3 GFR 30-59; is stable bun 35; creat 1.15 GFR 43  6  Chronic constipation: is stable will continue colace twice daily   7. Chronic diastolic CHF (congestive heart failure) EF 65-70% (03-04-18) is stable will continue apresoline 50 mg twice daily lasix 40 mg daily has prn ntg  8.  Deep vein thrombosis (DVT) of left lower extremity unspecified chronicity unspecified vein. Is stable will continue to monitor her status.   9. Chronic non-seasonal allergic rhinitis: is stable will continue claritin 10 mg daily   10. Hypertensive heart and kidney disease with chronic diastolic congestive heart failure and stage 3  chronic kidney disease: is stable b/p 138/79 (repeated) will continue apresoline 50 mg twice daily lasix 40 mg daily   11. GERD without esophagitis: is stable will continue protonix 40 mg daily   12. Chronic respiratory failure with hypoxia on home 02 therapy: is stable is on 02 will continue claritin 10 mg daily   13. Increased intraocular pressure bilateral: is stable will continue simbrinza three times daily travatan nightly to both eyes.    Ok Edwards NP [facility]  Contact [phone] Monday through Friday 8am- 5pm  After hours call [phone]

## 2021-04-05 ENCOUNTER — Other Ambulatory Visit: Payer: Self-pay | Admitting: Adult Health

## 2021-04-05 MED ORDER — LORAZEPAM 0.5 MG PO TABS: 0.2500 mg | ORAL_TABLET | Freq: Two times a day (BID) | ORAL | 0 refills | Status: DC

## 2021-04-20 ENCOUNTER — Non-Acute Institutional Stay (SKILLED_NURSING_FACILITY): Payer: Medicare Other | Admitting: Adult Health

## 2021-04-20 ENCOUNTER — Encounter: Payer: Self-pay | Admitting: Adult Health

## 2021-04-20 DIAGNOSIS — I5032 Chronic diastolic (congestive) heart failure: Secondary | ICD-10-CM | POA: Diagnosis not present

## 2021-04-20 DIAGNOSIS — R627 Adult failure to thrive: Secondary | ICD-10-CM | POA: Diagnosis not present

## 2021-04-20 DIAGNOSIS — F0281 Dementia in other diseases classified elsewhere with behavioral disturbance: Secondary | ICD-10-CM

## 2021-04-20 DIAGNOSIS — G301 Alzheimer's disease with late onset: Secondary | ICD-10-CM | POA: Diagnosis not present

## 2021-04-20 DIAGNOSIS — F02818 Dementia in other diseases classified elsewhere, unspecified severity, with other behavioral disturbance: Secondary | ICD-10-CM

## 2021-04-20 NOTE — Progress Notes (Signed)
Location:  [place] Room Number: 24 Place of Service:  SNF (31)   CODE STATUS: dnr  Allergies  Allergen Reactions   Bee Venom Anaphylaxis   Ace Inhibitors    Angiotensin Receptor Blockers     Tongue swelling.   Aspirin     Directed by physician   Cabbage Other (See Comments)    Certain foods due to gout flares   Latex Rash    Chief Complaint  Patient presents with   Acute Visit    Change in status     HPI:  She has been less responsive over the past several days. Her family would like to change the focus of her care to comfort only. They do not desire hospice care at this time. There are no reports of fevers. Her po intake is poor at this time. Her daughter is at bedside. There are no indications of distress present.   Past Medical History:  Diagnosis Date   Anxiety    Cellulitis 2016   CHF (congestive heart failure) ([facility])    Chronic kidney disease    Dementia (H[facility])    Dysphagia    Fracture of radial neck, right, closed 08/06/2019   Gait abnormality    GERD (gastroesophageal reflux disease)    Glaucoma    Gout, unspecified 01/12/2010   Qualifier: Diagnosis of  By: Moshe Cipro MD, Margaret     Hyperlipidemia    Hypertension    Muscle weakness    Myocardial infarction ([place]) 1990s   [place]   Osteoarthritis    Syncope     Past Surgical History:  Procedure Laterality Date   APPENDECTOMY     CATARACT EXTRACTION W/PHACO  08/04/2011   Procedure: CATARACT EXTRACTION PHACO AND INTRAOCULAR LENS PLACEMENT ([place]);  Surgeon: Tonny Branch;  Location: [facility];  Service: Ophthalmology;  Laterality: Right;  CDE=18.53   CATARACT EXTRACTION W/PHACO  08/25/2011   Procedure: CATARACT EXTRACTION PHACO AND INTRAOCULAR LENS PLACEMENT (IOC);  Surgeon: Tonny Branch;  Location: [facility];  Service: Ophthalmology;  Laterality: Left;  CDE:14.76   TONSILLECTOMY      Social History   Socioeconomic History   Marital status: Divorced    Spouse name: Not on file    Number of children: Not on file   Years of education: Not on file   Highest education level: Not on file  Occupational History   Occupation: retired   Tobacco Use   Smoking status: Never   Smokeless tobacco: Never  Vaping Use   Vaping Use: Never used  Substance and Sexual Activity   Alcohol use: No   Drug use: No   Sexual activity: Not Currently  Other Topics Concern   Not on file  Social History Narrative   Long term resident [facility]    Social Determinants of Health   Financial Resource Strain: Not on file  Food Insecurity: Not on file  Transportation Needs: Not on file  Physical Activity: Not on file  Stress: Not on file  Social Connections: Not on file  Intimate Partner Violence: Not on file   Family History  Problem Relation Age of Onset   Diabetes Father    Diabetes Sister    Diabetes Brother    Stroke Sister    Stroke Sister    Anesthesia problems Neg Hx    Hypotension Neg Hx    Malignant hyperthermia Neg Hx    Pseudochol deficiency Neg Hx       VITAL SIGNS BP 114/61  Pulse 80   Temp (!) 97.4 F (36.3 C)   Ht '4\' 11"'$  (1.499 m)   Wt 131 lb 6.4 oz (59.6 kg)   BMI 26.54 kg/m   Outpatient Encounter Medications as of 04/20/2021  Medication Sig   acetaminophen (TYLENOL) 500 MG tablet Take 1,000 mg by mouth every 8 (eight) hours as needed. For leg pain and arthritis. May have additional 1000 mg for break through pain once a day prn   alum & mag hydroxide-simeth (MAALOX PLUS) 400-400-40 MG/5ML suspension Amt: 4 teaspoons; oral Special Instructions: for indigestion/heartburn Twice A Day - PRN PRN 1, PRN 2   Alum & Mag Hydroxide-Simeth (MYLANTA PO) Take by mouth. amt: 4 tsp.; oral Special Instructions: after breakfast Once A Day 09:00 AM   Balsam [place]-Castor Oil (VENELEX) OINT Apply topically to sacrum and bilateral buttocks every shift and as needed   Brinzolamide-Brimonidine 1-0.2 % SUSP One  drop into both eyes three times a day   Calcium Carbonate-Vitamin  D (CALCIUM-VITAMIN D) 500-200 MG-UNIT tablet Take 1 tablet by mouth 2 (two) times daily.   citalopram (CELEXA) 10 MG tablet Take 30 mg by mouth daily.    cycloSPORINE (RESTASIS) 0.05 % ophthalmic emulsion Place 1 drop into both eyes 2 (two) times daily.    Dextromethorphan-guaiFENesin (ROBITUSSIN DM PO) 15 ml by mouth every 6 hours prn for cough/congestion. May give up to 48 hrs. Inform physician immediately if cough or congestion is associated with fever or SOB Every 6 Hours - PRN   docusate sodium (COLACE) 100 MG capsule Take 100 mg by mouth daily.    furosemide (LASIX) 40 MG tablet Take 40 mg by mouth daily.    hydrALAZINE (APRESOLINE) 50 MG tablet Take 50 mg by mouth 2 (two) times a day.    latanoprost (XALATAN) 0.005 % ophthalmic solution Place 1 drop into both eyes at bedtime.   loratadine (CLARITIN) 10 MG tablet Take 10 mg by mouth daily.   LORazepam (ATIVAN) 0.5 MG tablet Take 0.5 tablets (0.25 mg total) by mouth 2 (two) times daily.   nitroGLYCERIN (NITROSTAT) 0.4 MG SL tablet Place 0.4 mg under the tongue every 5 (five) minutes as needed for chest pain.   NON FORMULARY Diet Type:  Regular   OXYGEN Inhale 2 L/min into the lungs continuous. Wean to keep sats above 90%   pantoprazole (PROTONIX) 40 MG tablet Take 40 mg by mouth daily. Do not crush.   potassium chloride SA (KLOR-CON) 20 MEQ tablet Take 20 mEq by mouth daily. DX hypokalemia   QUEtiapine (SEROQUEL) 25 MG tablet Take 12.5 mg by mouth 2 (two) times daily. Special Instructions: delusions; hallucinations   No facility-administered encounter medications on file as of 04/20/2021.     SIGNIFICANT DIAGNOSTIC EXAMS   PREVIOUS ;   03-21-20: chest x-ray: Mild congestive heart failure   NO NEW EXAMS.   LABS REVIEWED PREVIOUS:    03-30-20: glucose 88; bun 26; creat 1.33; k+ 2.9; na++ 140 ca 9.0  03-31-20: k+ 3.6 07-08-20: wbc 4.3; hgb 10.9; hct 34.6; mcv 90.3 plt 190; glucose 89; bun 36; creat 1.42; k+ 3.8; na++ 142; ca 9.3 liver  normal albumin 3.4 folate 14.7; vit B 12: 541 tsh 1.548 08-27-20: glucose 94; bun 30; creat 1.33; k+ 3.7; na++ 142; ca 9.1 GFR 37 08-31-20: glucose 127; bun 40; creat 1.33; k+ 4.1; na++ 138; ca 9.0; GFR 37  12-17-20: wbc 5.5; hgb 11.0; hct 34.1; mcv 89.3 plt 205; glucose 92; bun 51; creat 1.07; k+ 3.9;  na++ 140; ca 9.3 GFR 48; liver normal albumin 3.0 vit B 12: 415 03-04-21: wbc 5.4; hgb 12.6; hct 38.6; mcv 87.9 plt 227; glucose 111; bun 35; creat 1.15; k+ 3.8; na++ 141; ca 9.3 GFR 43; liver normal albumin 3.4   NO NEW LABS.   Review of Systems  Unable to perform ROS: Dementia (unable to participate)   Physical Exam Constitutional:      General: She is not in acute distress.    Appearance: She is well-developed. She is not diaphoretic.  Neck:     Thyroid: No thyromegaly.  Cardiovascular:     Rate and Rhythm: Regular rhythm. Bradycardia present.     Pulses: Normal pulses.     Heart sounds: Normal heart sounds.  Pulmonary:     Effort: Pulmonary effort is normal. No respiratory distress.     Breath sounds: Normal breath sounds.  Abdominal:     General: Bowel sounds are normal. There is no distension.     Palpations: Abdomen is soft.     Tenderness: There is no abdominal tenderness.  Musculoskeletal:     Cervical back: Neck supple.     Right lower leg: No edema.     Left lower leg: No edema.  Lymphadenopathy:     Cervical: No cervical adenopathy.  Skin:    General: Skin is warm and dry.     Comments: Bilateral lower extremities discolored   Neurological:     Comments: Is aware      ASSESSMENT/ PLAN:  TODAY  Failure to thrive in adult Late onset alzheimer's dementia with behavioral disturbance Chronic diastolic congestive heart failure   Will begin comfort care: no hospitalizations; no lab work no IVF. Will stop calcium and routine mylanta.  Will hold seroquel for sedation.  Will monitor her status.    Ok Edwards NP [facility]  Contact [phone]  Monday through Friday 8am- 5pm  After hours call [phone]

## 2021-04-28 ENCOUNTER — Non-Acute Institutional Stay (SKILLED_NURSING_FACILITY): Payer: Medicare Other | Admitting: Internal Medicine

## 2021-04-28 ENCOUNTER — Encounter: Payer: Self-pay | Admitting: Internal Medicine

## 2021-04-28 DIAGNOSIS — N1832 Chronic kidney disease, stage 3b: Secondary | ICD-10-CM | POA: Diagnosis not present

## 2021-04-28 DIAGNOSIS — D649 Anemia, unspecified: Secondary | ICD-10-CM | POA: Diagnosis not present

## 2021-04-28 DIAGNOSIS — F02818 Dementia in other diseases classified elsewhere, unspecified severity, with other behavioral disturbance: Secondary | ICD-10-CM

## 2021-04-28 DIAGNOSIS — I10 Essential (primary) hypertension: Secondary | ICD-10-CM

## 2021-04-28 DIAGNOSIS — G301 Alzheimer's disease with late onset: Secondary | ICD-10-CM

## 2021-04-28 DIAGNOSIS — F0281 Dementia in other diseases classified elsewhere with behavioral disturbance: Secondary | ICD-10-CM

## 2021-04-28 NOTE — Assessment & Plan Note (Signed)
Current creatinine 1.15 / GFR 43  ; CKD Stage 3b Medication List reviewed. No nephrotoxic agents identified.

## 2021-04-28 NOTE — Progress Notes (Signed)
   NURSING HOME LOCATION:  [facility] ROOM NUMBER:  [id number] D  CODE STATUS: DNR   PCP:  Ok Edwards NP  This is a nursing facility follow up visit of chronic medical diagnoses & to document compliance with Regulation 483.30 (c) in [employer] [place] Manual Phase 2 which mandates caregiver visit ( visits can alternate among physician, PA or NP as per statutes) within 10 days of 30 days / 60 days/ 90 days post admission to SNF date    Interim medical record and care since last SNF visit was updated with review of diagnostic studies and change in clinical status since last visit were documented.  HPI: She is a permanent resident of the facility with diagnoses of history of MI, dyslipidemia, essential hypertension, history of syncope, history of gout, glaucoma, CKD, GERD, history of CHF, and dementia.  Review of systems: Dementia invalidated responses. She denied any active symptoms stating "I feel good".  She confabulated nonsensically but was smiling and friendly throughout the interview and exam.  Unfortunately she could not follow commands appropriately.  Physical exam:  Pertinent or positive findings: She appears younger than her stated age.  Hair is closely trimmed. She has bilateral ptosis, greater on the left.  As noted speech is garbled.  She is edentulous and not wearing dentures.  There is a large pigmented nevus in the left medial malar area.  Grade 1/2 systolic murmur is noted at left sternal border.  Breath sounds are decreased.  Pedal pulses not palpable.  She has diffuse hyperpigmented keratotic changes over the shins.  The right thumbnail and index nail are deformed.  General appearance: Adequately nourished; no acute distress, increased work of breathing is present.   Lymphatic: No lymphadenopathy about the head, neck, axilla. Eyes: No conjunctival inflammation or lid edema is present. There is no scleral icterus. Ears:  External ear exam shows no  significant lesions or deformities.   Nose:  External nasal examination shows no deformity or inflammation. Nasal mucosa are pink and moist without lesions, exudates Oral exam:  Lips and gums are healthy appearing. There is no oropharyngeal erythema or exudate. Neck:  No thyromegaly, masses, tenderness noted.    Heart:  Normal rate and regular rhythm. S1 and S2 normal without gallop, click, rub .  Lungs:  without wheezes, rhonchi, rales, rubs. Abdomen: Bowel sounds are normal. Abdomen is soft and nontender with no organomegaly, hernias, masses. GU: Deferred  Extremities:  No cyanosis, clubbing, edema  Neurologic exam :Balance, Rhomberg, finger to nose testing could not be completed due to clinical state Skin: Warm & dry w/o tenting. No significant rash.  See summary under each active problem in the Problem List with associated updated therapeutic plan

## 2021-04-28 NOTE — Assessment & Plan Note (Signed)
Current H/H 12.6/38.6;prior H/H 11/34.1 Indices normochromic/ normocytic No bleeding dyscrasias reported by Staff Anemia resolved

## 2021-04-28 NOTE — Assessment & Plan Note (Signed)
Today she is pleasantly confused and confabulating.  Staff states that she refuses to get out of bed.

## 2021-04-28 NOTE — Assessment & Plan Note (Addendum)
BP record reviewed ; overall BP controlled. No change in antihypertensive medications as today's reading an outlier

## 2021-04-29 ENCOUNTER — Other Ambulatory Visit: Payer: Self-pay | Admitting: Adult Health

## 2021-04-29 MED ORDER — LORAZEPAM 0.5 MG PO TABS: 0.2500 mg | ORAL_TABLET | Freq: Two times a day (BID) | ORAL | 0 refills | Status: DC

## 2021-04-29 NOTE — Patient Instructions (Signed)
See assessment and plan under each diagnosis in the problem list and acutely for this visit 

## 2021-05-06 ENCOUNTER — Non-Acute Institutional Stay (SKILLED_NURSING_FACILITY): Payer: Medicare Other | Admitting: Adult Health

## 2021-05-06 ENCOUNTER — Encounter: Payer: Self-pay | Admitting: Adult Health

## 2021-05-06 DIAGNOSIS — I5032 Chronic diastolic (congestive) heart failure: Secondary | ICD-10-CM | POA: Diagnosis not present

## 2021-05-06 DIAGNOSIS — Z9981 Dependence on supplemental oxygen: Secondary | ICD-10-CM

## 2021-05-06 DIAGNOSIS — G301 Alzheimer's disease with late onset: Secondary | ICD-10-CM

## 2021-05-06 DIAGNOSIS — N1832 Chronic kidney disease, stage 3b: Secondary | ICD-10-CM

## 2021-05-06 DIAGNOSIS — J9611 Chronic respiratory failure with hypoxia: Secondary | ICD-10-CM

## 2021-05-06 DIAGNOSIS — F0281 Dementia in other diseases classified elsewhere with behavioral disturbance: Secondary | ICD-10-CM

## 2021-05-06 NOTE — Progress Notes (Signed)
Location:  [facility] Room Number: 122-D Place of Service:  SNF (31)   CODE STATUS: DNR  Allergies  Allergen Reactions   Bee Venom Anaphylaxis   Ace Inhibitors    Angiotensin Receptor Blockers     Tongue swelling.   Aspirin     Directed by physician   Cabbage Other (See Comments)    Certain foods due to gout flares   Latex Rash    Chief Complaint  Patient presents with   Acute Visit    Care plan meeting     HPI:  We have come together for her care plan meeting. Family present  BIMS 9/15 mood 0/30.  She spends all of her time in bed per her choice. She requires extensive assist to dependent for her adls. She is incontinent of bladder and bowel. There have been no falls. She is able to feed herself. Dietary: weight is 122 pounds which is down 9 pounds in the past month; her weight is normally in the 130's. her appetite is poor.   Therapy none a this time.   She has transitioned to comfort care She continues to be followed for her chronic illnesses including: Chronic diastolic CHF (congestive heart failure) Chronic respiratory failure with hypoxia on home 02 Late onset alzheimer's disease with behavioral disturbance  Stage 3b chronic kidney disease  Past Medical History:  Diagnosis Date   Anxiety    Cellulitis 2016   CHF (congestive heart failure) (HCC)    Chronic kidney disease    Dementia (HCC)    Dysphagia    Fracture of radial neck, right, closed 08/06/2019   Gait abnormality    GERD (gastroesophageal reflux disease)    Glaucoma    Gout, unspecified 01/12/2010   Qualifier: Diagnosis of  By: Moshe Cipro MD, Margaret     Hyperlipidemia    Hypertension    Muscle weakness    Myocardial infarction ([place]) 1990s   [place]   Osteoarthritis    Syncope     Past Surgical History:  Procedure Laterality Date   APPENDECTOMY     CATARACT EXTRACTION W/PHACO  08/04/2011   Procedure: CATARACT EXTRACTION PHACO AND INTRAOCULAR LENS PLACEMENT ([place]);  Surgeon:  Tonny Branch;  Location: [facility];  Service: Ophthalmology;  Laterality: Right;  CDE=18.53   CATARACT EXTRACTION W/PHACO  08/25/2011   Procedure: CATARACT EXTRACTION PHACO AND INTRAOCULAR LENS PLACEMENT ([facility]);  Surgeon: Tonny Branch;  Location: [facility];  Service: Ophthalmology;  Laterality: Left;  CDE:14.76   TONSILLECTOMY      Social History   Socioeconomic History   Marital status: Divorced    Spouse name: Not on file   Number of children: Not on file   Years of education: Not on file   Highest education level: Not on file  Occupational History   Occupation: retired   Tobacco Use   Smoking status: Never   Smokeless tobacco: Never  Vaping Use   Vaping Use: Never used  Substance and Sexual Activity   Alcohol use: No   Drug use: No   Sexual activity: Not Currently  Other Topics Concern   Not on file  Social History Narrative   Long term resident [facility]    Social Determinants of Health   Financial Resource Strain: Not on file  Food Insecurity: Not on file  Transportation Needs: Not on file  Physical Activity: Not on file  Stress: Not on file  Social Connections: Not on file  Intimate Partner Violence: Not on file  Family History  Problem Relation Age of Onset   Diabetes Father    Diabetes Sister    Diabetes Brother    Stroke Sister    Stroke Sister    Anesthesia problems Neg Hx    Hypotension Neg Hx    Malignant hyperthermia Neg Hx    Pseudochol deficiency Neg Hx       VITAL SIGNS BP (!) 160/51   Pulse 60   Temp (!) 97.1 F (36.2 C)   Resp 18   Ht '4\' 11"'$  (1.499 m)   Wt 122 lb (55.3 kg)   SpO2 97%   BMI 24.64 kg/m   Outpatient Encounter Medications as of 05/06/2021  Medication Sig   acetaminophen (TYLENOL) 500 MG tablet Take 1,000 mg by mouth every 8 (eight) hours as needed. For leg pain and arthritis. May have additional 1000 mg for break through pain once a day prn   alum & mag hydroxide-simeth (MAALOX PLUS) 400-400-40 MG/5ML suspension Amt: 4 teaspoons;  oral Special Instructions: for indigestion/heartburn Twice A Day - PRN PRN 1, PRN 2   Balsam [place]-Castor Oil (VENELEX) OINT Apply topically to sacrum and bilateral buttocks every shift and as needed   Brinzolamide-Brimonidine 1-0.2 % SUSP One  drop into both eyes three times a day   citalopram (CELEXA) 10 MG tablet Take 30 mg by mouth daily.    cycloSPORINE (RESTASIS) 0.05 % ophthalmic emulsion Place 1 drop into both eyes 2 (two) times daily.    Dextromethorphan-guaiFENesin (ROBITUSSIN DM PO) 15 ml by mouth every 6 hours prn for cough/congestion. May give up to 48 hrs. Inform physician immediately if cough or congestion is associated with fever or SOB Every 6 Hours - PRN   docusate sodium (COLACE) 100 MG capsule Take 100 mg by mouth daily.    furosemide (LASIX) 40 MG tablet Take 40 mg by mouth daily.    hydrALAZINE (APRESOLINE) 50 MG tablet Take 50 mg by mouth 2 (two) times a day.    latanoprost (XALATAN) 0.005 % ophthalmic solution Place 1 drop into both eyes at bedtime.   loratadine (CLARITIN) 10 MG tablet Take 10 mg by mouth daily.   LORazepam (ATIVAN) 0.5 MG tablet Take 0.5 tablets (0.25 mg total) by mouth 2 (two) times daily.   nitroGLYCERIN (NITROSTAT) 0.4 MG SL tablet Place 0.4 mg under the tongue every 5 (five) minutes as needed for chest pain.   NON FORMULARY Diet Type:  Regular   OXYGEN Inhale 2 L/min into the lungs continuous. Wean to keep sats above 90%   pantoprazole (PROTONIX) 40 MG tablet Take 40 mg by mouth daily. Do not crush.   polyethylene glycol (MIRALAX / GLYCOLAX) 17 g packet Take 17 g by mouth daily.   potassium chloride SA (KLOR-CON) 20 MEQ tablet Take 20 mEq by mouth daily. DX hypokalemia   QUEtiapine (SEROQUEL) 25 MG tablet Take 12.5 mg by mouth 2 (two) times daily. Special Instructions: delusions; hallucinations   Alum & Mag Hydroxide-Simeth (MYLANTA PO) Take by mouth. amt: 4 tsp.; oral Special Instructions: after breakfast Once A Day 09:00 AM   [DISCONTINUED]  Calcium Carbonate-Vitamin D (CALCIUM-VITAMIN D) 500-200 MG-UNIT tablet Take 1 tablet by mouth 2 (two) times daily.   No facility-administered encounter medications on file as of 05/06/2021.     SIGNIFICANT DIAGNOSTIC EXAMS  PREVIOUS ;   03-21-20: chest x-ray: Mild congestive heart failure   NO NEW EXAMS.   LABS REVIEWED PREVIOUS:    03-30-20: glucose 88; bun 26; creat 1.33; k+  2.9; na++ 140 ca 9.0  03-31-20: k+ 3.6 07-08-20: wbc 4.3; hgb 10.9; hct 34.6; mcv 90.3 plt 190; glucose 89; bun 36; creat 1.42; k+ 3.8; na++ 142; ca 9.3 liver normal albumin 3.4 folate 14.7; vit B 12: 541 tsh 1.548 08-27-20: glucose 94; bun 30; creat 1.33; k+ 3.7; na++ 142; ca 9.1 GFR 37 08-31-20: glucose 127; bun 40; creat 1.33; k+ 4.1; na++ 138; ca 9.0; GFR 37  12-17-20: wbc 5.5; hgb 11.0; hct 34.1; mcv 89.3 plt 205; glucose 92; bun 51; creat 1.07; k+ 3.9; na++ 140; ca 9.3 GFR 48; liver normal albumin 3.0 vit B 12: 415 03-04-21: wbc 5.4; hgb 12.6; hct 38.6; mcv 87.9 plt 227; glucose 111; bun 35; creat 1.15; k+ 3.8; na++ 141; ca 9.3 GFR 43; liver normal albumin 3.4   NO NEW LABS.   Review of Systems  Unable to perform ROS: Dementia (unable to participate)   Physical Exam Constitutional:      General: She is not in acute distress.    Appearance: She is well-developed. She is not diaphoretic.  Neck:     Thyroid: No thyromegaly.  Cardiovascular:     Rate and Rhythm: Regular rhythm. Bradycardia present.     Pulses: Normal pulses.     Heart sounds: Normal heart sounds.  Pulmonary:     Effort: Pulmonary effort is normal. No respiratory distress.     Breath sounds: Normal breath sounds.  Abdominal:     General: Bowel sounds are normal. There is no distension.     Palpations: Abdomen is soft.     Tenderness: There is no abdominal tenderness.  Musculoskeletal:        General: Normal range of motion.     Cervical back: Neck supple.     Right lower leg: No edema.     Left lower leg: No edema.  Lymphadenopathy:      Cervical: No cervical adenopathy.  Skin:    General: Skin is warm and dry.     Comments:  Bilateral lower extremities discolored    Neurological:     Mental Status: She is alert. Mental status is at baseline.  Psychiatric:        Mood and Affect: Mood normal.      ASSESSMENT/ PLAN:  TODAY  Chronic diastolic CHF (congestive heart failure) Chronic respiratory failure with hypoxia on home 02 Late onset alzheimer's disease with behavioral disturbance Stage 3b chronic kidney disease  Will continue current medications Will continue current plan of care Will continue to monitor her status    Time spent with patient 40 minutes: goals of care; care plan medications.    Ok Edwards NP [facility]  Contact [phone] Monday through Friday 8am- 5pm  After hours call [phone]

## 2021-05-22 ENCOUNTER — Non-Acute Institutional Stay (SKILLED_NURSING_FACILITY): Payer: Medicare Other | Admitting: Adult Health

## 2021-05-22 ENCOUNTER — Encounter: Payer: Self-pay | Admitting: Adult Health

## 2021-05-22 DIAGNOSIS — K5909 Other constipation: Secondary | ICD-10-CM

## 2021-05-22 DIAGNOSIS — I5032 Chronic diastolic (congestive) heart failure: Secondary | ICD-10-CM

## 2021-05-22 DIAGNOSIS — N1832 Chronic kidney disease, stage 3b: Secondary | ICD-10-CM | POA: Diagnosis not present

## 2021-05-22 DIAGNOSIS — I82402 Acute embolism and thrombosis of unspecified deep veins of left lower extremity: Secondary | ICD-10-CM

## 2021-05-22 NOTE — Progress Notes (Signed)
Location:  [place] Room Number: 61 Place of Service:  SNF (31)   CODE STATUS: dnr   Allergies  Allergen Reactions   Bee Venom Anaphylaxis   Ace Inhibitors    Angiotensin Receptor Blockers     Tongue swelling.   Aspirin     Directed by physician   Cabbage Other (See Comments)    Certain foods due to gout flares   Latex Rash    Chief Complaint  Patient presents with   Medical Management of Chronic Issues         CKD (chronic kidney disease) stage 3 GFR 30-59:  Chronic constipation;  Chronic diastolic CHF (congestive heart failure) : Deep vein thrombosis (DVT) of left lower extremity unspecified chronicity unspecified vein    HPI:  She is a 87 year old long term resident of this facility being seen for the management of her chronic illnesses: CKD (chronic kidney disease) stage 3 GFR 30-59:  Chronic constipation;  Chronic diastolic CHF (congestive heart failure) : Deep vein thrombosis (DVT) of left lower extremity unspecified chronicity unspecified vein. There are n orertpos of uncontrolled pain. She will yell out on occasions. Thre are no report of constipation.   Past Medical History:  Diagnosis Date   Anxiety    Cellulitis 2016   CHF (congestive heart failure) (HCC)    Chronic kidney disease    Dementia (HCC)    Dysphagia    Fracture of radial neck, right, closed 08/06/2019   Gait abnormality    GERD (gastroesophageal reflux disease)    Glaucoma    Gout, unspecified 01/12/2010   Qualifier: Diagnosis of  By: Moshe Cipro MD, Margaret     Hyperlipidemia    Hypertension    Muscle weakness    Myocardial infarction ([place]) 1990s   [place]   Osteoarthritis    Syncope     Past Surgical History:  Procedure Laterality Date   APPENDECTOMY     CATARACT EXTRACTION W/PHACO  08/04/2011   Procedure: CATARACT EXTRACTION PHACO AND INTRAOCULAR LENS PLACEMENT ([place]);  Surgeon: Tonny Branch;  Location: [facility];  Service: Ophthalmology;  Laterality: Right;  CDE=18.53    CATARACT EXTRACTION W/PHACO  08/25/2011   Procedure: CATARACT EXTRACTION PHACO AND INTRAOCULAR LENS PLACEMENT ([facility]);  Surgeon: Tonny Branch;  Location: [facility];  Service: Ophthalmology;  Laterality: Left;  CDE:14.76   TONSILLECTOMY      Social History   Socioeconomic History   Marital status: Divorced    Spouse name: Not on file   Number of children: Not on file   Years of education: Not on file   Highest education level: Not on file  Occupational History   Occupation: retired   Tobacco Use   Smoking status: Never   Smokeless tobacco: Never  Vaping Use   Vaping Use: Never used  Substance and Sexual Activity   Alcohol use: No   Drug use: No   Sexual activity: Not Currently  Other Topics Concern   Not on file  Social History Narrative   Long term resident [facility]    Social Determinants of Health   Financial Resource Strain: Not on file  Food Insecurity: Not on file  Transportation Needs: Not on file  Physical Activity: Not on file  Stress: Not on file  Social Connections: Not on file  Intimate Partner Violence: Not on file   Family History  Problem Relation Age of Onset   Diabetes Father    Diabetes Sister    Diabetes Brother  Stroke Sister    Stroke Sister    Anesthesia problems Neg Hx    Hypotension Neg Hx    Malignant hyperthermia Neg Hx    Pseudochol deficiency Neg Hx       VITAL SIGNS BP (!) 144/62   Pulse 62   Temp 97.7 F (36.5 C)   Resp (!) 22   Ht 4\' 11"  (1.499 m)   Wt 122 lb (55.3 kg)   BMI 24.64 kg/m   Outpatient Encounter Medications as of 05/22/2021  Medication Sig   acetaminophen (TYLENOL) 500 MG tablet Take 1,000 mg by mouth every 8 (eight) hours as needed. For leg pain and arthritis. May have additional 1000 mg for break through pain once a day prn   alum & mag hydroxide-simeth (MAALOX PLUS) 400-400-40 MG/5ML suspension Amt: 4 teaspoons; oral Special Instructions: for indigestion/heartburn Twice A Day - PRN PRN 1, PRN 2   Alum & Mag  Hydroxide-Simeth (MYLANTA PO) Take by mouth. amt: 4 tsp.; oral Special Instructions: after breakfast Once A Day 09:00 AM   Balsam [place]-Castor Oil (VENELEX) OINT Apply topically to sacrum and bilateral buttocks every shift and as needed   Brinzolamide-Brimonidine 1-0.2 % SUSP One  drop into both eyes three times a day   citalopram (CELEXA) 10 MG tablet Take 30 mg by mouth daily.    cycloSPORINE (RESTASIS) 0.05 % ophthalmic emulsion Place 1 drop into both eyes 2 (two) times daily.    Dextromethorphan-guaiFENesin (ROBITUSSIN DM PO) 15 ml by mouth every 6 hours prn for cough/congestion. May give up to 48 hrs. Inform physician immediately if cough or congestion is associated with fever or SOB Every 6 Hours - PRN   docusate sodium (COLACE) 100 MG capsule Take 100 mg by mouth daily.    furosemide (LASIX) 40 MG tablet Take 40 mg by mouth daily.    hydrALAZINE (APRESOLINE) 50 MG tablet Take 50 mg by mouth 2 (two) times a day.    latanoprost (XALATAN) 0.005 % ophthalmic solution Place 1 drop into both eyes at bedtime.   loratadine (CLARITIN) 10 MG tablet Take 10 mg by mouth daily.   LORazepam (ATIVAN) 0.5 MG tablet Take 0.5 tablets (0.25 mg total) by mouth 2 (two) times daily.   nitroGLYCERIN (NITROSTAT) 0.4 MG SL tablet Place 0.4 mg under the tongue every 5 (five) minutes as needed for chest pain.   NON FORMULARY Diet Type:  Regular   OXYGEN Inhale 2 L/min into the lungs continuous. Wean to keep sats above 90%   pantoprazole (PROTONIX) 40 MG tablet Take 40 mg by mouth daily. Do not crush.   polyethylene glycol (MIRALAX / GLYCOLAX) 17 g packet Take 17 g by mouth daily.   potassium chloride SA (KLOR-CON) 20 MEQ tablet Take 20 mEq by mouth daily. DX hypokalemia   QUEtiapine (SEROQUEL) 25 MG tablet Take 12.5 mg by mouth 2 (two) times daily. Special Instructions: delusions; hallucinations   No facility-administered encounter medications on file as of 05/22/2021.     SIGNIFICANT DIAGNOSTIC  EXAMS   PREVIOUS ;   03-21-20: chest x-ray: Mild congestive heart failure   NO NEW EXAMS.   LABS REVIEWED PREVIOUS:    03-30-20: glucose 88; bun 26; creat 1.33; k+ 2.9; na++ 140 ca 9.0  03-31-20: k+ 3.6 07-08-20: wbc 4.3; hgb 10.9; hct 34.6; mcv 90.3 plt 190; glucose 89; bun 36; creat 1.42; k+ 3.8; na++ 142; ca 9.3 liver normal albumin 3.4 folate 14.7; vit B 12: 541 tsh 1.548 08-27-20: glucose 94; bun  30; creat 1.33; k+ 3.7; na++ 142; ca 9.1 GFR 37 08-31-20: glucose 127; bun 40; creat 1.33; k+ 4.1; na++ 138; ca 9.0; GFR 37  12-17-20: wbc 5.5; hgb 11.0; hct 34.1; mcv 89.3 plt 205; glucose 92; bun 51; creat 1.07; k+ 3.9; na++ 140; ca 9.3 GFR 48; liver normal albumin 3.0 vit B 12: 415 03-04-21: wbc 5.4; hgb 12.6; hct 38.6; mcv 87.9 plt 227; glucose 111; bun 35; creat 1.15; k+ 3.8; na++ 141; ca 9.3 GFR 43; liver normal albumin 3.4   NO NEW LABS.  Review of Systems  Unable to perform ROS: Dementia (unable to participate)    Physical Exam Constitutional:      General: She is not in acute distress.    Appearance: She is well-developed. She is not diaphoretic.  Neck:     Thyroid: No thyromegaly.  Cardiovascular:     Rate and Rhythm: Regular rhythm. Bradycardia present.     Pulses: Normal pulses.     Heart sounds: Normal heart sounds.  Pulmonary:     Effort: Pulmonary effort is normal. No respiratory distress.     Breath sounds: Normal breath sounds.  Abdominal:     General: Bowel sounds are normal. There is no distension.     Palpations: Abdomen is soft.     Tenderness: There is no abdominal tenderness.  Musculoskeletal:        General: Normal range of motion.     Cervical back: Neck supple.     Right lower leg: No edema.     Left lower leg: No edema.  Lymphadenopathy:     Cervical: No cervical adenopathy.  Skin:    General: Skin is warm and dry.     Comments:  Bilateral lower extremities discolored     Neurological:     Mental Status: She is alert. Mental status is at baseline.   Psychiatric:        Mood and Affect: Mood normal.     ASSESSMENT/ PLAN:   TODAY:   CKD (chronic kidney disease) stage # GFR 30-59: is stable bun 35; creat 1.15; GFR 43  2. Chronic constipation; is stable will continue colace twice daily   3. Chronic diastolic CHF (congestive heart failure) : EF 65-70% (03-04-18) is stable will continue apresoline 50 mg twice daily lasix 40 mg daily has prn ntg. continue to monitor her status.   4. Deep vein thrombosis (DVT) of left lower extremity unspecified chronicity unspecified vein: is stable will monitor her status.   PREVIOUS   5. Chronic non-seasonal allergic rhinitis: is stable will continue claritin 10 mg daily   6. Hypertensive heart and kidney disease with chronic diastolic congestive heart failure and stage 3 chronic kidney disease: is stable b/p 138/79 (repeated) will continue apresoline 50 mg twice daily lasix 40 mg daily   7. GERD without esophagitis: is stable will continue protonix 40 mg daily   8. Chronic respiratory failure with hypoxia on home 02 therapy: is stable is on 02 will continue claritin 10 mg daily   9. Increased intraocular pressure bilateral: is stable will continue simbrinza three times daily travatan nightly to both eyes.   10. Hypokalemia: k+ 3.8 will continue k+ 20 meq daily   11. Primary osteoarthritis multiple joints: is stable is taking tylenol 1 gm twice daily   12. Late onset alzheimer's disease with behavioral disturbance: is without change is off aricept; weight is stable at 133 pounds.   13. Major depression with psychotic features is stable will  continue celexa 30 mg daily (failed one wean); ativan 0.25 mg twice daily (has failed 2 weans) will continue seroquel 12.5 mg twice daily     Ok Edwards NP [facility]  Contact [phone] Monday through Friday 8am- 5pm  After hours call [phone]

## 2021-05-31 ENCOUNTER — Other Ambulatory Visit: Payer: Self-pay | Admitting: Adult Health

## 2021-05-31 MED ORDER — LORAZEPAM 0.5 MG PO TABS: 0.2500 mg | ORAL_TABLET | Freq: Two times a day (BID) | ORAL | 0 refills | Status: DC

## 2021-06-17 ENCOUNTER — Non-Acute Institutional Stay (SKILLED_NURSING_FACILITY): Payer: Medicare Other | Admitting: Adult Health

## 2021-06-17 ENCOUNTER — Encounter: Payer: Self-pay | Admitting: Adult Health

## 2021-06-17 DIAGNOSIS — N1832 Chronic kidney disease, stage 3b: Secondary | ICD-10-CM

## 2021-06-17 DIAGNOSIS — K5909 Other constipation: Secondary | ICD-10-CM

## 2021-06-17 DIAGNOSIS — J9611 Chronic respiratory failure with hypoxia: Secondary | ICD-10-CM

## 2021-06-17 DIAGNOSIS — I5032 Chronic diastolic (congestive) heart failure: Secondary | ICD-10-CM

## 2021-06-17 DIAGNOSIS — F03918 Unspecified dementia, unspecified severity, with other behavioral disturbance: Secondary | ICD-10-CM

## 2021-06-17 DIAGNOSIS — F323 Major depressive disorder, single episode, severe with psychotic features: Secondary | ICD-10-CM

## 2021-06-17 DIAGNOSIS — J3089 Other allergic rhinitis: Secondary | ICD-10-CM

## 2021-06-17 DIAGNOSIS — Z9981 Dependence on supplemental oxygen: Secondary | ICD-10-CM

## 2021-06-17 DIAGNOSIS — I82402 Acute embolism and thrombosis of unspecified deep veins of left lower extremity: Secondary | ICD-10-CM | POA: Diagnosis not present

## 2021-06-17 DIAGNOSIS — M170 Bilateral primary osteoarthritis of knee: Secondary | ICD-10-CM

## 2021-06-17 DIAGNOSIS — I130 Hypertensive heart and chronic kidney disease with heart failure and stage 1 through stage 4 chronic kidney disease, or unspecified chronic kidney disease: Secondary | ICD-10-CM

## 2021-06-17 DIAGNOSIS — E876 Hypokalemia: Secondary | ICD-10-CM

## 2021-06-17 DIAGNOSIS — K219 Gastro-esophageal reflux disease without esophagitis: Secondary | ICD-10-CM

## 2021-06-17 DIAGNOSIS — H40053 Ocular hypertension, bilateral: Secondary | ICD-10-CM

## 2021-06-17 NOTE — Progress Notes (Signed)
Provider:Imogen Maddalena Nyoka Cowden NP  Location  [facility]   PCP: Gerlene Fee, NP  Extended Emergency Contact Information Primary Emergency Contact: [name], [place] [address] [place] Phone: [phone] Relation: Daughter Secondary Emergency Contact: [name] [place] Phone: [phone] Relation: Son  Codes status: DNR Goals of care: advanced directive information Advanced Directives 05/06/2021  Does Patient Have a Medical Advance Directive? Yes  Type of Advance Directive Out of facility DNR (pink MOST or yellow form)  Does patient want to make changes to medical advance directive? No - Patient declined  Copy of [place] in Chart? -  Would patient like information on creating a medical advance directive? -  Pre-existing out of facility DNR order (yellow form or pink MOST form) Yellow form placed in chart (order not valid for inpatient use);Pink MOST form placed in chart (order not valid for inpatient use)     Allergies  Allergen Reactions   Bee Venom Anaphylaxis   Ace Inhibitors    Angiotensin Receptor Blockers     Tongue swelling.   Aspirin     Directed by physician   Cabbage Other (See Comments)    Certain foods due to gout flares   Latex Rash    Chief Complaint  Patient presents with   Annual Exam    HPI  She is a 87 year old long term resident of this facility begin seen for her annual exam. She has declined over the past year. She has not  been hospitalized or visited the ED over the past year. The focus of her care is comfort. Her blood pressure is very high in the 149'F systolic. There are no reports of uncontrolled pain. Her weight is currently stable at 111 pounds. She continues to be followed for her chronic illnesses including:   Chronic non-seasonal allergic rhinitis:  Hypertensive heart and kidney disease with chronic diastolic congestive heart failure and stage 3  chronic kidney disease:  GERD without esophagitis:  Chronic respiratory failure with hypoxia on home 02 therapy    Past Medical History:  Diagnosis Date   Anxiety    Cellulitis 2016   CHF (congestive heart failure) (HCC)    Chronic kidney disease    Dementia (HCC)    Dysphagia    Fracture of radial neck, right, closed 08/06/2019   Gait abnormality    GERD (gastroesophageal reflux disease)    Glaucoma    Gout, unspecified 01/12/2010   Qualifier: Diagnosis of  By: Moshe Cipro MD, Margaret     Hyperlipidemia    Hypertension    Muscle weakness    Myocardial infarction ([place]) 1990s   [place]   Osteoarthritis    Syncope    Past Surgical History:  Procedure Laterality Date   APPENDECTOMY     CATARACT EXTRACTION W/PHACO  08/04/2011   Procedure: CATARACT EXTRACTION PHACO AND INTRAOCULAR LENS PLACEMENT ([place]);  Surgeon: Tonny Branch;  Location: [facility];  Service: Ophthalmology;  Laterality: Right;  CDE=18.53   CATARACT EXTRACTION W/PHACO  08/25/2011   Procedure: CATARACT EXTRACTION PHACO AND INTRAOCULAR LENS PLACEMENT ([facility]);  Surgeon: Tonny Branch;  Location: [facility];  Service: Ophthalmology;  Laterality: Left;  CDE:14.76   TONSILLECTOMY      reports that she has never smoked. She has never used smokeless tobacco. She reports that she does not drink alcohol and does not use drugs. Social History   Tobacco Use  Smoking status: Never   Smokeless tobacco: Never  Vaping Use   Vaping Use: Never used  Substance Use Topics   Alcohol use: No   Drug use: No   Family History  Problem Relation Age of Onset   Diabetes Father    Diabetes Sister    Diabetes Brother    Stroke Sister    Stroke Sister    Anesthesia problems Neg Hx    Hypotension Neg Hx    Malignant hyperthermia Neg Hx    Pseudochol deficiency Neg Hx     Pertinent  Health Maintenance Due  Topic Date Due   INFLUENZA VACCINE  03/29/2021   DEXA SCAN  Completed   Fall Risk  12/21/2020 12/20/2019 07/24/2019 06/09/2019 05/31/2018   Falls in the past year? 1 1 1  0 No  Number falls in past yr: 0 0 1 - -  Injury with Fall? 0 1 1 - -  Risk for fall due to : History of fall(s);Impaired balance/gait;Impaired mobility History of fall(s);Impaired balance/gait;Impaired mobility History of fall(s);Impaired balance/gait;Impaired mobility - -  Follow up Falls evaluation completed Falls evaluation completed Falls evaluation completed - -   Depression screen [facility] 2/9 12/21/2020 12/20/2019 07/24/2019 06/09/2019 05/31/2018  Decreased Interest 0 0 0 0 0  Down, Depressed, Hopeless 0 0 0 0 0  PHQ - 2 Score 0 0 0 0 0  Some recent data might be hidden    Functional Status Survey:    Outpatient Encounter Medications as of 06/17/2021  Medication Sig   acetaminophen (TYLENOL) 500 MG tablet Take 1,000 mg by mouth every 8 (eight) hours as needed. For leg pain and arthritis. May have additional 1000 mg for break through pain once a day prn   alum & mag hydroxide-simeth (MAALOX PLUS) 400-400-40 MG/5ML suspension Amt: 4 teaspoons; oral Special Instructions: for indigestion/heartburn Twice A Day - PRN PRN 1, PRN 2   Alum & Mag Hydroxide-Simeth (MYLANTA PO) Take by mouth. amt: 4 tsp.; oral Special Instructions: after breakfast Once A Day 09:00 AM   Balsam [place]-Castor Oil (VENELEX) OINT Apply topically to sacrum and bilateral buttocks every shift and as needed   Brinzolamide-Brimonidine 1-0.2 % SUSP One  drop into both eyes three times a day   citalopram (CELEXA) 10 MG tablet Take 30 mg by mouth daily.    cycloSPORINE (RESTASIS) 0.05 % ophthalmic emulsion Place 1 drop into both eyes 2 (two) times daily.    Dextromethorphan-guaiFENesin (ROBITUSSIN DM PO) 15 ml by mouth every 6 hours prn for cough/congestion. May give up to 48 hrs. Inform physician immediately if cough or congestion is associated with fever or SOB Every 6 Hours - PRN   docusate sodium (COLACE) 100 MG capsule Take 100 mg by mouth daily.    furosemide (LASIX) 40 MG tablet Take 40  mg by mouth daily.    hydrALAZINE (APRESOLINE) 50 MG tablet Take 50 mg by mouth 2 (two) times a day.    latanoprost (XALATAN) 0.005 % ophthalmic solution Place 1 drop into both eyes at bedtime.   loratadine (CLARITIN) 10 MG tablet Take 10 mg by mouth daily.   LORazepam (ATIVAN) 0.5 MG tablet Take 0.5 tablets (0.25 mg total) by mouth 2 (two) times daily.   nitroGLYCERIN (NITROSTAT) 0.4 MG SL tablet Place 0.4 mg under the tongue every 5 (five) minutes as needed for chest pain.   NON FORMULARY Diet Type:  Regular   OXYGEN Inhale 2 L/min into the lungs continuous. Wean to keep sats above 90%  pantoprazole (PROTONIX) 40 MG tablet Take 40 mg by mouth daily. Do not crush.   polyethylene glycol (MIRALAX / GLYCOLAX) 17 g packet Take 17 g by mouth daily.   potassium chloride SA (KLOR-CON) 20 MEQ tablet Take 20 mEq by mouth daily. DX hypokalemia   QUEtiapine (SEROQUEL) 25 MG tablet Take 12.5 mg by mouth 2 (two) times daily. Special Instructions: delusions; hallucinations   No facility-administered encounter medications on file as of 06/17/2021.     Vitals:   06/17/21 1213  BP: (!) 186/87  Pulse: 70  Temp: 98.2 F (36.8 C)  Weight: 110 lb 12.8 oz (50.3 kg)  Height: 4\' 11"  (1.499 m)   Body mass index is 22.38 kg/m.  DIAGNOSTIC EXAMS  NO NEW EXAMS.   LABS REVIEWED PREVIOUS:    03-30-20: glucose 88; bun 26; creat 1.33; k+ 2.9; na++ 140 ca 9.0  03-31-20: k+ 3.6 07-08-20: wbc 4.3; hgb 10.9; hct 34.6; mcv 90.3 plt 190; glucose 89; bun 36; creat 1.42; k+ 3.8; na++ 142; ca 9.3 liver normal albumin 3.4 folate 14.7; vit B 12: 541 tsh 1.548 08-27-20: glucose 94; bun 30; creat 1.33; k+ 3.7; na++ 142; ca 9.1 GFR 37 08-31-20: glucose 127; bun 40; creat 1.33; k+ 4.1; na++ 138; ca 9.0; GFR 37  12-17-20: wbc 5.5; hgb 11.0; hct 34.1; mcv 89.3 plt 205; glucose 92; bun 51; creat 1.07; k+ 3.9; na++ 140; ca 9.3 GFR 48; liver normal albumin 3.0 vit B 12: 415 03-04-21: wbc 5.4; hgb 12.6; hct 38.6; mcv 87.9 plt 227;  glucose 111; bun 35; creat 1.15; k+ 3.8; na++ 141; ca 9.3 GFR 43; liver normal albumin 3.4   NO NEW LABS.  Review of Systems  Unable to perform ROS: Dementia (unable to particiapte)   Physical Exam Constitutional:      General: She is not in acute distress.    Appearance: She is well-developed. She is not diaphoretic.  HENT:     Nose: Nose normal.     Mouth/Throat:     Mouth: Mucous membranes are moist.     Pharynx: Oropharynx is clear.  Eyes:     Conjunctiva/sclera: Conjunctivae normal.  Neck:     Thyroid: No thyromegaly.  Cardiovascular:     Rate and Rhythm: Regular rhythm. Bradycardia present.     Pulses: Normal pulses.     Heart sounds: Normal heart sounds.  Pulmonary:     Effort: Pulmonary effort is normal. No respiratory distress.     Breath sounds: Normal breath sounds.  Abdominal:     General: Bowel sounds are normal. There is no distension.     Palpations: Abdomen is soft.     Tenderness: There is no abdominal tenderness.  Musculoskeletal:     Cervical back: Neck supple.     Right lower leg: No edema.     Left lower leg: No edema.     Comments: Is able to move all extremities   Lymphadenopathy:     Cervical: No cervical adenopathy.  Skin:    General: Skin is warm and dry.     Comments: Bilateral lower extremities discolored   Neurological:     Mental Status: She is alert. Mental status is at baseline.  Psychiatric:        Mood and Affect: Mood normal.     ASSESSMENT/ PLAN:  TODAY:   Chronic non-seasonal allergic rhinitis: is stable will continue claritin 10 mg daily   2. Hypertensive heart and kidney disease with chronic diastolic congestive heart failure  and stage 3 chronic kidney disease: is worse: b/p 186/87: will continue lasix 40 mg daily will increase to hydralazine 50 mg three times daily   3. GERD without esophagitis: is stable will continue protonix 40 mg daily   4. Chronic respiratory failure with hypoxia on home 02 therapy: is stable on  chronic 02 therapy  5. Increased intraocular pressure bilateral: is stable will continue simbrinza three items daily travatan nightly to both eyes  6.  Hypokalemia k+ 3.8 will continue k+ 20 meq daily   7. Primary osteoarthritis multiple joints: is stable will continue tylenol 1 gm twice daily   8. Late onset alzheimer's disease with behavioral disturbance: is without change: is off aricept she is slowly losing weight is at 111 pounds   9. Major depression with psychotic features is stable will continue celexa 20 mg daily (failed one wean) ativan 0.25 mg twice daily (failed 2 weans) will continue seroquel 12.5 mg twice daily   10. CKD (chronic kidney disease) stage 3 GFR 30-59: is stable bun 35; creat 1.15; GFR 43  11. Chronic constipation: will continue colace twice daily   12. Chronic diastolic CHF (congestive heart failure) EF 65-70% (03-04-18) is stable will continue lasix 40 mg daily prn ntg; will increase apresoline to 50 mg three times daily   13. Deep vein thrombosis (DVT) of left lower extremity unspecified chronicity unspecified veing: is stable will monitor       Ok Edwards NP [facility]  Contact [phone] Monday through Friday 8am- 5pm  After hours call [phone]

## 2021-06-24 ENCOUNTER — Non-Acute Institutional Stay (SKILLED_NURSING_FACILITY): Payer: Medicare Other | Admitting: Adult Health

## 2021-06-24 ENCOUNTER — Other Ambulatory Visit: Payer: Self-pay | Admitting: Adult Health

## 2021-06-24 ENCOUNTER — Encounter: Payer: Self-pay | Admitting: Adult Health

## 2021-06-24 DIAGNOSIS — I5032 Chronic diastolic (congestive) heart failure: Secondary | ICD-10-CM | POA: Diagnosis not present

## 2021-06-24 DIAGNOSIS — F03918 Unspecified dementia, unspecified severity, with other behavioral disturbance: Secondary | ICD-10-CM | POA: Diagnosis not present

## 2021-06-24 DIAGNOSIS — R627 Adult failure to thrive: Secondary | ICD-10-CM | POA: Diagnosis not present

## 2021-06-24 MED ORDER — MORPHINE SULFATE (CONCENTRATE) 20 MG/ML PO SOLN: 5.0000 mg | ORAL | 0 refills | Status: AC | PRN

## 2021-06-24 NOTE — Progress Notes (Signed)
Location:   [facility] Room Number: 122 Place of Service:   SNF    CODE STATUS: dnr  Allergies  Allergen Reactions   Bee Venom Anaphylaxis   Ace Inhibitors    Angiotensin Receptor Blockers     Tongue swelling.   Aspirin     Directed by physician   Cabbage Other (See Comments)    Certain foods due to gout flares   Latex Rash    Chief Complaint  Patient presents with   Acute Visit    Status change     HPI:  She continues to slowly decline. Today staff feels as though she looks uncomfortable. She did speak to me today and told me that she feels "ok".  She did deny any pain. She is having increased difficulty swallowing her medications. Her po intake is poor.   Past Medical History:  Diagnosis Date   Anxiety    Cellulitis 2016   CHF (congestive heart failure) (HCC)    Chronic kidney disease    Dementia (HCC)    Dysphagia    Fracture of radial neck, right, closed 08/06/2019   Gait abnormality    GERD (gastroesophageal reflux disease)    Glaucoma    Gout, unspecified 01/12/2010   Qualifier: Diagnosis of  By: Moshe Cipro MD, Margaret     Hyperlipidemia    Hypertension    Muscle weakness    Myocardial infarction ([place]) 1990s   [place]   Osteoarthritis    Syncope     Past Surgical History:  Procedure Laterality Date   APPENDECTOMY     CATARACT EXTRACTION W/PHACO  08/04/2011   Procedure: CATARACT EXTRACTION PHACO AND INTRAOCULAR LENS PLACEMENT ([place]);  Surgeon: Tonny Branch;  Location: [facility];  Service: Ophthalmology;  Laterality: Right;  CDE=18.53   CATARACT EXTRACTION W/PHACO  08/25/2011   Procedure: CATARACT EXTRACTION PHACO AND INTRAOCULAR LENS PLACEMENT ([facility]);  Surgeon: Tonny Branch;  Location: [facility];  Service: Ophthalmology;  Laterality: Left;  CDE:14.76   TONSILLECTOMY      Social History   Socioeconomic History   Marital status: Divorced    Spouse name: Not on file   Number of children: Not on file   Years of education: Not on file    Highest education level: Not on file  Occupational History   Occupation: retired   Tobacco Use   Smoking status: Never   Smokeless tobacco: Never  Vaping Use   Vaping Use: Never used  Substance and Sexual Activity   Alcohol use: No   Drug use: No   Sexual activity: Not Currently  Other Topics Concern   Not on file  Social History Narrative   Long term resident [facility]    Social Determinants of Health   Financial Resource Strain: Not on file  Food Insecurity: Not on file  Transportation Needs: Not on file  Physical Activity: Not on file  Stress: Not on file  Social Connections: Not on file  Intimate Partner Violence: Not on file   Family History  Problem Relation Age of Onset   Diabetes Father    Diabetes Sister    Diabetes Brother    Stroke Sister    Stroke Sister    Anesthesia problems Neg Hx    Hypotension Neg Hx    Malignant hyperthermia Neg Hx    Pseudochol deficiency Neg Hx       VITAL SIGNS BP 122/64   Pulse 69   Temp 97.8 F (36.6 C)   Ht  4\' 11"  (1.499 m)   Wt 110 lb 12.8 oz (50.3 kg)   BMI 22.38 kg/m   Outpatient Encounter Medications as of 06/24/2021  Medication Sig   acetaminophen (TYLENOL) 500 MG tablet Take 1,000 mg by mouth every 8 (eight) hours as needed. For leg pain and arthritis. May have additional 1000 mg for break through pain once a day prn   alum & mag hydroxide-simeth (MAALOX PLUS) 400-400-40 MG/5ML suspension Amt: 4 teaspoons; oral Special Instructions: for indigestion/heartburn Twice A Day - PRN PRN 1, PRN 2   Alum & Mag Hydroxide-Simeth (MYLANTA PO) Take by mouth. amt: 4 tsp.; oral Special Instructions: after breakfast Once A Day 09:00 AM   Balsam [place]-Castor Oil (VENELEX) OINT Apply topically to sacrum and bilateral buttocks every shift and as needed   Brinzolamide-Brimonidine 1-0.2 % SUSP One  drop into both eyes three times a day   citalopram (CELEXA) 10 MG tablet Take 30 mg by mouth daily.    cycloSPORINE (RESTASIS) 0.05 %  ophthalmic emulsion Place 1 drop into both eyes 2 (two) times daily.    Dextromethorphan-guaiFENesin (ROBITUSSIN DM PO) 15 ml by mouth every 6 hours prn for cough/congestion. May give up to 48 hrs. Inform physician immediately if cough or congestion is associated with fever or SOB Every 6 Hours - PRN   docusate sodium (COLACE) 100 MG capsule Take 100 mg by mouth daily.    furosemide (LASIX) 40 MG tablet Take 40 mg by mouth daily.    hydrALAZINE (APRESOLINE) 50 MG tablet Take 50 mg by mouth 2 (two) times a day.    latanoprost (XALATAN) 0.005 % ophthalmic solution Place 1 drop into both eyes at bedtime.   loratadine (CLARITIN) 10 MG tablet Take 10 mg by mouth daily.   LORazepam (ATIVAN) 0.5 MG tablet Take 0.5 tablets (0.25 mg total) by mouth 2 (two) times daily.   morphine (ROXANOL) 20 MG/ML concentrated solution Take 0.25 mLs (5 mg total) by mouth every 4 (four) hours as needed for severe pain.   nitroGLYCERIN (NITROSTAT) 0.4 MG SL tablet Place 0.4 mg under the tongue every 5 (five) minutes as needed for chest pain.   NON FORMULARY Diet Type:  Regular   OXYGEN Inhale 2 L/min into the lungs continuous. Wean to keep sats above 90%   pantoprazole (PROTONIX) 40 MG tablet Take 40 mg by mouth daily. Do not crush.   polyethylene glycol (MIRALAX / GLYCOLAX) 17 g packet Take 17 g by mouth daily.   potassium chloride SA (KLOR-CON) 20 MEQ tablet Take 20 mEq by mouth daily. DX hypokalemia   QUEtiapine (SEROQUEL) 25 MG tablet Take 12.5 mg by mouth 2 (two) times daily. Special Instructions: delusions; hallucinations   No facility-administered encounter medications on file as of 06/24/2021.     SIGNIFICANT DIAGNOSTIC EXAMS  NO NEW EXAMS.   LABS REVIEWED PREVIOUS:    07-08-20: wbc 4.3; hgb 10.9; hct 34.6; mcv 90.3 plt 190; glucose 89; bun 36; creat 1.42; k+ 3.8; na++ 142; ca 9.3 liver normal albumin 3.4 folate 14.7; vit B 12: 541 tsh 1.548 08-27-20: glucose 94; bun 30; creat 1.33; k+ 3.7; na++ 142; ca  9.1 GFR 37 08-31-20: glucose 127; bun 40; creat 1.33; k+ 4.1; na++ 138; ca 9.0; GFR 37  12-17-20: wbc 5.5; hgb 11.0; hct 34.1; mcv 89.3 plt 205; glucose 92; bun 51; creat 1.07; k+ 3.9; na++ 140; ca 9.3 GFR 48; liver normal albumin 3.0 vit B 12: 415 03-04-21: wbc 5.4; hgb 12.6; hct 38.6; mcv  87.9 plt 227; glucose 111; bun 35; creat 1.15; k+ 3.8; na++ 141; ca 9.3 GFR 43; liver normal albumin 3.4   NO NEW LABS  Review of Systems  Unable to perform ROS: Dementia (unable to participate)    Physical Exam Constitutional:      General: She is not in acute distress.    Appearance: She is well-developed. She is not diaphoretic.     Comments: thin  Neck:     Thyroid: No thyromegaly.  Cardiovascular:     Rate and Rhythm: Normal rate and regular rhythm.     Pulses: Normal pulses.     Heart sounds: Normal heart sounds.  Pulmonary:     Effort: Pulmonary effort is normal. No respiratory distress.     Breath sounds: Normal breath sounds.  Abdominal:     General: Bowel sounds are normal. There is no distension.     Palpations: Abdomen is soft.     Tenderness: There is no abdominal tenderness.  Musculoskeletal:     Cervical back: Neck supple.     Right lower leg: No edema.     Left lower leg: No edema.     Comments: Is able to move all extremities  Lymphadenopathy:     Cervical: No cervical adenopathy.  Skin:    General: Skin is warm and dry.     Comments: Bilateral lower extremities discolored   Neurological:     Mental Status: She is alert. Mental status is at baseline.  Psychiatric:        Mood and Affect: Mood normal.     ASSESSMENT/ PLAN:  TODAY  Chronic diastolic congestive heart failure Dementia with behavioral disturbance Failure to thrive in adult   Will stop the following medications: claritin; celexa; lasix; hydralazine Will begin roxanol 5 mg every 4 hours as needed for pain ir distress     Ok Edwards NP [facility]  Contact [phone] Monday through  Friday 8am- 5pm  After hours call [phone]

## 2021-06-29 ENCOUNTER — Other Ambulatory Visit: Payer: Self-pay | Admitting: Adult Health

## 2021-06-29 MED ORDER — LORAZEPAM 0.5 MG PO TABS: 0.2500 mg | ORAL_TABLET | Freq: Two times a day (BID) | ORAL | 0 refills | Status: AC

## 2021-07-29 DEATH — deceased
